# Patient Record
Sex: Male | Born: 1945
Health system: Southern US, Community
[De-identification: ages and names within clinical notes are randomized; demographics above are authoritative.]

## PROBLEM LIST (undated history)

## (undated) DIAGNOSIS — E8809 Other disorders of plasma-protein metabolism, not elsewhere classified: Secondary | ICD-10-CM

## (undated) DIAGNOSIS — E079 Disorder of thyroid, unspecified: Secondary | ICD-10-CM

## (undated) DIAGNOSIS — T7840XA Allergy, unspecified, initial encounter: Secondary | ICD-10-CM

## (undated) DIAGNOSIS — I341 Nonrheumatic mitral (valve) prolapse: Secondary | ICD-10-CM

## (undated) DIAGNOSIS — I480 Paroxysmal atrial fibrillation: Secondary | ICD-10-CM

## (undated) DIAGNOSIS — M1990 Unspecified osteoarthritis, unspecified site: Secondary | ICD-10-CM

## (undated) DIAGNOSIS — F32A Depression, unspecified: Secondary | ICD-10-CM

## (undated) DIAGNOSIS — E039 Hypothyroidism, unspecified: Secondary | ICD-10-CM

## (undated) DIAGNOSIS — E785 Hyperlipidemia, unspecified: Secondary | ICD-10-CM

## (undated) DIAGNOSIS — C9000 Multiple myeloma not having achieved remission: Secondary | ICD-10-CM

## (undated) DIAGNOSIS — I10 Essential (primary) hypertension: Secondary | ICD-10-CM

## (undated) DIAGNOSIS — N289 Disorder of kidney and ureter, unspecified: Secondary | ICD-10-CM

## (undated) DIAGNOSIS — F329 Major depressive disorder, single episode, unspecified: Secondary | ICD-10-CM

## (undated) DIAGNOSIS — R011 Cardiac murmur, unspecified: Secondary | ICD-10-CM

## (undated) DIAGNOSIS — K635 Polyp of colon: Secondary | ICD-10-CM

## (undated) DIAGNOSIS — F419 Anxiety disorder, unspecified: Secondary | ICD-10-CM

## (undated) HISTORY — DX: Hyperlipidemia, unspecified: E78.5

## (undated) HISTORY — DX: Polyp of colon: K63.5

## (undated) HISTORY — DX: Cardiac murmur, unspecified: R01.1

## (undated) HISTORY — DX: Multiple myeloma not having achieved remission: C90.00

## (undated) HISTORY — DX: Allergy, unspecified, initial encounter: T78.40XA

## (undated) HISTORY — DX: Other disorders of plasma-protein metabolism, not elsewhere classified: E88.09

## (undated) HISTORY — DX: Anxiety disorder, unspecified: F41.9

## (undated) HISTORY — DX: Disorder of thyroid, unspecified: E07.9

## (undated) HISTORY — DX: Unspecified osteoarthritis, unspecified site: M19.90

## (undated) HISTORY — DX: Essential (primary) hypertension: I10

## (undated) HISTORY — DX: Hypothyroidism, unspecified: E03.9

## (undated) HISTORY — DX: Paroxysmal atrial fibrillation: I48.0

## (undated) HISTORY — DX: Major depressive disorder, single episode, unspecified: F32.9

## (undated) HISTORY — DX: Depression, unspecified: F32.A

## (undated) HISTORY — DX: Disorder of kidney and ureter, unspecified: N28.9

## (undated) HISTORY — DX: Nonrheumatic mitral (valve) prolapse: I34.1

---

## 2001-01-05 ENCOUNTER — Emergency Department (HOSPITAL_COMMUNITY): Admission: EM | Admit: 2001-01-05 | Discharge: 2001-01-05 | Payer: Self-pay | Admitting: *Deleted

## 2007-09-15 ENCOUNTER — Ambulatory Visit: Payer: Self-pay | Admitting: Internal Medicine

## 2007-09-28 ENCOUNTER — Encounter: Payer: Self-pay | Admitting: Internal Medicine

## 2007-09-28 ENCOUNTER — Ambulatory Visit: Payer: Self-pay | Admitting: Internal Medicine

## 2008-08-30 ENCOUNTER — Ambulatory Visit: Payer: Self-pay | Admitting: Radiology

## 2008-08-30 ENCOUNTER — Inpatient Hospital Stay (HOSPITAL_COMMUNITY): Admission: AD | Admit: 2008-08-30 | Discharge: 2008-08-31 | Payer: Self-pay | Admitting: Internal Medicine

## 2008-08-30 ENCOUNTER — Encounter: Payer: Self-pay | Admitting: Emergency Medicine

## 2011-01-21 NOTE — Discharge Summary (Signed)
NAME:  Darren Hinton, Darren Hinton NO.:  [id number]   MEDICAL RECORD NO.:  [id number]          PATIENT TYPE:  INP   LOCATION:  3707                         FACILITY:  [facility]   PHYSICIAN:  Eden Lathe. Einar Gip, MD       DATE OF BIRTH:  05-01-1946   DATE OF ADMISSION:  08/30/2008  DATE OF DISCHARGE:  08/31/2008                               DISCHARGE SUMMARY   DISCHARGE DIAGNOSES:  1. Paroxysmal atrial fibrillation with rapid ventricular response      converted to normal sinus rhythm after intravenous Cardizem.  2. History of hypertension.  3. History of hyperlipidemia.  4. History of hyperthyroidism apparently treated and now on      hypothyroidism medications.  5. Suspected obstructive sleep apnea.   LABORATORY DATA:  Hemoglobin 12.2, hematocrit 37.2, WBC 5.9 and  platelets 256.  Total cholesterol is 160, triglycerides 49, HDL 48 and  LDL 102.  CK-MBs and troponin were negative x2 and point-of-care marker  was negative x1.  Magnesium was 2.1.  Sodium 139, potassium 4.1,  chloride 106, CO2 28, glucose 104, BUN 12, creatinine 1.43.  AST was 22,  ALT was 17, total protein 6.8.  T4 was 1.11 and TSH was 0.549.  Urine  was negative.  INR was 0.9.  Chest x-ray showed no acute chest findings  and no CHF.   DISCHARGE MEDICATIONS:  1. Synthroid 0.125 mg everyday.  2. Simvastatin 40 mg at bedtime daily.  3. Warfarin 5 mg daily.  He should take about 6 p.m. nightly.  4. Diltiazem ER 180 mg a day.  5. Metoprolol succinate 25 mg a day.  6. Aspirin 81 mg two per day.   OTHER DISCHARGE INSTRUCTIONS:  Our office will call him with an  appointment for treadmill Myoview test.  He should hold his diltiazem  and metoprolol on the day of the test.  Dr. Shon Baton office will call  him with an appointment to have his blood checked for his Coumadin  dosing.  They should call him on Monday if not he will call their  office.  He can go back to his regular activity and he should be on low-  sodium  heart-healthy diet.   HOSPITAL COURSE:  Darren Hinton is a 65 year old African American male  patient of Dr. Leanna Battles.  He has seen Dr. Shelva Majestic in the  past last time in the year 2000.  He has no known heart disease.  He  apparently was having palpitations at work.  He has had these in the  past but they were only fleeting in the past that would go away after a  few minutes.  He would have them every 2 or 3 weeks.  However, on the  day of admission, he had the palpitations and they continued and they  did not stop.  He denied any chest pain associated with this or  shortness of breath but he did have a lot of anxiety related to the  palpitations.  I believe he went to an urgent care facility and then was  transferred to [facility].  He was found to be in atrial fib with a  rapid ventricular response.  He converted with a Cardizem drip.  We were  called to see him on the following day because of potential AV block  associated with the diltiazem drip.  However, we did not see any strips  protruding this.  He was in sinus rhythm with some PACs.  Dr. Einar Gip  spoke with Dr. Osborne Casco who was rounding on him the morning of August 31, 2008.  His CK-MBs and troponins were all negative.  He was  considered to be stable to discharge home.  We altered his medications  in fact we put him on diltiazem ER and metoprolol and discontinued his  Diovan HCT he had been on previously for his hypertension.  It was  thought he could be worked up as an outpatient for any ischemia related  to his PAF by doing a Myoview as an outpatient to rule out coronary  artery disease.  He will follow with Dr. Philip Aspen for his Coumadin  dosing and further recommendations.  We will follow after he is seen by  Dr. Einar Gip after his Myoview test.  He was given teaching on Coumadin  medication and paroxysmal atrial fibrillation  prior to his discharge.      Cyndia Bent, N.P.      Eden Lathe. Einar Gip, MD   Electronically Signed    BB/MEDQ  D:  08/31/2008  T:  08/31/2008  Job:  [id number]

## 2011-01-21 NOTE — Discharge Summary (Signed)
Darren Hinton, Darren Hinton NO.:  [id number]   MEDICAL RECORD NO.:  [id number]          PATIENT TYPE:  INP   LOCATION:  3707                         FACILITY:  [facility]   PHYSICIAN:  Haywood Pao, M.D.DATE OF BIRTH:  1945-11-16   DATE OF ADMISSION:  08/30/2008  DATE OF DISCHARGE:  08/31/2008                               DISCHARGE SUMMARY   FINAL DIAGNOSES:  1. Paroxysmal atrial fibrillation, now converted to normal sinus      rhythm with premature atrial contractions.  2. Hypertension, well controlled.  3. Hyperlipidemia.   MEDICATIONS ON DISCHARGE:  1. Aspirin 81 mg per day.  2. Zocor 20 mg p.o. nightly.  3. Coumadin 5 mg p.o. daily.  4. Cardizem CD 120 mg p.o. daily.   LABORATORY TESTS:  White count 5.9, hemoglobin 12.2, hematocrit 37.2,  platelets 286.  Cholesterol of 160 with an LDL of 102, HDL 48,  triglycerides 49.  Cardiac enzymes are negative.  LFTs and BMET are  normal.  Glucose slightly elevated at 104.  Urinalysis negative.  Free  T4 1.11, TSH 0.5.   PHYSICAL EXAMINATION:  VITAL SIGNS:  Blood pressure 103/60, heart rate  61, respiratory rate 18, temperature 97.2, satting 97% on room air.  GENERAL:  No acute distress.  HEENT: Normocephalic, atraumatic.  PERRLA.  EOMI.  ENT is within normal  limits.  NECK:  Supple.  No lymphadenopathy, JVD, or bruit.  HEART:  Regular rate and rhythm with occasional PACs.  LUNGS:  Clear to auscultation bilaterally.  ABDOMEN:  Soft, nontender, normoactive bowel sounds.  EXTREMITIES: No clubbing, cyanosis, or edema.  NEURO:  The patient is oriented x3 and intact otherwise.   HOSPITAL COURSE:  The patient was admitted through the [facility]  Emergency Room by the hospitalist team and was transferred to our care  the morning of August 31, 2008.  The patient was admitted with  paroxysmal atrial fibrillation, was converted over to normal sinus  rhythm with IV Cardizem.  Cardiology consultation was undertaken per  Dr.  Einar Gip at [facility] as the patient has seen Dr.  Claiborne Billings in the distant past, remained asymptomatic during his hospital  stay.  Cardiologic consultation indicated that the patient would require  an echocardiogram, but this could be best performed as an outpatient  given that he is getting into the holiday and was agreed that the  patient is most likely having episodes of these outside and more  frequently than just once and was started on Coumadin per Cardiology as  well.  The patient was converted from IV to p.o. Cardizem and is being  discharged in good condition.   FOLLOW UP:  The patient is to follow up with Dr. Shon Baton Coumad[facility]  [facility] at [facility] on Monday, September 04, 2008, this  will be arranged the morning of the September 04, 2008, when the office  is opened and the patient will be called for an appointment.  We will  continue to manage his Coumadin [facility] and has set the patient up for an outpatient workup  for  echocardiography and to return to their care.   CONDITION ON DISCHARGE:  Stable.      Haywood Pao, M.D.  Electronically Signed     RWT/MEDQ  D:  08/31/2008  T:  08/31/2008  Job:  [id number]   cc:   Eden Lathe. Einar Gip, MD

## 2011-01-21 NOTE — H&P (Signed)
NAME:  Darren Hinton, Darren Hinton NO.:  [id number]   MEDICAL RECORD NO.:  [id number]          PATIENT TYPE:  INP   LOCATION:  3707                         FACILITY:  [facility]   PHYSICIAN:  Cherene Altes, M.D.DATE OF BIRTH:  [birth year]   DATE OF ADMISSION:  08/30/2008  DATE OF DISCHARGE:                              HISTORY & PHYSICAL   PRIMARY CARE PHYSICIAN:  Ermalene Searing. Philip Aspen, M.D.   CHIEF COMPLAINT:  Palpitations.   HISTORY OF PRESENT ILLNESS:  Mr. Darren Hinton is a very pleasant 65-  year-old gentleman who was in his usual state of health until today.  He  began to experience the acute onset of palpitations at work.  He admits  that he has had very short-lived, literally two to three beat  palpitations for many months if not greater than a year in the past.  He  states that these episodes, however typically abruptly resolved and then  he has no further difficulty.  These episodes have never been  accompanied by chest pain or shortness of breath.  He further admits  that they are usually due to periods of severe stress.  Today he was  at work and admittedly under stress when he experienced the onset of his  typical sensation of palpitations.  Today was different however, in that  his symptoms did not resolve after three to four beats.  In fact his  symptoms persisted.  He did not experience chest pain, nausea, vomiting,  headache, diaphoresis, shortness of breath or any acute neurologic  deficit associated with his palpitations.  Nonetheless in that his  symptoms persisted he went to find the company nurse.  Ultimately EMS  was summoned and the patient was transferred to the [facility] area emergency room.  There the patient was found to be in atrial  fibrillation, and a Cardizem drip was initiated.  [facility] was called  to arrange for direct hospital admission in that the ER doctor did not  recognize that the patient was a regular patient of Dr.  Philip Aspen.   REVIEW OF SYSTEMS:  Comprehensive review of systems is unremarkable with  exception of the positive elements noted in the history of present  illness above.   PAST MEDICAL HISTORY:  1. Hypertension.  2. Hypercholesterolemia.  3. Hypothyroidism.  4. No major surgeries.  5. No previous hospitalizations.   OUTPATIENT MEDICATIONS:  Dosages unknown.  1. Diet and  2. Synthroid.  3. Zocor.   ALLERGIES:  No known drug allergies.   FAMILY HISTORY:  The patient's mother has passed away with complications  from renal failure and was on dialysis.  The patient's father is alive  but is suffering with complications of prostate cancer.   SOCIAL HISTORY:  The patient is married.  He has two grown children.  He  works at [employer] in [place].  He does not smoke and he occasionally  partakes of alcohol, but not to excess.   DATA REVIEWED:  CBC is unremarkable with exception of very mild anemia  with a hemoglobin of 12.9 and an MCV of 94.  BMET is unrevealing.  Coags  are normal.  Point of care cardiac markers are negative x1.  Urinalysis  reveals 30 protein but is otherwise unrevealing.  Chest x-ray reveals no  acute disease.  EKG confirms atrial fibrillation at 166 beats per  minute.  TSH is 0.55 with a free T4 of 1.11.   PHYSICAL EXAMINATION:  Temperature afebrile.  Blood pressure 132/81,  heart rate 76-100.  respiratory rate 16, O2 sats 90% on room air.  GENERAL:  Well-developed, well-nourished gentleman in no acute  respiratory distress.  HEENT:  Normocephalic, atraumatic.  Pupils equal, round, react to light  accommodation.  Extraocular muscles intact bilaterally.  OC/OP clear.  NECK:  No JVD.  LUNGS:  Clear to auscultation bilaterally without wheezes or rhonchi.  CARDIOVASCULAR:  Irregularly irregular without gallop or rub with normal  S1 and S2 and no appreciable murmur.  ABDOMEN:  Nontender, nondistended, soft bowel sounds present.  No  organomegaly or rebound.  No  ascites.  EXTREMITIES:  No significant cyanosis, clubbing, edema in bilateral  lower extremities.  NEUROLOGIC:  5/5 strength bilateral upper and extremities.  Intact  sensation to touch throughout.  Cranial nerves II-XII intact  bilaterally.  Alert and oriented x4.   IMPRESSION AND PLAN:  1. Newly diagnosed atrial fibrillation.  I will place the patient on      IV heparin.  We will monitor cardiac enzymes overnight.  We will      monitor the patient on telemetry.  We will continue Cardizem as the      patient appears to be responding to it well at the present time.      It is my suspicion that the patient has been suffering with      paroxysmal atrial fibrillation in the outpatient setting for quite      some time based upon his symptom complex.  This opinion is      strengthened by the fact that the patient was aware of his symptoms      today and states that they felt exactly the same as he has had in      the past.  The only difference is the persistence.  As a result, it      is not likely that he would be a good candidate for acute      cardioversion.  Nonetheless, if the patient remains in atrial      fibrillation overnight we will consider further evaluation with a      Cardiology consultation.  Likewise we will consider initiating      Coumadin therapy if his atrial fibrillation persist throughout the      night.  A case could be made to initiate this regardless, once it      is confirmed that he will not require any intervention, as the      patient is likely describing paroxysmal atrial fibrillation of      greater than one year's duration.  Fortunately he has no symptoms      to suggest angina.  He has never undergone risk stratification,      however and certainly once this is under control an appropriate      evaluation for such would also be wise.  2. Hypertension.  At the present time the patient's blood pressure is      well-controlled.  We will hold his Diovan as we  are using IV      Diltiazem.  We will follow his blood  pressure trend closely.  3. Hypercholesterolemia.  We will continue the patient's Zocor and we      will recheck fasting lipid panel in the morning.  4. Hypothyroidism.  The patient is on Synthroid.  His TSH is 0.55.  We      certainly could consider slightly decreasing his Synthroid dose      with a goal TSH of approximately 1.0, but I am not sufficiently      convinced that the patient's atrial fibrillation is a reaction to      his Synthroid therapy.   As noted above, it was discovered at the time of the patient's admission  at [facility] that he actually is followed by Dr. Janie Morning with  [facility].  I have contacted the on-call partner for  Dr. Philip Aspen tonight and they have agreed to assume care of the patient  in the morning.      Cherene Altes, M.D.  Electronically Signed     JTM/MEDQ  D:  08/30/2008  T:  08/30/2008  Job:  [id number]   cc:   Ermalene Searing. Philip Aspen, M.D.

## 2011-06-13 LAB — BASIC METABOLIC PANEL
Chloride: 102 mEq/L (ref 96–112)
GFR calc Af Amer: 57 mL/min — ABNORMAL LOW (ref >60)
GFR calc non Af Amer: 47 mL/min — ABNORMAL LOW (ref >60)
Potassium: 4.3 mEq/L (ref 3.5–5.1)
Sodium: 140 mEq/L (ref 135–145)

## 2011-06-13 LAB — URINALYSIS, ROUTINE W REFLEX MICROSCOPIC
Bilirubin Urine: NEGATIVE
Ketones, ur: NEGATIVE mg/dL
Leukocytes, UA: NEGATIVE
Nitrite: NEGATIVE
Specific Gravity, Urine: 1.022 (ref 1.005–1.030)
Urobilinogen, UA: 1 mg/dL (ref 0.0–1.0)
pH: 6.5 (ref 5.0–8.0)

## 2011-06-13 LAB — COMPREHENSIVE METABOLIC PANEL
AST: 22 U/L (ref 0–37)
Albumin: 3.7 g/dL (ref 3.5–5.2)
BUN: 12 mg/dL (ref 6–23)
Calcium: 8.9 mg/dL (ref 8.4–10.5)
Chloride: 106 mEq/L (ref 96–112)
Creatinine, Ser: 1.43 mg/dL (ref 0.4–1.5)
GFR calc Af Amer: >60 mL/min (ref >60)
GFR calc non Af Amer: 50 mL/min — ABNORMAL LOW (ref >60)
Total Bilirubin: 1.2 mg/dL (ref 0.3–1.2)

## 2011-06-13 LAB — CBC
HCT: 38.8 % — ABNORMAL LOW (ref 39.0–52.0)
Hemoglobin: 12.9 g/dL — ABNORMAL LOW (ref 13.0–17.0)
MCHC: 32.9 g/dL (ref 30.0–36.0)
MCV: 94.2 fL (ref 78.0–100.0)
Platelets: 266 10*3/uL (ref 150–400)
RBC: 3.95 MIL/uL — ABNORMAL LOW (ref 4.22–5.81)
RBC: 4.13 MIL/uL — ABNORMAL LOW (ref 4.22–5.81)
RDW: 14.5 % (ref 11.5–15.5)
WBC: 5.9 10*3/uL (ref 4.0–10.5)

## 2011-06-13 LAB — LIPID PANEL
LDL Cholesterol: 102 mg/dL — ABNORMAL HIGH (ref 0–99)
Total CHOL/HDL Ratio: 3.3 RATIO
Triglycerides: 49 mg/dL (ref <150)
VLDL: 10 mg/dL (ref 0–40)

## 2011-06-13 LAB — POCT CARDIAC MARKERS
CKMB, poc: 1.6 ng/mL (ref 1.0–8.0)
Myoglobin, poc: 103 ng/mL (ref 12–200)
Myoglobin, poc: 135 ng/mL (ref 12–200)
Troponin i, poc: <0.05 ng/mL (ref 0.00–0.09)

## 2011-06-13 LAB — DIFFERENTIAL
Eosinophils Absolute: 0.3 10*3/uL (ref 0.0–0.7)
Eosinophils Relative: 6 % — ABNORMAL HIGH (ref 0–5)
Lymphocytes Relative: 31 % (ref 12–46)
Lymphs Abs: 1.8 10*3/uL (ref 0.7–4.0)
Monocytes Relative: 11 % (ref 3–12)

## 2011-06-13 LAB — CARDIAC PANEL(CRET KIN+CKTOT+MB+TROPI)
Relative Index: 0.8 (ref 0.0–2.5)
Relative Index: 0.9 (ref 0.0–2.5)
Total CK: 173 U/L (ref 7–232)
Troponin I: 0.01 ng/mL (ref 0.00–0.06)

## 2011-06-13 LAB — T4, FREE: Free T4: 1.11 ng/dL (ref 0.89–1.80)

## 2011-06-13 LAB — URINE MICROSCOPIC-ADD ON

## 2011-06-13 LAB — HEPARIN LEVEL (UNFRACTIONATED): Heparin Unfractionated: 0.57 IU/mL (ref 0.30–0.70)

## 2011-06-13 LAB — TSH: TSH: 0.549 u[IU]/mL (ref 0.350–4.500)

## 2011-06-13 LAB — APTT: aPTT: 26 seconds (ref 24–37)

## 2011-06-13 LAB — URINE CULTURE: Colony Count: NO GROWTH

## 2011-09-29 ENCOUNTER — Telehealth: Payer: Self-pay | Admitting: Internal Medicine

## 2011-09-29 NOTE — Telephone Encounter (Signed)
l/m on h# to call for new pt appt  aom

## 2011-09-29 NOTE — Telephone Encounter (Signed)
Chart Del.

## 2011-09-29 NOTE — Telephone Encounter (Signed)
RECEIVED REFERRAL

## 2011-09-29 NOTE — Telephone Encounter (Signed)
Wife called  back and appt was made for 1/28 9:00   aom

## 2011-09-29 NOTE — Telephone Encounter (Signed)
DEL

## 2011-10-02 ENCOUNTER — Encounter: Payer: Self-pay | Admitting: Internal Medicine

## 2011-10-06 ENCOUNTER — Ambulatory Visit: Payer: BC Managed Care – PPO

## 2011-10-06 ENCOUNTER — Encounter: Payer: Self-pay | Admitting: Internal Medicine

## 2011-10-06 ENCOUNTER — Ambulatory Visit (HOSPITAL_BASED_OUTPATIENT_CLINIC_OR_DEPARTMENT_OTHER): Payer: BC Managed Care – PPO | Admitting: Internal Medicine

## 2011-10-06 ENCOUNTER — Telehealth: Payer: Self-pay | Admitting: Internal Medicine

## 2011-10-06 ENCOUNTER — Other Ambulatory Visit (HOSPITAL_BASED_OUTPATIENT_CLINIC_OR_DEPARTMENT_OTHER): Payer: BC Managed Care – PPO | Admitting: Lab

## 2011-10-06 VITALS — BP 135/69 | HR 57 | Temp 98.8°F | Ht 77.5 in | Wt 227.1 lb

## 2011-10-06 DIAGNOSIS — D472 Monoclonal gammopathy: Secondary | ICD-10-CM

## 2011-10-06 LAB — CBC WITH DIFFERENTIAL/PLATELET
BASO%: 0.4 % (ref 0.0–2.0)
MCHC: 33.4 g/dL (ref 32.0–36.0)
MONO#: 0.5 10*3/uL (ref 0.1–0.9)
RBC: 3.82 10*6/uL — ABNORMAL LOW (ref 4.20–5.82)
RDW: 15.4 % — ABNORMAL HIGH (ref 11.0–14.6)
WBC: 4.9 10*3/uL (ref 4.0–10.3)
lymph#: 1.2 10*3/uL (ref 0.9–3.3)

## 2011-10-06 NOTE — Progress Notes (Signed)
Darren Hinton  REASON FOR CONSULTATION:  66 years old [name] with monoclonal paraproteinemia  HPI Darren Hinton is a 66 y.o. male with past medical history significant for hypertension, dyslipidemia, hypothyroidism, atrial ventricular fibrillation. The patient was seen by his primary care physician Dr. Sharlett Hinton recently for routine annual physical exam. Comprehensive metabolic panel showed an elevated total protein of 8.9. 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 The patient was referred to me today for evaluation and recommendation regarding this abnormality. He is feeling fine and he denied having any significant complaints. He has no weight loss or night sweats. No chest pain or shortness of breath, no cough or hemoptysis, no change in bowel movement, no bony pains. He has no bleeding issues or recurrent infection. Has no family history of multiple myeloma or other malignancy except for prostate cancer in his father. He is married with a remote history of smoking. The patient works as a Hospital doctor for [employer]. @SFHPI @  Past Medical History  Diagnosis Date  . Thyroid disease   . Hyperlipidemia   . Hypothyroidism   . Paroxysmal atrial fibrillation   . Mitral valve prolapse   . Hyperplastic colon polyp   . Hypertension   . Hyperproteinemia     No past surgical history on file.  Family History  Problem Relation Age of Onset  . Cancer Father     Social History History  Substance Use Topics  . Smoking status: Former Smoker -- 20 years    Types: Cigarettes    Quit date: 10/01/1978  . Smokeless tobacco: Not on file  . Alcohol Use: 1.2 oz/week    2 Glasses of wine per week    No Known Allergies  Current Outpatient Prescriptions  Medication Sig Dispense Refill  .  ALPRAZolam (XANAX) 0.25 MG tablet Take 0.25 mg by mouth at bedtime as needed.      Marland Kitchen aspirin 325 MG tablet Take 325 mg by mouth daily.      Marland Kitchen diltiazem (DILACOR XR) 180 MG 24 hr capsule Take 180 mg by mouth daily.      Marland Kitchen levothyroxine (SYNTHROID, LEVOTHROID) 125 MCG tablet Take 125 mcg by mouth daily.      . metoprolol succinate (TOPROL-XL) 50 MG 24 hr tablet Take 50 mg by mouth 2 (two) times daily before a meal. Take with or immediately following a meal.      . propranolol (INDERAL) 10 MG tablet Take 10 mg by mouth as needed. Prior to dental surgery for anxiety      . simvastatin (ZOCOR) 40 MG tablet Take 40 mg by mouth every evening.      . cromolyn (NASALCROM) 5.2 MG/ACT nasal spray Place 1 spray into the nose 4 (four) times daily.      . fluticasone (FLONASE) 50 MCG/ACT nasal spray Place 2 sprays into the nose daily.      Marland Kitchen ipratropium (ATROVENT) 0.06 % nasal spray Place 2 sprays into the nose 3 (three) times daily.        Review of Systems  A comprehensive review of systems was negative.  Physical Exam  [id number], healthy, no distress, well nourished and well developed SKIN: skin color, texture, turgor are normal HEAD: Normocephalic, No masses, lesions, tenderness or abnormalities EYES: normal, PERRLA EARS: External ears normal OROPHARYNX:no exudate, no erythema and lips,  buccal mucosa, and tongue normal  NECK: supple, no adenopathy LYMPH:  no palpable lymphadenopathy, no hepatosplenomegaly LUNGS: clear to auscultation  HEART: regular rate & rhythm, no murmurs and no gallops ABDOMEN:abdomen soft, non-tender, normal bowel sounds and no masses or organomegaly BACK: Back symmetric, no curvature. EXTREMITIES:no joint deformities, effusion, or inflammation, no edema, no skin discoloration, no clubbing, no cyanosis  NEURO: alert & oriented x 3 with fluent speech, no focal motor/sensory deficits, gait normal    Studies/Results: No results found.   ASSESSMENT: This is a very  pleasant 66 years old American male presented with a monoclonal gammopathy to rule out multiple myeloma. I have a lengthy discussion with the patient and his wife Darren Hinton today about his condition and further investigation to confirm or rule out multiple myeloma.  PLAN: #1 I ordered several studies today including repeat CBC, comprehensive metabolic panel, LDH and myeloma panel. These results still pending. I would see the patient back for followup visit in 2 weeks for evaluation and discussion of his lab results. #2 If the lab results are concerning for multiple myeloma I would consider the patient for a bone marrow biopsy and aspirate in addition to a skeletal bone survey.  I gave the patient and his wife the time to ask questions and I answered them completely to their satisfactions.  All questions were answered. The patient knows to call the clinic with any problems, questions or concerns. We can certainly see the patient much sooner if necessary.  Thank you so much for allowing me to participate in the care of Darren Hinton. I will continue to follow up the patient with you and assist in his care.  I spent 25 minutes counseling the patient face to face. The total time spent in the appointment was 55 minutes.   [name]. 10/06/2011, 10:35 AM

## 2011-10-06 NOTE — Telephone Encounter (Signed)
Pt sent back to the lab and appt made for 2/12 and printed for pt  aom

## 2011-10-08 LAB — COMPREHENSIVE METABOLIC PANEL
ALT: 10 U/L (ref 0–53)
AST: 24 U/L (ref 0–37)
CO2: 26 mEq/L (ref 19–32)
Calcium: 9.4 mg/dL (ref 8.4–10.5)
Chloride: 106 mEq/L (ref 96–112)
Sodium: 140 mEq/L (ref 135–145)
Total Bilirubin: 0.5 mg/dL (ref 0.3–1.2)
Total Protein: 7.6 g/dL (ref 6.0–8.3)

## 2011-10-08 LAB — LACTATE DEHYDROGENASE: LDH: 177 U/L (ref 94–250)

## 2011-10-08 LAB — SPEP & IFE WITH QIG
Albumin ELP: 54.7 % — ABNORMAL LOW (ref 55.8–66.1)
Alpha-2-Globulin: 9.4 % (ref 7.1–11.8)
Beta 2: 3.7 % (ref 3.2–6.5)
Beta Globulin: 5.9 % (ref 4.7–7.2)
IgA: 1110 mg/dL — ABNORMAL HIGH (ref 68–379)
M-Spike, %: 0.97 g/dL
Total Protein, Serum Electrophoresis: 7.6 g/dL (ref 6.0–8.3)

## 2011-10-08 LAB — KAPPA/LAMBDA LIGHT CHAINS: Kappa:Lambda Ratio: 0.33 (ref 0.26–1.65)

## 2011-10-21 ENCOUNTER — Ambulatory Visit (HOSPITAL_BASED_OUTPATIENT_CLINIC_OR_DEPARTMENT_OTHER): Payer: BC Managed Care – PPO | Admitting: Internal Medicine

## 2011-10-21 VITALS — BP 158/89 | HR 55 | Temp 97.7°F | Ht 77.5 in | Wt 226.5 lb

## 2011-10-21 DIAGNOSIS — D472 Monoclonal gammopathy: Secondary | ICD-10-CM

## 2011-10-21 NOTE — Progress Notes (Signed)
FLOW, medical day called regarding BMBX on 11/14/11.  Sign and Hold order in place for BMBX.  Pt instructions also given to pt regarding locaton, date, time.

## 2011-10-21 NOTE — Progress Notes (Signed)
[facility] OFFICE PROGRESS NOTE  [name], MD, MD [place], [place]. [place] [address]  DIAGNOSIS: Monoclonal paraproteinemia suspicious for multiple myeloma  PRIOR THERAPY: None  CURRENT THERAPY: None  INTERVAL HISTORY: Darren Hinton 66 y.o. male returns to the clinic today for followup visit accompanied his wife. The patient was seen for initial evaluation on 10/06/2011. I ordered several studies to evaluate the monoclonal paraproteinemia including beta-2 microglobulin which was elevated at 1.99, IgG was 1020, IgA 1110, IgM 30, free kappa light chain 1.25, free lambda light chains 3.84 was a kappa/lambda ratio of 0.33. The patient is here today for evaluation and discussion of his lab results. He denied having any significant complaints. He denied having any significant weight loss or night sweats. No chest pain or shortness breath, no bleeding issues.  MEDICAL HISTORY: Past Medical History  Diagnosis Date  . Thyroid disease   . Hyperlipidemia   . Hypothyroidism   . Paroxysmal atrial fibrillation   . Mitral valve prolapse   . Hyperplastic colon polyp   . Hypertension   . Hyperproteinemia     ALLERGIES:   has no known allergies.  MEDICATIONS:  Current Outpatient Prescriptions  Medication Sig Dispense Refill  . ALPRAZolam (XANAX) 0.25 MG tablet Take 0.25 mg by mouth at bedtime as needed.      Marland Kitchen aspirin 325 MG tablet Take 325 mg by mouth daily.      . cromolyn (NASALCROM) 5.2 MG/ACT nasal spray Place 1 spray into the nose 4 (four) times daily.      Marland Kitchen diltiazem (DILACOR XR) 180 MG 24 hr capsule Take 180 mg by mouth daily.      . fluticasone (FLONASE) 50 MCG/ACT nasal spray Place 2 sprays into the nose daily.      Marland Kitchen ipratropium (ATROVENT) 0.06 % nasal spray Place 2 sprays into the nose 3 (three) times daily.      Marland Kitchen levothyroxine (SYNTHROID, LEVOTHROID) 125 MCG tablet Take 125 mcg by mouth daily.      .  metoprolol succinate (TOPROL-XL) 50 MG 24 hr tablet Take 50 mg by mouth 2 (two) times daily before a meal. Take with or immediately following a meal.      . propranolol (INDERAL) 10 MG tablet Take 10 mg by mouth as needed. Prior to dental surgery for anxiety      . simvastatin (ZOCOR) 40 MG tablet Take 40 mg by mouth every evening.        REVIEW OF SYSTEMS:  A comprehensive review of systems was negative.   PHYSICAL EXAMINATION: General appearance: alert, cooperative, appears stated age and no distress Neck: no adenopathy Lymph nodes: Cervical, supraclavicular, and axillary nodes normal. Resp: clear to auscultation bilaterally Cardio: regular rate and rhythm, S1, S2 normal, no murmur, click, rub or gallop GI: soft, non-tender; bowel sounds normal; no masses,  no organomegaly Extremities: extremities normal, atraumatic, no cyanosis or edema  ECOG PERFORMANCE STATUS: 0 - Asymptomatic  Blood pressure 158/89, pulse 55, temperature 97.7 F (36.5 C), temperature source Oral, height 6' 5.5" (1.969 m), weight 226 lb 8 oz (102.74 kg).  LABORATORY DATA: Lab Results  Component Value Date   WBC 4.9 10/06/2011   HGB 12.1* 10/06/2011   HCT 36.4* 10/06/2011   MCV 95.3 10/06/2011   PLT 266 10/06/2011      Chemistry      Component Value Date/Time   NA 140 10/06/2011 1052   K  4.7 10/06/2011 1052   CL 106 10/06/2011 1052   CO2 26 10/06/2011 1052   BUN 11 10/06/2011 1052   CREATININE 1.53* 10/06/2011 1052      Component Value Date/Time   CALCIUM 9.4 10/06/2011 1052   ALKPHOS 76 10/06/2011 1052   AST 24 10/06/2011 1052   ALT 10 10/06/2011 1052   BILITOT 0.5 10/06/2011 1052       RADIOGRAPHIC STUDIES: No results found.  ASSESSMENT: This is a very pleasant 66 years old [name] with monoclonal paraproteinemia suspicious for multiple myeloma. I discussed the lab result with the patient and his wife today.  PLAN:  I recommended for him to proceed with a bone marrow biopsy and aspirate to  rule out multiple myeloma. I would also order a skeletal bone survey to evaluate for any bony lytic lesions. I would see the patient back for followup visit in one month's for reevaluation and discussion of his biopsy and imaging studies. The patient agreed to the current plan.  All questions were answered. The patient knows to call the clinic with any problems, questions or concerns. We can certainly see the patient much sooner if necessary.

## 2011-10-28 ENCOUNTER — Ambulatory Visit (HOSPITAL_COMMUNITY)
Admission: RE | Admit: 2011-10-28 | Discharge: 2011-10-28 | Disposition: A | Discharge status: Home / Self Care | Payer: BC Managed Care – PPO | Source: Ambulatory Visit | Attending: Internal Medicine | Admitting: Internal Medicine

## 2011-10-28 DIAGNOSIS — M51379 Other intervertebral disc degeneration, lumbosacral region without mention of lumbar back pain or lower extremity pain: Secondary | ICD-10-CM | POA: Insufficient documentation

## 2011-10-28 DIAGNOSIS — M5030 Other cervical disc degeneration, unspecified cervical region: Secondary | ICD-10-CM | POA: Insufficient documentation

## 2011-10-28 DIAGNOSIS — D472 Monoclonal gammopathy: Secondary | ICD-10-CM | POA: Insufficient documentation

## 2011-10-28 DIAGNOSIS — M5137 Other intervertebral disc degeneration, lumbosacral region: Secondary | ICD-10-CM | POA: Insufficient documentation

## 2011-11-06 ENCOUNTER — Encounter (HOSPITAL_COMMUNITY): Payer: Self-pay | Admitting: Pharmacy Technician

## 2011-11-14 ENCOUNTER — Encounter (HOSPITAL_COMMUNITY): Payer: Self-pay

## 2011-11-14 ENCOUNTER — Ambulatory Visit (HOSPITAL_COMMUNITY)
Admission: RE | Admit: 2011-11-14 | Discharge: 2011-11-14 | Disposition: A | Discharge status: Home / Self Care | Payer: BC Managed Care – PPO | Source: Ambulatory Visit | Attending: Internal Medicine | Admitting: Internal Medicine

## 2011-11-14 DIAGNOSIS — D472 Monoclonal gammopathy: Secondary | ICD-10-CM | POA: Insufficient documentation

## 2011-11-14 LAB — CBC
Hemoglobin: 11.9 g/dL — ABNORMAL LOW (ref 13.0–17.0)
MCH: 30.8 pg (ref 26.0–34.0)
MCV: 95.1 fL (ref 78.0–100.0)
RBC: 3.86 MIL/uL — ABNORMAL LOW (ref 4.22–5.81)

## 2011-11-14 LAB — DIFFERENTIAL
Eosinophils Absolute: 0.5 10*3/uL (ref 0.0–0.7)
Eosinophils Relative: 8 % — ABNORMAL HIGH (ref 0–5)
Lymphs Abs: 2 10*3/uL (ref 0.7–4.0)
Monocytes Relative: 15 % — ABNORMAL HIGH (ref 3–12)

## 2011-11-14 MED ORDER — MEPERIDINE HCL 50 MG/ML IJ SOLN
INTRAMUSCULAR | Status: AC
  Filled 2011-11-14: qty 1

## 2011-11-14 MED ORDER — MIDAZOLAM HCL 10 MG/2ML IJ SOLN
INTRAMUSCULAR | Status: AC
  Administered 2011-11-14: 2 mg
  Filled 2011-11-14: qty 2

## 2011-11-14 MED ORDER — MIDAZOLAM HCL 5 MG/5ML IJ SOLN
INTRAMUSCULAR | Status: AC | PRN
  Administered 2011-11-14 (×2): 2 mg via INTRAVENOUS

## 2011-11-14 MED ORDER — SODIUM CHLORIDE 0.9 % IV SOLN
INTRAVENOUS | Status: DC
  Administered 2011-11-14: 500 mL via INTRAVENOUS

## 2011-11-14 NOTE — ED Notes (Signed)
Patient is resting comfortably. 

## 2011-11-14 NOTE — ED Notes (Signed)
Vital signs stable. 

## 2011-11-14 NOTE — ED Notes (Signed)
Patient denies pain and is resting comfortably.  

## 2011-11-14 NOTE — ED Notes (Signed)
dsg cdi 

## 2011-11-14 NOTE — ED Notes (Signed)
Family updated as to patient's status.

## 2011-11-14 NOTE — Procedures (Signed)
Bone Marrow Biopsy and Aspiration Procedure Note  Mallampati's class: 1 ASA class: 1 Informed consent was obtained and potential risks including bleeding, infection and pain were reviewed with the patient.   Posterior iliac crest(s) prepped with Betadine.   Lidocaine 2% local anesthesia infiltrated into the subcutaneous tissue.  Left bone marrow biopsy and left bone marrow aspirate was obtained.   The procedure was tolerated well and there were no complications.  Specimens sent for: routine histopathologic stains and sectioning, flow cytometry, cytogenetics and molecular analysis  Physician: Eilleen Kempf.

## 2011-11-14 NOTE — ED Notes (Signed)
dsg applied to sacral area per md

## 2011-11-14 NOTE — Progress Notes (Signed)
Driver  Darren Hinton (wife) present  [phone]

## 2011-11-14 NOTE — Sedation Documentation (Signed)
Medication dose calculated and verified for: 6mg  versed

## 2011-11-14 NOTE — ED Notes (Signed)
md has finished  Pt resting  Will continue to monitor

## 2011-11-18 ENCOUNTER — Ambulatory Visit: Payer: BC Managed Care – PPO | Admitting: Internal Medicine

## 2011-11-21 ENCOUNTER — Encounter: Payer: Self-pay | Admitting: Internal Medicine

## 2011-11-24 ENCOUNTER — Ambulatory Visit (HOSPITAL_BASED_OUTPATIENT_CLINIC_OR_DEPARTMENT_OTHER): Payer: BC Managed Care – PPO | Admitting: Internal Medicine

## 2011-11-24 ENCOUNTER — Telehealth: Payer: Self-pay | Admitting: Internal Medicine

## 2011-11-24 ENCOUNTER — Other Ambulatory Visit (HOSPITAL_BASED_OUTPATIENT_CLINIC_OR_DEPARTMENT_OTHER): Payer: BC Managed Care – PPO | Admitting: Lab

## 2011-11-24 VITALS — BP 156/87 | HR 75 | Temp 97.6°F | Ht 77.0 in | Wt 226.4 lb

## 2011-11-24 DIAGNOSIS — D472 Monoclonal gammopathy: Secondary | ICD-10-CM

## 2011-11-24 DIAGNOSIS — E8809 Other disorders of plasma-protein metabolism, not elsewhere classified: Secondary | ICD-10-CM

## 2011-11-24 LAB — CBC WITH DIFFERENTIAL/PLATELET
BASO%: 0.5 % (ref 0.0–2.0)
EOS%: 6.3 % (ref 0.0–7.0)
MCH: 32.1 pg (ref 27.2–33.4)
MCHC: 33.5 g/dL (ref 32.0–36.0)
NEUT%: 53.6 % (ref 39.0–75.0)
RBC: 3.89 10*6/uL — ABNORMAL LOW (ref 4.20–5.82)
RDW: 15.4 % — ABNORMAL HIGH (ref 11.0–14.6)
lymph#: 1.5 10*3/uL (ref 0.9–3.3)

## 2011-11-24 LAB — COMPREHENSIVE METABOLIC PANEL
ALT: 10 U/L (ref 0–53)
AST: 19 U/L (ref 0–37)
Calcium: 9 mg/dL (ref 8.4–10.5)
Chloride: 106 mEq/L (ref 96–112)
Creatinine, Ser: 1.4 mg/dL — ABNORMAL HIGH (ref 0.50–1.35)
Sodium: 141 mEq/L (ref 135–145)

## 2011-11-24 NOTE — Progress Notes (Signed)
[facility] Telephone:(336) [phone]   Fax:(336) [phone]  OFFICE PROGRESS NOTE  Darren Lopes, MD, MD [place], [place]. [place] [address]  DIAGNOSIS: Plasma cell dyscrasia diagnosed in March of 2013  PRIOR THERAPY: None  CURRENT THERAPY: Observation  INTERVAL HISTORY: Darren Hinton 66 y.o. male returns to the clinic today for followup visit accompanied his wife. The patient has no complaints today. He denied having any significant weight loss or night sweats, no bony aches. He has no chest pain or shortness of breath. The patient recently underwent a bone marrow biopsy and aspirate which showed 8% plasma cells suspicious for plasma cell dyscrasia. The patient also has a skeletal bone survey which showed no lytic lesions. He has no evidence for end organ dysfunction related to the plasma cell dyscrasia.  MEDICAL HISTORY: Past Medical History  Diagnosis Date  . Thyroid disease   . Hyperlipidemia   . Hypothyroidism   . Paroxysmal atrial fibrillation   . Mitral valve prolapse   . Hyperplastic colon polyp   . Hypertension   . Hyperproteinemia     ALLERGIES:   has no known allergies.  MEDICATIONS:  Current Outpatient Prescriptions  Medication Sig Dispense Refill  . aspirin 325 MG tablet Take 325 mg by mouth every morning.       . cromolyn (NASALCROM) 5.2 MG/ACT nasal spray Place 1 spray into the nose 3 (three) times daily as needed. Allergies       . diltiazem (DILACOR XR) 180 MG 24 hr capsule Take 180 mg by mouth every morning.       . fluticasone (FLONASE) 50 MCG/ACT nasal spray Place 2 sprays into the nose daily as needed. Allergies       . ipratropium (ATROVENT) 0.06 % nasal spray Place 2 sprays into the nose 3 (three) times daily as needed. Allergies       . levothyroxine (SYNTHROID, LEVOTHROID) 125 MCG tablet Take 125 mcg by mouth every morning.       . metoprolol (LOPRESSOR) 50 MG tablet Take 50 mg by mouth 2  (two) times daily.      . propranolol (INDERAL) 10 MG tablet Take 10 mg by mouth as needed. Prior to dental surgery for anxiety      . simvastatin (ZOCOR) 40 MG tablet Take 40 mg by mouth every morning.       Marland Kitchen ALPRAZolam (XANAX) 0.25 MG tablet Take 0.25 mg by mouth daily as needed. Anxiety         REVIEW OF SYSTEMS:  A comprehensive review of systems was negative.   PHYSICAL EXAMINATION: General appearance: alert, cooperative and no distress Neck: no adenopathy Lymph nodes: Cervical, supraclavicular, and axillary nodes normal. Resp: clear to auscultation bilaterally Cardio: regular rate and rhythm, S1, S2 normal, no murmur, click, rub or gallop GI: soft, non-tender; bowel sounds normal; no masses,  no organomegaly Extremities: extremities normal, atraumatic, no cyanosis or edema  ECOG PERFORMANCE STATUS: 0 - Asymptomatic  Blood pressure 156/87, pulse 75, temperature 97.6 F (36.4 C), temperature source Oral, height 6\' 5"  (1.956 m), weight 226 lb 6.4 oz (102.694 kg).  LABORATORY DATA: Lab Results  Component Value Date   WBC 5.8 11/24/2011   HGB 12.5* 11/24/2011   HCT 37.4* 11/24/2011   MCV 96.0 11/24/2011   PLT 273 11/24/2011      Chemistry      Component Value Date/Time   NA 140 10/06/2011 1052   K 4.7  10/06/2011 1052   CL 106 10/06/2011 1052   CO2 26 10/06/2011 1052   BUN 11 10/06/2011 1052   CREATININE 1.53* 10/06/2011 1052      Component Value Date/Time   CALCIUM 9.4 10/06/2011 1052   ALKPHOS 76 10/06/2011 1052   AST 24 10/06/2011 1052   ALT 10 10/06/2011 1052   BILITOT 0.5 10/06/2011 1052       RADIOGRAPHIC STUDIES: Dg Bone Survey Met  10/28/2011  *RADIOLOGY REPORT*  Clinical Data: Monoclonal gammopathy.  Question myeloma.  METASTATIC BONE SURVEY  Comparison: None.  Findings: Skull:  No acute bony abnormality or focal lytic lesion.  Cervical spine:  Degenerative disc disease and facet disease. Normal alignment.  Prevertebral soft tissues are normal.  No focal lytic lesion  or acute bony abnormality.  Thoracic spine:  Early spurring.  Normal alignment.  No focal lytic lesion or acute bony abnormality.  Lumbar spine:  Degenerative disc and facet disease.  Slight levoscoliosis.  No focal lytic lesion or acute bony abnormality.  Pelvis:  No acute bony abnormality or focal lytic lesion.  Bilateral femurs:  No focal lytic lesion or acute bony abnormality.  Bilateral humeri:  No focal lytic lesion or acute bony abnormality.  IMPRESSION: No acute findings.  No focal lytic lesion.  Original Report Authenticated By: Raelyn Number, M.D.   BONE MARROW REPORT FINAL DIAGNOSIS Diagnosis Bone Marrow, Aspirate,Biopsy, and Clot, left superior posterior iliac - SLIGHTLY HYPERCELLULAR BONE MARROW WITH PLASMA CELL DYSCRASIA (PLASMA CELLS 8%). - TRILINEAGE HEMATOPOIESIS. - DECREASED IRON STORES. - SEE COMMENT. PERIPHERAL BLOOD: - NORMOCYTIC-NORMOCHROMIC ANEMIA. Diagnosis Note The plasma cells represent 8% of all cells in the sample associated with small clusters. Large clusters or sheets of plasma cells are not identified. Immunohistochemical stains show lambda light change restriction consistent with plasma cell dyscrasia. (BNS:eps 11/17/11) (BNS:jy) 11/18/11 [name] Pathologist, Electronic Signature (Case signed 11/18/2011)  ASSESSMENT: This is a very pleasant 66 years old African American male diagnosed with plasma cell dyscrasia suspicious for smoldering multiple myeloma. The patient is doing fine and currently asymptomatic.   PLAN: I have a lengthy discussion with the patient and his wife today about his condition and treatment options. I recommended for him observation for now. I would see him back for followup visit in 3 months with repeat myeloma panel. The patient was advised to call me immediately if he has any concerning symptoms in the interval.  All questions were answered. The patient knows to call the clinic with any problems, questions or concerns. We can  certainly see the patient much sooner if necessary.

## 2011-11-24 NOTE — Telephone Encounter (Signed)
appt made and printed for pt aom

## 2012-02-24 ENCOUNTER — Other Ambulatory Visit: Payer: BC Managed Care – PPO | Admitting: Lab

## 2012-02-24 ENCOUNTER — Ambulatory Visit: Payer: BC Managed Care – PPO | Admitting: Internal Medicine

## 2012-03-25 ENCOUNTER — Telehealth: Payer: Self-pay | Admitting: Internal Medicine

## 2012-03-25 NOTE — Telephone Encounter (Signed)
called pt back as he needed to r/s his appt ,done

## 2012-04-06 ENCOUNTER — Telehealth: Payer: Self-pay | Admitting: Internal Medicine

## 2012-04-06 ENCOUNTER — Ambulatory Visit (HOSPITAL_BASED_OUTPATIENT_CLINIC_OR_DEPARTMENT_OTHER): Payer: BC Managed Care – PPO | Admitting: Internal Medicine

## 2012-04-06 ENCOUNTER — Other Ambulatory Visit (HOSPITAL_BASED_OUTPATIENT_CLINIC_OR_DEPARTMENT_OTHER): Payer: BC Managed Care – PPO

## 2012-04-06 VITALS — BP 148/74 | HR 53 | Temp 98.6°F | Ht 77.0 in | Wt 228.9 lb

## 2012-04-06 DIAGNOSIS — D472 Monoclonal gammopathy: Secondary | ICD-10-CM

## 2012-04-06 DIAGNOSIS — C9000 Multiple myeloma not having achieved remission: Secondary | ICD-10-CM | POA: Insufficient documentation

## 2012-04-06 DIAGNOSIS — E8809 Other disorders of plasma-protein metabolism, not elsewhere classified: Secondary | ICD-10-CM

## 2012-04-06 LAB — CBC WITH DIFFERENTIAL/PLATELET
Basophils Absolute: 0.1 10*3/uL (ref 0.0–0.1)
HCT: 36.6 % — ABNORMAL LOW (ref 38.4–49.9)
HGB: 12.2 g/dL — ABNORMAL LOW (ref 13.0–17.1)
MONO#: 0.6 10*3/uL (ref 0.1–0.9)
NEUT%: 49.8 % (ref 39.0–75.0)
WBC: 4.7 10*3/uL (ref 4.0–10.3)
lymph#: 1.3 10*3/uL (ref 0.9–3.3)

## 2012-04-06 NOTE — Progress Notes (Signed)
[facility] Telephone:(336) [phone]   Fax:(336) [phone]  OFFICE PROGRESS NOTE  [name], MD [place], [place]. [place] [address]  DIAGNOSIS: Plasma cell dyscrasia diagnosed in March of 2013   PRIOR THERAPY: None   CURRENT THERAPY: Observation  INTERVAL HISTORY: RASAAN PHOMMACHANH 66 y.o. male returns to the clinic today for followup visit accompanied by his wife. The patient is feeling fine today with no specific complaints. He denied having any significant weight loss or night sweats. He denied having any chest pain, shortness breath, cough or hemoptysis. The patient denied having any bleeding issues or aching pain. He has repeat CBC, comprehensive metabolic panel, LDH and myeloma panel performed earlier today and he is here for evaluation and discussion of his lab results.  MEDICAL HISTORY: Past Medical History  Diagnosis Date  . Thyroid disease   . Hyperlipidemia   . Hypothyroidism   . Paroxysmal atrial fibrillation   . Mitral valve prolapse   . Hyperplastic colon polyp   . Hypertension   . Hyperproteinemia     ALLERGIES:   has no known allergies.  MEDICATIONS:  Current Outpatient Prescriptions  Medication Sig Dispense Refill  . ALPRAZolam (XANAX) 0.25 MG tablet Take 0.25 mg by mouth daily as needed. Anxiety       . aspirin 325 MG tablet Take 325 mg by mouth every morning.       . cromolyn (NASALCROM) 5.2 MG/ACT nasal spray Place 1 spray into the nose 3 (three) times daily as needed. Allergies       . diltiazem (DILACOR XR) 180 MG 24 hr capsule Take 180 mg by mouth every morning.       . fluticasone (FLONASE) 50 MCG/ACT nasal spray Place 2 sprays into the nose daily as needed. Allergies       . ipratropium (ATROVENT) 0.06 % nasal spray Place 2 sprays into the nose 3 (three) times daily as needed. Allergies       . levothyroxine (SYNTHROID, LEVOTHROID) 125 MCG tablet Take 125 mcg by mouth every morning.        . metoprolol (LOPRESSOR) 50 MG tablet Take 50 mg by mouth 2 (two) times daily.      . propranolol (INDERAL) 10 MG tablet Take 10 mg by mouth as needed. Prior to dental surgery for anxiety      . simvastatin (ZOCOR) 40 MG tablet Take 40 mg by mouth every morning.         REVIEW OF SYSTEMS:  A comprehensive review of systems was negative.   PHYSICAL EXAMINATION: General appearance: alert, cooperative and no distress Head: Normocephalic, without obvious abnormality, atraumatic Neck: no adenopathy Lymph nodes: Cervical, supraclavicular, and axillary nodes normal. Resp: clear to auscultation bilaterally Cardio: regular rate and rhythm, S1, S2 normal, no murmur, click, rub or gallop GI: soft, non-tender; bowel sounds normal; no masses,  no organomegaly Extremities: extremities normal, atraumatic, no cyanosis or edema  ECOG PERFORMANCE STATUS: 0 - Asymptomatic  Blood pressure 148/74, pulse 53, temperature 98.6 F (37 C), temperature source Oral, height 6\' 5"  (1.956 m), weight 228 lb 14.4 oz (103.828 kg).  LABORATORY DATA: Lab Results  Component Value Date   WBC 4.7 04/06/2012   HGB 12.2* 04/06/2012   HCT 36.6* 04/06/2012   MCV 96.3 04/06/2012   PLT 261 04/06/2012      Chemistry      Component Value Date/Time   NA 141 11/24/2011 0911   K 4.0  11/24/2011 0911   CL 106 11/24/2011 0911   CO2 23 11/24/2011 0911   BUN 11 11/24/2011 0911   CREATININE 1.40* 11/24/2011 0911      Component Value Date/Time   CALCIUM 9.0 11/24/2011 0911   ALKPHOS 84 11/24/2011 0911   AST 19 11/24/2011 0911   ALT 10 11/24/2011 0911   BILITOT 0.6 11/24/2011 0911       RADIOGRAPHIC STUDIES: No results found.  ASSESSMENT: This is a very pleasant 66 years old [name] with history of plasma cell dyscrasia currently on observation. The patient is doing fine today and asymptomatic.  PLAN: His CBC today unremarkable except for mild anemia but his myeloma panel is still pending. I recommended for the  patient to continue on observation for now with repeat CBC, comprehensive metabolic panel, LDH and myeloma panel in 6 months. If there is any significant abnormalities in his blood work today I will contact the patient with further recommendations.  He was advised to call me immediately if he has any concerning symptoms in the interval.  All questions were answered. The patient knows to call the clinic with any problems, questions or concerns. We can certainly see the patient much sooner if necessary.

## 2012-04-06 NOTE — Telephone Encounter (Signed)
Gave pt appt for lab and MD January 2014

## 2012-04-07 LAB — COMPREHENSIVE METABOLIC PANEL
AST: 21 U/L (ref 0–37)
BUN: 13 mg/dL (ref 6–23)
CO2: 23 mEq/L (ref 19–32)
Calcium: 8.9 mg/dL (ref 8.4–10.5)
Chloride: 107 mEq/L (ref 96–112)
Creatinine, Ser: 1.42 mg/dL — ABNORMAL HIGH (ref 0.50–1.35)

## 2012-04-07 LAB — KAPPA/LAMBDA LIGHT CHAINS
Kappa free light chain: 1.14 mg/dL (ref 0.33–1.94)
Lambda Free Lght Chn: 3.16 mg/dL — ABNORMAL HIGH (ref 0.57–2.63)

## 2012-10-01 ENCOUNTER — Telehealth: Payer: Self-pay | Admitting: Internal Medicine

## 2012-10-01 ENCOUNTER — Other Ambulatory Visit: Payer: BC Managed Care – PPO | Admitting: Lab

## 2012-10-01 DIAGNOSIS — E8809 Other disorders of plasma-protein metabolism, not elsewhere classified: Secondary | ICD-10-CM

## 2012-10-01 LAB — COMPREHENSIVE METABOLIC PANEL (CC13)
ALT: 16 U/L (ref 0–55)
AST: 24 U/L (ref 5–34)
Albumin: 4.3 g/dL (ref 3.5–5.0)
Alkaline Phosphatase: 102 U/L (ref 40–150)
BUN: 13.1 mg/dL (ref 7.0–26.0)
CO2: 23 meq/L (ref 22–29)
Calcium: 9.4 mg/dL (ref 8.4–10.4)
Chloride: 105 meq/L (ref 98–107)
Creatinine: 1.4 mg/dL — ABNORMAL HIGH (ref 0.7–1.3)
Glucose: 90 mg/dL (ref 70–99)
Potassium: 3.9 meq/L (ref 3.5–5.1)
Sodium: 140 meq/L (ref 136–145)
Total Bilirubin: 0.74 mg/dL (ref 0.20–1.20)
Total Protein: 8.4 g/dL — ABNORMAL HIGH (ref 6.4–8.3)

## 2012-10-01 LAB — CBC WITH DIFFERENTIAL/PLATELET
Eosinophils Absolute: 0.4 10*3/uL (ref 0.0–0.5)
MONO#: 0.7 10*3/uL (ref 0.1–0.9)
MONO%: 14.6 % — ABNORMAL HIGH (ref 0.0–14.0)
NEUT#: 2.3 10*3/uL (ref 1.5–6.5)
RBC: 3.9 10*6/uL — ABNORMAL LOW (ref 4.20–5.82)
RDW: 15.3 % — ABNORMAL HIGH (ref 11.0–14.6)
WBC: 5.1 10*3/uL (ref 4.0–10.3)

## 2012-10-01 LAB — LACTATE DEHYDROGENASE (CC13): LDH: 189 U/L (ref 125–245)

## 2012-10-01 NOTE — Telephone Encounter (Signed)
Pt came by as he was put on the wrong dr sch for next week.  His appt was corrected and reprinted     anne

## 2012-10-04 LAB — IGG, IGA, IGM: IgG (Immunoglobin G), Serum: 1010 mg/dL (ref 650–1600)

## 2012-10-04 LAB — KAPPA/LAMBDA LIGHT CHAINS
Kappa free light chain: 1.49 mg/dL (ref 0.33–1.94)
Kappa:Lambda Ratio: 0.49 (ref 0.26–1.65)

## 2012-10-04 LAB — BETA 2 MICROGLOBULIN, SERUM: Beta-2 Microglobulin: 1.8 mg/L — ABNORMAL HIGH (ref 1.01–1.73)

## 2012-10-05 ENCOUNTER — Ambulatory Visit: Payer: BC Managed Care – PPO | Admitting: Oncology

## 2012-10-07 ENCOUNTER — Telehealth: Payer: Self-pay | Admitting: Internal Medicine

## 2012-10-07 ENCOUNTER — Encounter: Payer: Self-pay | Admitting: Internal Medicine

## 2012-10-07 ENCOUNTER — Ambulatory Visit (HOSPITAL_BASED_OUTPATIENT_CLINIC_OR_DEPARTMENT_OTHER): Payer: BC Managed Care – PPO | Admitting: Internal Medicine

## 2012-10-07 VITALS — BP 157/78 | HR 53 | Temp 96.9°F | Resp 20 | Ht 77.0 in | Wt 232.0 lb

## 2012-10-07 DIAGNOSIS — E8809 Other disorders of plasma-protein metabolism, not elsewhere classified: Secondary | ICD-10-CM

## 2012-10-07 NOTE — Progress Notes (Signed)
[facility] Telephone:(336) [phone]   Fax:(336) [phone]  OFFICE PROGRESS NOTE  [name], MD [place], [place]. [place] [address]  DIAGNOSIS: Plasma cell dyscrasia diagnosed in March of 2013   PRIOR THERAPY: None   CURRENT THERAPY: Observation  INTERVAL HISTORY: Darren Hinton 67 y.o. male returns to the clinic today for six-month followup visit accompanied by his wife. The patient is feeling fine today with no specific complaints. He denied having any significant weight loss or night sweats. He has no chest pain, shortness breath, cough or hemoptysis. He has no bleeding issues. The patient had repeat CBC, comprehensive metabolic panel, LDH and myeloma panel performed recently and he is here today for evaluation and discussion of his scan results.   MEDICAL HISTORY: Past Medical History  Diagnosis Date  . Thyroid disease   . Hyperlipidemia   . Hypothyroidism   . Paroxysmal atrial fibrillation   . Mitral valve prolapse   . Hyperplastic colon polyp   . Hypertension   . Hyperproteinemia     ALLERGIES:   has no known allergies.  MEDICATIONS:  Current Outpatient Prescriptions  Medication Sig Dispense Refill  . aspirin 325 MG tablet Take 325 mg by mouth every morning.       . diltiazem (DILACOR XR) 180 MG 24 hr capsule Take 180 mg by mouth every morning.       Marland Kitchen levothyroxine (SYNTHROID, LEVOTHROID) 125 MCG tablet Take 125 mcg by mouth every morning.       . metoprolol (LOPRESSOR) 50 MG tablet Take 50 mg by mouth 2 (two) times daily.      . propranolol (INDERAL) 10 MG tablet Take 10 mg by mouth as needed. Prior to dental surgery for anxiety      . simvastatin (ZOCOR) 40 MG tablet Take 40 mg by mouth every morning.       Marland Kitchen ALPRAZolam (XANAX) 0.25 MG tablet Take 0.25 mg by mouth daily as needed. Anxiety       . cromolyn (NASALCROM) 5.2 MG/ACT nasal spray Place 1 spray into the nose 3 (three) times daily as needed.  Allergies       . fluticasone (FLONASE) 50 MCG/ACT nasal spray Place 2 sprays into the nose daily as needed. Allergies       . ipratropium (ATROVENT) 0.06 % nasal spray Place 2 sprays into the nose 3 (three) times daily as needed. Allergies         REVIEW OF SYSTEMS:  A comprehensive review of systems was negative.   PHYSICAL EXAMINATION: General appearance: alert, cooperative and no distress Head: Normocephalic, without obvious abnormality, atraumatic Neck: no adenopathy Lymph nodes: Cervical, supraclavicular, and axillary nodes normal. Resp: clear to auscultation bilaterally Cardio: regular rate and rhythm, S1, S2 normal, no murmur, click, rub or gallop GI: soft, non-tender; bowel sounds normal; no masses,  no organomegaly Extremities: extremities normal, atraumatic, no cyanosis or edema  ECOG PERFORMANCE STATUS: 0 - Asymptomatic  Blood pressure 157/78, pulse 53, temperature 96.9 F (36.1 C), temperature source Oral, resp. rate 20, height 6\' 5"  (1.956 m), weight 232 lb (105.235 kg).  LABORATORY DATA: Lab Results  Component Value Date   WBC 5.1 10/01/2012   HGB 12.7* 10/01/2012   HCT 38.4 10/01/2012   MCV 98.3* 10/01/2012   PLT 253 10/01/2012      Chemistry      Component Value Date/Time   NA 140 10/01/2012 0817   NA 140 04/06/2012 0902  K 3.9 10/01/2012 0817   K 4.1 04/06/2012 0902   CL 105 10/01/2012 0817   CL 107 04/06/2012 0902   CO2 23 10/01/2012 0817   CO2 23 04/06/2012 0902   BUN 13.1 10/01/2012 0817   BUN 13 04/06/2012 0902   CREATININE 1.4* 10/01/2012 0817   CREATININE 1.42* 04/06/2012 0902      Component Value Date/Time   CALCIUM 9.4 10/01/2012 0817   CALCIUM 8.9 04/06/2012 0902   ALKPHOS 102 10/01/2012 0817   ALKPHOS 79 04/06/2012 0902   AST 24 10/01/2012 0817   AST 21 04/06/2012 0902   ALT 16 10/01/2012 0817   ALT 14 04/06/2012 0902   BILITOT 0.74 10/01/2012 0817   BILITOT 0.5 04/06/2012 0902       RADIOGRAPHIC STUDIES: No results found.  ASSESSMENT: This is a  very pleasant 67 years old [name] with monoclonal adenopathy of undetermined significance currently on observation. The patient is doing fine and he has no evidence for disease progression on his recent blood work.   PLAN: I discussed the lab result with the patient and his wife. I recommended for him to continue on observation for now with repeat CBC, comprehensive metabolic panel, LDH and myeloma panel in 6 months. The patient come back for followup visit at that time. He was advised to call immediately if he has any concerning symptoms in the interval.  All questions were answered. The patient knows to call the clinic with any problems, questions or concerns. We can certainly see the patient much sooner if necessary.

## 2012-10-07 NOTE — Telephone Encounter (Signed)
appts made and printed for pt aom °

## 2012-10-07 NOTE — Patient Instructions (Signed)
No evidence for disease progression on the myeloma panel. Followup in 6 months with repeat myeloma panel.

## 2013-03-15 ENCOUNTER — Telehealth: Payer: Self-pay | Admitting: Internal Medicine

## 2013-03-15 NOTE — Telephone Encounter (Signed)
s.w. pt wife and advised on 7.28 being moved to 7.30 ok and aware

## 2013-03-20 ENCOUNTER — Ambulatory Visit (INDEPENDENT_AMBULATORY_CARE_PROVIDER_SITE_OTHER): Payer: BC Managed Care – PPO | Admitting: Family Medicine

## 2013-03-20 ENCOUNTER — Other Ambulatory Visit: Payer: Self-pay | Admitting: Family Medicine

## 2013-03-20 VITALS — BP 158/83 | HR 52 | Temp 97.4°F | Resp 16 | Ht 75.5 in | Wt 227.4 lb

## 2013-03-20 DIAGNOSIS — R42 Dizziness and giddiness: Secondary | ICD-10-CM

## 2013-03-20 DIAGNOSIS — H8110 Benign paroxysmal vertigo, unspecified ear: Secondary | ICD-10-CM

## 2013-03-20 DIAGNOSIS — IMO0002 Reserved for concepts with insufficient information to code with codable children: Secondary | ICD-10-CM

## 2013-03-20 LAB — COMPREHENSIVE METABOLIC PANEL
AST: 23 U/L (ref 0–37)
Alkaline Phosphatase: 88 U/L (ref 39–117)
BUN: 10 mg/dL (ref 6–23)
Glucose, Bld: 112 mg/dL — ABNORMAL HIGH (ref 70–99)
Total Bilirubin: 0.5 mg/dL (ref 0.3–1.2)

## 2013-03-20 LAB — POCT CBC
HCT, POC: 42.6 % — AB (ref 43.5–53.7)
Lymph, poc: 1.8 (ref 0.6–3.4)
MCHC: 30.8 g/dL — AB (ref 31.8–35.4)
MCV: 104.7 fL — AB (ref 80–97)
MID (cbc): 0.5 (ref 0–0.9)
POC Granulocyte: 3.2 (ref 2–6.9)
POC LYMPH PERCENT: 32 %L (ref 10–50)
Platelet Count, POC: 305 10*3/uL (ref 142–424)
RDW, POC: 15.3 %

## 2013-03-20 MED ORDER — MECLIZINE HCL 25 MG PO TABS: 25.0000 mg | ORAL_TABLET | Freq: Three times a day (TID) | ORAL | Status: AC | PRN

## 2013-03-20 MED ORDER — PROMETHAZINE HCL 25 MG/ML IJ SOLN
25.0000 mg | Freq: Once | INTRAMUSCULAR | Status: AC
  Administered 2013-03-20: 25 mg via INTRAMUSCULAR

## 2013-03-20 NOTE — Patient Instructions (Addendum)

## 2013-03-20 NOTE — Progress Notes (Signed)
Subjective:    Patient ID: Darren Hinton., male    DOB: Sep 17, 1945, 67 y.o.   MRN: [id number] Chief Complaint  Patient presents with  . Dizziness    this morning    HPI  When Darren Hinton woke up this morning and was still just laying in bed he immed felt out of balance - but no vertigo. Woke up sweating but had blanket on and a.c. was off so could have just been overheated - resolved w/ cold towel. Set on the side of the bed and tried to stand up using the bed post but couldn't. His wife had to help him to the bathroom.  Then just sat on the side of his bed for 30 min but the symptoms continued so came in for eval.  Did have a barbecue sandwhich last night at midnight.  Normally he takes metoprolol at night and forgot last night.   Takes dilt every morning. Did not take his BP pills this morning.  He does not take propranolol - just takes as needed for anxiety before dental visits. Unclear if he had a. Fib in past - initially it was diagnosed but turns out he was just having palpitations due to stress - has had a heart murmur since he was a child. His sxs have continued to improve since he woke up this morning. Now feeling much better. Stomach felt upset and took 2 tums as he was had a "sour belch".  Past Medical History  Diagnosis Date  . Thyroid disease   . Hyperlipidemia   . Hypothyroidism   . Paroxysmal atrial fibrillation   . Mitral valve prolapse   . Hyperplastic colon polyp   . Hypertension   . Hyperproteinemia   . Allergy   . Anxiety   . Heart murmur    Current Outpatient Prescriptions on File Prior to Visit  Medication Sig Dispense Refill  . ALPRAZolam (XANAX) 0.25 MG tablet Take 0.25 mg by mouth daily as needed. Anxiety       . aspirin 325 MG tablet Take 325 mg by mouth every morning.       . cromolyn (NASALCROM) 5.2 MG/ACT nasal spray Place 1 spray into the nose 3 (three) times daily as needed. Allergies       . diltiazem (DILACOR XR) 180 MG 24 hr capsule Take 180  mg by mouth every morning.       . fluticasone (FLONASE) 50 MCG/ACT nasal spray Place 2 sprays into the nose daily as needed. Allergies       . ipratropium (ATROVENT) 0.06 % nasal spray Place 2 sprays into the nose 3 (three) times daily as needed. Allergies       . levothyroxine (SYNTHROID, LEVOTHROID) 125 MCG tablet Take 125 mcg by mouth every morning.       . metoprolol (LOPRESSOR) 50 MG tablet Take 50 mg by mouth 2 (two) times daily.      . propranolol (INDERAL) 10 MG tablet Take 10 mg by mouth as needed. Prior to dental surgery for anxiety      . simvastatin (ZOCOR) 40 MG tablet Take 40 mg by mouth every morning.        No current facility-administered medications on file prior to visit.   No Known Allergies   Review of Systems  Constitutional: Positive for diaphoresis. Negative for fever and chills.  HENT: Negative for hearing loss, ear pain, congestion, rhinorrhea, neck pain, neck stiffness, sinus pressure, tinnitus and ear discharge.   Eyes:  Negative for pain and discharge.  Respiratory: Negative for cough and shortness of breath.   Cardiovascular: Negative for chest pain, palpitations and leg swelling.  Gastrointestinal: Positive for nausea, vomiting and abdominal distention. Negative for abdominal pain.  Skin: Negative for rash.  Neurological: Positive for dizziness and light-headedness. Negative for tremors, syncope, weakness, numbness and headaches.  Hematological: Negative for adenopathy.  Psychiatric/Behavioral: Negative for sleep disturbance.      BP 158/83  Pulse 52  Temp(Src) 97.4 F (36.3 C) (Oral)  Resp 16  Ht 6' 3.5" (1.918 m)  Wt 227 lb 6.4 oz (103.148 kg)  BMI 28.04 kg/m2  SpO2 97% Objective:   Physical Exam  Constitutional: He is oriented to person, place, and time. He appears well-developed and well-nourished. No distress.  HENT:  Head: Normocephalic and atraumatic.  Right Ear: Tympanic membrane, external ear and ear canal normal.  Left Ear: Tympanic  membrane, external ear and ear canal normal.  Nose: Nose normal.  Mouth/Throat: Oropharynx is clear and moist and mucous membranes are normal. No oropharyngeal exudate.  Eyes: Conjunctivae are normal. No scleral icterus.  Neck: Normal range of motion. Neck supple. No thyromegaly present.  Cardiovascular: Normal rate, regular rhythm, S2 normal and intact distal pulses.   Murmur heard.  Decrescendo systolic murmur is present with a grade of 2/6  Pulmonary/Chest: Effort normal and breath sounds normal. No respiratory distress.  Musculoskeletal: He exhibits no edema.  Lymphadenopathy:    He has no cervical adenopathy.  Neurological: He is alert and oriented to person, place, and time. He has normal strength. He displays no atrophy. No cranial nerve deficit or sensory deficit. He exhibits normal muscle tone. He displays a negative Romberg sign. Coordination and gait normal.  has mild nystagmus and vertigo after reclining during Dix-Hallpike to the left but severe symptoms after sitting back up. No sxs after reclining to Dix-Hallpike on the right.  Skin: Skin is warm and dry. He is not diaphoretic. No erythema.  Psychiatric: He has a normal mood and affect. His behavior is normal.      Orthostatics neg EKG: NSR, no ischemic changes. Assessment & Plan:  Dizziness - Plan: EKG 12-Lead, POCT CBC, Comprehensive metabolic panel, TSH, promethazine (PHENERGAN) injection 25 mg  Positional vertigo, unspecified laterality - Suspect BPPV on the left.  However, Dix-Hallpike results not completely typical so rec repeat eval if sxs continue in 2d and consider MRI of head.  Did Epley maneuver on left in office and gave handout so pt can continue at home.  Vertigo caused severe vomiting in office so IM promethazine 25. Cont prn meclizine at home.  Meds ordered this encounter  Medications  . meclizine (ANTIVERT) 25 MG tablet    Sig: Take 1 tablet (25 mg total) by mouth 3 (three) times daily as needed for  dizziness.    Dispense:  30 tablet    Refill:  0  . promethazine (PHENERGAN) injection 25 mg    Sig:

## 2013-03-21 LAB — T3, FREE: T3, Free: 2.8 pg/mL (ref 2.3–4.2)

## 2013-03-21 LAB — T4, FREE: Free T4: 1.29 ng/dL (ref 0.80–1.80)

## 2013-03-28 ENCOUNTER — Other Ambulatory Visit (HOSPITAL_BASED_OUTPATIENT_CLINIC_OR_DEPARTMENT_OTHER): Payer: BC Managed Care – PPO | Admitting: Lab

## 2013-03-28 DIAGNOSIS — E8809 Other disorders of plasma-protein metabolism, not elsewhere classified: Secondary | ICD-10-CM

## 2013-03-28 LAB — COMPREHENSIVE METABOLIC PANEL (CC13)
AST: 25 U/L (ref 5–34)
Albumin: 3.9 g/dL (ref 3.5–5.0)
BUN: 9.1 mg/dL (ref 7.0–26.0)
CO2: 24 mEq/L (ref 22–29)
Calcium: 9 mg/dL (ref 8.4–10.4)
Chloride: 106 mEq/L (ref 98–109)
Glucose: 104 mg/dl (ref 70–140)
Potassium: 4 mEq/L (ref 3.5–5.1)

## 2013-03-28 LAB — CBC WITH DIFFERENTIAL/PLATELET
Basophils Absolute: 0 10*3/uL (ref 0.0–0.1)
EOS%: 11.3 % — ABNORMAL HIGH (ref 0.0–7.0)
Eosinophils Absolute: 0.6 10*3/uL — ABNORMAL HIGH (ref 0.0–0.5)
HGB: 13.2 g/dL (ref 13.0–17.1)
MONO#: 0.6 10*3/uL (ref 0.1–0.9)
NEUT#: 1.8 10*3/uL (ref 1.5–6.5)
RDW: 15.7 % — ABNORMAL HIGH (ref 11.0–14.6)
WBC: 4.9 10*3/uL (ref 4.0–10.3)
lymph#: 1.8 10*3/uL (ref 0.9–3.3)

## 2013-03-29 ENCOUNTER — Encounter: Payer: Self-pay | Admitting: Family Medicine

## 2013-03-29 LAB — KAPPA/LAMBDA LIGHT CHAINS: Kappa free light chain: 1.5 mg/dL (ref 0.33–1.94)

## 2013-03-29 LAB — IGG, IGA, IGM
IgA: 1220 mg/dL — ABNORMAL HIGH (ref 68–379)
IgM, Serum: 31 mg/dL — ABNORMAL LOW (ref 41–251)

## 2013-04-04 ENCOUNTER — Ambulatory Visit: Payer: BC Managed Care – PPO | Admitting: Internal Medicine

## 2013-04-06 ENCOUNTER — Telehealth: Payer: Self-pay | Admitting: Internal Medicine

## 2013-04-06 ENCOUNTER — Ambulatory Visit (HOSPITAL_BASED_OUTPATIENT_CLINIC_OR_DEPARTMENT_OTHER): Payer: BC Managed Care – PPO | Admitting: Internal Medicine

## 2013-04-06 ENCOUNTER — Encounter: Payer: Self-pay | Admitting: Internal Medicine

## 2013-04-06 VITALS — BP 152/85 | HR 64 | Temp 97.6°F | Resp 20 | Ht 75.5 in | Wt 232.6 lb

## 2013-04-06 DIAGNOSIS — E8809 Other disorders of plasma-protein metabolism, not elsewhere classified: Secondary | ICD-10-CM

## 2013-04-06 NOTE — Patient Instructions (Addendum)
No evidence for disease progression on his recent myeloma panel.  Followup visit in 6 months with repeat myeloma

## 2013-04-06 NOTE — Telephone Encounter (Signed)
gave pt appt for lab before Md on January 2015

## 2013-04-06 NOTE — Progress Notes (Signed)
[facility] Telephone:(336) [phone]   Fax:(336) [phone]  OFFICE PROGRESS NOTE  Darren Lopes, MD [place], [place]. [place] [address]  DIAGNOSIS: Plasma cell dyscrasia diagnosed in March of 2013   PRIOR THERAPY: None   CURRENT THERAPY: Observation   INTERVAL HISTORY: Darren Hinton. 67 y.o. male returns to the clinic today for follow up visit accompanied his wife. The patient is feeling fine today with no specific complaints except for mild fatigue. He denied having any significant weight loss or night sweats but has no chest pain, shortness breath, cough or hemoptysis. He denied having any bony aches or pains. The patient has repeat myeloma panel performed recently and he is here for evaluation and discussion of his lab results.   MEDICAL HISTORY: Past Medical History  Diagnosis Date  . Thyroid disease   . Hyperlipidemia   . Hypothyroidism   . Paroxysmal atrial fibrillation   . Mitral valve prolapse   . Hyperplastic colon polyp   . Hypertension   . Hyperproteinemia   . Allergy   . Anxiety   . Heart murmur     ALLERGIES:  has No Known Allergies.  MEDICATIONS:  Current Outpatient Prescriptions  Medication Sig Dispense Refill  . ALPRAZolam (XANAX) 0.25 MG tablet Take 0.25 mg by mouth daily as needed. Anxiety       . aspirin 325 MG tablet Take 325 mg by mouth every morning.       . cromolyn (NASALCROM) 5.2 MG/ACT nasal spray Place 1 spray into the nose 3 (three) times daily as needed. Allergies       . diltiazem (DILACOR XR) 180 MG 24 hr capsule Take 180 mg by mouth every morning.       . fluticasone (FLONASE) 50 MCG/ACT nasal spray Place 2 sprays into the nose daily as needed. Allergies       . ipratropium (ATROVENT) 0.06 % nasal spray Place 2 sprays into the nose 3 (three) times daily as needed. Allergies       . levothyroxine (SYNTHROID, LEVOTHROID) 125 MCG tablet Take 125 mcg by mouth every morning.        . meclizine (ANTIVERT) 25 MG tablet Take 1 tablet (25 mg total) by mouth 3 (three) times daily as needed for dizziness.  30 tablet  0  . metoprolol (LOPRESSOR) 50 MG tablet Take 50 mg by mouth 2 (two) times daily.      . propranolol (INDERAL) 10 MG tablet Take 10 mg by mouth as needed. Prior to dental surgery for anxiety      . simvastatin (ZOCOR) 40 MG tablet Take 40 mg by mouth every morning.        No current facility-administered medications for this visit.    REVIEW OF SYSTEMS:  A comprehensive review of systems was negative.   PHYSICAL EXAMINATION: General appearance: alert, cooperative and no distress Head: Normocephalic, without obvious abnormality, atraumatic Neck: no adenopathy Lymph nodes: Cervical, supraclavicular, and axillary nodes normal. Resp: clear to auscultation bilaterally Cardio: regular rate and rhythm, S1, S2 normal, no murmur, click, rub or gallop GI: soft, non-tender; bowel sounds normal; no masses,  no organomegaly Extremities: extremities normal, atraumatic, no cyanosis or edema  ECOG PERFORMANCE STATUS: 1 - Symptomatic but completely ambulatory  Blood pressure 152/85, pulse 64, temperature 97.6 F (36.4 C), temperature source Oral, resp. rate 20, height 6' 3.5" (1.918 m), weight 232 lb 9.6 oz (105.507 kg).  LABORATORY DATA: Lab Results  Component Value Date   WBC 4.9 03/28/2013   HGB 13.2 03/28/2013   HCT 40.3 03/28/2013   MCV 100.0* 03/28/2013   PLT 260 03/28/2013      Chemistry      Component Value Date/Time   NA 140 03/28/2013 0807   NA 139 03/20/2013 1014   K 4.0 03/28/2013 0807   K 4.2 03/20/2013 1014   CL 106 03/20/2013 1014   CL 105 10/01/2012 0817   CO2 24 03/28/2013 0807   CO2 23 03/20/2013 1014   BUN 9.1 03/28/2013 0807   BUN 10 03/20/2013 1014   CREATININE 1.3 03/28/2013 0807   CREATININE 1.31 03/20/2013 1014   CREATININE 1.42* 04/06/2012 0902      Component Value Date/Time   CALCIUM 9.0 03/28/2013 0807   CALCIUM 9.4 03/20/2013 1014    ALKPHOS 90 03/28/2013 0807   ALKPHOS 88 03/20/2013 1014   AST 25 03/28/2013 0807   AST 23 03/20/2013 1014   ALT 20 03/28/2013 0807   ALT 18 03/20/2013 1014   BILITOT 0.54 03/28/2013 0807   BILITOT 0.5 03/20/2013 1014       RADIOGRAPHIC STUDIES: No results found.  ASSESSMENT AND PLAN: this is a very pleasant 67 years old [name] with history of plasma cell dyscrasia, currently on observation with no evidence for disease progression based on the myeloma panel. I discussed the lab result with the patient. I recommended for him to continue on observation with repeat myeloma panel in 6 months. The patient was advised to call immediately she has any concerning symptoms in the interval.  The patient voices understanding of current disease status and treatment options and is in agreement with the current care plan.  All questions were answered. The patient knows to call the clinic with any problems, questions or concerns. We can certainly see the patient much sooner if necessary.

## 2013-09-30 ENCOUNTER — Telehealth: Payer: Self-pay | Admitting: Medical Oncology

## 2013-09-30 ENCOUNTER — Other Ambulatory Visit: Payer: BC Managed Care – PPO

## 2013-09-30 NOTE — Telephone Encounter (Signed)
I left message for pt to call and r/s labs prior to MD visit.

## 2013-10-03 ENCOUNTER — Telehealth: Payer: Self-pay | Admitting: Internal Medicine

## 2013-10-03 NOTE — Telephone Encounter (Signed)
pt needed to cx due to havin labs at another facility could not pay....will call back to r/s lab and MD visit

## 2013-10-10 ENCOUNTER — Ambulatory Visit: Payer: BC Managed Care – PPO | Admitting: Internal Medicine

## 2013-10-10 ENCOUNTER — Telehealth: Payer: Self-pay | Admitting: Internal Medicine

## 2013-10-10 NOTE — Telephone Encounter (Signed)
pt called to r/s missed appt...done...pt ok and aware °

## 2013-10-19 ENCOUNTER — Other Ambulatory Visit (HOSPITAL_BASED_OUTPATIENT_CLINIC_OR_DEPARTMENT_OTHER): Payer: BC Managed Care – PPO

## 2013-10-19 DIAGNOSIS — E8809 Other disorders of plasma-protein metabolism, not elsewhere classified: Secondary | ICD-10-CM

## 2013-10-19 LAB — CBC WITH DIFFERENTIAL/PLATELET
BASO%: 0.7 % (ref 0.0–2.0)
BASOS ABS: 0 10*3/uL (ref 0.0–0.1)
EOS ABS: 0.4 10*3/uL (ref 0.0–0.5)
EOS%: 9.2 % — ABNORMAL HIGH (ref 0.0–7.0)
HCT: 37.7 % — ABNORMAL LOW (ref 38.4–49.9)
HEMOGLOBIN: 12.4 g/dL — AB (ref 13.0–17.1)
LYMPH%: 36.2 % (ref 14.0–49.0)
MCH: 34.1 pg — ABNORMAL HIGH (ref 27.2–33.4)
MCHC: 32.9 g/dL (ref 32.0–36.0)
MCV: 103.6 fL — AB (ref 79.3–98.0)
MONO#: 0.6 10*3/uL (ref 0.1–0.9)
MONO%: 14 % (ref 0.0–14.0)
NEUT%: 39.9 % (ref 39.0–75.0)
NEUTROS ABS: 1.8 10*3/uL (ref 1.5–6.5)
PLATELETS: 235 10*3/uL (ref 140–400)
RBC: 3.64 10*6/uL — AB (ref 4.20–5.82)
RDW: 16.3 % — ABNORMAL HIGH (ref 11.0–14.6)
WBC: 4.6 10*3/uL (ref 4.0–10.3)
lymph#: 1.7 10*3/uL (ref 0.9–3.3)

## 2013-10-19 LAB — COMPREHENSIVE METABOLIC PANEL (CC13)
ALBUMIN: 4.1 g/dL (ref 3.5–5.0)
ALT: 16 U/L (ref 0–55)
ANION GAP: 10 meq/L (ref 3–11)
AST: 25 U/L (ref 5–34)
Alkaline Phosphatase: 93 U/L (ref 40–150)
BILIRUBIN TOTAL: 0.44 mg/dL (ref 0.20–1.20)
BUN: 12 mg/dL (ref 7.0–26.0)
CO2: 21 meq/L — AB (ref 22–29)
Calcium: 9.1 mg/dL (ref 8.4–10.4)
Chloride: 108 mEq/L (ref 98–109)
Creatinine: 1.3 mg/dL (ref 0.7–1.3)
GLUCOSE: 125 mg/dL (ref 70–140)
POTASSIUM: 3.9 meq/L (ref 3.5–5.1)
SODIUM: 139 meq/L (ref 136–145)
TOTAL PROTEIN: 7.8 g/dL (ref 6.4–8.3)

## 2013-10-19 LAB — LACTATE DEHYDROGENASE (CC13): LDH: 180 U/L (ref 125–245)

## 2013-10-20 LAB — KAPPA/LAMBDA LIGHT CHAINS
KAPPA FREE LGHT CHN: 1.22 mg/dL (ref 0.33–1.94)
Kappa:Lambda Ratio: 0.42 (ref 0.26–1.65)
Lambda Free Lght Chn: 2.91 mg/dL — ABNORMAL HIGH (ref 0.57–2.63)

## 2013-10-20 LAB — IGG, IGA, IGM
IgA: 1210 mg/dL — ABNORMAL HIGH (ref 68–379)
IgG (Immunoglobin G), Serum: 886 mg/dL (ref 650–1600)
IgM, Serum: 32 mg/dL — ABNORMAL LOW (ref 41–251)

## 2013-10-20 LAB — BETA 2 MICROGLOBULIN, SERUM: BETA 2 MICROGLOBULIN: 1.69 mg/L (ref 1.01–1.73)

## 2013-10-26 ENCOUNTER — Other Ambulatory Visit: Payer: BC Managed Care – PPO

## 2013-10-26 ENCOUNTER — Telehealth: Payer: Self-pay | Admitting: Internal Medicine

## 2013-10-26 ENCOUNTER — Encounter (INDEPENDENT_AMBULATORY_CARE_PROVIDER_SITE_OTHER): Payer: Self-pay

## 2013-10-26 ENCOUNTER — Encounter: Payer: Self-pay | Admitting: Internal Medicine

## 2013-10-26 ENCOUNTER — Ambulatory Visit (HOSPITAL_BASED_OUTPATIENT_CLINIC_OR_DEPARTMENT_OTHER): Payer: BC Managed Care – PPO | Admitting: Internal Medicine

## 2013-10-26 VITALS — BP 157/83 | HR 62 | Temp 98.2°F | Resp 19 | Ht 75.5 in | Wt 231.2 lb

## 2013-10-26 DIAGNOSIS — E8809 Other disorders of plasma-protein metabolism, not elsewhere classified: Secondary | ICD-10-CM

## 2013-10-26 NOTE — Telephone Encounter (Signed)
gv pt appt schedule for august

## 2013-10-26 NOTE — Progress Notes (Signed)
[facility] Telephone:(336) [phone]   Fax:(336) [phone]  OFFICE PROGRESS NOTE  Darren Lopes, MD [place] [address]  DIAGNOSIS: Plasma cell dyscrasia diagnosed in March of 2013   PRIOR THERAPY: None   CURRENT THERAPY: Observation   INTERVAL HISTORY: Darren Hinton. 68 y.o. male returns to the clinic today for follow up visit accompanied his wife. The patient is feeling fine today with no specific complaints. He denied having any significant weight loss or night sweats but has no chest pain, shortness of breath, cough or hemoptysis. He denied having any bony aches or pains. The patient has repeat myeloma panel performed recently and he is here for evaluation and discussion of his lab results.   MEDICAL HISTORY: Past Medical History  Diagnosis Date  . Thyroid disease   . Hyperlipidemia   . Hypothyroidism   . Paroxysmal atrial fibrillation   . Mitral valve prolapse   . Hyperplastic colon polyp   . Hypertension   . Hyperproteinemia   . Allergy   . Anxiety   . Heart murmur     ALLERGIES:  has No Known Allergies.  MEDICATIONS:  Current Outpatient Prescriptions  Medication Sig Dispense Refill  . ALPRAZolam (XANAX) 0.25 MG tablet Take 0.25 mg by mouth daily as needed. Anxiety       . aspirin 325 MG tablet Take 325 mg by mouth every morning.       . cromolyn (NASALCROM) 5.2 MG/ACT nasal spray Place 1 spray into the nose 3 (three) times daily as needed. Allergies       . diltiazem (DILACOR XR) 180 MG 24 hr capsule Take 180 mg by mouth every morning.       . fluticasone (FLONASE) 50 MCG/ACT nasal spray Place 2 sprays into the nose daily as needed. Allergies       . ipratropium (ATROVENT) 0.06 % nasal spray Place 2 sprays into the nose 3 (three) times daily as needed. Allergies       . levothyroxine (SYNTHROID, LEVOTHROID) 125 MCG tablet Take 125 mcg by mouth every morning.       . meclizine (ANTIVERT) 25 MG tablet Take 1 tablet  (25 mg total) by mouth 3 (three) times daily as needed for dizziness.  30 tablet  0  . metoprolol (LOPRESSOR) 50 MG tablet Take 50 mg by mouth 2 (two) times daily.      . propranolol (INDERAL) 10 MG tablet Take 10 mg by mouth as needed. Prior to dental surgery for anxiety      . simvastatin (ZOCOR) 40 MG tablet Take 40 mg by mouth every morning.        No current facility-administered medications for this visit.    REVIEW OF SYSTEMS:  A comprehensive review of systems was negative.   PHYSICAL EXAMINATION: General appearance: alert, cooperative and no distress Head: Normocephalic, without obvious abnormality, atraumatic Neck: no adenopathy Lymph nodes: Cervical, supraclavicular, and axillary nodes normal. Resp: clear to auscultation bilaterally Cardio: regular rate and rhythm, S1, S2 normal, no murmur, click, rub or gallop GI: soft, non-tender; bowel sounds normal; no masses,  no organomegaly Extremities: extremities normal, atraumatic, no cyanosis or edema  ECOG PERFORMANCE STATUS: 1 - Symptomatic but completely ambulatory  Blood pressure 157/83, pulse 62, temperature 98.2 F (36.8 C), temperature source Oral, resp. rate 19, height 6' 3.5" (1.918 m), weight 231 lb 3.2 oz (104.872 kg), SpO2 100.00%.  LABORATORY DATA: Lab Results  Component Value Date  WBC 4.6 10/19/2013   HGB 12.4* 10/19/2013   HCT 37.7* 10/19/2013   MCV 103.6* 10/19/2013   PLT 235 10/19/2013      Chemistry      Component Value Date/Time   NA 139 10/19/2013 0843   NA 139 03/20/2013 1014   K 3.9 10/19/2013 0843   K 4.2 03/20/2013 1014   CL 106 03/20/2013 1014   CL 105 10/01/2012 0817   CO2 21* 10/19/2013 0843   CO2 23 03/20/2013 1014   BUN 12.0 10/19/2013 0843   BUN 10 03/20/2013 1014   CREATININE 1.3 10/19/2013 0843   CREATININE 1.31 03/20/2013 1014   CREATININE 1.42* 04/06/2012 0902      Component Value Date/Time   CALCIUM 9.1 10/19/2013 0843   CALCIUM 9.4 03/20/2013 1014   ALKPHOS 93 10/19/2013 0843   ALKPHOS 88  03/20/2013 1014   AST 25 10/19/2013 0843   AST 23 03/20/2013 1014   ALT 16 10/19/2013 0843   ALT 18 03/20/2013 1014   BILITOT 0.44 10/19/2013 0843   BILITOT 0.5 03/20/2013 1014     Other lab results: Beta-2 microglobulin 1.69, free kappa light chain 1.22, free lambda light chain 2.91 with a kappa/lambda ratio of 0.42. IgG 886, IgA 1210 and IgM 32.   RADIOGRAPHIC STUDIES: No results found.  ASSESSMENT AND PLAN: this is a very pleasant 68 years old [name] with history of plasma cell dyscrasia, currently on observation with no evidence for disease progression based on the myeloma panel. I discussed the lab result with the patient. I recommended for him to continue on observation with repeat myeloma panel in 6 months. The patient was advised to call immediately she has any concerning symptoms in the interval.  The patient voices understanding of current disease status and treatment options and is in agreement with the current care plan.  All questions were answered. The patient knows to call the clinic with any problems, questions or concerns. We can certainly see the patient much sooner if necessary.  Disclaimer: This note was dictated with voice recognition software. Similar sounding words can inadvertently be transcribed and may not be corrected upon review.

## 2014-03-12 ENCOUNTER — Encounter (HOSPITAL_COMMUNITY): Payer: Self-pay | Admitting: Emergency Medicine

## 2014-03-12 ENCOUNTER — Emergency Department (HOSPITAL_COMMUNITY)
Admission: EM | Admit: 2014-03-12 | Discharge: 2014-03-12 | Disposition: A | Discharge status: Home / Self Care | Payer: BC Managed Care – PPO | Attending: Emergency Medicine | Admitting: Emergency Medicine

## 2014-03-12 ENCOUNTER — Emergency Department (HOSPITAL_COMMUNITY): Payer: BC Managed Care – PPO

## 2014-03-12 DIAGNOSIS — I10 Essential (primary) hypertension: Secondary | ICD-10-CM | POA: Insufficient documentation

## 2014-03-12 DIAGNOSIS — I4891 Unspecified atrial fibrillation: Secondary | ICD-10-CM | POA: Insufficient documentation

## 2014-03-12 DIAGNOSIS — I059 Rheumatic mitral valve disease, unspecified: Secondary | ICD-10-CM | POA: Insufficient documentation

## 2014-03-12 DIAGNOSIS — F411 Generalized anxiety disorder: Secondary | ICD-10-CM | POA: Insufficient documentation

## 2014-03-12 DIAGNOSIS — R011 Cardiac murmur, unspecified: Secondary | ICD-10-CM | POA: Insufficient documentation

## 2014-03-12 DIAGNOSIS — E039 Hypothyroidism, unspecified: Secondary | ICD-10-CM | POA: Insufficient documentation

## 2014-03-12 DIAGNOSIS — Z87891 Personal history of nicotine dependence: Secondary | ICD-10-CM | POA: Insufficient documentation

## 2014-03-12 DIAGNOSIS — Z7982 Long term (current) use of aspirin: Secondary | ICD-10-CM | POA: Insufficient documentation

## 2014-03-12 DIAGNOSIS — Z860100 Personal history of colon polyps, unspecified: Secondary | ICD-10-CM | POA: Insufficient documentation

## 2014-03-12 DIAGNOSIS — Z79899 Other long term (current) drug therapy: Secondary | ICD-10-CM | POA: Insufficient documentation

## 2014-03-12 LAB — CBC
HCT: 36.5 % — ABNORMAL LOW (ref 39.0–52.0)
Hemoglobin: 12.2 g/dL — ABNORMAL LOW (ref 13.0–17.0)
MCH: 37.8 pg — ABNORMAL HIGH (ref 26.0–34.0)
MCHC: 33.4 g/dL (ref 30.0–36.0)
MCV: 113 fL — AB (ref 78.0–100.0)
PLATELETS: 250 10*3/uL (ref 150–400)
RBC: 3.23 MIL/uL — AB (ref 4.22–5.81)
RDW: 16.4 % — ABNORMAL HIGH (ref 11.5–15.5)
WBC: 5 10*3/uL (ref 4.0–10.5)

## 2014-03-12 LAB — BASIC METABOLIC PANEL
ANION GAP: 16 — AB (ref 5–15)
BUN: 9 mg/dL (ref 6–23)
CHLORIDE: 102 meq/L (ref 96–112)
CO2: 22 meq/L (ref 19–32)
CREATININE: 1.23 mg/dL (ref 0.50–1.35)
Calcium: 9 mg/dL (ref 8.4–10.5)
GFR calc Af Amer: 68 mL/min — ABNORMAL LOW (ref >90)
GFR calc non Af Amer: 59 mL/min — ABNORMAL LOW (ref >90)
Glucose, Bld: 123 mg/dL — ABNORMAL HIGH (ref 70–99)
POTASSIUM: 4 meq/L (ref 3.7–5.3)
Sodium: 140 mEq/L (ref 137–147)

## 2014-03-12 LAB — I-STAT TROPONIN, ED: TROPONIN I, POC: 0 ng/mL (ref 0.00–0.08)

## 2014-03-12 MED ORDER — DILTIAZEM HCL 100 MG IV SOLR
5.0000 mg/h | INTRAVENOUS | Status: DC
  Administered 2014-03-12: 5 mg/h via INTRAVENOUS

## 2014-03-12 MED ORDER — DILTIAZEM HCL 25 MG/5ML IV SOLN
10.0000 mg | Freq: Once | INTRAVENOUS | Status: AC
  Administered 2014-03-12: 10 mg via INTRAVENOUS
  Filled 2014-03-12: qty 5

## 2014-03-12 MED ORDER — DILTIAZEM HCL ER COATED BEADS 240 MG PO CP24: 240.0000 mg | ORAL_CAPSULE | Freq: Every day | ORAL | Status: DC

## 2014-03-12 NOTE — ED Notes (Signed)
Patient returned from X-ray 

## 2014-03-12 NOTE — ED Notes (Signed)
Patient transported to X-ray 

## 2014-03-12 NOTE — Discharge Instructions (Signed)
Increase the Lopressor or metoprolol to 75mg  twice a day.  That's is one and a half pills twice a day.   Stop the diltiazem,  Dilacor Xr 180 mg and start the diltiazem 240 mg to be take once a day.  Call the office tomorrow and let them know how you are doing.  [phone]

## 2014-03-12 NOTE — ED Notes (Signed)
Pt reports that he was eating dinner and felt like his heart was racing. Denies any pain, nausea, or SOB. Denies any hx of SVT or MI. No pain at triage

## 2014-03-12 NOTE — ED Provider Notes (Signed)
CSN: [id number]     Arrival date & time 03/12/14  1614 History   First MD Initiated Contact with Patient 03/12/14 1647     Chief Complaint  Patient presents with  . Palpitations     (Consider location/radiation/quality/duration/timing/severity/associated sxs/prior Treatment) Patient is a 68 y.o. male presenting with palpitations. The history is provided by the patient (the pt complains of palpitaions).  Palpitations Palpitations quality:  Irregular Onset quality:  Sudden Timing:  Constant Progression:  Unchanged Chronicity:  Recurrent Context: not anxiety   Associated symptoms: no back pain, no chest pain and no cough     Past Medical History  Diagnosis Date  . Thyroid disease   . Hyperlipidemia   . Hypothyroidism   . Paroxysmal atrial fibrillation   . Mitral valve prolapse   . Hyperplastic colon polyp   . Hypertension   . Hyperproteinemia   . Allergy   . Anxiety   . Heart murmur    History reviewed. No pertinent past surgical history. Family History  Problem Relation Age of Onset  . Cancer Father    History  Substance Use Topics  . Smoking status: Former Smoker -- 20 years    Types: Cigarettes    Quit date: 10/01/1978  . Smokeless tobacco: Not on file  . Alcohol Use: 1.2 oz/week    2 Glasses of wine per week    Review of Systems  Constitutional: Negative for appetite change and fatigue.  HENT: Negative for congestion, ear discharge and sinus pressure.   Eyes: Negative for discharge.  Respiratory: Negative for cough.   Cardiovascular: Positive for palpitations. Negative for chest pain.  Gastrointestinal: Negative for abdominal pain and diarrhea.  Genitourinary: Negative for frequency and hematuria.  Musculoskeletal: Negative for back pain.  Skin: Negative for rash.  Neurological: Negative for seizures and headaches.  Psychiatric/Behavioral: Negative for hallucinations.      Allergies  Review of patient's allergies indicates no known allergies.  Home  Medications   Prior to Admission medications   Medication Sig Start Date End Date Taking? Authorizing Provider  ALPRAZolam (XANAX) 0.25 MG tablet Take 0.25 mg by mouth daily as needed. Anxiety     Leanna Battles, MD  aspirin 325 MG tablet Take 325 mg by mouth every morning.     Historical Provider, MD  cromolyn (NASALCROM) 5.2 MG/ACT nasal spray Place 1 spray into the nose 3 (three) times daily as needed. Allergies     Historical Provider, MD  diltiazem (CARTIA XT) 240 MG 24 hr capsule Take 1 capsule (240 mg total) by mouth daily. 03/12/14   Maudry Diego, MD  diltiazem (DILACOR XR) 180 MG 24 hr capsule Take 180 mg by mouth every morning.     Historical Provider, MD  fluticasone (FLONASE) 50 MCG/ACT nasal spray Place 2 sprays into the nose daily as needed. Allergies     Historical Provider, MD  ipratropium (ATROVENT) 0.06 % nasal spray Place 2 sprays into the nose 3 (three) times daily as needed. Allergies     Historical Provider, MD  levothyroxine (SYNTHROID, LEVOTHROID) 125 MCG tablet Take 125 mcg by mouth every morning.     Historical Provider, MD  meclizine (ANTIVERT) 25 MG tablet Take 1 tablet (25 mg total) by mouth 3 (three) times daily as needed for dizziness. 03/20/13   Shawnee Knapp, MD  metoprolol (LOPRESSOR) 50 MG tablet Take 50 mg by mouth 2 (two) times daily.    Historical Provider, MD  propranolol (INDERAL) 10 MG tablet Take 10 mg  by mouth as needed. Prior to dental surgery for anxiety    Historical Provider, MD  simvastatin (ZOCOR) 40 MG tablet Take 40 mg by mouth every morning.     Historical Provider, MD   BP 138/87  Pulse 108  Temp(Src) 98.2 F (36.8 C) (Oral)  Resp 21  Ht 6\' 5"  (1.956 m)  Wt 232 lb (105.235 kg)  BMI 27.51 kg/m2  SpO2 99% Physical Exam  Constitutional: He is oriented to person, place, and time. He appears well-developed.  HENT:  Head: Normocephalic.  Eyes: Conjunctivae and EOM are normal. No scleral icterus.  Neck: Neck supple. No thyromegaly  present.  Cardiovascular: Exam reveals no gallop and no friction rub.   No murmur heard. Rapid afib  Pulmonary/Chest: No stridor. He has no wheezes. He has no rales. He exhibits no tenderness.  Abdominal: He exhibits no distension. There is no tenderness. There is no rebound.  Musculoskeletal: Normal range of motion. He exhibits no edema.  Lymphadenopathy:    He has no cervical adenopathy.  Neurological: He is oriented to person, place, and time. He exhibits normal muscle tone. Coordination normal.  Skin: No rash noted. No erythema.  Psychiatric: He has a normal mood and affect. His behavior is normal.    ED Course  Procedures (including critical care time) Labs Review Labs Reviewed  CBC - Abnormal; Notable for the following:    RBC 3.23 (*)    Hemoglobin 12.2 (*)    HCT 36.5 (*)    MCV 113.0 (*)    MCH 37.8 (*)    RDW 16.4 (*)    All other components within normal limits  BASIC METABOLIC PANEL - Abnormal; Notable for the following:    Glucose, Bld 123 (*)    GFR calc non Af Amer 59 (*)    GFR calc Af Amer 68 (*)    Anion gap 16 (*)    All other components within normal limits  I-STAT TROPOININ, ED    Imaging Review Dg Chest 2 View  03/12/2014   CLINICAL DATA:  Palpitations.  EXAM: CHEST  2 VIEW  COMPARISON:  08/30/2008.  FINDINGS: Normal sized heart. Small amount of linear density at the right lung base. Otherwise, mildly hyperexpanded lungs with mild central peribronchial thickening. Mild thoracic spine degenerative changes.  IMPRESSION: 1. Mild changes of COPD and chronic bronchitis. 2. Mild linear atelectasis or scarring at the right lung base.   Electronically Signed   By: Enrique Sack M.D.   On: 03/12/2014 17:16     EKG Interpretation   Date/Time:  Sunday March 12 2014 16:19:18 EDT Ventricular Rate:  156 PR Interval:    QRS Duration: 76 QT Interval:  286 QTC Calculation: 460 R Axis:   -30 Text Interpretation:  Atrial fibrillation with rapid ventricular response   Left axis deviation Marked ST abnormality, possible inferior  subendocardial injury Abnormal ECG Confirmed by Jaquarious Grey  MD, Broadus John [phone])  on 03/12/2014 6:36:26 PM      MDM   Final diagnoses:  Atrial fibrillation, unspecified    I spoke with cardiology and the rec was to increase lopressor to 75mg  bid and increase diltiazem to 240mg  q day.  Pt will call office in am    Maudry Diego, MD 03/12/14 [phone]

## 2014-03-12 NOTE — ED Notes (Signed)
D/c teaching being done.  Per pt and wife pt is not on Diltiazem Dilacor 180 mg.  Pt stopped taking this d/t med giving him headaches.  D/c instructions were to stop this med and take Diltiazem 240 mg.  MD made aware, MD discarded this prescription and told pt to f/u with MD in am.  Pt is also to take Lopressor 100 mg tonight and resume instructions of Lopressor 75 mg BID.  Pt and wife verbalized understanding.

## 2014-03-12 NOTE — ED Notes (Signed)
Remains in AFib; out of RVR; rate 85-100

## 2014-04-05 ENCOUNTER — Encounter: Payer: Self-pay | Admitting: Internal Medicine

## 2014-04-19 ENCOUNTER — Other Ambulatory Visit (HOSPITAL_BASED_OUTPATIENT_CLINIC_OR_DEPARTMENT_OTHER): Payer: BC Managed Care – PPO

## 2014-04-19 DIAGNOSIS — E8809 Other disorders of plasma-protein metabolism, not elsewhere classified: Secondary | ICD-10-CM

## 2014-04-19 LAB — CBC WITH DIFFERENTIAL/PLATELET
BASO%: 0.8 % (ref 0.0–2.0)
Basophils Absolute: 0 10*3/uL (ref 0.0–0.1)
EOS%: 10.8 % — ABNORMAL HIGH (ref 0.0–7.0)
Eosinophils Absolute: 0.6 10*3/uL — ABNORMAL HIGH (ref 0.0–0.5)
HCT: 35.1 % — ABNORMAL LOW (ref 38.4–49.9)
HGB: 11.4 g/dL — ABNORMAL LOW (ref 13.0–17.1)
LYMPH%: 34.1 % (ref 14.0–49.0)
MCH: 37.7 pg — ABNORMAL HIGH (ref 27.2–33.4)
MCHC: 32.6 g/dL (ref 32.0–36.0)
MCV: 115.5 fL — ABNORMAL HIGH (ref 79.3–98.0)
MONO#: 0.7 10*3/uL (ref 0.1–0.9)
MONO%: 13.7 % (ref 0.0–14.0)
NEUT#: 2.1 10*3/uL (ref 1.5–6.5)
NEUT%: 40.6 % (ref 39.0–75.0)
Platelets: 259 10*3/uL (ref 140–400)
RBC: 3.04 10*6/uL — AB (ref 4.20–5.82)
RDW: 17.2 % — AB (ref 11.0–14.6)
WBC: 5.2 10*3/uL (ref 4.0–10.3)
lymph#: 1.8 10*3/uL (ref 0.9–3.3)

## 2014-04-19 LAB — COMPREHENSIVE METABOLIC PANEL (CC13)
ALBUMIN: 4 g/dL (ref 3.5–5.0)
ALT: 15 U/L (ref 0–55)
ANION GAP: 12 meq/L — AB (ref 3–11)
AST: 28 U/L (ref 5–34)
Alkaline Phosphatase: 79 U/L (ref 40–150)
BUN: 9.9 mg/dL (ref 7.0–26.0)
CO2: 22 meq/L (ref 22–29)
Calcium: 9 mg/dL (ref 8.4–10.4)
Chloride: 106 mEq/L (ref 98–109)
Creatinine: 1.4 mg/dL — ABNORMAL HIGH (ref 0.7–1.3)
GLUCOSE: 98 mg/dL (ref 70–140)
Potassium: 4.1 mEq/L (ref 3.5–5.1)
SODIUM: 141 meq/L (ref 136–145)
TOTAL PROTEIN: 7.9 g/dL (ref 6.4–8.3)
Total Bilirubin: 0.52 mg/dL (ref 0.20–1.20)

## 2014-04-19 LAB — LACTATE DEHYDROGENASE (CC13): LDH: 201 U/L (ref 125–245)

## 2014-04-21 LAB — IGG, IGA, IGM
IGA: 1330 mg/dL — AB (ref 68–379)
IgG (Immunoglobin G), Serum: 974 mg/dL (ref 650–1600)
IgM, Serum: 27 mg/dL — ABNORMAL LOW (ref 41–251)

## 2014-04-21 LAB — KAPPA/LAMBDA LIGHT CHAINS
Kappa free light chain: 1.7 mg/dL (ref 0.33–1.94)
Kappa:Lambda Ratio: 0.49 (ref 0.26–1.65)
Lambda Free Lght Chn: 3.44 mg/dL — ABNORMAL HIGH (ref 0.57–2.63)

## 2014-04-21 LAB — BETA 2 MICROGLOBULIN, SERUM: Beta-2 Microglobulin: 2.79 mg/L — ABNORMAL HIGH (ref <=2.51)

## 2014-04-26 ENCOUNTER — Telehealth: Payer: Self-pay | Admitting: Internal Medicine

## 2014-04-26 ENCOUNTER — Ambulatory Visit: Payer: BC Managed Care – PPO | Admitting: Internal Medicine

## 2014-04-26 NOTE — Telephone Encounter (Signed)
Pt r/s from 08/19 to 08/20 due to time conflict...Marland KitchenMarland KitchenKJ

## 2014-04-27 ENCOUNTER — Encounter: Payer: Self-pay | Admitting: Internal Medicine

## 2014-04-27 ENCOUNTER — Telehealth: Payer: Self-pay | Admitting: Internal Medicine

## 2014-04-27 ENCOUNTER — Ambulatory Visit (HOSPITAL_BASED_OUTPATIENT_CLINIC_OR_DEPARTMENT_OTHER): Payer: BC Managed Care – PPO | Admitting: Internal Medicine

## 2014-04-27 VITALS — BP 163/78 | HR 61 | Temp 98.2°F | Resp 18 | Ht 77.0 in | Wt 234.0 lb

## 2014-04-27 DIAGNOSIS — D649 Anemia, unspecified: Secondary | ICD-10-CM

## 2014-04-27 DIAGNOSIS — N289 Disorder of kidney and ureter, unspecified: Secondary | ICD-10-CM

## 2014-04-27 DIAGNOSIS — E8809 Other disorders of plasma-protein metabolism, not elsewhere classified: Secondary | ICD-10-CM

## 2014-04-27 NOTE — Progress Notes (Signed)
[facility] [phone]   Fax:([phone]  OFFICE PROGRESS NOTE  [name], MD [place] [address]  DIAGNOSIS: Plasma cell dyscrasia diagnosed in March of 2013   PRIOR THERAPY: None   CURRENT THERAPY: Observation  INTERVAL HISTORY: Darren Hinton. 69 y.o. male returns to the clinic today for follow up visit accompanied his wife. The patient is feeling fine today with no specific complaints. He has been observation for the last 2 1/2  years with no significant issues. He denied having any significant weight loss or night sweats but has no chest pain, shortness of breath, cough or hemoptysis. He denied having any bony aches or pains. The patient has repeat myeloma panel performed recently and he is here for evaluation and discussion of his lab results.   MEDICAL HISTORY: Past Medical History  Diagnosis Date  . Thyroid disease   . Hyperlipidemia   . Hypothyroidism   . Paroxysmal atrial fibrillation   . Mitral valve prolapse   . Hyperplastic colon polyp   . Hypertension   . Hyperproteinemia   . Allergy   . Anxiety   . Heart murmur     ALLERGIES:  has No Known Allergies.  MEDICATIONS:  Current Outpatient Prescriptions  Medication Sig Dispense Refill  . ALPRAZolam (XANAX) 0.25 MG tablet Take 0.25 mg by mouth daily as needed. Anxiety       . aspirin 325 MG tablet Take 325 mg by mouth every morning.       . diltiazem (DILACOR XR) 180 MG 24 hr capsule Take 180 mg by mouth every morning.       Marland Kitchen ipratropium (ATROVENT) 0.06 % nasal spray Place 2 sprays into the nose 3 (three) times daily as needed. Allergies       . levothyroxine (SYNTHROID, LEVOTHROID) 125 MCG tablet Take 125 mcg by mouth every morning. One daily  except Sunday      . meclizine (ANTIVERT) 25 MG tablet Take 1 tablet (25 mg total) by mouth 3 (three) times daily as needed for dizziness.  30 tablet  0  . simvastatin (ZOCOR) 40 MG tablet Take 40 mg by  mouth every morning.       . TOPROL XL 100 MG 24 hr tablet Take 100 mg by mouth daily.      . cromolyn (NASALCROM) 5.2 MG/ACT nasal spray Place 1 spray into the nose 3 (three) times daily as needed. Allergies       . fluticasone (FLONASE) 50 MCG/ACT nasal spray Place 2 sprays into the nose daily as needed. Allergies       . propranolol (INDERAL) 10 MG tablet Take 10 mg by mouth as needed. Prior to dental surgery for anxiety       No current facility-administered medications for this visit.    REVIEW OF SYSTEMS:  A comprehensive review of systems was negative.   PHYSICAL EXAMINATION: General appearance: alert, cooperative and no distress Head: Normocephalic, without obvious abnormality, atraumatic Neck: no adenopathy Lymph nodes: Cervical, supraclavicular, and axillary nodes normal. Resp: clear to auscultation bilaterally Cardio: regular rate and rhythm, S1, S2 normal, no murmur, click, rub or gallop GI: soft, non-tender; bowel sounds normal; no masses,  no organomegaly Extremities: extremities normal, atraumatic, no cyanosis or edema  ECOG PERFORMANCE STATUS: 1 - Symptomatic but completely ambulatory  Blood pressure 163/78, pulse 61, temperature 98.2 F (36.8 C), temperature source Oral, resp. rate 18, height 6\' 5"  (1.956 m), weight 234  lb (106.142 kg).  LABORATORY DATA: Lab Results  Component Value Date   WBC 5.2 04/19/2014   HGB 11.4* 04/19/2014   HCT 35.1* 04/19/2014   MCV 115.5* 04/19/2014   PLT 259 04/19/2014      Chemistry      Component Value Date/Time   NA 141 04/19/2014 0756   NA 140 03/12/2014 1643   K 4.1 04/19/2014 0756   K 4.0 03/12/2014 1643   CL 102 03/12/2014 1643   CL 105 10/01/2012 0817   CO2 22 04/19/2014 0756   CO2 22 03/12/2014 1643   BUN 9.9 04/19/2014 0756   BUN 9 03/12/2014 1643   CREATININE 1.4* 04/19/2014 0756   CREATININE 1.23 03/12/2014 1643   CREATININE 1.31 03/20/2013 1014      Component Value Date/Time   CALCIUM 9.0 04/19/2014 0756   CALCIUM 9.0 03/12/2014  1643   ALKPHOS 79 04/19/2014 0756   ALKPHOS 88 03/20/2013 1014   AST 28 04/19/2014 0756   AST 23 03/20/2013 1014   ALT 15 04/19/2014 0756   ALT 18 03/20/2013 1014   BILITOT 0.52 04/19/2014 0756   BILITOT 0.5 03/20/2013 1014     Other lab results: Beta-2 microglobulin 2.79, free kappa light chain 1.70, free lambda light chain 3.44 with a kappa/lambda ratio of 0.49. IgG 974, IgA 1330 and IgM 27.   RADIOGRAPHIC STUDIES: No results found.  ASSESSMENT AND PLAN: this is a very pleasant 68 years old [name] with history of plasma cell dyscrasia, currently on observation. His myeloma panel showed mild increase and the free lambda light chain as well as the IgA.  The patient is still asymptomatic but has mild anemia as well as mild renal dysfunction. I discussed the lab result with the patient. I recommended for him to continue on observation with repeat myeloma panel in 6 months. The patient was advised to call immediately she has any concerning symptoms in the interval.  The patient voices understanding of current disease status and treatment options and is in agreement with the current care plan.  All questions were answered. The patient knows to call the clinic with any problems, questions or concerns. We can certainly see the patient much sooner if necessary.  Disclaimer: This note was dictated with voice recognition software. Similar sounding words can inadvertently be transcribed and may not be corrected upon review.

## 2014-04-27 NOTE — Telephone Encounter (Signed)
gv adn printed appt sched and avs for opt for Feb 2016

## 2014-10-05 ENCOUNTER — Encounter: Payer: Self-pay | Admitting: Gastroenterology

## 2014-10-10 ENCOUNTER — Encounter: Payer: Self-pay | Admitting: Internal Medicine

## 2014-10-19 ENCOUNTER — Other Ambulatory Visit (HOSPITAL_BASED_OUTPATIENT_CLINIC_OR_DEPARTMENT_OTHER): Payer: BLUE CROSS/BLUE SHIELD

## 2014-10-19 DIAGNOSIS — E8809 Other disorders of plasma-protein metabolism, not elsewhere classified: Secondary | ICD-10-CM

## 2014-10-19 LAB — COMPREHENSIVE METABOLIC PANEL (CC13)
ALBUMIN: 4.1 g/dL (ref 3.5–5.0)
ALT: 9 U/L (ref 0–55)
AST: 20 U/L (ref 5–34)
Alkaline Phosphatase: 84 U/L (ref 40–150)
Anion Gap: 10 mEq/L (ref 3–11)
BUN: 8.9 mg/dL (ref 7.0–26.0)
CO2: 25 mEq/L (ref 22–29)
CREATININE: 1.1 mg/dL (ref 0.7–1.3)
Calcium: 8.6 mg/dL (ref 8.4–10.4)
Chloride: 107 mEq/L (ref 98–109)
EGFR: 76 mL/min/{1.73_m2} — AB (ref >90)
GLUCOSE: 91 mg/dL (ref 70–140)
Potassium: 4.2 mEq/L (ref 3.5–5.1)
Sodium: 142 mEq/L (ref 136–145)
TOTAL PROTEIN: 7.6 g/dL (ref 6.4–8.3)
Total Bilirubin: 0.53 mg/dL (ref 0.20–1.20)

## 2014-10-19 LAB — CBC WITH DIFFERENTIAL/PLATELET
BASO%: 0.8 % (ref 0.0–2.0)
Basophils Absolute: 0 10*3/uL (ref 0.0–0.1)
EOS ABS: 0.6 10*3/uL — AB (ref 0.0–0.5)
EOS%: 13.6 % — AB (ref 0.0–7.0)
HEMATOCRIT: 32.4 % — AB (ref 38.4–49.9)
HEMOGLOBIN: 10.5 g/dL — AB (ref 13.0–17.1)
LYMPH#: 1.3 10*3/uL (ref 0.9–3.3)
LYMPH%: 30.3 % (ref 14.0–49.0)
MCH: 40.8 pg — ABNORMAL HIGH (ref 27.2–33.4)
MCHC: 32.4 g/dL (ref 32.0–36.0)
MCV: 126 fL — ABNORMAL HIGH (ref 79.3–98.0)
MONO#: 0.7 10*3/uL (ref 0.1–0.9)
MONO%: 15.9 % — ABNORMAL HIGH (ref 0.0–14.0)
NEUT#: 1.7 10*3/uL (ref 1.5–6.5)
NEUT%: 39.4 % (ref 39.0–75.0)
Platelets: 329 10*3/uL (ref 140–400)
RBC: 2.57 10*6/uL — ABNORMAL LOW (ref 4.20–5.82)
RDW: 20 % — AB (ref 11.0–14.6)
WBC: 4.3 10*3/uL (ref 4.0–10.3)

## 2014-10-19 LAB — LACTATE DEHYDROGENASE (CC13): LDH: 220 U/L (ref 125–245)

## 2014-10-23 LAB — KAPPA/LAMBDA LIGHT CHAINS
KAPPA FREE LGHT CHN: 1.56 mg/dL (ref 0.33–1.94)
KAPPA LAMBDA RATIO: 0.41 (ref 0.26–1.65)
Lambda Free Lght Chn: 3.82 mg/dL — ABNORMAL HIGH (ref 0.57–2.63)

## 2014-10-23 LAB — BETA 2 MICROGLOBULIN, SERUM: BETA 2 MICROGLOBULIN: 2.63 mg/L — AB (ref <=2.51)

## 2014-10-23 LAB — IGG, IGA, IGM
IGG (IMMUNOGLOBIN G), SERUM: 899 mg/dL (ref 650–1600)
IgA: 1390 mg/dL — ABNORMAL HIGH (ref 68–379)
IgM, Serum: 30 mg/dL — ABNORMAL LOW (ref 41–251)

## 2014-10-26 ENCOUNTER — Ambulatory Visit (HOSPITAL_BASED_OUTPATIENT_CLINIC_OR_DEPARTMENT_OTHER): Payer: BLUE CROSS/BLUE SHIELD | Admitting: Internal Medicine

## 2014-10-26 ENCOUNTER — Encounter: Payer: Self-pay | Admitting: Internal Medicine

## 2014-10-26 ENCOUNTER — Other Ambulatory Visit: Payer: Self-pay | Admitting: *Deleted

## 2014-10-26 ENCOUNTER — Telehealth: Payer: Self-pay | Admitting: Internal Medicine

## 2014-10-26 VITALS — BP 144/92 | HR 56 | Temp 98.9°F | Resp 19 | Ht 77.0 in | Wt 226.4 lb

## 2014-10-26 DIAGNOSIS — D539 Nutritional anemia, unspecified: Secondary | ICD-10-CM | POA: Insufficient documentation

## 2014-10-26 DIAGNOSIS — E8809 Other disorders of plasma-protein metabolism, not elsewhere classified: Secondary | ICD-10-CM

## 2014-10-26 NOTE — Telephone Encounter (Signed)
Gave avs & calendar for August °

## 2014-10-26 NOTE — Progress Notes (Signed)
[facility] Telephone:([phone]   Fax:([phone]  OFFICE PROGRESS NOTE  [name], MD [place] [address]  DIAGNOSIS: Plasma cell dyscrasia diagnosed in March of 2013   PRIOR THERAPY: None   CURRENT THERAPY: Observation  INTERVAL HISTORY: [name]. 69 y.o. male returns to the clinic today for 6 months follow up visit accompanied his wife. The patient is feeling fine today with no specific complaints. He has more energy after starting take in oral vitamin B-12 tablets. He has been observation for the last 3 years with no significant issues. He denied having any significant weight loss or night sweats but has no chest pain, shortness of breath, cough or hemoptysis. The patient has repeat myeloma panel performed recently and he is here for evaluation and discussion of his lab results.   MEDICAL HISTORY: Past Medical History  Diagnosis Date  . Thyroid disease   . Hyperlipidemia   . Hypothyroidism   . Paroxysmal atrial fibrillation   . Mitral valve prolapse   . Hyperplastic colon polyp   . Hypertension   . Hyperproteinemia   . Allergy   . Anxiety   . Heart murmur     ALLERGIES:  has No Known Allergies.  MEDICATIONS:  Current Outpatient Prescriptions  Medication Sig Dispense Refill  . ALPRAZolam (XANAX) 0.25 MG tablet Take 0.25 mg by mouth daily as needed. Anxiety     . amLODipine (NORVASC) 5 MG tablet     . aspirin 325 MG tablet Take 325 mg by mouth every morning.     . cromolyn (NASALCROM) 5.2 MG/ACT nasal spray Place 1 spray into the nose 3 (three) times daily as needed. Allergies     . fluticasone (FLONASE) 50 MCG/ACT nasal spray Place 2 sprays into the nose daily as needed. Allergies     . HYDROcodone-homatropine (HYCODAN) 5-1.5 MG/5ML syrup   0  . ipratropium (ATROVENT) 0.06 % nasal spray Place 2 sprays into the nose 3 (three) times daily as needed. Allergies     . levothyroxine (SYNTHROID, LEVOTHROID)  125 MCG tablet Take 125 mcg by mouth every morning. One daily  except Sunday    . meclizine (ANTIVERT) 25 MG tablet Take 1 tablet (25 mg total) by mouth 3 (three) times daily as needed for dizziness. 30 tablet 0  . propranolol (INDERAL) 10 MG tablet Take 10 mg by mouth as needed. Prior to dental surgery for anxiety    . simvastatin (ZOCOR) 40 MG tablet Take 40 mg by mouth every morning.     . TOPROL XL 100 MG 24 hr tablet Take 100 mg by mouth daily.     No current facility-administered medications for this visit.    REVIEW OF SYSTEMS:  A comprehensive review of systems was negative.   PHYSICAL EXAMINATION: General appearance: alert, cooperative and no distress Head: Normocephalic, without obvious abnormality, atraumatic Neck: no adenopathy Lymph nodes: Cervical, supraclavicular, and axillary nodes normal. Resp: clear to auscultation bilaterally Cardio: regular rate and rhythm, S1, S2 normal, no murmur, click, rub or gallop GI: soft, non-tender; bowel sounds normal; no masses,  no organomegaly Extremities: extremities normal, atraumatic, no cyanosis or edema  ECOG PERFORMANCE STATUS: 1 - Symptomatic but completely ambulatory  Blood pressure 144/92, pulse 56, temperature 98.9 F (37.2 C), temperature source Oral, resp. rate 19, height 6\' 5"  (1.956 m), weight 226 lb 6.4 oz (102.694 kg), SpO2 100 %.  LABORATORY DATA: Lab Results  Component Value Date  WBC 4.3 10/19/2014   HGB 10.5* 10/19/2014   HCT 32.4* 10/19/2014   MCV 126.0* 10/19/2014   PLT 329 10/19/2014      Chemistry      Component Value Date/Time   NA 142 10/19/2014 0916   NA 140 03/12/2014 1643   K 4.2 10/19/2014 0916   K 4.0 03/12/2014 1643   CL 102 03/12/2014 1643   CL 105 10/01/2012 0817   CO2 25 10/19/2014 0916   CO2 22 03/12/2014 1643   BUN 8.9 10/19/2014 0916   BUN 9 03/12/2014 1643   CREATININE 1.1 10/19/2014 0916   CREATININE 1.23 03/12/2014 1643   CREATININE 1.31 03/20/2013 1014      Component Value  Date/Time   CALCIUM 8.6 10/19/2014 0916   CALCIUM 9.0 03/12/2014 1643   ALKPHOS 84 10/19/2014 0916   ALKPHOS 88 03/20/2013 1014   AST 20 10/19/2014 0916   AST 23 03/20/2013 1014   ALT 9 10/19/2014 0916   ALT 18 03/20/2013 1014   BILITOT 0.53 10/19/2014 0916   BILITOT 0.5 03/20/2013 1014     Other lab results: Beta-2 microglobulin 2.63, free kappa light chain 1.56, free lambda light chain 3.82 with a kappa/lambda ratio of 0.41. IgG 899, IgA 1390 and IgM 30.   RADIOGRAPHIC STUDIES: No results found.  ASSESSMENT AND PLAN: this is a very pleasant 69 years old [name] with history of plasma cell dyscrasia, currently on observation. His myeloma panel showed stable disease I discussed the lab result with the patient. I recommended for him to continue on observation with repeat myeloma panel in 6 months. For the macrocytic anemia, I will recheck vitamin B 12 level and serum folate with the next blood work. The patient was advised to call immediately she has any concerning symptoms in the interval.  The patient voices understanding of current disease status and treatment options and is in agreement with the current care plan.  All questions were answered. The patient knows to call the clinic with any problems, questions or concerns. We can certainly see the patient much sooner if necessary.  Disclaimer: This note was dictated with voice recognition software. Similar sounding words can inadvertently be transcribed and may not be corrected upon review.

## 2015-04-26 ENCOUNTER — Other Ambulatory Visit (HOSPITAL_BASED_OUTPATIENT_CLINIC_OR_DEPARTMENT_OTHER): Payer: BLUE CROSS/BLUE SHIELD

## 2015-04-26 DIAGNOSIS — D539 Nutritional anemia, unspecified: Secondary | ICD-10-CM

## 2015-04-26 DIAGNOSIS — E8809 Other disorders of plasma-protein metabolism, not elsewhere classified: Secondary | ICD-10-CM

## 2015-04-26 LAB — COMPREHENSIVE METABOLIC PANEL (CC13)
ALT: 12 U/L (ref 0–55)
AST: 19 U/L (ref 5–34)
Albumin: 4 g/dL (ref 3.5–5.0)
Alkaline Phosphatase: 87 U/L (ref 40–150)
Anion Gap: 10 mEq/L (ref 3–11)
BUN: 9.4 mg/dL (ref 7.0–26.0)
CO2: 21 mEq/L — ABNORMAL LOW (ref 22–29)
CREATININE: 1.4 mg/dL — AB (ref 0.7–1.3)
Calcium: 9 mg/dL (ref 8.4–10.4)
Chloride: 109 mEq/L (ref 98–109)
EGFR: 60 mL/min/{1.73_m2} — ABNORMAL LOW (ref >90)
GLUCOSE: 98 mg/dL (ref 70–140)
POTASSIUM: 4 meq/L (ref 3.5–5.1)
SODIUM: 141 meq/L (ref 136–145)
Total Bilirubin: 0.43 mg/dL (ref 0.20–1.20)
Total Protein: 7.6 g/dL (ref 6.4–8.3)

## 2015-04-26 LAB — CBC WITH DIFFERENTIAL/PLATELET
BASO%: 1.1 % (ref 0.0–2.0)
BASOS ABS: 0.1 10*3/uL (ref 0.0–0.1)
EOS%: 7.1 % — AB (ref 0.0–7.0)
Eosinophils Absolute: 0.4 10*3/uL (ref 0.0–0.5)
HCT: 36.5 % — ABNORMAL LOW (ref 38.4–49.9)
HEMOGLOBIN: 12.1 g/dL — AB (ref 13.0–17.1)
LYMPH#: 1.8 10*3/uL (ref 0.9–3.3)
LYMPH%: 34.5 % (ref 14.0–49.0)
MCH: 31.6 pg (ref 27.2–33.4)
MCHC: 33 g/dL (ref 32.0–36.0)
MCV: 95.8 fL (ref 79.3–98.0)
MONO#: 0.6 10*3/uL (ref 0.1–0.9)
MONO%: 12.7 % (ref 0.0–14.0)
NEUT#: 2.3 10*3/uL (ref 1.5–6.5)
NEUT%: 44.6 % (ref 39.0–75.0)
Platelets: 238 10*3/uL (ref 140–400)
RBC: 3.81 10*6/uL — ABNORMAL LOW (ref 4.20–5.82)
RDW: 15.7 % — AB (ref 11.0–14.6)
WBC: 5.1 10*3/uL (ref 4.0–10.3)

## 2015-04-26 LAB — LACTATE DEHYDROGENASE (CC13): LDH: 155 U/L (ref 125–245)

## 2015-04-30 LAB — KAPPA/LAMBDA LIGHT CHAINS
Kappa free light chain: 1.85 mg/dL (ref 0.33–1.94)
Kappa:Lambda Ratio: 0.48 (ref 0.26–1.65)
LAMBDA FREE LGHT CHN: 3.85 mg/dL — AB (ref 0.57–2.63)

## 2015-04-30 LAB — BETA 2 MICROGLOBULIN, SERUM: Beta-2 Microglobulin: 2.3 mg/L (ref <=2.51)

## 2015-04-30 LAB — SPEP & IFE WITH QIG
ALBUMIN ELP: 4.1 g/dL (ref 3.8–4.8)
ALPHA-1-GLOBULIN: 0.3 g/dL (ref 0.2–0.3)
Abnormal Protein Band1: 0.9 g/dL
Alpha-2-Globulin: 0.6 g/dL (ref 0.5–0.9)
BETA 2: 0.3 g/dL (ref 0.2–0.5)
Beta Globulin: 0.5 g/dL (ref 0.4–0.6)
Gamma Globulin: 1.7 g/dL (ref 0.8–1.7)
IGG (IMMUNOGLOBIN G), SERUM: 915 mg/dL (ref 650–1600)
IGM, SERUM: 26 mg/dL — AB (ref 41–251)
IgA: 611 mg/dL — ABNORMAL HIGH (ref 68–379)
Total Protein, Serum Electrophoresis: 7.4 g/dL (ref 6.1–8.1)

## 2015-04-30 LAB — VITAMIN B12: Vitamin B-12: 487 pg/mL (ref 211–911)

## 2015-04-30 LAB — FOLATE: Folate: 12.8 ng/mL

## 2015-05-03 ENCOUNTER — Telehealth: Payer: Self-pay | Admitting: Internal Medicine

## 2015-05-03 ENCOUNTER — Encounter: Payer: Self-pay | Admitting: Internal Medicine

## 2015-05-03 ENCOUNTER — Ambulatory Visit (HOSPITAL_BASED_OUTPATIENT_CLINIC_OR_DEPARTMENT_OTHER): Payer: BLUE CROSS/BLUE SHIELD | Admitting: Internal Medicine

## 2015-05-03 VITALS — BP 154/85 | HR 64 | Temp 98.0°F | Resp 17 | Ht 77.0 in | Wt 230.1 lb

## 2015-05-03 DIAGNOSIS — E8809 Other disorders of plasma-protein metabolism, not elsewhere classified: Secondary | ICD-10-CM | POA: Diagnosis not present

## 2015-05-03 NOTE — Telephone Encounter (Signed)
Pt confirmed labs/ov per 08/25 POF, gave pt AVS and Calendar... KJ °

## 2015-05-03 NOTE — Progress Notes (Signed)
[facility] Telephone:(336) [phone]   Fax:(336) [phone]  OFFICE PROGRESS NOTE  [name], MD [place] [address]  DIAGNOSIS: Plasma cell dyscrasia diagnosed in March of 2013   PRIOR THERAPY: None   CURRENT THERAPY: Observation  INTERVAL HISTORY: Darren Hinton. 69 y.o. male returns to the clinic today for 6 months follow up visit accompanied his wife. The patient is feeling fine today with no specific complaints except for some hearing deficit. He has evaluation at the [facility] hospital next month for his healing problem. He denied having any significant weight loss or night sweats but has no chest pain, shortness of breath, cough or hemoptysis. He has no significant nausea or vomiting, no fever or chills. The patient has repeat myeloma panel performed recently and he is here for evaluation and discussion of his lab results.   MEDICAL HISTORY: Past Medical History  Diagnosis Date  . Thyroid disease   . Hyperlipidemia   . Hypothyroidism   . Paroxysmal atrial fibrillation   . Mitral valve prolapse   . Hyperplastic colon polyp   . Hypertension   . Hyperproteinemia   . Allergy   . Anxiety   . Heart murmur     ALLERGIES:  has No Known Allergies.  MEDICATIONS:  Current Outpatient Prescriptions  Medication Sig Dispense Refill  . ALPRAZolam (XANAX) 0.25 MG tablet Take 0.25 mg by mouth daily as needed. Anxiety     . amLODipine (NORVASC) 5 MG tablet     . aspirin 325 MG tablet Take 325 mg by mouth every morning.     . cromolyn (NASALCROM) 5.2 MG/ACT nasal spray Place 1 spray into the nose 3 (three) times daily as needed. Allergies     . fluticasone (FLONASE) 50 MCG/ACT nasal spray Place 2 sprays into the nose daily as needed. Allergies     . HYDROcodone-homatropine (HYCODAN) 5-1.5 MG/5ML syrup   0  . ipratropium (ATROVENT) 0.06 % nasal spray Place 2 sprays into the nose 3 (three) times daily as needed. Allergies     . levothyroxine  (SYNTHROID, LEVOTHROID) 125 MCG tablet Take 125 mcg by mouth every morning. One daily  except Sunday    . meclizine (ANTIVERT) 25 MG tablet Take 1 tablet (25 mg total) by mouth 3 (three) times daily as needed for dizziness. 30 tablet 0  . propranolol (INDERAL) 10 MG tablet Take 10 mg by mouth as needed. Prior to dental surgery for anxiety    . simvastatin (ZOCOR) 40 MG tablet Take 40 mg by mouth every morning.     . TOPROL XL 100 MG 24 hr tablet Take 100 mg by mouth daily.    Marland Kitchen ipratropium (ATROVENT) 0.03 % nasal spray PLACE 1 SPRAY INTO EACH NOSTRIL UP TO 3 TIMES DAILY AS NEEDED FOR CONGESTION  5   No current facility-administered medications for this visit.    REVIEW OF SYSTEMS:  A comprehensive review of systems was negative.   PHYSICAL EXAMINATION: General appearance: alert, cooperative and no distress Head: Normocephalic, without obvious abnormality, atraumatic Neck: no adenopathy Lymph nodes: Cervical, supraclavicular, and axillary nodes normal. Resp: clear to auscultation bilaterally Cardio: regular rate and rhythm, S1, S2 normal, no murmur, click, rub or gallop GI: soft, non-tender; bowel sounds normal; no masses,  no organomegaly Extremities: extremities normal, atraumatic, no cyanosis or edema  ECOG PERFORMANCE STATUS: 1 - Symptomatic but completely ambulatory  Blood pressure 154/85, pulse 64, temperature 98 F (36.7 C), temperature source  Oral, resp. rate 17, height 6\' 5"  (1.956 m), weight 230 lb 1.6 oz (104.373 kg), SpO2 100 %.  LABORATORY DATA: Lab Results  Component Value Date   WBC 5.1 04/26/2015   HGB 12.1* 04/26/2015   HCT 36.5* 04/26/2015   MCV 95.8 04/26/2015   PLT 238 04/26/2015      Chemistry      Component Value Date/Time   NA 141 04/26/2015 0930   NA 140 03/12/2014 1643   K 4.0 04/26/2015 0930   K 4.0 03/12/2014 1643   CL 102 03/12/2014 1643   CL 105 10/01/2012 0817   CO2 21* 04/26/2015 0930   CO2 22 03/12/2014 1643   BUN 9.4 04/26/2015 0930    BUN 9 03/12/2014 1643   CREATININE 1.4* 04/26/2015 0930   CREATININE 1.23 03/12/2014 1643   CREATININE 1.31 03/20/2013 1014      Component Value Date/Time   CALCIUM 9.0 04/26/2015 0930   CALCIUM 9.0 03/12/2014 1643   ALKPHOS 87 04/26/2015 0930   ALKPHOS 88 03/20/2013 1014   AST 19 04/26/2015 0930   AST 23 03/20/2013 1014   ALT 12 04/26/2015 0930   ALT 18 03/20/2013 1014   BILITOT 0.43 04/26/2015 0930   BILITOT 0.5 03/20/2013 1014     Other lab results: Beta-2 microglobulin 2.30, free kappa light chain 1.85, free lambda light chain 3.85 with a kappa/lambda ratio of 0.48. IgG 915, IgA 611 and IgM 26.   RADIOGRAPHIC STUDIES: No results found.  ASSESSMENT AND PLAN: this is a very pleasant 69 years old [name] with history of plasma cell dyscrasia, currently on observation for more than 3 years. His myeloma panel showed stable disease I discussed the lab result with the patient. I recommended for him to continue on observation with repeat myeloma panel in 6 months. The patient was advised to call immediately she has any concerning symptoms in the interval.  The patient voices understanding of current disease status and treatment options and is in agreement with the current care plan.  All questions were answered. The patient knows to call the clinic with any problems, questions or concerns. We can certainly see the patient much sooner if necessary.  Disclaimer: This note was dictated with voice recognition software. Similar sounding words can inadvertently be transcribed and may not be corrected upon review.

## 2015-06-04 ENCOUNTER — Telehealth: Payer: Self-pay

## 2015-06-04 NOTE — Telephone Encounter (Signed)
Patient called into MR voicemail on Monday 06/04/15 at 8:53am requesting his records for when he was seen for Verit[place] to be sent to the [facility] benefits department. Not sure of the number so we need to call him back and see if he can give [place] a fax number for them. His call back number is [phone] (home) [phone] (cell)

## 2015-06-07 NOTE — Telephone Encounter (Signed)
Patient returned phone call and left fax number [phone]. Records faxed.

## 2015-06-07 NOTE — Telephone Encounter (Signed)
Left message on cell# to call back with phone number and fax so we can send records. Verbal request form in releases folder.

## 2015-10-26 ENCOUNTER — Other Ambulatory Visit: Payer: BLUE CROSS/BLUE SHIELD

## 2015-11-01 ENCOUNTER — Telehealth: Payer: Self-pay | Admitting: Internal Medicine

## 2015-11-01 ENCOUNTER — Ambulatory Visit: Payer: BLUE CROSS/BLUE SHIELD | Admitting: Internal Medicine

## 2015-11-01 NOTE — Telephone Encounter (Signed)
Wife called to cxd pt appts 2/23

## 2016-04-14 DIAGNOSIS — Z6828 Body mass index (BMI) 28.0-28.9, adult: Secondary | ICD-10-CM | POA: Diagnosis not present

## 2016-04-14 DIAGNOSIS — I480 Paroxysmal atrial fibrillation: Secondary | ICD-10-CM | POA: Diagnosis not present

## 2016-04-14 DIAGNOSIS — I10 Essential (primary) hypertension: Secondary | ICD-10-CM | POA: Diagnosis not present

## 2016-04-14 DIAGNOSIS — H8110 Benign paroxysmal vertigo, unspecified ear: Secondary | ICD-10-CM | POA: Diagnosis not present

## 2016-04-14 DIAGNOSIS — R42 Dizziness and giddiness: Secondary | ICD-10-CM | POA: Diagnosis not present

## 2016-07-08 DIAGNOSIS — J309 Allergic rhinitis, unspecified: Secondary | ICD-10-CM | POA: Diagnosis not present

## 2016-07-08 DIAGNOSIS — I480 Paroxysmal atrial fibrillation: Secondary | ICD-10-CM | POA: Diagnosis not present

## 2016-07-08 DIAGNOSIS — I10 Essential (primary) hypertension: Secondary | ICD-10-CM | POA: Diagnosis not present

## 2016-07-08 DIAGNOSIS — Z6828 Body mass index (BMI) 28.0-28.9, adult: Secondary | ICD-10-CM | POA: Diagnosis not present

## 2016-07-08 DIAGNOSIS — R69 Illness, unspecified: Secondary | ICD-10-CM | POA: Diagnosis not present

## 2016-07-08 MED FILL — IPRATROPIUM 0.03% SPRAY: 0.03 | 30 days supply | Qty: 30 | Fill #0

## 2016-09-30 DIAGNOSIS — E784 Other hyperlipidemia: Secondary | ICD-10-CM | POA: Diagnosis not present

## 2016-09-30 DIAGNOSIS — Z125 Encounter for screening for malignant neoplasm of prostate: Secondary | ICD-10-CM | POA: Diagnosis not present

## 2016-09-30 DIAGNOSIS — D538 Other specified nutritional anemias: Secondary | ICD-10-CM | POA: Diagnosis not present

## 2016-09-30 DIAGNOSIS — I10 Essential (primary) hypertension: Secondary | ICD-10-CM | POA: Diagnosis not present

## 2016-09-30 DIAGNOSIS — E038 Other specified hypothyroidism: Secondary | ICD-10-CM | POA: Diagnosis not present

## 2016-10-07 DIAGNOSIS — Z0000 Encounter for general adult medical examination without abnormal findings: Secondary | ICD-10-CM | POA: Diagnosis not present

## 2016-10-07 DIAGNOSIS — I10 Essential (primary) hypertension: Secondary | ICD-10-CM | POA: Diagnosis not present

## 2016-10-07 DIAGNOSIS — M545 Low back pain: Secondary | ICD-10-CM | POA: Diagnosis not present

## 2016-10-07 DIAGNOSIS — E038 Other specified hypothyroidism: Secondary | ICD-10-CM | POA: Diagnosis not present

## 2016-10-07 DIAGNOSIS — I480 Paroxysmal atrial fibrillation: Secondary | ICD-10-CM | POA: Diagnosis not present

## 2016-10-07 DIAGNOSIS — N183 Chronic kidney disease, stage 3 (moderate): Secondary | ICD-10-CM | POA: Diagnosis not present

## 2016-10-07 DIAGNOSIS — E784 Other hyperlipidemia: Secondary | ICD-10-CM | POA: Diagnosis not present

## 2016-10-07 DIAGNOSIS — R69 Illness, unspecified: Secondary | ICD-10-CM | POA: Diagnosis not present

## 2016-10-07 DIAGNOSIS — D472 Monoclonal gammopathy: Secondary | ICD-10-CM | POA: Diagnosis not present

## 2017-05-25 DIAGNOSIS — Z6827 Body mass index (BMI) 27.0-27.9, adult: Secondary | ICD-10-CM | POA: Diagnosis not present

## 2017-05-25 DIAGNOSIS — J069 Acute upper respiratory infection, unspecified: Secondary | ICD-10-CM | POA: Diagnosis not present

## 2017-05-25 DIAGNOSIS — R05 Cough: Secondary | ICD-10-CM | POA: Diagnosis not present

## 2017-10-01 DIAGNOSIS — I10 Essential (primary) hypertension: Secondary | ICD-10-CM | POA: Diagnosis not present

## 2017-10-01 DIAGNOSIS — R82998 Other abnormal findings in urine: Secondary | ICD-10-CM | POA: Diagnosis not present

## 2017-10-01 DIAGNOSIS — E038 Other specified hypothyroidism: Secondary | ICD-10-CM | POA: Diagnosis not present

## 2017-10-01 DIAGNOSIS — E7849 Other hyperlipidemia: Secondary | ICD-10-CM | POA: Diagnosis not present

## 2017-10-01 DIAGNOSIS — D538 Other specified nutritional anemias: Secondary | ICD-10-CM | POA: Diagnosis not present

## 2017-10-01 DIAGNOSIS — Z125 Encounter for screening for malignant neoplasm of prostate: Secondary | ICD-10-CM | POA: Diagnosis not present

## 2017-10-08 DIAGNOSIS — Z23 Encounter for immunization: Secondary | ICD-10-CM | POA: Diagnosis not present

## 2017-10-08 DIAGNOSIS — I10 Essential (primary) hypertension: Secondary | ICD-10-CM | POA: Diagnosis not present

## 2017-10-08 DIAGNOSIS — E7849 Other hyperlipidemia: Secondary | ICD-10-CM | POA: Diagnosis not present

## 2017-10-08 DIAGNOSIS — Z0000 Encounter for general adult medical examination without abnormal findings: Secondary | ICD-10-CM | POA: Diagnosis not present

## 2017-10-08 DIAGNOSIS — E038 Other specified hypothyroidism: Secondary | ICD-10-CM | POA: Diagnosis not present

## 2017-10-08 DIAGNOSIS — R69 Illness, unspecified: Secondary | ICD-10-CM | POA: Diagnosis not present

## 2017-10-08 DIAGNOSIS — I480 Paroxysmal atrial fibrillation: Secondary | ICD-10-CM | POA: Diagnosis not present

## 2017-10-08 DIAGNOSIS — D472 Monoclonal gammopathy: Secondary | ICD-10-CM | POA: Diagnosis not present

## 2017-10-08 DIAGNOSIS — M545 Low back pain: Secondary | ICD-10-CM | POA: Diagnosis not present

## 2017-10-09 ENCOUNTER — Encounter: Payer: Self-pay | Admitting: Gastroenterology

## 2017-10-12 DIAGNOSIS — Z1212 Encounter for screening for malignant neoplasm of rectum: Secondary | ICD-10-CM | POA: Diagnosis not present

## 2017-11-18 ENCOUNTER — Encounter: Payer: Self-pay | Admitting: Gastroenterology

## 2017-11-18 ENCOUNTER — Other Ambulatory Visit: Payer: Self-pay

## 2017-11-18 ENCOUNTER — Ambulatory Visit (AMBULATORY_SURGERY_CENTER): Payer: Self-pay

## 2017-11-18 VITALS — Ht 77.0 in | Wt 231.0 lb

## 2017-11-18 DIAGNOSIS — Z86010 Personal history of colonic polyps: Secondary | ICD-10-CM

## 2017-11-18 MED ORDER — NA SULFATE-K SULFATE-MG SULF 17.5-3.13-1.6 GM/177ML PO SOLN: 1.0000 | Freq: Once | ORAL | 0 refills | Status: AC

## 2017-11-18 NOTE — Progress Notes (Signed)
Denies allergies to eggs or soy products. Denies complication of anesthesia or sedation. Denies use of weight loss medication. Denies use of O2.   Emmi instructions declined.  

## 2017-11-30 ENCOUNTER — Telehealth: Payer: Self-pay | Admitting: Gastroenterology

## 2017-11-30 NOTE — Telephone Encounter (Signed)
If he still wants to do it, I think okay to proceed on 3/27 if he just stays on a clear liquid diet tomorrow 3/26 and we can give him guidance on 1/2 miralax prep to use in addition to the regular prep if needed. Thanks

## 2017-11-30 NOTE — Telephone Encounter (Signed)
Hi Dr. Havery Moros, this pt just cancelled his colon scheduled on 12/02/17 because he did not follow the diet for the 5 days before his procedure. He has re-scheduled for 12/15/17. Thank you.

## 2017-12-01 NOTE — Telephone Encounter (Signed)
Okay. Thanks for letting me know!

## 2017-12-01 NOTE — Telephone Encounter (Signed)
Dr. Havery Moros, unfortunately I was not able to contact patient to give him your message in time. However, when I spoke with him initially he felt more comfortable re-scheduling procedure. Thank you.

## 2017-12-02 ENCOUNTER — Encounter: Payer: BLUE CROSS/BLUE SHIELD | Admitting: Gastroenterology

## 2017-12-15 ENCOUNTER — Encounter: Payer: Self-pay | Admitting: Gastroenterology

## 2017-12-15 ENCOUNTER — Ambulatory Visit (AMBULATORY_SURGERY_CENTER): Payer: Medicare HMO | Admitting: Gastroenterology

## 2017-12-15 ENCOUNTER — Other Ambulatory Visit: Payer: Self-pay

## 2017-12-15 VITALS — BP 137/69 | HR 51 | Temp 99.1°F | Resp 16 | Ht 77.0 in | Wt 231.0 lb

## 2017-12-15 DIAGNOSIS — Z1211 Encounter for screening for malignant neoplasm of colon: Secondary | ICD-10-CM | POA: Diagnosis present

## 2017-12-15 DIAGNOSIS — Z1212 Encounter for screening for malignant neoplasm of rectum: Secondary | ICD-10-CM

## 2017-12-15 DIAGNOSIS — Z86010 Personal history of colonic polyps: Secondary | ICD-10-CM | POA: Diagnosis not present

## 2017-12-15 MED ORDER — SODIUM CHLORIDE 0.9 % IV SOLN: 500.0000 mL | Freq: Once | INTRAVENOUS | Status: DC

## 2017-12-15 NOTE — Patient Instructions (Signed)
*  Handout given to patient on hemorrhoids and advised pt of banding performed in clinic on 3rd floor if interested.  YOU HAD AN ENDOSCOPIC PROCEDURE TODAY AT [facility]:   Refer to the procedure report that was given to you for any specific questions about what was found during the examination.  If the procedure report does not answer your questions, please call your gastroenterologist to clarify.  If you requested that your care partner not be given the details of your procedure findings, then the procedure report has been included in a sealed envelope for you to review at your convenience later.  YOU SHOULD EXPECT: Some feelings of bloating in the abdomen. Passage of more gas than usual.  Walking can help get rid of the air that was put into your GI tract during the procedure and reduce the bloating. If you had a lower endoscopy (such as a colonoscopy or flexible sigmoidoscopy) you may notice spotting of blood in your stool or on the toilet paper. If you underwent a bowel prep for your procedure, you may not have a normal bowel movement for a few days.  Please Note:  You might notice some irritation and congestion in your nose or some drainage.  This is from the oxygen used during your procedure.  There is no need for concern and it should clear up in a day or so.  SYMPTOMS TO REPORT IMMEDIATELY:   Following lower endoscopy (colonoscopy or flexible sigmoidoscopy):  Excessive amounts of blood in the stool  Significant tenderness or worsening of abdominal pains  Swelling of the abdomen that is new, acute  Fever of 100F or higher   For urgent or emergent issues, a gastroenterologist can be reached at any hour by calling [phone].   DIET:  We do recommend a small meal at first, but then you may proceed to your regular diet.  Drink plenty of fluids but you should avoid alcoholic beverages for 24 hours.  ACTIVITY:  You should plan to take it easy for the rest of today and  you should NOT DRIVE or use heavy machinery until tomorrow (because of the sedation medicines used during the test).    FOLLOW UP: Our staff will call the number listed on your records the next business day following your procedure to check on you and address any questions or concerns that you may have regarding the information given to you following your procedure. If we do not reach you, we will leave a message.  However, if you are feeling well and you are not experiencing any problems, there is no need to return our call.  We will assume that you have returned to your regular daily activities without incident.  If any biopsies were taken you will be contacted by phone or by letter within the next 1-3 weeks.  Please call us at [phone] if you have not heard about the biopsies in 3 weeks.    SIGNATURES/CONFIDENTIALITY: You and/or your care partner have signed paperwork which will be entered into your electronic medical record.  These signatures attest to the fact that that the information above on your After Visit Summary has been reviewed and is understood.  Full responsibility of the confidentiality of this discharge information lies with you and/or your care-partner.

## 2017-12-15 NOTE — Progress Notes (Signed)
Spontaneous respirations throughout. VSS. Resting comfortably. To PACU on room air. Report to  RN. 

## 2017-12-15 NOTE — Progress Notes (Signed)
Pt's states no medical or surgical changes since previsit or office visit. 

## 2017-12-15 NOTE — Op Note (Signed)
[facility] Patient Name: Darren Hinton Procedure Date: 12/15/2017 7:56 AM MRN: [id number] Endoscopist: Remo Lipps P. Annice Jolly MD, MD Age: 72 Referring MD:  Date of Birth: [birth year] Gender: Male Account #: [id number] Procedure:                Colonoscopy Indications:              Screening for colorectal malignant neoplasm Medicines:                Monitored Anesthesia Care Procedure:                Pre-Anesthesia Assessment:                           - Prior to the procedure, a History and Physical                            was performed, and patient medications and                            allergies were reviewed. The patient's tolerance of                            previous anesthesia was also reviewed. The risks                            and benefits of the procedure and the sedation                            options and risks were discussed with the patient.                            All questions were answered, and informed consent                            was obtained. Prior Anticoagulants: The patient has                            taken no previous anticoagulant or antiplatelet                            agents. ASA Grade Assessment: II - A patient with                            mild systemic disease. After reviewing the risks                            and benefits, the patient was deemed in                            satisfactory condition to undergo the procedure.                           After obtaining informed consent, the colonoscope  was passed under direct vision. Throughout the                            procedure, the patient's blood pressure, pulse, and                            oxygen saturations were monitored continuously. The                            Model CF-HQ190L [id number]) scope was introduced                            through the anus and advanced to the the cecum,                            identified  by appendiceal orifice and ileocecal                            valve. The colonoscopy was performed without                            difficulty. The patient tolerated the procedure                            well. The quality of the bowel preparation was                            adequate. The ileocecal valve, appendiceal orifice,                            and rectum were photographed. Scope In: 8:04:10 AM Scope Out: 8:25:56 AM Scope Withdrawal Time: 0 hours 18 minutes 32 seconds  Total Procedure Duration: 0 hours 21 minutes 46 seconds  Findings:                 The perianal and digital rectal examinations were                            normal.                           A single small angiodysplastic lesion was found in                            the ascending colon.                           The sigmoid colon was redundant. Time was taken to                            lavage the colon given a lot of residual liquid                            stool, but adequate views were obtained.  Internal hemorrhoids were found during retroflexion.                           The exam was otherwise without abnormality. Complications:            No immediate complications. Estimated blood loss:                            Minimal. Estimated Blood Loss:     Estimated blood loss was minimal. Estimated blood                            loss: none. Impression:               - Redundant colon.                           - Internal hemorrhoids.                           - The examination was otherwise normal.                           - No polyps Recommendation:           - Patient has a contact number available for                            emergencies. The signs and symptoms of potential                            delayed complications were discussed with the                            patient. Return to normal activities tomorrow.                            Written discharge  instructions were provided to the                            patient.                           - Resume previous diet.                           - Continue present medications.                           - Normally recommend a repeat colonoscopy in 10                            years for screening purposes after a normal exam,                            however at that time you will be 72 years old, an  age at which routine screening is not performed. In                            this light, I do not think you warrant any further                            colon cancer screening. Remo Lipps P. Taliesin Hartlage MD, MD 12/15/2017 8:31:59 AM This report has been signed electronically.

## 2017-12-16 ENCOUNTER — Telehealth: Payer: Self-pay | Admitting: *Deleted

## 2017-12-16 NOTE — Telephone Encounter (Signed)
  Follow up Call-  Call back number 12/15/2017  Post procedure Call Back phone  # [phone]  Permission to leave phone message Yes  Some recent data might be hidden     Patient questions:  Do you have a fever, pain , or abdominal swelling? No. Pain Score  0 *  Have you tolerated food without any problems? Yes.    Have you been able to return to your normal activities? Yes.    Do you have any questions about your discharge instructions: Diet   No. Medications  No. Follow up visit  No.  Do you have questions or concerns about your Care? No.  Actions: * If pain score is 4 or above: No action needed, pain <4.

## 2020-03-26 ENCOUNTER — Ambulatory Visit (INDEPENDENT_AMBULATORY_CARE_PROVIDER_SITE_OTHER): Payer: Medicare Other

## 2020-03-26 ENCOUNTER — Other Ambulatory Visit: Payer: Self-pay

## 2020-03-26 ENCOUNTER — Encounter: Payer: Self-pay | Admitting: Podiatry

## 2020-03-26 ENCOUNTER — Ambulatory Visit: Payer: Medicare Other | Admitting: Podiatry

## 2020-03-26 DIAGNOSIS — M2021 Hallux rigidus, right foot: Secondary | ICD-10-CM

## 2020-03-26 DIAGNOSIS — M2022 Hallux rigidus, left foot: Secondary | ICD-10-CM | POA: Diagnosis not present

## 2020-03-26 DIAGNOSIS — M2042 Other hammer toe(s) (acquired), left foot: Secondary | ICD-10-CM | POA: Diagnosis not present

## 2020-03-26 DIAGNOSIS — M21612 Bunion of left foot: Secondary | ICD-10-CM

## 2020-03-26 DIAGNOSIS — M2041 Other hammer toe(s) (acquired), right foot: Secondary | ICD-10-CM

## 2020-03-26 DIAGNOSIS — M21611 Bunion of right foot: Secondary | ICD-10-CM

## 2020-03-26 NOTE — Progress Notes (Signed)
Subjective:  Patient ID: Darren Hinton., male    DOB: 06-07-1946,  MRN: [id number]  Chief Complaint  Patient presents with  . Foot Problem    Pt wants to discuss possible bilateral bunions and hammertoes.     74 y.o. male presents with the above complaint.  Patient presents with a complaint of bilateral bunions with second digit hammertoe.  Crossover deformity with left worse than right.  Patient states that this has been going on for quite some time.  Patient was enlisted in [employer] with pes planus foot structure for which the bunions and hammertoes started right after.  Patient states that he did has mild to moderate pain sometimes however it is not debilitating it is not causing him any pain.  He is here for second opinion separate from the [place] to make sure that there was nothing else going on.  He denies any other acute complaints.  He denies seeing anyone else outside from the family.  Review of Systems: Negative except as noted in the HPI. Denies N/V/F/Ch.  Past Medical History:  Diagnosis Date  . Allergy   . Anxiety   . Arthritis   . Depression   . Heart murmur   . Hyperlipidemia   . Hyperplastic colon polyp   . Hyperproteinemia   . Hypertension   . Hypothyroidism   . Mitral valve prolapse   . Paroxysmal atrial fibrillation (HCC)   . Thyroid disease     Current Outpatient Medications:  .  ALPRAZolam (XANAX) 0.25 MG tablet, Take 0.25 mg by mouth daily as needed. Anxiety , Disp: , Rfl:  .  amLODipine (NORVASC) 5 MG tablet, , Disp: , Rfl:  .  aspirin 325 MG tablet, Take 325 mg by mouth every morning. , Disp: , Rfl:  .  atorvastatin (LIPITOR) 20 MG tablet, Take 20 mg by mouth daily., Disp: , Rfl:  .  cromolyn (NASALCROM) 5.2 MG/ACT nasal spray, Place 1 spray into the nose 3 (three) times daily as needed. Allergies , Disp: , Rfl:  .  fluticasone (FLONASE) 50 MCG/ACT nasal spray, Place 2 sprays into the nose daily as needed. Allergies , Disp: , Rfl:  .  ipratropium  (ATROVENT) 0.03 % nasal spray, PLACE 1 SPRAY INTO EACH NOSTRIL UP TO 3 TIMES DAILY AS NEEDED FOR CONGESTION, Disp: , Rfl: 5 .  ipratropium (ATROVENT) 0.06 % nasal spray, Place 2 sprays into the nose 3 (three) times daily as needed. Allergies , Disp: , Rfl:  .  levothyroxine (SYNTHROID, LEVOTHROID) 125 MCG tablet, Take 125 mcg by mouth every morning. One daily  except Sunday, Disp: , Rfl:  .  meclizine (ANTIVERT) 25 MG tablet, Take 1 tablet (25 mg total) by mouth 3 (three) times daily as needed for dizziness., Disp: 30 tablet, Rfl: 0 .  TOPROL XL 100 MG 24 hr tablet, Take 100 mg by mouth daily., Disp: , Rfl:   Current Facility-Administered Medications:  .  0.9 %  sodium chloride infusion, 500 mL, Intravenous, Once, Armbruster, Carlota Raspberry, MD  Social History   Tobacco Use  Smoking Status Former Smoker  . Years: 20.00  . Types: Cigarettes  . Quit date: 10/01/1978  . Years since quitting: 41.5  Smokeless Tobacco Never Used    No Known Allergies Objective:  There were no vitals filed for this visit. There is no height or weight on file to calculate BMI. Constitutional Well developed. Well nourished.  Vascular Dorsalis pedis pulses palpable bilaterally. Posterior tibial pulses palpable bilaterally.  Capillary refill normal to all digits.  No cyanosis or clubbing noted. Pedal hair growth normal.  Neurologic Normal speech. Oriented to person, place, and time. Epicritic sensation to light touch grossly present bilaterally.  Dermatologic Nails well groomed and normal in appearance. No open wounds. No skin lesions.  Orthopedic: Normal joint ROM without pain or crepitus bilaterally. Hallux abductovalgus deformity present crepitus noted intra-articular first metatarsophalangeal joint with range of motion.  Very rigidus range of motion available at bilateral hallux Left 1st MPJ diminished range of motion. Left 1st TMT without gross hypermobility. Right 1st MPJ diminished range of motion   Right 1st TMT without gross hypermobility. Lesser digital contractures present bilaterally.  With rigid deformity of the second digit hammertoe contracture rigid in nature.  Predislocation syndrome noted to bilateral hammertoes.   Radiographs: Taken and reviewed  Hallux valgus bunion deformity noted bilaterally with severe arthrosis noted to bilateral first metatarsophalangeal joint.  Hammertoe contracture of 2 through 5 noted with second being more severe.  Sagittal plane showed elevation of the second digit consistent with hammertoe findings.  No other bony abnormalities identified.  Severe pes planus foot structure noted.   Assessment:   1. Hammer toes, bilateral   2. Bilateral bunions   3. Hallux rigidus of both feet    Plan:  Patient was evaluated and treated and all questions answered.  Hallux abductovalgus deformity bilateral with left greater than right with underlying severe arthrosis with hallux rigidus secondary to pes planus deformity -I discussed with the patient the etiology of bunion deformity with underlying arthritis status and pes planus and various treatment options were extensively discussed.  I discussed with him that the driving forces the underlying pes planus deformity bilateral bunion deformity and hammertoe contractures.  Given the rigid and severe nature of it I explained to the patient that unfortunately most conservative treatment options will not be very helpful.  I believe that he will benefit from a surgical intervention in the future with first MPJ fusion and hammertoe correction of the second digit.  However given that his pain is mild to moderate we will hold off any surgical intervention for now.  He will come back and see me when it becomes more severe and more debilitating.  Patient states understanding.  No follow-ups on file.

## 2020-03-29 ENCOUNTER — Other Ambulatory Visit: Payer: Self-pay | Admitting: Podiatry

## 2020-03-29 DIAGNOSIS — M2021 Hallux rigidus, right foot: Secondary | ICD-10-CM

## 2020-07-21 ENCOUNTER — Other Ambulatory Visit: Payer: Self-pay

## 2020-07-21 ENCOUNTER — Ambulatory Visit: Payer: Medicare Other | Attending: Internal Medicine

## 2020-07-21 DIAGNOSIS — Z23 Encounter for immunization: Secondary | ICD-10-CM

## 2020-07-21 NOTE — Progress Notes (Signed)
   [id number]  Name:  Darren Hinton.    MRN: [id number] DOB: February 22, 1946  07/21/2020  Mr. Darren Hinton was observed post Covid-19 immunization for 15 minutes without incident. He was provided with Vaccine Information Sheet and instruction to access the V-Safe system.   Mr. Darren Hinton was instructed to call 911 with any severe reactions post vaccine: Marland Kitchen Difficulty breathing  . Swelling of face and throat  . A fast heartbeat  . A bad rash all over body  . Dizziness and weakness   Immunizations Administered    No immunizations on file.

## 2021-12-02 ENCOUNTER — Telehealth: Payer: Self-pay | Admitting: Hematology and Oncology

## 2021-12-02 NOTE — Telephone Encounter (Signed)
Scheduled appt per 3/23 referral. Pt is aware of appt date and time. Pt is aware to arrive 15 mins prior to appt time and to bring and updated insurance card. Pt is aware of appt location.   ?

## 2021-12-12 ENCOUNTER — Other Ambulatory Visit: Payer: Self-pay | Admitting: Internal Medicine

## 2021-12-12 DIAGNOSIS — D472 Monoclonal gammopathy: Secondary | ICD-10-CM

## 2021-12-12 DIAGNOSIS — N179 Acute kidney failure, unspecified: Secondary | ICD-10-CM

## 2021-12-12 DIAGNOSIS — E039 Hypothyroidism, unspecified: Secondary | ICD-10-CM

## 2021-12-12 DIAGNOSIS — I129 Hypertensive chronic kidney disease with stage 1 through stage 4 chronic kidney disease, or unspecified chronic kidney disease: Secondary | ICD-10-CM

## 2021-12-22 NOTE — Progress Notes (Signed)
?[name] ?[phone]   [phone] ? ?INITIAL CONSULT NOTE ? ?Patient Care Team: ?[name], MD as PCP - General (Internal Medicine) ? ?Hematological/Oncological History ?# IgA Lambda Monoclonal Gammopathy, concern for Multiple Myeloma ?05/03/2015: last visit with Dr. Julien Nordmann at the [facility]. Was followed for IgA lambda MGUS. ?12/11/2021: labs show M protein 3.4, Kappa 19.2, Lambda 1576.4, ratio 0.01. Cr 3.47, Hgb 8.6, WBC 5.7, MCV 99, Plt 221 ?12/23/2021: establish care with Dr. [name]  ? ?CHIEF COMPLAINTS/PURPOSE OF CONSULTATION:  ?" IgA Lambda Monoclonal Gammopathy " ? ?HISTORY OF PRESENTING ILLNESS:  ?Darren Hinton. 76 y.o. male with medical history significant for hypothyroidism, HTN, HLD, and atrial fibrillation who presents for evaluation of a monoclonal gammopathy.  ? ?On review of the previous records Mr. Darren Hinton was initially followed here at the cancer center by Dr. Earlie Server for IgA lambda MGUS.  At the time his labs were consistent with MGUS with normal hemoglobin and creatinine.  Recently on 12/11/2021 the patient was seen by nephrology.  At that time he had a hemoglobin of 8.6, creatinine of 3.47, with myeloma protein of 3.4, kappa 19.2, and a lambda of one 576.4.  With a ratio of 0.01.  Due to concern for multiple myeloma the patient was referred to hematology for further evaluation and management ? ?On exam today Mr. Darren Hinton is accompanied by his wife.  He reports that he saw the nephrologist about 2 weeks ago and was told he had worsening kidney function.  He notes he is having issues with fatigue and after walking for short distances "wants to sit down".  He notes he is seeing some bubbling in his urine but denies any pain in his bone or back.  He notes that he did recently go extensive dental work in February 2023 at which time there was some bone removal in preparation for a partial plate.  He notes there is no current procedures  planned for his teeth. ? ?On further discussion he notes that he is a former smoker having quit at the age of 49.  He does not currently drink and previously worked as a Surveyor, quantity, Clinical biochemist.  He reports that his mother died of heart attack in his father and brother had prostate cancer.  He notes he has several cousins on dialysis.  He notes he does not have any other changes in his health right now.  He denies any fevers, chills, sweats, nausea, vomiting or diarrhea.  Full 10 point ROS is listed below. ? ?MEDICAL HISTORY:  ?Past Medical History:  ?Diagnosis Date  ? Allergy   ? Anxiety   ? Arthritis   ? Depression   ? Heart murmur   ? Hyperlipidemia   ? Hyperplastic colon polyp   ? Hyperproteinemia   ? Hypertension   ? Hypothyroidism   ? Mitral valve prolapse   ? Paroxysmal atrial fibrillation (HCC)   ? Thyroid disease   ? ? ?SURGICAL HISTORY: ?No past surgical history on file. ? ?SOCIAL HISTORY: ?Social History  ? ?Socioeconomic History  ? Marital status: Married  ?  Spouse name: Not on file  ? Number of children: Not on file  ? Years of education: Not on file  ? Highest education level: Not on file  ?Occupational History  ? Not on file  ?Tobacco Use  ? Smoking status: Former  ?  Years: 20.00  ?  Types: Cigarettes  ?  Quit date: 10/01/1978  ?  Years since quitting: 43.2  ? Smokeless tobacco: Never  ?Substance and Sexual Activity  ? Alcohol use: Yes  ?  Alcohol/week: 2.0 standard drinks  ?  Types: 2 Glasses of wine per week  ?  Comment: occasionally  ? Drug use: No  ? Sexual activity: Not on file  ?Other Topics Concern  ? Not on file  ?Social History Narrative  ? Not on file  ? ?Social Determinants of Health  ? ?Financial Resource Strain: Not on file  ?Food Insecurity: Not on file  ?Transportation Needs: Not on file  ?Physical Activity: Not on file  ?Stress: Not on file  ?Social Connections: Not on file  ?Intimate Partner Violence: Not on file  ? ? ?FAMILY HISTORY: ?Family History  ?Problem Relation Age  of Onset  ? Cancer Father   ? Colon cancer Neg Hx   ? Esophageal cancer Neg Hx   ? Liver cancer Neg Hx   ? Pancreatic cancer Neg Hx   ? Rectal cancer Neg Hx   ? Stomach cancer Neg Hx   ? ? ?ALLERGIES:  is allergic to zithromax [azithromycin]. ? ?MEDICATIONS:  ?Current Outpatient Medications  ?Medication Sig Dispense Refill  ? ergocalciferol (VITAMIN D2) 1.25 MG (50000 UT) capsule TAKE ONE CAPSULE BY MOUTH EVERY 7 DAYS    ? gabapentin (NEURONTIN) 300 MG capsule TAKE ONE CAPSULE BY MOUTH TWICE A DAY FOR NERVE PAIN    ? levothyroxine (SYNTHROID) 100 MCG tablet 1 tablet in the morning on an empty stomach    ? loratadine (CLARITIN) 10 MG tablet TAKE 1 TABLET BY MOUTH DAILY AS NEEDED FOR ALLERGIES    ? polyethylene glycol powder (GLYCOLAX/MIRALAX) 17 GM/SCOOP powder take 17 grams    ? senna-docusate (SENOKOT-S) 8.6-50 MG tablet 1 tablet as needed as needed for constipation    ? vitamin B-12 (CYANOCOBALAMIN) 500 MCG tablet Take 1 tablet by mouth daily.    ? ALPRAZolam (XANAX) 0.25 MG tablet Take 0.25 mg by mouth daily as needed. Anxiety ?    ? amLODipine (NORVASC) 5 MG tablet     ? atorvastatin (LIPITOR) 20 MG tablet Take 20 mg by mouth daily.    ? chlorhexidine (PERIDEX) 0.12 % solution SMARTSIG:By Mouth    ? cromolyn (NASALCROM) 5.2 MG/ACT nasal spray Place 1 spray into the nose 3 (three) times daily as needed. Allergies ?    ? fluticasone (FLONASE) 50 MCG/ACT nasal spray Place 2 sprays into the nose daily as needed. Allergies ?    ? ipratropium (ATROVENT) 0.03 % nasal spray PLACE 1 SPRAY INTO EACH NOSTRIL UP TO 3 TIMES DAILY AS NEEDED FOR CONGESTION  5  ? meclizine (ANTIVERT) 25 MG tablet Take 1 tablet (25 mg total) by mouth 3 (three) times daily as needed for dizziness. 30 tablet 0  ? TOPROL XL 100 MG 24 hr tablet Take 100 mg by mouth daily.    ? ?Current Facility-Administered Medications  ?Medication Dose Route Frequency Provider Last Rate Last Admin  ? 0.9 %  sodium chloride infusion  500 mL Intravenous Once  Armbruster, Carlota Raspberry, MD      ? ? ?REVIEW OF SYSTEMS:   ?Constitutional: ( - ) fevers, ( - )  chills , ( - ) night sweats ?Eyes: ( - ) blurriness of vision, ( - ) double vision, ( - ) watery eyes ?Ears, nose, mouth, throat, and face: ( - ) mucositis, ( - ) sore throat ?Respiratory: ( - ) cough, ( - ) dyspnea, ( - )  wheezes ?Cardiovascular: ( - ) palpitation, ( - ) chest discomfort, ( - ) lower extremity swelling ?Gastrointestinal:  ( - ) nausea, ( - ) heartburn, ( - ) change in bowel habits ?Skin: ( - ) abnormal skin rashes ?Lymphatics: ( - ) new lymphadenopathy, ( - ) easy bruising ?Neurological: ( - ) numbness, ( - ) tingling, ( - ) new weaknesses ?Behavioral/Psych: ( - ) mood change, ( - ) new changes  ?All other systems were reviewed with the patient and are negative. ? ?PHYSICAL EXAMINATION: ? ?Vitals:  ? 12/23/21 0905  ?BP: (!) 162/81  ?Pulse: 74  ?Resp: 16  ?Temp: 98.6 ?F (37 ?C)  ?SpO2: 98%  ? ?Filed Weights  ? 12/23/21 0905  ?Weight: 215 lb 12.8 oz (97.9 kg)  ? ? ?GENERAL: well appearing elderly African-American male in NAD  ?SKIN: skin color, texture, turgor are normal, no rashes or significant lesions ?EYES: conjunctiva are pink and non-injected, sclera clear ?LUNGS: clear to auscultation and percussion with normal breathing effort ?HEART: regular rate & rhythm and no murmurs and no lower extremity edema ?Musculoskeletal: no cyanosis of digits and no clubbing  ?PSYCH: alert & oriented x 3, fluent speech ?NEURO: no focal motor/sensory deficits ? ?LABORATORY DATA:  ?I have reviewed the data as listed ? ?  Latest Ref Rng & Units 04/26/2015  ?  9:29 AM 10/19/2014  ?  9:16 AM 04/19/2014  ?  7:56 AM  ?CBC  ?WBC 4.0 - 10.3 10e3/uL 5.1   4.3   5.2    ?Hemoglobin 13.0 - 17.1 g/dL 12.1   10.5   11.4    ?Hematocrit 38.4 - 49.9 % 36.5   32.4   35.1    ?Platelets 140 - 400 10e3/uL 238   329   259    ? ? ? ?  Latest Ref Rng & Units 04/26/2015  ?  9:30 AM 10/19/2014  ?  9:16 AM 04/19/2014  ?  7:56 AM  ?CMP  ?Glucose 70 - 140  mg/dl 98   91   98    ?BUN 7.0 - 26.0 mg/dL 9.4   8.9   9.9    ?Creatinine 0.7 - 1.3 mg/dL 1.4   1.1   1.4    ?Sodium 136 - 145 mEq/L 141   142   141    ?Potassium 3.5 - 5.1 mEq/L 4.0   4.2   4.1    ?CO2 22 - 29

## 2021-12-23 ENCOUNTER — Other Ambulatory Visit: Payer: Self-pay | Admitting: Hematology and Oncology

## 2021-12-23 ENCOUNTER — Inpatient Hospital Stay: Payer: Medicare Other | Attending: Hematology and Oncology | Admitting: Hematology and Oncology

## 2021-12-23 ENCOUNTER — Inpatient Hospital Stay: Payer: Medicare Other

## 2021-12-23 ENCOUNTER — Other Ambulatory Visit: Payer: Self-pay

## 2021-12-23 VITALS — BP 162/81 | HR 74 | Temp 98.6°F | Resp 16 | Wt 215.8 lb

## 2021-12-23 DIAGNOSIS — E785 Hyperlipidemia, unspecified: Secondary | ICD-10-CM | POA: Insufficient documentation

## 2021-12-23 DIAGNOSIS — Z87891 Personal history of nicotine dependence: Secondary | ICD-10-CM | POA: Insufficient documentation

## 2021-12-23 DIAGNOSIS — I10 Essential (primary) hypertension: Secondary | ICD-10-CM | POA: Diagnosis not present

## 2021-12-23 DIAGNOSIS — R5383 Other fatigue: Secondary | ICD-10-CM | POA: Diagnosis not present

## 2021-12-23 DIAGNOSIS — D472 Monoclonal gammopathy: Secondary | ICD-10-CM

## 2021-12-23 DIAGNOSIS — Z8719 Personal history of other diseases of the digestive system: Secondary | ICD-10-CM | POA: Insufficient documentation

## 2021-12-23 DIAGNOSIS — Z809 Family history of malignant neoplasm, unspecified: Secondary | ICD-10-CM | POA: Diagnosis not present

## 2021-12-23 DIAGNOSIS — E039 Hypothyroidism, unspecified: Secondary | ICD-10-CM | POA: Insufficient documentation

## 2021-12-23 DIAGNOSIS — Z881 Allergy status to other antibiotic agents status: Secondary | ICD-10-CM | POA: Diagnosis not present

## 2021-12-23 DIAGNOSIS — C9000 Multiple myeloma not having achieved remission: Secondary | ICD-10-CM | POA: Diagnosis present

## 2021-12-23 DIAGNOSIS — Z79899 Other long term (current) drug therapy: Secondary | ICD-10-CM | POA: Insufficient documentation

## 2021-12-23 DIAGNOSIS — I480 Paroxysmal atrial fibrillation: Secondary | ICD-10-CM | POA: Diagnosis not present

## 2021-12-23 LAB — CBC WITH DIFFERENTIAL (CANCER CENTER ONLY)
Abs Immature Granulocytes: 0.02 10*3/uL (ref 0.00–0.07)
Basophils Absolute: 0 10*3/uL (ref 0.0–0.1)
Basophils Relative: 0 %
Eosinophils Absolute: 0.3 10*3/uL (ref 0.0–0.5)
Eosinophils Relative: 5 %
HCT: 25.2 % — ABNORMAL LOW (ref 39.0–52.0)
Hemoglobin: 8.1 g/dL — ABNORMAL LOW (ref 13.0–17.0)
Immature Granulocytes: 0 %
Lymphocytes Relative: 31 %
Lymphs Abs: 1.8 10*3/uL (ref 0.7–4.0)
MCH: 33.3 pg (ref 26.0–34.0)
MCHC: 32.1 g/dL (ref 30.0–36.0)
MCV: 103.7 fL — ABNORMAL HIGH (ref 80.0–100.0)
Monocytes Absolute: 0.8 10*3/uL (ref 0.1–1.0)
Monocytes Relative: 13 %
Neutro Abs: 2.9 10*3/uL (ref 1.7–7.7)
Neutrophils Relative %: 51 %
Platelet Count: 254 10*3/uL (ref 150–400)
RBC: 2.43 MIL/uL — ABNORMAL LOW (ref 4.22–5.81)
RDW: 15.6 % — ABNORMAL HIGH (ref 11.5–15.5)
WBC Count: 5.7 10*3/uL (ref 4.0–10.5)
nRBC: 0 % (ref 0.0–0.2)

## 2021-12-23 LAB — LACTATE DEHYDROGENASE: LDH: 88 U/L — ABNORMAL LOW (ref 98–192)

## 2021-12-23 LAB — CMP (CANCER CENTER ONLY)
ALT: 8 U/L (ref 0–44)
AST: 13 U/L — ABNORMAL LOW (ref 15–41)
Albumin: 3.8 g/dL (ref 3.5–5.0)
Alkaline Phosphatase: 69 U/L (ref 38–126)
Anion gap: 10 (ref 5–15)
BUN: 25 mg/dL — ABNORMAL HIGH (ref 8–23)
CO2: 23 mmol/L (ref 22–32)
Calcium: 9.5 mg/dL (ref 8.9–10.3)
Chloride: 104 mmol/L (ref 98–111)
Creatinine: 2.94 mg/dL — ABNORMAL HIGH (ref 0.61–1.24)
GFR, Estimated: 21 mL/min — ABNORMAL LOW (ref >60)
Glucose, Bld: 101 mg/dL — ABNORMAL HIGH (ref 70–99)
Potassium: 4.4 mmol/L (ref 3.5–5.1)
Sodium: 137 mmol/L (ref 135–145)
Total Bilirubin: 0.4 mg/dL (ref 0.3–1.2)
Total Protein: 10 g/dL — ABNORMAL HIGH (ref 6.5–8.1)

## 2021-12-24 ENCOUNTER — Telehealth: Payer: Self-pay | Admitting: Hematology and Oncology

## 2021-12-24 LAB — KAPPA/LAMBDA LIGHT CHAINS
Kappa free light chain: 17.7 mg/L (ref 3.3–19.4)
Kappa, lambda light chain ratio: 0.01 — ABNORMAL LOW (ref 0.26–1.65)
Lambda free light chains: 1484.5 mg/L — ABNORMAL HIGH (ref 5.7–26.3)

## 2021-12-24 LAB — BETA 2 MICROGLOBULIN, SERUM: Beta-2 Microglobulin: 8.9 mg/L — ABNORMAL HIGH (ref 0.6–2.4)

## 2021-12-24 NOTE — Telephone Encounter (Signed)
Scheduled per 4/17 los, pt has been called and confirmed  ?

## 2021-12-25 LAB — MULTIPLE MYELOMA PANEL, SERUM
Albumin SerPl Elph-Mcnc: 3.8 g/dL (ref 2.9–4.4)
Albumin/Glob SerPl: 0.7 (ref 0.7–1.7)
Alpha 1: 0.3 g/dL (ref 0.0–0.4)
Alpha2 Glob SerPl Elph-Mcnc: 0.6 g/dL (ref 0.4–1.0)
B-Globulin SerPl Elph-Mcnc: 0.9 g/dL (ref 0.7–1.3)
Gamma Glob SerPl Elph-Mcnc: 3.8 g/dL — ABNORMAL HIGH (ref 0.4–1.8)
Globulin, Total: 5.6 g/dL — ABNORMAL HIGH (ref 2.2–3.9)
IgA: 4440 mg/dL — ABNORMAL HIGH (ref 61–437)
IgG (Immunoglobin G), Serum: 636 mg/dL (ref 603–1613)
IgM (Immunoglobulin M), Srm: 11 mg/dL — ABNORMAL LOW (ref 15–143)
M Protein SerPl Elph-Mcnc: 2.9 g/dL — ABNORMAL HIGH
Total Protein ELP: 9.4 g/dL — ABNORMAL HIGH (ref 6.0–8.5)

## 2021-12-30 ENCOUNTER — Ambulatory Visit (HOSPITAL_COMMUNITY): Payer: Medicare Other

## 2021-12-30 ENCOUNTER — Ambulatory Visit
Admission: RE | Admit: 2021-12-30 | Discharge: 2021-12-30 | Disposition: A | Discharge status: Home / Self Care | Payer: Medicare Other | Source: Ambulatory Visit | Attending: Internal Medicine | Admitting: Internal Medicine

## 2021-12-30 DIAGNOSIS — I129 Hypertensive chronic kidney disease with stage 1 through stage 4 chronic kidney disease, or unspecified chronic kidney disease: Secondary | ICD-10-CM

## 2021-12-30 DIAGNOSIS — D472 Monoclonal gammopathy: Secondary | ICD-10-CM

## 2021-12-30 DIAGNOSIS — E039 Hypothyroidism, unspecified: Secondary | ICD-10-CM

## 2021-12-30 DIAGNOSIS — N179 Acute kidney failure, unspecified: Secondary | ICD-10-CM

## 2022-01-03 ENCOUNTER — Telehealth: Payer: Self-pay | Admitting: Hematology and Oncology

## 2022-01-03 NOTE — Telephone Encounter (Signed)
Per 4/28 in basket called and spoke to pt.  Pt confirmed appointments  ?

## 2022-01-10 ENCOUNTER — Other Ambulatory Visit (HOSPITAL_COMMUNITY): Payer: Self-pay | Admitting: Physician Assistant

## 2022-01-10 ENCOUNTER — Other Ambulatory Visit: Payer: Self-pay | Admitting: Student

## 2022-01-13 ENCOUNTER — Ambulatory Visit (HOSPITAL_COMMUNITY): Payer: Medicare Other

## 2022-01-13 ENCOUNTER — Ambulatory Visit (HOSPITAL_COMMUNITY): Admission: RE | Admit: 2022-01-13 | Payer: Medicare Other | Source: Ambulatory Visit

## 2022-01-14 ENCOUNTER — Other Ambulatory Visit (HOSPITAL_COMMUNITY): Payer: Self-pay | Admitting: Physician Assistant

## 2022-01-16 ENCOUNTER — Other Ambulatory Visit: Payer: Self-pay

## 2022-01-16 ENCOUNTER — Ambulatory Visit (HOSPITAL_COMMUNITY)
Admission: RE | Admit: 2022-01-16 | Discharge: 2022-01-16 | Disposition: A | Discharge status: Home / Self Care | Payer: Medicare Other | Source: Ambulatory Visit | Attending: Hematology and Oncology | Admitting: Hematology and Oncology

## 2022-01-16 ENCOUNTER — Encounter (HOSPITAL_COMMUNITY): Payer: Self-pay

## 2022-01-16 DIAGNOSIS — M1990 Unspecified osteoarthritis, unspecified site: Secondary | ICD-10-CM | POA: Diagnosis not present

## 2022-01-16 DIAGNOSIS — E039 Hypothyroidism, unspecified: Secondary | ICD-10-CM | POA: Diagnosis not present

## 2022-01-16 DIAGNOSIS — C9000 Multiple myeloma not having achieved remission: Secondary | ICD-10-CM | POA: Insufficient documentation

## 2022-01-16 DIAGNOSIS — I10 Essential (primary) hypertension: Secondary | ICD-10-CM | POA: Diagnosis not present

## 2022-01-16 DIAGNOSIS — E785 Hyperlipidemia, unspecified: Secondary | ICD-10-CM | POA: Diagnosis not present

## 2022-01-16 DIAGNOSIS — F32A Depression, unspecified: Secondary | ICD-10-CM | POA: Diagnosis not present

## 2022-01-16 DIAGNOSIS — I341 Nonrheumatic mitral (valve) prolapse: Secondary | ICD-10-CM | POA: Insufficient documentation

## 2022-01-16 DIAGNOSIS — D539 Nutritional anemia, unspecified: Secondary | ICD-10-CM | POA: Insufficient documentation

## 2022-01-16 DIAGNOSIS — F419 Anxiety disorder, unspecified: Secondary | ICD-10-CM | POA: Diagnosis not present

## 2022-01-16 DIAGNOSIS — I480 Paroxysmal atrial fibrillation: Secondary | ICD-10-CM | POA: Diagnosis not present

## 2022-01-16 DIAGNOSIS — D472 Monoclonal gammopathy: Secondary | ICD-10-CM

## 2022-01-16 LAB — CBC WITH DIFFERENTIAL/PLATELET
Abs Immature Granulocytes: 0.01 10*3/uL (ref 0.00–0.07)
Basophils Absolute: 0 10*3/uL (ref 0.0–0.1)
Basophils Relative: 0 %
Eosinophils Absolute: 0.3 10*3/uL (ref 0.0–0.5)
Eosinophils Relative: 6 %
HCT: 25.6 % — ABNORMAL LOW (ref 39.0–52.0)
Hemoglobin: 8.3 g/dL — ABNORMAL LOW (ref 13.0–17.0)
Immature Granulocytes: 0 %
Lymphocytes Relative: 37 %
Lymphs Abs: 2.2 10*3/uL (ref 0.7–4.0)
MCH: 35 pg — ABNORMAL HIGH (ref 26.0–34.0)
MCHC: 32.4 g/dL (ref 30.0–36.0)
MCV: 108 fL — ABNORMAL HIGH (ref 80.0–100.0)
Monocytes Absolute: 0.8 10*3/uL (ref 0.1–1.0)
Monocytes Relative: 13 %
Neutro Abs: 2.6 10*3/uL (ref 1.7–7.7)
Neutrophils Relative %: 44 %
Platelets: 262 10*3/uL (ref 150–400)
RBC: 2.37 MIL/uL — ABNORMAL LOW (ref 4.22–5.81)
RDW: 17.3 % — ABNORMAL HIGH (ref 11.5–15.5)
WBC: 6 10*3/uL (ref 4.0–10.5)
nRBC: 0 % (ref 0.0–0.2)

## 2022-01-16 MED ORDER — LIDOCAINE HCL (PF) 1 % IJ SOLN
INTRAMUSCULAR | Status: AC | PRN
  Administered 2022-01-16: 15 mL

## 2022-01-16 MED ORDER — FENTANYL CITRATE (PF) 100 MCG/2ML IJ SOLN
INTRAMUSCULAR | Status: AC
  Filled 2022-01-16: qty 2

## 2022-01-16 MED ORDER — FENTANYL CITRATE (PF) 100 MCG/2ML IJ SOLN
INTRAMUSCULAR | Status: AC | PRN
  Administered 2022-01-16 (×2): 50 ug via INTRAVENOUS

## 2022-01-16 MED ORDER — SODIUM CHLORIDE 0.9 % IV SOLN: INTRAVENOUS | Status: DC

## 2022-01-16 MED ORDER — MIDAZOLAM HCL 2 MG/2ML IJ SOLN
INTRAMUSCULAR | Status: AC
Start: 2022-01-16 — End: 2022-01-16
  Filled 2022-01-16: qty 4

## 2022-01-16 MED ORDER — MIDAZOLAM HCL 2 MG/2ML IJ SOLN
INTRAMUSCULAR | Status: AC | PRN
  Administered 2022-01-16 (×2): 1 mg via INTRAVENOUS

## 2022-01-16 NOTE — Discharge Instructions (Signed)
Discharge Instructions:   Please call Interventional Radiology clinic [phone] with any questions or concerns.  You may remove your dressing and shower tomorrow.    Bone Marrow Aspiration and Bone Marrow Biopsy, Adult, Care After This sheet gives you information about how to care for yourself after your procedure. Your health care provider may also give you more specific instructions. If you have problems or questions, contact your health care provider. What can I expect after the procedure? After the procedure, it is common to have: Mild pain and tenderness. Swelling. Bruising. Follow these instructions at home: Puncture site care  Follow instructions from your health care provider about how to take care of the puncture site. Make sure you: Wash your hands with soap and water before and after you change your bandage (dressing). If soap and water are not available, use hand sanitizer. Change your dressing as told by your health care provider. Check your puncture site every day for signs of infection. Check for: More redness, swelling, or pain. Fluid or blood. Warmth. Pus or a bad smell. Activity Return to your normal activities as told by your health care provider. Ask your health care provider what activities are safe for you. Do not lift anything that is heavier than 10 lb (4.5 kg), or the limit that you are told, until your health care provider says that it is safe. Do not drive for 24 hours if you were given a sedative during your procedure. General instructions  Take over-the-counter and prescription medicines only as told by your health care provider. Do not take baths, swim, or use a hot tub until your health care provider approves. Ask your health care provider if you may take showers. You may only be allowed to take sponge baths. If directed, put ice on the affected area. To do this: Put ice in a plastic bag. Place a towel between your skin and the bag. Leave the ice  on for 20 minutes, 2-3 times a day. Keep all follow-up visits as told by your health care provider. This is important. Contact a health care provider if: Your pain is not controlled with medicine. You have a fever. You have more redness, swelling, or pain around the puncture site. You have fluid or blood coming from the puncture site. Your puncture site feels warm to the touch. You have pus or a bad smell coming from the puncture site. Summary After the procedure, it is common to have mild pain, tenderness, swelling, and bruising. Follow instructions from your health care provider about how to take care of the puncture site and what activities are safe for you. Take over-the-counter and prescription medicines only as told by your health care provider. Contact a health care provider if you have any signs of infection, such as fluid or blood coming from the puncture site. This information is not intended to replace advice given to you by your health care provider. Make sure you discuss any questions you have with your health care provider. Document Revised: 01/11/2019 Document Reviewed: 01/11/2019 Elsevier Patient Education  2023 [employer]   Moderate Conscious Sedation, Adult, Care After This sheet gives you information about how to care for yourself after your procedure. Your health care provider may also give you more specific instructions. If you have problems or questions, contact your health care provider. What can I expect after the procedure? After the procedure, it is common to have: Sleepiness for several hours. Impaired judgment for several hours. Difficulty with balance. Vomiting if   you eat too soon. Follow these instructions at home: For the time period you were told by your health care provider: Rest. Do not participate in activities where you could fall or become injured. Do not drive or use machinery. Do not drink alcohol. Do not take sleeping pills or medicines that  cause drowsiness. Do not make important decisions or sign legal documents. Do not take care of children on your own. Eating and drinking  Follow the diet recommended by your health care provider. Drink enough fluid to keep your urine pale yellow. If you vomit: Drink water, juice, or soup when you can drink without vomiting. Make sure you have little or no nausea before eating solid foods. General instructions Take over-the-counter and prescription medicines only as told by your health care provider. Have a responsible adult stay with you for the time you are told. It is important to have someone help care for you until you are awake and alert. Do not smoke. Keep all follow-up visits as told by your health care provider. This is important. Contact a health care provider if: You are still sleepy or having trouble with balance after 24 hours. You feel light-headed. You keep feeling nauseous or you keep vomiting. You develop a rash. You have a fever. You have redness or swelling around the IV site. Get help right away if: You have trouble breathing. You have new-onset confusion at home. Summary After the procedure, it is common to feel sleepy, have impaired judgment, or feel nauseous if you eat too soon. Rest after you get home. Know the things you should not do after the procedure. Follow the diet recommended by your health care provider and drink enough fluid to keep your urine pale yellow. Get help right away if you have trouble breathing or new-onset confusion at home. This information is not intended to replace advice given to you by your health care provider. Make sure you discuss any questions you have with your health care provider. Document Revised: 12/23/2019 Document Reviewed: 07/21/2019 Elsevier Patient Education  2023 [employer] Inc.  

## 2022-01-16 NOTE — Procedures (Signed)
Interventional Radiology Procedure Note  Procedure: CT guided bone marrow aspiration and biopsy  Complications: None  EBL: < 10 mL  Findings: Aspirate and core biopsy performed of bone marrow in right iliac bone.  Plan: Bedrest supine x 1 hrs  Alverna Fawley T. Shaylinn Hladik, M.D Pager:  [phone]   

## 2022-01-16 NOTE — Consult Note (Signed)
? ?Chief Complaint: ?Patient was seen in consultation today for  CT guided bone marrow biopsy ? ?Referring Physician(s): ?Dorsey,John T IV ? ?Supervising Physician: Aletta Edouard ? ?Patient Status: [place] ? ?History of Present Illness: ?Darren Hinton. is a 76 y.o. male with past medical history significant for anxiety/depression, arthritis, hyperlipidemia, hypertension, hypothyroidism, mitral valve prolapse, paroxysmal atrial fibrillation and now with rising creatinine level and IgA lambda monoclonal gammopathy.  He presents today for CT-guided bone marrow biopsy for further evaluation/rule out myeloma. ? ?Past Medical History:  ?Diagnosis Date  ? Allergy   ? Anxiety   ? Arthritis   ? Depression   ? Heart murmur   ? Hyperlipidemia   ? Hyperplastic colon polyp   ? Hyperproteinemia   ? Hypertension   ? Hypothyroidism   ? Mitral valve prolapse   ? Paroxysmal atrial fibrillation (HCC)   ? Thyroid disease   ? ? ?History reviewed. No pertinent surgical history. ? ?Allergies: ?Zithromax [azithromycin] ? ?Medications: ?Prior to Admission medications   ?Medication Sig Start Date End Date Taking? Authorizing Provider  ?ALPRAZolam (XANAX) 0.25 MG tablet Take 0.25 mg by mouth daily as needed. Anxiety ?   Yes [name], MD  ?atorvastatin (LIPITOR) 20 MG tablet Take 20 mg by mouth daily.   Yes [provider]  ?chlorhexidine (PERIDEX) 0.12 % solution SMARTSIG:By Mouth 11/21/21  Yes [provider]  ?cromolyn (NASALCROM) 5.2 MG/ACT nasal spray Place 1 spray into the nose 3 (three) times daily as needed. Allergies ?   Yes [provider]  ?ergocalciferol (VITAMIN D2) 1.25 MG (50000 UT) capsule TAKE ONE CAPSULE BY MOUTH EVERY 7 DAYS 10/27/20  Yes [provider]  ?fluticasone (FLONASE) 50 MCG/ACT nasal spray Place 2 sprays into the nose daily as needed. Allergies ?   Yes [provider]  ?gabapentin (NEURONTIN) 300 MG capsule TAKE ONE CAPSULE BY MOUTH TWICE A DAY  FOR NERVE PAIN 04/15/19  Yes [provider]  ?levothyroxine (SYNTHROID) 100 MCG tablet 1 tablet in the morning on an empty stomach 10/14/21  Yes [provider]  ?loratadine (CLARITIN) 10 MG tablet TAKE 1 TABLET BY MOUTH DAILY AS NEEDED FOR ALLERGIES 01/04/20  Yes [provider]  ?meclizine (ANTIVERT) 25 MG tablet Take 1 tablet (25 mg total) by mouth 3 (three) times daily as needed for dizziness. 03/20/13  Yes Shawnee Knapp, MD  ?polyethylene glycol powder [facility]) 17 GM/SCOOP powder take 17 grams 11/26/21  Yes [provider]  ?senna-docusate (SENOKOT-S) 8.6-50 MG tablet 1 tablet as needed as needed for constipation 11/26/21  Yes [provider]  ?TOPROL XL 100 MG 24 hr tablet Take 100 mg by mouth daily. 04/12/14  Yes [provider]  ?vitamin B-12 (CYANOCOBALAMIN) 500 MCG tablet Take 1 tablet by mouth daily. 07/19/15  Yes [provider]  ?amLODipine (NORVASC) 5 MG tablet  10/24/14   [provider]  ?ipratropium (ATROVENT) 0.03 % nasal spray PLACE 1 SPRAY INTO EACH NOSTRIL UP TO 3 TIMES DAILY AS NEEDED FOR CONGESTION 04/12/15   [provider]  ?  ? ?Family History  ?Problem Relation Age of Onset  ? Cancer Father   ? Colon cancer Neg Hx   ? Esophageal cancer Neg Hx   ? Liver cancer Neg Hx   ? Pancreatic cancer Neg Hx   ? Rectal cancer Neg Hx   ? Stomach cancer Neg Hx   ? ? ?Social History  ? ?Socioeconomic History  ? Marital  status: Married  ?  Spouse name: Not on file  ? Number of children: Not on file  ? Years of education: Not on file  ? Highest education level: Not on file  ?Occupational History  ? Not on file  ?Tobacco Use  ? Smoking status: Former  ?  Years: 20.00  ?  Types: Cigarettes  ?  Quit date: 10/01/1978  ?  Years since quitting: 43.3  ? Smokeless tobacco: Never  ?Substance and Sexual Activity  ? Alcohol use: Yes  ?  Alcohol/week: 2.0 standard drinks  ?  Types: 2 Glasses of wine per week  ?  Comment: occasionally  ? Drug  use: No  ? Sexual activity: Not on file  ?Other Topics Concern  ? Not on file  ?Social History Narrative  ? Not on file  ? ?Social Determinants of Health  ? ?Financial Resource Strain: Not on file  ?Food Insecurity: Not on file  ?Transportation Needs: Not on file  ?Physical Activity: Not on file  ?Stress: Not on file  ?Social Connections: Not on file  ? ? ? ? ?Review of Systems currently denies fever, headache, chest pain, dyspnea, cough, abdominal pain, nausea, vomiting or bleeding.  He does have some back pain. ? ?Vital Signs: ?BP 138/75   Pulse (!) 59   Temp 98 ?F (36.7 ?C) (Oral)   Resp 18   SpO2 100%  ? ?Physical Exam awake, alert.  Chest clear to auscultation bilaterally.  Heart with slightly bradycardic but regular rhythm.  Abdomen soft, positive bowel sounds, nontender.  No lower extremity edema. ? ?Imaging: ?US RENAL ? ?Result Date: 12/31/2021 ?CLINICAL DATA:  Acute renal failure. Hypertension. Monoclonal gammopathy. EXAM: RENAL / URINARY TRACT ULTRASOUND COMPLETE COMPARISON:  No prior. FINDINGS: Right Kidney: Renal measurements: 9.2 x 5.4 x 5.3 cm = volume: 138.3 mL. Lobulated contour. Increased echogenicity. No mass or hydronephrosis visualized. Left Kidney: Renal measurements: 12.1 x 6.1 x 5.8 cm = volume: 223.4 mL. Lobulated contour. Increased echogenicity. No mass or hydronephrosis visualized. Bladder: Appears normal for degree of bladder distention. Other: None. IMPRESSION: Bilateral lobulated renal contour and increased renal echogenicity consistent with renal scarring and chronic medical renal disease. No acute abnormality. No hydronephrosis or bladder distention. Electronically Signed   By: Marcello Moores  Register M.D.   On: 12/31/2021 14:38   ? ?Labs: ? ?CBC: ?Recent Labs  ?  12/23/21 ?1011 01/16/22 ?0745  ?WBC 5.7 6.0  ?HGB 8.1* 8.3*  ?HCT 25.2* 25.6*  ?PLT 254 262  ? ? ?COAGS: ?No results for input(s): INR, APTT in the last 8760 hours. ? ?BMP: ?Recent Labs  ?  12/23/21 ?1011  ?NA 137  ?K 4.4  ?CL  104  ?CO2 23  ?GLUCOSE 101*  ?BUN 25*  ?CALCIUM 9.5  ?CREATININE 2.94*  ?GFRNONAA 21*  ? ? ?LIVER FUNCTION TESTS: ?Recent Labs  ?  12/23/21 ?1011  ?BILITOT 0.4  ?AST 13*  ?ALT 8  ?ALKPHOS 69  ?PROT 10.0*  ?ALBUMIN 3.8  ? ? ?TUMOR MARKERS: ?No results for input(s): AFPTM, CEA, CA199, CHROMGRNA in the last 8760 hours. ? ?Assessment and Plan: ?76 y.o. male with past medical history significant for anxiety/depression, arthritis, hyperlipidemia, hypertension, hypothyroidism, mitral valve prolapse, paroxysmal atrial fibrillation and now with rising creatinine level and IgA lambda monoclonal gammopathy.  He presents today for CT-guided bone marrow biopsy for further evaluation/rule out myeloma.Risks and benefits of procedure was discussed with the patient  including, but not limited to bleeding, infection, damage to adjacent structures or low  yield requiring additional tests. ? ?All of the questions were answered and there is agreement to proceed. ? ?Consent signed and in chart. ? ? ? ?Thank you for this interesting consult.  I greatly enjoyed meeting Brae Gartman. and look forward to participating in their care.  A copy of this report was sent to the requesting provider on this date. ? ?Electronically Signed: ?Autumn Messing, PA-C ?01/16/2022, 8:31 AM ? ? ?I spent a total of   20 minutes  in face to face in clinical consultation, greater than 50% of which was counseling/coordinating care for CT guided bone marrow biopsy ? ?

## 2022-01-17 ENCOUNTER — Inpatient Hospital Stay: Payer: No Typology Code available for payment source | Admitting: Hematology and Oncology

## 2022-01-17 ENCOUNTER — Inpatient Hospital Stay: Payer: No Typology Code available for payment source

## 2022-01-20 LAB — SURGICAL PATHOLOGY

## 2022-01-22 ENCOUNTER — Other Ambulatory Visit: Payer: Self-pay

## 2022-01-22 DIAGNOSIS — D472 Monoclonal gammopathy: Secondary | ICD-10-CM

## 2022-01-23 ENCOUNTER — Inpatient Hospital Stay: Payer: No Typology Code available for payment source | Admitting: Hematology and Oncology

## 2022-01-23 ENCOUNTER — Other Ambulatory Visit: Payer: Self-pay

## 2022-01-23 ENCOUNTER — Inpatient Hospital Stay: Payer: No Typology Code available for payment source | Attending: Hematology and Oncology

## 2022-01-23 ENCOUNTER — Other Ambulatory Visit: Payer: Self-pay | Admitting: *Deleted

## 2022-01-23 VITALS — BP 154/75 | HR 60 | Temp 98.2°F | Wt 218.8 lb

## 2022-01-23 DIAGNOSIS — C9000 Multiple myeloma not having achieved remission: Secondary | ICD-10-CM | POA: Insufficient documentation

## 2022-01-23 DIAGNOSIS — D472 Monoclonal gammopathy: Secondary | ICD-10-CM | POA: Diagnosis not present

## 2022-01-23 DIAGNOSIS — Z79899 Other long term (current) drug therapy: Secondary | ICD-10-CM | POA: Diagnosis not present

## 2022-01-23 LAB — CBC WITH DIFFERENTIAL (CANCER CENTER ONLY)
Abs Immature Granulocytes: 0.01 10*3/uL (ref 0.00–0.07)
Basophils Absolute: 0 10*3/uL (ref 0.0–0.1)
Basophils Relative: 0 %
Eosinophils Absolute: 0.4 10*3/uL (ref 0.0–0.5)
Eosinophils Relative: 7 %
HCT: 26.1 % — ABNORMAL LOW (ref 39.0–52.0)
Hemoglobin: 8.5 g/dL — ABNORMAL LOW (ref 13.0–17.0)
Immature Granulocytes: 0 %
Lymphocytes Relative: 33 %
Lymphs Abs: 1.9 10*3/uL (ref 0.7–4.0)
MCH: 34.6 pg — ABNORMAL HIGH (ref 26.0–34.0)
MCHC: 32.6 g/dL (ref 30.0–36.0)
MCV: 106.1 fL — ABNORMAL HIGH (ref 80.0–100.0)
Monocytes Absolute: 0.8 10*3/uL (ref 0.1–1.0)
Monocytes Relative: 14 %
Neutro Abs: 2.6 10*3/uL (ref 1.7–7.7)
Neutrophils Relative %: 46 %
Platelet Count: 264 10*3/uL (ref 150–400)
RBC: 2.46 MIL/uL — ABNORMAL LOW (ref 4.22–5.81)
RDW: 17.4 % — ABNORMAL HIGH (ref 11.5–15.5)
WBC Count: 5.7 10*3/uL (ref 4.0–10.5)
nRBC: 0 % (ref 0.0–0.2)

## 2022-01-23 LAB — CMP (CANCER CENTER ONLY)
ALT: 8 U/L (ref 0–44)
AST: 13 U/L — ABNORMAL LOW (ref 15–41)
Albumin: 3.7 g/dL (ref 3.5–5.0)
Alkaline Phosphatase: 68 U/L (ref 38–126)
Anion gap: 10 (ref 5–15)
BUN: 19 mg/dL (ref 8–23)
CO2: 25 mmol/L (ref 22–32)
Calcium: 9.2 mg/dL (ref 8.9–10.3)
Chloride: 104 mmol/L (ref 98–111)
Creatinine: 2.34 mg/dL — ABNORMAL HIGH (ref 0.61–1.24)
GFR, Estimated: 28 mL/min — ABNORMAL LOW (ref >60)
Glucose, Bld: 93 mg/dL (ref 70–99)
Potassium: 4.1 mmol/L (ref 3.5–5.1)
Sodium: 139 mmol/L (ref 135–145)
Total Bilirubin: 0.4 mg/dL (ref 0.3–1.2)
Total Protein: 9.8 g/dL — ABNORMAL HIGH (ref 6.5–8.1)

## 2022-01-23 LAB — LACTATE DEHYDROGENASE: LDH: 84 U/L — ABNORMAL LOW (ref 98–192)

## 2022-01-23 NOTE — Progress Notes (Signed)
[place] [phone]   [phone]  PROGRESS NOTE  Patient Care Team: [name], MD as PCP - General (Internal Medicine)  Hematological/Oncological History # IgA Lambda Multiple Myeloma 05/03/2015: last visit with Dr. Julien Nordmann at the [facility]. Was followed for IgA lambda MGUS. 12/11/2021: labs show M protein 3.4, Kappa 19.2, Lambda 1576.4, ratio 0.01. Cr 3.47, Hgb 8.6, WBC 5.7, MCV 99, Plt 221 12/23/2021: establish care with Dr. [name]  01/16/2022: Bone marrow biopsy performed, showed a 60% cellular bone marrow predominantly comprised of plasma cells making up 70 to 80%, lambda restricted.  Myeloma FISH panel showed no evidence of abnormalities.  Interval History:  Darren Hinton. 76 y.o. male with medical history significant for IgA lambda multiple myeloma who presents for a follow up visit. The patient's last visit was on 12/23/2021. In the interim since the last visit underwent a bone marrow biopsy which confirmed the diagnosis of multiple myeloma.  On exam today Darren Hinton is accompanied by his wife.  He reports that the procedure went well although he was very nervous.  He notes that he was sedated though he did not have any issues with soreness or bleeding afterwards.  He said no other changes in his health.  He reports his energy has been good and he is doing his best to continue walking.  He reports that he is not having any issues with fevers, chills, sweats, nausea, vomiting or diarrhea.  A full 10 point ROS is otherwise negative.  The bulk of our discussion focused on the diagnosis of multiple myeloma and the steps moving forward.  The details of her discussion are noted below.  MEDICAL HISTORY:  Past Medical History:  Diagnosis Date   Allergy    Anxiety    Arthritis    Depression    Heart murmur    Hyperlipidemia    Hyperplastic colon polyp    Hyperproteinemia    Hypertension    Hypothyroidism    Mitral valve prolapse     Paroxysmal atrial fibrillation (HCC)    Thyroid disease     SURGICAL HISTORY: No past surgical history on file.  SOCIAL HISTORY: Social History   Socioeconomic History   Marital status: Married    Spouse name: Not on file   Number of children: Not on file   Years of education: Not on file   Highest education level: Not on file  Occupational History   Not on file  Tobacco Use   Smoking status: Former    Years: 20.00    Types: Cigarettes    Quit date: 10/01/1978    Years since quitting: 43.3   Smokeless tobacco: Never  Substance and Sexual Activity   Alcohol use: Yes    Alcohol/week: 2.0 standard drinks    Types: 2 Glasses of wine per week    Comment: occasionally   Drug use: No   Sexual activity: Not on file  Other Topics Concern   Not on file  Social History Narrative   Not on file   Social Determinants of Health   Financial Resource Strain: Not on file  Food Insecurity: Not on file  Transportation Needs: Not on file  Physical Activity: Not on file  Stress: Not on file  Social Connections: Not on file  Intimate Partner Violence: Not on file    FAMILY HISTORY: Family History  Problem Relation Age of Onset   Cancer Father    Colon cancer Neg Hx  Esophageal cancer Neg Hx    Liver cancer Neg Hx    Pancreatic cancer Neg Hx    Rectal cancer Neg Hx    Stomach cancer Neg Hx     ALLERGIES:  is allergic to zithromax [azithromycin].  MEDICATIONS:  Current Outpatient Medications  Medication Sig Dispense Refill   acyclovir (ZOVIRAX) 400 MG tablet Take 1 tablet (400 mg total) by mouth 2 (two) times daily. 180 tablet 1   allopurinol (ZYLOPRIM) 300 MG tablet Take 1 tablet (300 mg total) by mouth daily. 90 tablet 1   dexamethasone (DECADRON) 4 MG tablet Take 10 tablets (40 mg total) by mouth once a week. Take steroids pills the in the morning on chemotherapy days. 120 tablet 1   ondansetron (ZOFRAN) 8 MG tablet Take 1 tablet (8 mg total) by mouth every 8 (eight)  hours as needed. 30 tablet 0   prochlorperazine (COMPAZINE) 10 MG tablet Take 1 tablet (10 mg total) by mouth every 6 (six) hours as needed for nausea or vomiting. 30 tablet 0   ALPRAZolam (XANAX) 0.25 MG tablet Take 0.25 mg by mouth daily as needed. Anxiety      amLODipine (NORVASC) 5 MG tablet      atorvastatin (LIPITOR) 20 MG tablet Take 20 mg by mouth daily.     chlorhexidine (PERIDEX) 0.12 % solution SMARTSIG:By Mouth     cromolyn (NASALCROM) 5.2 MG/ACT nasal spray Place 1 spray into the nose 3 (three) times daily as needed. Allergies      ergocalciferol (VITAMIN D2) 1.25 MG (50000 UT) capsule TAKE ONE CAPSULE BY MOUTH EVERY 7 DAYS     fluticasone (FLONASE) 50 MCG/ACT nasal spray Place 2 sprays into the nose daily as needed. Allergies      gabapentin (NEURONTIN) 300 MG capsule TAKE ONE CAPSULE BY MOUTH TWICE A DAY FOR NERVE PAIN     ipratropium (ATROVENT) 0.03 % nasal spray PLACE 1 SPRAY INTO EACH NOSTRIL UP TO 3 TIMES DAILY AS NEEDED FOR CONGESTION  5   levothyroxine (SYNTHROID) 100 MCG tablet 1 tablet in the morning on an empty stomach     loratadine (CLARITIN) 10 MG tablet TAKE 1 TABLET BY MOUTH DAILY AS NEEDED FOR ALLERGIES     meclizine (ANTIVERT) 25 MG tablet Take 1 tablet (25 mg total) by mouth 3 (three) times daily as needed for dizziness. 30 tablet 0   polyethylene glycol powder (GLYCOLAX/MIRALAX) 17 GM/SCOOP powder take 17 grams     senna-docusate (SENOKOT-S) 8.6-50 MG tablet 1 tablet as needed as needed for constipation     TOPROL XL 100 MG 24 hr tablet Take 100 mg by mouth daily.     vitamin B-12 (CYANOCOBALAMIN) 500 MCG tablet Take 1 tablet by mouth daily.     Current Facility-Administered Medications  Medication Dose Route Frequency Provider Last Rate Last Admin   0.9 %  sodium chloride infusion  500 mL Intravenous Once Armbruster, Steven P, MD        REVIEW OF SYSTEMS:   Constitutional: ( - ) fevers, ( - )  chills , ( - ) night sweats Eyes: ( - ) blurriness of vision,  ( - ) double vision, ( - ) watery eyes Ears, nose, mouth, throat, and face: ( - ) mucositis, ( - ) sore throat Respiratory: ( - ) cough, ( - ) dyspnea, ( - ) wheezes Cardiovascular: ( - ) palpitation, ( - ) chest discomfort, ( - ) lower extremity swelling Gastrointestinal:  ( - ) nausea, ( - )   heartburn, ( - ) change in bowel habits Skin: ( - ) abnormal skin rashes Lymphatics: ( - ) new lymphadenopathy, ( - ) easy bruising Neurological: ( - ) numbness, ( - ) tingling, ( - ) new weaknesses Behavioral/Psych: ( - ) mood change, ( - ) new changes  All other systems were reviewed with the patient and are negative.  PHYSICAL EXAMINATION: ECOG PERFORMANCE STATUS: 1 - Symptomatic but completely ambulatory  Vitals:   01/23/22 0857  BP: (!) 154/75  Pulse: 60  Temp: 98.2 F (36.8 C)  SpO2: 99%   Filed Weights   01/23/22 0857  Weight: 218 lb 12.8 oz (99.2 kg)    GENERAL: Chronically ill-appearing elderly African-American male, alert, no distress and comfortable SKIN: skin color, texture, turgor are normal, no rashes or significant lesions EYES: conjunctiva are pink and non-injected, sclera clear LUNGS: clear to auscultation and percussion with normal breathing effort HEART: regular rate & rhythm and no murmurs and no lower extremity edema ABDOMEN: soft, non-tender, non-distended, normal bowel sounds Musculoskeletal: no cyanosis of digits and no clubbing  PSYCH: alert & oriented x 3, fluent speech NEURO: no focal motor/sensory deficits  LABORATORY DATA:  I have reviewed the data as listed    Latest Ref Rng & Units 01/23/2022    8:41 AM 01/16/2022    7:45 AM 12/23/2021   10:11 AM  CBC  WBC 4.0 - 10.5 K/uL 5.7   6.0   5.7    Hemoglobin 13.0 - 17.0 g/dL 8.5   8.3   8.1    Hematocrit 39.0 - 52.0 % 26.1   25.6   25.2    Platelets 150 - 400 K/uL 264   262   254         Latest Ref Rng & Units 01/23/2022    8:41 AM 12/23/2021   10:11 AM 04/26/2015    9:30 AM  CMP  Glucose 70 - 99 mg/dL  93   101   98    BUN 8 - 23 mg/dL 19   25   9.4    Creatinine 0.61 - 1.24 mg/dL 2.34   2.94   1.4    Sodium 135 - 145 mmol/L 139   137   141    Potassium 3.5 - 5.1 mmol/L 4.1   4.4   4.0    Chloride 98 - 111 mmol/L 104   104     CO2 22 - 32 mmol/L 25   23   21    Calcium 8.9 - 10.3 mg/dL 9.2   9.5   9.0    Total Protein 6.5 - 8.1 g/dL 9.8   10.0   7.6    Total Bilirubin 0.3 - 1.2 mg/dL 0.4   0.4   0.43    Alkaline Phos 38 - 126 U/L 68   69   87    AST 15 - 41 U/L 13   13   19    ALT 0 - 44 U/L 8   8   12      Lab Results  Component Value Date   MPROTEIN 3.1 (H) 01/23/2022   MPROTEIN 2.9 (H) 12/23/2021   Lab Results  Component Value Date   KPAFRELGTCHN 17.0 01/23/2022   KPAFRELGTCHN 17.7 12/23/2021   KPAFRELGTCHN 1.85 04/26/2015   LAMBDASER 1,228.3 (H) 01/23/2022   LAMBDASER 1,484.5 (H) 12/23/2021   LAMBDASER 3.85 (H) 04/26/2015   KAPLAMBRATIO 0.01 (L) 01/23/2022   KAPLAMBRATIO 0.01 (L) 01/23/2022     KAPLAMBRATIO 0.01 (L) 12/23/2021    RADIOGRAPHIC STUDIES: CT BIOPSY  Result Date: 01/16/2022 CLINICAL DATA:  History of monoclonal gammopathy and concern for potential progression to multiple myeloma. Bone marrow biopsy requested for further evaluation. EXAM: CT GUIDED BONE MARROW ASPIRATION AND BIOPSY ANESTHESIA/SEDATION: Moderate (conscious) sedation was employed during this procedure. A total of Versed 2.0 mg and Fentanyl 100 mcg was administered intravenously by radiology nursing. Moderate Sedation Time: 10 minutes. The patient's level of consciousness and vital signs were monitored continuously by radiology nursing throughout the procedure under my direct supervision. PROCEDURE: The procedure risks, benefits, and alternatives were explained to the patient. Questions regarding the procedure were encouraged and answered. The patient understands and consents to the procedure. A time out was performed prior to initiating the procedure. The right gluteal region was prepped with  chlorhexidine. Sterile gown and sterile gloves were used for the procedure. Local anesthesia was provided with 1% Lidocaine. Under CT guidance, an 11 gauge On Control bone cutting needle was advanced from a posterior approach into the right iliac bone. Needle positioning was confirmed with CT. Initial non heparinized and heparinized aspirate samples were obtained of bone marrow. Core biopsy was performed via the On Control drill needle. COMPLICATIONS: None FINDINGS: Inspection of initial aspirate did reveal visible particles. Intact core biopsy sample was obtained. IMPRESSION: CT guided bone marrow biopsy of right posterior iliac bone with both aspirate and core samples obtained. Electronically Signed   By: Glenn  Yamagata M.D.   On: 01/16/2022 11:39   CT BONE MARROW BIOPSY  Result Date: 01/16/2022 CLINICAL DATA:  History of monoclonal gammopathy and concern for potential progression to multiple myeloma. Bone marrow biopsy requested for further evaluation. EXAM: CT GUIDED BONE MARROW ASPIRATION AND BIOPSY ANESTHESIA/SEDATION: Moderate (conscious) sedation was employed during this procedure. A total of Versed 2.0 mg and Fentanyl 100 mcg was administered intravenously by radiology nursing. Moderate Sedation Time: 10 minutes. The patient's level of consciousness and vital signs were monitored continuously by radiology nursing throughout the procedure under my direct supervision. PROCEDURE: The procedure risks, benefits, and alternatives were explained to the patient. Questions regarding the procedure were encouraged and answered. The patient understands and consents to the procedure. A time out was performed prior to initiating the procedure. The right gluteal region was prepped with chlorhexidine. Sterile gown and sterile gloves were used for the procedure. Local anesthesia was provided with 1% Lidocaine. Under CT guidance, an 11 gauge On Control bone cutting needle was advanced from a posterior approach into the  right iliac bone. Needle positioning was confirmed with CT. Initial non heparinized and heparinized aspirate samples were obtained of bone marrow. Core biopsy was performed via the On Control drill needle. COMPLICATIONS: None FINDINGS: Inspection of initial aspirate did reveal visible particles. Intact core biopsy sample was obtained. IMPRESSION: CT guided bone marrow biopsy of right posterior iliac bone with both aspirate and core samples obtained. Electronically Signed   By: Glenn  Yamagata M.D.   On: 01/16/2022 11:39    ASSESSMENT & PLAN Darren W Hulsey Jr. 75 y.o. male with medical history significant for IgA lambda multiple myeloma who presents for a follow up visit.   Today we discussed the disease process of multiple myeloma.  We discussed the incurable nature of this disease and the nature of the treatment.  We discussed that this is a cancer of the bone marrow with plasma cells producing an abundance of M protein which in this patient's case causes his severe renal dysfunction.    Additionally we noted that myeloma can cause lytic lesions of the bone, anemia, and increased levels of calcium.  We discussed the treatment moving forward including doublet and triplet therapy.  Given the patient's good health overall, but poor kidney function I would recommend proceeding with CyBorD chemotherapy.  Darren Hinton voices understanding of the treatment regimen and the plan moving forward.  At this time we will plan to proceed with Velcade 1.5 mg per metered squared subq weekly, cyclophosphamide 300 mg per metered squared IV weekly, and dexamethasone 20 mg p.o. weekly.  Cycles will consist of 4 weekly treatments and will continue for a minimum of 6 cycles before consideration of maintenance therapy.  Additionally if kidney function improves can consider transitioning regimen to VRD.    # IgA lambda Multiple Myeloma -- Bone marrow biopsy performed on 01/16/2022 showed increased plasma cells consistent with a  plasma cell neoplasm.  Normal FISH panel. --Patient meets diagnostic criteria based on kidney dysfunction and anemia. --Plan to proceed with CyBorD chemotherapy at next visit  #Supportive Care -- chemotherapy education to be scheduled  -- port placement not required.  -- zofran 44m q8H PRN and compazine 140mPO q6H for nausea -- acyclovir 40035mO BID for VCZ prophylaxis -- allopurinol 300m34m daily for TLS prophylaxis --zometa to start after dental clearance. Will discuss once treatment is underway. -- no pain medication required at this time.    No orders of the defined types were placed in this encounter.   All questions were answered. The patient knows to call the clinic with any problems, questions or concerns.  A total of more than 30 minutes were spent on this encounter with face-to-face time and non-face-to-face time, including preparing to see the patient, ordering tests and/or medications, counseling the patient and coordination of care as outlined above.   JohnLedell Peoples Department of Hematology/Oncology [facility]: [phone]: [phone]: [email or web address]  02/02/2022 5:14 PM

## 2022-01-24 LAB — KAPPA/LAMBDA LIGHT CHAINS
Kappa free light chain: 17 mg/L (ref 3.3–19.4)
Kappa, lambda light chain ratio: 0.01 — ABNORMAL LOW (ref 0.26–1.65)
Lambda free light chains: 1228.3 mg/L — ABNORMAL HIGH (ref 5.7–26.3)

## 2022-01-24 LAB — BETA 2 MICROGLOBULIN, SERUM: Beta-2 Microglobulin: 8 mg/L — ABNORMAL HIGH (ref 0.6–2.4)

## 2022-01-27 ENCOUNTER — Encounter (HOSPITAL_COMMUNITY): Payer: Self-pay | Admitting: Hematology and Oncology

## 2022-01-27 LAB — UPEP/UIFE/LIGHT CHAINS/TP, 24-HR UR
% BETA, Urine: 5.9 %
ALPHA 1 URINE: 0.9 %
Albumin, U: 9.4 %
Alpha 2, Urine: 2.3 %
Free Kappa Lt Chains,Ur: 98.03 mg/L — ABNORMAL HIGH (ref 1.17–86.46)
Free Kappa/Lambda Ratio: 0.01 — ABNORMAL LOW (ref 1.83–14.26)
Free Lambda Lt Chains,Ur: 9976.49 mg/L — ABNORMAL HIGH (ref 0.27–15.21)
GAMMA GLOBULIN URINE: 81.4 %
M-SPIKE %, Urine: 71 % — ABNORMAL HIGH
M-Spike, Mg/24 Hr: 2235 mg/24 hr — ABNORMAL HIGH
Total Protein, Urine-Ur/day: 3148 mg/24 hr — ABNORMAL HIGH (ref 30–150)
Total Protein, Urine: 262.3 mg/dL
Total Volume: 1200

## 2022-01-28 ENCOUNTER — Encounter (HOSPITAL_COMMUNITY): Payer: Self-pay | Admitting: Hematology and Oncology

## 2022-01-28 LAB — MULTIPLE MYELOMA PANEL, SERUM
Albumin SerPl Elph-Mcnc: 3.9 g/dL (ref 2.9–4.4)
Albumin/Glob SerPl: 0.8 (ref 0.7–1.7)
Alpha 1: 0.2 g/dL (ref 0.0–0.4)
Alpha2 Glob SerPl Elph-Mcnc: 0.6 g/dL (ref 0.4–1.0)
B-Globulin SerPl Elph-Mcnc: 4.3 g/dL — ABNORMAL HIGH (ref 0.7–1.3)
Gamma Glob SerPl Elph-Mcnc: 0.4 g/dL (ref 0.4–1.8)
Globulin, Total: 5.5 g/dL — ABNORMAL HIGH (ref 2.2–3.9)
IgA: 3940 mg/dL — ABNORMAL HIGH (ref 61–437)
IgG (Immunoglobin G), Serum: 550 mg/dL — ABNORMAL LOW (ref 603–1613)
IgM (Immunoglobulin M), Srm: 11 mg/dL — ABNORMAL LOW (ref 15–143)
M Protein SerPl Elph-Mcnc: 3.1 g/dL — ABNORMAL HIGH
Total Protein ELP: 9.4 g/dL — ABNORMAL HIGH (ref 6.0–8.5)

## 2022-01-30 ENCOUNTER — Encounter: Payer: Self-pay | Admitting: Hematology and Oncology

## 2022-01-30 DIAGNOSIS — C9000 Multiple myeloma not having achieved remission: Secondary | ICD-10-CM | POA: Insufficient documentation

## 2022-01-30 MED ORDER — DEXAMETHASONE 4 MG PO TABS: 40.0000 mg | ORAL_TABLET | ORAL | 1 refills | Status: DC

## 2022-01-30 MED ORDER — ONDANSETRON HCL 8 MG PO TABS: 8.0000 mg | ORAL_TABLET | Freq: Three times a day (TID) | ORAL | 0 refills | Status: DC | PRN

## 2022-01-30 MED ORDER — ALLOPURINOL 300 MG PO TABS: 300.0000 mg | ORAL_TABLET | Freq: Every day | ORAL | 1 refills | Status: DC

## 2022-01-30 MED ORDER — PROCHLORPERAZINE MALEATE 10 MG PO TABS: 10.0000 mg | ORAL_TABLET | Freq: Four times a day (QID) | ORAL | 0 refills | Status: DC | PRN

## 2022-01-30 MED ORDER — ACYCLOVIR 400 MG PO TABS: 400.0000 mg | ORAL_TABLET | Freq: Two times a day (BID) | ORAL | 1 refills | Status: DC

## 2022-01-30 NOTE — Progress Notes (Signed)
START ON PATHWAY REGIMEN - Multiple Myeloma and Other Plasma Cell Dyscrasias ° ° °  A cycle is every 28 days: °    Dexamethasone  °    Bortezomib  °    Cyclophosphamide  ° °**Always confirm dose/schedule in your pharmacy ordering system** ° °Patient Characteristics: °Multiple Myeloma, Newly Diagnosed, Transplant Ineligible or Refused, Unknown or Awaiting Test Results °Disease Classification: Multiple Myeloma °R-ISS Staging: II °Therapeutic Status: Newly Diagnosed °Is Patient Eligible for Transplant<= Transplant Ineligible or Refused °Risk Status: Awaiting Test Results °Intent of Therapy: °Curative Intent, Discussed with Patient °

## 2022-01-31 ENCOUNTER — Telehealth: Payer: Self-pay | Admitting: Hematology and Oncology

## 2022-01-31 NOTE — Telephone Encounter (Signed)
.  Called patient to schedule appointment per 5/25 inbasket, left pt msg 

## 2022-01-31 NOTE — Telephone Encounter (Signed)
Called pt because he wanted to move his appointments to [facility] with the Dr and was able to move appointments to Mondays except for the first treatment.  Pt confirmed the appointments

## 2022-02-02 ENCOUNTER — Encounter: Payer: Self-pay | Admitting: Hematology and Oncology

## 2022-02-04 ENCOUNTER — Inpatient Hospital Stay: Payer: No Typology Code available for payment source

## 2022-02-04 ENCOUNTER — Other Ambulatory Visit: Payer: Self-pay

## 2022-02-06 ENCOUNTER — Encounter: Payer: Self-pay | Admitting: Hematology and Oncology

## 2022-02-06 ENCOUNTER — Telehealth: Payer: Self-pay | Admitting: *Deleted

## 2022-02-06 NOTE — Progress Notes (Signed)
Called pt to introduce myself as his Arboriculturist and to discuss copay assistance.  Pt would like to apply so I completed the online application w/ the [employer] for Velcade.  Pt was approved for $12,000 from 01/07/22 through 01/07/23.  Pt is also overqualified for the [employer].  I will give him my card on 02/07/22 for any questions or concerns he may have in the future

## 2022-02-06 NOTE — Telephone Encounter (Signed)
Received vm message from pt's wife requesting pt's medical records be fax'd to the [place], Dr. Jonnie Kind. This is to assist in his financial coverage for the treatment of his multiple myeloma.  TCT pt's wife and spoke with her. Advised that his records have been fax'd to the #  she has provided to the [place] She voiced her appreciation. She is aware of his appts here tomorrow,

## 2022-02-07 ENCOUNTER — Other Ambulatory Visit: Payer: Self-pay

## 2022-02-07 ENCOUNTER — Inpatient Hospital Stay
Payer: No Typology Code available for payment source | Attending: Hematology and Oncology | Admitting: Hematology and Oncology

## 2022-02-07 ENCOUNTER — Other Ambulatory Visit: Payer: Self-pay | Admitting: Hematology and Oncology

## 2022-02-07 ENCOUNTER — Inpatient Hospital Stay: Payer: No Typology Code available for payment source

## 2022-02-07 ENCOUNTER — Encounter: Payer: Self-pay | Admitting: Hematology and Oncology

## 2022-02-07 VITALS — BP 161/90 | HR 58 | Temp 98.0°F | Resp 16 | Wt 218.7 lb

## 2022-02-07 VITALS — BP 158/76 | HR 57

## 2022-02-07 DIAGNOSIS — Z809 Family history of malignant neoplasm, unspecified: Secondary | ICD-10-CM | POA: Diagnosis not present

## 2022-02-07 DIAGNOSIS — N184 Chronic kidney disease, stage 4 (severe): Secondary | ICD-10-CM

## 2022-02-07 DIAGNOSIS — C9000 Multiple myeloma not having achieved remission: Secondary | ICD-10-CM | POA: Diagnosis present

## 2022-02-07 DIAGNOSIS — Z9221 Personal history of antineoplastic chemotherapy: Secondary | ICD-10-CM | POA: Diagnosis not present

## 2022-02-07 DIAGNOSIS — R110 Nausea: Secondary | ICD-10-CM | POA: Diagnosis not present

## 2022-02-07 DIAGNOSIS — Z7289 Other problems related to lifestyle: Secondary | ICD-10-CM | POA: Diagnosis not present

## 2022-02-07 DIAGNOSIS — Z79899 Other long term (current) drug therapy: Secondary | ICD-10-CM | POA: Diagnosis not present

## 2022-02-07 DIAGNOSIS — D631 Anemia in chronic kidney disease: Secondary | ICD-10-CM | POA: Diagnosis not present

## 2022-02-07 DIAGNOSIS — R197 Diarrhea, unspecified: Secondary | ICD-10-CM | POA: Diagnosis not present

## 2022-02-07 DIAGNOSIS — Z8719 Personal history of other diseases of the digestive system: Secondary | ICD-10-CM | POA: Insufficient documentation

## 2022-02-07 DIAGNOSIS — D649 Anemia, unspecified: Secondary | ICD-10-CM | POA: Diagnosis not present

## 2022-02-07 DIAGNOSIS — Z87891 Personal history of nicotine dependence: Secondary | ICD-10-CM | POA: Insufficient documentation

## 2022-02-07 DIAGNOSIS — Z5111 Encounter for antineoplastic chemotherapy: Secondary | ICD-10-CM | POA: Insufficient documentation

## 2022-02-07 DIAGNOSIS — K5900 Constipation, unspecified: Secondary | ICD-10-CM | POA: Diagnosis not present

## 2022-02-07 DIAGNOSIS — Z5112 Encounter for antineoplastic immunotherapy: Secondary | ICD-10-CM | POA: Diagnosis present

## 2022-02-07 DIAGNOSIS — Z881 Allergy status to other antibiotic agents status: Secondary | ICD-10-CM | POA: Insufficient documentation

## 2022-02-07 LAB — LACTATE DEHYDROGENASE: LDH: 83 U/L — ABNORMAL LOW (ref 98–192)

## 2022-02-07 LAB — CMP (CANCER CENTER ONLY)
ALT: 6 U/L (ref 0–44)
AST: 12 U/L — ABNORMAL LOW (ref 15–41)
Albumin: 3.8 g/dL (ref 3.5–5.0)
Alkaline Phosphatase: 68 U/L (ref 38–126)
Anion gap: 9 (ref 5–15)
BUN: 19 mg/dL (ref 8–23)
CO2: 24 mmol/L (ref 22–32)
Calcium: 9.5 mg/dL (ref 8.9–10.3)
Chloride: 106 mmol/L (ref 98–111)
Creatinine: 2.31 mg/dL — ABNORMAL HIGH (ref 0.61–1.24)
GFR, Estimated: 29 mL/min — ABNORMAL LOW (ref >60)
Glucose, Bld: 105 mg/dL — ABNORMAL HIGH (ref 70–99)
Potassium: 4.2 mmol/L (ref 3.5–5.1)
Sodium: 139 mmol/L (ref 135–145)
Total Bilirubin: 0.3 mg/dL (ref 0.3–1.2)
Total Protein: 9.6 g/dL — ABNORMAL HIGH (ref 6.5–8.1)

## 2022-02-07 LAB — CBC WITH DIFFERENTIAL (CANCER CENTER ONLY)
Abs Immature Granulocytes: 0.01 10*3/uL (ref 0.00–0.07)
Basophils Absolute: 0 10*3/uL (ref 0.0–0.1)
Basophils Relative: 0 %
Eosinophils Absolute: 0.3 10*3/uL (ref 0.0–0.5)
Eosinophils Relative: 5 %
HCT: 27.9 % — ABNORMAL LOW (ref 39.0–52.0)
Hemoglobin: 8.9 g/dL — ABNORMAL LOW (ref 13.0–17.0)
Immature Granulocytes: 0 %
Lymphocytes Relative: 42 %
Lymphs Abs: 2.3 10*3/uL (ref 0.7–4.0)
MCH: 34.6 pg — ABNORMAL HIGH (ref 26.0–34.0)
MCHC: 31.9 g/dL (ref 30.0–36.0)
MCV: 108.6 fL — ABNORMAL HIGH (ref 80.0–100.0)
Monocytes Absolute: 0.7 10*3/uL (ref 0.1–1.0)
Monocytes Relative: 13 %
Neutro Abs: 2.2 10*3/uL (ref 1.7–7.7)
Neutrophils Relative %: 40 %
Platelet Count: 251 10*3/uL (ref 150–400)
RBC: 2.57 MIL/uL — ABNORMAL LOW (ref 4.22–5.81)
RDW: 16.4 % — ABNORMAL HIGH (ref 11.5–15.5)
WBC Count: 5.5 10*3/uL (ref 4.0–10.5)
nRBC: 0 % (ref 0.0–0.2)

## 2022-02-07 MED ORDER — PALONOSETRON HCL INJECTION 0.25 MG/5ML
0.2500 mg | Freq: Once | INTRAVENOUS | Status: AC
  Administered 2022-02-07: 0.25 mg via INTRAVENOUS
  Filled 2022-02-07: qty 5

## 2022-02-07 MED ORDER — SODIUM CHLORIDE 0.9 % IV SOLN: Freq: Once | INTRAVENOUS | Status: AC

## 2022-02-07 MED ORDER — SODIUM CHLORIDE 0.9 % IV SOLN
300.0000 mg/m2 | Freq: Once | INTRAVENOUS | Status: AC
  Administered 2022-02-07: 700 mg via INTRAVENOUS
  Filled 2022-02-07: qty 35

## 2022-02-07 MED ORDER — BORTEZOMIB CHEMO SQ INJECTION 3.5 MG (2.5MG/ML)
1.5000 mg/m2 | Freq: Once | INTRAMUSCULAR | Status: AC
  Administered 2022-02-07: 3.5 mg via SUBCUTANEOUS
  Filled 2022-02-07: qty 1.4

## 2022-02-07 NOTE — Progress Notes (Signed)
Per Dr. [name], ok for treated today with elevated serum creatine 2.'31mg'$ /dL Per pt. he took Dexamethozone 40 mg po this morning.

## 2022-02-07 NOTE — Progress Notes (Signed)
[place] [phone]   [phone]  PROGRESS NOTE  Patient Care Team: [name], MD as PCP - General (Internal Medicine)  Hematological/Oncological History # IgA Lambda Multiple Myeloma 05/03/2015: last visit with Dr. Julien Nordmann at the [facility]. Was followed for IgA lambda MGUS. 12/11/2021: labs show M protein 3.4, Kappa 19.2, Lambda 1576.4, ratio 0.01. Cr 3.47, Hgb 8.6, WBC 5.7, MCV 99, Plt 221 12/23/2021: establish care with Dr. [name]  01/16/2022: Bone marrow biopsy performed, showed a 60% cellular bone marrow predominantly comprised of plasma cells making up 70 to 80%, lambda restricted.  Myeloma FISH panel showed no evidence of abnormalities. 02/07/2022: Cycle 1 Day 1 of CyBorD chemotherapy.   Interval History:  Darren Hinton. 76 y.o. male with medical history significant for IgA lambda multiple myeloma who presents for a follow up visit. The patient's last visit was on 01/30/2022. In the interim since the last visit he has completed chemotherapy education and has all medications on hand ready to go to start treatment.  On exam today Darren Hinton is accompanied by his wife.  He reports chemo education was quite informative and he has everything he needs to get started.  He has his medications for nausea as well as his acyclovir and allopurinol.  Additionally he reports he received a steroid pills and began taking them this morning.  His appetite remains good and he has been drinking plenty of water.  He reports that he is not having any issues with fevers, chills, sweats, nausea, vomiting or diarrhea.  A full 10 point ROS is otherwise negative.  The bulk of our discussion focused on assuring he had everything required prior to start of treatment.  Additionally we answered any last-minute questions he had prior to the start of chemotherapy.  MEDICAL HISTORY:  Past Medical History:  Diagnosis Date   Allergy    Anxiety    Arthritis     Depression    Heart murmur    Hyperlipidemia    Hyperplastic colon polyp    Hyperproteinemia    Hypertension    Hypothyroidism    Mitral valve prolapse    Paroxysmal atrial fibrillation (HCC)    Thyroid disease     SURGICAL HISTORY: No past surgical history on file.  SOCIAL HISTORY: Social History   Socioeconomic History   Marital status: Married    Spouse name: Not on file   Number of children: Not on file   Years of education: Not on file   Highest education level: Not on file  Occupational History   Not on file  Tobacco Use   Smoking status: Former    Years: 20.00    Types: Cigarettes    Quit date: 10/01/1978    Years since quitting: 43.3   Smokeless tobacco: Never  Substance and Sexual Activity   Alcohol use: Yes    Alcohol/week: 2.0 standard drinks    Types: 2 Glasses of wine per week    Comment: occasionally   Drug use: No   Sexual activity: Not on file  Other Topics Concern   Not on file  Social History Narrative   Not on file   Social Determinants of Health   Financial Resource Strain: Not on file  Food Insecurity: Not on file  Transportation Needs: Not on file  Physical Activity: Not on file  Stress: Not on file  Social Connections: Not on file  Intimate Partner Violence: Not on file    FAMILY HISTORY: Family  History  Problem Relation Age of Onset   Cancer Father    Colon cancer Neg Hx    Esophageal cancer Neg Hx    Liver cancer Neg Hx    Pancreatic cancer Neg Hx    Rectal cancer Neg Hx    Stomach cancer Neg Hx     ALLERGIES:  is allergic to zithromax [azithromycin].  MEDICATIONS:  Current Outpatient Medications  Medication Sig Dispense Refill   acyclovir (ZOVIRAX) 400 MG tablet Take 1 tablet (400 mg total) by mouth 2 (two) times daily. 180 tablet 1   allopurinol (ZYLOPRIM) 300 MG tablet Take 1 tablet (300 mg total) by mouth daily. 90 tablet 1   ALPRAZolam (XANAX) 0.25 MG tablet Take 0.25 mg by mouth daily as needed. Anxiety       amLODipine (NORVASC) 5 MG tablet      atorvastatin (LIPITOR) 20 MG tablet Take 20 mg by mouth daily.     chlorhexidine (PERIDEX) 0.12 % solution SMARTSIG:By Mouth     cromolyn (NASALCROM) 5.2 MG/ACT nasal spray Place 1 spray into the nose 3 (three) times daily as needed. Allergies      dexamethasone (DECADRON) 4 MG tablet Take 10 tablets (40 mg total) by mouth once a week. Take steroids pills the in the morning on chemotherapy days. 120 tablet 1   ergocalciferol (VITAMIN D2) 1.25 MG (50000 UT) capsule TAKE ONE CAPSULE BY MOUTH EVERY 7 DAYS     fluticasone (FLONASE) 50 MCG/ACT nasal spray Place 2 sprays into the nose daily as needed. Allergies      gabapentin (NEURONTIN) 300 MG capsule TAKE ONE CAPSULE BY MOUTH TWICE A DAY FOR NERVE PAIN     ipratropium (ATROVENT) 0.03 % nasal spray PLACE 1 SPRAY INTO EACH NOSTRIL UP TO 3 TIMES DAILY AS NEEDED FOR CONGESTION  5   levothyroxine (SYNTHROID) 100 MCG tablet 1 tablet in the morning on an empty stomach     loratadine (CLARITIN) 10 MG tablet TAKE 1 TABLET BY MOUTH DAILY AS NEEDED FOR ALLERGIES     meclizine (ANTIVERT) 25 MG tablet Take 1 tablet (25 mg total) by mouth 3 (three) times daily as needed for dizziness. 30 tablet 0   ondansetron (ZOFRAN) 8 MG tablet Take 1 tablet (8 mg total) by mouth every 8 (eight) hours as needed. 30 tablet 0   polyethylene glycol powder (GLYCOLAX/MIRALAX) 17 GM/SCOOP powder take 17 grams     prochlorperazine (COMPAZINE) 10 MG tablet Take 1 tablet (10 mg total) by mouth every 6 (six) hours as needed for nausea or vomiting. 30 tablet 0   senna-docusate (SENOKOT-S) 8.6-50 MG tablet 1 tablet as needed as needed for constipation     TOPROL XL 100 MG 24 hr tablet Take 100 mg by mouth daily.     vitamin B-12 (CYANOCOBALAMIN) 500 MCG tablet Take 1 tablet by mouth daily.     Current Facility-Administered Medications  Medication Dose Route Frequency Provider Last Rate Last Admin   0.9 %  sodium chloride infusion  500 mL  Intravenous Once Armbruster, Carlota Raspberry, MD       Facility-Administered Medications Ordered in Other Visits  Medication Dose Route Frequency Provider Last Rate Last Admin   cyclophosphamide (CYTOXAN) 700 mg in sodium chloride 0.9 % 250 mL chemo infusion  300 mg/m2 (Order-Specific) Intravenous Once Ledell Hinton IV, MD 570 mL/hr at 02/07/22 1350 700 mg at 02/07/22 1350    REVIEW OF SYSTEMS:   Constitutional: ( - ) fevers, ( - )  chills , ( - ) night sweats Eyes: ( - ) blurriness of vision, ( - ) double vision, ( - ) watery eyes Ears, nose, mouth, throat, and face: ( - ) mucositis, ( - ) sore throat Respiratory: ( - ) cough, ( - ) dyspnea, ( - ) wheezes Cardiovascular: ( - ) palpitation, ( - ) chest discomfort, ( - ) lower extremity swelling Gastrointestinal:  ( - ) nausea, ( - ) heartburn, ( - ) change in bowel habits Skin: ( - ) abnormal skin rashes Lymphatics: ( - ) new lymphadenopathy, ( - ) easy bruising Neurological: ( - ) numbness, ( - ) tingling, ( - ) new weaknesses Behavioral/Psych: ( - ) mood change, ( - ) new changes  All other systems were reviewed with the patient and are negative.  PHYSICAL EXAMINATION: ECOG PERFORMANCE STATUS: 1 - Symptomatic but completely ambulatory  Vitals:   02/07/22 1141  BP: (!) 161/90  Pulse: (!) 58  Resp: 16  Temp: 98 F (36.7 C)  SpO2: 99%   Filed Weights   02/07/22 1141  Weight: 218 lb 11.2 oz (99.2 kg)    GENERAL: Chronically ill-appearing elderly African-American male, alert, no distress and comfortable SKIN: skin color, texture, turgor are normal, no rashes or significant lesions EYES: conjunctiva are pink and non-injected, sclera clear LUNGS: clear to auscultation and percussion with normal breathing effort HEART: regular rate & rhythm and no murmurs and no lower extremity edema ABDOMEN: soft, non-tender, non-distended, normal bowel sounds Musculoskeletal: no cyanosis of digits and no clubbing  PSYCH: alert & oriented x 3, fluent  speech NEURO: no focal motor/sensory deficits  LABORATORY DATA:  I have reviewed the data as listed    Latest Ref Rng & Units 02/07/2022    9:59 AM 01/23/2022    8:41 AM 01/16/2022    7:45 AM  CBC  WBC 4.0 - 10.5 K/uL 5.5   5.7   6.0    Hemoglobin 13.0 - 17.0 g/dL 8.9   8.5   8.3    Hematocrit 39.0 - 52.0 % 27.9   26.1   25.6    Platelets 150 - 400 K/uL 251   264   262         Latest Ref Rng & Units 02/07/2022    9:59 AM 01/23/2022    8:41 AM 12/23/2021   10:11 AM  CMP  Glucose 70 - 99 mg/dL 105   93   101    BUN 8 - 23 mg/dL '19   19   25    ' Creatinine 0.61 - 1.24 mg/dL 2.31   2.34   2.94    Sodium 135 - 145 mmol/L 139   139   137    Potassium 3.5 - 5.1 mmol/L 4.2   4.1   4.4    Chloride 98 - 111 mmol/L 106   104   104    CO2 22 - 32 mmol/L '24   25   23    ' Calcium 8.9 - 10.3 mg/dL 9.5   9.2   9.5    Total Protein 6.5 - 8.1 g/dL 9.6   9.8   10.0    Total Bilirubin 0.3 - 1.2 mg/dL 0.3   0.4   0.4    Alkaline Phos 38 - 126 U/L 68   68   69    AST 15 - 41 U/L '12   13   13    ' ALT 0 - 44 U/L 6  8   8      Lab Results  Component Value Date   MPROTEIN 3.1 (H) 01/23/2022   MPROTEIN 2.9 (H) 12/23/2021   Lab Results  Component Value Date   KPAFRELGTCHN 17.0 01/23/2022   KPAFRELGTCHN 17.7 12/23/2021   KPAFRELGTCHN 1.85 04/26/2015   LAMBDASER 1,228.3 (H) 01/23/2022   LAMBDASER 1,484.5 (H) 12/23/2021   LAMBDASER 3.85 (H) 04/26/2015   KAPLAMBRATIO 0.01 (L) 01/23/2022   KAPLAMBRATIO 0.01 (L) 01/23/2022   KAPLAMBRATIO 0.01 (L) 12/23/2021    RADIOGRAPHIC STUDIES: CT BIOPSY  Result Date: 01/30/2022 CLINICAL DATA:  History of monoclonal gammopathy and concern for potential progression to multiple myeloma. Bone marrow biopsy requested for further evaluation. EXAM: CT GUIDED BONE MARROW ASPIRATION AND BIOPSY ANESTHESIA/SEDATION: Moderate (conscious) sedation was employed during this procedure. A total of Versed 2.0 mg and Fentanyl 100 mcg was administered intravenously by radiology  nursing. Moderate Sedation Time: 10 minutes. The patient's level of consciousness and vital signs were monitored continuously by radiology nursing throughout the procedure under my direct supervision. PROCEDURE: The procedure risks, benefits, and alternatives were explained to the patient. Questions regarding the procedure were encouraged and answered. The patient understands and consents to the procedure. A time out was performed prior to initiating the procedure. The right gluteal region was prepped with chlorhexidine. Sterile gown and sterile gloves were used for the procedure. Local anesthesia was provided with 1% Lidocaine. Under CT guidance, an 11 gauge On Control bone cutting needle was advanced from a posterior approach into the right iliac bone. Needle positioning was confirmed with CT. Initial non heparinized and heparinized aspirate samples were obtained of bone marrow. Core biopsy was performed via the On Control drill needle. COMPLICATIONS: None FINDINGS: Inspection of initial aspirate did reveal visible particles. Intact core biopsy sample was obtained. IMPRESSION: CT guided bone marrow biopsy of right posterior iliac bone with both aspirate and core samples obtained. Electronically Signed   By: Aletta Edouard M.D.   On: 01/30/22 11:39   CT BONE MARROW BIOPSY  Result Date: 30-Jan-2022 CLINICAL DATA:  History of monoclonal gammopathy and concern for potential progression to multiple myeloma. Bone marrow biopsy requested for further evaluation. EXAM: CT GUIDED BONE MARROW ASPIRATION AND BIOPSY ANESTHESIA/SEDATION: Moderate (conscious) sedation was employed during this procedure. A total of Versed 2.0 mg and Fentanyl 100 mcg was administered intravenously by radiology nursing. Moderate Sedation Time: 10 minutes. The patient's level of consciousness and vital signs were monitored continuously by radiology nursing throughout the procedure under my direct supervision. PROCEDURE: The procedure risks,  benefits, and alternatives were explained to the patient. Questions regarding the procedure were encouraged and answered. The patient understands and consents to the procedure. A time out was performed prior to initiating the procedure. The right gluteal region was prepped with chlorhexidine. Sterile gown and sterile gloves were used for the procedure. Local anesthesia was provided with 1% Lidocaine. Under CT guidance, an 11 gauge On Control bone cutting needle was advanced from a posterior approach into the right iliac bone. Needle positioning was confirmed with CT. Initial non heparinized and heparinized aspirate samples were obtained of bone marrow. Core biopsy was performed via the On Control drill needle. COMPLICATIONS: None FINDINGS: Inspection of initial aspirate did reveal visible particles. Intact core biopsy sample was obtained. IMPRESSION: CT guided bone marrow biopsy of right posterior iliac bone with both aspirate and core samples obtained. Electronically Signed   By: Aletta Edouard M.D.   On: Jan 30, 2022 11:39    ASSESSMENT &  PLAN Darren Hinton 76 y.o. male with medical history significant for IgA lambda multiple myeloma who presents for a follow up visit.   Previously we discussed the disease process of multiple myeloma.  We discussed the incurable nature of this disease and the nature of the treatment.  We discussed that this is a cancer of the bone marrow with plasma cells producing an abundance of M protein which in this patient's case causes his severe renal dysfunction.  Additionally we noted that myeloma can cause lytic lesions of the bone, anemia, and increased levels of calcium.  We discussed the treatment moving forward including doublet and triplet therapy.  Given the patient's good health overall, but poor kidney function I would recommend proceeding with CyBorD chemotherapy.  Darren Hinton voices understanding of the treatment regimen and the plan moving forward.  At this time  we will plan to proceed with Velcade 1.5 mg per metered squared subq weekly, cyclophosphamide 300 mg per metered squared IV weekly, and dexamethasone 20 mg p.o. weekly.  Cycles will consist of 4 weekly treatments and will continue for a minimum of 6 cycles before consideration of maintenance therapy.  Additionally if kidney function improves can consider transitioning regimen to VRD.    # IgA lambda Multiple Myeloma -- Bone marrow biopsy performed on 01/16/2022 showed increased plasma cells consistent with a plasma cell neoplasm.  Normal FISH panel. --Patient meets diagnostic criteria based on kidney dysfunction and anemia. --Cycle 1 Day 1 of CyBorD chemotherapy started on 02/07/2022 Plan: -- Labs today show creatinine 2.31, white blood cell count 5.5, hemoglobin 8.9, MCV 108.6, and platelets of 251 --RTC in 2 weeks with interval weekly treatments   #Supportive Care -- chemotherapy education complete -- port placement not required.  -- zofran 31m q8H PRN and compazine 170mPO q6H for nausea -- acyclovir 40066mO BID for VCZ prophylaxis -- allopurinol 300m31m daily for TLS prophylaxis --zometa to start after dental clearance. Will discuss once treatment is underway. -- no pain medication required at this time.    No orders of the defined types were placed in this encounter.   All questions were answered. The patient knows to call the clinic with any problems, questions or concerns.  A total of more than 25 minutes were spent on this encounter with face-to-face time and non-face-to-face time, including preparing to see the patient, ordering tests and/or medications, counseling the patient and coordination of care as outlined above.   Darren Hinton Department of Hematology/Oncology [facility]: [phone]: [phone]: [email or web address]  02/07/2022 2:18 PM

## 2022-02-07 NOTE — Patient Instructions (Signed)
[facility]  Discharge Instructions: Thank you for choosing [facility] to provide your oncology and hematology care.   If you have a lab appointment with the [facility], please go directly to the [facility] and check in at the registration area.   Wear comfortable clothing and clothing appropriate for easy access to any Portacath or PICC line.   We strive to give you quality time with your provider. You may need to reschedule your appointment if you arrive late (15 or more minutes).  Arriving late affects you and other patients whose appointments are after yours.  Also, if you miss three or more appointments without notifying the office, you may be dismissed from the clinic at the provider's discretion.      For prescription refill requests, have your pharmacy contact our office and allow 72 hours for refills to be completed.    Today you received the following chemotherapy and/or immunotherapy agents: Bortezomib (Velcade) and Cyclophosphamide (Cytoxan).   To help prevent nausea and vomiting after your treatment, we encourage you to take your nausea medication as directed.  BELOW ARE SYMPTOMS THAT SHOULD BE REPORTED IMMEDIATELY: *FEVER GREATER THAN 100.4 F (38 C) OR HIGHER *CHILLS OR SWEATING *NAUSEA AND VOMITING THAT IS NOT CONTROLLED WITH YOUR NAUSEA MEDICATION *UNUSUAL SHORTNESS OF BREATH *UNUSUAL BRUISING OR BLEEDING *URINARY PROBLEMS (pain or burning when urinating, or frequent urination) *BOWEL PROBLEMS (unusual diarrhea, constipation, pain near the anus) TENDERNESS IN MOUTH AND THROAT WITH OR WITHOUT PRESENCE OF ULCERS (sore throat, sores in mouth, or a toothache) UNUSUAL RASH, SWELLING OR PAIN  UNUSUAL VAGINAL DISCHARGE OR ITCHING   Items with * indicate a potential emergency and should be followed up as soon as possible or go to the Emergency Department if any problems should occur.  Please show the CHEMOTHERAPY ALERT CARD or  IMMUNOTHERAPY ALERT CARD at check-in to the Emergency Department and triage nurse.  Should you have questions after your visit or need to cancel or reschedule your appointment, please contact [facility]  Dept: [phone]  and follow the prompts.  Office hours are 8:00 a.m. to 4:30 p.m. Monday - Friday. Please note that voicemails left after 4:00 p.m. may not be returned until the following business day.  We are closed weekends and major holidays. You have access to a nurse at all times for urgent questions. Please call the main number to the clinic Dept: [phone] and follow the prompts.   For any non-urgent questions, you may also contact your provider using MyChart. We now offer e-Visits for anyone 80 and older to request care online for non-urgent symptoms. For details visit [email or web address].   Also download the MyChart app! Go to the app store, search "MyChart", open the app, select [facility], and log in with your MyChart username and password.  Due to Covid, a mask is required upon entering the hospital/clinic. If you do not have a mask, one will be given to you upon arrival. For doctor visits, patients may have 1 support person aged 66 or older with them. For treatment visits, patients cannot have anyone with them due to current Covid guidelines and our immunocompromised population.   Bortezomib injection What is this medication? BORTEZOMIB (bor TEZ oh mib) targets proteins in cancer cells and stops the cancer cells from growing. It treats multiple myeloma and mantle cell lymphoma. This medicine may be used for other purposes; ask your health care provider or pharmacist if you  have questions. COMMON BRAND NAME(S): Velcade What should I tell my care team before I take this medication? They need to know if you have any of these conditions: dehydration diabetes (high blood sugar) heart disease liver disease tingling of the fingers or toes or other  nerve disorder an unusual or allergic reaction to bortezomib, mannitol, boron, other medicines, foods, dyes, or preservatives pregnant or trying to get pregnant breast-feeding How should I use this medication? This medicine is injected into a vein or under the skin. It is given by a health care provider in a hospital or clinic setting. Talk to your health care provider about the use of this medicine in children. Special care may be needed. Overdosage: If you think you have taken too much of this medicine contact a poison control center or emergency room at once. NOTE: This medicine is only for you. Do not share this medicine with others. What if I miss a dose? Keep appointments for follow-up doses. It is important not to miss your dose. Call your health care provider if you are unable to keep an appointment. What may interact with this medication? This medicine may interact with the following medications: ketoconazole rifampin This list may not describe all possible interactions. Give your health care provider a list of all the medicines, herbs, non-prescription drugs, or dietary supplements you use. Also tell them if you smoke, drink alcohol, or use illegal drugs. Some items may interact with your medicine. What should I watch for while using this medication? Your condition will be monitored carefully while you are receiving this medicine. You may need blood work done while you are taking this medicine. You may get drowsy or dizzy. Do not drive, use machinery, or do anything that needs mental alertness until you know how this medicine affects you. Do not stand up or sit up quickly, especially if you are an older patient. This reduces the risk of dizzy or fainting spells This medicine may increase your risk of getting an infection. Call your health care provider for advice if you get a fever, chills, sore throat, or other symptoms of a cold or flu. Do not treat yourself. Try to avoid being around  people who are sick. Check with your health care provider if you have severe diarrhea, nausea, and vomiting, or if you sweat a lot. The loss of too much body fluid may make it dangerous for you to take this medicine. Do not become pregnant while taking this medicine or for 7 months after stopping it. Women should inform their health care provider if they wish to become pregnant or think they might be pregnant. Men should not father a child while taking this medicine and for 4 months after stopping it. There is a potential for serious harm to an unborn child. Talk to your health care provider for more information. Do not breast-feed an infant while taking this medicine or for 2 months after stopping it. This medicine may make it more difficult to get pregnant or father a child. Talk to your health care provider if you are concerned about your fertility. What side effects may I notice from receiving this medication? Side effects that you should report to your doctor or health care professional as soon as possible: allergic reactions (skin rash; itching or hives; swelling of the face, lips, or tongue) bleeding (bloody or black, tarry stools; red or dark brown urine; spitting up blood or brown material that looks like coffee grounds; red spots on the skin;  unusual bruising or bleeding from the eye, gums, or nose) blurred vision or changes in vision confusion constipation headache heart failure (trouble breathing; fast, irregular heartbeat; sudden weight gain; swelling of the ankles, feet, hands) infection (fever, chills, cough, sore throat, pain or trouble passing urine) lack or loss of appetite liver injury (dark yellow or brown urine; general ill feeling or flu-like symptoms; loss of appetite, right upper belly pain; yellowing of the eyes or skin) low blood pressure (dizziness; feeling faint or lightheaded, falls; unusually weak or tired) muscle cramps pain, redness, or irritation at site where  injected pain, tingling, numbness in the hands or feet seizures trouble breathing unusual bruising or bleeding Side effects that usually do not require medical attention (report to your doctor or health care professional if they continue or are bothersome): diarrhea nausea stomach pain trouble sleeping vomiting This list may not describe all possible side effects. Call your doctor for medical advice about side effects. You may report side effects to FDA at 1-800-FDA-1088. Where should I keep my medication? This medicine is given in a hospital or clinic. It will not be stored at home. NOTE: This sheet is a summary. It may not cover all possible information. If you have questions about this medicine, talk to your doctor, pharmacist, or health care provider.  2023 Elsevier/Gold Standard (2020-08-16 00:00:00)  Cyclophosphamide Injection What is this medication? CYCLOPHOSPHAMIDE (sye kloe FOSS fa mide) is a chemotherapy drug. It slows the growth of cancer cells. This medicine is used to treat many types of cancer like lymphoma, myeloma, leukemia, breast cancer, and ovarian cancer, to name a few. This medicine may be used for other purposes; ask your health care provider or pharmacist if you have questions. COMMON BRAND NAME(S): Cyclophosphamide, Cytoxan, Neosar What should I tell my care team before I take this medication? They need to know if you have any of these conditions: heart disease history of irregular heartbeat infection kidney disease liver disease low blood counts, like white cells, platelets, or red blood cells on hemodialysis recent or ongoing radiation therapy scarring or thickening of the lungs trouble passing urine an unusual or allergic reaction to cyclophosphamide, other medicines, foods, dyes, or preservatives pregnant or trying to get pregnant breast-feeding How should I use this medication? This drug is usually given as an injection into a vein or muscle or by  infusion into a vein. It is administered in a hospital or clinic by a specially trained health care professional. Talk to your pediatrician regarding the use of this medicine in children. Special care may be needed. Overdosage: If you think you have taken too much of this medicine contact a poison control center or emergency room at once. NOTE: This medicine is only for you. Do not share this medicine with others. What if I miss a dose? It is important not to miss your dose. Call your doctor or health care professional if you are unable to keep an appointment. What may interact with this medication? amphotericin B azathioprine certain antivirals for HIV or hepatitis certain medicines for blood pressure, heart disease, irregular heart beat certain medicines that treat or prevent blood clots like warfarin certain other medicines for cancer cyclosporine etanercept indomethacin medicines that relax muscles for surgery medicines to increase blood counts metronidazole This list may not describe all possible interactions. Give your health care provider a list of all the medicines, herbs, non-prescription drugs, or dietary supplements you use. Also tell them if you smoke, drink alcohol, or use illegal drugs.  Some items may interact with your medicine. What should I watch for while using this medication? Your condition will be monitored carefully while you are receiving this medicine. You may need blood work done while you are taking this medicine. Drink water or other fluids as directed. Urinate often, even at night. Some products may contain alcohol. Ask your health care professional if this medicine contains alcohol. Be sure to tell all health care professionals you are taking this medicine. Certain medicines, like metronidazole and disulfiram, can cause an unpleasant reaction when taken with alcohol. The reaction includes flushing, headache, nausea, vomiting, sweating, and increased thirst. The  reaction can last from 30 minutes to several hours. Do not become pregnant while taking this medicine or for 1 year after stopping it. Women should inform their health care professional if they wish to become pregnant or think they might be pregnant. Men should not father a child while taking this medicine and for 4 months after stopping it. There is potential for serious side effects to an unborn child. Talk to your health care professional for more information. Do not breast-feed an infant while taking this medicine or for 1 week after stopping it. This medicine has caused ovarian failure in some women. This medicine may make it more difficult to get pregnant. Talk to your health care professional if you are concerned about your fertility. This medicine has caused decreased sperm counts in some men. This may make it more difficult to father a child. Talk to your health care professional if you are concerned about your fertility. Call your health care professional for advice if you get a fever, chills, or sore throat, or other symptoms of a cold or flu. Do not treat yourself. This medicine decreases your body's ability to fight infections. Try to avoid being around people who are sick. Avoid taking medicines that contain aspirin, acetaminophen, ibuprofen, naproxen, or ketoprofen unless instructed by your health care professional. These medicines may hide a fever. Talk to your health care professional about your risk of cancer. You may be more at risk for certain types of cancer if you take this medicine. If you are going to need surgery or other procedure, tell your health care professional that you are using this medicine. Be careful brushing or flossing your teeth or using a toothpick because you may get an infection or bleed more easily. If you have any dental work done, tell your dentist you are receiving this medicine. What side effects may I notice from receiving this medication? Side effects that  you should report to your doctor or health care professional as soon as possible: allergic reactions like skin rash, itching or hives, swelling of the face, lips, or tongue breathing problems nausea, vomiting signs and symptoms of bleeding such as bloody or black, tarry stools; red or dark brown urine; spitting up blood or brown material that looks like coffee grounds; red spots on the skin; unusual bruising or bleeding from the eyes, gums, or nose signs and symptoms of heart failure like fast, irregular heartbeat, sudden weight gain; swelling of the ankles, feet, hands signs and symptoms of infection like fever; chills; cough; sore throat; pain or trouble passing urine signs and symptoms of kidney injury like trouble passing urine or change in the amount of urine signs and symptoms of liver injury like dark yellow or brown urine; general ill feeling or flu-like symptoms; light-colored stools; loss of appetite; nausea; right upper belly pain; unusually weak or tired; yellowing of the  eyes or skin Side effects that usually do not require medical attention (report to your doctor or health care professional if they continue or are bothersome): confusion decreased hearing diarrhea facial flushing hair loss headache loss of appetite missed menstrual periods signs and symptoms of low red blood cells or anemia such as unusually weak or tired; feeling faint or lightheaded; falls skin discoloration This list may not describe all possible side effects. Call your doctor for medical advice about side effects. You may report side effects to FDA at 1-800-FDA-1088. Where should I keep my medication? This drug is given in a hospital or clinic and will not be stored at home. NOTE: This sheet is a summary. It may not cover all possible information. If you have questions about this medicine, talk to your doctor, pharmacist, or health care provider.  2023 Elsevier/Gold Standard (2021-07-26 00:00:00)

## 2022-02-10 ENCOUNTER — Telehealth: Payer: Self-pay | Admitting: *Deleted

## 2022-02-10 LAB — KAPPA/LAMBDA LIGHT CHAINS
Kappa free light chain: 15.9 mg/L (ref 3.3–19.4)
Kappa, lambda light chain ratio: 0.01 — ABNORMAL LOW (ref 0.26–1.65)
Lambda free light chains: 1590.2 mg/L — ABNORMAL HIGH (ref 5.7–26.3)

## 2022-02-10 NOTE — Telephone Encounter (Signed)
Called pt to see how he did after his treatment.  He reports doing very well & surprised.  Only symptom reported was hiccoughs but states they come & go & not really bothering him.  Instructed to call if continues or worse.  He knows his next appt & how to reach [place] if needed.

## 2022-02-10 NOTE — Telephone Encounter (Signed)
-----   Message from Lester [place], RN sent at 02/07/2022  2:59 PM EDT ----- Regarding: Dr. [name] Pt. first time Velcade and Cytoxan. Tolerated treatment well and no issues noted. Please remind him to take oral decadron morning of treatment. Thank you and have a great day! :)

## 2022-02-12 LAB — MULTIPLE MYELOMA PANEL, SERUM
Albumin SerPl Elph-Mcnc: 4 g/dL (ref 2.9–4.4)
Albumin/Glob SerPl: 0.8 (ref 0.7–1.7)
Alpha 1: 0.2 g/dL (ref 0.0–0.4)
Alpha2 Glob SerPl Elph-Mcnc: 0.5 g/dL (ref 0.4–1.0)
B-Globulin SerPl Elph-Mcnc: 1.2 g/dL (ref 0.7–1.3)
Gamma Glob SerPl Elph-Mcnc: 3.7 g/dL — ABNORMAL HIGH (ref 0.4–1.8)
Globulin, Total: 5.6 g/dL — ABNORMAL HIGH (ref 2.2–3.9)
IgA: 4320 mg/dL — ABNORMAL HIGH (ref 61–437)
IgG (Immunoglobin G), Serum: 589 mg/dL — ABNORMAL LOW (ref 603–1613)
IgM (Immunoglobulin M), Srm: 13 mg/dL — ABNORMAL LOW (ref 15–143)
M Protein SerPl Elph-Mcnc: 2.4 g/dL — ABNORMAL HIGH
Total Protein ELP: 9.6 g/dL — ABNORMAL HIGH (ref 6.0–8.5)

## 2022-02-14 ENCOUNTER — Inpatient Hospital Stay: Payer: No Typology Code available for payment source

## 2022-02-17 ENCOUNTER — Inpatient Hospital Stay: Payer: No Typology Code available for payment source

## 2022-02-17 ENCOUNTER — Other Ambulatory Visit: Payer: Self-pay

## 2022-02-17 VITALS — BP 152/84 | HR 63 | Temp 98.0°F | Resp 18 | Wt 221.5 lb

## 2022-02-17 DIAGNOSIS — C9000 Multiple myeloma not having achieved remission: Secondary | ICD-10-CM

## 2022-02-17 DIAGNOSIS — Z5112 Encounter for antineoplastic immunotherapy: Secondary | ICD-10-CM | POA: Diagnosis not present

## 2022-02-17 LAB — CMP (CANCER CENTER ONLY)
ALT: 9 U/L (ref 0–44)
AST: 13 U/L — ABNORMAL LOW (ref 15–41)
Albumin: 4.1 g/dL (ref 3.5–5.0)
Alkaline Phosphatase: 80 U/L (ref 38–126)
Anion gap: 9 (ref 5–15)
BUN: 22 mg/dL (ref 8–23)
CO2: 22 mmol/L (ref 22–32)
Calcium: 9.3 mg/dL (ref 8.9–10.3)
Chloride: 104 mmol/L (ref 98–111)
Creatinine: 1.98 mg/dL — ABNORMAL HIGH (ref 0.61–1.24)
GFR, Estimated: 34 mL/min — ABNORMAL LOW (ref >60)
Glucose, Bld: 95 mg/dL (ref 70–99)
Potassium: 4.6 mmol/L (ref 3.5–5.1)
Sodium: 135 mmol/L (ref 135–145)
Total Bilirubin: 0.4 mg/dL (ref 0.3–1.2)
Total Protein: 8.8 g/dL — ABNORMAL HIGH (ref 6.5–8.1)

## 2022-02-17 LAB — CBC WITH DIFFERENTIAL (CANCER CENTER ONLY)
Abs Immature Granulocytes: 0 10*3/uL (ref 0.00–0.07)
Basophils Absolute: 0 10*3/uL (ref 0.0–0.1)
Basophils Relative: 1 %
Eosinophils Absolute: 0 10*3/uL (ref 0.0–0.5)
Eosinophils Relative: 1 %
HCT: 27.7 % — ABNORMAL LOW (ref 39.0–52.0)
Hemoglobin: 8.9 g/dL — ABNORMAL LOW (ref 13.0–17.0)
Immature Granulocytes: 0 %
Lymphocytes Relative: 19 %
Lymphs Abs: 0.7 10*3/uL (ref 0.7–4.0)
MCH: 34.6 pg — ABNORMAL HIGH (ref 26.0–34.0)
MCHC: 32.1 g/dL (ref 30.0–36.0)
MCV: 107.8 fL — ABNORMAL HIGH (ref 80.0–100.0)
Monocytes Absolute: 0.1 10*3/uL (ref 0.1–1.0)
Monocytes Relative: 3 %
Neutro Abs: 2.8 10*3/uL (ref 1.7–7.7)
Neutrophils Relative %: 76 %
Platelet Count: 262 10*3/uL (ref 150–400)
RBC: 2.57 MIL/uL — ABNORMAL LOW (ref 4.22–5.81)
RDW: 15.5 % (ref 11.5–15.5)
WBC Count: 3.7 10*3/uL — ABNORMAL LOW (ref 4.0–10.5)
nRBC: 0 % (ref 0.0–0.2)

## 2022-02-17 LAB — LACTATE DEHYDROGENASE: LDH: 105 U/L (ref 98–192)

## 2022-02-17 MED ORDER — PALONOSETRON HCL INJECTION 0.25 MG/5ML
0.2500 mg | Freq: Once | INTRAVENOUS | Status: AC
  Administered 2022-02-17: 0.25 mg via INTRAVENOUS
  Filled 2022-02-17: qty 5

## 2022-02-17 MED ORDER — SODIUM CHLORIDE 0.9 % IV SOLN
300.0000 mg/m2 | Freq: Once | INTRAVENOUS | Status: AC
  Administered 2022-02-17: 700 mg via INTRAVENOUS
  Filled 2022-02-17: qty 35

## 2022-02-17 MED ORDER — SODIUM CHLORIDE 0.9 % IV SOLN: Freq: Once | INTRAVENOUS | Status: AC

## 2022-02-17 MED ORDER — BORTEZOMIB CHEMO SQ INJECTION 3.5 MG (2.5MG/ML)
1.5000 mg/m2 | Freq: Once | INTRAMUSCULAR | Status: AC
  Administered 2022-02-17: 3.5 mg via SUBCUTANEOUS
  Filled 2022-02-17: qty 1.4

## 2022-02-17 NOTE — Progress Notes (Signed)
Pt states he took decadron at home today.   Per [name] MD, ok to treat with SCR 1.98.

## 2022-02-17 NOTE — Patient Instructions (Signed)
[facility]  Discharge Instructions: Thank you for choosing [facility] to provide your oncology and hematology care.   If you have a lab appointment with the [facility], please go directly to the [facility] and check in at the registration area.   Wear comfortable clothing and clothing appropriate for easy access to any Portacath or PICC line.   We strive to give you quality time with your provider. You may need to reschedule your appointment if you arrive late (15 or more minutes).  Arriving late affects you and other patients whose appointments are after yours.  Also, if you miss three or more appointments without notifying the office, you may be dismissed from the clinic at the provider's discretion.      For prescription refill requests, have your pharmacy contact our office and allow 72 hours for refills to be completed.    Today you received the following chemotherapy and/or immunotherapy agents: Bortezomib, Cytoxan.       To help prevent nausea and vomiting after your treatment, we encourage you to take your nausea medication as directed.  BELOW ARE SYMPTOMS THAT SHOULD BE REPORTED IMMEDIATELY: *FEVER GREATER THAN 100.4 F (38 C) OR HIGHER *CHILLS OR SWEATING *NAUSEA AND VOMITING THAT IS NOT CONTROLLED WITH YOUR NAUSEA MEDICATION *UNUSUAL SHORTNESS OF BREATH *UNUSUAL BRUISING OR BLEEDING *URINARY PROBLEMS (pain or burning when urinating, or frequent urination) *BOWEL PROBLEMS (unusual diarrhea, constipation, pain near the anus) TENDERNESS IN MOUTH AND THROAT WITH OR WITHOUT PRESENCE OF ULCERS (sore throat, sores in mouth, or a toothache) UNUSUAL RASH, SWELLING OR PAIN  UNUSUAL VAGINAL DISCHARGE OR ITCHING   Items with * indicate a potential emergency and should be followed up as soon as possible or go to the Emergency Department if any problems should occur.  Please show the CHEMOTHERAPY ALERT CARD or IMMUNOTHERAPY ALERT CARD at  check-in to the Emergency Department and triage nurse.  Should you have questions after your visit or need to cancel or reschedule your appointment, please contact [facility]  Dept: [phone]  and follow the prompts.  Office hours are 8:00 a.m. to 4:30 p.m. Monday - Friday. Please note that voicemails left after 4:00 p.m. may not be returned until the following business day.  We are closed weekends and major holidays. You have access to a nurse at all times for urgent questions. Please call the main number to the clinic Dept: [phone] and follow the prompts.   For any non-urgent questions, you may also contact your provider using MyChart. We now offer e-Visits for anyone 12 and older to request care online for non-urgent symptoms. For details visit [email or web address].   Also download the MyChart app! Go to the app store, search "MyChart", open the app, select [facility], and log in with your MyChart username and password.  Due to Covid, a mask is required upon entering the hospital/clinic. If you do not have a mask, one will be given to you upon arrival. For doctor visits, patients may have 1 support person aged 22 or older with them. For treatment visits, patients cannot have anyone with them due to current Covid guidelines and our immunocompromised population.

## 2022-02-17 NOTE — Progress Notes (Signed)
OK to treat with Creatinine of 1.98 per Dr. [name]

## 2022-02-21 ENCOUNTER — Inpatient Hospital Stay: Payer: No Typology Code available for payment source | Admitting: Hematology and Oncology

## 2022-02-21 ENCOUNTER — Inpatient Hospital Stay: Payer: No Typology Code available for payment source

## 2022-02-24 ENCOUNTER — Other Ambulatory Visit: Payer: Self-pay

## 2022-02-24 ENCOUNTER — Inpatient Hospital Stay (HOSPITAL_BASED_OUTPATIENT_CLINIC_OR_DEPARTMENT_OTHER): Payer: No Typology Code available for payment source | Admitting: Hematology and Oncology

## 2022-02-24 ENCOUNTER — Inpatient Hospital Stay: Payer: No Typology Code available for payment source

## 2022-02-24 VITALS — BP 122/69 | HR 55 | Temp 98.0°F | Resp 18 | Ht 77.0 in | Wt 216.9 lb

## 2022-02-24 DIAGNOSIS — N184 Chronic kidney disease, stage 4 (severe): Secondary | ICD-10-CM | POA: Diagnosis not present

## 2022-02-24 DIAGNOSIS — C9000 Multiple myeloma not having achieved remission: Secondary | ICD-10-CM

## 2022-02-24 DIAGNOSIS — Z5112 Encounter for antineoplastic immunotherapy: Secondary | ICD-10-CM | POA: Diagnosis not present

## 2022-02-24 DIAGNOSIS — D631 Anemia in chronic kidney disease: Secondary | ICD-10-CM | POA: Diagnosis not present

## 2022-02-24 LAB — CBC WITH DIFFERENTIAL (CANCER CENTER ONLY)
Abs Immature Granulocytes: 0.01 10*3/uL (ref 0.00–0.07)
Basophils Absolute: 0 10*3/uL (ref 0.0–0.1)
Basophils Relative: 1 %
Eosinophils Absolute: 0.1 10*3/uL (ref 0.0–0.5)
Eosinophils Relative: 3 %
HCT: 28 % — ABNORMAL LOW (ref 39.0–52.0)
Hemoglobin: 9.2 g/dL — ABNORMAL LOW (ref 13.0–17.0)
Immature Granulocytes: 0 %
Lymphocytes Relative: 30 %
Lymphs Abs: 1.2 10*3/uL (ref 0.7–4.0)
MCH: 35.1 pg — ABNORMAL HIGH (ref 26.0–34.0)
MCHC: 32.9 g/dL (ref 30.0–36.0)
MCV: 106.9 fL — ABNORMAL HIGH (ref 80.0–100.0)
Monocytes Absolute: 0.6 10*3/uL (ref 0.1–1.0)
Monocytes Relative: 15 %
Neutro Abs: 2 10*3/uL (ref 1.7–7.7)
Neutrophils Relative %: 51 %
Platelet Count: 240 10*3/uL (ref 150–400)
RBC: 2.62 MIL/uL — ABNORMAL LOW (ref 4.22–5.81)
RDW: 14.7 % (ref 11.5–15.5)
WBC Count: 3.9 10*3/uL — ABNORMAL LOW (ref 4.0–10.5)
nRBC: 0.5 % — ABNORMAL HIGH (ref 0.0–0.2)

## 2022-02-24 LAB — CMP (CANCER CENTER ONLY)
ALT: 9 U/L (ref 0–44)
AST: 12 U/L — ABNORMAL LOW (ref 15–41)
Albumin: 4 g/dL (ref 3.5–5.0)
Alkaline Phosphatase: 105 U/L (ref 38–126)
Anion gap: 6 (ref 5–15)
BUN: 22 mg/dL (ref 8–23)
CO2: 23 mmol/L (ref 22–32)
Calcium: 9.1 mg/dL (ref 8.9–10.3)
Chloride: 110 mmol/L (ref 98–111)
Creatinine: 1.89 mg/dL — ABNORMAL HIGH (ref 0.61–1.24)
GFR, Estimated: 36 mL/min — ABNORMAL LOW (ref >60)
Glucose, Bld: 90 mg/dL (ref 70–99)
Potassium: 4 mmol/L (ref 3.5–5.1)
Sodium: 139 mmol/L (ref 135–145)
Total Bilirubin: 0.5 mg/dL (ref 0.3–1.2)
Total Protein: 7.7 g/dL (ref 6.5–8.1)

## 2022-02-24 LAB — LACTATE DEHYDROGENASE: LDH: 95 U/L — ABNORMAL LOW (ref 98–192)

## 2022-02-24 MED ORDER — SODIUM CHLORIDE 0.9 % IV SOLN: Freq: Once | INTRAVENOUS | Status: AC

## 2022-02-24 MED ORDER — BORTEZOMIB CHEMO SQ INJECTION 3.5 MG (2.5MG/ML)
1.5000 mg/m2 | Freq: Once | INTRAMUSCULAR | Status: AC
  Administered 2022-02-24: 3.5 mg via SUBCUTANEOUS
  Filled 2022-02-24: qty 1.4

## 2022-02-24 MED ORDER — PALONOSETRON HCL INJECTION 0.25 MG/5ML
0.2500 mg | Freq: Once | INTRAVENOUS | Status: AC
  Administered 2022-02-24: 0.25 mg via INTRAVENOUS
  Filled 2022-02-24: qty 5

## 2022-02-24 MED ORDER — SODIUM CHLORIDE 0.9 % IV SOLN
300.0000 mg/m2 | Freq: Once | INTRAVENOUS | Status: AC
  Administered 2022-02-24: 700 mg via INTRAVENOUS
  Filled 2022-02-24: qty 35

## 2022-02-24 NOTE — Progress Notes (Signed)
Ok to treat with elevated Scr of 1.89 per Dr [name]. Pt reports he took steroids at home prior to arrival.

## 2022-02-24 NOTE — Patient Instructions (Signed)
[facility]   Discharge Instructions: Thank you for choosing [facility] to provide your oncology and hematology care.   If you have a lab appointment with the Cancer Center, please go directly to the Cancer Center and check in at the registration area.   Wear comfortable clothing and clothing appropriate for easy access to any Portacath or PICC line.   We strive to give you quality time with your provider. You may need to reschedule your appointment if you arrive late (15 or more minutes).  Arriving late affects you and other patients whose appointments are after yours.  Also, if you miss three or more appointments without notifying the office, you may be dismissed from the clinic at the provider's discretion.      For prescription refill requests, have your pharmacy contact our office and allow 72 hours for refills to be completed.    Today you received the following chemotherapy and/or immunotherapy agents: bortezomib and cyclophosphamide      To help prevent nausea and vomiting after your treatment, we encourage you to take your nausea medication as directed.  BELOW ARE SYMPTOMS THAT SHOULD BE REPORTED IMMEDIATELY: *FEVER GREATER THAN 100.4 F (38 C) OR HIGHER *CHILLS OR SWEATING *NAUSEA AND VOMITING THAT IS NOT CONTROLLED WITH YOUR NAUSEA MEDICATION *UNUSUAL SHORTNESS OF BREATH *UNUSUAL BRUISING OR BLEEDING *URINARY PROBLEMS (pain or burning when urinating, or frequent urination) *BOWEL PROBLEMS (unusual diarrhea, constipation, pain near the anus) TENDERNESS IN MOUTH AND THROAT WITH OR WITHOUT PRESENCE OF ULCERS (sore throat, sores in mouth, or a toothache) UNUSUAL RASH, SWELLING OR PAIN  UNUSUAL VAGINAL DISCHARGE OR ITCHING   Items with * indicate a potential emergency and should be followed up as soon as possible or go to the Emergency Department if any problems should occur.  Please show the CHEMOTHERAPY ALERT CARD or IMMUNOTHERAPY  ALERT CARD at check-in to the Emergency Department and triage nurse.  Should you have questions after your visit or need to cancel or reschedule your appointment, please contact [facility]  Dept: [phone]  and follow the prompts.  Office hours are 8:00 a.m. to 4:30 p.m. Monday - Friday. Please note that voicemails left after 4:00 p.m. may not be returned until the following business day.  We are closed weekends and major holidays. You have access to a nurse at all times for urgent questions. Please call the main number to the clinic Dept: [phone] and follow the prompts.   For any non-urgent questions, you may also contact your provider using MyChart. We now offer e-Visits for anyone 18 and older to request care online for non-urgent symptoms. For details visit [email or web address].   Also download the MyChart app! Go to the app store, search "MyChart", open the app, select [facility], and log in with your MyChart username and password.  Masks are optional in the cancer centers. If you would like for your care team to wear a mask while they are taking care of you, please let them know. For doctor visits, patients may have with them one support person who is at least 77 years old. At this time, visitors are not allowed in the infusion area. 

## 2022-02-24 NOTE — Progress Notes (Signed)
[place] [phone]   [phone]  PROGRESS NOTE  Patient Care Team: [name], MD as PCP - General (Internal Medicine)  Hematological/Oncological History # IgA Lambda Multiple Myeloma 05/03/2015: last visit with Dr. Julien Nordmann at the [facility]. Was followed for IgA lambda MGUS. 12/11/2021: labs show M protein 3.4, Kappa 19.2, Lambda 1576.4, ratio 0.01. Cr 3.47, Hgb 8.6, WBC 5.7, MCV 99, Plt 221 12/23/2021: establish care with Dr. [name]  01/16/2022: Bone marrow biopsy performed, showed a 60% cellular bone marrow predominantly comprised of plasma cells making up 70 to 80%, lambda restricted.  Myeloma FISH panel showed no evidence of abnormalities. 02/07/2022: Cycle 1 Day 1 of CyBorD chemotherapy.   Interval History:  Darren Hinton. 76 y.o. male with medical history significant for IgA lambda multiple myeloma who presents for a follow up visit. The patient's last visit was on 02/07/2022. In the interim since the last visit he started chemotherapy.  On exam today Darren Hinton is accompanied by his wife.  He reports chemo is off to a good start.  He notes he is not having any major side effects.  He did have some diarrhea last week though he was taking laxatives due to constipation.  He reports that it resolved quickly with Pepto-Bismol treatment.  He notes that now he is more towards the constipation side.  He notes he is not having any numbness or tingling of his fingers or toes.  Additionally his energy levels are quite good and he is noticing a big boost after receiving steroid therapy.  He notes that overall his appetite is good and he feels well, possibly even better than he did before the start of treatment.  He reports that he is not having any issues with fevers, chills, sweats, nausea, vomiting or diarrhea.  A full 10 point ROS is otherwise negative.  MEDICAL HISTORY:  Past Medical History:  Diagnosis Date   Allergy    Anxiety    Arthritis     Depression    Heart murmur    Hyperlipidemia    Hyperplastic colon polyp    Hyperproteinemia    Hypertension    Hypothyroidism    Mitral valve prolapse    Paroxysmal atrial fibrillation (HCC)    Thyroid disease     SURGICAL HISTORY: No past surgical history on file.  SOCIAL HISTORY: Social History   Socioeconomic History   Marital status: Married    Spouse name: Not on file   Number of children: Not on file   Years of education: Not on file   Highest education level: Not on file  Occupational History   Not on file  Tobacco Use   Smoking status: Former    Years: 20.00    Types: Cigarettes    Quit date: 10/01/1978    Years since quitting: 43.4   Smokeless tobacco: Never  Substance and Sexual Activity   Alcohol use: Yes    Alcohol/week: 2.0 standard drinks of alcohol    Types: 2 Glasses of wine per week    Comment: occasionally   Drug use: No   Sexual activity: Not on file  Other Topics Concern   Not on file  Social History Narrative   Not on file   Social Determinants of Health   Financial Resource Strain: Not on file  Food Insecurity: Not on file  Transportation Needs: Not on file  Physical Activity: Not on file  Stress: Not on file  Social Connections: Not on  file  Intimate Partner Violence: Not on file    FAMILY HISTORY: Family History  Problem Relation Age of Onset   Cancer Father    Colon cancer Neg Hx    Esophageal cancer Neg Hx    Liver cancer Neg Hx    Pancreatic cancer Neg Hx    Rectal cancer Neg Hx    Stomach cancer Neg Hx     ALLERGIES:  is allergic to zithromax [azithromycin].  MEDICATIONS:  Current Outpatient Medications  Medication Sig Dispense Refill   acyclovir (ZOVIRAX) 400 MG tablet Take 1 tablet (400 mg total) by mouth 2 (two) times daily. 180 tablet 1   allopurinol (ZYLOPRIM) 300 MG tablet Take 1 tablet (300 mg total) by mouth daily. 90 tablet 1   ALPRAZolam (XANAX) 0.25 MG tablet Take 0.25 mg by mouth daily as needed.  Anxiety      amLODipine (NORVASC) 5 MG tablet      atorvastatin (LIPITOR) 20 MG tablet Take 20 mg by mouth daily.     chlorhexidine (PERIDEX) 0.12 % solution SMARTSIG:By Mouth     cromolyn (NASALCROM) 5.2 MG/ACT nasal spray Place 1 spray into the nose 3 (three) times daily as needed. Allergies      dexamethasone (DECADRON) 4 MG tablet Take 10 tablets (40 mg total) by mouth once a week. Take steroids pills the in the morning on chemotherapy days. 120 tablet 1   ergocalciferol (VITAMIN D2) 1.25 MG (50000 UT) capsule TAKE ONE CAPSULE BY MOUTH EVERY 7 DAYS     fluticasone (FLONASE) 50 MCG/ACT nasal spray Place 2 sprays into the nose daily as needed. Allergies      gabapentin (NEURONTIN) 300 MG capsule TAKE ONE CAPSULE BY MOUTH TWICE A DAY FOR NERVE PAIN     ipratropium (ATROVENT) 0.03 % nasal spray PLACE 1 SPRAY INTO EACH NOSTRIL UP TO 3 TIMES DAILY AS NEEDED FOR CONGESTION  5   levothyroxine (SYNTHROID) 100 MCG tablet 1 tablet in the morning on an empty stomach     loratadine (CLARITIN) 10 MG tablet TAKE 1 TABLET BY MOUTH DAILY AS NEEDED FOR ALLERGIES     meclizine (ANTIVERT) 25 MG tablet Take 1 tablet (25 mg total) by mouth 3 (three) times daily as needed for dizziness. 30 tablet 0   ondansetron (ZOFRAN) 8 MG tablet Take 1 tablet (8 mg total) by mouth every 8 (eight) hours as needed. 30 tablet 0   polyethylene glycol powder (GLYCOLAX/MIRALAX) 17 GM/SCOOP powder take 17 grams     prochlorperazine (COMPAZINE) 10 MG tablet Take 1 tablet (10 mg total) by mouth every 6 (six) hours as needed for nausea or vomiting. 30 tablet 0   senna-docusate (SENOKOT-S) 8.6-50 MG tablet 1 tablet as needed as needed for constipation     TOPROL XL 100 MG 24 hr tablet Take 100 mg by mouth daily.     vitamin B-12 (CYANOCOBALAMIN) 500 MCG tablet Take 1 tablet by mouth daily.     Current Facility-Administered Medications  Medication Dose Route Frequency Provider Last Rate Last Admin   0.9 %  sodium chloride infusion   500 mL Intravenous Once Armbruster, Carlota Raspberry, MD       Facility-Administered Medications Ordered in Other Visits  Medication Dose Route Frequency Provider Last Rate Last Admin   cyclophosphamide (CYTOXAN) 700 mg in sodium chloride 0.9 % 250 mL chemo infusion  300 mg/m2 (Order-Specific) Intravenous Once Orson Slick, MD 570 mL/hr at 02/24/22 1207 700 mg at 02/24/22 1207  REVIEW OF SYSTEMS:   Constitutional: ( - ) fevers, ( - )  chills , ( - ) night sweats Eyes: ( - ) blurriness of vision, ( - ) double vision, ( - ) watery eyes Ears, nose, mouth, throat, and face: ( - ) mucositis, ( - ) sore throat Respiratory: ( - ) cough, ( - ) dyspnea, ( - ) wheezes Cardiovascular: ( - ) palpitation, ( - ) chest discomfort, ( - ) lower extremity swelling Gastrointestinal:  ( - ) nausea, ( - ) heartburn, ( - ) change in bowel habits Skin: ( - ) abnormal skin rashes Lymphatics: ( - ) new lymphadenopathy, ( - ) easy bruising Neurological: ( - ) numbness, ( - ) tingling, ( - ) new weaknesses Behavioral/Psych: ( - ) mood change, ( - ) new changes  All other systems were reviewed with the patient and are negative.  PHYSICAL EXAMINATION: ECOG PERFORMANCE STATUS: 1 - Symptomatic but completely ambulatory  Vitals:   02/24/22 1019  BP: 122/69  Pulse: (!) 55  Resp: 18  Temp: 98 F (36.7 C)  SpO2: 98%   Filed Weights   02/24/22 1019  Weight: 216 lb 14.4 oz (98.4 kg)    GENERAL: Chronically ill-appearing elderly African-American male, alert, no distress and comfortable SKIN: skin color, texture, turgor are normal, no rashes or significant lesions EYES: conjunctiva are pink and non-injected, sclera clear LUNGS: clear to auscultation and percussion with normal breathing effort HEART: regular rate & rhythm and no murmurs and no lower extremity edema ABDOMEN: soft, non-tender, non-distended, normal bowel sounds Musculoskeletal: no cyanosis of digits and no clubbing  PSYCH: alert & oriented x 3,  fluent speech NEURO: no focal motor/sensory deficits  LABORATORY DATA:  I have reviewed the data as listed    Latest Ref Rng & Units 02/24/2022    9:53 AM 02/17/2022    2:22 PM 02/07/2022    9:59 AM  CBC  WBC 4.0 - 10.5 K/uL 3.9  3.7  5.5   Hemoglobin 13.0 - 17.0 g/dL 9.2  8.9  8.9   Hematocrit 39.0 - 52.0 % 28.0  27.7  27.9   Platelets 150 - 400 K/uL 240  262  251        Latest Ref Rng & Units 02/24/2022    9:53 AM 02/17/2022    2:22 PM 02/07/2022    9:59 AM  CMP  Glucose 70 - 99 mg/dL 90  95  105   BUN 8 - 23 mg/dL '22  22  19   ' Creatinine 0.61 - 1.24 mg/dL 1.89  1.98  2.31   Sodium 135 - 145 mmol/L 139  135  139   Potassium 3.5 - 5.1 mmol/L 4.0  4.6  4.2   Chloride 98 - 111 mmol/L 110  104  106   CO2 22 - 32 mmol/L '23  22  24   ' Calcium 8.9 - 10.3 mg/dL 9.1  9.3  9.5   Total Protein 6.5 - 8.1 g/dL 7.7  8.8  9.6   Total Bilirubin 0.3 - 1.2 mg/dL 0.5  0.4  0.3   Alkaline Phos 38 - 126 U/L 105  80  68   AST 15 - 41 U/L '12  13  12   ' ALT 0 - 44 U/L '9  9  6     ' Lab Results  Component Value Date   MPROTEIN 2.4 (H) 02/07/2022   MPROTEIN 3.1 (H) 01/23/2022   MPROTEIN 2.9 (H) 12/23/2021  Lab Results  Component Value Date   KPAFRELGTCHN 15.9 02/07/2022   KPAFRELGTCHN 17.0 01/23/2022   KPAFRELGTCHN 17.7 12/23/2021   LAMBDASER 1,590.2 (H) 02/07/2022   LAMBDASER 1,228.3 (H) 01/23/2022   LAMBDASER 1,484.5 (H) 12/23/2021   KAPLAMBRATIO 0.01 (L) 02/07/2022   KAPLAMBRATIO 0.01 (L) 01/23/2022   KAPLAMBRATIO 0.01 (L) 01/23/2022    RADIOGRAPHIC STUDIES: No results found.  ASSESSMENT & PLAN Darren Hinton. 76 y.o. male with medical history significant for IgA lambda multiple myeloma who presents for a follow up visit.   Previously we discussed the disease process of multiple myeloma.  We discussed the incurable nature of this disease and the nature of the treatment.  We discussed that this is a cancer of the bone marrow with plasma cells producing an abundance of M protein  which in this patient's case causes his severe renal dysfunction.  Additionally we noted that myeloma can cause lytic lesions of the bone, anemia, and increased levels of calcium.  We discussed the treatment moving forward including doublet and triplet therapy.  Given the patient's good health overall, but poor kidney function I would recommend proceeding with CyBorD chemotherapy.  Darren Hinton voices understanding of the treatment regimen and the plan moving forward.  At this time we will plan to proceed with Velcade 1.5 mg per metered squared subq weekly, cyclophosphamide 300 mg per metered squared IV weekly, and dexamethasone 20 mg p.o. weekly.  Cycles will consist of 4 weekly treatments and will continue for a minimum of 6 cycles before consideration of maintenance therapy.  Additionally if kidney function improves can consider transitioning regimen to VRD.    # IgA lambda Multiple Myeloma -- Bone marrow biopsy performed on 01/16/2022 showed increased plasma cells consistent with a plasma cell neoplasm.  Normal FISH panel. --Patient meets diagnostic criteria based on kidney dysfunction and anemia. --Cycle 1 Day 1 of CyBorD chemotherapy started on 02/07/2022 Plan: -- Labs today show creatinine 1.89, white blood cell count 3.9, hemoglobin 9.2, MCV 106.9 and platelets of 240 --today is Cycle 1 Day 15 of CyBorD --RTC in 2 weeks with interval weekly treatments   #Supportive Care -- chemotherapy education complete -- port placement not required.  -- zofran 56m q8H PRN and compazine 158mPO q6H for nausea -- acyclovir 40042mO BID for VCZ prophylaxis -- allopurinol 300m30m daily for TLS prophylaxis --zometa to start after dental clearance. Will discuss once treatment is underway. -- no pain medication required at this time.   No orders of the defined types were placed in this encounter.  All questions were answered. The patient knows to call the clinic with any problems, questions or concerns.  A  total of more than 30 minutes were spent on this encounter with face-to-face time and non-face-to-face time, including preparing to see the patient, ordering tests and/or medications, counseling the patient and coordination of care as outlined above.   JohnLedell Hinton Department of Hematology/Oncology [facility]: [phone]: [phone]: [email or web address]  02/24/2022 12:16 PM

## 2022-02-28 ENCOUNTER — Inpatient Hospital Stay: Payer: No Typology Code available for payment source

## 2022-03-03 ENCOUNTER — Inpatient Hospital Stay: Payer: No Typology Code available for payment source

## 2022-03-03 ENCOUNTER — Other Ambulatory Visit: Payer: Self-pay

## 2022-03-03 VITALS — BP 128/65 | HR 63 | Temp 98.1°F | Resp 18 | Wt 218.8 lb

## 2022-03-03 DIAGNOSIS — C9000 Multiple myeloma not having achieved remission: Secondary | ICD-10-CM

## 2022-03-03 DIAGNOSIS — Z5112 Encounter for antineoplastic immunotherapy: Secondary | ICD-10-CM | POA: Diagnosis not present

## 2022-03-03 LAB — CMP (CANCER CENTER ONLY)
ALT: 13 U/L (ref 0–44)
AST: 14 U/L — ABNORMAL LOW (ref 15–41)
Albumin: 4.2 g/dL (ref 3.5–5.0)
Alkaline Phosphatase: 120 U/L (ref 38–126)
Anion gap: 7 (ref 5–15)
BUN: 24 mg/dL — ABNORMAL HIGH (ref 8–23)
CO2: 19 mmol/L — ABNORMAL LOW (ref 22–32)
Calcium: 8.8 mg/dL — ABNORMAL LOW (ref 8.9–10.3)
Chloride: 113 mmol/L — ABNORMAL HIGH (ref 98–111)
Creatinine: 1.89 mg/dL — ABNORMAL HIGH (ref 0.61–1.24)
GFR, Estimated: 36 mL/min — ABNORMAL LOW (ref >60)
Glucose, Bld: 123 mg/dL — ABNORMAL HIGH (ref 70–99)
Potassium: 4.3 mmol/L (ref 3.5–5.1)
Sodium: 139 mmol/L (ref 135–145)
Total Bilirubin: 0.4 mg/dL (ref 0.3–1.2)
Total Protein: 7.2 g/dL (ref 6.5–8.1)

## 2022-03-03 LAB — CBC WITH DIFFERENTIAL (CANCER CENTER ONLY)
Abs Immature Granulocytes: 0.01 10*3/uL (ref 0.00–0.07)
Basophils Absolute: 0 10*3/uL (ref 0.0–0.1)
Basophils Relative: 0 %
Eosinophils Absolute: 0 10*3/uL (ref 0.0–0.5)
Eosinophils Relative: 1 %
HCT: 25.6 % — ABNORMAL LOW (ref 39.0–52.0)
Hemoglobin: 8.4 g/dL — ABNORMAL LOW (ref 13.0–17.0)
Immature Granulocytes: 0 %
Lymphocytes Relative: 8 %
Lymphs Abs: 0.3 10*3/uL — ABNORMAL LOW (ref 0.7–4.0)
MCH: 35.1 pg — ABNORMAL HIGH (ref 26.0–34.0)
MCHC: 32.8 g/dL (ref 30.0–36.0)
MCV: 107.1 fL — ABNORMAL HIGH (ref 80.0–100.0)
Monocytes Absolute: 0.1 10*3/uL (ref 0.1–1.0)
Monocytes Relative: 2 %
Neutro Abs: 3.7 10*3/uL (ref 1.7–7.7)
Neutrophils Relative %: 89 %
Platelet Count: 206 10*3/uL (ref 150–400)
RBC: 2.39 MIL/uL — ABNORMAL LOW (ref 4.22–5.81)
RDW: 14.8 % (ref 11.5–15.5)
WBC Count: 4.2 10*3/uL (ref 4.0–10.5)
nRBC: 0.7 % — ABNORMAL HIGH (ref 0.0–0.2)

## 2022-03-03 LAB — LACTATE DEHYDROGENASE: LDH: 125 U/L (ref 98–192)

## 2022-03-03 MED ORDER — SODIUM CHLORIDE 0.9 % IV SOLN
300.0000 mg/m2 | Freq: Once | INTRAVENOUS | Status: AC
  Administered 2022-03-03: 700 mg via INTRAVENOUS
  Filled 2022-03-03: qty 35

## 2022-03-03 MED ORDER — PALONOSETRON HCL INJECTION 0.25 MG/5ML
0.2500 mg | Freq: Once | INTRAVENOUS | Status: AC
  Administered 2022-03-03: 0.25 mg via INTRAVENOUS
  Filled 2022-03-03: qty 5

## 2022-03-03 MED ORDER — SODIUM CHLORIDE 0.9 % IV SOLN: Freq: Once | INTRAVENOUS | Status: AC

## 2022-03-03 MED ORDER — BORTEZOMIB CHEMO SQ INJECTION 3.5 MG (2.5MG/ML)
1.5000 mg/m2 | Freq: Once | INTRAMUSCULAR | Status: AC
  Administered 2022-03-03: 3.5 mg via SUBCUTANEOUS
  Filled 2022-03-03: qty 1.4

## 2022-03-04 LAB — KAPPA/LAMBDA LIGHT CHAINS
Kappa free light chain: 14.1 mg/L (ref 3.3–19.4)
Kappa, lambda light chain ratio: 0.89 (ref 0.26–1.65)
Lambda free light chains: 15.8 mg/L (ref 5.7–26.3)

## 2022-03-05 ENCOUNTER — Telehealth: Payer: Self-pay | Admitting: *Deleted

## 2022-03-05 NOTE — Telephone Encounter (Signed)
Received call from pt's wife,Linda. She states that Darren Hinton is having significant diarrhea after his treatments. He received Cytoan and Velcade on 03/03/22 and had had difficult to control diarrhea since then.  Wife states she has been giving him PeptoBismal. Advised to stop that medication and to start Imodium. Instructed to take 2 capsules/tablets after intial diarrhea of the day and then 1 capsule after each loose stool but no more than 8 in 24 hours. Advised to push oral fluids-gatorade, pedialyte etc as he can become dehydrated with the diarrhea he is having.  Vaughan Basta voiced understanding. Advised to call back if the imodium does not help the situation. She said she would,  Dr. [name] made aware of the situation.

## 2022-03-06 LAB — MULTIPLE MYELOMA PANEL, SERUM
Albumin SerPl Elph-Mcnc: 3.9 g/dL (ref 2.9–4.4)
Albumin/Glob SerPl: 1.3 (ref 0.7–1.7)
Alpha 1: 0.2 g/dL (ref 0.0–0.4)
Alpha2 Glob SerPl Elph-Mcnc: 0.6 g/dL (ref 0.4–1.0)
B-Globulin SerPl Elph-Mcnc: 1 g/dL (ref 0.7–1.3)
Gamma Glob SerPl Elph-Mcnc: 1.3 g/dL (ref 0.4–1.8)
Globulin, Total: 3.1 g/dL (ref 2.2–3.9)
IgA: 1211 mg/dL — ABNORMAL HIGH (ref 61–437)
IgG (Immunoglobin G), Serum: 588 mg/dL — ABNORMAL LOW (ref 603–1613)
IgM (Immunoglobulin M), Srm: 11 mg/dL — ABNORMAL LOW (ref 15–143)
M Protein SerPl Elph-Mcnc: 0.7 g/dL — ABNORMAL HIGH
Total Protein ELP: 7 g/dL (ref 6.0–8.5)

## 2022-03-07 ENCOUNTER — Inpatient Hospital Stay: Payer: No Typology Code available for payment source

## 2022-03-07 ENCOUNTER — Inpatient Hospital Stay: Payer: No Typology Code available for payment source | Admitting: Hematology and Oncology

## 2022-03-08 ENCOUNTER — Other Ambulatory Visit: Payer: Self-pay

## 2022-03-08 ENCOUNTER — Emergency Department (HOSPITAL_COMMUNITY): Payer: No Typology Code available for payment source

## 2022-03-08 ENCOUNTER — Emergency Department (HOSPITAL_COMMUNITY)
Admission: EM | Admit: 2022-03-08 | Discharge: 2022-03-09 | Disposition: A | Discharge status: Home / Self Care | Payer: No Typology Code available for payment source | Attending: Emergency Medicine | Admitting: Emergency Medicine

## 2022-03-08 ENCOUNTER — Encounter (HOSPITAL_COMMUNITY): Payer: Self-pay

## 2022-03-08 DIAGNOSIS — J189 Pneumonia, unspecified organism: Secondary | ICD-10-CM | POA: Diagnosis not present

## 2022-03-08 DIAGNOSIS — R509 Fever, unspecified: Secondary | ICD-10-CM | POA: Diagnosis not present

## 2022-03-08 DIAGNOSIS — R197 Diarrhea, unspecified: Secondary | ICD-10-CM | POA: Insufficient documentation

## 2022-03-08 DIAGNOSIS — R791 Abnormal coagulation profile: Secondary | ICD-10-CM | POA: Insufficient documentation

## 2022-03-08 DIAGNOSIS — Z20822 Contact with and (suspected) exposure to covid-19: Secondary | ICD-10-CM | POA: Insufficient documentation

## 2022-03-08 DIAGNOSIS — R531 Weakness: Secondary | ICD-10-CM | POA: Diagnosis not present

## 2022-03-08 LAB — CBC WITH DIFFERENTIAL/PLATELET
Abs Immature Granulocytes: 0.01 10*3/uL (ref 0.00–0.07)
Basophils Absolute: 0 10*3/uL (ref 0.0–0.1)
Basophils Relative: 0 %
Eosinophils Absolute: 0 10*3/uL (ref 0.0–0.5)
Eosinophils Relative: 0 %
HCT: 28.3 % — ABNORMAL LOW (ref 39.0–52.0)
Hemoglobin: 9.2 g/dL — ABNORMAL LOW (ref 13.0–17.0)
Immature Granulocytes: 0 %
Lymphocytes Relative: 6 %
Lymphs Abs: 0.2 10*3/uL — ABNORMAL LOW (ref 0.7–4.0)
MCH: 34.6 pg — ABNORMAL HIGH (ref 26.0–34.0)
MCHC: 32.5 g/dL (ref 30.0–36.0)
MCV: 106.4 fL — ABNORMAL HIGH (ref 80.0–100.0)
Monocytes Absolute: 0.5 10*3/uL (ref 0.1–1.0)
Monocytes Relative: 13 %
Neutro Abs: 2.9 10*3/uL (ref 1.7–7.7)
Neutrophils Relative %: 81 %
Platelets: 124 10*3/uL — ABNORMAL LOW (ref 150–400)
RBC: 2.66 MIL/uL — ABNORMAL LOW (ref 4.22–5.81)
RDW: 14.2 % (ref 11.5–15.5)
WBC: 3.6 10*3/uL — ABNORMAL LOW (ref 4.0–10.5)
nRBC: 1.4 % — ABNORMAL HIGH (ref 0.0–0.2)

## 2022-03-08 LAB — COMPREHENSIVE METABOLIC PANEL
ALT: 14 U/L (ref 0–44)
AST: 17 U/L (ref 15–41)
Albumin: 3.8 g/dL (ref 3.5–5.0)
Alkaline Phosphatase: 95 U/L (ref 38–126)
Anion gap: 8 (ref 5–15)
BUN: 20 mg/dL (ref 8–23)
CO2: 18 mmol/L — ABNORMAL LOW (ref 22–32)
Calcium: 8.7 mg/dL — ABNORMAL LOW (ref 8.9–10.3)
Chloride: 113 mmol/L — ABNORMAL HIGH (ref 98–111)
Creatinine, Ser: 1.98 mg/dL — ABNORMAL HIGH (ref 0.61–1.24)
GFR, Estimated: 34 mL/min — ABNORMAL LOW (ref >60)
Glucose, Bld: 140 mg/dL — ABNORMAL HIGH (ref 70–99)
Potassium: 3.6 mmol/L (ref 3.5–5.1)
Sodium: 139 mmol/L (ref 135–145)
Total Bilirubin: 1.1 mg/dL (ref 0.3–1.2)
Total Protein: 6.9 g/dL (ref 6.5–8.1)

## 2022-03-08 LAB — LACTIC ACID, PLASMA: Lactic Acid, Venous: 1.2 mmol/L (ref 0.5–1.9)

## 2022-03-08 MED ORDER — LACTATED RINGERS IV BOLUS
1000.0000 mL | Freq: Once | INTRAVENOUS | Status: AC
  Administered 2022-03-08: 1000 mL via INTRAVENOUS

## 2022-03-08 NOTE — ED Triage Notes (Signed)
Pt reports with a fever (101.4 F). Last chemo tx was on Monday. Pt reports having diarrhea Tuesday and Wednesday.

## 2022-03-08 NOTE — ED Provider Notes (Signed)
[facility] DEPT Provider Note   CSN: [id number] Arrival date & time: 03/08/22  2211     History {Add pertinent medical, surgical, social history, OB history to HPI:1} Chief Complaint  Patient presents with   Fever    Darren Hinton. is a 76 y.o. male.  Patient presents to the emergency department for evaluation of generalized weakness and fever.  Patient has been feeling weak through the course of today and then had some chills tonight.  Wife took his temperature and it was 101.4.  Patient reports that he received his third chemo treatment earlier this week.  Patient currently treated for multiple myeloma.  Patient denies any other associated symptoms.  He has not had any cough, chest congestion, vomiting, abdominal pain, urinary symptoms.  He did have diarrhea the day after his chemo treatment which is typical, he was given Imodium and has improved.       Home Medications Prior to Admission medications   Medication Sig Start Date End Date Taking? Authorizing Provider  acyclovir (ZOVIRAX) 400 MG tablet Take 1 tablet (400 mg total) by mouth 2 (two) times daily. 01/30/22   Orson Slick, MD  allopurinol (ZYLOPRIM) 300 MG tablet Take 1 tablet (300 mg total) by mouth daily. 01/30/22   Orson Slick, MD  ALPRAZolam Duanne Moron) 0.25 MG tablet Take 0.25 mg by mouth daily as needed. Anxiety     [name], MD  amLODipine (NORVASC) 5 MG tablet  10/24/14   [provider]  atorvastatin (LIPITOR) 20 MG tablet Take 20 mg by mouth daily.    [provider]  chlorhexidine (PERIDEX) 0.12 % solution SMARTSIG:By Mouth 11/21/21   [provider]  cromolyn (NASALCROM) 5.2 MG/ACT nasal spray Place 1 spray into the nose 3 (three) times daily as needed. Allergies     [provider]  dexamethasone (DECADRON) 4 MG tablet Take 10 tablets (40 mg total) by mouth once a week. Take steroids pills the in the morning on chemotherapy  days. 01/30/22   Orson Slick, MD  ergocalciferol (VITAMIN D2) 1.25 MG (50000 UT) capsule Take 50,000 Units by mouth once a week. 10/27/20   [provider]  fluticasone (FLONASE) 50 MCG/ACT nasal spray Place 2 sprays into the nose daily as needed. Allergies     [provider]  gabapentin (NEURONTIN) 300 MG capsule TAKE ONE CAPSULE BY MOUTH TWICE A DAY FOR NERVE PAIN 04/15/19   [provider]  ipratropium (ATROVENT) 0.03 % nasal spray PLACE 1 SPRAY INTO EACH NOSTRIL UP TO 3 TIMES DAILY AS NEEDED FOR CONGESTION 04/12/15   [provider]  levothyroxine (SYNTHROID) 100 MCG tablet 1 tablet in the morning on an empty stomach 10/14/21   [provider]  loratadine (CLARITIN) 10 MG tablet TAKE 1 TABLET BY MOUTH DAILY AS NEEDED FOR ALLERGIES 01/04/20   [provider]  losartan-hydrochlorothiazide (HYZAAR) 50-12.5 MG tablet Take 1 tablet by mouth daily. 01/31/22   [provider]  meclizine (ANTIVERT) 25 MG tablet Take 1 tablet (25 mg total) by mouth 3 (three) times daily as needed for dizziness. 03/20/13   Shawnee Knapp, MD  ondansetron (ZOFRAN) 8 MG tablet Take 1 tablet (8 mg total) by mouth every 8 (eight) hours as needed. 01/30/22   Orson Slick, MD  polyethylene glycol powder (GLYCOLAX/MIRALAX) 17 GM/SCOOP powder take 17 grams 11/26/21   [provider]  prochlorperazine (COMPAZINE) 10 MG tablet Take 1  tablet (10 mg total) by mouth every 6 (six) hours as needed for nausea or vomiting. 01/30/22   Orson Slick, MD  senna-docusate (SENOKOT-S) 8.6-50 MG tablet 1 tablet as needed as needed for constipation 11/26/21   [provider]  TOPROL XL 100 MG 24 hr tablet Take 100 mg by mouth daily. 04/12/14   [provider]  vitamin B-12 (CYANOCOBALAMIN) 500 MCG tablet Take 1 tablet by mouth daily. 07/19/15   [provider]      Allergies    Zithromax [azithromycin]    Review of Systems   Review of  Systems  Physical Exam Updated Vital Signs BP 113/73   Pulse 99   Temp (!) 102.9 F (39.4 C) (Oral)   Resp 18   Ht '6\' 4"'  (1.93 m)   Wt 98.9 kg   SpO2 99%   BMI 26.54 kg/m  Physical Exam Vitals and nursing note reviewed.  Constitutional:      General: He is not in acute distress.    Appearance: He is well-developed.  HENT:     Head: Normocephalic and atraumatic.     Mouth/Throat:     Mouth: Mucous membranes are moist.  Eyes:     General: Vision grossly intact. Gaze aligned appropriately.     Extraocular Movements: Extraocular movements intact.     Conjunctiva/sclera: Conjunctivae normal.  Cardiovascular:     Rate and Rhythm: Normal rate and regular rhythm.     Pulses: Normal pulses.     Heart sounds: Normal heart sounds, S1 normal and S2 normal. No murmur heard.    No friction rub. No gallop.  Pulmonary:     Effort: Pulmonary effort is normal. No respiratory distress.     Breath sounds: Normal breath sounds.  Abdominal:     Palpations: Abdomen is soft.     Tenderness: There is no abdominal tenderness. There is no guarding or rebound.     Hernia: No hernia is present.  Musculoskeletal:        General: No swelling.     Cervical back: Full passive range of motion without pain, normal range of motion and neck supple. No pain with movement, spinous process tenderness or muscular tenderness. Normal range of motion.     Right lower leg: No edema.     Left lower leg: No edema.  Skin:    General: Skin is warm and dry.     Capillary Refill: Capillary refill takes less than 2 seconds.     Findings: No ecchymosis, erythema, lesion or wound.  Neurological:     Mental Status: He is alert and oriented to person, place, and time.     GCS: GCS eye subscore is 4. GCS verbal subscore is 5. GCS motor subscore is 6.     Cranial Nerves: Cranial nerves 2-12 are intact.     Sensory: Sensation is intact.     Motor: Motor function is intact. No weakness or abnormal muscle tone.      Coordination: Coordination is intact.  Psychiatric:        Mood and Affect: Mood normal.        Speech: Speech normal.        Behavior: Behavior normal.     ED Results / Procedures / Treatments   Labs (all labs ordered are listed, but only abnormal results are displayed) Labs Reviewed  CULTURE, BLOOD (ROUTINE X 2)  CULTURE, BLOOD (ROUTINE X 2)  URINE CULTURE  SARS CORONAVIRUS 2 BY RT PCR  CBC WITH DIFFERENTIAL/PLATELET  COMPREHENSIVE METABOLIC PANEL  LACTIC ACID, PLASMA  LACTIC ACID, PLASMA  PROTIME-INR  URINALYSIS, ROUTINE W REFLEX MICROSCOPIC    EKG None  Radiology No results found.  Procedures Procedures  {Document cardiac monitor, telemetry assessment procedure when appropriate:1}  Medications Ordered in ED Medications  lactated ringers bolus 1,000 mL (has no administration in time range)    ED Course/ Medical Decision Making/ A&P                           Medical Decision Making Amount and/or Complexity of Data Reviewed Labs: ordered. Radiology: ordered. ECG/medicine tests: ordered.   ***  {Document critical care time when appropriate:1} {Document review of labs and clinical decision tools ie heart score, Chads2Vasc2 etc:1}  {Document your independent review of radiology images, and any outside records:1} {Document your discussion with family members, caretakers, and with consultants:1} {Document social determinants of health affecting pt's care:1} {Document your decision making why or why not admission, treatments were needed:1} Final Clinical Impression(s) / ED Diagnoses Final diagnoses:  None    Rx / DC Orders ED Discharge Orders     None

## 2022-03-08 NOTE — ED Notes (Signed)
Pt advised the need for urine specimen, provided urinal.

## 2022-03-09 ENCOUNTER — Emergency Department (HOSPITAL_COMMUNITY): Payer: No Typology Code available for payment source

## 2022-03-09 ENCOUNTER — Other Ambulatory Visit: Payer: Self-pay

## 2022-03-09 ENCOUNTER — Inpatient Hospital Stay (HOSPITAL_COMMUNITY)
Admission: EM | Admit: 2022-03-09 | Discharge: 2022-03-17 | DRG: 194 | Disposition: A | Discharge status: Home / Self Care | Payer: No Typology Code available for payment source | Attending: Internal Medicine | Admitting: Internal Medicine

## 2022-03-09 ENCOUNTER — Encounter (HOSPITAL_COMMUNITY): Payer: Self-pay

## 2022-03-09 DIAGNOSIS — E871 Hypo-osmolality and hyponatremia: Secondary | ICD-10-CM | POA: Diagnosis present

## 2022-03-09 DIAGNOSIS — I43 Cardiomyopathy in diseases classified elsewhere: Secondary | ICD-10-CM

## 2022-03-09 DIAGNOSIS — I340 Nonrheumatic mitral (valve) insufficiency: Secondary | ICD-10-CM | POA: Diagnosis not present

## 2022-03-09 DIAGNOSIS — F419 Anxiety disorder, unspecified: Secondary | ICD-10-CM | POA: Diagnosis present

## 2022-03-09 DIAGNOSIS — C9000 Multiple myeloma not having achieved remission: Secondary | ICD-10-CM | POA: Diagnosis present

## 2022-03-09 DIAGNOSIS — Z87891 Personal history of nicotine dependence: Secondary | ICD-10-CM

## 2022-03-09 DIAGNOSIS — K7689 Other specified diseases of liver: Secondary | ICD-10-CM

## 2022-03-09 DIAGNOSIS — I4892 Unspecified atrial flutter: Secondary | ICD-10-CM | POA: Diagnosis present

## 2022-03-09 DIAGNOSIS — E861 Hypovolemia: Secondary | ICD-10-CM | POA: Diagnosis present

## 2022-03-09 DIAGNOSIS — Z79890 Hormone replacement therapy (postmenopausal): Secondary | ICD-10-CM

## 2022-03-09 DIAGNOSIS — E872 Acidosis, unspecified: Secondary | ICD-10-CM | POA: Diagnosis present

## 2022-03-09 DIAGNOSIS — I4891 Unspecified atrial fibrillation: Secondary | ICD-10-CM

## 2022-03-09 DIAGNOSIS — D631 Anemia in chronic kidney disease: Secondary | ICD-10-CM | POA: Diagnosis present

## 2022-03-09 DIAGNOSIS — R000 Tachycardia, unspecified: Secondary | ICD-10-CM | POA: Diagnosis present

## 2022-03-09 DIAGNOSIS — N1832 Chronic kidney disease, stage 3b: Secondary | ICD-10-CM | POA: Diagnosis present

## 2022-03-09 DIAGNOSIS — I129 Hypertensive chronic kidney disease with stage 1 through stage 4 chronic kidney disease, or unspecified chronic kidney disease: Secondary | ICD-10-CM | POA: Diagnosis present

## 2022-03-09 DIAGNOSIS — I428 Other cardiomyopathies: Secondary | ICD-10-CM | POA: Diagnosis present

## 2022-03-09 DIAGNOSIS — Z20822 Contact with and (suspected) exposure to covid-19: Secondary | ICD-10-CM | POA: Diagnosis present

## 2022-03-09 DIAGNOSIS — D7589 Other specified diseases of blood and blood-forming organs: Secondary | ICD-10-CM | POA: Diagnosis present

## 2022-03-09 DIAGNOSIS — I4819 Other persistent atrial fibrillation: Secondary | ICD-10-CM | POA: Diagnosis present

## 2022-03-09 DIAGNOSIS — E1122 Type 2 diabetes mellitus with diabetic chronic kidney disease: Secondary | ICD-10-CM | POA: Diagnosis present

## 2022-03-09 DIAGNOSIS — D696 Thrombocytopenia, unspecified: Secondary | ICD-10-CM | POA: Diagnosis present

## 2022-03-09 DIAGNOSIS — E039 Hypothyroidism, unspecified: Secondary | ICD-10-CM | POA: Diagnosis present

## 2022-03-09 DIAGNOSIS — J189 Pneumonia, unspecified organism: Principal | ICD-10-CM

## 2022-03-09 DIAGNOSIS — F32A Depression, unspecified: Secondary | ICD-10-CM | POA: Diagnosis present

## 2022-03-09 DIAGNOSIS — R5081 Fever presenting with conditions classified elsewhere: Secondary | ICD-10-CM

## 2022-03-09 DIAGNOSIS — E785 Hyperlipidemia, unspecified: Secondary | ICD-10-CM | POA: Diagnosis present

## 2022-03-09 DIAGNOSIS — D509 Iron deficiency anemia, unspecified: Secondary | ICD-10-CM | POA: Diagnosis not present

## 2022-03-09 DIAGNOSIS — D539 Nutritional anemia, unspecified: Secondary | ICD-10-CM | POA: Diagnosis present

## 2022-03-09 DIAGNOSIS — R6510 Systemic inflammatory response syndrome (SIRS) of non-infectious origin without acute organ dysfunction: Secondary | ICD-10-CM

## 2022-03-09 DIAGNOSIS — R509 Fever, unspecified: Secondary | ICD-10-CM | POA: Diagnosis present

## 2022-03-09 DIAGNOSIS — E876 Hypokalemia: Secondary | ICD-10-CM | POA: Diagnosis present

## 2022-03-09 DIAGNOSIS — I480 Paroxysmal atrial fibrillation: Secondary | ICD-10-CM | POA: Diagnosis not present

## 2022-03-09 DIAGNOSIS — Z79899 Other long term (current) drug therapy: Secondary | ICD-10-CM

## 2022-03-09 DIAGNOSIS — M7989 Other specified soft tissue disorders: Secondary | ICD-10-CM | POA: Diagnosis not present

## 2022-03-09 DIAGNOSIS — I10 Essential (primary) hypertension: Secondary | ICD-10-CM | POA: Diagnosis not present

## 2022-03-09 DIAGNOSIS — N179 Acute kidney failure, unspecified: Secondary | ICD-10-CM | POA: Diagnosis present

## 2022-03-09 DIAGNOSIS — M1990 Unspecified osteoarthritis, unspecified site: Secondary | ICD-10-CM | POA: Diagnosis present

## 2022-03-09 DIAGNOSIS — Z881 Allergy status to other antibiotic agents status: Secondary | ICD-10-CM

## 2022-03-09 DIAGNOSIS — I088 Other rheumatic multiple valve diseases: Secondary | ICD-10-CM | POA: Diagnosis not present

## 2022-03-09 LAB — MAGNESIUM: Magnesium: 2.2 mg/dL (ref 1.7–2.4)

## 2022-03-09 LAB — URINALYSIS, ROUTINE W REFLEX MICROSCOPIC
Bacteria, UA: NONE SEEN
Bilirubin Urine: NEGATIVE
Glucose, UA: NEGATIVE mg/dL
Ketones, ur: NEGATIVE mg/dL
Leukocytes,Ua: NEGATIVE
Nitrite: NEGATIVE
Protein, ur: 30 mg/dL — AB
Specific Gravity, Urine: 1.016 (ref 1.005–1.030)
pH: 6 (ref 5.0–8.0)

## 2022-03-09 LAB — CBC WITH DIFFERENTIAL/PLATELET
Abs Immature Granulocytes: 0.03 10*3/uL (ref 0.00–0.07)
Basophils Absolute: 0 10*3/uL (ref 0.0–0.1)
Basophils Relative: 0 %
Eosinophils Absolute: 0 10*3/uL (ref 0.0–0.5)
Eosinophils Relative: 0 %
HCT: 25.5 % — ABNORMAL LOW (ref 39.0–52.0)
Hemoglobin: 8.6 g/dL — ABNORMAL LOW (ref 13.0–17.0)
Immature Granulocytes: 1 %
Lymphocytes Relative: 6 %
Lymphs Abs: 0.3 10*3/uL — ABNORMAL LOW (ref 0.7–4.0)
MCH: 35.2 pg — ABNORMAL HIGH (ref 26.0–34.0)
MCHC: 33.7 g/dL (ref 30.0–36.0)
MCV: 104.5 fL — ABNORMAL HIGH (ref 80.0–100.0)
Monocytes Absolute: 0.4 10*3/uL (ref 0.1–1.0)
Monocytes Relative: 10 %
Neutro Abs: 3.6 10*3/uL (ref 1.7–7.7)
Neutrophils Relative %: 83 %
Platelets: 99 10*3/uL — ABNORMAL LOW (ref 150–400)
RBC: 2.44 MIL/uL — ABNORMAL LOW (ref 4.22–5.81)
RDW: 14.3 % (ref 11.5–15.5)
WBC: 4.3 10*3/uL (ref 4.0–10.5)
nRBC: 0 % (ref 0.0–0.2)

## 2022-03-09 LAB — PROTIME-INR
INR: 1.1 (ref 0.8–1.2)
INR: 1.1 (ref 0.8–1.2)
Prothrombin Time: 13.6 seconds (ref 11.4–15.2)
Prothrombin Time: 14.5 seconds (ref 11.4–15.2)

## 2022-03-09 LAB — COMPREHENSIVE METABOLIC PANEL
ALT: 15 U/L (ref 0–44)
AST: 25 U/L (ref 15–41)
Albumin: 3.6 g/dL (ref 3.5–5.0)
Alkaline Phosphatase: 76 U/L (ref 38–126)
Anion gap: 8 (ref 5–15)
BUN: 19 mg/dL (ref 8–23)
CO2: 18 mmol/L — ABNORMAL LOW (ref 22–32)
Calcium: 7.9 mg/dL — ABNORMAL LOW (ref 8.9–10.3)
Chloride: 108 mmol/L (ref 98–111)
Creatinine, Ser: 2.01 mg/dL — ABNORMAL HIGH (ref 0.61–1.24)
GFR, Estimated: 34 mL/min — ABNORMAL LOW (ref >60)
Glucose, Bld: 106 mg/dL — ABNORMAL HIGH (ref 70–99)
Potassium: 3.4 mmol/L — ABNORMAL LOW (ref 3.5–5.1)
Sodium: 134 mmol/L — ABNORMAL LOW (ref 135–145)
Total Bilirubin: 1 mg/dL (ref 0.3–1.2)
Total Protein: 6.7 g/dL (ref 6.5–8.1)

## 2022-03-09 LAB — SARS CORONAVIRUS 2 BY RT PCR: SARS Coronavirus 2 by RT PCR: NEGATIVE

## 2022-03-09 LAB — LACTIC ACID, PLASMA: Lactic Acid, Venous: 0.9 mmol/L (ref 0.5–1.9)

## 2022-03-09 MED ORDER — CEFEPIME HCL 2 G IV SOLR
2.0000 g | Freq: Two times a day (BID) | INTRAVENOUS | Status: DC
Start: 2022-03-10 — End: 2022-03-14
  Administered 2022-03-10 – 2022-03-14 (×9): 2 g via INTRAVENOUS
  Filled 2022-03-09 (×9): qty 12.5

## 2022-03-09 MED ORDER — SODIUM CHLORIDE 0.9 % IV SOLN
1.0000 g | Freq: Once | INTRAVENOUS | Status: AC
  Administered 2022-03-09: 1 g via INTRAVENOUS
  Filled 2022-03-09: qty 10

## 2022-03-09 MED ORDER — LEVOTHYROXINE SODIUM 100 MCG PO TABS
100.0000 ug | ORAL_TABLET | Freq: Every day | ORAL | Status: DC
  Administered 2022-03-10 – 2022-03-17 (×8): 100 ug via ORAL
  Filled 2022-03-09 (×8): qty 1

## 2022-03-09 MED ORDER — OXYCODONE HCL 5 MG PO TABS: 5.0000 mg | ORAL_TABLET | Freq: Four times a day (QID) | ORAL | Status: DC | PRN

## 2022-03-09 MED ORDER — POLYETHYLENE GLYCOL 3350 17 G PO PACK
17.0000 g | PACK | Freq: Every day | ORAL | Status: DC | PRN
  Administered 2022-03-11 – 2022-03-13 (×3): 17 g via ORAL
  Filled 2022-03-09 (×3): qty 1

## 2022-03-09 MED ORDER — ACETAMINOPHEN 325 MG PO TABS
650.0000 mg | ORAL_TABLET | Freq: Four times a day (QID) | ORAL | Status: DC | PRN
  Administered 2022-03-10 (×2): 650 mg via ORAL
  Filled 2022-03-09 (×2): qty 2

## 2022-03-09 MED ORDER — ENOXAPARIN SODIUM 40 MG/0.4ML IJ SOSY
40.0000 mg | PREFILLED_SYRINGE | Freq: Every day | INTRAMUSCULAR | Status: DC
Start: 2022-03-09 — End: 2022-03-11
  Administered 2022-03-10 (×2): 40 mg via SUBCUTANEOUS
  Filled 2022-03-09 (×2): qty 0.4

## 2022-03-09 MED ORDER — ONDANSETRON HCL 4 MG/2ML IJ SOLN: 4.0000 mg | Freq: Four times a day (QID) | INTRAMUSCULAR | Status: DC | PRN

## 2022-03-09 MED ORDER — ACETAMINOPHEN 325 MG PO TABS
650.0000 mg | ORAL_TABLET | Freq: Once | ORAL | Status: AC
  Administered 2022-03-09: 650 mg via ORAL
  Filled 2022-03-09: qty 2

## 2022-03-09 MED ORDER — SODIUM CHLORIDE 0.9 % IV BOLUS
1000.0000 mL | Freq: Once | INTRAVENOUS | Status: AC
Start: 2022-03-09 — End: 2022-03-09
  Administered 2022-03-09: 1000 mL via INTRAVENOUS

## 2022-03-09 MED ORDER — HYDROMORPHONE HCL 1 MG/ML IJ SOLN: 0.5000 mg | INTRAMUSCULAR | Status: DC | PRN

## 2022-03-09 MED ORDER — POTASSIUM CHLORIDE 2 MEQ/ML IV SOLN
INTRAVENOUS | Status: AC
  Filled 2022-03-09 (×3): qty 1000

## 2022-03-09 MED ORDER — MELATONIN 5 MG PO TABS: 5.0000 mg | ORAL_TABLET | Freq: Every evening | ORAL | Status: DC | PRN

## 2022-03-09 MED ORDER — CEFDINIR 300 MG PO CAPS
300.0000 mg | ORAL_CAPSULE | Freq: Two times a day (BID) | ORAL | 0 refills | Status: DC
Start: 2022-03-09 — End: 2022-03-17

## 2022-03-09 MED ORDER — METOPROLOL SUCCINATE ER 25 MG PO TB24
12.5000 mg | ORAL_TABLET | Freq: Every day | ORAL | Status: DC
  Administered 2022-03-10 – 2022-03-12 (×3): 12.5 mg via ORAL
  Filled 2022-03-09 (×3): qty 1

## 2022-03-09 MED ORDER — CEFEPIME HCL 2 G IV SOLR
2.0000 g | Freq: Once | INTRAVENOUS | Status: AC
  Administered 2022-03-09: 2 g via INTRAVENOUS
  Filled 2022-03-09: qty 12.5

## 2022-03-09 NOTE — ED Notes (Signed)
Receiving RN [facility] De[facility] has agreed to accept [facility] once pt has arrived to inpatient unit, all questions and concerns address.

## 2022-03-09 NOTE — ED Notes (Signed)
Verbal order to d/c 2nd lactic acid

## 2022-03-09 NOTE — ED Notes (Signed)
Dr. Melina Copa at the bedside

## 2022-03-09 NOTE — ED Provider Notes (Signed)
[facility] DEPT Provider Note   CSN: [id number] Arrival date & time: 03/09/22  2022     History  Chief Complaint  Patient presents with   Fever    Darren Hinton. is a 76 y.o. male.  He has a history of multiple myeloma and follows with Dr. [name].  He just completed his fourth dose of chemotherapy this past week.  He had 2 days of diarrhea which improved with Imodium.  He was seen yesterday for fever, lab work cultures done.  He was given a dose of Rocephin and sent home on Omnicef.  Today had another fever of 102.  States he feels tired but otherwise well.  May be having a little bit of stinging when he urinates.  No cough no headache no sore throat abdominal pain vomiting diarrhea or rashes.  The history is provided by the patient and the spouse.  Fever Max temp prior to arrival:  102 Temp source:  Oral Duration:  2 days Timing:  Intermittent Progression:  Unchanged Chronicity:  New Relieved by:  None tried Worsened by:  Nothing Ineffective treatments:  None tried Associated symptoms: dysuria   Associated symptoms: no chest pain, no chills, no confusion, no congestion, no cough, no diarrhea, no headaches, no nausea, no rash, no rhinorrhea and no vomiting   Risk factors: hx of cancer and immunosuppression   Risk factors: no sick contacts        Home Medications Prior to Admission medications   Medication Sig Start Date End Date Taking? Authorizing Provider  cefdinir (OMNICEF) 300 MG capsule Take 1 capsule (300 mg total) by mouth 2 (two) times daily. 03/09/22  Yes Pollina, Gwenyth Allegra, MD  acyclovir (ZOVIRAX) 400 MG tablet Take 1 tablet (400 mg total) by mouth 2 (two) times daily. 01/30/22   Orson Slick, MD  allopurinol (ZYLOPRIM) 300 MG tablet Take 1 tablet (300 mg total) by mouth daily. 01/30/22   Orson Slick, MD  ALPRAZolam Duanne Moron) 0.25 MG tablet Take 0.25 mg by mouth daily as needed. Anxiety     [name], MD   amLODipine (NORVASC) 5 MG tablet Take 5 mg by mouth daily. 10/24/14   [provider]  atorvastatin (LIPITOR) 20 MG tablet Take 20 mg by mouth daily.    [provider]  dexamethasone (DECADRON) 4 MG tablet Take 10 tablets (40 mg total) by mouth once a week. Take steroids pills the in the morning on chemotherapy days. 01/30/22   Orson Slick, MD  ergocalciferol (VITAMIN D2) 1.25 MG (50000 UT) capsule Take 50,000 Units by mouth every Wednesday. 10/27/20   [provider]  fluticasone (FLONASE) 50 MCG/ACT nasal spray Place 2 sprays into the nose daily as needed. Allergies     [provider]  levothyroxine (SYNTHROID) 100 MCG tablet Take 100 mcg by mouth daily before breakfast. 10/14/21   [provider]  loratadine (CLARITIN) 10 MG tablet Take 10 mg by mouth daily. 01/04/20   [provider]  losartan-hydrochlorothiazide (HYZAAR) 50-12.5 MG tablet Take 1 tablet by mouth daily. 01/31/22   [provider]  meclizine (ANTIVERT) 25 MG tablet Take 1 tablet (25 mg total) by mouth 3 (three) times daily as needed for dizziness. 03/20/13   Shawnee Knapp, MD  metoprolol succinate (TOPROL-XL) 100 MG 24 hr tablet Take 100 mg by mouth daily. Take with or immediately following a meal.    [provider]  ondansetron (ZOFRAN) 8  MG tablet Take 1 tablet (8 mg total) by mouth every 8 (eight) hours as needed. 01/30/22   Orson Slick, MD  prochlorperazine (COMPAZINE) 10 MG tablet Take 1 tablet (10 mg total) by mouth every 6 (six) hours as needed for nausea or vomiting. 01/30/22   Orson Slick, MD  senna-docusate (SENOKOT-S) 8.6-50 MG tablet Take 1 tablet by mouth at bedtime as needed for mild constipation. 11/26/21   [provider]  vitamin B-12 (CYANOCOBALAMIN) 500 MCG tablet Take 1 tablet by mouth daily. 07/19/15   [provider]      Allergies    Zithromax [azithromycin]    Review of Systems   Review of Systems   Constitutional:  Positive for fatigue and fever. Negative for chills.  HENT:  Negative for congestion and rhinorrhea.   Respiratory:  Negative for cough.   Cardiovascular:  Negative for chest pain.  Gastrointestinal:  Negative for diarrhea, nausea and vomiting.  Genitourinary:  Positive for dysuria.  Musculoskeletal:  Negative for back pain.  Skin:  Negative for rash.  Neurological:  Negative for headaches.  Psychiatric/Behavioral:  Negative for confusion.     Physical Exam Updated Vital Signs BP 117/72 (BP Location: Right Arm)   Pulse (!) 110   Temp (!) 102.8 F (39.3 C) (Oral)   Resp 18   Ht '6\' 4"'  (1.93 m)   Wt 98.9 kg   SpO2 97%   BMI 26.54 kg/m  Physical Exam Vitals and nursing note reviewed.  Constitutional:      General: He is not in acute distress.    Appearance: Normal appearance. He is well-developed.  HENT:     Head: Normocephalic and atraumatic.  Eyes:     Conjunctiva/sclera: Conjunctivae normal.  Cardiovascular:     Rate and Rhythm: Regular rhythm. Tachycardia present.     Heart sounds: No murmur heard. Pulmonary:     Effort: Pulmonary effort is normal. No respiratory distress.     Breath sounds: Normal breath sounds.  Abdominal:     Palpations: Abdomen is soft.     Tenderness: There is no abdominal tenderness. There is no guarding or rebound.  Musculoskeletal:     Cervical back: Neck supple.     Right lower leg: No edema.     Left lower leg: No edema.  Skin:    General: Skin is warm and dry.     Capillary Refill: Capillary refill takes less than 2 seconds.  Neurological:     General: No focal deficit present.     Mental Status: He is alert.     ED Results / Procedures / Treatments   Labs (all labs ordered are listed, but only abnormal results are displayed) Labs Reviewed  COMPREHENSIVE METABOLIC PANEL - Abnormal; Notable for the following components:      Result Value   Sodium 134 (*)    Potassium 3.4 (*)    CO2 18 (*)    Glucose, Bld 106  (*)    Creatinine, Ser 2.01 (*)    Calcium 7.9 (*)    GFR, Estimated 34 (*)    All other components within normal limits  CBC WITH DIFFERENTIAL/PLATELET - Abnormal; Notable for the following components:   RBC 2.44 (*)    Hemoglobin 8.6 (*)    HCT 25.5 (*)    MCV 104.5 (*)    MCH 35.2 (*)    Platelets 99 (*)    Lymphs Abs 0.3 (*)    All other components within  normal limits  URINALYSIS, ROUTINE W REFLEX MICROSCOPIC - Abnormal; Notable for the following components:   Hgb urine dipstick SMALL (*)    Protein, ur 30 (*)    All other components within normal limits  CBC WITH DIFFERENTIAL/PLATELET - Abnormal; Notable for the following components:   RBC 2.59 (*)    Hemoglobin 9.1 (*)    HCT 28.0 (*)    MCV 108.1 (*)    MCH 35.1 (*)    Platelets 98 (*)    Lymphs Abs 0.3 (*)    All other components within normal limits  COMPREHENSIVE METABOLIC PANEL - Abnormal; Notable for the following components:   CO2 18 (*)    Glucose, Bld 100 (*)    Creatinine, Ser 1.99 (*)    Calcium 7.9 (*)    Total Protein 6.4 (*)    Albumin 3.4 (*)    GFR, Estimated 34 (*)    All other components within normal limits  PHOSPHORUS - Abnormal; Notable for the following components:   Phosphorus 2.0 (*)    All other components within normal limits  CULTURE, BLOOD (ROUTINE X 2)  CULTURE, BLOOD (ROUTINE X 2)  LACTIC ACID, PLASMA  PROTIME-INR  MAGNESIUM  MAGNESIUM    EKG EKG Interpretation  Date/Time:  Sunday March 09 2022 22:37:03 EDT Ventricular Rate:  128 PR Interval:    QRS Duration: 82 QT Interval:  293 QTC Calculation: 369 R Axis:   -22 Text Interpretation: Atrial fibrillation intermittent IVCD LVH with secondary repolarization abnormality increased rate and atrial fib since prior yesterday Confirmed by Aletta Edouard [phone]) on 03/09/2022 10:42:36 PM  Radiology DG Chest Port 1 View  Result Date: 03/08/2022 CLINICAL DATA:  Questionable sepsis - evaluate for abnormality Fever.  Chemotherapy,  most recently Monday. EXAM: PORTABLE CHEST 1 VIEW COMPARISON:  Chest radiograph 03/12/2014 FINDINGS: The heart is normal in size. Streaky opacity at the right lung base is unchanged from prior exam and most consistent with scarring. No evidence of acute airspace disease. No pulmonary edema, pleural effusion, or pneumothorax. No acute osseous abnormalities are seen on this portable exam, patient with history of myeloma. IMPRESSION: No acute chest findings. Unchanged scarring at the right lung base. Electronically Signed   By: Keith Rake M.D.   On: 03/08/2022 23:45    Procedures .Critical Care  Performed by: Hayden Rasmussen, MD Authorized by: Hayden Rasmussen, MD   Critical care provider statement:    Critical care time (minutes):  45   Critical care time was exclusive of:  Separately billable procedures and treating other patients   Critical care was necessary to treat or prevent imminent or life-threatening deterioration of the following conditions:  Sepsis   Critical care was time spent personally by me on the following activities:  Development of treatment plan with patient or surrogate, discussions with consultants, evaluation of patient's response to treatment, examination of patient, obtaining history from patient or surrogate, ordering and performing treatments and interventions, ordering and review of laboratory studies, pulse oximetry, re-evaluation of patient's condition and review of old charts   I assumed direction of critical care for this patient from another provider in my specialty: no       Medications Ordered in ED Medications  levothyroxine (SYNTHROID) tablet 100 mcg (100 mcg Oral Given 03/10/22 0529)  metoprolol succinate (TOPROL-XL) 24 hr tablet 12.5 mg (12.5 mg Oral Given 03/10/22 0143)  enoxaparin (LOVENOX) injection 40 mg (40 mg Subcutaneous Given 03/10/22 0143)  ceFEPIme (MAXIPIME) 2 g in  sodium chloride 0.9 % 100 mL IVPB (0 g Intravenous Stopped 03/10/22 1046)   lactated ringers 1,000 mL with potassium chloride 40 mEq infusion ( Intravenous New Bag/Given 03/10/22 0145)  acetaminophen (TYLENOL) tablet 650 mg (650 mg Oral Given 03/10/22 0529)  oxyCODONE (Oxy IR/ROXICODONE) immediate release tablet 5 mg (has no administration in time range)  HYDROmorphone (DILAUDID) injection 0.5 mg (has no administration in time range)  polyethylene glycol (MIRALAX / GLYCOLAX) packet 17 g (has no administration in time range)  melatonin tablet 5 mg (has no administration in time range)  ondansetron (ZOFRAN) injection 4 mg (has no administration in time range)  Oral care mouth rinse (has no administration in time range)  acetaminophen (TYLENOL) tablet 650 mg (650 mg Oral Given 03/09/22 2106)  sodium chloride 0.9 % bolus 1,000 mL (1,000 mLs Intravenous New Bag/Given 03/09/22 2158)  ceFEPIme (MAXIPIME) 2 g in sodium chloride 0.9 % 100 mL IVPB (2 g Intravenous New Bag/Given 03/09/22 2159)    ED Course/ Medical Decision Making/ A&P Clinical Course as of 03/10/22 1154  Sun Mar 09, 2022  2143 Reviewed case with Dr. [name] oncology.  He is recommending admission for IV antibiotics. [MB]  2249 Patient's EKG showing atrial fibrillation.  He does have a history of A-fib although not on anticoagulation and was in normal sinus rhythm with a rate in the 60s during yesterday's visit.  Likely precipitated by patient's fever and possible infection.  We will hold off on rate control and see how he does with fever control and IV fluids.  Patient not having any chest pain. [MB]  2305 Discussed with Dr. Nevada Crane [facility] hospitalist who will evaluate patient for admission. [MB]    Clinical Course User Index [MB] Hayden Rasmussen, MD                           Medical Decision Making Amount and/or Complexity of Data Reviewed Labs: ordered. Radiology: ordered.  Risk OTC drugs. Decision regarding hospitalization.  This patient complains of fever; this involves an extensive number of  treatment Options and is a complaint that carries with it a high risk of complications and morbidity. The differential includes fever, sepsis, neutropenia, immune compromise, pneumonia, UTI, bacteremia  I ordered, reviewed and interpreted labs, which included CBC with normal white count, hemoglobin low stable from priors, chemistries with some low bicarb elevated creatinine stable from priors, urinalysis without signs of I ordered medication oral Tylenol IV fluids IV antibiotics and reviewed PMP when indicated. Additional history obtained from patient's wife Previous records obtained and reviewed in epic including discharge note from earlier this morning I consulted Dr. [name] oncology and Dr. Nevada Crane [facility] hospitalist and discussed lab and imaging findings and discussed disposition.  Cardiac monitoring reviewed, A-fib with RVR Social determinants considered, no significant barriers Critical Interventions: Work-up of patient's SIRS/sepsis with early IV fluids and antibiotics  After the interventions stated above, I reevaluated the patient and found patient's heart rate to be improving.  Status remains normal Admission and further testing considered, patient will require admission to the hospital for further management.  He and his wife are in agreement with plan.          Final Clinical Impression(s) / ED Diagnoses Final diagnoses:  Fever, unspecified fever cause  Multiple myeloma not having achieved remission (HCC)  Atrial fibrillation with RVR (HCC)  SIRS (systemic inflammatory response syndrome) ([place])    Rx / DC Orders ED Discharge Orders  None         Hayden Rasmussen, MD 03/10/22 1158

## 2022-03-09 NOTE — ED Notes (Signed)
Message sent to Dr. Melina Copa d/t pt appears to be in Afib, he is bouncing around from 90s-130s, no hx other then 2015 Afib RVR. Awaiting orders and response back for POC

## 2022-03-09 NOTE — ED Notes (Signed)
I provided reinforced discharge education based off of discharge instructions. Pt acknowledged and understood my education. Pt had no further questions/concerns for provider/myself.  °

## 2022-03-09 NOTE — ED Notes (Signed)
Secure message sent to receiving RN [facility] De[facility] , awaiting response. Pt has an inpatient bed at this time

## 2022-03-09 NOTE — H&P (Addendum)
History and Physical  [name] DOB: 09-07-1946 DOA: 03/09/2022  Referring physician: Dr. Melina Copa, [place]  PCP: [name], MD  Outpatient Specialists: Medical oncology Dr. [name]. Patient coming from: Home.  Chief Complaint: Recurrent fevers at home  HPI: Darren Hinton. is a 76 y.o. male with medical history significant for multiple myeloma on recent chemotherapy (03/03/22), paroxysmal A-fib not oral anticoagulated, essential hypertension, hyperlipidemia, hypothyroidism, mitral valve prolapse, hypothyroidism, chronic anxiety/depression who presented to [facility] ED from home with complaints of having recurrent subjective fevers with Tmax 102 at home.  Associated with mild dysuria.  Upon presentation to the ED, febrile with Tmax 102.8, no clear source of infection, UA negative for pyuria, electrolytes abnormalities and anemia of chronic disease.  Cultures were obtained and the patient was placed on antibiotics empirically Rocephin.  EDP discussed the case with medical oncology Dr. [name] who recommended admission for further work-up.  To note, the patient presented to the ED the day prior to presentation for the same, he was sent home with oral antibiotics for fever of unknown origin.    In the ED, febrile with Tmax 102.8.  Received 1 L IV fluid bolus normal saline, 1 dose cefepime, 1 dose 650 mg p.o. Tylenol.  TR[facility], hospitalist service, was asked to admit.  ED Course: Tmax 102.8.  BP 107/80, pulse 113, respiratory rate 21, saturation 99% on room air.  Lab studies significant for WBC 4.3, hemoglobin 8.6, baseline 9.  MCV 104.  Platelet count 99.  Review of Systems: Review of systems as noted in the HPI. All other systems reviewed and are negative.   Past Medical History:  Diagnosis Date   Allergy    Anxiety    Arthritis    Depression    Heart murmur    Hyperlipidemia    Hyperplastic colon polyp    Hyperproteinemia    Hypertension    Hypothyroidism     Mitral valve prolapse    Paroxysmal atrial fibrillation (HCC)    Thyroid disease    History reviewed. No pertinent surgical history.  Social History:  reports that he quit smoking about 43 years ago. His smoking use included cigarettes. He has never used smokeless tobacco. He reports current alcohol use of about 2.0 standard drinks of alcohol per week. He reports that he does not use drugs.   Allergies  Allergen Reactions   Zithromax [Azithromycin] Other (See Comments)    Family History  Problem Relation Age of Onset   Cancer Father    Colon cancer Neg Hx    Esophageal cancer Neg Hx    Liver cancer Neg Hx    Pancreatic cancer Neg Hx    Rectal cancer Neg Hx    Stomach cancer Neg Hx       Prior to Admission medications   Medication Sig Start Date End Date Taking? Authorizing Provider  cefdinir (OMNICEF) 300 MG capsule Take 1 capsule (300 mg total) by mouth 2 (two) times daily. 03/09/22  Yes Pollina, Gwenyth Allegra, MD  acyclovir (ZOVIRAX) 400 MG tablet Take 1 tablet (400 mg total) by mouth 2 (two) times daily. 01/30/22   Orson Slick, MD  allopurinol (ZYLOPRIM) 300 MG tablet Take 1 tablet (300 mg total) by mouth daily. 01/30/22   Orson Slick, MD  ALPRAZolam Duanne Moron) 0.25 MG tablet Take 0.25 mg by mouth daily as needed. Anxiety     [name], MD  amLODipine (NORVASC) 5 MG tablet Take 5 mg  by mouth daily. 10/24/14   [provider]  atorvastatin (LIPITOR) 20 MG tablet Take 20 mg by mouth daily.    [provider]  dexamethasone (DECADRON) 4 MG tablet Take 10 tablets (40 mg total) by mouth once a week. Take steroids pills the in the morning on chemotherapy days. 01/30/22   Orson Slick, MD  ergocalciferol (VITAMIN D2) 1.25 MG (50000 UT) capsule Take 50,000 Units by mouth every Wednesday. 10/27/20   [provider]  fluticasone (FLONASE) 50 MCG/ACT nasal spray Place 2 sprays into the nose daily as needed. Allergies     [provider]  levothyroxine (SYNTHROID) 100 MCG tablet Take 100 mcg by mouth daily before breakfast. 10/14/21   [provider]  loratadine (CLARITIN) 10 MG tablet Take 10 mg by mouth daily. 01/04/20   [provider]  losartan-hydrochlorothiazide (HYZAAR) 50-12.5 MG tablet Take 1 tablet by mouth daily. 01/31/22   [provider]  meclizine (ANTIVERT) 25 MG tablet Take 1 tablet (25 mg total) by mouth 3 (three) times daily as needed for dizziness. 03/20/13   Shawnee Knapp, MD  metoprolol succinate (TOPROL-XL) 100 MG 24 hr tablet Take 100 mg by mouth daily. Take with or immediately following a meal.    [provider]  ondansetron (ZOFRAN) 8 MG tablet Take 1 tablet (8 mg total) by mouth every 8 (eight) hours as needed. 01/30/22   Orson Slick, MD  prochlorperazine (COMPAZINE) 10 MG tablet Take 1 tablet (10 mg total) by mouth every 6 (six) hours as needed for nausea or vomiting. 01/30/22   Orson Slick, MD  senna-docusate (SENOKOT-S) 8.6-50 MG tablet Take 1 tablet by mouth at bedtime as needed for mild constipation. 11/26/21   [provider]  vitamin B-12 (CYANOCOBALAMIN) 500 MCG tablet Take 1 tablet by mouth daily. 07/19/15   [provider]    Physical Exam: BP 107/80   Pulse (!) 113   Temp 100 F (37.8 C) (Oral)   Resp (!) 21   Ht _0  (1.93 m)   Wt 98.9 kg   SpO2 99%   BMI 26.54 kg/m   General: 76 y.o. year-old male well developed well nourished in no acute distress.  Alert and oriented x3. Cardiovascular: Irregular rate and rhythm with no rubs or gallops.  No thyromegaly or JVD noted.  No lower extremity edema. 2/4 pulses in all 4 extremities. Respiratory: Clear to auscultation with no wheezes or rales. Good inspiratory effort. Abdomen: Soft nontender nondistended with normal bowel sounds x4 quadrants. Muskuloskeletal: No cyanosis, clubbing or edema noted bilaterally Neuro: CN II-XII intact, strength, sensation, reflexes Skin: No  ulcerative lesions noted or rashes Psychiatry: Judgement and insight appear normal. Mood is appropriate for condition and setting          Labs on Admission:  Basic Metabolic Panel: Recent Labs  Lab 03/03/22 1410 03/08/22 2246 03/09/22 2037  NA 139 139 134*  K 4.3 3.6 3.4*  CL 113* 113* 108  CO2 19* 18* 18*  GLUCOSE 123* 140* 106*  BUN 24* 20 19  CREATININE 1.89* 1.98* 2.01*  CALCIUM 8.8* 8.7* 7.9*  MG  --   --  2.2   Liver Function Tests: Recent Labs  Lab 03/03/22 1410 03/08/22 2246 03/09/22 2037  AST 14* 17 25  ALT _1 ALKPHOS 120 95 76  BILITOT 0.4 1.1 1.0  PROT 7.2 6.9 6.7  ALBUMIN 4.2 3.8 3.6   No results  for input(s): "LIPASE", "AMYLASE" in the last 168 hours. No results for input(s): "AMMONIA" in the last 168 hours. CBC: Recent Labs  Lab 03/03/22 1410 03/08/22 2246 03/09/22 2037  WBC 4.2 3.6* 4.3  NEUTROABS 3.7 2.9 3.6  HGB 8.4* 9.2* 8.6*  HCT 25.6* 28.3* 25.5*  MCV 107.1* 106.4* 104.5*  PLT 206 124* 99*   Cardiac Enzymes: No results for input(s): "CKTOTAL", "CKMB", "CKMBINDEX", "TROPONINI" in the last 168 hours.  BNP (last 3 results) No results for input(s): "BNP" in the last 8760 hours.  ProBNP (last 3 results) No results for input(s): "PROBNP" in the last 8760 hours.  CBG: No results for input(s): "GLUCAP" in the last 168 hours.  Radiological Exams on Admission: DG Chest Port 1 View  Result Date: 03/08/2022 CLINICAL DATA:  Questionable sepsis - evaluate for abnormality Fever.  Chemotherapy, most recently Monday. EXAM: PORTABLE CHEST 1 VIEW COMPARISON:  Chest radiograph 03/12/2014 FINDINGS: The heart is normal in size. Streaky opacity at the right lung base is unchanged from prior exam and most consistent with scarring. No evidence of acute airspace disease. No pulmonary edema, pleural effusion, or pneumothorax. No acute osseous abnormalities are seen on this portable exam, patient with history of myeloma. IMPRESSION: No acute chest  findings. Unchanged scarring at the right lung base. Electronically Signed   By: Keith Rake M.D.   On: 03/08/2022 23:45    EKG: I independently viewed the EKG done and my findings are as followed: Atrial fibrillation, rate of 128.  Nonspecific ST-T changes.  QTc 369.  Assessment/Plan Present on Admission:  Fever  Principal Problem:   Fever  Fever of unknown origin Tmax 102.8 in the ED UA negative for pyuria Chest x-ray done on 03/08/2022 nonacute. Follow blood cultures and urine culture No leukocytosis Follow fever curve and WBC  Paroxysmal A-fib with RVR Presented with rates in the 120s CHA2DS2-VASc score of 3 Not on oral anticoagulation prior to admission Resume home Toprol-XL Closely monitor on telemetry  Hypovolemic hyponatremia Serum sodium 134 Encourage oral intake as tolerated LR KCl 40 mEq 50 cc/h x 1 day. Repeat chemistry panel antibody  Hypokalemia Serum potassium 3.4 Repleted intravenously Obtain magnesium level in the morning  Mild non anion gap metabolic acidosis Serum bicarb 16, anion gap 8  IV fluid hydration Repeat labs in the morning  CKD 3B Appears to be close to his baseline creatinine 2.01 with GFR of 34. Avoid nephrotoxic agents, dehydration and hypotension. Monitor urine output Repeat renal panel in the morning.  Anemia of chronic disease Likely contributed by chemotherapy Monitor H&H No overt bleeding Transfusion if hemoglobin less than 8.0.  Multiple myeloma with recent chemotherapy (03/03/22) EDP discussed the case with medical oncology on-call Dr. [name] who recommended admission for further work-up. Officially consult medical oncology in the morning.  Hypothyroidism Resume home levothyroxine   DVT prophylaxis: Subcu Lovenox daily  Code Status: Full code  Family Communication: His wife at bedside  Disposition Plan: Admitted to telemetry unit.  Consults called: None.  Consult medical oncology in the  morning.  Admission status:  Inpatient status.   Status is: Inpatient The patient requires at least 2 midnights for further evaluation and treatment of present condition.   Kayleen Memos MD [facility] Pager [phone]  If 7PM-7AM, please contact night-coverage [email or web address] Password [email or web address]  03/09/2022, 11:11 PM

## 2022-03-09 NOTE — ED Notes (Signed)
Pt has ready inpatient bed 1442, waiting for a nurse to be assigned to pt on unit for report handoff

## 2022-03-09 NOTE — ED Triage Notes (Signed)
Patient just discharged this morning at 3am. Has Multiple Myeloma cancer, does not know what stage. But family checked his temperature before coming and it was 102.20F. Wife said he felt warm, so she checked his temperature. Did not take any medications before arrival.

## 2022-03-09 NOTE — Progress Notes (Signed)
PHARMACY NOTE:  ANTIMICROBIAL RENAL DOSAGE ADJUSTMENT  Current antimicrobial regimen includes a mismatch between antimicrobial dosage and estimated renal function.  As per policy approved by the Pharmacy & Therapeutics and Medical Executive Committees, the antimicrobial dosage will be adjusted accordingly.  Current antimicrobial dosage:  cefepime 2 gm q24  Indication: febrile neutropenia  Renal Function:  Estimated Creatinine Clearance: 38.4 mL/min (A) (by C-G formula based on SCr of 2.01 mg/dL (H)). '[]'$      On intermittent HD, scheduled: '[]'$      On CRRT    Antimicrobial dosage has been changed to:  cefepime 2 gm q12  Thank you for allowing pharmacy to be a part of this patient's care.  Eudelia Bunch, Pharm.D 03/09/2022 11:42 PM

## 2022-03-10 ENCOUNTER — Inpatient Hospital Stay: Payer: No Typology Code available for payment source

## 2022-03-10 ENCOUNTER — Inpatient Hospital Stay: Payer: No Typology Code available for payment source | Admitting: Physician Assistant

## 2022-03-10 ENCOUNTER — Inpatient Hospital Stay: Payer: No Typology Code available for payment source | Admitting: Hematology and Oncology

## 2022-03-10 DIAGNOSIS — I480 Paroxysmal atrial fibrillation: Secondary | ICD-10-CM | POA: Diagnosis not present

## 2022-03-10 DIAGNOSIS — R509 Fever, unspecified: Secondary | ICD-10-CM | POA: Diagnosis not present

## 2022-03-10 DIAGNOSIS — C9000 Multiple myeloma not having achieved remission: Secondary | ICD-10-CM

## 2022-03-10 LAB — COMPREHENSIVE METABOLIC PANEL
ALT: 18 U/L (ref 0–44)
AST: 30 U/L (ref 15–41)
Albumin: 3.4 g/dL — ABNORMAL LOW (ref 3.5–5.0)
Alkaline Phosphatase: 73 U/L (ref 38–126)
Anion gap: 8 (ref 5–15)
BUN: 20 mg/dL (ref 8–23)
CO2: 18 mmol/L — ABNORMAL LOW (ref 22–32)
Calcium: 7.9 mg/dL — ABNORMAL LOW (ref 8.9–10.3)
Chloride: 110 mmol/L (ref 98–111)
Creatinine, Ser: 1.99 mg/dL — ABNORMAL HIGH (ref 0.61–1.24)
GFR, Estimated: 34 mL/min — ABNORMAL LOW (ref >60)
Glucose, Bld: 100 mg/dL — ABNORMAL HIGH (ref 70–99)
Potassium: 3.6 mmol/L (ref 3.5–5.1)
Sodium: 136 mmol/L (ref 135–145)
Total Bilirubin: 1 mg/dL (ref 0.3–1.2)
Total Protein: 6.4 g/dL — ABNORMAL LOW (ref 6.5–8.1)

## 2022-03-10 LAB — CBC WITH DIFFERENTIAL/PLATELET
Abs Immature Granulocytes: 0.05 10*3/uL (ref 0.00–0.07)
Basophils Absolute: 0 10*3/uL (ref 0.0–0.1)
Basophils Relative: 0 %
Eosinophils Absolute: 0 10*3/uL (ref 0.0–0.5)
Eosinophils Relative: 0 %
HCT: 28 % — ABNORMAL LOW (ref 39.0–52.0)
Hemoglobin: 9.1 g/dL — ABNORMAL LOW (ref 13.0–17.0)
Immature Granulocytes: 1 %
Lymphocytes Relative: 6 %
Lymphs Abs: 0.3 10*3/uL — ABNORMAL LOW (ref 0.7–4.0)
MCH: 35.1 pg — ABNORMAL HIGH (ref 26.0–34.0)
MCHC: 32.5 g/dL (ref 30.0–36.0)
MCV: 108.1 fL — ABNORMAL HIGH (ref 80.0–100.0)
Monocytes Absolute: 0.6 10*3/uL (ref 0.1–1.0)
Monocytes Relative: 10 %
Neutro Abs: 4.8 10*3/uL (ref 1.7–7.7)
Neutrophils Relative %: 83 %
Platelets: 98 10*3/uL — ABNORMAL LOW (ref 150–400)
RBC: 2.59 MIL/uL — ABNORMAL LOW (ref 4.22–5.81)
RDW: 14.5 % (ref 11.5–15.5)
WBC: 5.7 10*3/uL (ref 4.0–10.5)
nRBC: 0 % (ref 0.0–0.2)

## 2022-03-10 LAB — URINALYSIS, ROUTINE W REFLEX MICROSCOPIC
Bacteria, UA: NONE SEEN
Bilirubin Urine: NEGATIVE
Glucose, UA: NEGATIVE mg/dL
Ketones, ur: NEGATIVE mg/dL
Leukocytes,Ua: NEGATIVE
Nitrite: NEGATIVE
Protein, ur: 30 mg/dL — AB
Specific Gravity, Urine: 1.01 (ref 1.005–1.030)
pH: 6 (ref 5.0–8.0)

## 2022-03-10 LAB — MAGNESIUM: Magnesium: 1.8 mg/dL (ref 1.7–2.4)

## 2022-03-10 LAB — PHOSPHORUS: Phosphorus: 2 mg/dL — ABNORMAL LOW (ref 2.5–4.6)

## 2022-03-10 LAB — URINE CULTURE: Culture: <10000 — AB

## 2022-03-10 LAB — MRSA NEXT GEN BY PCR, NASAL: MRSA by PCR Next Gen: NOT DETECTED

## 2022-03-10 MED ORDER — MAGNESIUM SULFATE 2 GM/50ML IV SOLN
2.0000 g | Freq: Once | INTRAVENOUS | Status: AC
  Administered 2022-03-10: 2 g via INTRAVENOUS
  Filled 2022-03-10: qty 50

## 2022-03-10 MED ORDER — SODIUM CHLORIDE 0.9 % IV BOLUS
500.0000 mL | Freq: Once | INTRAVENOUS | Status: AC
  Administered 2022-03-10: 500 mL via INTRAVENOUS

## 2022-03-10 MED ORDER — ACETAMINOPHEN 325 MG PO TABS
650.0000 mg | ORAL_TABLET | ORAL | Status: DC | PRN
  Administered 2022-03-10 – 2022-03-13 (×7): 650 mg via ORAL
  Filled 2022-03-10 (×7): qty 2

## 2022-03-10 MED ORDER — ORAL CARE MOUTH RINSE: 15.0000 mL | OROMUCOSAL | Status: DC | PRN

## 2022-03-10 MED ORDER — POTASSIUM PHOSPHATES 15 MMOLE/5ML IV SOLN
20.0000 mmol | Freq: Once | INTRAVENOUS | Status: AC
  Administered 2022-03-10: 20 mmol via INTRAVENOUS
  Filled 2022-03-10: qty 6.67

## 2022-03-10 NOTE — Progress Notes (Signed)
PROGRESS NOTE    [name].  IWL:[id number] DOB: 1946-02-20 DOA: 03/09/2022 PCP: [name], MD   Brief Narrative:  HPI per Dr. [name] on 03/09/22  Darren Hinton. is a 76 y.o. male with medical history significant for multiple myeloma on recent chemotherapy (03/03/22), paroxysmal A-fib not oral anticoagulated, essential hypertension, hyperlipidemia, hypothyroidism, mitral valve prolapse, hypothyroidism, chronic anxiety/depression who presented to [facility] ED from home with complaints of having recurrent subjective fevers with Tmax 102 at home.  Associated with mild dysuria.  Upon presentation to the ED, febrile with Tmax 102.8, no clear source of infection, UA negative for pyuria, electrolytes abnormalities and anemia of chronic disease.  Cultures were obtained and the patient was placed on antibiotics empirically Rocephin.  EDP discussed the case with medical oncology Dr. [name] who recommended admission for further work-up.   To note, the patient presented to the ED the day prior to presentation for the same, he was sent home with oral antibiotics for fever of unknown origin.     In the ED, febrile with Tmax 102.8.  Received 1 L IV fluid bolus normal saline, 1 dose cefepime, 1 dose 650 mg p.o. Tylenol.  TRH, hospitalist service, was asked to admit.   ED Course: Tmax 102.8.  BP 107/80, pulse 113, respiratory rate 21, saturation 99% on room air.  Lab studies significant for WBC 4.3, hemoglobin 8.6, baseline 9.  MCV 104.  Platelet count 99.   **Interim History  Patient continues to spike temperatures of unclear etiology.  He denies any chest pain, shortness of breath, burning or discomfort in his urine and no abdominal discomfort.  We will continue broad-spectrum antibiotics with IV cefepime but may need to add IV vancomycin however given his MRSA PCR being negative we will hold off at this time.  We will notify his primary medical oncologist of his admission.   Assessment  and Plan:  Fever of Unclear Etiology -Tmax 102.8 in the ED and he continues to spike temperatures -UA negative for pyuria denies any urinary discomfort; urinalysis showed clear appearance with yellow color urine, negative glucose, small hemoglobin, negative ketones, negative leukocytes, negative nitrites, no bacteria seen -Chest x-ray done on 03/08/2022 nonacute. Follow blood cultures and urine culture and blood cultures were repeated again so they are still pending and showing no growth -No leukocytosis -Follow fever curve and WBC -Fever likely in the setting of chemotherapy and will need to notify his primary oncologist and I have added Dr. [name] to the team and will call in the morning -Continue supportive care and continue with ice packs and give a 500 mL bolus and continue with IV fluid hydration at 50 MLS per hour -Check lower extremity duplex for DVTs as cause of fever -Continue with acetaminophen and avoid ibuprofen; acetaminophen was increased to 650 mg every 4 as needed for fever -Follow-up on cultures and discussed with oncology   Paroxysmal A-fib with RVR -Presented with rates in the 120s -CHA2DS2-VASc score of 3 -Not on oral anticoagulation prior to admission -Resume home Toprol-XL -Closely monitor on telemetry but heart rate is improved but still slightly tachycardic   Hypovolemic hyponatremia -Serum sodium 134 on admission and is now 136 -Encourage oral intake as tolerated -LR KCl 40 mEq 50 cc/h x 1 day. -His hyponatremia has improved we will continue to monitor and trend   Hypokalemia Hypophosphatemia -Potassium was 3.4 on admission and improved to 3.6 -Repleted intravenously -Phos level is 2.0 so we will replete Phos  level with IV. K-Phos 20 mmol -Continue monitoring replete as necessary -Repeat CMP in a.m.   Mild non anion gap metabolic acidosis -Serum bicarb 16, anion gap 8 on admission and now patient CO2 is 18, anion gap is 8, chloride level is 110  -Continue  with IV fluid hydration -Continue monitor and trend and repeat CMP in the a.m.   CKD 3B -Appears to be close to his baseline creatinine 2.01 with GFR of 34 on admission. -Patient's BUNs/creatinine is now 20/1.99 -Avoid nephrotoxic medications, contrast dyes, hypotension and dehydration to ensure adequate renal perfusion and renally adjust medications -Continue monitor I's and O's -Repeat CMP in the a.m.   Anemia of chronic disease/macrocytic anemia -Likely contributed by chemotherapy -Patient's hemoglobin/hematocrit went from 8.6/25.5 and is now improved slightly to 9.1/28.0 with an MCV of 108.1 -Continue monitor trend and repeat CMP in the a.m.   Multiple myeloma with recent chemotherapy (03/03/22) -EDP discussed the case with medical oncology on-call Dr. [name] who recommended admission for further work-up. -We will add patient's primary oncologist and discuss with them in the morning   Hypothyroidism -Resume home levothyroxine   Thrombocytopenia -Mild and patient's platelet count went from 99 is now 98 -Continue to monitor for signs and symptoms bleeding; no overt bleeding noted -Repeat CBC in a.m.  DVT prophylaxis: enoxaparin (LOVENOX) injection 40 mg Start: 03/09/22 2315 SCDs Start: 03/09/22 2311    Code Status: Full Code Family Communication: Discussed with his wife and friend at bedside  Disposition Plan:  Level of care: Telemetry Status is: Inpatient Remains inpatient appropriate because: Continues to spike temperatures of unclear etiology   Consultants:  We will notify his primary medical oncologist and if he continues to spike temperatures of unclear etiology may need infectious disease work-up and consultation  Procedures:  None  Antimicrobials:  Anti-infectives (From admission, onward)    Start     Dose/Rate Route Frequency Ordered Stop   03/10/22 1000  ceFEPIme (MAXIPIME) 2 g in sodium chloride 0.9 % 100 mL IVPB        2 g 200 mL/hr over 30 Minutes  Intravenous Every 12 hours 03/09/22 2335     03/09/22 2200  ceFEPIme (MAXIPIME) 2 g in sodium chloride 0.9 % 100 mL IVPB        2 g 200 mL/hr over 30 Minutes Intravenous  Once 03/09/22 2147 03/09/22 2229       Subjective: Seen and examined at bedside and states that he is feeling good and denies any chest pain, shortness of breath, nausea, vomiting, abdominal pain, burning or discomfort in his urine.  Denies no real complaints but continues to spike temperatures.  No other concerns or complaints at this time and feels okay  Objective: Vitals:   03/10/22 1244 03/10/22 1648 03/10/22 1749 03/10/22 1837  BP:      Pulse:      Resp:      Temp: 100.3 F (37.9 C) (!) 100.8 F (38.2 C) (!) 100.5 F (38.1 C) 100.3 F (37.9 C)  TempSrc: Oral Oral Oral   SpO2:      Weight:      Height:        Intake/Output Summary (Last 24 hours) at 03/10/2022 1859 Last data filed at 03/10/2022 1806 Gross per 24 hour  Intake 1537.23 ml  Output 400 ml  Net 1137.23 ml   Filed Weights   03/09/22 2029 03/10/22 0020  Weight: 98.9 kg 98.7 kg   Examination: Physical Exam:  Constitutional: WN/WD slightly overweight  African-American male currently no acute distress appears calm Respiratory: Diminished to auscultation bilaterally, no wheezing, rales, rhonchi or crackles. Normal respiratory effort and patient is not tachypenic. No accessory muscle use.  Unlabored breathing Cardiovascular: RRR, no murmurs / rubs / gallops. S1 and S2 auscultated. No extremity edema.   Abdomen: Soft, non-tender, very slightly distended. Bowel sounds positive.  GU: Deferred. Musculoskeletal: No clubbing / cyanosis of digits/nails. No joint deformity upper and lower extremities. Skin: Has some skin discoloration on his left leg that is chronic from the past Neurologic: CN 2-12 grossly intact with no focal deficits.  Romberg sign and cerebellar reflexes not assessed.  Psychiatric: Normal judgment and insight. Alert and oriented x 3.  Normal mood and appropriate affect.   Data Reviewed: I have personally reviewed following labs and imaging studies  CBC: Recent Labs  Lab 03/08/22 2246 03/09/22 2037 03/10/22 0401  WBC 3.6* 4.3 5.7  NEUTROABS 2.9 3.6 4.8  HGB 9.2* 8.6* 9.1*  HCT 28.3* 25.5* 28.0*  MCV 106.4* 104.5* 108.1*  PLT 124* 99* 98*   Basic Metabolic Panel: Recent Labs  Lab 03/08/22 2246 03/09/22 2037 03/10/22 0401  NA 139 134* 136  K 3.6 3.4* 3.6  CL 113* 108 110  CO2 18* 18* 18*  GLUCOSE 140* 106* 100*  BUN _0 CREATININE 1.98* 2.01* 1.99*  CALCIUM 8.7* 7.9* 7.9*  MG  --  2.2 1.8  PHOS  --   --  2.0*   GFR: Estimated Creatinine Clearance: 39.8 mL/min (A) (by C-G formula based on SCr of 1.99 mg/dL (H)). Liver Function Tests: Recent Labs  Lab 03/08/22 2246 03/09/22 2037 03/10/22 0401  AST _1 ALT _2 ALKPHOS 95 76 73  BILITOT 1.1 1.0 1.0  PROT 6.9 6.7 6.4*  ALBUMIN 3.8 3.6 3.4*   No results for input(s): "LIPASE", "AMYLASE" in the last 168 hours. No results for input(s): "AMMONIA" in the last 168 hours. Coagulation Profile: Recent Labs  Lab 03/08/22 2314 03/09/22 2037  INR 1.1 1.1   Cardiac Enzymes: No results for input(s): "CKTOTAL", "CKMB", "CKMBINDEX", "TROPONINI" in the last 168 hours. BNP (last 3 results) No results for input(s): "PROBNP" in the last 8760 hours. HbA1C: No results for input(s): "HGBA1C" in the last 72 hours. CBG: No results for input(s): "GLUCAP" in the last 168 hours. Lipid Profile: No results for input(s): "CHOL", "HDL", "LDLCALC", "TRIG", "CHOLHDL", "LDLDIRECT" in the last 72 hours. Thyroid Function Tests: No results for input(s): "TSH", "T4TOTAL", "FREET4", "T3FREE", "THYROIDAB" in the last 72 hours. Anemia Panel: No results for input(s): "VITAMINB12", "FOLATE", "FERRITIN", "TIBC", "IRON", "RETICCTPCT" in the last 72 hours. Sepsis Labs: Recent Labs  Lab 03/08/22 2246 03/09/22 2037  LATICACIDVEN 1.2 0.9    Recent Results  (from the past 240 hour(s))  SARS Coronavirus 2 by RT PCR (hospital order, performed in [facility] hospital lab) *cepheid single result test* Anterior Nasal Swab     Status: None   Collection Time: 03/08/22 11:13 PM   Specimen: Anterior Nasal Swab  Result Value Ref Range Status   SARS Coronavirus 2 by RT PCR NEGATIVE NEGATIVE Final    Comment: (NOTE) SARS-CoV-2 target nucleic acids are NOT DETECTED.  The SARS-CoV-2 RNA is generally detectable in upper and lower respiratory specimens during the acute phase of infection. The lowest concentration of SARS-CoV-2 viral copies this assay can detect is 250 copies / mL. A negative result does not preclude SARS-CoV-2 infection and should not be used as the  sole basis for treatment or other patient management decisions.  A negative result may occur with improper specimen collection / handling, submission of specimen other than nasopharyngeal swab, presence of viral mutation(s) within the areas targeted by this assay, and inadequate number of viral copies (<250 copies / mL). A negative result must be combined with clinical observations, patient history, and epidemiological information.  Fact Sheet for Patients:   https://www.patel.info/  Fact Sheet for Healthcare Providers: https://hall.com/  This test is not yet approved or  cleared by the [place] FDA and has been authorized for detection and/or diagnosis of SARS-CoV-2 by FDA under an Emergency Use Authorization (EUA).  This EUA will remain in effect (meaning this test can be used) for the duration of the COVID-19 declaration under Section 564(b)(1) of the Act, 21 U.S.C. section 360bbb-3(b)(1), unless the authorization is terminated or revoked sooner.  Performed at [facility], [place] [address], [place], [place] [address]   Blood culture (routine x 2)     Status: None (Preliminary result)   Collection Time: 03/08/22 11:14  PM   Specimen: BLOOD  Result Value Ref Range Status   Specimen Description   Final    BLOOD Performed at [facility] [address], [place], [place] [address]    Special Requests   Final    BOTTLES DRAWN AEROBIC AND ANAEROBIC Blood Culture adequate volume Performed at [place] [address] [place], [place] [address]    Culture   Final    NO GROWTH 1 DAY Performed at [facility], [place] [address], [place], [place] [address]    Report Status PENDING  Incomplete  Blood culture (routine x 2)     Status: None (Preliminary result)   Collection Time: 03/08/22 11:50 PM   Specimen: BLOOD  Result Value Ref Range Status   Specimen Description   Final    BLOOD LEFT ANTECUBITAL Performed at [facility] [address] [place], [place] [address]    Special Requests   Final    BOTTLES DRAWN AEROBIC AND ANAEROBIC Blood Culture results may not be optimal due to an excessive volume of blood received in culture bottles Performed at [place] [address] [place], [place] [address]    Culture   Final    NO GROWTH 1 DAY Performed at [facility], [place] [address], [place], [place] [address]    Report Status PENDING  Incomplete  Urine Culture     Status: Abnormal   Collection Time: 03/09/22 12:04 AM   Specimen: Urine, Clean Catch  Result Value Ref Range Status   Specimen Description   Final    URINE, CLEAN CATCH Performed at [facility], [place] [address] [place], [place] [address]    Special Requests   Final    NONE Performed at [facility], [place] [address], [place], [place] [address]    Culture (A)  Final    <10,000 COLONIES/mL INSIGNIFICANT GROWTH Performed at [place] [address] [place], [place] [address]    Report Status 03/10/2022 FINAL  Final  MRSA Next Gen by PCR, Nasal     Status: None   Collection Time: 03/10/22 12:17 PM    Specimen: Nasal Mucosa; Nasal Swab  Result Value Ref Range Status   MRSA by PCR Next Gen NOT DETECTED NOT DETECTED Final    Comment: (NOTE) The GeneXpert MRSA Assay (FDA approved for  NASAL specimens only), is one component of a comprehensive MRSA colonization surveillance program. It is not intended to diagnose MRSA infection nor to guide or monitor treatment for MRSA infections. Test performance is not FDA approved in patients less than 63 years old. Performed at [facility], [place] [address] [place], [place] [address]     Radiology Studies: [facility] Chest Port 1 View  Result Date: 03/08/2022 CLINICAL DATA:  Questionable sepsis - evaluate for abnormality Fever.  Chemotherapy, most recently Monday. EXAM: PORTABLE CHEST 1 VIEW COMPARISON:  Chest radiograph 03/12/2014 FINDINGS: The heart is normal in size. Streaky opacity at the right lung base is unchanged from prior exam and most consistent with scarring. No evidence of acute airspace disease. No pulmonary edema, pleural effusion, or pneumothorax. No acute osseous abnormalities are seen on this portable exam, patient with history of myeloma. IMPRESSION: No acute chest findings. Unchanged scarring at the right lung base. Electronically Signed   By: Keith Rake M.D.   On: 03/08/2022 23:45    Scheduled Meds:  enoxaparin (LOVENOX) injection  40 mg Subcutaneous QHS   levothyroxine  100 mcg Oral QAC breakfast   metoprolol succinate  12.5 mg Oral Daily   Continuous Infusions:  ceFEPime (MAXIPIME) IV Stopped (03/10/22 1046)   lactated ringers 1,000 mL with potassium chloride 40 mEq infusion 50 mL/hr at 03/10/22 0145     LOS: 1 day   Raiford Noble, DO [facility] Available via Epic secure chat 7am-7pm After these hours, please refer to coverage provider listed on [email or web address] 03/10/2022, 6:59 PM

## 2022-03-10 NOTE — Progress Notes (Signed)
   03/10/22 0514  Assess: MEWS Score  Temp (!) 101 F (38.3 C)  BP 124/74  MAP (mmHg) 87  Pulse Rate (!) 112  Resp 17  Level of Consciousness Alert  SpO2 95 %  O2 Device Room Air  Assess: MEWS Score  MEWS Temp 1  MEWS Systolic 0  MEWS Pulse 2  MEWS RR 0  MEWS LOC 0  MEWS Score 3  MEWS Score Color Yellow  Assess: if the MEWS score is Yellow or Red  Were vital signs taken at a resting state? Yes  Focused Assessment No change from prior assessment  Does the patient meet 2 or more of the SIRS criteria? Yes  Does the patient have a confirmed or suspected source of infection? No  MEWS guidelines implemented *See Row Information* Yes  Treat  MEWS Interventions Administered prn meds/treatments  Pain Scale 0-10  Pain Score 0  Take Vital Signs  Increase Vital Sign Frequency  Yellow: Q 2hr X 2 then Q 4hr X 2, if remains yellow, continue Q 4hrs  Escalate  MEWS: Escalate Yellow: discuss with charge nurse/RN and consider discussing with provider and RRT  Notify: Charge Nurse/RN  Name of Charge Nurse/RN Notified Kristine, RN  Date Charge Nurse/RN Notified 03/10/22  Time Charge Nurse/RN Notified [phone]  Document  Patient Outcome Stabilized after interventions  Assess: SIRS CRITERIA  SIRS Temperature  1  SIRS Pulse 1  SIRS Respirations  0  SIRS WBC 0  SIRS Score Sum  2

## 2022-03-10 NOTE — TOC Initial Note (Signed)
Transition of Care (TOC) - Initial/Assessment Note    Patient Details  Name: Darren Hinton. MRN: [id number] Date of Birth: May 01, 1946  Transition of Care [facility]) CM/SW Contact:    Leeroy Cha, RN Phone Number: 03/10/2022, 9:03 AM  Clinical Narrative:                  Transition of Care [facility]) Screening Note   Patient Details  Name: Darren Hinton. Date of Birth: [birth year]   Transition of Care [facility]) CM/SW Contact:    Leeroy Cha, RN Phone Number: 03/10/2022, 9:03 AM    Transition of Care Department [facility]) has reviewed patient and no TOC needs have been identified at this time. We will continue to monitor patient advancement through interdisciplinary progression rounds. If new patient transition needs arise, please place a TOC consult.    Expected Discharge Plan: Home/Self Care Barriers to Discharge: Continued Medical Work up   Patient Goals and CMS Choice Patient states their goals for this hospitalization and ongoing recovery are:: to return home CMS Medicare.gov Compare Post Acute Care list provided to:: Patient    Expected Discharge Plan and Services Expected Discharge Plan: Home/Self Care   Discharge Planning Services: CM Consult   Living arrangements for the past 2 months: Single Family Home                                      Prior Living Arrangements/Services Living arrangements for the past 2 months: Single Family Home Lives with:: Spouse Patient language and need for interpreter reviewed:: Yes Do you feel safe going back to the place where you live?: Yes            Criminal Activity/Legal Involvement Pertinent to Current Situation/Hospitalization: No - Comment as needed  Activities of Daily Living      Permission Sought/Granted                  Emotional Assessment Appearance:: Appears stated age     Orientation: : Oriented to Place, Oriented to Self, Oriented to  Time, Oriented to Situation Alcohol /  Substance Use: Not Applicable Psych Involvement: No (comment)  Admission diagnosis:  Fever [R50.9] Multiple myeloma not having achieved remission ([place]) [C90.00] Atrial fibrillation with RVR ([place]) [I48.91] Fever, unspecified fever cause [R50.9] Patient Active Problem List   Diagnosis Date Noted   Fever 03/09/2022   Multiple myeloma not having achieved remission ([place]) 01/30/2022   Deficiency anemia 10/26/2014   Multiple myeloma ([place]) 04/06/2012   PCP:  [name], MD Pharmacy:   [id number] [place] [id number]: [phone]: [phone]    Social Determinants of Health (SDOH) Interventions    Readmission Risk Interventions     No data to display

## 2022-03-10 NOTE — Hospital Course (Signed)
HPI per Dr. [name] on 03/09/22  Hunt Oris Jaydin Boniface. is a 76 y.o. male with medical history significant for multiple myeloma on recent chemotherapy (03/03/22), paroxysmal A-fib not oral anticoagulated, essential hypertension, hyperlipidemia, hypothyroidism, mitral valve prolapse, hypothyroidism, chronic anxiety/depression who presented to [facility] ED from home with complaints of having recurrent subjective fevers with Tmax 102 at home.  Associated with mild dysuria.  Upon presentation to the ED, febrile with Tmax 102.8, no clear source of infection, UA negative for pyuria, electrolytes abnormalities and anemia of chronic disease.  Cultures were obtained and the patient was placed on antibiotics empirically Rocephin.  EDP discussed the case with medical oncology Dr. [name] who recommended admission for further work-up.   To note, the patient presented to the ED the day prior to presentation for the same, he was sent home with oral antibiotics for fever of unknown origin.     In the ED, febrile with Tmax 102.8.  Received 1 L IV fluid bolus normal saline, 1 dose cefepime, 1 dose 650 mg p.o. Tylenol.  TRH, hospitalist service, was asked to admit.   ED Course: Tmax 102.8.  BP 107/80, pulse 113, respiratory rate 21, saturation 99% on room air.  Lab studies significant for WBC 4.3, hemoglobin 8.6, baseline 9.  MCV 104.  Platelet count 99.   **Interim History  Patient continues to spike temperatures of unclear etiology.  He denies any chest pain, shortness of breath, burning or discomfort in his urine and no abdominal discomfort.  We will continue broad-spectrum antibiotics with IV cefepime but may need to add IV vancomycin however given his MRSA PCR being negative we will hold off at this time.  We will notify his primary medical oncologist of his admission.

## 2022-03-11 ENCOUNTER — Other Ambulatory Visit: Payer: Self-pay

## 2022-03-11 ENCOUNTER — Encounter (HOSPITAL_COMMUNITY): Payer: No Typology Code available for payment source

## 2022-03-11 ENCOUNTER — Inpatient Hospital Stay (HOSPITAL_COMMUNITY): Payer: No Typology Code available for payment source

## 2022-03-11 DIAGNOSIS — C9000 Multiple myeloma not having achieved remission: Secondary | ICD-10-CM | POA: Diagnosis not present

## 2022-03-11 DIAGNOSIS — R509 Fever, unspecified: Secondary | ICD-10-CM | POA: Diagnosis not present

## 2022-03-11 DIAGNOSIS — I480 Paroxysmal atrial fibrillation: Secondary | ICD-10-CM | POA: Diagnosis not present

## 2022-03-11 DIAGNOSIS — M7989 Other specified soft tissue disorders: Secondary | ICD-10-CM | POA: Diagnosis not present

## 2022-03-11 LAB — RESPIRATORY PANEL BY PCR

## 2022-03-11 LAB — CBC WITH DIFFERENTIAL/PLATELET
Abs Immature Granulocytes: 0.04 10*3/uL (ref 0.00–0.07)
Basophils Absolute: 0 10*3/uL (ref 0.0–0.1)
Basophils Relative: 1 %
Eosinophils Absolute: 0.1 10*3/uL (ref 0.0–0.5)
Eosinophils Relative: 2 %
HCT: 26.1 % — ABNORMAL LOW (ref 39.0–52.0)
Hemoglobin: 8.4 g/dL — ABNORMAL LOW (ref 13.0–17.0)
Immature Granulocytes: 1 %
Lymphocytes Relative: 8 %
Lymphs Abs: 0.3 10*3/uL — ABNORMAL LOW (ref 0.7–4.0)
MCH: 34.7 pg — ABNORMAL HIGH (ref 26.0–34.0)
MCHC: 32.2 g/dL (ref 30.0–36.0)
MCV: 107.9 fL — ABNORMAL HIGH (ref 80.0–100.0)
Monocytes Absolute: 0.3 10*3/uL (ref 0.1–1.0)
Monocytes Relative: 10 %
Neutro Abs: 2.7 10*3/uL (ref 1.7–7.7)
Neutrophils Relative %: 78 %
Platelets: 112 10*3/uL — ABNORMAL LOW (ref 150–400)
RBC: 2.42 MIL/uL — ABNORMAL LOW (ref 4.22–5.81)
RDW: 14.6 % (ref 11.5–15.5)
WBC: 3.4 10*3/uL — ABNORMAL LOW (ref 4.0–10.5)
nRBC: 0 % (ref 0.0–0.2)

## 2022-03-11 LAB — HEPARIN LEVEL (UNFRACTIONATED): Heparin Unfractionated: 0.48 IU/mL (ref 0.30–0.70)

## 2022-03-11 LAB — COMPREHENSIVE METABOLIC PANEL
ALT: 26 U/L (ref 0–44)
AST: 41 U/L (ref 15–41)
Albumin: 3 g/dL — ABNORMAL LOW (ref 3.5–5.0)
Alkaline Phosphatase: 70 U/L (ref 38–126)
Anion gap: 7 (ref 5–15)
BUN: 20 mg/dL (ref 8–23)
CO2: 20 mmol/L — ABNORMAL LOW (ref 22–32)
Calcium: 8 mg/dL — ABNORMAL LOW (ref 8.9–10.3)
Chloride: 110 mmol/L (ref 98–111)
Creatinine, Ser: 2.12 mg/dL — ABNORMAL HIGH (ref 0.61–1.24)
GFR, Estimated: 32 mL/min — ABNORMAL LOW (ref >60)
Glucose, Bld: 115 mg/dL — ABNORMAL HIGH (ref 70–99)
Potassium: 4.1 mmol/L (ref 3.5–5.1)
Sodium: 137 mmol/L (ref 135–145)
Total Bilirubin: 0.7 mg/dL (ref 0.3–1.2)
Total Protein: 6.1 g/dL — ABNORMAL LOW (ref 6.5–8.1)

## 2022-03-11 LAB — MAGNESIUM: Magnesium: 2.6 mg/dL — ABNORMAL HIGH (ref 1.7–2.4)

## 2022-03-11 LAB — PROCALCITONIN: Procalcitonin: 1.8 ng/mL

## 2022-03-11 LAB — TROPONIN I (HIGH SENSITIVITY)
Troponin I (High Sensitivity): 22 ng/L — ABNORMAL HIGH (ref <18)
Troponin I (High Sensitivity): 21 ng/L — ABNORMAL HIGH (ref <18)

## 2022-03-11 LAB — PHOSPHORUS: Phosphorus: 2.7 mg/dL (ref 2.5–4.6)

## 2022-03-11 MED ORDER — AMIODARONE LOAD VIA INFUSION
150.0000 mg | Freq: Once | INTRAVENOUS | Status: AC
  Administered 2022-03-11: 150 mg via INTRAVENOUS
  Filled 2022-03-11: qty 83.34

## 2022-03-11 MED ORDER — AMIODARONE HCL IN DEXTROSE 360-4.14 MG/200ML-% IV SOLN
30.0000 mg/h | INTRAVENOUS | Status: DC
  Administered 2022-03-12 – 2022-03-14 (×5): 30 mg/h via INTRAVENOUS
  Filled 2022-03-11 (×5): qty 200

## 2022-03-11 MED ORDER — HEPARIN (PORCINE) 25000 UT/250ML-% IV SOLN
1300.0000 [IU]/h | INTRAVENOUS | Status: DC
  Administered 2022-03-11 – 2022-03-13 (×3): 1300 [IU]/h via INTRAVENOUS
  Filled 2022-03-11 (×3): qty 250

## 2022-03-11 MED ORDER — AMIODARONE HCL IN DEXTROSE 360-4.14 MG/200ML-% IV SOLN
60.0000 mg/h | INTRAVENOUS | Status: DC
  Administered 2022-03-11 (×2): 60 mg/h via INTRAVENOUS
  Filled 2022-03-11 (×2): qty 200

## 2022-03-11 MED ORDER — HEPARIN BOLUS VIA INFUSION
4000.0000 [IU] | Freq: Once | INTRAVENOUS | Status: AC
Start: 2022-03-11 — End: 2022-03-11
  Administered 2022-03-11: 4000 [IU] via INTRAVENOUS
  Filled 2022-03-11: qty 4000

## 2022-03-11 MED ORDER — GUAIFENESIN ER 600 MG PO TB12
1200.0000 mg | ORAL_TABLET | Freq: Two times a day (BID) | ORAL | Status: DC
  Administered 2022-03-11 – 2022-03-17 (×12): 1200 mg via ORAL
  Filled 2022-03-11 (×12): qty 2

## 2022-03-11 MED ORDER — SODIUM CHLORIDE 0.9 % IV BOLUS
500.0000 mL | Freq: Once | INTRAVENOUS | Status: AC
  Administered 2022-03-11: 500 mL via INTRAVENOUS

## 2022-03-11 NOTE — Progress Notes (Signed)
Lower extremity venous bilateral study completed.   Please see CV Proc for preliminary results.   Jazilyn Siegenthaler, RDMS, RVT  

## 2022-03-11 NOTE — Progress Notes (Signed)
ANTICOAGULATION CONSULT NOTE - Initial Consult  Pharmacy Consult for Heparin Indication: atrial fibrillation  Allergies  Allergen Reactions   Zithromax [Azithromycin] Other (See Comments)    Patient Measurements: Height: '6\' 5"'$  (195.6 cm) Weight: 98.7 kg (217 lb 9.5 oz) IBW/kg (Calculated) : 89.1 Heparin Dosing Weight:  98.7 kg  Vital Signs: Temp: 99.7 F (37.6 C) (07/04 1432) Temp Source: Oral (07/04 1432) BP: 111/72 (07/04 1238) Pulse Rate: 99 (07/04 1238)  Labs: Recent Labs    03/08/22 2314 03/09/22 2037 03/10/22 0401 03/11/22 0356  HGB  --  8.6* 9.1* 8.4*  HCT  --  25.5* 28.0* 26.1*  PLT  --  99* 98* 112*  LABPROT 13.6 14.5  --   --   INR 1.1 1.1  --   --   CREATININE  --  2.01* 1.99* 2.12*    Estimated Creatinine Clearance: 37.4 mL/min (A) (by C-G formula based on SCr of 2.12 mg/dL (H)).   Medical History: Past Medical History:  Diagnosis Date   Allergy    Anxiety    Arthritis    Depression    Heart murmur    Hyperlipidemia    Hyperplastic colon polyp    Hyperproteinemia    Hypertension    Hypothyroidism    Mitral valve prolapse    Paroxysmal atrial fibrillation (HCC)    Thyroid disease    Assessment: Active Problem(s): FUO. Mild dysuria, Afib  PMH: ultiple myeloma on recent chemotherapy (03/03/22), paroxysmal A-fib not oral anticoagulated, essential hypertension, hyperlipidemia, hypothyroidism, mitral valve prolapse, hypothyroidism, chronic anxiety/depression, CKD3  AC/Heme: No anticoag PTA. Start IV heparin for afib.  Hgb 8.4. Plts only 112. CHA2DS2-VASc score of 3  Goal of Therapy:  Heparin level 0.3-0.7 units/ml Monitor platelets by anticoagulation protocol: Yes   Plan:  D/c Lovenox Monitor thrombocytopenia IV heparin 4000 unit bolus Start IV heparin 1300 units/hr Check heparin level in 6-8 hrs. DAily HL and CBC   Darreld Hoffer S. Alford Highland, PharmD, BCPS Clinical Staff Pharmacist [email or web address] Alford Highland, Tajia Szeliga Stillinger 03/11/2022,2:41  PM

## 2022-03-11 NOTE — Progress Notes (Signed)
PROGRESS NOTE    Darren Hinton.  XVQ:[id number] DOB: 08/11/1946 DOA: 03/09/2022 PCP: [name], MD   Brief Narrative:  HPI per Dr. Irene [place] on 03/09/22  Darren H[place]. is a 76 y.o. male with medical history significant for multiple myeloma on recent chemotherapy (03/03/22), paroxysmal A-fib not oral anticoagulated, essential hypertension, hyperlipidemia, hypothyroidism, mitral valve prolapse, hypothyroidism, chronic anxiety/depression who presented to [facility] ED from home with complaints of having recurrent subjective fevers with Tmax 102 at home.  Associated with mild dysuria.  Upon presentation to the ED, febrile with Tmax 102.8, no clear source of infection, UA negative for pyuria, electrolytes abnormalities and anemia of chronic disease.  Cultures were obtained and the patient was placed on antibiotics empirically Rocephin.  EDP discussed the case with medical oncology Dr. Marin [place] who recommended admission for further work-up.   To note, the patient presented to the ED the day prior to presentation for the same, he was sent home with oral antibiotics for fever of unknown origin.     In the ED, febrile with Tmax 102.8.  Received 1 L IV fluid bolus normal saline, 1 dose cefepime, 1 dose 650 mg p.o. Tylenol.  TRH, hospitalist service, was asked to admit.   ED Course: Tmax 102.8.  BP 107/80, pulse 113, respiratory rate 21, saturation 99% on room air.  Lab studies significant for WBC 4.3, hemoglobin 8.6, baseline 9.  MCV 104.  Platelet count 99.   **Interim History  Patient continues to spike temperatures of unclear etiology.  He denies any chest pain, shortness of breath, burning or discomfort in his urine and no abdominal discomfort.  We will continue broad-spectrum antibiotics with IV cefepime but may need to add IV vancomycin however given his MRSA PCR being negative we will hold off at this time.  We will notify his primary medical oncologist of his admission.   Assessment  and Plan:  Fever of Unclear Etiology -Tmax 102.8 in the ED and he continues to spike temperatures today he spiked a temperature of 101 -UA negative for pyuria denies any urinary discomfort; urinalysis showed clear appearance with yellow color urine, negative glucose, small hemoglobin, negative ketones, negative leukocytes, negative nitrites, no bacteria seen -Chest x-ray done on 03/08/2022 nonacute but it was repeated and showed "Interval development of interstitial opacities within the left mid to lower lung concerning for atypical/viral infection.  Streaky right basilar opacity may represent atelectasis versus developing infection." -Procalcitonin level is 1.80 -Follow blood cultures and urine culture and blood cultures were repeated again so they are still pending and showing no growth -No leukocytosis still and now he has some leukopenia at 3.4 -Follow fever curve and WBC -Fever likely in the setting of chemotherapy and will need to notify his primary oncologist and I have added Dr. Lorenso [place] to the team and will call in the morning -Check respiratory virus panel and pending  -Continue supportive care and continue with ice packs and give another 500 mL bolus and continue with IV fluid hydration at 50 MLS per hour -Check lower extremity duplex for DVTs as cause of fever and this showed no evidence of DVTs -Will consider evaluating for PE once his HR stabilizes  -Continue with acetaminophen and avoid ibuprofen; acetaminophen was increased to 650 mg every 4 as needed for fever -Follow-up on cultures and discuss with Oncology   Paroxysmal A-fib with RVR/A Flutter now with RVR -Presented with rates in the 120s and heart rates had improved and more rate  controlled however he went into A-fib with RVR into the 150s to 160s -CHA2DS2-VASc score of 3 -Not on oral anticoagulation prior to admission but will start him on IV heparin -Check ECHOCardiogram and Troponin -Placed on IV amiodarone bolus and  drip -Resume home Toprol-XL -Closely monitor on the progressive care unit and cardiology will follow in the morning after I discussed with Dr. Johney [place] today   Hypovolemic Hyponatremia -Serum sodium 134 on admission and is now 137 -Encourage oral intake as tolerated -LR KCl 40 mEq 50 cc/h x 1 day. -His hyponatremia has improved we will continue to monitor and trend   Hypokalemia Hypophosphatemia -Potassium was 3.4 on admission and improved to 4.1 -Repleted intravenously -Phos level is now 2.7 and Mag Level is 2.6 -Continue monitoring replete as necessary -Repeat CMP and Mag, and Phos in a.m.   Mild non anion gap metabolic acidosis -Serum bicarb 16, anion gap 8 on admission and now patient CO2 is 20, anion gap is 7, chloride level is 110  -Continue with IV fluid hydration -Continue monitor and trend and repeat CMP in the a.m.   CKD 3B -Appears to be close to his baseline creatinine 2.01 with GFR of 34 on admission. -Patient's BUNs/creatinine is now gone from 20/1.98 -> 20/1.99 -> 20/1.99 -> 20/2.12 -Avoid nephrotoxic medications, contrast dyes, hypotension and dehydration to ensure adequate renal perfusion and renally adjust medications -Continue monitor I's and O's -Repeat CMP in the a.m.   Anemia of chronic disease/macrocytic anemia -Likely contributed by chemotherapy -Patient's hemoglobin/hematocrit went from 8.6/25.5 and is now improved slightly to 9.1/28.0 with an MCV of 108.1 -Continue monitor trend and repeat CMP in the a.m.   Multiple myeloma with recent chemotherapy (03/03/22) -EDP discussed the case with medical oncology on-call Dr. Marin [place] who recommended admission for further work-up. -We will add patient's primary oncologist and discuss with them in the morning   Hypothyroidism -Resume home levothyroxine -Check TSH in the AM    Thrombocytopenia -Mild and patient's platelet count went from 99 is now 98 -Continue to monitor for signs and symptoms bleeding; no  overt bleeding noted -Repeat CBC in a.m.   DVT prophylaxis: heparin bolus via infusion 4,000 Units Start: 03/11/22 1530 SCDs Start: 03/09/22 2311    Code Status: Full Code Family Communication: Discussed with wife at bedside   Disposition Plan:  Level of care: Telemetry Status is: Inpatient Remains inpatient appropriate because: Continues to Spike Temp   Consultants:  Notified Oncology via Epic Cardiology   Procedures:  See above ECHOCardiogram pending to be done  Antimicrobials:  Anti-infectives (From admission, onward)    Start     Dose/Rate Route Frequency Ordered Stop   03/10/22 1000  ceFEPIme (MAXIPIME) 2 g in sodium chloride 0.9 % 100 mL IVPB        2 g 200 mL/hr over 30 Minutes Intravenous Every 12 hours 03/09/22 2335     03/09/22 2200  ceFEPIme (MAXIPIME) 2 g in sodium chloride 0.9 % 100 mL IVPB        2 g 200 mL/hr over 30 Minutes Intravenous  Once 03/09/22 2147 03/09/22 2229       Subjective: Seen and examined at bedside this morning and is doing okay.  States that he had a slight cough this morning.  No chest pain or shortness of breath.  Subsequently went into A-fib with RVR/a flutter and was tacking away in the 160s.  He denied complaints and denies any burning or discomfort and denies any diarrhea and had  a bowel movement earlier.  No other concerns or complaints this time  Objective: Vitals:   03/11/22 1415 03/11/22 1430 03/11/22 1432 03/11/22 1445  BP:   (!) 167/144 104/64  Pulse:   95   Resp: (!) 22 (!) 24  (!) 23  Temp:   99.7 F (37.6 C)   TempSrc:   Oral   SpO2:   98%   Weight:      Height:        Intake/Output Summary (Last 24 hours) at 03/11/2022 1537 Last data filed at 03/11/2022 1010 Gross per 24 hour  Intake 1379.89 ml  Output 800 ml  Net 579.89 ml   Filed Weights   03/09/22 2029 03/10/22 0020  Weight: 98.9 kg 98.7 kg   Examination: Physical Exam:  Constitutional: WN/WD slightly overweight African-American male currently no acute  distress appears calm Respiratory: Diminished to auscultation bilaterally, no wheezing, rales, rhonchi or crackles. Normal respiratory effort and patient is not tachypenic. No accessory muscle use.  Unlabored breathing Cardiovascular: Irregularly irregular and mildly tachycardic, no murmurs / rubs / gallops. S1 and S2 auscultated.  No appreciable extremity edema Abdomen: Soft, non-tender, non-distended.  Bowel sounds positive.  GU: Deferred. Musculoskeletal: No clubbing / cyanosis of digits/nails. No joint deformity upper and lower extremities. Skin: Has some skin discoloration on the left leg from past wound.  Neurologic: CN 2-12 grossly intact with no focal deficits. Romberg sign and cerebellar reflexes not assessed.  Psychiatric: Normal judgment and insight. Alert and oriented x 3. Normal mood and appropriate affect.   Data Reviewed: I have personally reviewed following labs and imaging studies  CBC: Recent Labs  Lab 03/08/22 2246 03/09/22 2037 03/10/22 0401 03/11/22 0356  WBC 3.6* 4.3 5.7 3.4*  NEUTROABS 2.9 3.6 4.8 2.7  HGB 9.2* 8.6* 9.1* 8.4*  HCT 28.3* 25.5* 28.0* 26.1*  MCV 106.4* 104.5* 108.1* 107.9*  PLT 124* 99* 98* 056*   Basic Metabolic Panel: Recent Labs  Lab 03/08/22 2246 03/09/22 2037 03/10/22 0401 03/11/22 0356  NA 139 134* 136 137  K 3.6 3.4* 3.6 4.1  CL 113* 108 110 110  CO2 18* 18* 18* 20*  GLUCOSE 140* 106* 100* 115*  BUN '20 19 20 20  ' CREATININE 1.98* 2.01* 1.99* 2.12*  CALCIUM 8.7* 7.9* 7.9* 8.0*  MG  --  2.2 1.8 2.6*  PHOS  --   --  2.0* 2.7   GFR: Estimated Creatinine Clearance: 37.4 mL/min (A) (by C-G formula based on SCr of 2.12 mg/dL (H)). Liver Function Tests: Recent Labs  Lab 03/08/22 2246 03/09/22 2037 03/10/22 0401 03/11/22 0356  AST '17 25 30 ' 41  ALT '14 15 18 26  ' ALKPHOS 95 76 73 70  BILITOT 1.1 1.0 1.0 0.7  PROT 6.9 6.7 6.4* 6.1*  ALBUMIN 3.8 3.6 3.4* 3.0*   No results for input(s): "LIPASE", "AMYLASE" in the last 168  hours. No results for input(s): "AMMONIA" in the last 168 hours. Coagulation Profile: Recent Labs  Lab 03/08/22 2314 03/09/22 2037  INR 1.1 1.1   Cardiac Enzymes: No results for input(s): "CKTOTAL", "CKMB", "CKMBINDEX", "TROPONINI" in the last 168 hours. BNP (last 3 results) No results for input(s): "PROBNP" in the last 8760 hours. HbA1C: No results for input(s): "HGBA1C" in the last 72 hours. CBG: No results for input(s): "GLUCAP" in the last 168 hours. Lipid Profile: No results for input(s): "CHOL", "HDL", "LDLCALC", "TRIG", "CHOLHDL", "LDLDIRECT" in the last 72 hours. Thyroid Function Tests: No results for input(s): "TSH", "T4TOTAL", "FREET4", "T3FREE", "  THYROIDAB" in the last 72 hours. Anemia Panel: No results for input(s): "VITAMINB12", "FOLATE", "FERRITIN", "TIBC", "IRON", "RETICCTPCT" in the last 72 hours. Sepsis Labs: Recent Labs  Lab 03/08/22 2246 03/09/22 2037 03/11/22 1425  PROCALCITON  --   --  1.80  LATICACIDVEN 1.2 0.9  --     Recent Results (from the past 240 hour(s))  SARS Coronavirus 2 by RT PCR (hospital order, performed in [facility] hospital lab) *cepheid single result test* Anterior Nasal Swab     Status: None   Collection Time: 03/08/22 11:13 PM   Specimen: Anterior Nasal Swab  Result Value Ref Range Status   SARS Coronavirus 2 by RT PCR NEGATIVE NEGATIVE Final    Comment: (NOTE) SARS-CoV-2 target nucleic acids are NOT DETECTED.  The SARS-CoV-2 RNA is generally detectable in upper and lower respiratory specimens during the acute phase of infection. The lowest concentration of SARS-CoV-2 viral copies this assay can detect is 250 copies / mL. A negative result does not preclude SARS-CoV-2 infection and should not be used as the sole basis for treatment or other patient management decisions.  A negative result may occur with improper specimen collection / handling, submission of specimen other than nasopharyngeal swab, presence of viral mutation(s)  within the areas targeted by this assay, and inadequate number of viral copies (<250 copies / mL). A negative result must be combined with clinical observations, patient history, and epidemiological information.  Fact Sheet for Patients:   https://www.patel.info/  Fact Sheet for Healthcare Providers: https://hall.com/  This test is not yet approved or  cleared by the [place] FDA and has been authorized for detection and/or diagnosis of SARS-CoV-2 by FDA under an Emergency Use Authorization (EUA).  This EUA will remain in effect (meaning this test can be used) for the duration of the COVID-19 declaration under Section 564(b)(1) of the Act, 21 U.S.C. section 360bbb-3(b)(1), unless the authorization is terminated or revoked sooner.  Performed at [facility], [place] [address] [place], [place] [address]   Blood culture (routine x 2)     Status: None (Preliminary result)   Collection Time: 03/08/22 11:14 PM   Specimen: BLOOD  Result Value Ref Range Status   Specimen Description   Final    BLOOD Performed at [facility] [address] [place], [place] [address]    Special Requests   Final    BOTTLES DRAWN AEROBIC AND ANAEROBIC Blood Culture adequate volume Performed at [facility] [address] [place], [place] [address]    Culture   Final    NO GROWTH 2 DAYS Performed at [facility] [address], [place], [place] [address]    Report Status PENDING  Incomplete  Blood culture (routine x 2)     Status: None (Preliminary result)   Collection Time: 03/08/22 11:50 PM   Specimen: BLOOD  Result Value Ref Range Status   Specimen Description   Final    BLOOD LEFT ANTECUBITAL Performed at [facility] [address] [place], [place] [address]    Special Requests   Final    BOTTLES DRAWN AEROBIC AND ANAEROBIC Blood Culture results may not be  optimal due to an excessive volume of blood received in culture bottles Performed at [facility] [address], [place], [place] [address]    Culture   Final    NO GROWTH 2 DAYS Performed at [facility] [address] [place], [place]  [address]    Report Status PENDING  Incomplete  Urine Culture     Status: Abnormal   Collection Time: 03/09/22 12:04 AM   Specimen: Urine, Clean Catch  Result Value Ref Range Status   Specimen Description   Final    URINE, CLEAN CATCH Performed at [facility], [place] [address], [place], [place] [address]    Special Requests   Final    NONE Performed at [facility], [place] [address] [place], [place] [address]    Culture (A)  Final    <10,000 COLONIES/mL INSIGNIFICANT GROWTH Performed at [facility] [address] [place], [place] [address]    Report Status 03/10/2022 FINAL  Final  Culture, blood (Routine x 2)     Status: None (Preliminary result)   Collection Time: 03/09/22  8:37 PM   Specimen: BLOOD  Result Value Ref Range Status   Specimen Description   Final    BLOOD LEFT ANTECUBITAL Performed at [facility] [address] [place], [place] [address]    Special Requests   Final    BOTTLES DRAWN AEROBIC AND ANAEROBIC Blood Culture results may not be optimal due to an excessive volume of blood received in culture bottles Performed at [facility] [address], [place], [place] [address]    Culture   Final    NO GROWTH 1 DAY Performed at [facility], [place] [address], [place], [place] [address]    Report Status PENDING  Incomplete  Culture, blood (Routine x 2)     Status: None (Preliminary result)   Collection Time: 03/09/22  8:40 PM   Specimen: BLOOD  Result Value Ref Range Status   Specimen Description   Final    BLOOD BLOOD LEFT HAND Performed at [facility] [address] [place],  [place] [address]    Special Requests   Final    BOTTLES DRAWN AEROBIC AND ANAEROBIC Blood Culture results may not be optimal due to an inadequate volume of blood received in culture bottles Performed at [facility] [address] [place], [place] [address]    Culture   Final    NO GROWTH 1 DAY Performed at [facility], [place] [address] [place], [place] [address]    Report Status PENDING  Incomplete  MRSA Next Gen by PCR, Nasal     Status: None   Collection Time: 03/10/22 12:17 PM   Specimen: Nasal Mucosa; Nasal Swab  Result Value Ref Range Status   MRSA by PCR Next Gen NOT DETECTED NOT DETECTED Final    Comment: (NOTE) The GeneXpert MRSA Assay (FDA approved for NASAL specimens only), is one component of a comprehensive MRSA colonization surveillance program. It is not intended to diagnose MRSA infection nor to guide or monitor treatment for MRSA infections. Test performance is not FDA approved in patients less than 27 years old. Performed at [facility], [place] [address] [place], [place] [address]     Radiology Studies: VAS [place] LOWER EXTREMITY VENOUS (DVT)  Result Date: 03/11/2022  Lower Venous DVT Study Patient Name:  Beulah Matusek.  Date of Exam:   03/11/2022 Medical Rec #: [id number]            Accession #:    [id number] Date of Birth: 1946-01-19           Patient Gender: M Patient Age:   64 years Exam Location:  [facility] Procedure:      VAS [place] LOWER EXTREMITY VENOUS (DVT) Referring Phys: Raiford Noble --------------------------------------------------------------------------------  Indications: Swelling.  Comparison Study: No prior studies. Performing Technologist: Darlin Coco RDMS, RVT  Examination Guidelines: A complete evaluation includes B-mode imaging, spectral Doppler, color Doppler, and power Doppler as needed of all accessible portions of each vessel. Bilateral testing is considered an integral part of a complete examination.  Limited examinations for reoccurring indications may be performed as noted. The reflux portion of the exam is performed with the patient in reverse Trendelenburg.  +---------+---------------+---------+-----------+----------+--------------+ RIGHT    CompressibilityPhasicitySpontaneityPropertiesThrombus Aging +---------+---------------+---------+-----------+----------+--------------+ CFV      Full           Yes      Yes                                 +---------+---------------+---------+-----------+----------+--------------+ SFJ      Full                                                        +---------+---------------+---------+-----------+----------+--------------+ FV Prox  Full                                                        +---------+---------------+---------+-----------+----------+--------------+ FV Mid   Full                                                        +---------+---------------+---------+-----------+----------+--------------+ FV DistalFull                                                        +---------+---------------+---------+-----------+----------+--------------+ PFV      Full                                                        +---------+---------------+---------+-----------+----------+--------------+ POP      Full           Yes      Yes                                 +---------+---------------+---------+-----------+----------+--------------+ PTV      Full                                                        +---------+---------------+---------+-----------+----------+--------------+ PERO     Full                                                        +---------+---------------+---------+-----------+----------+--------------+   +---------+---------------+---------+-----------+----------+--------------+  LEFT     CompressibilityPhasicitySpontaneityPropertiesThrombus Aging  +---------+---------------+---------+-----------+----------+--------------+ CFV      Full           Yes      Yes                                 +---------+---------------+---------+-----------+----------+--------------+ SFJ      Full                                                        +---------+---------------+---------+-----------+----------+--------------+ FV Prox  Full                                                        +---------+---------------+---------+-----------+----------+--------------+ FV Mid   Full                                                        +---------+---------------+---------+-----------+----------+--------------+ FV DistalFull                                                        +---------+---------------+---------+-----------+----------+--------------+ PFV      Full                                                        +---------+---------------+---------+-----------+----------+--------------+ POP      Full           Yes      Yes                                 +---------+---------------+---------+-----------+----------+--------------+ PTV      Full                                                        +---------+---------------+---------+-----------+----------+--------------+ PERO     Full                                                        +---------+---------------+---------+-----------+----------+--------------+    Summary: RIGHT: - There is no evidence of deep vein thrombosis in the lower extremity.  - No cystic structure found in the popliteal fossa.  LEFT: - There is no evidence of deep vein thrombosis in the lower extremity.  - No cystic  structure found in the popliteal fossa.  *See table(s) above for measurements and observations.    Preliminary    DG CHEST PORT 1 VIEW  Result Date: 03/11/2022 CLINICAL DATA:  Shortness of breath EXAM: PORTABLE CHEST 1 VIEW COMPARISON:  03/08/2022 FINDINGS: Heart size  within normal limits. Interval development of interstitial opacities within the left mid to lower lung. Elevation of the right hemidiaphragm with streaky right basilar opacity. No pleural effusion or pneumothorax. IMPRESSION: 1. Interval development of interstitial opacities within the left mid to lower lung concerning for atypical/viral infection. 2. Streaky right basilar opacity may represent atelectasis versus developing infection. Electronically Signed   By: Davina Poke D.O.   On: 03/11/2022 10:37    Scheduled Meds:  guaiFENesin  1,200 mg Oral BID   heparin  4,000 Units Intravenous Once   levothyroxine  100 mcg Oral QAC breakfast   metoprolol succinate  12.5 mg Oral Daily   Continuous Infusions:  amiodarone 60 mg/hr (03/11/22 1456)   Followed by   amiodarone     ceFEPime (MAXIPIME) IV Stopped (03/11/22 1010)   heparin      LOS: 2 days   Raiford Noble, DO [facility] Available via Epic secure chat 7am-7pm After these hours, please refer to coverage provider listed on [email or web address] 03/11/2022, 3:37 PM

## 2022-03-12 ENCOUNTER — Inpatient Hospital Stay (HOSPITAL_COMMUNITY): Payer: No Typology Code available for payment source

## 2022-03-12 ENCOUNTER — Other Ambulatory Visit: Payer: Self-pay | Admitting: *Deleted

## 2022-03-12 DIAGNOSIS — I4819 Other persistent atrial fibrillation: Secondary | ICD-10-CM | POA: Diagnosis not present

## 2022-03-12 DIAGNOSIS — I4892 Unspecified atrial flutter: Secondary | ICD-10-CM | POA: Diagnosis not present

## 2022-03-12 DIAGNOSIS — I4891 Unspecified atrial fibrillation: Secondary | ICD-10-CM | POA: Diagnosis not present

## 2022-03-12 DIAGNOSIS — R509 Fever, unspecified: Secondary | ICD-10-CM | POA: Diagnosis not present

## 2022-03-12 DIAGNOSIS — D509 Iron deficiency anemia, unspecified: Secondary | ICD-10-CM | POA: Diagnosis not present

## 2022-03-12 LAB — IRON AND TIBC
Iron: 10 ug/dL — ABNORMAL LOW (ref 45–182)
Saturation Ratios: 5 % — ABNORMAL LOW (ref 17.9–39.5)
TIBC: 197 ug/dL — ABNORMAL LOW (ref 250–450)
UIBC: 187 ug/dL

## 2022-03-12 LAB — COMPREHENSIVE METABOLIC PANEL
ALT: 25 U/L (ref 0–44)
AST: 31 U/L (ref 15–41)
Albumin: 2.9 g/dL — ABNORMAL LOW (ref 3.5–5.0)
Alkaline Phosphatase: 69 U/L (ref 38–126)
Anion gap: 6 (ref 5–15)
BUN: 19 mg/dL (ref 8–23)
CO2: 19 mmol/L — ABNORMAL LOW (ref 22–32)
Calcium: 7.7 mg/dL — ABNORMAL LOW (ref 8.9–10.3)
Chloride: 110 mmol/L (ref 98–111)
Creatinine, Ser: 2.04 mg/dL — ABNORMAL HIGH (ref 0.61–1.24)
GFR, Estimated: 33 mL/min — ABNORMAL LOW (ref >60)
Glucose, Bld: 103 mg/dL — ABNORMAL HIGH (ref 70–99)
Potassium: 4.2 mmol/L (ref 3.5–5.1)
Sodium: 135 mmol/L (ref 135–145)
Total Bilirubin: 0.7 mg/dL (ref 0.3–1.2)
Total Protein: 5.8 g/dL — ABNORMAL LOW (ref 6.5–8.1)

## 2022-03-12 LAB — CBC WITH DIFFERENTIAL/PLATELET
Abs Immature Granulocytes: 0.03 10*3/uL (ref 0.00–0.07)
Basophils Absolute: 0 10*3/uL (ref 0.0–0.1)
Basophils Relative: 0 %
Eosinophils Absolute: 0.2 10*3/uL (ref 0.0–0.5)
Eosinophils Relative: 5 %
HCT: 23.7 % — ABNORMAL LOW (ref 39.0–52.0)
Hemoglobin: 7.7 g/dL — ABNORMAL LOW (ref 13.0–17.0)
Immature Granulocytes: 1 %
Lymphocytes Relative: 14 %
Lymphs Abs: 0.5 10*3/uL — ABNORMAL LOW (ref 0.7–4.0)
MCH: 34.4 pg — ABNORMAL HIGH (ref 26.0–34.0)
MCHC: 32.5 g/dL (ref 30.0–36.0)
MCV: 105.8 fL — ABNORMAL HIGH (ref 80.0–100.0)
Monocytes Absolute: 0.4 10*3/uL (ref 0.1–1.0)
Monocytes Relative: 10 %
Neutro Abs: 2.6 10*3/uL (ref 1.7–7.7)
Neutrophils Relative %: 70 %
Platelets: 141 10*3/uL — ABNORMAL LOW (ref 150–400)
RBC: 2.24 MIL/uL — ABNORMAL LOW (ref 4.22–5.81)
RDW: 15.1 % (ref 11.5–15.5)
WBC: 3.7 10*3/uL — ABNORMAL LOW (ref 4.0–10.5)
nRBC: 0 % (ref 0.0–0.2)

## 2022-03-12 LAB — RETICULOCYTES
Immature Retic Fract: 11 % (ref 2.3–15.9)
RBC.: 2.3 MIL/uL — ABNORMAL LOW (ref 4.22–5.81)
Retic Count, Absolute: 45.5 10*3/uL (ref 19.0–186.0)
Retic Ct Pct: 2 % (ref 0.4–3.1)

## 2022-03-12 LAB — ECHOCARDIOGRAM COMPLETE
Area-P 1/2: 4.19 cm2
Calc EF: 47.3 %
Height: 77 in
S' Lateral: 3.3 cm
Single Plane A2C EF: 45.7 %
Single Plane A4C EF: 53.4 %
Weight: 3481.5 oz

## 2022-03-12 LAB — FERRITIN: Ferritin: 625 ng/mL — ABNORMAL HIGH (ref 24–336)

## 2022-03-12 LAB — HEPARIN LEVEL (UNFRACTIONATED): Heparin Unfractionated: 0.54 IU/mL (ref 0.30–0.70)

## 2022-03-12 LAB — TSH: TSH: 3.629 u[IU]/mL (ref 0.350–4.500)

## 2022-03-12 LAB — PHOSPHORUS: Phosphorus: 2.1 mg/dL — ABNORMAL LOW (ref 2.5–4.6)

## 2022-03-12 LAB — FOLATE: Folate: 9.9 ng/mL (ref >5.9)

## 2022-03-12 LAB — PROCALCITONIN: Procalcitonin: 1.48 ng/mL

## 2022-03-12 LAB — MAGNESIUM: Magnesium: 2.4 mg/dL (ref 1.7–2.4)

## 2022-03-12 LAB — VITAMIN B12: Vitamin B-12: 358 pg/mL (ref 180–914)

## 2022-03-12 MED ORDER — FERROUS GLUCONATE 324 (38 FE) MG PO TABS
324.0000 mg | ORAL_TABLET | Freq: Three times a day (TID) | ORAL | Status: DC
  Administered 2022-03-12 – 2022-03-17 (×12): 324 mg via ORAL
  Filled 2022-03-12 (×12): qty 1

## 2022-03-12 MED ORDER — CYANOCOBALAMIN 1000 MCG/ML IJ SOLN
1000.0000 ug | Freq: Every day | INTRAMUSCULAR | Status: AC
  Administered 2022-03-12 – 2022-03-16 (×4): 1000 ug via INTRAMUSCULAR
  Filled 2022-03-12 (×6): qty 1

## 2022-03-12 MED ORDER — METOPROLOL SUCCINATE ER 25 MG PO TB24
12.5000 mg | ORAL_TABLET | Freq: Once | ORAL | Status: AC
  Administered 2022-03-12: 12.5 mg via ORAL
  Filled 2022-03-12: qty 1

## 2022-03-12 MED ORDER — METOPROLOL SUCCINATE ER 25 MG PO TB24
25.0000 mg | ORAL_TABLET | Freq: Every day | ORAL | Status: DC
  Administered 2022-03-13 – 2022-03-14 (×2): 25 mg via ORAL
  Filled 2022-03-12 (×2): qty 1

## 2022-03-12 MED ORDER — POLYETHYLENE GLYCOL 3350 17 G PO PACK
17.0000 g | PACK | Freq: Once | ORAL | Status: AC
  Administered 2022-03-12: 17 g via ORAL
  Filled 2022-03-12: qty 1

## 2022-03-12 MED ORDER — ALLOPURINOL 300 MG PO TABS: 300.0000 mg | ORAL_TABLET | Freq: Every day | ORAL | 1 refills | Status: DC

## 2022-03-12 MED ORDER — PERFLUTREN LIPID MICROSPHERE
1.0000 mL | INTRAVENOUS | Status: AC | PRN
  Administered 2022-03-12: 2 mL via INTRAVENOUS

## 2022-03-12 NOTE — Progress Notes (Signed)
ANTICOAGULATION CONSULT NOTE  Pharmacy Consult for Heparin Indication: atrial fibrillation  Allergies  Allergen Reactions   Zithromax [Azithromycin] Other (See Comments)    Patient Measurements: Height: '6\' 5"'  (195.6 cm) Weight: 98.7 kg (217 lb 9.5 oz) IBW/kg (Calculated) : 89.1 Heparin Dosing Weight:  98.7 kg  Vital Signs: Temp: 99.6 F (37.6 C) (07/04 2030) Temp Source: Oral (07/04 2030) BP: 96/77 (07/04 2300) Pulse Rate: 96 (07/04 2300)  Labs: Recent Labs    03/09/22 2037 03/10/22 0401 03/11/22 0356 03/11/22 1422 03/11/22 1555 03/11/22 2317  HGB 8.6* 9.1* 8.4*  --   --   --   HCT 25.5* 28.0* 26.1*  --   --   --   PLT 99* 98* 112*  --   --   --   LABPROT 14.5  --   --   --   --   --   INR 1.1  --   --   --   --   --   HEPARINUNFRC  --   --   --   --   --  0.48  CREATININE 2.01* 1.99* 2.12*  --   --   --   TROPONINIHS  --   --   --  22* 21*  --      Estimated Creatinine Clearance: 37.4 mL/min (A) (by C-G formula based on SCr of 2.12 mg/dL (H)).   Medical History: Past Medical History:  Diagnosis Date   Allergy    Anxiety    Arthritis    Depression    Heart murmur    Hyperlipidemia    Hyperplastic colon polyp    Hyperproteinemia    Hypertension    Hypothyroidism    Mitral valve prolapse    Paroxysmal atrial fibrillation (HCC)    Thyroid disease    Assessment: Active Problem(s): FUO. Mild dysuria, Afib  PMH: Multiple myeloma on recent chemotherapy (03/03/22), paroxysmal A-fib not oral anticoagulated, essential hypertension, hyperlipidemia, hypothyroidism, mitral valve prolapse, hypothyroidism, chronic anxiety/depression, CKD3  AC/Heme: No anticoag PTA. Start IV heparin for afib.  Baseline CBC: Hgb 8.4. Plts only 112. CHA2DS2-VASc score of 3  03/12/2022: Initial heparin level 0.48- therapeutic on IV heparin 1300 units/hr No bleeding or infusion related concerns reported by RN  Goal of Therapy:  Heparin level 0.3-0.7 units/ml Monitor platelets by  anticoagulation protocol: Yes   Plan:  Continue IV heparin 1300 units/hr Check confirmatory heparin level with morning labs ~ 0600 Daily HL and CBC- monitor closely for thrombocytopenia  Netta Cedars PharmD 03/12/2022,12:03 AM

## 2022-03-12 NOTE — Progress Notes (Signed)
PROGRESS NOTE  Darren Hinton. BZJ:[id number] DOB: 1946/01/10 DOA: 03/09/2022 PCP: [name], MD   LOS: 3 days   Brief Narrative / Interim history: 76 year old male with multiple myeloma recently had chemotherapy 6/26, PAF not on anticoagulation, HTN, HLD, hypothyroidism, comes into the hospital with subjective fevers.  Imaging was concerning for developing pneumonia, he was placed on antibiotics and was admitted to the hospital.  He developed A-fib with RVR  Subjective / 24h Interval events: Feeling little bit better today.  Denies any shortness of breath but does feel somewhat short winded with activities.  No cough.  No chest pain, no palpitations  Assesement and Plan: Principal problem Fever due to pneumonia-fever curve improving.  He was placed on antibiotics, continue.  He does not really have significant respiratory symptoms so source may be different.  Monitor cultures.  COVID and RVP negative.  Procalcitonin elevated  Active problems PAF, A-fib with RVR-patient placed on anticoagulation with heparin, amiodarone infusion.  Cardiology consulted, scheduled for TEE cardioversion later this week.  Hypovolemic hyponatremia-sodium normalized with fluids  Hypokalemia, hypophosphatemia-monitor and replete as indicated  CKD 3B-appears to be close to baseline.  Continue to monitor  Anemia of chronic disease, macrocytic anemia-B12 borderline on the lower side of normal, will replete.  He is also iron deficient, placed on iron.  He also has underlying malignancy  Multiple myeloma, recent chemotherapy-outpatient management  Thrombocytopenia-monitor, improving  Hypothyroidism-continue Synthroid.  TSH unremarkable  Scheduled Meds:  guaiFENesin  1,200 mg Oral BID   levothyroxine  100 mcg Oral QAC breakfast   metoprolol succinate  12.5 mg Oral Daily   Continuous Infusions:  amiodarone 30 mg/hr (03/12/22 0211)   ceFEPime (MAXIPIME) IV 2 g (03/12/22 0758)   heparin 1,300  Units/hr (03/12/22 0751)   PRN Meds:.acetaminophen, HYDROmorphone (DILAUDID) injection, melatonin, ondansetron (ZOFRAN) IV, mouth rinse, oxyCODONE, polyethylene glycol  Diet Orders (From admission, onward)     Start     Ordered   03/09/22 2346  Diet Heart Room service appropriate? Yes; Fluid consistency: Thin  Diet effective now       Question Answer Comment  Room service appropriate? Yes   Fluid consistency: Thin      03/09/22 2345            DVT prophylaxis: SCDs Start: 03/09/22 2311   Lab Results  Component Value Date   PLT 141 (L) 03/12/2022      Code Status: Full Code  Family Communication: wife at bedside  Status is: Inpatient  Remains inpatient appropriate because: Severity of illness   Level of care: Progressive  Consultants:  Cardiology   Objective: Vitals:   03/12/22 1011 03/12/22 1024 03/12/22 1058 03/12/22 1130  BP:  104/62 111/89 115/76  Pulse: (!) 151 (!) 131  (!) 131  Resp:  16    Temp:   98.4 F (36.9 C)   TempSrc:   Oral   SpO2:  100%  97%  Weight:      Height:        Intake/Output Summary (Last 24 hours) at 03/12/2022 1234 Last data filed at 03/12/2022 0900 Gross per 24 hour  Intake 1025.03 ml  Output --  Net 1025.03 ml   Wt Readings from Last 3 Encounters:  03/10/22 98.7 kg  03/08/22 98.9 kg  03/03/22 99.2 kg    Examination:  Constitutional: NAD Eyes: no scleral icterus ENMT: Mucous membranes are moist.  Neck: normal, supple Respiratory: clear to auscultation bilaterally, no wheezing, no crackles. Normal respiratory effort.  No accessory muscle use.  Cardiovascular: Irregularly irregular, tachycardic Abdomen: non distended, no tenderness. Bowel sounds positive.  Musculoskeletal: no clubbing / cyanosis.  Skin: no rashes Neurologic: Nonfocal   Data Reviewed: I have independently reviewed following labs and imaging studies   CBC Recent Labs  Lab 03/08/22 2246 03/09/22 2037 03/10/22 0401 03/11/22 0356 03/12/22 0642   WBC 3.6* 4.3 5.7 3.4* 3.7*  HGB 9.2* 8.6* 9.1* 8.4* 7.7*  HCT 28.3* 25.5* 28.0* 26.1* 23.7*  PLT 124* 99* 98* 112* 141*  MCV 106.4* 104.5* 108.1* 107.9* 105.8*  MCH 34.6* 35.2* 35.1* 34.7* 34.4*  MCHC 32.5 33.7 32.5 32.2 32.5  RDW 14.2 14.3 14.5 14.6 15.1  LYMPHSABS 0.2* 0.3* 0.3* 0.3* 0.5*  MONOABS 0.5 0.4 0.6 0.3 0.4  EOSABS 0.0 0.0 0.0 0.1 0.2  BASOSABS 0.0 0.0 0.0 0.0 0.0    Recent Labs  Lab 03/08/22 2246 03/08/22 2314 03/09/22 2037 03/10/22 0401 03/11/22 0356 03/11/22 1425 03/12/22 0642 03/12/22 0644  NA 139  --  134* 136 137  --  135  --   K 3.6  --  3.4* 3.6 4.1  --  4.2  --   CL 113*  --  108 110 110  --  110  --   CO2 18*  --  18* 18* 20*  --  19*  --   GLUCOSE 140*  --  106* 100* 115*  --  103*  --   BUN 20  --  $R'19 20 20  'Kc$ --  19  --   CREATININE 1.98*  --  2.01* 1.99* 2.12*  --  2.04*  --   CALCIUM 8.7*  --  7.9* 7.9* 8.0*  --  7.7*  --   AST 17  --  25 30 41  --  31  --   ALT 14  --  $R'15 18 26  'Az$ --  25  --   ALKPHOS 95  --  76 73 70  --  69  --   BILITOT 1.1  --  1.0 1.0 0.7  --  0.7  --   ALBUMIN 3.8  --  3.6 3.4* 3.0*  --  2.9*  --   MG  --   --  2.2 1.8 2.6*  --  2.4  --   PROCALCITON  --   --   --   --   --  1.80 1.48  --   LATICACIDVEN 1.2  --  0.9  --   --   --   --   --   INR  --  1.1 1.1  --   --   --   --   --   TSH  --   --   --   --   --   --   --  3.629    ------------------------------------------------------------------------------------------------------------------ No results for input(s): "CHOL", "HDL", "LDLCALC", "TRIG", "CHOLHDL", "LDLDIRECT" in the last 72 hours.  No results found for: "HGBA1C" ------------------------------------------------------------------------------------------------------------------ Recent Labs    03/12/22 0644  TSH 3.629    Cardiac Enzymes No results for input(s): "CKMB", "TROPONINI", "MYOGLOBIN" in the last 168 hours.  Invalid input(s):  "CK" ------------------------------------------------------------------------------------------------------------------ No results found for: "BNP"  CBG: No results for input(s): "GLUCAP" in the last 168 hours.  Recent Results (from the past 240 hour(s))  SARS Coronavirus 2 by RT PCR (hospital order, performed in [facility] hospital lab) *cepheid single result test* Anterior Nasal Swab     Status: None   Collection Time: 03/08/22  11:13 PM   Specimen: Anterior Nasal Swab  Result Value Ref Range Status   SARS Coronavirus 2 by RT PCR NEGATIVE NEGATIVE Final    Comment: (NOTE) SARS-CoV-2 target nucleic acids are NOT DETECTED.  The SARS-CoV-2 RNA is generally detectable in upper and lower respiratory specimens during the acute phase of infection. The lowest concentration of SARS-CoV-2 viral copies this assay can detect is 250 copies / mL. A negative result does not preclude SARS-CoV-2 infection and should not be used as the sole basis for treatment or other patient management decisions.  A negative result may occur with improper specimen collection / handling, submission of specimen other than nasopharyngeal swab, presence of viral mutation(s) within the areas targeted by this assay, and inadequate number of viral copies (<250 copies / mL). A negative result must be combined with clinical observations, patient history, and epidemiological information.  Fact Sheet for Patients:   https://www.patel.info/  Fact Sheet for Healthcare Providers: https://hall.com/  This test is not yet approved or  cleared by the [place] FDA and has been authorized for detection and/or diagnosis of SARS-CoV-2 by FDA under an Emergency Use Authorization (EUA).  This EUA will remain in effect (meaning this test can be used) for the duration of the COVID-19 declaration under Section 564(b)(1) of the Act, 21 U.S.C. section 360bbb-3(b)(1), unless the  authorization is terminated or revoked sooner.  Performed at [facility], [place] [address], [place], [place] [address]   Blood culture (routine x 2)     Status: None (Preliminary result)   Collection Time: 03/08/22 11:14 PM   Specimen: BLOOD  Result Value Ref Range Status   Specimen Description   Final    BLOOD Performed at [facility] [address], [place], [place] [address]    Special Requests   Final    BOTTLES DRAWN AEROBIC AND ANAEROBIC Blood Culture adequate volume Performed at [facility] [address] [place], [place] [address]    Culture   Final    NO GROWTH 2 DAYS Performed at [facility] [address], [place], [place] [address]    Report Status PENDING  Incomplete  Blood culture (routine x 2)     Status: None (Preliminary result)   Collection Time: 03/08/22 11:50 PM   Specimen: BLOOD  Result Value Ref Range Status   Specimen Description   Final    BLOOD LEFT ANTECUBITAL Performed at [facility] [place] [address], [place], [place] [address]    Special Requests   Final    BOTTLES DRAWN AEROBIC AND ANAEROBIC Blood Culture results may not be optimal due to an excessive volume of blood received in culture bottles Performed at [facility] [address] [place], [place] [address]    Culture   Final    NO GROWTH 2 DAYS Performed at [facility] [address] [place], [place] [address]    Report Status PENDING  Incomplete  Urine Culture     Status: Abnormal   Collection Time: 03/09/22 12:04 AM   Specimen: Urine, Clean Catch  Result Value Ref Range Status   Specimen Description   Final    URINE, CLEAN CATCH Performed at [facility], [place] [address] [place],  [address]    Special Requests   Final    NONE Performed at [facility], [place] [address], [place],  [address]    Culture (A)  Final     <  10,000 COLONIES/mL INSIGNIFICANT GROWTH Performed at [facility], [place] [address] [place], [place] [address]    Report Status 03/10/2022 FINAL  Final  Culture, blood (Routine x 2)     Status: None (Preliminary result)   Collection Time: 03/09/22  8:37 PM   Specimen: BLOOD  Result Value Ref Range Status   Specimen Description   Final    BLOOD LEFT ANTECUBITAL Performed at [facility] [place] [address], [place], [place] [address]    Special Requests   Final    BOTTLES DRAWN AEROBIC AND ANAEROBIC Blood Culture results may not be optimal due to an excessive volume of blood received in culture bottles Performed at [facility] [address], [place], [place] [address]    Culture   Final    NO GROWTH 1 DAY Performed at [facility], [place] [address] [place], [place] [address]    Report Status PENDING  Incomplete  Culture, blood (Routine x 2)     Status: None (Preliminary result)   Collection Time: 03/09/22  8:40 PM   Specimen: BLOOD  Result Value Ref Range Status   Specimen Description   Final    BLOOD BLOOD LEFT HAND Performed at [facility] [address] [place], [place] [address]    Special Requests   Final    BOTTLES DRAWN AEROBIC AND ANAEROBIC Blood Culture results may not be optimal due to an inadequate volume of blood received in culture bottles Performed at [facility] [address], [place], [place] [address]    Culture   Final    NO GROWTH 1 DAY Performed at [facility], [place] [address] [place], [place] [address]    Report Status PENDING  Incomplete  MRSA Next Gen by PCR, Nasal     Status: None   Collection Time: 03/10/22 12:17 PM   Specimen: Nasal Mucosa; Nasal Swab  Result Value Ref Range Status   MRSA by PCR Next Gen NOT DETECTED NOT DETECTED Final    Comment: (NOTE) The GeneXpert MRSA Assay (FDA approved for NASAL specimens only), is one component of a  comprehensive MRSA colonization surveillance program. It is not intended to diagnose MRSA infection nor to guide or monitor treatment for MRSA infections. Test performance is not FDA approved in patients less than 29 years old. Performed at [facility], [place] [address] [place], [place] [address]   Respiratory (~20 pathogens) panel by PCR     Status: None   Collection Time: 03/11/22  1:21 PM   Specimen: Nasopharyngeal Swab; Respiratory  Result Value Ref Range Status   Adenovirus NOT DETECTED NOT DETECTED Final   Coronavirus 229E NOT DETECTED NOT DETECTED Final    Comment: (NOTE) The Coronavirus on the Respiratory Panel, DOES NOT test for the novel  Coronavirus (2019 nCoV)    Coronavirus HKU1 NOT DETECTED NOT DETECTED Final   Coronavirus NL63 NOT DETECTED NOT DETECTED Final   Coronavirus OC43 NOT DETECTED NOT DETECTED Final   Metapneumovirus NOT DETECTED NOT DETECTED Final   Rhinovirus / Enterovirus NOT DETECTED NOT DETECTED Final   Influenza A NOT DETECTED NOT DETECTED Final   Influenza B NOT DETECTED NOT DETECTED Final   Parainfluenza Virus 1 NOT DETECTED NOT DETECTED Final   Parainfluenza Virus 2 NOT DETECTED NOT DETECTED Final   Parainfluenza Virus 3 NOT DETECTED NOT DETECTED Final   Parainfluenza Virus 4 NOT DETECTED NOT DETECTED Final   Respiratory  Syncytial Virus NOT DETECTED NOT DETECTED Final   Bordetella pertussis NOT DETECTED NOT DETECTED Final   Bordetella Parapertussis NOT DETECTED NOT DETECTED Final   Chlamydophila pneumoniae NOT DETECTED NOT DETECTED Final   Mycoplasma pneumoniae NOT DETECTED NOT DETECTED Final    Comment: Performed at [facility], [place] [address] [place], [place] [address]     Radiology Studies: DG CHEST PORT 1 VIEW  Result Date: 03/12/2022 CLINICAL DATA:  Shortness of breath. EXAM: PORTABLE CHEST 1 VIEW COMPARISON:  03/11/2022. FINDINGS: Similar mild interstitial and patchy airspace opacities in the left lung. Similar  streaky opacities at the right lung base. No visible pleural effusions or pneumothorax. No acute osseous abnormality. Polyarticular degenerative change. IMPRESSION: 1. Similar mild interstitial and patchy airspace opacities in the left lung which could represent asymmetric edema or infection. 2. Similar mild streaky right basilar opacities, which could represent atelectasis and/or pneumonia. Electronically Signed   By: Margaretha Sheffield M.D.   On: 03/12/2022 08:08   VAS [place] LOWER EXTREMITY VENOUS (DVT)  Result Date: 03/12/2022  Lower Venous DVT Study Patient Name:  Darren Hinton.  Date of Exam:   03/11/2022 Medical Rec #: [id number]            Accession #:    [id number] Date of Birth: 16-Jun-1946           Patient Gender: M Patient Age:   76 years Exam Location:  [facility] Procedure:      VAS [place] LOWER EXTREMITY VENOUS (DVT) Referring Phys: Raiford Noble --------------------------------------------------------------------------------  Indications: Swelling.  Comparison Study: No prior studies. Performing Technologist: Darlin Coco RDMS, RVT  Examination Guidelines: A complete evaluation includes B-mode imaging, spectral Doppler, color Doppler, and power Doppler as needed of all accessible portions of each vessel. Bilateral testing is considered an integral part of a complete examination. Limited examinations for reoccurring indications may be performed as noted. The reflux portion of the exam is performed with the patient in reverse Trendelenburg.  +---------+---------------+---------+-----------+----------+--------------+ RIGHT    CompressibilityPhasicitySpontaneityPropertiesThrombus Aging +---------+---------------+---------+-----------+----------+--------------+ CFV      Full           Yes      Yes                                 +---------+---------------+---------+-----------+----------+--------------+ SFJ      Full                                                         +---------+---------------+---------+-----------+----------+--------------+ FV Prox  Full                                                        +---------+---------------+---------+-----------+----------+--------------+ FV Mid   Full                                                        +---------+---------------+---------+-----------+----------+--------------+ FV DistalFull                                                        +---------+---------------+---------+-----------+----------+--------------+  PFV      Full                                                        +---------+---------------+---------+-----------+----------+--------------+ POP      Full           Yes      Yes                                 +---------+---------------+---------+-----------+----------+--------------+ PTV      Full                                                        +---------+---------------+---------+-----------+----------+--------------+ PERO     Full                                                        +---------+---------------+---------+-----------+----------+--------------+   +---------+---------------+---------+-----------+----------+--------------+ LEFT     CompressibilityPhasicitySpontaneityPropertiesThrombus Aging +---------+---------------+---------+-----------+----------+--------------+ CFV      Full           Yes      Yes                                 +---------+---------------+---------+-----------+----------+--------------+ SFJ      Full                                                        +---------+---------------+---------+-----------+----------+--------------+ FV Prox  Full                                                        +---------+---------------+---------+-----------+----------+--------------+ FV Mid   Full                                                         +---------+---------------+---------+-----------+----------+--------------+ FV DistalFull                                                        +---------+---------------+---------+-----------+----------+--------------+ PFV      Full                                                        +---------+---------------+---------+-----------+----------+--------------+  POP      Full           Yes      Yes                                 +---------+---------------+---------+-----------+----------+--------------+ PTV      Full                                                        +---------+---------------+---------+-----------+----------+--------------+ PERO     Full                                                        +---------+---------------+---------+-----------+----------+--------------+     Summary: RIGHT: - There is no evidence of deep vein thrombosis in the lower extremity.  - No cystic structure found in the popliteal fossa.  LEFT: - There is no evidence of deep vein thrombosis in the lower extremity.  - No cystic structure found in the popliteal fossa.  *See table(s) above for measurements and observations. Electronically signed by Jamelle Haring on 03/12/2022 at 2:07:01 AM.    Final      Marzetta Board, MD, PhD [facility]  Between 7 am - 7 pm I am available, please contact me via Amion (for emergencies) or Securechat (non urgent messages)  Between 7 pm - 7 am I am not available, please contact night coverage MD/APP via Amion

## 2022-03-12 NOTE — Consult Note (Addendum)
Cardiology Consultation:   Patient ID: Darren Hinton. MRN: [id number]; DOB: [birth year]  Admit date: 03/09/2022 Date of Consult: 03/12/2022  PCP:  Darren Lopes, MD   [facility] Providers Cardiologist:  new to [facility] -remotely followed by Darren Hinton   Patient Profile:   Darren Hinton. is a 76 y.o. male with a hx of multiple myeloma recently started on chemotherapy, PAF, hypertension, hyperlipidemia, hypothyroidism, anxiety and depression who is being seen 03/12/2022 for the evaluation of atrial flutter with RVR at the request of Darren Hinton.  History of Present Illness:   Darren Hinton is a pleasant 76 year old veteran with past medical history of multiple myeloma recently started on chemotherapy, PAF, hypertension, hyperlipidemia, hypothyroidism, anxiety and depression.  According to the patient, he was first diagnosed with A-fib around Nov 29, 2007 when his father died.  The last time he was seen in the emergency room for palpitation was on 03/12/2014.  EKG at this time showed a narrow complex tachycardia.  It was felt the patient likely was in atrial fibrillation with RVR, although it is difficult to differentiate between A-fib, atrial flutter with RVR and SVT.  His Cardizem was increased and he was told to follow-up with his cardiologist as outpatient.  He says Darren Hinton later increased his metoprolol to succinate from the previous 50 mg daily to 100 mg daily.  He says he was on warfarin for short period of time, however he was later taken off of warfarin as it was not confident that he truly had A-fib.  He says he has not seen by Darren Hinton for at least 3 years or longer.  He presented to [facility] on 03/08/2022 with fever.  EKG at the time showed normal sinus rhythm.  Temperature was one 2.9 on arrival.  Blood culture was obtained.  He was treated with Omnicef.  He was discharged from the emergency room however returned at the same night with persistent fever of 102.4.   Temperature was one 2.8 in the ED.  He has recently started on chemotherapy for multiple myeloma and so far received 4 regimen.  After the third regimen, he was having diarrhea and had persistent fatigue.  Due to fever of unknown origin, he was admitted to hospitalist service.  UA was negative.  Initial chest x-ray obtained on 7/1 was negative, however repeat chest x-ray obtained on 03/11/2022 showed interval development movement of interstitial opacity in the left mid to lower lung concerning for atypical/viral infection.  Streaky right basilar opacity which may represent atelectasis versus developing infection.  He is currently receiving IV antibiotic.  According to admission note on 03/09/2022, his heart rate was 120s on arrival.  No EKG was obtained on 7/2, therefore I am unable to confirm the rhythm.  He was not on anticoagulation therapy.  At home, he is on metoprolol succinate 100 mg daily.  Due to borderline low blood pressure, he is currently on metoprolol succinate 12.5 mg daily.  IV amiodarone has been added for rate control.  Interestingly, patient does not have any cardiac awareness of atrial flutter.   Past Medical History:  Diagnosis Date   Allergy    Anxiety    Arthritis    Depression    Heart murmur    Hyperlipidemia    Hyperplastic colon polyp    Hyperproteinemia    Hypertension    Hypothyroidism    Mitral valve prolapse    Paroxysmal atrial fibrillation (HCC)    Thyroid disease  History reviewed. No pertinent surgical history.   Home Medications:  Prior to Admission medications   Medication Sig Start Date End Date Taking? Authorizing Provider  cefdinir (OMNICEF) 300 MG capsule Take 1 capsule (300 mg total) by mouth 2 (two) times daily. 03/09/22  Yes Hinton, Darren Allegra, MD  acyclovir (ZOVIRAX) 400 MG tablet Take 1 tablet (400 mg total) by mouth 2 (two) times daily. 01/30/22   Darren Slick, MD  allopurinol (ZYLOPRIM) 300 MG tablet Take 1 tablet (300 mg total) by mouth  daily. 01/30/22   Darren Slick, MD  ALPRAZolam Duanne Moron) 0.25 MG tablet Take 0.25 mg by mouth daily as needed. Anxiety     Darren Lopes, MD  amLODipine (NORVASC) 5 MG tablet Take 5 mg by mouth daily. 10/24/14   [provider]  atorvastatin (LIPITOR) 20 MG tablet Take 20 mg by mouth daily.    [provider]  dexamethasone (DECADRON) 4 MG tablet Take 10 tablets (40 mg total) by mouth once a week. Take steroids pills the in the morning on chemotherapy days. 01/30/22   Darren Slick, MD  ergocalciferol (VITAMIN D2) 1.25 MG (50000 UT) capsule Take 50,000 Units by mouth every Wednesday. 10/27/20   [provider]  fluticasone (FLONASE) 50 MCG/ACT nasal spray Place 2 sprays into the nose daily as needed. Allergies     [provider]  levothyroxine (SYNTHROID) 100 MCG tablet Take 100 mcg by mouth daily before breakfast. 10/14/21   [provider]  loratadine (CLARITIN) 10 MG tablet Take 10 mg by mouth daily. 01/04/20   [provider]  losartan-hydrochlorothiazide (HYZAAR) 50-12.5 MG tablet Take 1 tablet by mouth daily. 01/31/22   [provider]  meclizine (ANTIVERT) 25 MG tablet Take 1 tablet (25 mg total) by mouth 3 (three) times daily as needed for dizziness. 03/20/13   Darren Knapp, MD  metoprolol succinate (TOPROL-XL) 100 MG 24 hr tablet Take 100 mg by mouth daily. Take with or immediately following a meal.    [provider]  ondansetron (ZOFRAN) 8 MG tablet Take 1 tablet (8 mg total) by mouth every 8 (eight) hours as needed. 01/30/22   Darren Slick, MD  prochlorperazine (COMPAZINE) 10 MG tablet Take 1 tablet (10 mg total) by mouth every 6 (six) hours as needed for nausea or vomiting. 01/30/22   Darren Slick, MD  senna-docusate (SENOKOT-S) 8.6-50 MG tablet Take 1 tablet by mouth at bedtime as needed for mild constipation. 11/26/21   [provider]  vitamin B-12 (CYANOCOBALAMIN) 500 MCG tablet Take 1 tablet  by mouth daily. 07/19/15   [provider]    Inpatient Medications: Scheduled Meds:  guaiFENesin  1,200 mg Oral BID   levothyroxine  100 mcg Oral QAC breakfast   metoprolol succinate  12.5 mg Oral Daily   Continuous Infusions:  amiodarone 30 mg/hr (03/12/22 0211)   ceFEPime (MAXIPIME) IV 2 g (03/12/22 0758)   heparin 1,300 Units/hr (03/12/22 0751)   PRN Meds: acetaminophen, HYDROmorphone (DILAUDID) injection, melatonin, ondansetron (ZOFRAN) IV, mouth rinse, oxyCODONE, polyethylene glycol  Allergies:    Allergies  Allergen Reactions   Zithromax [Azithromycin] Other (See Comments)    Social History:   Social History   Socioeconomic History   Marital status: Married    Spouse name: Not on file   Number of children: Not on file   Years of education: Not on file   Highest education level: Not on file  Occupational History   Not on file  Tobacco Use   Smoking status: Former    Years: 20.00    Types: Cigarettes    Quit date: 10/01/1978    Years since quitting: 43.4   Smokeless tobacco: Never  Substance and Sexual Activity   Alcohol use: Yes    Alcohol/week: 2.0 standard drinks of alcohol    Types: 2 Glasses of wine per week    Comment: occasionally   Drug use: No   Sexual activity: Not on file  Other Topics Concern   Not on file  Social History Narrative   Not on file   Social Determinants of Health   Financial Resource Strain: Not on file  Food Insecurity: Not on file  Transportation Needs: Not on file  Physical Activity: Not on file  Stress: Not on file  Social Connections: Not on file  Intimate Partner Violence: Not on file    Family History:    Family History  Problem Relation Age of Onset   Cancer Father    Colon cancer Neg Hx    Esophageal cancer Neg Hx    Liver cancer Neg Hx    Pancreatic cancer Neg Hx    Rectal cancer Neg Hx    Stomach cancer Neg Hx      ROS:  Please see the history of present illness.   All other ROS reviewed  and negative.     Physical Exam/Data:   Vitals:   03/12/22 0628 03/12/22 0700 03/12/22 0752 03/12/22 0800  BP: 99/71 100/82 100/73 101/88  Pulse: 100  (!) 115 (!) 125  Resp: $Remo'20 20 20   'kzlGe$ Temp: 99.4 F (37.4 C)  99.1 F (37.3 C)   TempSrc: Oral  Oral   SpO2: 98% 100% 99%   Weight:      Height:        Intake/Output Summary (Last 24 hours) at 03/12/2022 0857 Last data filed at 03/11/2022 2030 Gross per 24 hour  Intake 1225.03 ml  Output --  Net 1225.03 ml      03/10/2022   12:20 AM 03/09/2022    8:29 PM 03/08/2022   10:17 PM  Last 3 Weights  Weight (lbs) 217 lb 9.5 oz 218 lb 218 lb  Weight (kg) 98.7 kg 98.884 kg 98.884 kg     Body mass index is 25.8 kg/m.  General:  Well nourished, well developed, in no acute distress HEENT: normal Neck: no JVD Vascular: No carotid bruits; Distal pulses 2+ bilaterally Cardiac:  normal S1, S2; tachycardic; no murmur  Lungs:  clear to auscultation bilaterally, no wheezing, rhonchi or rales  Abd: soft, nontender, no hepatomegaly  Ext: no edema Musculoskeletal:  No deformities, BUE and BLE strength normal and equal Skin: warm and dry  Neuro:  CNs 2-12 intact, no focal abnormalities noted Psych:  Normal affect   EKG:  The EKG was personally reviewed and demonstrates: Atrial flutter with RVR Telemetry:  Telemetry was personally reviewed and demonstrates: Atrial flutter with RVR with heart rate in the 110s to 140s range  Relevant CV Studies:  N/A  Laboratory Data:  High Sensitivity Troponin:   Recent Labs  Lab 03/11/22 1422 03/11/22 1555  TROPONINIHS 22* 21*     Chemistry Recent Labs  Lab 03/10/22 0401 03/11/22 0356 03/12/22 0642  NA 136 137 135  K 3.6 4.1 4.2  CL 110 110 110  CO2 18* 20* 19*  GLUCOSE 100* 115* 103*  BUN $Re'20 20 19  'ybe$ CREATININE 1.99* 2.12* 2.04*  CALCIUM 7.9* 8.0* 7.7*  MG 1.8 2.6* 2.4  GFRNONAA 34* 32* 33*  ANIONGAP $RemoveB'8 7 6    'adDOaCar$ Recent Labs  Lab 03/10/22 0401 03/11/22 0356 03/12/22 0642  PROT 6.4* 6.1* 5.8*   ALBUMIN 3.4* 3.0* 2.9*  AST 30 41 31  ALT $Re'18 26 25  'dOp$ ALKPHOS 73 70 69  BILITOT 1.0 0.7 0.7   Lipids No results for input(s): "CHOL", "TRIG", "HDL", "LABVLDL", "LDLCALC", "CHOLHDL" in the last 168 hours.  Hematology Recent Labs  Lab 03/10/22 0401 03/11/22 0356 03/12/22 0642 03/12/22 0644  WBC 5.7 3.4* 3.7*  --   RBC 2.59* 2.42* 2.24* 2.30*  HGB 9.1* 8.4* 7.7*  --   HCT 28.0* 26.1* 23.7*  --   MCV 108.1* 107.9* 105.8*  --   MCH 35.1* 34.7* 34.4*  --   MCHC 32.5 32.2 32.5  --   RDW 14.5 14.6 15.1  --   PLT 98* 112* 141*  --    Thyroid  Recent Labs  Lab 03/12/22 0644  TSH 3.629    BNPNo results for input(s): "BNP", "PROBNP" in the last 168 hours.  DDimer No results for input(s): "DDIMER" in the last 168 hours.   Radiology/Studies:  DG CHEST PORT 1 VIEW  Result Date: 03/12/2022 CLINICAL DATA:  Shortness of breath. EXAM: PORTABLE CHEST 1 VIEW COMPARISON:  03/11/2022. FINDINGS: Similar mild interstitial and patchy airspace opacities in the left lung. Similar streaky opacities at the right lung base. No visible pleural effusions or pneumothorax. No acute osseous abnormality. Polyarticular degenerative change. IMPRESSION: 1. Similar mild interstitial and patchy airspace opacities in the left lung which could represent asymmetric edema or infection. 2. Similar mild streaky right basilar opacities, which could represent atelectasis and/or pneumonia. Electronically Signed   By: Margaretha Sheffield M.D.   On: 03/12/2022 08:08   VAS [place] LOWER EXTREMITY VENOUS (DVT)  Result Date: 03/12/2022  Lower Venous DVT Study Patient Name:  Darren Hinton.  Date of Exam:   03/11/2022 Medical Rec #: [id number]            Accession #:    [id number] Date of Birth: 27-Apr-1946           Patient Gender: M Patient Age:   49 years Exam Location:  [facility] Procedure:      VAS [place] LOWER EXTREMITY VENOUS (DVT) Referring Phys: Raiford Noble  --------------------------------------------------------------------------------  Indications: Swelling.  Comparison Study: No prior studies. Performing Technologist: Darlin Coco RDMS, RVT  Examination Guidelines: A complete evaluation includes B-mode imaging, spectral Doppler, color Doppler, and power Doppler as needed of all accessible portions of each vessel. Bilateral testing is considered an integral part of a complete examination. Limited examinations for reoccurring indications may be performed as noted. The reflux portion of the exam is performed with the patient in reverse Trendelenburg.  +---------+---------------+---------+-----------+----------+--------------+ RIGHT    CompressibilityPhasicitySpontaneityPropertiesThrombus Aging +---------+---------------+---------+-----------+----------+--------------+ CFV      Full           Yes      Yes                                 +---------+---------------+---------+-----------+----------+--------------+ SFJ      Full                                                        +---------+---------------+---------+-----------+----------+--------------+  FV Prox  Full                                                        +---------+---------------+---------+-----------+----------+--------------+ FV Mid   Full                                                        +---------+---------------+---------+-----------+----------+--------------+ FV DistalFull                                                        +---------+---------------+---------+-----------+----------+--------------+ PFV      Full                                                        +---------+---------------+---------+-----------+----------+--------------+ POP      Full           Yes      Yes                                 +---------+---------------+---------+-----------+----------+--------------+ PTV      Full                                                         +---------+---------------+---------+-----------+----------+--------------+ PERO     Full                                                        +---------+---------------+---------+-----------+----------+--------------+   +---------+---------------+---------+-----------+----------+--------------+ LEFT     CompressibilityPhasicitySpontaneityPropertiesThrombus Aging +---------+---------------+---------+-----------+----------+--------------+ CFV      Full           Yes      Yes                                 +---------+---------------+---------+-----------+----------+--------------+ SFJ      Full                                                        +---------+---------------+---------+-----------+----------+--------------+ FV Prox  Full                                                        +---------+---------------+---------+-----------+----------+--------------+  FV Mid   Full                                                        +---------+---------------+---------+-----------+----------+--------------+ FV DistalFull                                                        +---------+---------------+---------+-----------+----------+--------------+ PFV      Full                                                        +---------+---------------+---------+-----------+----------+--------------+ POP      Full           Yes      Yes                                 +---------+---------------+---------+-----------+----------+--------------+ PTV      Full                                                        +---------+---------------+---------+-----------+----------+--------------+ PERO     Full                                                        +---------+---------------+---------+-----------+----------+--------------+     Summary: RIGHT: - There is no evidence of deep vein thrombosis in the lower extremity.  - No  cystic structure found in the popliteal fossa.  LEFT: - There is no evidence of deep vein thrombosis in the lower extremity.  - No cystic structure found in the popliteal fossa.  *See table(s) above for measurements and observations. Electronically signed by Jamelle Haring on 03/12/2022 at 2:07:01 AM.    Final    DG CHEST PORT 1 VIEW  Result Date: 03/11/2022 CLINICAL DATA:  Shortness of breath EXAM: PORTABLE CHEST 1 VIEW COMPARISON:  03/08/2022 FINDINGS: Heart size within normal limits. Interval development of interstitial opacities within the left mid to lower lung. Elevation of the right hemidiaphragm with streaky right basilar opacity. No pleural effusion or pneumothorax. IMPRESSION: 1. Interval development of interstitial opacities within the left mid to lower lung concerning for atypical/viral infection. 2. Streaky right basilar opacity may represent atelectasis versus developing infection. Electronically Signed   By: Davina Poke D.O.   On: 03/11/2022 10:37   DG Chest Port 1 View  Result Date: 03/08/2022 CLINICAL DATA:  Questionable sepsis - evaluate for abnormality Fever.  Chemotherapy, most recently Monday. EXAM: PORTABLE CHEST 1 VIEW COMPARISON:  Chest radiograph 03/12/2014 FINDINGS: The heart is normal in size. Streaky opacity at the right lung base is unchanged from prior exam and most  consistent with scarring. No evidence of acute airspace disease. No pulmonary edema, pleural effusion, or pneumothorax. No acute osseous abnormalities are seen on this portable exam, patient with history of myeloma. IMPRESSION: No acute chest findings. Unchanged scarring at the right lung base. Electronically Signed   By: Keith Rake M.D.   On: 03/08/2022 23:45     Assessment and Plan:   Paroxysmal atrial flutter with RVR -Patient previously has a history of atrial fibrillation however was not on anticoagulation therapy.  He says he was placed on a short course of warfarin however was later taken off.   Previous abnormal EKG was from July 2015 and obtained in the emergency room, patient was supposed to follow-up with cardiology service Darren Hinton as outpatient. - I contacted Dr. Irven Shelling office and spoke with his staff, per staff, discussed with Darren Hinton, since patient has not been since in more than 3 years, he is considered a new patient, we should see the consult. After discharge, will defer to the patient who to follow up.  -He presented to the ED on 03/08/2022 with fever, EKG at the time showed sinus rhythm.  He was given antibiotic and was discharged around 3AM on 7/2.  He returned the same night with persistent fever of unknown origin.  No EKG was obtained on 7/2, however admission note mentioned that his heart rate was 120s when he returned.  I suspect he likely has went into atrial flutter with RVR on 03/09/2022.  -On 100 mg daily of Toprol-XL at home.  Home Toprol-XL currently on hold due to borderline blood pressure.  Started on IV amiodarone, currently on 12.5 mg daily of Toprol-XL.  Systolic blood pressure high 90s to low 100 range.  I suspect the patient should be able to tolerate at least 25 mg daily of Toprol-XL.  Will discuss with MD, continue on IV amiodarone, potentially set up for TEE DCCV for this Friday.  -Persistent anemia will make long term anticoagulation difficult, but fortunately he has never required blood transfusion before. He obtain his medication from [place]  Anemia: Hemoglobin 7.7 this morning.  Patient denies any bleeding issues such as blood in the stool or in the urine.  He has never received a blood transfusion in the past.  Anemia panel shows low iron, low TIBC and the low saturation ratio, elevated ferritin.  Fever of unknown origin: Chest x-ray shows possible atypical infection.  Currently on IV antibiotic, cefepime.  Denies significant productive cough.  Multiple myeloma: Recently started on chemotherapy.  Hypertension: Home antihypertensive has been stopped, currently  on Toprol-XL 12.5 mg daily.  Titration of rate control medication has been limited by borderline low blood pressure.  We will increase Toprol-XL to 25 mg daily.  Hyperlipidemia  Hypothyroidism   Risk Assessment/Risk Scores:          CHA2DS2-VASc Score = 3   This indicates a 3.2% annual risk of stroke. The patient's score is based upon: CHF History: 0 HTN History: 1 Diabetes History: 0 Stroke History: 0 Vascular Disease History: 0 Age Score: 2 Gender Score: 0         For questions or updates, please contact [place] Please consult [email or web address] for contact info under    Signed, Almyra Deforest, [place]  03/12/2022 8:57 AM  I have seen and examined the patient along with Almyra Deforest, PA .  I have reviewed the chart, notes and new data.  I agree with PA/NP's note.  Key new complaints: Completely unaware of  arrhythmia.  Denies shortness of breath, but has a lot of problems with fatigue, lack of energy.  No chest pain.  Has not had syncope. Key examination changes: Rapid irregular rhythm, otherwise normal cardiovascular examination.  Clear lungs. Key new findings / data: On 03/08/2022 ECG showed normal sinus rhythm with anterior precordial T wave inversions.  Currently, ECG and telemetry show atrial fibrillation rapid ventricular response, frequent with heart rate in the 140s despite intravenous amiodarone and increasing dose of metoprolol (at home he was taking 100 mg of metoprolol succinate daily, reduced due to hypotension at admission). Colonoscopy with Dr. Havery Moros in 2019: "A single small angiodysplastic lesion was found in the ascending colon. - Redundant colon. - Internal hemorrhoids. - The examination was otherwise normal. - No polyps"  PLAN: Waiting for echocardiogram, but his physical exam does not suggest serious structural heart disease Continue IV amiodarone and gradually increase the metoprolol back to his home dose.  Reportedly was intolerant of diltiazem in the  past.  Renal dysfunction makes use of digoxin very undesirable. If we cannot achieve good rate control we will have to schedule for TEE cardioversion later this week.  If good rate control was achieved we can plan an outpatient elective cardioversion after about 3 weeks of uninterrupted anticoagulation. I discussed the purpose of TEE and DCCV, including anesthesiology assisted deep sedation, risks and benefits, possible complications in detail with the patient and his wife.  They understand and agree to go ahead with these procedures if they are needed. He has multifactorial anemia but also clear evidence of iron deficiency with serum iron of 10 and transferrin saturation of only 5% (ferritin artifactually high in setting of acute inflammatory illness).    He has not had hematemesis or melena.  He had a colonoscopy with febrile results in 2019.  He will need replenishment of iron stores, preferably with an infusion of intravenous iron, would repeat Hemoccult and may need outpatient EGD. Monitor CBC closely, but he has a high embolic risk and I recommend starting treatment with a direct oral anticoagulant, preferably Eliquis 5 mg twice daily for its better GI bleeding risk profile.    Sanda Klein, MD, [place] [phone] 03/12/2022, 1:16 PM

## 2022-03-12 NOTE — Progress Notes (Signed)
ANTICOAGULATION CONSULT NOTE  Pharmacy Consult for Heparin Indication: atrial fibrillation  Allergies  Allergen Reactions   Zithromax [Azithromycin] Other (See Comments)    Patient Measurements: Height: _0  (195.6 cm) Weight: 98.7 kg (217 lb 9.5 oz) IBW/kg (Calculated) : 89.1 Heparin Dosing Weight:  98.7 kg  Vital Signs: Temp: 99.1 F (37.3 C) (07/05 0752) Temp Source: Oral (07/05 0752) BP: 102/84 (07/05 0917) Pulse Rate: 129 (07/05 0917)  Labs: Recent Labs    03/09/22 2037 03/10/22 0401 03/11/22 0356 03/11/22 1422 03/11/22 1555 03/11/22 2317 03/12/22 0642 03/12/22 0644  HGB 8.6* 9.1* 8.4*  --   --   --  7.7*  --   HCT 25.5* 28.0* 26.1*  --   --   --  23.7*  --   PLT 99* 98* 112*  --   --   --  141*  --   LABPROT 14.5  --   --   --   --   --   --   --   INR 1.1  --   --   --   --   --   --   --   HEPARINUNFRC  --   --   --   --   --  0.48  --  0.54  CREATININE 2.01* 1.99* 2.12*  --   --   --  2.04*  --   TROPONINIHS  --   --   --  22* 21*  --   --   --      Estimated Creatinine Clearance: 38.8 mL/min (A) (by C-G formula based on SCr of 2.04 mg/dL (H)).   Medical History: Past Medical History:  Diagnosis Date   Allergy    Anxiety    Arthritis    Depression    Heart murmur    Hyperlipidemia    Hyperplastic colon polyp    Hyperproteinemia    Hypertension    Hypothyroidism    Mitral valve prolapse    Paroxysmal atrial fibrillation (HCC)    Thyroid disease    Assessment: Active Problem(s): FUO. Mild dysuria, Afib  PMH: Multiple myeloma on recent chemotherapy (03/03/22), paroxysmal A-fib not oral anticoagulated, essential hypertension, hyperlipidemia, hypothyroidism, mitral valve prolapse, hypothyroidism, chronic anxiety/depression, CKD3  AC/Heme: No anticoag PTA. Start IV heparin for afib.  Baseline CBC: Hgb 8.4. Plts only 112. CHA2DS2-VASc score of 3  03/12/2022: Heparin level 0.54- therapeutic on IV heparin 1300 units/hr Hgb trending down, no  noted bleeding issues per RN  No infusion related concerns reported by RN  Goal of Therapy:  Heparin level 0.3-0.7 units/ml Monitor platelets by anticoagulation protocol: Yes   Plan:  Continue IV heparin 1300 units/hr Daily HL and CBC- monitor closely for thrombocytopenia  Royetta Asal, PharmD, BCPS 03/12/2022 9:19 AM

## 2022-03-12 NOTE — Evaluation (Signed)
Physical Therapy Evaluation Patient Details Name: Darren Hinton. MRN: [id number] DOB: [birth year] Today's Date: 03/12/2022  History of Present Illness  76 y.o. male with medical history significant for multiple myeloma on recent chemotherapy (03/03/22), paroxysmal A-fib not oral anticoagulated, essential hypertension, hyperlipidemia, hypothyroidism, mitral valve prolapse, hypothyroidism, chronic anxiety/depression who presented to [facility] ED from home with complaints of having recurrent subjective fevers with Tmax 102 at home. Dx: fever of unclear etiology.  Clinical Impression  Pt admitted with above diagnosis. Pt ambulated 10' with RW, distance limited by therapist 2* HR up to 151 with minimal activity. I expect he will be able to DC home and return to his baseline of ambulating with a cane or rollator upon acute DC.  Pt currently with functional limitations due to the deficits listed below (see PT Problem List). Pt will benefit from skilled PT to increase their independence and safety with mobility to allow discharge to the venue listed below.          Recommendations for follow up therapy are one component of a multi-disciplinary discharge planning process, led by the attending physician.  Recommendations may be updated based on patient status, additional functional criteria and insurance authorization.  Follow Up Recommendations No PT follow up      Assistance Recommended at Discharge PRN  Patient can return home with the following  A little help with bathing/dressing/bathroom;Assist for transportation;Assistance with cooking/housework    Equipment Recommendations None recommended by PT  Recommendations for Other Services       Functional Status Assessment Patient has had a recent decline in their functional status and demonstrates the ability to make significant improvements in function in a reasonable and predictable amount of time.     Precautions / Restrictions  Precautions Precautions: Other (comment) Precaution Comments: monitor HR Restrictions Weight Bearing Restrictions: No      Mobility  Bed Mobility Overal bed mobility: Modified Independent             General bed mobility comments: HOB up, used rail    Transfers Overall transfer level: Needs assistance Equipment used: Rolling walker (2 wheels) Transfers: Sit to/from Stand Sit to Stand: From elevated surface, Supervision           General transfer comment: VCs for hand placement, from elevated surface    Ambulation/Gait Ambulation/Gait assistance: Supervision Gait Distance (Feet): 10 Feet Assistive device: Rolling walker (2 wheels) Gait Pattern/deviations: WFL(Within Functional Limits) Gait velocity: WFL     General Gait Details: steady, no loss of balance, HR up to 151 with sit to stand so just walked around foot of bed to recliner, HR 90s-140s at rest, RN/MD aware, pt reported lightheadedness in standing, reports chronic back pain with standing  Stairs            Wheelchair Mobility    Modified Rankin (Stroke Patients Only)       Balance Overall balance assessment: Modified Independent                                           Pertinent Vitals/Pain Pain Assessment Pain Assessment: No/denies pain    Home Living Family/patient expects to be discharged to:: Private residence Living Arrangements: Spouse/significant other Available Help at Discharge: Family;Available 24 hours/day Type of Home: House Home Access: Stairs to enter Entrance Stairs-Rails: Left Entrance Stairs-Number of Steps: 2   Home Layout: One level Home  Equipment: Rollator (4 wheels);Cane - single point;Shower seat;Grab bars - tub/shower      Prior Function Prior Level of Function : Independent/Modified Independent;Driving             Mobility Comments: used cane in home, rollator prn esp bc he can't stand long due to "bulging discs in the back", no  falls in past 6 months but some stumbling recently due to weakness from this illness ADLs Comments: independent     Hand Dominance        Extremity/Trunk Assessment   Upper Extremity Assessment Upper Extremity Assessment: Defer to OT evaluation    Lower Extremity Assessment Lower Extremity Assessment: Overall WFL for tasks assessed    Cervical / Trunk Assessment Cervical / Trunk Assessment: Normal  Communication   Communication: No difficulties  Cognition Arousal/Alertness: Awake/alert Behavior During Therapy: WFL for tasks assessed/performed Overall Cognitive Status: Within Functional Limits for tasks assessed                                          General Comments      Exercises     Assessment/Plan    PT Assessment Patient needs continued PT services  PT Problem List Cardiopulmonary status limiting activity;Decreased activity tolerance       PT Treatment Interventions Gait training;Therapeutic exercise    PT Goals (Current goals can be found in the Care Plan section)  Acute Rehab PT Goals Patient Stated Goal: return to independence PT Goal Formulation: With patient/family Time For Goal Achievement: 03/26/22 Potential to Achieve Goals: Good    Frequency Min 3X/week     Co-evaluation               AM-PAC PT "6 Clicks" Mobility  Outcome Measure Help needed turning from your back to your side while in a flat bed without using bedrails?: None Help needed moving from lying on your back to sitting on the side of a flat bed without using bedrails?: A Little Help needed moving to and from a bed to a chair (including a wheelchair)?: None Help needed standing up from a chair using your arms (e.g., wheelchair or bedside chair)?: None Help needed to walk in hospital room?: None Help needed climbing 3-5 steps with a railing? : A Little 6 Click Score: 22    End of Session Equipment Utilized During Treatment: Gait belt Activity Tolerance:  Treatment limited secondary to medical complications (Comment) (HR 151 with activity) Patient left: in chair;with call bell/phone within reach;with family/visitor present Nurse Communication: Mobility status PT Visit Diagnosis: Difficulty in walking, not elsewhere classified (R26.2)    Time: 7622-6333 PT Time Calculation (min) (ACUTE ONLY): 24 min   Charges:   PT Evaluation $PT Eval Moderate Complexity: 1 Mod PT Treatments $Gait Training: 8-22 mins       Blondell Reveal Kistler PT 03/12/2022  Acute Rehabilitation Services  Office [phone]

## 2022-03-12 NOTE — Progress Notes (Signed)
OT Cancellation Note  Patient Details Name: Darren Hinton. MRN: [id number] DOB: 12-24-1945   Cancelled Treatment:    Reason Eval/Treat Not Completed: Medical issues which prohibited therapy. Pt sitting up in recliner upon  arrival of OT. HR frequently running into 140s at rest. Will hold for today and reattempt tomorrow.   Tyrone Schimke, OT Acute Rehabilitation Services Office: [phone]   Hortencia Pilar 03/12/2022, 12:28 PM

## 2022-03-13 DIAGNOSIS — I4891 Unspecified atrial fibrillation: Secondary | ICD-10-CM | POA: Diagnosis not present

## 2022-03-13 DIAGNOSIS — R6510 Systemic inflammatory response syndrome (SIRS) of non-infectious origin without acute organ dysfunction: Secondary | ICD-10-CM

## 2022-03-13 DIAGNOSIS — R509 Fever, unspecified: Secondary | ICD-10-CM | POA: Diagnosis not present

## 2022-03-13 DIAGNOSIS — C9000 Multiple myeloma not having achieved remission: Secondary | ICD-10-CM | POA: Diagnosis not present

## 2022-03-13 LAB — COMPREHENSIVE METABOLIC PANEL
ALT: 25 U/L (ref 0–44)
AST: 29 U/L (ref 15–41)
Albumin: 3 g/dL — ABNORMAL LOW (ref 3.5–5.0)
Alkaline Phosphatase: 71 U/L (ref 38–126)
Anion gap: 8 (ref 5–15)
BUN: 20 mg/dL (ref 8–23)
CO2: 18 mmol/L — ABNORMAL LOW (ref 22–32)
Calcium: 8 mg/dL — ABNORMAL LOW (ref 8.9–10.3)
Chloride: 110 mmol/L (ref 98–111)
Creatinine, Ser: 2.27 mg/dL — ABNORMAL HIGH (ref 0.61–1.24)
GFR, Estimated: 29 mL/min — ABNORMAL LOW (ref >60)
Glucose, Bld: 105 mg/dL — ABNORMAL HIGH (ref 70–99)
Potassium: 4.5 mmol/L (ref 3.5–5.1)
Sodium: 136 mmol/L (ref 135–145)
Total Bilirubin: 0.6 mg/dL (ref 0.3–1.2)
Total Protein: 6.2 g/dL — ABNORMAL LOW (ref 6.5–8.1)

## 2022-03-13 LAB — CBC
HCT: 24.2 % — ABNORMAL LOW (ref 39.0–52.0)
Hemoglobin: 7.8 g/dL — ABNORMAL LOW (ref 13.0–17.0)
MCH: 34.4 pg — ABNORMAL HIGH (ref 26.0–34.0)
MCHC: 32.2 g/dL (ref 30.0–36.0)
MCV: 106.6 fL — ABNORMAL HIGH (ref 80.0–100.0)
Platelets: 204 10*3/uL (ref 150–400)
RBC: 2.27 MIL/uL — ABNORMAL LOW (ref 4.22–5.81)
RDW: 15.3 % (ref 11.5–15.5)
WBC: 4.8 10*3/uL (ref 4.0–10.5)
nRBC: 0 % (ref 0.0–0.2)

## 2022-03-13 LAB — HEPARIN LEVEL (UNFRACTIONATED): Heparin Unfractionated: 0.53 IU/mL (ref 0.30–0.70)

## 2022-03-13 LAB — PROCALCITONIN: Procalcitonin: 1.48 ng/mL

## 2022-03-13 LAB — LACTIC ACID, PLASMA: Lactic Acid, Venous: 1.2 mmol/L (ref 0.5–1.9)

## 2022-03-13 LAB — MAGNESIUM: Magnesium: 2.2 mg/dL (ref 1.7–2.4)

## 2022-03-13 MED ORDER — LACTATED RINGERS IV SOLN: INTRAVENOUS | Status: AC

## 2022-03-13 MED ORDER — SENNOSIDES-DOCUSATE SODIUM 8.6-50 MG PO TABS
2.0000 | ORAL_TABLET | Freq: Two times a day (BID) | ORAL | Status: DC
  Administered 2022-03-13 – 2022-03-16 (×5): 2 via ORAL
  Filled 2022-03-13 (×7): qty 2

## 2022-03-13 MED ORDER — LACTATED RINGERS IV BOLUS
500.0000 mL | Freq: Once | INTRAVENOUS | Status: AC
  Administered 2022-03-13: 500 mL via INTRAVENOUS

## 2022-03-13 MED ORDER — APIXABAN 5 MG PO TABS
5.0000 mg | ORAL_TABLET | Freq: Two times a day (BID) | ORAL | Status: DC
  Administered 2022-03-13 – 2022-03-17 (×9): 5 mg via ORAL
  Filled 2022-03-13 (×9): qty 1

## 2022-03-13 NOTE — Sepsis Progress Note (Signed)
Following per sepsis protocol   

## 2022-03-13 NOTE — Progress Notes (Signed)
PROGRESS NOTE  [name]. ZOX:[id number] DOB: Jun 08, 1946 DOA: 03/09/2022 PCP: [name], MD   LOS: 4 days   Brief Narrative / Interim history: 76 year old male with multiple myeloma recently had chemotherapy 6/26, PAF not on anticoagulation, HTN, HLD, hypothyroidism, comes into the hospital with subjective fevers.  Imaging was concerning for developing pneumonia, he was placed on antibiotics and was admitted to the hospital.  He developed A-fib with RVR  Subjective / 24h Interval events: Rapid response called overnight due to fever of 104, tachycardia, and soft blood pressure.  Patient reports he was completely asymptomatic, denies any sweats, fever, chills and was surprised of the high temperature that was recorded.  This morning he has no chest pain, no shortness of and feels generally well  Assesement and Plan: Principal problem Fever due to pneumonia-febrile again last night and blood cultures sent again.  So far no growth..  He was placed on antibiotics, continue.  He does not really have significant respiratory symptoms so source may be different.  Monitor cultures.  COVID and RVP negative.  Procalcitonin was elevated  Active problems PAF, A-fib with RVR-patient placed on anticoagulation with heparin, amiodarone infusion.  Cardiology consulted, scheduled for TEE cardioversion tomorrow  Hypovolemic hyponatremia-sodium normalized with fluids  Hypokalemia, hypophosphatemia-monitor and replete as indicated  CKD 3B-appears to be close to baseline in the 2 range.  Continue to monitor, avoid nephrotoxins  Anemia of chronic disease, macrocytic anemia-B12 borderline on the lower side of normal, will replete.  He is also iron deficient, placed on iron.  He also has underlying malignancy  Multiple myeloma, recent chemotherapy-outpatient management  Thrombocytopenia-monitor, platelets normalized today  Hypothyroidism-continue Synthroid.  TSH unremarkable  Scheduled  Meds:  cyanocobalamin  1,000 mcg Intramuscular Daily   ferrous gluconate  324 mg Oral TID WC   guaiFENesin  1,200 mg Oral BID   levothyroxine  100 mcg Oral QAC breakfast   metoprolol succinate  25 mg Oral Daily   senna-docusate  2 tablet Oral BID   Continuous Infusions:  amiodarone 30 mg/hr (03/13/22 0100)   ceFEPime (MAXIPIME) IV 2 g (03/13/22 0933)   heparin 1,300 Units/hr (03/13/22 0157)   lactated ringers 75 mL/hr at 03/13/22 0931   PRN Meds:.acetaminophen, HYDROmorphone (DILAUDID) injection, melatonin, ondansetron (ZOFRAN) IV, mouth rinse, oxyCODONE, polyethylene glycol  Diet Orders (From admission, onward)     Start     Ordered   03/14/22 0001  Diet NPO time specified Except for: Sips with Meds  Diet effective midnight       Question:  Except for  Answer:  Sips with Meds   03/12/22 1256   03/09/22 2346  Diet Heart Room service appropriate? Yes; Fluid consistency: Thin  Diet effective now       Question Answer Comment  Room service appropriate? Yes   Fluid consistency: Thin      03/09/22 2345            DVT prophylaxis: SCDs Start: 03/09/22 2311   Lab Results  Component Value Date   PLT 204 03/13/2022      Code Status: Full Code  Family Communication: wife at bedside  Status is: Inpatient  Remains inpatient appropriate because: Severity of illness   Level of care: Progressive  Consultants:  Cardiology   Objective: Vitals:   03/13/22 0900 03/13/22 0917 03/13/22 0922 03/13/22 0930  BP: 103/78  113/85 106/84  Pulse:  (!) 121 (!) 125 (!) 123  Resp:   19 15  Temp:  98.2  F (36.8 C)    TempSrc:  Oral    SpO2:      Weight:      Height:        Intake/Output Summary (Last 24 hours) at 03/13/2022 1008 Last data filed at 03/13/2022 0500 Gross per 24 hour  Intake 2187.13 ml  Output 500 ml  Net 1687.13 ml    Wt Readings from Last 3 Encounters:  03/10/22 98.7 kg  03/08/22 98.9 kg  03/03/22 99.2 kg    Examination:  Constitutional: NAD Eyes:  lids and conjunctivae normal, no scleral icterus ENMT: mmm Neck: normal, supple Respiratory: clear to auscultation bilaterally, no wheezing, no crackles. Normal respiratory effort.  Cardiovascular: Regular rate and rhythm, no murmurs / rubs / gallops. No LE edema. Abdomen: soft, no distention, no tenderness. Bowel sounds positive.  Skin: no rashes Neurologic: no focal deficits, equal strength   Data Reviewed: I have independently reviewed following labs and imaging studies   CBC Recent Labs  Lab 03/08/22 2246 03/09/22 2037 03/10/22 0401 03/11/22 0356 03/12/22 0642 03/13/22 0417  WBC 3.6* 4.3 5.7 3.4* 3.7* 4.8  HGB 9.2* 8.6* 9.1* 8.4* 7.7* 7.8*  HCT 28.3* 25.5* 28.0* 26.1* 23.7* 24.2*  PLT 124* 99* 98* 112* 141* 204  MCV 106.4* 104.5* 108.1* 107.9* 105.8* 106.6*  MCH 34.6* 35.2* 35.1* 34.7* 34.4* 34.4*  MCHC 32.5 33.7 32.5 32.2 32.5 32.2  RDW 14.2 14.3 14.5 14.6 15.1 15.3  LYMPHSABS 0.2* 0.3* 0.3* 0.3* 0.5*  --   MONOABS 0.5 0.4 0.6 0.3 0.4  --   EOSABS 0.0 0.0 0.0 0.1 0.2  --   BASOSABS 0.0 0.0 0.0 0.0 0.0  --      Recent Labs  Lab 03/08/22 2246 03/08/22 2314 03/09/22 2037 03/10/22 0401 03/11/22 0356 03/11/22 1425 03/12/22 0642 03/12/22 0644 03/13/22 0109 03/13/22 0417  NA 139  --  134* 136 137  --  135  --   --  136  K 3.6  --  3.4* 3.6 4.1  --  4.2  --   --  4.5  CL 113*  --  108 110 110  --  110  --   --  110  CO2 18*  --  18* 18* 20*  --  19*  --   --  18*  GLUCOSE 140*  --  106* 100* 115*  --  103*  --   --  105*  BUN 20  --  '19 20 20  ' --  19  --   --  20  CREATININE 1.98*  --  2.01* 1.99* 2.12*  --  2.04*  --   --  2.27*  CALCIUM 8.7*  --  7.9* 7.9* 8.0*  --  7.7*  --   --  8.0*  AST 17  --  25 30 41  --  31  --   --  29  ALT 14  --  '15 18 26  ' --  25  --   --  25  ALKPHOS 95  --  76 73 70  --  69  --   --  71  BILITOT 1.1  --  1.0 1.0 0.7  --  0.7  --   --  0.6  ALBUMIN 3.8  --  3.6 3.4* 3.0*  --  2.9*  --   --  3.0*  MG  --   --  2.2 1.8 2.6*  --   2.4  --   --  2.2  PROCALCITON  --   --   --   --   --  1.80 1.48  --   --  1.48  LATICACIDVEN 1.2  --  0.9  --   --   --   --   --  1.2  --   INR  --  1.1 1.1  --   --   --   --   --   --   --   TSH  --   --   --   --   --   --   --  3.629  --   --      ------------------------------------------------------------------------------------------------------------------ No results for input(s): "CHOL", "HDL", "LDLCALC", "TRIG", "CHOLHDL", "LDLDIRECT" in the last 72 hours.  No results found for: "HGBA1C" ------------------------------------------------------------------------------------------------------------------ Recent Labs    03/12/22 0644  TSH 3.629     Cardiac Enzymes No results for input(s): "CKMB", "TROPONINI", "MYOGLOBIN" in the last 168 hours.  Invalid input(s): "CK" ------------------------------------------------------------------------------------------------------------------ No results found for: "BNP"  CBG: No results for input(s): "GLUCAP" in the last 168 hours.  Recent Results (from the past 240 hour(s))  SARS Coronavirus 2 by RT PCR (hospital order, performed in [facility] hospital lab) *cepheid single result test* Anterior Nasal Swab     Status: None   Collection Time: 03/08/22 11:13 PM   Specimen: Anterior Nasal Swab  Result Value Ref Range Status   SARS Coronavirus 2 by RT PCR NEGATIVE NEGATIVE Final    Comment: (NOTE) SARS-CoV-2 target nucleic acids are NOT DETECTED.  The SARS-CoV-2 RNA is generally detectable in upper and lower respiratory specimens during the acute phase of infection. The lowest concentration of SARS-CoV-2 viral copies this assay can detect is 250 copies / mL. A negative result does not preclude SARS-CoV-2 infection and should not be used as the sole basis for treatment or other patient management decisions.  A negative result may occur with improper specimen collection / handling, submission of specimen other than nasopharyngeal  swab, presence of viral mutation(s) within the areas targeted by this assay, and inadequate number of viral copies (<250 copies / mL). A negative result must be combined with clinical observations, patient history, and epidemiological information.  Fact Sheet for Patients:   https://www.patel.info/  Fact Sheet for Healthcare Providers: https://hall.com/  This test is not yet approved or  cleared by the [place] FDA and has been authorized for detection and/or diagnosis of SARS-CoV-2 by FDA under an Emergency Use Authorization (EUA).  This EUA will remain in effect (meaning this test can be used) for the duration of the COVID-19 declaration under Section 564(b)(1) of the Act, 21 U.S.C. section 360bbb-3(b)(1), unless the authorization is terminated or revoked sooner.  Performed at [facility], [place] [address], [place], [place] [address]   Blood culture (routine x 2)     Status: None (Preliminary result)   Collection Time: 03/08/22 11:14 PM   Specimen: BLOOD  Result Value Ref Range Status   Specimen Description   Final    BLOOD Performed at [facility] [address], [place], [place] [address]    Special Requests   Final    BOTTLES DRAWN AEROBIC AND ANAEROBIC Blood Culture adequate volume Performed at [place] [address] [place], [place] [address]    Culture   Final    NO GROWTH 3 DAYS Performed at [facility], [place] [address] [place],  [address]    Report Status PENDING  Incomplete  Blood culture (routine x 2)     Status: None (Preliminary result)  Collection Time: 03/08/22 11:50 PM   Specimen: BLOOD  Result Value Ref Range Status   Specimen Description   Final    BLOOD LEFT ANTECUBITAL Performed at [facility] [address] [place], [place] [address]    Special Requests   Final    BOTTLES DRAWN AEROBIC AND ANAEROBIC  Blood Culture results may not be optimal due to an excessive volume of blood received in culture bottles Performed at [place] [facility] [address], [place], [place] [address]    Culture   Final    NO GROWTH 3 DAYS Performed at [facility], [place] [address] [place], [place] [address]    Report Status PENDING  Incomplete  Urine Culture     Status: Abnormal   Collection Time: 03/09/22 12:04 AM   Specimen: Urine, Clean Catch  Result Value Ref Range Status   Specimen Description   Final    URINE, CLEAN CATCH Performed at [facility], [place] [address], [place], [place] [address]    Special Requests   Final    NONE Performed at [facility], [place] [address] [place], [place] [address]    Culture (A)  Final    <10,000 COLONIES/mL INSIGNIFICANT GROWTH Performed at [place] [address] [place], [place] [address]    Report Status 03/10/2022 FINAL  Final  Culture, blood (Routine x 2)     Status: None (Preliminary result)   Collection Time: 03/09/22  8:37 PM   Specimen: BLOOD  Result Value Ref Range Status   Specimen Description   Final    BLOOD LEFT ANTECUBITAL Performed at [facility] [address], [place], [place] [address]    Special Requests   Final    BOTTLES DRAWN AEROBIC AND ANAEROBIC Blood Culture results may not be optimal due to an excessive volume of blood received in culture bottles Performed at [facility] [address], [place], [place] [address]    Culture   Final    NO GROWTH 2 DAYS Performed at [place] [address] [place], [place] [address]    Report Status PENDING  Incomplete  Culture, blood (Routine x 2)     Status: None (Preliminary result)   Collection Time: 03/09/22  8:40 PM   Specimen: BLOOD  Result Value Ref Range Status   Specimen Description   Final    BLOOD BLOOD LEFT HAND Performed at [place] [address] [place], [place] [address]    Special Requests   Final    BOTTLES DRAWN AEROBIC AND ANAEROBIC Blood Culture results may not be optimal due to an inadequate volume of blood received in culture bottles Performed at [place] [address] [place], [place] [address]    Culture   Final    NO GROWTH 2 DAYS Performed at [name] [address] [place], [place] [address]    Report Status PENDING  Incomplete  MRSA Next Gen by PCR, Nasal     Status: None   Collection Time: 03/10/22 12:17 PM   Specimen: Nasal Mucosa; Nasal Swab  Result Value Ref Range Status   MRSA by PCR Next Gen NOT DETECTED NOT DETECTED Final    Comment: (NOTE) The GeneXpert MRSA Assay (FDA approved for NASAL specimens only), is one component of a comprehensive MRSA colonization surveillance program. It is not intended to diagnose MRSA infection nor  to guide or monitor treatment for MRSA infections. Test performance is not FDA approved in patients less than 35 years old. Performed at [facility], [place] [address], [place], [place] [address]   Respiratory (~20 pathogens) panel by PCR     Status: None   Collection Time: 03/11/22  1:21 PM   Specimen: Nasopharyngeal Swab; Respiratory  Result Value Ref Range Status   Adenovirus NOT DETECTED NOT DETECTED Final   Coronavirus 229E NOT DETECTED NOT DETECTED Final    Comment: (NOTE) The Coronavirus on the Respiratory Panel, DOES NOT test for the novel  Coronavirus (2019 nCoV)    Coronavirus HKU1 NOT DETECTED NOT DETECTED Final   Coronavirus NL63 NOT DETECTED NOT DETECTED Final   Coronavirus OC43 NOT DETECTED NOT DETECTED Final   Metapneumovirus NOT DETECTED NOT DETECTED Final   Rhinovirus / Enterovirus NOT DETECTED NOT DETECTED Final   Influenza A NOT DETECTED NOT DETECTED Final   Influenza B NOT DETECTED NOT DETECTED Final   Parainfluenza Virus 1 NOT DETECTED NOT DETECTED Final   Parainfluenza Virus 2  NOT DETECTED NOT DETECTED Final   Parainfluenza Virus 3 NOT DETECTED NOT DETECTED Final   Parainfluenza Virus 4 NOT DETECTED NOT DETECTED Final   Respiratory Syncytial Virus NOT DETECTED NOT DETECTED Final   Bordetella pertussis NOT DETECTED NOT DETECTED Final   Bordetella Parapertussis NOT DETECTED NOT DETECTED Final   Chlamydophila pneumoniae NOT DETECTED NOT DETECTED Final   Mycoplasma pneumoniae NOT DETECTED NOT DETECTED Final    Comment: Performed at [facility], [place]. [address] [place], [place] [address]     Radiology Studies: ECHOCARDIOGRAM COMPLETE  Result Date: 03/12/2022    ECHOCARDIOGRAM REPORT   Patient Name:   Roberto Romanoski. Date of Exam: 03/12/2022 Medical Rec #:  [id number]           Height:       77.0 in Accession #:    [id number]          Weight:       217.6 lb Date of Birth:  [birth year]          BSA:          2.318 m Patient Age:    64 years            BP:           108/84 mmHg Patient Gender: M                   HR:           128 bpm. Exam Location:  Inpatient Procedure: 2D Echo, Cardiac Doppler, Color Doppler and Intracardiac            Opacification Agent Indications:    Atrial Flutter I48.92  History:        Patient has no prior history of Echocardiogram examinations.                 Risk Factors:Hypertension and Dyslipidemia. Hypothyroidism.                 Fever due to pneumonia. Chronic kidney disease, anemia.  Sonographer:    Darlina Sicilian RDCS Referring Phys: [id number] [place]  1. Left ventricular ejection fraction, by estimation, is 40 to 45%. The left ventricle has mildly decreased function. The left ventricle has no regional wall motion abnormalities. There is mild concentric left ventricular hypertrophy. Left ventricular diastolic parameters are consistent with Grade I diastolic dysfunction (  impaired relaxation).  2. Right ventricular systolic function is normal. The right ventricular size is normal. There is mildly elevated pulmonary  artery systolic pressure.  3. The mitral valve is normal in structure. Mild mitral valve regurgitation. No evidence of mitral stenosis.  4. The aortic valve is tricuspid. Aortic valve regurgitation is trivial. Aortic valve sclerosis is present, with no evidence of aortic valve stenosis.  5. The inferior vena cava is normal in size with greater than 50% respiratory variability, suggesting right atrial pressure of 3 mmHg. FINDINGS  Left Ventricle: Left ventricular ejection fraction, by estimation, is 40 to 45%. The left ventricle has mildly decreased function. The left ventricle has no regional wall motion abnormalities. Definity contrast agent was given IV to delineate the left ventricular endocardial borders. The left ventricular internal cavity size was normal in size. There is mild concentric left ventricular hypertrophy. Left ventricular diastolic parameters are consistent with Grade I diastolic dysfunction (impaired relaxation). Right Ventricle: The right ventricular size is normal. No increase in right ventricular wall thickness. Right ventricular systolic function is normal. There is mildly elevated pulmonary artery systolic pressure. The tricuspid regurgitant velocity is 2.66  m/s, and with an assumed right atrial pressure of 8 mmHg, the estimated right ventricular systolic pressure is 45.0 mmHg. Left Atrium: Left atrial size was normal in size. Right Atrium: Right atrial size was normal in size. Pericardium: There is no evidence of pericardial effusion. Mitral Valve: The mitral valve is normal in structure. Mild mitral valve regurgitation. No evidence of mitral valve stenosis. Tricuspid Valve: The tricuspid valve is normal in structure. Tricuspid valve regurgitation is not demonstrated. No evidence of tricuspid stenosis. Aortic Valve: The aortic valve is tricuspid. Aortic valve regurgitation is trivial. Aortic valve sclerosis is present, with no evidence of aortic valve stenosis. Pulmonic Valve: The pulmonic  valve was normal in structure. Pulmonic valve regurgitation is not visualized. No evidence of pulmonic stenosis. Aorta: The aortic root is normal in size and structure. Venous: The inferior vena cava is normal in size with greater than 50% respiratory variability, suggesting right atrial pressure of 3 mmHg. IAS/Shunts: No atrial level shunt detected by color flow Doppler.  LEFT VENTRICLE PLAX 2D LVIDd:         4.60 cm      Diastology LVIDs:         3.30 cm      LV e' medial:    7.94 cm/s LV PW:         1.20 cm      LV E/e' medial:  13.1 LV IVS:        1.30 cm      LV e' lateral:   12.85 cm/s LVOT diam:     2.10 cm      LV E/e' lateral: 8.1 LV SV:         38 LV SV Index:   16 LVOT Area:     3.46 cm  LV Volumes (MOD) LV vol d, MOD A2C: 114.0 ml LV vol d, MOD A4C: 117.0 ml LV vol s, MOD A2C: 61.9 ml LV vol s, MOD A4C: 54.5 ml LV SV MOD A2C:     52.1 ml LV SV MOD A4C:     117.0 ml LV SV MOD BP:      54.9 ml RIGHT VENTRICLE RV S prime:     14.80 cm/s LEFT ATRIUM             Index  RIGHT ATRIUM           Index LA diam:        4.10 cm 1.77 cm/m   RA Area:     13.20 cm LA Vol (A2C):   63.1 ml 27.22 ml/m  RA Volume:   31.60 ml  13.63 ml/m LA Vol (A4C):   70.4 ml 30.37 ml/m LA Biplane Vol: 72.2 ml 31.14 ml/m  AORTIC VALVE LVOT Vmax:   80.00 cm/s LVOT Vmean:  53.600 cm/s LVOT VTI:    0.109 m  AORTA Ao Root diam: 3.50 cm Ao Asc diam:  3.00 cm MITRAL VALVE                TRICUSPID VALVE MV Area (PHT): 4.19 cm     TR Peak grad:   28.3 mmHg MV Decel Time: 181 msec     TR Vmax:        266.00 cm/s MV E velocity: 103.75 cm/s                             SHUNTS                             Systemic VTI:  0.11 m                             Systemic Diam: 2.10 cm Kardie Tobb DO Electronically signed by Berniece Salines DO Signature Date/Time: 03/12/2022/2:19:52 PM    Final      Marzetta Board, MD, PhD [facility]  Between 7 am - 7 pm I am available, please contact me via Amion (for emergencies) or Securechat (non  urgent messages)  Between 7 pm - 7 am I am not available, please contact night coverage MD/APP via Amion

## 2022-03-13 NOTE — Progress Notes (Signed)
Progress Note  Patient Name: Darren Hinton. Date of Encounter: 03/13/2022  [facility] Cardiologist: None   Subjective   Had fever to 104 F again last night, but was asymptomatic.  No rigors, cough, abdominal pain, urinary symptoms.  Borderline hypotensive.  Remains in atrial fibrillation with rapid ventricular response.  Inpatient Medications    Scheduled Meds:  cyanocobalamin  1,000 mcg Intramuscular Daily   ferrous gluconate  324 mg Oral TID WC   guaiFENesin  1,200 mg Oral BID   levothyroxine  100 mcg Oral QAC breakfast   metoprolol succinate  25 mg Oral Daily   senna-docusate  2 tablet Oral BID   Continuous Infusions:  amiodarone 30 mg/hr (03/13/22 0100)   ceFEPime (MAXIPIME) IV 2 g (03/13/22 0933)   heparin 1,300 Units/hr (03/13/22 0157)   lactated ringers 75 mL/hr at 03/13/22 0931   PRN Meds: acetaminophen, HYDROmorphone (DILAUDID) injection, melatonin, ondansetron (ZOFRAN) IV, mouth rinse, oxyCODONE, polyethylene glycol   Vital Signs    Vitals:   03/13/22 0900 03/13/22 0917 03/13/22 0922 03/13/22 0930  BP: 103/78  113/85 106/84  Pulse:  (!) 121 (!) 125 (!) 123  Resp:   19 15  Temp:  98.2 F (36.8 C)    TempSrc:  Oral    SpO2:      Weight:      Height:        Intake/Output Summary (Last 24 hours) at 03/13/2022 1124 Last data filed at 03/13/2022 0500 Gross per 24 hour  Intake 2187.13 ml  Output 500 ml  Net 1687.13 ml      03/10/2022   12:20 AM 03/09/2022    8:29 PM 03/08/2022   10:17 PM  Last 3 Weights  Weight (lbs) 217 lb 9.5 oz 218 lb 218 lb  Weight (kg) 98.7 kg 98.884 kg 98.884 kg      Telemetry    Atrial fibrillation rapid ventricular response (110s at rest, 130s with minimal movement)- Personally Reviewed  ECG    No new tracing- Personally Reviewed  Physical Exam  Appears well, quite comfortable lying fully supine in bed GEN: No acute distress.   Neck: No JVD Cardiac: Rapid and irregular rhythm, no murmurs, rubs, or gallops.   Respiratory: Clear to auscultation bilaterally. GI: Soft, nontender, non-distended  MS: No edema; No deformity. Neuro:  Nonfocal  Psych: Normal affect   Labs    High Sensitivity Troponin:   Recent Labs  Lab 03/11/22 1422 03/11/22 1555  TROPONINIHS 22* 21*     Chemistry Recent Labs  Lab 03/11/22 0356 03/12/22 0642 03/13/22 0417  NA 137 135 136  K 4.1 4.2 4.5  CL 110 110 110  CO2 20* 19* 18*  GLUCOSE 115* 103* 105*  BUN $Re'20 19 20  'PIu$ CREATININE 2.12* 2.04* 2.27*  CALCIUM 8.0* 7.7* 8.0*  MG 2.6* 2.4 2.2  PROT 6.1* 5.8* 6.2*  ALBUMIN 3.0* 2.9* 3.0*  AST 41 31 29  ALT $Re'26 25 25  'wTl$ ALKPHOS 70 69 71  BILITOT 0.7 0.7 0.6  GFRNONAA 32* 33* 29*  ANIONGAP $RemoveB'7 6 8    'JYsJKgNT$ Lipids No results for input(s): "CHOL", "TRIG", "HDL", "LABVLDL", "LDLCALC", "CHOLHDL" in the last 168 hours.  Hematology Recent Labs  Lab 03/11/22 0356 03/12/22 0642 03/12/22 0644 03/13/22 0417  WBC 3.4* 3.7*  --  4.8  RBC 2.42* 2.24* 2.30* 2.27*  HGB 8.4* 7.7*  --  7.8*  HCT 26.1* 23.7*  --  24.2*  MCV 107.9* 105.8*  --  106.6*  MCH 34.7* 34.4*  --  34.4*  MCHC 32.2 32.5  --  32.2  RDW 14.6 15.1  --  15.3  PLT 112* 141*  --  204   Thyroid  Recent Labs  Lab 03/12/22 0644  TSH 3.629    BNPNo results for input(s): "BNP", "PROBNP" in the last 168 hours.  DDimer No results for input(s): "DDIMER" in the last 168 hours.   Radiology    ECHOCARDIOGRAM COMPLETE  Result Date: 03/12/2022    ECHOCARDIOGRAM REPORT   Patient Name:   Darren Hinton. Date of Exam: 03/12/2022 Medical Rec #:  [id number]           Height:       77.0 in Accession #:    [id number]          Weight:       217.6 lb Date of Birth:  09-Aug-1946          BSA:          2.318 m Patient Age:    23 years            BP:           108/84 mmHg Patient Gender: M                   HR:           128 bpm. Exam Location:  Inpatient Procedure: 2D Echo, Cardiac Doppler, Color Doppler and Intracardiac            Opacification Agent Indications:    Atrial  Flutter I48.92  History:        Patient has no prior history of Echocardiogram examinations.                 Risk Factors:Hypertension and Dyslipidemia. Hypothyroidism.                 Fever due to pneumonia. Chronic kidney disease, anemia.  Sonographer:    Darlina Sicilian RDCS Referring Phys: [id number] [place]  1. Left ventricular ejection fraction, by estimation, is 40 to 45%. The left ventricle has mildly decreased function. The left ventricle has no regional wall motion abnormalities. There is mild concentric left ventricular hypertrophy. Left ventricular diastolic parameters are consistent with Grade I diastolic dysfunction (impaired relaxation).  2. Right ventricular systolic function is normal. The right ventricular size is normal. There is mildly elevated pulmonary artery systolic pressure.  3. The mitral valve is normal in structure. Mild mitral valve regurgitation. No evidence of mitral stenosis.  4. The aortic valve is tricuspid. Aortic valve regurgitation is trivial. Aortic valve sclerosis is present, with no evidence of aortic valve stenosis.  5. The inferior vena cava is normal in size with greater than 50% respiratory variability, suggesting right atrial pressure of 3 mmHg. FINDINGS  Left Ventricle: Left ventricular ejection fraction, by estimation, is 40 to 45%. The left ventricle has mildly decreased function. The left ventricle has no regional wall motion abnormalities. Definity contrast agent was given IV to delineate the left ventricular endocardial borders. The left ventricular internal cavity size was normal in size. There is mild concentric left ventricular hypertrophy. Left ventricular diastolic parameters are consistent with Grade I diastolic dysfunction (impaired relaxation). Right Ventricle: The right ventricular size is normal. No increase in right ventricular wall thickness. Right ventricular systolic function is normal. There is mildly elevated pulmonary artery  systolic pressure. The tricuspid regurgitant velocity is 2.66  m/s, and with an  assumed right atrial pressure of 8 mmHg, the estimated right ventricular systolic pressure is 40.9 mmHg. Left Atrium: Left atrial size was normal in size. Right Atrium: Right atrial size was normal in size. Pericardium: There is no evidence of pericardial effusion. Mitral Valve: The mitral valve is normal in structure. Mild mitral valve regurgitation. No evidence of mitral valve stenosis. Tricuspid Valve: The tricuspid valve is normal in structure. Tricuspid valve regurgitation is not demonstrated. No evidence of tricuspid stenosis. Aortic Valve: The aortic valve is tricuspid. Aortic valve regurgitation is trivial. Aortic valve sclerosis is present, with no evidence of aortic valve stenosis. Pulmonic Valve: The pulmonic valve was normal in structure. Pulmonic valve regurgitation is not visualized. No evidence of pulmonic stenosis. Aorta: The aortic root is normal in size and structure. Venous: The inferior vena cava is normal in size with greater than 50% respiratory variability, suggesting right atrial pressure of 3 mmHg. IAS/Shunts: No atrial level shunt detected by color flow Doppler.  LEFT VENTRICLE PLAX 2D LVIDd:         4.60 cm      Diastology LVIDs:         3.30 cm      LV e' medial:    7.94 cm/s LV PW:         1.20 cm      LV E/e' medial:  13.1 LV IVS:        1.30 cm      LV e' lateral:   12.85 cm/s LVOT diam:     2.10 cm      LV E/e' lateral: 8.1 LV SV:         38 LV SV Index:   16 LVOT Area:     3.46 cm  LV Volumes (MOD) LV vol d, MOD A2C: 114.0 ml LV vol d, MOD A4C: 117.0 ml LV vol s, MOD A2C: 61.9 ml LV vol s, MOD A4C: 54.5 ml LV SV MOD A2C:     52.1 ml LV SV MOD A4C:     117.0 ml LV SV MOD BP:      54.9 ml RIGHT VENTRICLE RV S prime:     14.80 cm/s LEFT ATRIUM             Index        RIGHT ATRIUM           Index LA diam:        4.10 cm 1.77 cm/m   RA Area:     13.20 cm LA Vol (A2C):   63.1 ml 27.22 ml/m  RA Volume:    31.60 ml  13.63 ml/m LA Vol (A4C):   70.4 ml 30.37 ml/m LA Biplane Vol: 72.2 ml 31.14 ml/m  AORTIC VALVE LVOT Vmax:   80.00 cm/s LVOT Vmean:  53.600 cm/s LVOT VTI:    0.109 m  AORTA Ao Root diam: 3.50 cm Ao Asc diam:  3.00 cm MITRAL VALVE                TRICUSPID VALVE MV Area (PHT): 4.19 cm     TR Peak grad:   28.3 mmHg MV Decel Time: 181 msec     TR Vmax:        266.00 cm/s MV E velocity: 103.75 cm/s                             SHUNTS  Systemic VTI:  0.11 m                             Systemic Diam: 2.10 cm Godfrey Pick Tobb DO Electronically signed by Berniece Salines DO Signature Date/Time: 03/12/2022/2:19:52 PM    Final    DG CHEST PORT 1 VIEW  Result Date: 03/12/2022 CLINICAL DATA:  Shortness of breath. EXAM: PORTABLE CHEST 1 VIEW COMPARISON:  03/11/2022. FINDINGS: Similar mild interstitial and patchy airspace opacities in the left lung. Similar streaky opacities at the right lung base. No visible pleural effusions or pneumothorax. No acute osseous abnormality. Polyarticular degenerative change. IMPRESSION: 1. Similar mild interstitial and patchy airspace opacities in the left lung which could represent asymmetric edema or infection. 2. Similar mild streaky right basilar opacities, which could represent atelectasis and/or pneumonia. Electronically Signed   By: Margaretha Sheffield M.D.   On: 03/12/2022 08:08   VAS [place] LOWER EXTREMITY VENOUS (DVT)  Result Date: 03/12/2022  Lower Venous DVT Study Patient Name:  Darren Hinton.  Date of Exam:   03/11/2022 Medical Rec #: [id number]            Accession #:    [id number] Date of Birth: [birth year]           Patient Gender: M Patient Age:   44 years Exam Location:  [facility] Procedure:      VAS [place] LOWER EXTREMITY VENOUS (DVT) Referring Phys: Raiford Noble --------------------------------------------------------------------------------  Indications: Swelling.  Comparison Study: No prior studies. Performing Technologist: Darlin Coco RDMS,  RVT  Examination Guidelines: A complete evaluation includes B-mode imaging, spectral Doppler, color Doppler, and power Doppler as needed of all accessible portions of each vessel. Bilateral testing is considered an integral part of a complete examination. Limited examinations for reoccurring indications may be performed as noted. The reflux portion of the exam is performed with the patient in reverse Trendelenburg.  +---------+---------------+---------+-----------+----------+--------------+ RIGHT    CompressibilityPhasicitySpontaneityPropertiesThrombus Aging +---------+---------------+---------+-----------+----------+--------------+ CFV      Full           Yes      Yes                                 +---------+---------------+---------+-----------+----------+--------------+ SFJ      Full                                                        +---------+---------------+---------+-----------+----------+--------------+ FV Prox  Full                                                        +---------+---------------+---------+-----------+----------+--------------+ FV Mid   Full                                                        +---------+---------------+---------+-----------+----------+--------------+ FV DistalFull                                                        +---------+---------------+---------+-----------+----------+--------------+  PFV      Full                                                        +---------+---------------+---------+-----------+----------+--------------+ POP      Full           Yes      Yes                                 +---------+---------------+---------+-----------+----------+--------------+ PTV      Full                                                        +---------+---------------+---------+-----------+----------+--------------+ PERO     Full                                                         +---------+---------------+---------+-----------+----------+--------------+   +---------+---------------+---------+-----------+----------+--------------+ LEFT     CompressibilityPhasicitySpontaneityPropertiesThrombus Aging +---------+---------------+---------+-----------+----------+--------------+ CFV      Full           Yes      Yes                                 +---------+---------------+---------+-----------+----------+--------------+ SFJ      Full                                                        +---------+---------------+---------+-----------+----------+--------------+ FV Prox  Full                                                        +---------+---------------+---------+-----------+----------+--------------+ FV Mid   Full                                                        +---------+---------------+---------+-----------+----------+--------------+ FV DistalFull                                                        +---------+---------------+---------+-----------+----------+--------------+ PFV      Full                                                        +---------+---------------+---------+-----------+----------+--------------+  POP      Full           Yes      Yes                                 +---------+---------------+---------+-----------+----------+--------------+ PTV      Full                                                        +---------+---------------+---------+-----------+----------+--------------+ PERO     Full                                                        +---------+---------------+---------+-----------+----------+--------------+     Summary: RIGHT: - There is no evidence of deep vein thrombosis in the lower extremity.  - No cystic structure found in the popliteal fossa.  LEFT: - There is no evidence of deep vein thrombosis in the lower extremity.  - No cystic structure found in the popliteal fossa.   *See table(s) above for measurements and observations. Electronically signed by Jamelle Haring on 03/12/2022 at 2:07:01 AM.    Final     Cardiac Studies   Echocardiogram 03/12/2022  1. Left ventricular ejection fraction, by estimation, is 40 to 45%. The left ventricle has mildly decreased function. The left ventricle has no regional wall motion abnormalities. There is mild concentric left ventricular hypertrophy. Left ventricular diastolic parameters are consistent with Grade I diastolic dysfunction (impaired relaxation).  2. Right ventricular systolic function is normal. The right ventricular size is normal. There is mildly elevated pulmonary artery systolic pressure.  3. The mitral valve is normal in structure. Mild mitral valve regurgitation. No evidence of mitral stenosis.  4. The aortic valve is tricuspid. Aortic valve regurgitation is trivial. Aortic valve sclerosis is present, with no evidence of aortic valve stenosis.  5. The inferior vena cava is normal in size with greater than 50% respiratory variability, suggesting right atrial pressure of 3 mmHg.  Patient Profile     76 y.o. male with multiple myeloma on chemotherapy, persistent atrial fibrillation, history of hypertension, hyperlipidemia hypothyroidism, anxiety and depression, admitted with acute febrile illness and atrial fibrillation rapid ventricular response.  Assessment & Plan    Afib w RVR: Continues to have tachycardia despite intravenous amiodarone, other AV nodal blocking agents limited by low blood pressure and/or renal dysfunction.  We have reduced his home dose of metoprolol succinate from 100 mg daily to 25 mg daily.  Arrhythmia unaware.  Was not on anticoagulation prior to this admission.  Scheduled for TEE cardioversion tomorrow at 11 AM. This procedure has been fully reviewed with the patient and written informed consent has been obtained.  CHA2DS2-VASc 4 (LV dysfunction, history of hypertension, age 64).   Acute febrile  illness: Despite antibiotics he continues to have episodic high fever.  Is being treated for pneumonia, but chest x-ray findings are not particularly impressive and he does not have cough or hypoxia.  No other obvious source of infection.  On chemotherapy for multiple myeloma and is likely.  I think the TEE will also be helpful to exclude  endocarditis.  He is immunosuppressed. Anemia: multifactorial.  No evidence of active bleeding.  Has been on IV heparin since admission without overt bleeding and with stable hemoglobin.  Transition to apixaban 2.5 mg twice daily  (age<80, weight> 60 kg, despite renal dysfunction).  He has evidence of iron deficiency, but has macrocytosis.  Likely component of bone marrow suppression in addition to iron deficiency.  See results of colonoscopy with Dr. Havery Moros in 2019 (a single small angiodysplastic lesion, internal hemorrhoids). Decreased LV stock function: LVEF 40-45% by transthoracic echo.  Thankfully without overt signs of heart failure decompensation.  Other than low-dose beta-blocker, blood pressure does not allow other guideline directed medical therapy.  Is not have a history of CAD or complaints of angina pectoris.  ECG does not support ischemic heart disease.  Unclear whether the decrease in EF is related to chemotherapy or due to tachyarrhythmia.  Reassess after return to normal rhythm. Multiple myeloma: on CyBorD.  Cyclophosphamide and bortezomib can have cardiotoxic effects.   CKD3B: Creatinine is just slightly worse than his previous baseline around 1.9-2.0 (typical GFR 30-35).     For questions or updates, please contact [place] Please consult [email or web address] for contact info under        Signed, Sanda Klein, MD  03/13/2022, 11:24 AM

## 2022-03-13 NOTE — Progress Notes (Signed)
    OVERNIGHT PROGRESS REPORT  Notified by RR RN and assigned RN for temp of 104F and heart rate 150. Code sepsis initiated. Limited fluid bolus given due to EF of 40%. Tylenol, ice packs, room temperature utilized. Blood cultures ordered.  Patient is in no obvious or stated distress, awake oriented and pleasant and states that he has temperatures recurrently but does not know what except when he does check his temperature at home etc.  E-Link notified for sepsis and is tracking patient.  Update: 0245 hrs    At approximately 1.5 hours temperature is under 100F Pulse rate is variable at 112-124, patient is on amiodarone. MAP is 75. Respiratory rate is 17. Patient is resting comfortably in no obvious or stated distress.  Patient to remain in current floor/room.  Gershon Cull MSNA MSN ACNPC-AG Acute Care Nurse Practitioner [place]

## 2022-03-13 NOTE — Progress Notes (Signed)
OT Cancellation Note  Patient Details Name: Darren Hinton. MRN: [id number] DOB: 09-Feb-1946   Cancelled Treatment:    Reason Eval/Treat Not Completed: Medical issues which prohibited therapy Per cardiologist note, patient in A fib with RVR with plans for TEE cardioversion on 03/14/22. OT to continue to follow and check back after procedure for updated orders.  Jackelyn Poling OTR/L, [place] [facility] Department Office# [phone] Pager# [phone] 03/13/2022, 1:57 PM

## 2022-03-13 NOTE — Progress Notes (Signed)
   03/13/22 0030  Assess: MEWS Score  Temp (!) 104 F (40 C)  BP (!) 84/70  MAP (mmHg) 77  Pulse Rate (!) 111  ECG Heart Rate (!) 152  Resp (!) 30  SpO2 90 %  Assess: MEWS Score  MEWS Temp 2  MEWS Systolic 1  MEWS Pulse 3  MEWS RR 2  MEWS LOC 0  MEWS Score 8  MEWS Score Color Red  Assess: if the MEWS score is Yellow or Red  Were vital signs taken at a resting state? Yes  Focused Assessment No change from prior assessment  Does the patient meet 2 or more of the SIRS criteria? Yes  Does the patient have a confirmed or suspected source of infection? Yes  Provider and Rapid Response Notified? Yes  MEWS guidelines implemented *See Row Information* Yes  Assess: SIRS CRITERIA  SIRS Temperature  1  SIRS Pulse 1  SIRS Respirations  1  SIRS WBC 0  SIRS Score Sum  3   CN, RR nurse, and on-call notified who all rounded on pt. PRN Tylenol given for fever and orders given by on-call performed. Pt denies malaise or pain with the exception of mild discomfort at the end of deep breathes in. RN to continue monitoring pt.

## 2022-03-13 NOTE — Discharge Instructions (Signed)
Information on my medicine - ELIQUIS (apixaban)  This medication education was reviewed with me or my healthcare representative as part of my discharge preparation.  The pharmacist that spoke with me during my hospital stay was:  Nickol Collister, [facility]  Why was Eliquis prescribed for you? Eliquis was prescribed for you to reduce the risk of a blood clot forming that can cause a stroke if you have a medical condition called atrial fibrillation (a type of irregular heartbeat).  What do You need to know about Eliquis ? Take your Eliquis TWICE DAILY - one tablet in the morning and one tablet in the evening with or without food. If you have difficulty swallowing the tablet whole please discuss with your pharmacist how to take the medication safely.  Take Eliquis exactly as prescribed by your doctor and DO NOT stop taking Eliquis without talking to the doctor who prescribed the medication.  Stopping may increase your risk of developing a stroke.  Refill your prescription before you run out.  After discharge, you should have regular check-up appointments with your healthcare provider that is prescribing your Eliquis.  In the future your dose may need to be changed if your kidney function or weight changes by a significant amount or as you get older.  What do you do if you miss a dose? If you miss a dose, take it as soon as you remember on the same day and resume taking twice daily.  Do not take more than one dose of ELIQUIS at the same time to make up a missed dose.  Important Safety Information A possible side effect of Eliquis is bleeding. You should call your healthcare provider right away if you experience any of the following: Bleeding from an injury or your nose that does not stop. Unusual colored urine (red or dark brown) or unusual colored stools (red or black). Unusual bruising for unknown reasons. A serious fall or if you hit your head (even if there is no bleeding).  Some medicines  may interact with Eliquis and might increase your risk of bleeding or clotting while on Eliquis. To help avoid this, consult your healthcare provider or pharmacist prior to using any new prescription or non-prescription medications, including herbals, vitamins, non-steroidal anti-inflammatory drugs (NSAIDs) and supplements.  This website has more information on Eliquis (apixaban): http://www.eliquis.com/eliquis/home

## 2022-03-13 NOTE — Significant Event (Signed)
Rapid Response Event Note   Reason for Call :  Alerted by unit charge nurse and bedside nurse of recurrent fever, hypotension, and tachycardia. Patient currently under treatment for pneumonia with antibiotics and A-fib RVR on Amiodarone with cardiology consulting.  Initial Focused Assessment:  Patient is alert, oriented, and denies pain or active distress. Wife at bedside. Patient reports right-sided soreness with slight breathing difficulty at end of exhalation. Patient reports compliance with flutter valve and incentive spirometry use. Patient denies cough or congestion. Minimal BLE edema, no wheezes, crackles, or rhonchi heard in lung fields. Heart rhythm remains in a-fib with rate 120-150 and on Amiodarone drip. Bowel sounds active with soft, non-distended abdomen.  VS: oral temp 104; BP 84/40 MAP 77, HR 152, RR 30, oxygen saturation 90-97% on Room air. IV site on left hand is tender to touch, hand is slightly swollen, but no redness, surface temperature change or redness observed.  Interventions:  Assessment and chart review. Provider previously notified by bedside RN and at bedside.  LR 500 ml bolus initiated as ordered. Tylenol already given just prior to my arrival. Room temperature decreased, additional blankets removed, and ice packs applied.  Provided education regarding "code sepsis" and related testing/interventions to patient and wife. Lactic acid drawn by phlebotomy. Monitored vitals including temperatures and lab results. Blood cultures ordered. IV team consult ordered to have existing IV sites assessed further and new site placed.  Plan of Care:  Prior to end of this event, temperature down to 99.9, SBP up to 100's, Lactic acid resulted at 1.2. E-link following for "code sepsis". Staff knows to call provider and rapid nurse for any further changes or needs. Patient will remain in current level of care at this time. Patient and wife expressed appreciation to all staff for attentive  care.  Event Summary:   MD Notified: Gershon Cull, NP  Call Time: 0045 Arrival Time: 7322 End Time: [place], RN

## 2022-03-13 NOTE — Progress Notes (Signed)
ANTICOAGULATION CONSULT NOTE  Pharmacy Consult for Heparin Indication: atrial fibrillation  Allergies  Allergen Reactions   Zithromax [Azithromycin] Other (See Comments)    Patient Measurements: Height: '6\' 5"'  (195.6 cm) Weight: 98.7 kg (217 lb 9.5 oz) IBW/kg (Calculated) : 89.1 Heparin Dosing Weight:  98.7 kg  Vital Signs: Temp: 98.2 F (36.8 C) (07/06 0917) Temp Source: Oral (07/06 0917) BP: 106/84 (07/06 0930) Pulse Rate: 123 (07/06 0930)  Labs: Recent Labs    03/11/22 0356 03/11/22 1422 03/11/22 1555 03/11/22 2317 03/12/22 0642 03/12/22 0644 03/13/22 0417  HGB 8.4*  --   --   --  7.7*  --  7.8*  HCT 26.1*  --   --   --  23.7*  --  24.2*  PLT 112*  --   --   --  141*  --  204  HEPARINUNFRC  --   --   --  0.48  --  0.54 0.53  CREATININE 2.12*  --   --   --  2.04*  --  2.27*  TROPONINIHS  --  22* 21*  --   --   --   --      Estimated Creatinine Clearance: 34.9 mL/min (A) (by C-G formula based on SCr of 2.27 mg/dL (H)).   Medical History: Past Medical History:  Diagnosis Date   Allergy    Anxiety    Arthritis    Depression    Heart murmur    Hyperlipidemia    Hyperplastic colon polyp    Hyperproteinemia    Hypertension    Hypothyroidism    Mitral valve prolapse    Paroxysmal atrial fibrillation (HCC)    Thyroid disease    Assessment: Active Problem(s): FUO. Mild dysuria, Afib  PMH: Multiple myeloma on recent chemotherapy (03/03/22), paroxysmal A-fib not oral anticoagulated, essential hypertension, hyperlipidemia, hypothyroidism, mitral valve prolapse, hypothyroidism, chronic anxiety/depression, CKD3  AC/Heme: No anticoag PTA. Start IV heparin for afib.  Baseline CBC: Hgb 8.4. Plts only 112. CHA2DS2-VASc score of 3  03/13/2022: Heparin level 0.53- therapeutic on IV heparin 1300 units/hr Hgb low but stable at 7.8, plt 204    No bleeding or infusion related noted   Goal of Therapy:  Heparin level 0.3-0.7 units/ml Monitor platelets by  anticoagulation protocol: Yes   Plan:  Continue IV heparin 1300 units/hr Daily HL and CBC- monitor closely for thrombocytopenia  Royetta Asal, PharmD, BCPS 03/13/2022 10:05 AM

## 2022-03-13 NOTE — Anesthesia Preprocedure Evaluation (Addendum)
Anesthesia Evaluation  Patient identified by MRN, date of birth, ID band Patient awake    Reviewed: Allergy & Precautions, NPO status , Patient's Chart, lab work & pertinent test results  Airway Mallampati: II  TM Distance: >3 FB Neck ROM: Full    Dental no notable dental hx. (+) Teeth Intact, Dental Advisory Given   Pulmonary former smoker,    Pulmonary exam normal breath sounds clear to auscultation       Cardiovascular hypertension, Normal cardiovascular exam+ dysrhythmias Atrial Fibrillation  Rhythm:Regular Rate:Normal     Neuro/Psych Anxiety    GI/Hepatic   Endo/Other  Hypothyroidism   Renal/GU      Musculoskeletal   Abdominal   Peds  Hematology Multiple myeloma   Anesthesia Other Findings Azithromycin  Reproductive/Obstetrics                            Anesthesia Physical Anesthesia Plan  ASA: 3  Anesthesia Plan: MAC   Post-op Pain Management:    Induction:   PONV Risk Score and Plan: Treatment may vary due to age or medical condition  Airway Management Planned: Nasal Cannula and Natural Airway  Additional Equipment: None  Intra-op Plan:   Post-operative Plan:   Informed Consent: I have reviewed the patients History and Physical, chart, labs and discussed the procedure including the risks, benefits and alternatives for the proposed anesthesia with the patient or authorized representative who has indicated his/her understanding and acceptance.     Dental advisory given  Plan Discussed with: CRNA and Anesthesiologist  Anesthesia Plan Comments:      Anesthesia Quick Evaluation

## 2022-03-14 ENCOUNTER — Encounter (HOSPITAL_COMMUNITY)
Admission: EM | Disposition: A | Discharge status: Home / Self Care | Payer: Self-pay | Source: Home / Self Care | Attending: Internal Medicine

## 2022-03-14 ENCOUNTER — Encounter (HOSPITAL_COMMUNITY): Payer: Self-pay | Admitting: Internal Medicine

## 2022-03-14 ENCOUNTER — Inpatient Hospital Stay (HOSPITAL_COMMUNITY): Payer: No Typology Code available for payment source | Admitting: Anesthesiology

## 2022-03-14 ENCOUNTER — Inpatient Hospital Stay (HOSPITAL_COMMUNITY): Payer: No Typology Code available for payment source

## 2022-03-14 DIAGNOSIS — R000 Tachycardia, unspecified: Secondary | ICD-10-CM

## 2022-03-14 DIAGNOSIS — J189 Pneumonia, unspecified organism: Secondary | ICD-10-CM | POA: Diagnosis not present

## 2022-03-14 DIAGNOSIS — I340 Nonrheumatic mitral (valve) insufficiency: Secondary | ICD-10-CM | POA: Diagnosis not present

## 2022-03-14 DIAGNOSIS — I4891 Unspecified atrial fibrillation: Secondary | ICD-10-CM

## 2022-03-14 DIAGNOSIS — R6510 Systemic inflammatory response syndrome (SIRS) of non-infectious origin without acute organ dysfunction: Secondary | ICD-10-CM | POA: Diagnosis not present

## 2022-03-14 DIAGNOSIS — I4892 Unspecified atrial flutter: Secondary | ICD-10-CM

## 2022-03-14 DIAGNOSIS — I088 Other rheumatic multiple valve diseases: Secondary | ICD-10-CM

## 2022-03-14 DIAGNOSIS — Z87891 Personal history of nicotine dependence: Secondary | ICD-10-CM | POA: Diagnosis not present

## 2022-03-14 DIAGNOSIS — I10 Essential (primary) hypertension: Secondary | ICD-10-CM

## 2022-03-14 DIAGNOSIS — R509 Fever, unspecified: Secondary | ICD-10-CM | POA: Diagnosis not present

## 2022-03-14 DIAGNOSIS — I43 Cardiomyopathy in diseases classified elsewhere: Secondary | ICD-10-CM

## 2022-03-14 DIAGNOSIS — C9000 Multiple myeloma not having achieved remission: Secondary | ICD-10-CM | POA: Diagnosis not present

## 2022-03-14 DIAGNOSIS — N1832 Chronic kidney disease, stage 3b: Secondary | ICD-10-CM | POA: Diagnosis not present

## 2022-03-14 HISTORY — PX: CARDIOVERSION: SHX1299

## 2022-03-14 HISTORY — PX: TEE WITHOUT CARDIOVERSION: SHX5443

## 2022-03-14 LAB — CULTURE, BLOOD (ROUTINE X 2)
Culture: NO GROWTH
Culture: NO GROWTH
Special Requests: ADEQUATE

## 2022-03-14 LAB — CBC
HCT: 24.8 % — ABNORMAL LOW (ref 39.0–52.0)
Hemoglobin: 8.1 g/dL — ABNORMAL LOW (ref 13.0–17.0)
MCH: 34.6 pg — ABNORMAL HIGH (ref 26.0–34.0)
MCHC: 32.7 g/dL (ref 30.0–36.0)
MCV: 106 fL — ABNORMAL HIGH (ref 80.0–100.0)
Platelets: 233 10*3/uL (ref 150–400)
RBC: 2.34 MIL/uL — ABNORMAL LOW (ref 4.22–5.81)
RDW: 15.7 % — ABNORMAL HIGH (ref 11.5–15.5)
WBC: 6 10*3/uL (ref 4.0–10.5)
nRBC: 0 % (ref 0.0–0.2)

## 2022-03-14 LAB — PROTIME-INR
INR: 1.4 — ABNORMAL HIGH (ref 0.8–1.2)
Prothrombin Time: 16.6 seconds — ABNORMAL HIGH (ref 11.4–15.2)

## 2022-03-14 LAB — COMPREHENSIVE METABOLIC PANEL
ALT: 31 U/L (ref 0–44)
AST: 34 U/L (ref 15–41)
Albumin: 3 g/dL — ABNORMAL LOW (ref 3.5–5.0)
Alkaline Phosphatase: 71 U/L (ref 38–126)
Anion gap: 10 (ref 5–15)
BUN: 18 mg/dL (ref 8–23)
CO2: 16 mmol/L — ABNORMAL LOW (ref 22–32)
Calcium: 8 mg/dL — ABNORMAL LOW (ref 8.9–10.3)
Chloride: 107 mmol/L (ref 98–111)
Creatinine, Ser: 2.17 mg/dL — ABNORMAL HIGH (ref 0.61–1.24)
GFR, Estimated: 31 mL/min — ABNORMAL LOW (ref >60)
Glucose, Bld: 97 mg/dL (ref 70–99)
Potassium: 4.3 mmol/L (ref 3.5–5.1)
Sodium: 133 mmol/L — ABNORMAL LOW (ref 135–145)
Total Bilirubin: 0.7 mg/dL (ref 0.3–1.2)
Total Protein: 6.4 g/dL — ABNORMAL LOW (ref 6.5–8.1)

## 2022-03-14 LAB — MAGNESIUM: Magnesium: 2.3 mg/dL (ref 1.7–2.4)

## 2022-03-14 SURGERY — ECHOCARDIOGRAM, TRANSESOPHAGEAL
Anesthesia: General

## 2022-03-14 MED ORDER — AMIODARONE HCL 200 MG PO TABS: 200.0000 mg | ORAL_TABLET | Freq: Every day | ORAL | Status: DC

## 2022-03-14 MED ORDER — PHENYLEPHRINE 80 MCG/ML (10ML) SYRINGE FOR IV PUSH (FOR BLOOD PRESSURE SUPPORT)
PREFILLED_SYRINGE | INTRAVENOUS | Status: DC | PRN
  Administered 2022-03-14: 240 ug via INTRAVENOUS
  Administered 2022-03-14: 80 ug via INTRAVENOUS
  Administered 2022-03-14: 160 ug via INTRAVENOUS
  Administered 2022-03-14: 80 ug via INTRAVENOUS
  Administered 2022-03-14: 160 ug via INTRAVENOUS
  Administered 2022-03-14: 240 ug via INTRAVENOUS
  Administered 2022-03-14: 160 ug via INTRAVENOUS

## 2022-03-14 MED ORDER — AMIODARONE HCL 200 MG PO TABS
200.0000 mg | ORAL_TABLET | Freq: Two times a day (BID) | ORAL | Status: DC
  Administered 2022-03-14 – 2022-03-17 (×6): 200 mg via ORAL
  Filled 2022-03-14 (×7): qty 1

## 2022-03-14 MED ORDER — PROPOFOL 500 MG/50ML IV EMUL
INTRAVENOUS | Status: DC | PRN
  Administered 2022-03-14: 150 ug/kg/min via INTRAVENOUS

## 2022-03-14 MED ORDER — LIDOCAINE 2% (20 MG/ML) 5 ML SYRINGE
INTRAMUSCULAR | Status: DC | PRN
  Administered 2022-03-14: 100 mg via INTRAVENOUS

## 2022-03-14 MED ORDER — PROPOFOL 10 MG/ML IV BOLUS
INTRAVENOUS | Status: DC | PRN
  Administered 2022-03-14 (×2): 20 mg via INTRAVENOUS

## 2022-03-14 MED ORDER — SODIUM CHLORIDE 0.9 % IV SOLN
500.0000 mg | INTRAVENOUS | Status: DC
  Administered 2022-03-14 – 2022-03-15 (×2): 500 mg via INTRAVENOUS
  Filled 2022-03-14 (×2): qty 5

## 2022-03-14 MED ORDER — SODIUM CHLORIDE 0.9 % IV SOLN: INTRAVENOUS | Status: DC

## 2022-03-14 MED ORDER — AMIODARONE HCL IN DEXTROSE 360-4.14 MG/200ML-% IV SOLN
INTRAVENOUS | Status: AC
  Filled 2022-03-14: qty 200

## 2022-03-14 MED ORDER — METOPROLOL SUCCINATE ER 50 MG PO TB24
50.0000 mg | ORAL_TABLET | Freq: Every day | ORAL | Status: DC
  Administered 2022-03-15 – 2022-03-17 (×2): 50 mg via ORAL
  Filled 2022-03-14 (×3): qty 1

## 2022-03-14 MED ORDER — SODIUM CHLORIDE 0.9 % IV SOLN
2.0000 g | INTRAVENOUS | Status: DC
  Administered 2022-03-14 – 2022-03-15 (×2): 2 g via INTRAVENOUS
  Filled 2022-03-14 (×2): qty 20

## 2022-03-14 NOTE — Progress Notes (Signed)
Physical Therapy Treatment Patient Details Name: Darren Hinton. MRN: [id number] DOB: 10-27-1945 Today's Date: 03/14/2022   History of Present Illness 76 y.o. male with medical history significant for multiple myeloma on recent chemotherapy (03/03/22), paroxysmal A-fib not oral anticoagulated, essential hypertension, hyperlipidemia, hypothyroidism, mitral valve prolapse, hypothyroidism, chronic anxiety/depression who presented to [facility] ED from home with complaints of having recurrent subjective fevers with Tmax 102 at home. Dx: fever of unclear etiology, atrial fibrillation, s/p TEE cardioversion 03/14/22.    PT Comments    Noted pt had TEE cardioversion this morning. Pt performed seated and standing exercises, HR 115 max with marching in standing. SaO2 89% on room air with standing exercise. Ambulation deferred 2* fatigue with standing activity.     Recommendations for follow up therapy are one component of a multi-disciplinary discharge planning process, led by the attending physician.  Recommendations may be updated based on patient status, additional functional criteria and insurance authorization.  Follow Up Recommendations  No PT follow up     Assistance Recommended at Discharge PRN  Patient can return home with the following A little help with bathing/dressing/bathroom;Assist for transportation;Assistance with cooking/housework   Equipment Recommendations  None recommended by PT    Recommendations for Other Services       Precautions / Restrictions Precautions Precautions: Other (comment) Precaution Comments: monitor HR Restrictions Weight Bearing Restrictions: No     Mobility  Bed Mobility Overal bed mobility: Modified Independent             General bed mobility comments: HOB up, used rail    Transfers Overall transfer level: Needs assistance Equipment used: None Transfers: Sit to/from Stand Sit to Stand: From elevated surface, Supervision            General transfer comment: VCs for hand placement, from elevated surface    Ambulation/Gait Ambulation/Gait assistance: Supervision Gait Distance (Feet): 2 Feet Assistive device: None Gait Pattern/deviations: Step-to pattern       General Gait Details: pt took 3 side steps at EOB and performed marching in place for 1 minute without UE support, HR 115 max with standing activity, standing tolerance limited by SOB, SaO2 89% on room air in standing, 95% on room air at rest   Stairs             Wheelchair Mobility    Modified Rankin (Stroke Patients Only)       Balance Overall balance assessment: Modified Independent                                          Cognition Arousal/Alertness: Awake/alert Behavior During Therapy: WFL for tasks assessed/performed Overall Cognitive Status: Within Functional Limits for tasks assessed                                          Exercises General Exercises - Upper Extremity Shoulder Flexion: AROM, Both, 10 reps, Seated General Exercises - Lower Extremity Long Arc Quad: AROM, Both, 10 reps, Seated Hip Flexion/Marching: AROM, Both, Other reps (comment), Standing (marching for 1 minute (~30 reps))    General Comments        Pertinent Vitals/Pain Pain Assessment Pain Assessment: No/denies pain    Home Living  Prior Function            PT Goals (current goals can now be found in the care plan section) Acute Rehab PT Goals Patient Stated Goal: return to independence PT Goal Formulation: With patient/family Time For Goal Achievement: 03/26/22 Potential to Achieve Goals: Good Progress towards PT goals: Progressing toward goals    Frequency    Min 3X/week      PT Plan Current plan remains appropriate    Co-evaluation              AM-PAC PT "6 Clicks" Mobility   Outcome Measure  Help needed turning from your back to your side while in a  flat bed without using bedrails?: None Help needed moving from lying on your back to sitting on the side of a flat bed without using bedrails?: None Help needed moving to and from a bed to a chair (including a wheelchair)?: None Help needed standing up from a chair using your arms (e.g., wheelchair or bedside chair)?: None Help needed to walk in hospital room?: None Help needed climbing 3-5 steps with a railing? : A Little 6 Click Score: 23    End of Session   Activity Tolerance: Patient limited by fatigue Patient left: in bed;with bed alarm set;with call bell/phone within reach;with family/visitor present Nurse Communication: Mobility status PT Visit Diagnosis: Difficulty in walking, not elsewhere classified (R26.2)     Time: 1303-1320 PT Time Calculation (min) (ACUTE ONLY): 17 min  Charges:  $Therapeutic Exercise: 8-22 mins                     Uhlenberg, Jennifer Kistler PT 03/14/2022  Acute Rehabilitation Services  Office [phone]   

## 2022-03-14 NOTE — Anesthesia Procedure Notes (Signed)
Procedure Name: MAC Date/Time: 03/14/2022 10:20 AM  Performed by: Dorann Lodge, CRNAPre-anesthesia Checklist: Patient identified, Emergency Drugs available, Suction available and Patient being monitored Oxygen Delivery Method: Nasal cannula Airway Equipment and Method: Bite block Dental Injury: Teeth and Oropharynx as per pre-operative assessment

## 2022-03-14 NOTE — Progress Notes (Addendum)
Progress Note  Patient Name: Darren Hinton. Date of Encounter: 03/14/2022  [facility] Cardiologist: New (Lecretia Buczek)   Subjective   Seen shortly after TEE cardioversion. No evidence of endocarditis or left atrial thrombus.  Despite successful cardioversion x2, had early recurrence of atrial flutter within seconds. No complications. Feels well. Rate control improved with resting ventricular rates in the 90s. Fever lower, peak 101 F last night.  Currently afebrile.  Inpatient Medications    Scheduled Meds:  [MAR Hold] apixaban  5 mg Oral BID   [MAR Hold] cyanocobalamin  1,000 mcg Intramuscular Daily   [MAR Hold] ferrous gluconate  324 mg Oral TID WC   [MAR Hold] guaiFENesin  1,200 mg Oral BID   [MAR Hold] levothyroxine  100 mcg Oral QAC breakfast   [MAR Hold] metoprolol succinate  25 mg Oral Daily   [MAR Hold] senna-docusate  2 tablet Oral BID   Continuous Infusions:  sodium chloride Stopped (03/14/22 1055)   amiodarone 30 mg/hr (03/14/22 1012)   [MAR Hold] ceFEPime (MAXIPIME) IV 2 g (03/14/22 0820)   PRN Meds: [MAR Hold] acetaminophen, [MAR Hold]  HYDROmorphone (DILAUDID) injection, [MAR Hold] melatonin, [MAR Hold] ondansetron (ZOFRAN) IV, [MAR Hold] mouth rinse, [MAR Hold] oxyCODONE, [MAR Hold] polyethylene glycol   Vital Signs    Vitals:   03/14/22 1100 03/14/22 1110 03/14/22 1120 03/14/22 1130  BP: 110/79 112/77 111/81 (!) 125/92  Pulse: 97 91 97 (!) 106  Resp: (!) 22 (!) '25 16 17  ' Temp:      TempSrc:      SpO2: 95% 96% 98% 97%  Weight:      Height:        Intake/Output Summary (Last 24 hours) at 03/14/2022 1211 Last data filed at 03/14/2022 1041 Gross per 24 hour  Intake 804.56 ml  Output --  Net 804.56 ml      03/14/2022   10:00 AM 03/10/2022   12:20 AM 03/09/2022    8:29 PM  Last 3 Weights  Weight (lbs) 216 lb 217 lb 9.5 oz 218 lb  Weight (kg) 97.977 kg 98.7 kg 98.884 kg      Telemetry    Atypical atrial flutter with controlled rate, brief  periods of rapid ventricular response- Personally Reviewed  ECG    No new tracing- Personally Reviewed  Physical Exam  Appears well.  Lying fully supine in bed without difficulty in respiration. GEN: No acute distress.   Neck: No JVD Cardiac: Irregular, no murmurs, rubs, or gallops.  Respiratory: Clear to auscultation bilaterally. GI: Soft, nontender, non-distended  MS: No edema; No deformity. Neuro:  Nonfocal  Psych: Normal affect   Labs    High Sensitivity Troponin:   Recent Labs  Lab 03/11/22 1422 03/11/22 1555  TROPONINIHS 22* 21*     Chemistry Recent Labs  Lab 03/12/22 0642 03/13/22 0417 03/14/22 0848  NA 135 136 133*  K 4.2 4.5 4.3  CL 110 110 107  CO2 19* 18* 16*  GLUCOSE 103* 105* 97  BUN '19 20 18  ' CREATININE 2.04* 2.27* 2.17*  CALCIUM 7.7* 8.0* 8.0*  MG 2.4 2.2 2.3  PROT 5.8* 6.2* 6.4*  ALBUMIN 2.9* 3.0* 3.0*  AST 31 29 34  ALT '25 25 31  ' ALKPHOS 69 71 71  BILITOT 0.7 0.6 0.7  GFRNONAA 33* 29* 31*  ANIONGAP '6 8 10    ' Lipids No results for input(s): "CHOL", "TRIG", "HDL", "LABVLDL", "LDLCALC", "CHOLHDL" in the last 168 hours.  Hematology Recent Labs  Lab  03/12/22 7939 03/12/22 0644 03/13/22 0417 03/14/22 0452  WBC 3.7*  --  4.8 6.0  RBC 2.24* 2.30* 2.27* 2.34*  HGB 7.7*  --  7.8* 8.1*  HCT 23.7*  --  24.2* 24.8*  MCV 105.8*  --  106.6* 106.0*  MCH 34.4*  --  34.4* 34.6*  MCHC 32.5  --  32.2 32.7  RDW 15.1  --  15.3 15.7*  PLT 141*  --  204 233   Thyroid  Recent Labs  Lab 03/12/22 0644  TSH 3.629    BNPNo results for input(s): "BNP", "PROBNP" in the last 168 hours.  DDimer No results for input(s): "DDIMER" in the last 168 hours.   Radiology    ECHO TEE  Result Date: 03/14/2022    TRANSESOPHOGEAL ECHO REPORT   Patient Name:   Darren Hinton. Date of Exam: 03/14/2022 Medical Rec #:  [id number]           Height:       77.0 in Accession #:    [id number]          Weight:       217.6 lb Date of Birth:  [birth year]          BSA:           2.318 m Patient Age:    76 years            BP:           111/81 mmHg Patient Gender: M                   HR:           90 bpm. Exam Location:  Inpatient Procedure: Transesophageal Echo, Cardiac Doppler and Color Doppler Indications:     A flutter  History:         Patient has prior history of Echocardiogram examinations, most                  recent 03/12/2022. Mitral Valve Disease, Arrythmias:Atrial                  Fibrillation; Risk Factors:Dyslipidemia and Hypertension.                  Hypothyroidism.  Sonographer:     Eartha Inch Referring Phys:  [id number] HAO MENG Diagnosing Phys: Lyman Bishop MD PROCEDURE: After discussion of the risks and benefits of a TEE, an informed consent was obtained from the patient. TEE procedure time was 8 minutes. The transesophogeal probe was passed without difficulty through the esophogus of the patient. Imaged were  obtained with the patient in a supine position. Sedation performed by different physician. Image quality was good. The patient developed no complications during the procedure. 1. Cardioverted 4 time(s). 2. Cardioverted at 120J, 150J and 200J x 2 biphasic. Converted to sinus after the initial shock, then back in afib- converted to sinus again for a few seconds with 200J, then back in afib. IMPRESSIONS  1. Left ventricular ejection fraction, by estimation, is 50 to 55%. The left ventricle has low normal function. There is mild left ventricular hypertrophy.  2. Right ventricular systolic function is normal. The right ventricular size is normal.  3. No left atrial/left atrial appendage thrombus was detected.  4. The mitral valve is grossly normal. Mild mitral valve regurgitation.  5. The aortic valve is tricuspid. Aortic valve regurgitation is not visualized.  6. 1. Cardioverted 4 time(s).     2. Cardioverted  at 120J, 150J and 200J x 2 biphasic. Converted to sinus after the initial shock, then back in afib- converted to sinus again for a few seconds with 200J, then  back in afib. Conclusion(s)/Recommendation(s): No evidence of vegetation/infective endocarditis on this transesophageael echocardiogram. No LA/LAA thrombus identified. Unsuccessful cardioversion performed without restoration of normal sinus rhythm. FINDINGS  Left Ventricle: Left ventricular ejection fraction, by estimation, is 50 to 55%. The left ventricle has low normal function. The left ventricular internal cavity size was normal in size. There is mild left ventricular hypertrophy. Right Ventricle: The right ventricular size is normal. No increase in right ventricular wall thickness. Right ventricular systolic function is normal. Left Atrium: Left atrial size was normal in size. No left atrial/left atrial appendage thrombus was detected. Right Atrium: Right atrial size was normal in size. Pericardium: There is no evidence of pericardial effusion. Mitral Valve: The mitral valve is grossly normal. Mild mitral valve regurgitation, with multiple jets. Tricuspid Valve: The tricuspid valve is grossly normal. Tricuspid valve regurgitation is trivial. Aortic Valve: The aortic valve is tricuspid. Aortic valve regurgitation is not visualized. Pulmonic Valve: The pulmonic valve was normal in structure. Pulmonic valve regurgitation is trivial. Aorta: The aortic root and ascending aorta are structurally normal, with no evidence of dilitation. IAS/Shunts: No atrial level shunt detected by color flow Doppler.   AORTA Ao Asc diam: 3.90 cm Lyman Bishop MD Electronically signed by Lyman Bishop MD Signature Date/Time: 03/14/2022/11:53:24 AM    Final (Updated)    ECHOCARDIOGRAM COMPLETE  Result Date: 03/12/2022    ECHOCARDIOGRAM REPORT   Patient Name:   Darren Hinton. Date of Exam: 03/12/2022 Medical Rec #:  [id number]           Height:       77.0 in Accession #:    [id number]          Weight:       217.6 lb Date of Birth:  15-Mar-1946          BSA:          2.318 m Patient Age:    42 years            BP:           108/84 mmHg  Patient Gender: M                   HR:           128 bpm. Exam Location:  Inpatient Procedure: 2D Echo, Cardiac Doppler, Color Doppler and Intracardiac            Opacification Agent Indications:    Atrial Flutter I48.92  History:        Patient has no prior history of Echocardiogram examinations.                 Risk Factors:Hypertension and Dyslipidemia. Hypothyroidism.                 Fever due to pneumonia. Chronic kidney disease, anemia.  Sonographer:    Darlina Sicilian RDCS Referring Phys: [id number] [place]  1. Left ventricular ejection fraction, by estimation, is 40 to 45%. The left ventricle has mildly decreased function. The left ventricle has no regional wall motion abnormalities. There is mild concentric left ventricular hypertrophy. Left ventricular diastolic parameters are consistent with Grade I diastolic dysfunction (impaired relaxation).  2. Right ventricular systolic function is normal. The right ventricular size is normal. There is mildly elevated pulmonary  artery systolic pressure.  3. The mitral valve is normal in structure. Mild mitral valve regurgitation. No evidence of mitral stenosis.  4. The aortic valve is tricuspid. Aortic valve regurgitation is trivial. Aortic valve sclerosis is present, with no evidence of aortic valve stenosis.  5. The inferior vena cava is normal in size with greater than 50% respiratory variability, suggesting right atrial pressure of 3 mmHg. FINDINGS  Left Ventricle: Left ventricular ejection fraction, by estimation, is 40 to 45%. The left ventricle has mildly decreased function. The left ventricle has no regional wall motion abnormalities. Definity contrast agent was given IV to delineate the left ventricular endocardial borders. The left ventricular internal cavity size was normal in size. There is mild concentric left ventricular hypertrophy. Left ventricular diastolic parameters are consistent with Grade I diastolic dysfunction (impaired  relaxation). Right Ventricle: The right ventricular size is normal. No increase in right ventricular wall thickness. Right ventricular systolic function is normal. There is mildly elevated pulmonary artery systolic pressure. The tricuspid regurgitant velocity is 2.66  m/s, and with an assumed right atrial pressure of 8 mmHg, the estimated right ventricular systolic pressure is 99.3 mmHg. Left Atrium: Left atrial size was normal in size. Right Atrium: Right atrial size was normal in size. Pericardium: There is no evidence of pericardial effusion. Mitral Valve: The mitral valve is normal in structure. Mild mitral valve regurgitation. No evidence of mitral valve stenosis. Tricuspid Valve: The tricuspid valve is normal in structure. Tricuspid valve regurgitation is not demonstrated. No evidence of tricuspid stenosis. Aortic Valve: The aortic valve is tricuspid. Aortic valve regurgitation is trivial. Aortic valve sclerosis is present, with no evidence of aortic valve stenosis. Pulmonic Valve: The pulmonic valve was normal in structure. Pulmonic valve regurgitation is not visualized. No evidence of pulmonic stenosis. Aorta: The aortic root is normal in size and structure. Venous: The inferior vena cava is normal in size with greater than 50% respiratory variability, suggesting right atrial pressure of 3 mmHg. IAS/Shunts: No atrial level shunt detected by color flow Doppler.  LEFT VENTRICLE PLAX 2D LVIDd:         4.60 cm      Diastology LVIDs:         3.30 cm      LV e' medial:    7.94 cm/s LV PW:         1.20 cm      LV E/e' medial:  13.1 LV IVS:        1.30 cm      LV e' lateral:   12.85 cm/s LVOT diam:     2.10 cm      LV E/e' lateral: 8.1 LV SV:         38 LV SV Index:   16 LVOT Area:     3.46 cm  LV Volumes (MOD) LV vol d, MOD A2C: 114.0 ml LV vol d, MOD A4C: 117.0 ml LV vol s, MOD A2C: 61.9 ml LV vol s, MOD A4C: 54.5 ml LV SV MOD A2C:     52.1 ml LV SV MOD A4C:     117.0 ml LV SV MOD BP:      54.9 ml RIGHT VENTRICLE  RV S prime:     14.80 cm/s LEFT ATRIUM             Index        RIGHT ATRIUM           Index LA diam:  4.10 cm 1.77 cm/m   RA Area:     13.20 cm LA Vol (A2C):   63.1 ml 27.22 ml/m  RA Volume:   31.60 ml  13.63 ml/m LA Vol (A4C):   70.4 ml 30.37 ml/m LA Biplane Vol: 72.2 ml 31.14 ml/m  AORTIC VALVE LVOT Vmax:   80.00 cm/s LVOT Vmean:  53.600 cm/s LVOT VTI:    0.109 m  AORTA Ao Root diam: 3.50 cm Ao Asc diam:  3.00 cm MITRAL VALVE                TRICUSPID VALVE MV Area (PHT): 4.19 cm     TR Peak grad:   28.3 mmHg MV Decel Time: 181 msec     TR Vmax:        266.00 cm/s MV E velocity: 103.75 cm/s                             SHUNTS                             Systemic VTI:  0.11 m                             Systemic Diam: 2.10 cm Kardie Tobb DO Electronically signed by Berniece Salines DO Signature Date/Time: 03/12/2022/2:19:52 PM    Final     Cardiac Studies   TEE 03/14/2022  1. Left ventricular ejection fraction, by estimation, is 50 to 55%. The left ventricle has low normal function. There is mild left ventricular hypertrophy.   2. Right ventricular systolic function is normal. The right ventricular size is normal.   3. No left atrial/left atrial appendage thrombus was detected.   4. The mitral valve is grossly normal. Mild mitral valve regurgitation.  5. The aortic valve is tricuspid. Aortic valve regurgitation is not visualized.   6. 1. Cardioverted 4 time(s).     2. Cardioverted at 120J, 150J and 200J x 2 biphasic. Converted to sinus after the initial shock, then back in afib- converted to sinus again for a few seconds with 200J, then back in afib. Conclusion(s)/Recommendation(s): No evidence of vegetation/infective endocarditis on this transesophageael echocardiogram. No LA/LAA thrombus identified. Unsuccessful cardioversion performed without restoration of normal sinus rhythm.   Patient Profile     76 y.o. male with multiple myeloma currently receiving CyBorD chemotherapy (completed 4 of 7  cycles) admitted with acute febrile illness and atypical atrial flutter/atrial fibrillation rapid ventricular response.  Initial echo showed decrease in LVEF to 40-45%.  After rate control TEE showed EF 50-55%, no thrombus, no vegetations.  Cardioversion was unsuccessful.  Assessment & Plan    Persistent atypical atrial flutter with RVR: Had a previous episode, before the diagnosis of multiple myeloma, roughly 3 years ago.  This makes it less likely that the arrhythmia cardiomyopathy are related to bortezomib or cyclophosphamide, but not impossible.  Due to relatively low blood pressure, required intravenous amiodarone for rate control.  Despite roughly 3 days of IV amiodarone, cardioversion was associated with only brief return of normal rhythm, followed by recurrence of arrhythmia.  CHA2DS2-VASc score is 4 (age 38, HTN, decreased LVEF).  Started on Eliquis.  Plan another attempt at cardioversion after 3 weeks of oral amiodarone and uninterrupted oral anticoagulant.  Has normal baseline TSH, LFTs and moderately abnormal renal function.   Anemia: Hemoglobin stable  at approximately 8.0.  Multifactorial.  He has macrocytosis but clear evidence of iron deficiency anemia on labs.  Relatively benign findings on colonoscopy in 2019 (small nonbleeding area of angiodysplasia, internal hemorrhoids).  No overt GI bleeding.  Needs iron supplementation.  Has follow-up with Dr. [name] in heme-onc clinic   Left ventricular systolic dysfunction: Thankfully without overt congestive heart failure.  Most likely tachycardia related, but need to consider possibility of chemotherapy related cardiotoxicity.  Repeat echo in a few weeks.     Multiple myeloma: Recently started on CyBorD.  Important to confirm complete resolution of left ventricular systolic dysfunction before this is restarted..   Hypertension: Was hypotensive during this admission so amlodipine and ARB/HCTZ has been discontinued and his dose of metoprolol  succinate has been decreased.  Will increase metoprolol to 50 mg daily since blood pressure has recovered.   Fever of uncertain origin: Blood cultures negative.  Immunosuppressed due to chemotherapy.  Fairly subtle abnormalities on chest x-ray that might represent atypical pneumonia, but he does not have cough or hypoxemia.  No evidence of endocarditis by TEE.  CKD 3B: Renal parameters seem to be at baseline  Hypothyroidism: Normal TSH on current dose of levothyroxine supplement.  [facility] will sign off.   Medication Recommendations:  - Eliquis 5 mg twice daily (new)  - Amiodarone 200 mg twice daily for 2 weeks, then 200 mg daily (new) - Metoprolol succinate 50 mg once daily (new dose) May resume losartan if blood pressure allows. Other recommendations (labs, testing, etc): If he remains on long-term amiodarone, plan repeat thyroid function tests and liver function tests at least every 6 months.  Repeat echocardiogram in approximately 2-3 weeks, preferably before repeat attempted cardioversion Follow up as an outpatient: We will make arrangements for a follow-up visit in A-fib clinic in 2-3 weeks, followed by repeat attempt at cardioversion if he remains in atrial flutter.  For questions or updates, please contact [place] Please consult [email or web address] for contact info under        Signed, Sanda Klein, MD  03/14/2022, 12:11 PM

## 2022-03-14 NOTE — Progress Notes (Signed)
PROGRESS NOTE  [name]. TDH:[id number] DOB: Feb 05, 1946 DOA: 03/09/2022 PCP: [name], MD   LOS: 5 days   Brief Narrative / Interim history: 76 year old male with multiple myeloma recently had chemotherapy 6/26, PAF not on anticoagulation, HTN, HLD, hypothyroidism, comes into the hospital with subjective fevers.  Imaging was concerning for developing pneumonia, he was placed on antibiotics and was admitted to the hospital.  He developed A-fib with RVR  Subjective / 24h Interval events: Febrile last night 101, complains of sweats..  No chest pain, no palpitations.  No abdominal pain, no nausea or vomiting  Assesement and Plan: Principal problem Fever due to presumed pneumonia-intermittently febrile still-evening.  So far cultures are without growth.  Continue cefepime.  He does not really have significant respiratory symptoms so source may be different.  Monitor cultures.  COVID and RVP negative.  Procalcitonin was elevated.  Due to persistent fevers ID consulted today, appreciate input.  Active problems PAF, A-fib with RVR-patient placed on anticoagulation with heparin, amiodarone infusion.  Cardiology consulted, he is scheduled for TEE cardioversion this morning  Hypovolemic hyponatremia-sodium borderline low at 133 this morning, monitor  Hypokalemia, hypophosphatemia-monitor and replete as indicated.  Unremarkable this morning  CKD 3B-appears to be close to baseline in the 2 range.  Monitor, creatinine 2.1 today  Anemia of chronic disease, macrocytic anemia-B12 borderline on the lower side of normal, will replete.  He is also iron deficient, placed on iron.  He also has underlying malignancy  Multiple myeloma, recent chemotherapy-outpatient management  Thrombocytopenia-monitor, platelets are now normal  Hypothyroidism-continue Synthroid.  TSH unremarkable  Scheduled Meds:  [MAR Hold] apixaban  5 mg Oral BID   [MAR Hold] cyanocobalamin  1,000 mcg Intramuscular  Daily   [MAR Hold] ferrous gluconate  324 mg Oral TID WC   [MAR Hold] guaiFENesin  1,200 mg Oral BID   [MAR Hold] levothyroxine  100 mcg Oral QAC breakfast   [MAR Hold] metoprolol succinate  25 mg Oral Daily   [MAR Hold] senna-docusate  2 tablet Oral BID   Continuous Infusions:  sodium chloride     amiodarone 30 mg/hr (03/14/22 1012)   [MAR Hold] ceFEPime (MAXIPIME) IV 2 g (03/14/22 0820)   PRN Meds:.[MAR Hold] acetaminophen, [MAR Hold]  HYDROmorphone (DILAUDID) injection, [MAR Hold] melatonin, [MAR Hold] ondansetron (ZOFRAN) IV, [MAR Hold] mouth rinse, [MAR Hold] oxyCODONE, [MAR Hold] polyethylene glycol  Diet Orders (From admission, onward)     Start     Ordered   03/14/22 1004  Diet NPO time specified Except for: Sips with Meds  Diet effective midnight       Comments: Except medications  Question:  Except for  Answer:  Sips with Meds   03/14/22 1003            DVT prophylaxis: SCDs Start: 03/09/22 2311 apixaban (ELIQUIS) tablet 5 mg   Lab Results  Component Value Date   PLT 233 03/14/2022      Code Status: Full Code  Family Communication: wife at bedside  Status is: Inpatient  Remains inpatient appropriate because: Severity of illness   Level of care: Progressive  Consultants:  Cardiology   Objective: Vitals:   03/14/22 0701 03/14/22 0816 03/14/22 0853 03/14/22 1000  BP: 104/76 105/73  103/83  Pulse:  93 95 (!) 119  Resp: 20   19  Temp:   98.3 F (36.8 C) 98.1 F (36.7 C)  TempSrc:   Oral Temporal  SpO2:   98% 97%  Weight:  98 kg  Height:    '6\' 5"'  (1.956 m)    Intake/Output Summary (Last 24 hours) at 03/14/2022 1037 Last data filed at 03/14/2022 0508 Gross per 24 hour  Intake 624.56 ml  Output 400 ml  Net 224.56 ml    Wt Readings from Last 3 Encounters:  03/14/22 98 kg  03/08/22 98.9 kg  03/03/22 99.2 kg    Examination:  Constitutional: NAD Eyes: lids and conjunctivae normal, no scleral icterus ENMT: mmm Neck: normal,  supple Respiratory: clear to auscultation bilaterally, no wheezing, no crackles. Normal respiratory effort.  Cardiovascular: Regular rate and rhythm, no murmurs / rubs / gallops. No LE edema. Abdomen: soft, no distention, no tenderness. Bowel sounds positive.  Skin: no rashes Neurologic: no focal deficits   Data Reviewed: I have independently reviewed following labs and imaging studies   CBC Recent Labs  Lab 03/08/22 2246 03/09/22 2037 03/10/22 0401 03/11/22 0356 03/12/22 0642 03/13/22 0417 03/14/22 0452  WBC 3.6* 4.3 5.7 3.4* 3.7* 4.8 6.0  HGB 9.2* 8.6* 9.1* 8.4* 7.7* 7.8* 8.1*  HCT 28.3* 25.5* 28.0* 26.1* 23.7* 24.2* 24.8*  PLT 124* 99* 98* 112* 141* 204 233  MCV 106.4* 104.5* 108.1* 107.9* 105.8* 106.6* 106.0*  MCH 34.6* 35.2* 35.1* 34.7* 34.4* 34.4* 34.6*  MCHC 32.5 33.7 32.5 32.2 32.5 32.2 32.7  RDW 14.2 14.3 14.5 14.6 15.1 15.3 15.7*  LYMPHSABS 0.2* 0.3* 0.3* 0.3* 0.5*  --   --   MONOABS 0.5 0.4 0.6 0.3 0.4  --   --   EOSABS 0.0 0.0 0.0 0.1 0.2  --   --   BASOSABS 0.0 0.0 0.0 0.0 0.0  --   --      Recent Labs  Lab 03/08/22 2246 03/08/22 2246 03/08/22 2314 03/09/22 2037 03/10/22 0401 03/11/22 0356 03/11/22 1425 03/12/22 0642 03/12/22 0644 03/13/22 0109 03/13/22 0417 03/14/22 0848  NA 139  --   --  134* 136 137  --  135  --   --  136 133*  K 3.6  --   --  3.4* 3.6 4.1  --  4.2  --   --  4.5 4.3  CL 113*  --   --  108 110 110  --  110  --   --  110 107  CO2 18*  --   --  18* 18* 20*  --  19*  --   --  18* 16*  GLUCOSE 140*  --   --  106* 100* 115*  --  103*  --   --  105* 97  BUN 20  --   --  '19 20 20  ' --  19  --   --  20 18  CREATININE 1.98*  --   --  2.01* 1.99* 2.12*  --  2.04*  --   --  2.27* 2.17*  CALCIUM 8.7*  --   --  7.9* 7.9* 8.0*  --  7.7*  --   --  8.0* 8.0*  AST 17  --   --  25 30 41  --  31  --   --  29 34  ALT 14  --   --  '15 18 26  ' --  25  --   --  25 31  ALKPHOS 95  --   --  76 73 70  --  69  --   --  71 71  BILITOT 1.1  --   --  1.0 1.0  0.7  --  0.7  --   --  0.6 0.7  ALBUMIN 3.8  --   --  3.6 3.4* 3.0*  --  2.9*  --   --  3.0* 3.0*  MG  --    < >  --  2.2 1.8 2.6*  --  2.4  --   --  2.2 2.3  PROCALCITON  --   --   --   --   --   --  1.80 1.48  --   --  1.48  --   LATICACIDVEN 1.2  --   --  0.9  --   --   --   --   --  1.2  --   --   INR  --   --  1.1 1.1  --   --   --   --   --   --   --   --   TSH  --   --   --   --   --   --   --   --  3.629  --   --   --    < > = values in this interval not displayed.     ------------------------------------------------------------------------------------------------------------------ No results for input(s): "CHOL", "HDL", "LDLCALC", "TRIG", "CHOLHDL", "LDLDIRECT" in the last 72 hours.  No results found for: "HGBA1C" ------------------------------------------------------------------------------------------------------------------ Recent Labs    03/12/22 0644  TSH 3.629     Cardiac Enzymes No results for input(s): "CKMB", "TROPONINI", "MYOGLOBIN" in the last 168 hours.  Invalid input(s): "CK" ------------------------------------------------------------------------------------------------------------------ No results found for: "BNP"  CBG: No results for input(s): "GLUCAP" in the last 168 hours.  Recent Results (from the past 240 hour(s))  SARS Coronavirus 2 by RT PCR (hospital order, performed in [facility] hospital lab) *cepheid single result test* Anterior Nasal Swab     Status: None   Collection Time: 03/08/22 11:13 PM   Specimen: Anterior Nasal Swab  Result Value Ref Range Status   SARS Coronavirus 2 by RT PCR NEGATIVE NEGATIVE Final    Comment: (NOTE) SARS-CoV-2 target nucleic acids are NOT DETECTED.  The SARS-CoV-2 RNA is generally detectable in upper and lower respiratory specimens during the acute phase of infection. The lowest concentration of SARS-CoV-2 viral copies this assay can detect is 250 copies / mL. A negative result does not preclude SARS-CoV-2  infection and should not be used as the sole basis for treatment or other patient management decisions.  A negative result may occur with improper specimen collection / handling, submission of specimen other than nasopharyngeal swab, presence of viral mutation(s) within the areas targeted by this assay, and inadequate number of viral copies (<250 copies / mL). A negative result must be combined with clinical observations, patient history, and epidemiological information.  Fact Sheet for Patients:   https://www.patel.info/  Fact Sheet for Healthcare Providers: https://hall.com/  This test is not yet approved or  cleared by the [place] FDA and has been authorized for detection and/or diagnosis of SARS-CoV-2 by FDA under an Emergency Use Authorization (EUA).  This EUA will remain in effect (meaning this test can be used) for the duration of the COVID-19 declaration under Section 564(b)(1) of the Act, 21 U.S.C. section 360bbb-3(b)(1), unless the authorization is terminated or revoked sooner.  Performed at [facility], [place] [address], [place], [place] [address]   Blood culture (routine x 2)     Status: None (Preliminary result)   Collection Time: 03/08/22 11:14 PM   Specimen: BLOOD  Result Value Ref Range Status   Specimen Description   Final    BLOOD Performed at [place] [address] [place], [place] [address]    Special Requests   Final    BOTTLES DRAWN AEROBIC AND ANAEROBIC Blood Culture adequate volume Performed at [facility] [address], [place], [place] [address]    Culture   Final    NO GROWTH 4 DAYS Performed at [facility], [place] [address] [place], [place] [address]    Report Status PENDING  Incomplete  Blood culture (routine x 2)     Status: None (Preliminary result)   Collection Time: 03/08/22 11:50 PM   Specimen: BLOOD  Result Value Ref  Range Status   Specimen Description   Final    BLOOD LEFT ANTECUBITAL Performed at [facility] [address] [place], [place] [address]    Special Requests   Final    BOTTLES DRAWN AEROBIC AND ANAEROBIC Blood Culture results may not be optimal due to an excessive volume of blood received in culture bottles Performed at [facility] [address] [place], [place] [address]    Culture   Final    NO GROWTH 4 DAYS Performed at [facility], [place] [address], [place], [place] [address]    Report Status PENDING  Incomplete  Urine Culture     Status: Abnormal   Collection Time: 03/09/22 12:04 AM   Specimen: Urine, Clean Catch  Result Value Ref Range Status   Specimen Description   Final    URINE, CLEAN CATCH Performed at [facility], [place] [address], [place], [place] [address]    Special Requests   Final    NONE Performed at [facility], [place] [address] [place], [place] [address]    Culture (A)  Final    <10,000 COLONIES/mL INSIGNIFICANT GROWTH Performed at [place] [facility] [address], [place], [place] [address]    Report Status 03/10/2022 FINAL  Final  Culture, blood (Routine x 2)     Status: None (Preliminary result)   Collection Time: 03/09/22  8:37 PM   Specimen: BLOOD  Result Value Ref Range Status   Specimen Description   Final    BLOOD LEFT ANTECUBITAL Performed at [facility] [address] [place], [place] [address]    Special Requests   Final    BOTTLES DRAWN AEROBIC AND ANAEROBIC Blood Culture results may not be optimal due to an excessive volume of blood received in culture bottles Performed at [facility] [address], [place], [place] [address]    Culture   Final    NO GROWTH 3 DAYS Performed at [facility], [place] [address] [place], [place] [address]    Report Status PENDING  Incomplete  Culture, blood (Routine x 2)      Status: None (Preliminary result)   Collection Time: 03/09/22  8:40 PM   Specimen: BLOOD  Result Value Ref Range Status   Specimen Description   Final    BLOOD BLOOD LEFT HAND Performed at [facility] [address] [place], [place] [address]    Special Requests   Final    BOTTLES DRAWN AEROBIC AND ANAEROBIC Blood Culture results may not be optimal due to an inadequate volume of blood received in culture bottles Performed at [facility] [place] [address], [place], [place] [address]  Culture   Final    NO GROWTH 3 DAYS Performed at [facility], [place] [address], [place], [place] [address]    Report Status PENDING  Incomplete  MRSA Next Gen by PCR, Nasal     Status: None   Collection Time: 03/10/22 12:17 PM   Specimen: Nasal Mucosa; Nasal Swab  Result Value Ref Range Status   MRSA by PCR Next Gen NOT DETECTED NOT DETECTED Final    Comment: (NOTE) The GeneXpert MRSA Assay (FDA approved for NASAL specimens only), is one component of a comprehensive MRSA colonization surveillance program. It is not intended to diagnose MRSA infection nor to guide or monitor treatment for MRSA infections. Test performance is not FDA approved in patients less than 68 years old. Performed at [facility], [place] [address] [place], [place] [address]   Respiratory (~20 pathogens) panel by PCR     Status: None   Collection Time: 03/11/22  1:21 PM   Specimen: Nasopharyngeal Swab; Respiratory  Result Value Ref Range Status   Adenovirus NOT DETECTED NOT DETECTED Final   Coronavirus 229E NOT DETECTED NOT DETECTED Final    Comment: (NOTE) The Coronavirus on the Respiratory Panel, DOES NOT test for the novel  Coronavirus (2019 nCoV)    Coronavirus HKU1 NOT DETECTED NOT DETECTED Final   Coronavirus NL63 NOT DETECTED NOT DETECTED Final   Coronavirus OC43 NOT DETECTED NOT DETECTED Final   Metapneumovirus NOT DETECTED NOT DETECTED Final    Rhinovirus / Enterovirus NOT DETECTED NOT DETECTED Final   Influenza A NOT DETECTED NOT DETECTED Final   Influenza B NOT DETECTED NOT DETECTED Final   Parainfluenza Virus 1 NOT DETECTED NOT DETECTED Final   Parainfluenza Virus 2 NOT DETECTED NOT DETECTED Final   Parainfluenza Virus 3 NOT DETECTED NOT DETECTED Final   Parainfluenza Virus 4 NOT DETECTED NOT DETECTED Final   Respiratory Syncytial Virus NOT DETECTED NOT DETECTED Final   Bordetella pertussis NOT DETECTED NOT DETECTED Final   Bordetella Parapertussis NOT DETECTED NOT DETECTED Final   Chlamydophila pneumoniae NOT DETECTED NOT DETECTED Final   Mycoplasma pneumoniae NOT DETECTED NOT DETECTED Final    Comment: Performed at [facility], [place] [address] [place], [place] [address]     Radiology Studies: No results found.   Marzetta Board, MD, PhD [facility]  Between 7 am - 7 pm I am available, please contact me via Amion (for emergencies) or Securechat (non urgent messages)  Between 7 pm - 7 am I am not available, please contact night coverage MD/APP via Amion

## 2022-03-14 NOTE — Interval H&P Note (Signed)
History and Physical Interval Note:  03/14/2022 8:45 AM  Darren Hinton.  has presented today for surgery, with the diagnosis of AFLUTTER.  The various methods of treatment have been discussed with the patient and family. After consideration of risks, benefits and other options for treatment, the patient has consented to  Procedure(s): TRANSESOPHAGEAL ECHOCARDIOGRAM (TEE) (N/A) CARDIOVERSION (N/A) as a surgical intervention.  The patient's history has been reviewed, patient examined, no change in status, stable for surgery.  I have reviewed the patient's chart and labs.  Questions were answered to the patient's satisfaction.     Pixie Casino

## 2022-03-14 NOTE — Consult Note (Addendum)
[facility] [place] [facility]                                                                                        Patient Identification: Patient Name: Darren Hinton. MRN: [id number] Admit Date: 03/09/2022  8:25 PM Today's Date: 03/14/2022 Reason for consult: Fevers Requesting provider: Alferd Patee  Principal Problem:   Fever Active Problems:   Atrial fibrillation with RVR (HCC)   SIRS (systemic inflammatory response syndrome) (HCC)   Antibiotics:  Ceftriaxone 7/1 Cefepime 7/2-c  Lines/Hardware:  Assessment # Fevers, unclear cause Complains of burning in eyes, mild runny nose, sore throat after TEE this morning, mild dry cough and SOB. Chest xray 7/4 and 7/5 with non specific findings ( edema vs atelectasis, infection). On room air  Mild stinging with urination at the beginning but no dysuria and increased frequency. 7/2 UA on 7/3 UA unremarkable for UTI, urine cultures 7/2 with insignificant growth  No diarrhea or central lines   7/1 SARScov2 negative 7/2 RVP negative  7/1, 7/2 7/6 blood cultures with no growth   # Multiple Myeloma - following Oncology OP, on CyBorD chemotherapy # Persistent A Fibrillation w RVR - s/p cardioversion by Cardiology. On AC, amiodarone # CKD  Recommendations  - Will switch cefepime to ceftriaxone and add azithromycin for atypical pna coverage. Cough is mostly dry, doubt will be able to get sputum samples.  - Strep pneumoniae ag and legionella urine ag ordered   - CT chest abd pelvis WO contrast  to look for occult abscesses causing fevers - Fu blood cultures  - Monitor fevers, CBC  Dr Gale Journey covering this weekend and will fu pending tests.   Rest of the management as per the primary team. Please call with questions or concerns.  Thank you for the consult  Rosiland Oz, MD Infectious Disease Physician [facility] [address] [place], [place] [address] Phone: [phone]  Fax: [phone]  __________________________________________________________________________________________________________ HPI and Hospital Course: 76 year old male with PMH of arthritis, depression, hyperlipidemia, , hypertension, hypothyroidism, MVP, paroxysmal A-fib, multiple myeloma on CyBorD chemotherapy who presented to the ED on 7/2 with fevers and fatigue.  Patient was seen in the ED 1 day ago 7/1 for generalized weakness, fatigue and fever/chills.  Did not have any other symptoms like cough, congestion, nausea, vomiting,  diarrhea, abdominal pain or GU symptoms.  Patient did have some diarrhea after his last chemotherapy last week which resolved with Imodium.  He was discharged from the ED with 1 dose of ceftriaxone and p.o. cefdinir pending blood cultures.  However he continued to have fever at home and feeling tired.  Also with mild stinging when he urinates but no dysuria or increased frequency.   At ED, Tmax 102.9 Labs remarkable for AKI on CKD with creatinine 2.01.  Lactic acid 0.9.  WBC 4.3.  Platelets 99 7/2 UA on 7/3 UA unremarkable for UTI, urine cultures 7/2 with insignificant growth 7/1, 7/2 7/6 blood cultures with no growth  Denies any recent sick contact, recent travel.   ROS: all systems reviewed with pertinent positives and  negatives as listed above  Past Medical History:  Diagnosis Date   Allergy    Anxiety    Arthritis    Depression    Heart murmur    Hyperlipidemia    Hyperplastic colon polyp    Hyperproteinemia    Hypertension    Hypothyroidism    Mitral valve prolapse    Paroxysmal atrial fibrillation (HCC)    Thyroid disease    History reviewed. No pertinent surgical history.   Scheduled Meds:  apixaban  5 mg Oral BID   cyanocobalamin  1,000 mcg Intramuscular Daily   ferrous gluconate  324 mg Oral TID WC   guaiFENesin  1,200 mg Oral BID   levothyroxine  100 mcg  Oral QAC breakfast   metoprolol succinate  25 mg Oral Daily   senna-docusate  2 tablet Oral BID   Continuous Infusions:  amiodarone 30 mg/hr (03/14/22 0003)   ceFEPime (MAXIPIME) IV 2 g (03/14/22 0820)   PRN Meds:.acetaminophen, HYDROmorphone (DILAUDID) injection, melatonin, ondansetron (ZOFRAN) IV, mouth rinse, oxyCODONE, polyethylene glycol  Allergies  Allergen Reactions   Zithromax [Azithromycin] Other (See Comments)   Social History   Socioeconomic History   Marital status: Married    Spouse name: Not on file   Number of children: Not on file   Years of education: Not on file   Highest education level: Not on file  Occupational History   Not on file  Tobacco Use   Smoking status: Former    Years: 20.00    Types: Cigarettes    Quit date: 10/01/1978    Years since quitting: 43.4   Smokeless tobacco: Never  Substance and Sexual Activity   Alcohol use: Yes    Alcohol/week: 2.0 standard drinks of alcohol    Types: 2 Glasses of wine per week    Comment: occasionally   Drug use: No   Sexual activity: Not on file  Other Topics Concern   Not on file  Social History Narrative   Not on file   Social Determinants of Health   Financial Resource Strain: Not on file  Food Insecurity: Not on file  Transportation Needs: Not on file  Physical Activity: Not on file  Stress: Not on file  Social Connections: Not on file  Intimate Partner Violence: Not on file   Family History  Problem Relation Age of Onset   Cancer Father    Colon cancer Neg Hx    Esophageal cancer Neg Hx    Liver cancer Neg Hx    Pancreatic cancer Neg Hx    Rectal cancer Neg Hx    Stomach cancer Neg Hx     Vitals BP 105/73   Pulse 95   Temp 98.3 F (36.8 C) (Oral)   Resp 20   Ht $R'6\' 5"'aL$  (1.956 m)   Wt 98.7 kg   SpO2 98%   BMI 25.80 kg/m    Physical Exam Constitutional:  lying in the bed and appears tired     Comments:   Cardiovascular:     Rate and Rhythm: Normal rate and Irregular  rhythm.     Heart sounds:   Pulmonary:     Effort: Pulmonary effort is normal.     Comments: Normal breath sounds   Abdominal:     Palpations: Abdomen is soft.     Tenderness: non distended and non tender   Musculoskeletal:        General: No swelling or tenderness.   Skin:    Comments: No obvious  rashes   Neurological:     General: Grossly non focal, awake, alert and oriented   Psychiatric:        Mood and Affect: Mood normal.    Pertinent Microbiology Results for orders placed or performed during the hospital encounter of 03/09/22  Culture, blood (Routine x 2)     Status: None (Preliminary result)   Collection Time: 03/09/22  8:37 PM   Specimen: BLOOD  Result Value Ref Range Status   Specimen Description   Final    BLOOD LEFT ANTECUBITAL Performed at [facility], [address], [place], [place] [address]    Special Requests   Final    BOTTLES DRAWN AEROBIC AND ANAEROBIC Blood Culture results may not be optimal due to an excessive volume of blood received in culture bottles Performed at [facility], [address] [place], [place] [address]    Culture   Final    NO GROWTH 3 DAYS Performed at [facility], [address] [place], [place] [address]    Report Status PENDING  Incomplete  Culture, blood (Routine x 2)     Status: None (Preliminary result)   Collection Time: 03/09/22  8:40 PM   Specimen: BLOOD  Result Value Ref Range Status   Specimen Description   Final    BLOOD BLOOD LEFT HAND Performed at [facility], [address] [place], [place] [address]    Special Requests   Final    BOTTLES DRAWN AEROBIC AND ANAEROBIC Blood Culture results may not be optimal due to an inadequate volume of blood received in culture bottles Performed at [facility], [address], [place], [place] [address]    Culture   Final    NO GROWTH 3 DAYS Performed at [facility],  [address], [place], [place] [address]    Report Status PENDING  Incomplete  MRSA Next Gen by PCR, Nasal     Status: None   Collection Time: 03/10/22 12:17 PM   Specimen: Nasal Mucosa; Nasal Swab  Result Value Ref Range Status   MRSA by PCR Next Gen NOT DETECTED NOT DETECTED Final    Comment: (NOTE) The GeneXpert MRSA Assay (FDA approved for NASAL specimens only), is one component of a comprehensive MRSA colonization surveillance program. It is not intended to diagnose MRSA infection nor to guide or monitor treatment for MRSA infections. Test performance is not FDA approved in patients less than 38 years old. Performed at [facility], [address] [place], [place] [address]   Respiratory (~20 pathogens) panel by PCR     Status: None   Collection Time: 03/11/22  1:21 PM   Specimen: Nasopharyngeal Swab; Respiratory  Result Value Ref Range Status   Adenovirus NOT DETECTED NOT DETECTED Final   Coronavirus 229E NOT DETECTED NOT DETECTED Final    Comment: (NOTE) The Coronavirus on the Respiratory Panel, DOES NOT test for the novel  Coronavirus (2019 nCoV)    Coronavirus HKU1 NOT DETECTED NOT DETECTED Final   Coronavirus NL63 NOT DETECTED NOT DETECTED Final   Coronavirus OC43 NOT DETECTED NOT DETECTED Final   Metapneumovirus NOT DETECTED NOT DETECTED Final   Rhinovirus / Enterovirus NOT DETECTED NOT DETECTED Final   Influenza A NOT DETECTED NOT DETECTED Final   Influenza B NOT DETECTED NOT DETECTED Final   Parainfluenza Virus 1 NOT DETECTED NOT DETECTED Final   Parainfluenza Virus 2 NOT DETECTED NOT DETECTED Final  Parainfluenza Virus 3 NOT DETECTED NOT DETECTED Final   Parainfluenza Virus 4 NOT DETECTED NOT DETECTED Final   Respiratory Syncytial Virus NOT DETECTED NOT DETECTED Final   Bordetella pertussis NOT DETECTED NOT DETECTED Final   Bordetella Parapertussis NOT DETECTED NOT DETECTED Final   Chlamydophila pneumoniae NOT DETECTED NOT DETECTED Final    Mycoplasma pneumoniae NOT DETECTED NOT DETECTED Final    Comment: Performed at [facility], [place] [address] [place], [place] [address]   Pertinent Lab seen by me:    Latest Ref Rng & Units 03/14/2022    4:52 AM 03/13/2022    4:17 AM 03/12/2022    6:42 AM  CBC  WBC 4.0 - 10.5 K/uL 6.0  4.8  3.7   Hemoglobin 13.0 - 17.0 g/dL 8.1  7.8  7.7   Hematocrit 39.0 - 52.0 % 24.8  24.2  23.7   Platelets 150 - 400 K/uL 233  204  141       Latest Ref Rng & Units 03/14/2022    8:48 AM 03/13/2022    4:17 AM 03/12/2022    6:42 AM  CMP  Glucose 70 - 99 mg/dL 97  105  103   BUN 8 - 23 mg/dL $Remove'18  20  19   'lNGHJzh$ Creatinine 0.61 - 1.24 mg/dL 2.17  2.27  2.04   Sodium 135 - 145 mmol/L 133  136  135   Potassium 3.5 - 5.1 mmol/L 4.3  4.5  4.2   Chloride 98 - 111 mmol/L 107  110  110   CO2 22 - 32 mmol/L $RemoveB'16  18  19   'OZkeAnYG$ Calcium 8.9 - 10.3 mg/dL 8.0  8.0  7.7   Total Protein 6.5 - 8.1 g/dL 6.4  6.2  5.8   Total Bilirubin 0.3 - 1.2 mg/dL 0.7  0.6  0.7   Alkaline Phos 38 - 126 U/L 71  71  69   AST 15 - 41 U/L 34  29  31   ALT 0 - 44 U/L $Remo'31  25  25      'aUVeQ$ Pertinent Imagings/Other Imagings Plain films and CT images have been personally visualized and interpreted; radiology reports have been reviewed. Decision making incorporated into the Impression / Recommendations.  ECHOCARDIOGRAM COMPLETE  Result Date: 03/12/2022    ECHOCARDIOGRAM REPORT   Patient Name:   Mance Vallejo. Date of Exam: 03/12/2022 Medical Rec #:  [id number]           Height:       77.0 in Accession #:    [id number]          Weight:       217.6 lb Date of Birth:  1946-08-17          BSA:          2.318 m Patient Age:    76 years            BP:           108/84 mmHg Patient Gender: M                   HR:           128 bpm. Exam Location:  Inpatient Procedure: 2D Echo, Cardiac Doppler, Color Doppler and Intracardiac            Opacification Agent Indications:    Atrial Flutter I48.92  History:        Patient has no prior history of Echocardiogram  examinations.                 Risk Factors:Hypertension and Dyslipidemia. Hypothyroidism.                 Fever due to pneumonia. Chronic kidney disease, anemia.  Sonographer:    Leta Jungling RDCS Referring Phys: [id number] HEATHER E [place] IMPRESSIONS  1. Left ventricular ejection fraction, by estimation, is 40 to 45%. The left ventricle has mildly decreased function. The left ventricle has no regional wall motion abnormalities. There is mild concentric left ventricular hypertrophy. Left ventricular diastolic parameters are consistent with Grade I diastolic dysfunction (impaired relaxation).  2. Right ventricular systolic function is normal. The right ventricular size is normal. There is mildly elevated pulmonary artery systolic pressure.  3. The mitral valve is normal in structure. Mild mitral valve regurgitation. No evidence of mitral stenosis.  4. The aortic valve is tricuspid. Aortic valve regurgitation is trivial. Aortic valve sclerosis is present, with no evidence of aortic valve stenosis.  5. The inferior vena cava is normal in size with greater than 50% respiratory variability, suggesting right atrial pressure of 3 mmHg. FINDINGS  Left Ventricle: Left ventricular ejection fraction, by estimation, is 40 to 45%. The left ventricle has mildly decreased function. The left ventricle has no regional wall motion abnormalities. Definity contrast agent was given IV to delineate the left ventricular endocardial borders. The left ventricular internal cavity size was normal in size. There is mild concentric left ventricular hypertrophy. Left ventricular diastolic parameters are consistent with Grade I diastolic dysfunction (impaired relaxation). Right Ventricle: The right ventricular size is normal. No increase in right ventricular wall thickness. Right ventricular systolic function is normal. There is mildly elevated pulmonary artery systolic pressure. The tricuspid regurgitant velocity is 2.66  m/s, and with an  assumed right atrial pressure of 8 mmHg, the estimated right ventricular systolic pressure is 36.3 mmHg. Left Atrium: Left atrial size was normal in size. Right Atrium: Right atrial size was normal in size. Pericardium: There is no evidence of pericardial effusion. Mitral Valve: The mitral valve is normal in structure. Mild mitral valve regurgitation. No evidence of mitral valve stenosis. Tricuspid Valve: The tricuspid valve is normal in structure. Tricuspid valve regurgitation is not demonstrated. No evidence of tricuspid stenosis. Aortic Valve: The aortic valve is tricuspid. Aortic valve regurgitation is trivial. Aortic valve sclerosis is present, with no evidence of aortic valve stenosis. Pulmonic Valve: The pulmonic valve was normal in structure. Pulmonic valve regurgitation is not visualized. No evidence of pulmonic stenosis. Aorta: The aortic root is normal in size and structure. Venous: The inferior vena cava is normal in size with greater than 50% respiratory variability, suggesting right atrial pressure of 3 mmHg. IAS/Shunts: No atrial level shunt detected by color flow Doppler.  LEFT VENTRICLE PLAX 2D LVIDd:         4.60 cm      Diastology LVIDs:         3.30 cm      LV e' medial:    7.94 cm/s LV PW:         1.20 cm      LV E/e' medial:  13.1 LV IVS:        1.30 cm      LV e' lateral:   12.85 cm/s LVOT diam:     2.10 cm      LV E/e' lateral: 8.1 LV SV:         38 LV SV Index:  16 LVOT Area:     3.46 cm  LV Volumes (MOD) LV vol d, MOD A2C: 114.0 ml LV vol d, MOD A4C: 117.0 ml LV vol s, MOD A2C: 61.9 ml LV vol s, MOD A4C: 54.5 ml LV SV MOD A2C:     52.1 ml LV SV MOD A4C:     117.0 ml LV SV MOD BP:      54.9 ml RIGHT VENTRICLE RV S prime:     14.80 cm/s LEFT ATRIUM             Index        RIGHT ATRIUM           Index LA diam:        4.10 cm 1.77 cm/m   RA Area:     13.20 cm LA Vol (A2C):   63.1 ml 27.22 ml/m  RA Volume:   31.60 ml  13.63 ml/m LA Vol (A4C):   70.4 ml 30.37 ml/m LA Biplane Vol: 72.2 ml  31.14 ml/m  AORTIC VALVE LVOT Vmax:   80.00 cm/s LVOT Vmean:  53.600 cm/s LVOT VTI:    0.109 m  AORTA Ao Root diam: 3.50 cm Ao Asc diam:  3.00 cm MITRAL VALVE                TRICUSPID VALVE MV Area (PHT): 4.19 cm     TR Peak grad:   28.3 mmHg MV Decel Time: 181 msec     TR Vmax:        266.00 cm/s MV E velocity: 103.75 cm/s                             SHUNTS                             Systemic VTI:  0.11 m                             Systemic Diam: 2.10 cm Kardie Tobb DO Electronically signed by Berniece Salines DO Signature Date/Time: 03/12/2022/2:19:52 PM    Final    DG CHEST PORT 1 VIEW  Result Date: 03/12/2022 CLINICAL DATA:  Shortness of breath. EXAM: PORTABLE CHEST 1 VIEW COMPARISON:  03/11/2022. FINDINGS: Similar mild interstitial and patchy airspace opacities in the left lung. Similar streaky opacities at the right lung base. No visible pleural effusions or pneumothorax. No acute osseous abnormality. Polyarticular degenerative change. IMPRESSION: 1. Similar mild interstitial and patchy airspace opacities in the left lung which could represent asymmetric edema or infection. 2. Similar mild streaky right basilar opacities, which could represent atelectasis and/or pneumonia. Electronically Signed   By: Margaretha Sheffield M.D.   On: 03/12/2022 08:08   VAS [place] LOWER EXTREMITY VENOUS (DVT)  Result Date: 03/12/2022  Lower Venous DVT Study Patient Name:  Brandonn Capelli.  Date of Exam:   03/11/2022 Medical Rec #: [id number]            Accession #:    [id number] Date of Birth: December 20, 1945           Patient Gender: M Patient Age:   84 years Exam Location:  [facility] Procedure:      VAS [place] LOWER EXTREMITY VENOUS (DVT) Referring Phys: Raiford Noble --------------------------------------------------------------------------------  Indications: Swelling.  Comparison Study: No prior studies. Performing Technologist: Darlin Coco RDMS,  RVT  Examination Guidelines: A complete evaluation includes B-mode imaging,  spectral Doppler, color Doppler, and power Doppler as needed of all accessible portions of each vessel. Bilateral testing is considered an integral part of a complete examination. Limited examinations for reoccurring indications may be performed as noted. The reflux portion of the exam is performed with the patient in reverse Trendelenburg.  +---------+---------------+---------+-----------+----------+--------------+ RIGHT    CompressibilityPhasicitySpontaneityPropertiesThrombus Aging +---------+---------------+---------+-----------+----------+--------------+ CFV      Full           Yes      Yes                                 +---------+---------------+---------+-----------+----------+--------------+ SFJ      Full                                                        +---------+---------------+---------+-----------+----------+--------------+ FV Prox  Full                                                        +---------+---------------+---------+-----------+----------+--------------+ FV Mid   Full                                                        +---------+---------------+---------+-----------+----------+--------------+ FV DistalFull                                                        +---------+---------------+---------+-----------+----------+--------------+ PFV      Full                                                        +---------+---------------+---------+-----------+----------+--------------+ POP      Full           Yes      Yes                                 +---------+---------------+---------+-----------+----------+--------------+ PTV      Full                                                        +---------+---------------+---------+-----------+----------+--------------+ PERO     Full                                                        +---------+---------------+---------+-----------+----------+--------------+    +---------+---------------+---------+-----------+----------+--------------+  LEFT     CompressibilityPhasicitySpontaneityPropertiesThrombus Aging +---------+---------------+---------+-----------+----------+--------------+ CFV      Full           Yes      Yes                                 +---------+---------------+---------+-----------+----------+--------------+ SFJ      Full                                                        +---------+---------------+---------+-----------+----------+--------------+ FV Prox  Full                                                        +---------+---------------+---------+-----------+----------+--------------+ FV Mid   Full                                                        +---------+---------------+---------+-----------+----------+--------------+ FV DistalFull                                                        +---------+---------------+---------+-----------+----------+--------------+ PFV      Full                                                        +---------+---------------+---------+-----------+----------+--------------+ POP      Full           Yes      Yes                                 +---------+---------------+---------+-----------+----------+--------------+ PTV      Full                                                        +---------+---------------+---------+-----------+----------+--------------+ PERO     Full                                                        +---------+---------------+---------+-----------+----------+--------------+     Summary: RIGHT: - There is no evidence of deep vein thrombosis in the lower extremity.  - No cystic structure found in the popliteal fossa.  LEFT: - There is no evidence of deep vein thrombosis in the lower extremity.  - No cystic  structure found in the popliteal fossa.  *See table(s) above for measurements and observations. Electronically signed  by Jamelle Haring on 03/12/2022 at 2:07:01 AM.    Final    DG CHEST PORT 1 VIEW  Result Date: 03/11/2022 CLINICAL DATA:  Shortness of breath EXAM: PORTABLE CHEST 1 VIEW COMPARISON:  03/08/2022 FINDINGS: Heart size within normal limits. Interval development of interstitial opacities within the left mid to lower lung. Elevation of the right hemidiaphragm with streaky right basilar opacity. No pleural effusion or pneumothorax. IMPRESSION: 1. Interval development of interstitial opacities within the left mid to lower lung concerning for atypical/viral infection. 2. Streaky right basilar opacity may represent atelectasis versus developing infection. Electronically Signed   By: Davina Poke D.O.   On: 03/11/2022 10:37   DG Chest Port 1 View  Result Date: 03/08/2022 CLINICAL DATA:  Questionable sepsis - evaluate for abnormality Fever.  Chemotherapy, most recently Monday. EXAM: PORTABLE CHEST 1 VIEW COMPARISON:  Chest radiograph 03/12/2014 FINDINGS: The heart is normal in size. Streaky opacity at the right lung base is unchanged from prior exam and most consistent with scarring. No evidence of acute airspace disease. No pulmonary edema, pleural effusion, or pneumothorax. No acute osseous abnormalities are seen on this portable exam, patient with history of myeloma. IMPRESSION: No acute chest findings. Unchanged scarring at the right lung base. Electronically Signed   By: Keith Rake M.D.   On: 03/08/2022 23:45     I spent approx 80 minutes for this patient encounter including review of prior medical records/discussing diagnostics and treatment plan with the patient/family/coordinate care with primary/other specialits with greater than 50% of time in face to face encounter.   Electronically signed by:   Rosiland Oz, MD Infectious Disease Physician [facility] Pager: [phone]

## 2022-03-14 NOTE — Transfer of Care (Signed)
Immediate Anesthesia Transfer of Care Note  Patient: Darren Hinton.  Procedure(s) Performed: TRANSESOPHAGEAL ECHOCARDIOGRAM (TEE) CARDIOVERSION  Patient Location: PACU and Endoscopy Unit  Anesthesia Type:MAC  Level of Consciousness: awake, alert  and patient cooperative  Airway & Oxygen Therapy: Patient Spontanous Breathing  Post-op Assessment: Report given to RN and Post -op Vital signs reviewed and stable  Post vital signs: Reviewed and stable  Last Vitals:  Vitals Value Taken Time  BP 117/76 03/14/22 1051  Temp    Pulse 91 03/14/22 1053  Resp 21 03/14/22 1053  SpO2 95 % 03/14/22 1053  Vitals shown include unvalidated device data.  Last Pain:  Vitals:   03/14/22 1000  TempSrc: Temporal  PainSc: 0-No pain         Complications: No notable events documented.

## 2022-03-14 NOTE — Anesthesia Postprocedure Evaluation (Signed)
Anesthesia Post Note  Patient: Darren Hinton.  Procedure(s) Performed: TRANSESOPHAGEAL ECHOCARDIOGRAM (TEE) CARDIOVERSION     Patient location during evaluation: Endoscopy Anesthesia Type: MAC Level of consciousness: awake and alert Pain management: pain level controlled Vital Signs Assessment: post-procedure vital signs reviewed and stable Respiratory status: spontaneous breathing, nonlabored ventilation, respiratory function stable and patient connected to nasal cannula oxygen Cardiovascular status: blood pressure returned to baseline and stable Postop Assessment: no apparent nausea or vomiting Anesthetic complications: no   No notable events documented.  Last Vitals:  Vitals:   03/14/22 1341 03/14/22 1400  BP: 124/77 107/84  Pulse: 84 93  Resp: 19 16  Temp: 36.5 C   SpO2: 96% 96%    Last Pain:  Vitals:   03/14/22 1400  TempSrc:   PainSc: 0-No pain                 Barnet Glasgow

## 2022-03-14 NOTE — Progress Notes (Signed)
OT Cancellation Note  Patient Details Name: Darren Hinton. MRN: [id number] DOB: 10-Dec-1945   Cancelled Treatment:    Reason Eval/Treat Not Completed: Patient at procedure or test/ unavailable- pt off unit at TEE. Will follow and see as able.   Jolaine Artist, OT Acute Rehabilitation Services Office [phone]   Delight Stare 03/14/2022, 9:58 AM

## 2022-03-14 NOTE — CV Procedure (Addendum)
TEE/CARDIOVERSION NOTE  TRANSESOPHAGEAL ECHOCARDIOGRAM (TEE):  Indictation: Atrial Fibrillation  Consent:   Informed consent was obtained prior to the procedure. The risks, benefits and alternatives for the procedure were discussed and the patient comprehended these risks.  Risks include, but are not limited to, cough, sore throat, vomiting, nausea, somnolence, esophageal and stomach trauma or perforation, bleeding, low blood pressure, aspiration, pneumonia, infection, trauma to the teeth and death.    Time Out: Verified patient identification, verified procedure, site/side was marked, verified correct patient position, special equipment/implants available, medications/allergies/relevent history reviewed, required imaging and test results available. Performed  Procedure:  After a procedural time-out, the patient was given propofol per anesthesia for sedation. See their separate report for details. The patient's heart rate, blood pressure, and oxygen saturation are monitored continuously during the procedure. The oropharynx was anesthetized with topical cetacaine.  The transesophageal probe was inserted in the esophagus and stomach without difficulty and multiple views were obtained. Agitated microbubble saline contrast was administered.  Complications:    Complications: None Patient did tolerate procedure well.  Findings:  LEFT VENTRICLE: The left ventricular wall thickness is mildly increased.  The left ventricular cavity is normal in size. Wall motion is normal.  LVEF is 50-55%.  RIGHT VENTRICLE:  The right ventricle is normal in structure and function without any thrombus or masses.    LEFT ATRIUM:  The left atrium is normal in size without any thrombus or masses.  There is not spontaneous echo contrast ("smoke") in the left atrium consistent with a low flow state.  LEFT ATRIAL APPENDAGE:  The small left atrial appendage is free of any thrombus or masses. The appendage has single  lobes. Pulse doppler indicates moderate flow in the appendage.  ATRIAL SEPTUM:  The atrial septum appears intact and is free of thrombus and/or masses.  There is no evidence for interatrial shunting by color doppler.  RIGHT ATRIUM:  The right atrium is normal in size and function without any thrombus or masses.  MITRAL VALVE:  The mitral valve is normal in structure and function with  trivial  regurgitation.  There were no vegetations or stenosis.  AORTIC VALVE:  The aortic valve is trileaflet, normal in structure and function with  no  regurgitation.  There were no vegetations or stenosis  TRICUSPID VALVE:  The tricuspid valve is normal in structure and function with  trivial  regurgitation.  There were no vegetations or stenosis   PULMONIC VALVE:  The pulmonic valve is normal in structure and function with  trivial  regurgitation.  There were no vegetations or stenosis.   AORTIC ARCH, ASCENDING AND DESCENDING AORTA:  There was grade 1 Ron Parker et. Al, 1992) atherosclerosis of the proximal descending aorta.  12. PULMONARY VEINS: Anomalous pulmonary venous return was not noted.  13. PERICARDIUM: The pericardium appeared normal and non-thickened.  There is no pericardial effusion.  CARDIOVERSION:     Second Time Out: Verified patient identification, verified procedure, site/side was marked, verified correct patient position, special equipment/implants available, medications/allergies/relevent history reviewed, required imaging and test results available.  Performed  Procedure:  Patient placed on cardiac monitor, pulse oximetry, supplemental oxygen as necessary.  Sedation administered per anesthesia Pacer pads placed anterior and posterior chest. Cardioverted 4 time(s).  Cardioverted at 120J, 150J and 200J x 2 biphasic. Converted to sinus after the initial shock, then back in afib- converted to sinus again for a few seconds with 200J, then back in flutter.  Complications:  Complications:  None Patient did  tolerate procedure well.  Impression:  No LAA thrombus No endocarditis Negative for PFO by color doppler Normal biatrial size LVEF 50-55% Ultimately unsuccessful cardioversion after initially converting to sinus rhythm and degenerating back into atrial fibrillation with multiple attempts.  Recommendations:  Continue rate-control with amiodarone. Consider atrial fib ablation or repeat cardioversion at some point in the future.  Time Spent Directly with the Patient:  45 minutes   Pixie Casino, MD, [facility], [place] Director of the Advanced Lipid Disorders &  Cardiovascular Risk Reduction Clinic Diplomate of the American Board of Clinical Lipidology Attending Cardiologist  Direct Dial: [phone]  Fax: [phone]  Website:  [email or web address] Kaoir Loree 03/14/2022, 10:41 AM

## 2022-03-15 DIAGNOSIS — R509 Fever, unspecified: Secondary | ICD-10-CM | POA: Diagnosis not present

## 2022-03-15 LAB — BASIC METABOLIC PANEL
Anion gap: 11 (ref 5–15)
BUN: 17 mg/dL (ref 8–23)
CO2: 15 mmol/L — ABNORMAL LOW (ref 22–32)
Calcium: 8.3 mg/dL — ABNORMAL LOW (ref 8.9–10.3)
Chloride: 113 mmol/L — ABNORMAL HIGH (ref 98–111)
Creatinine, Ser: 2.05 mg/dL — ABNORMAL HIGH (ref 0.61–1.24)
GFR, Estimated: 33 mL/min — ABNORMAL LOW (ref >60)
Glucose, Bld: 85 mg/dL (ref 70–99)
Potassium: 4.5 mmol/L (ref 3.5–5.1)
Sodium: 139 mmol/L (ref 135–145)

## 2022-03-15 LAB — CBC
HCT: 26.1 % — ABNORMAL LOW (ref 39.0–52.0)
Hemoglobin: 8.3 g/dL — ABNORMAL LOW (ref 13.0–17.0)
MCH: 34.2 pg — ABNORMAL HIGH (ref 26.0–34.0)
MCHC: 31.8 g/dL (ref 30.0–36.0)
MCV: 107.4 fL — ABNORMAL HIGH (ref 80.0–100.0)
Platelets: 359 10*3/uL (ref 150–400)
RBC: 2.43 MIL/uL — ABNORMAL LOW (ref 4.22–5.81)
RDW: 15.8 % — ABNORMAL HIGH (ref 11.5–15.5)
WBC: 7.6 10*3/uL (ref 4.0–10.5)
nRBC: 0 % (ref 0.0–0.2)

## 2022-03-15 LAB — CULTURE, BLOOD (ROUTINE X 2)
Culture: NO GROWTH
Culture: NO GROWTH

## 2022-03-15 NOTE — Progress Notes (Signed)
Id brief note   Fever had resolved Ct noncontrast with 3 liver cyst vs abscess  On ctrx/azith  Tee no vegetation (done for afib/aflutter rvr cardioversion)  Normal lft  A/p Fever Mm on chemo Afib/flutter Liver cyst vs abscess   The duration of his sx is still rather short  -I would hold course of tx and give him 5 day of abx and follow up outpatient -unless he continues to be febrile here could repeat imaging in a few weeks to see what the liver lesions are doing -if ongoing fever would consider liver cysts aspiration and to assess for abscess

## 2022-03-15 NOTE — Evaluation (Signed)
Occupational Therapy Evaluation Patient Details Name: Cleofas Hudgins. MRN: [id number] DOB: 1945-11-01 Today's Date: 03/15/2022   History of Present Illness 76 y.o. male with medical history significant for multiple myeloma on recent chemotherapy (03/03/22), paroxysmal A-fib not oral anticoagulated, essential hypertension, hyperlipidemia, hypothyroidism, mitral valve prolapse, hypothyroidism, chronic anxiety/depression who presented to [facility] ED from home with complaints of having recurrent subjective fevers with Tmax 102 at home. Dx: fever of unclear etiology, atrial fibrillation, s/p TEE cardioversion 03/14/22.   Clinical Impression   Patient is a 76 year old male who was admitted for above. Patient's HR was noted to range from 100 to 133 bpm during activity. Patient was noted to have decreased functional activity tolerance, decreased standing balance, and decreased safety awareness impacting participation in ADLs. Patient would continue to benefit from skilled OT services at this time while admitted and after d/c to address noted deficits in order to improve overall safety and independence in ADLs.         Recommendations for follow up therapy are one component of a multi-disciplinary discharge planning process, led by the attending physician.  Recommendations may be updated based on patient status, additional functional criteria and insurance authorization.   Follow Up Recommendations  Home health OT    Assistance Recommended at Discharge Frequent or constant Supervision/Assistance  Patient can return home with the following A little help with walking and/or transfers;A little help with bathing/dressing/bathroom;Assistance with cooking/housework;Direct supervision/assist for financial management;Direct supervision/assist for medications management;Assist for transportation    Functional Status Assessment  Patient has had a recent decline in their functional status and demonstrates the  ability to make significant improvements in function in a reasonable and predictable amount of time.  Equipment Recommendations  None recommended by OT    Recommendations for Other Services       Precautions / Restrictions Precautions Precautions: Other (comment) Precaution Comments: monitor HR Restrictions Weight Bearing Restrictions: No      Mobility Bed Mobility Overal bed mobility: Modified Independent             General bed mobility comments: HOB up, used rail    Transfers                          Balance Overall balance assessment: No apparent balance deficits (not formally assessed)                                         ADL either performed or assessed with clinical judgement   ADL Overall ADL's : Needs assistance/impaired Eating/Feeding: Set up;Sitting   Grooming: Sitting;Set up;Wash/dry face;Wash/dry hands Grooming Details (indicate cue type and reason): EOB Upper Body Bathing: Set up;Sitting   Lower Body Bathing: Sit to/from stand;Sitting/lateral leans;Min guard Lower Body Bathing Details (indicate cue type and reason): during standing with HR noted to increase to 133bpm wtih standing but did not sustain at that level. Upper Body Dressing : Set up;Sitting Upper Body Dressing Details (indicate cue type and reason): EOB Lower Body Dressing: Min guard;Sit to/from stand Lower Body Dressing Details (indicate cue type and reason): with standing to pull up pants with RW Toilet Transfer: Min guard Toilet Transfer Details (indicate cue type and reason): patient was able to transfer from edge of bed to recliner in room with min guardf with RW and cues for  Vision Baseline Vision/History: 1 Wears glasses Patient Visual Report: No change from baseline       Perception     Praxis      Pertinent Vitals/Pain Pain Assessment Pain Assessment: No/denies pain     Hand Dominance     Extremity/Trunk  Assessment Upper Extremity Assessment Upper Extremity Assessment: Overall WFL for tasks assessed   Lower Extremity Assessment Lower Extremity Assessment: Defer to PT evaluation   Cervical / Trunk Assessment Cervical / Trunk Assessment: Normal   Communication Communication Communication: No difficulties   Cognition Arousal/Alertness: Awake/alert Behavior During Therapy: WFL for tasks assessed/performed Overall Cognitive Status: Within Functional Limits for tasks assessed                                       General Comments       Exercises     Shoulder Instructions      Home Living Family/patient expects to be discharged to:: Private residence Living Arrangements: Spouse/significant other Available Help at Discharge: Family;Available 24 hours/day Type of Home: House Home Access: Stairs to enter [employer] of Steps: 2 Entrance Stairs-Rails: Left Home Layout: One level     Bathroom Shower/Tub: Occupational psychologist: Handicapped height     Home Equipment: Rollator (4 wheels);Cane - single point;Shower seat;Grab bars - tub/shower          Prior Functioning/Environment Prior Level of Function : Independent/Modified Independent;Driving             Mobility Comments: used cane in home, rollator prn esp bc he can't stand long due to "bulging discs in the back", no falls in past 6 months but some stumbling recently due to weakness from this illness ADLs Comments: independent        OT Problem List: Impaired balance (sitting and/or standing);Decreased activity tolerance;Decreased safety awareness;Cardiopulmonary status limiting activity;Decreased knowledge of use of DME or AE      OT Treatment/Interventions: Self-care/ADL training;Therapeutic exercise;Energy conservation;DME and/or AE instruction;Therapeutic activities;Balance training;Patient/family education    OT Goals(Current goals can be found in the care plan section)  Acute Rehab OT Goals Patient Stated Goal: to get out of bed OT Goal Formulation: With patient/family Time For Goal Achievement: 03/29/22 Potential to Achieve Goals: Fair  OT Frequency: Min 2X/week    Co-evaluation              AM-PAC OT "6 Clicks" Daily Activity     Outcome Measure Help from another person eating meals?: None Help from another person taking care of personal grooming?: A Little Help from another person toileting, which includes using toliet, bedpan, or urinal?: A Lot Help from another person bathing (including washing, rinsing, drying)?: A Lot Help from another person to put on and taking off regular upper body clothing?: A Little Help from another person to put on and taking off regular lower body clothing?: A Lot 6 Click Score: 16   End of Session Equipment Utilized During Treatment: Gait belt;Rolling walker (2 wheels) Nurse Communication: Other (comment) (ok to participate in session)  Activity Tolerance: Patient tolerated treatment well Patient left: in chair;with call bell/phone within reach;with family/visitor present  OT Visit Diagnosis: Unsteadiness on feet (R26.81);Muscle weakness (generalized) (M62.81)                Time: 4174-0814 OT Time Calculation (min): 40 min Charges:  OT General Charges $OT Visit: 1 Visit OT Evaluation $OT  Eval Moderate Complexity: 1 Mod OT Treatments $Self Care/Home Management : 23-37 mins  Jackelyn Poling OTR/L, MS Acute Rehabilitation Department Office# [phone] Pager# [phone]   Marcellina Millin 03/15/2022, 12:55 PM

## 2022-03-15 NOTE — Progress Notes (Signed)
PROGRESS NOTE  Darren Hinton. AJO:[id number] DOB: 08/22/1946 DOA: 03/09/2022 PCP: [name], MD   LOS: 6 days   Brief Narrative / Interim history: 76 year old male with multiple myeloma recently had chemotherapy 6/26, PAF not on anticoagulation, HTN, HLD, hypothyroidism, comes into the hospital with subjective fevers.  Imaging was concerning for developing pneumonia, he was placed on antibiotics and was admitted to the hospital.  He developed A-fib with RVR  Subjective / 24h Interval events: Finally he is afebrile now.  Assesement and Plan: Principal problem Fever due to presumed pneumonia-intermittently febrile till 7/7, now afebrile so far cultures are without growth.  Continue cefepime.  He does not really have significant respiratory symptoms so source may be different.  Monitor cultures.  COVID and RVP negative.  Procalcitonin was elevated.  Due to persistent fevers ID consulted, appreciate input.  He underwent a CT scan of the abdomen and pelvis yesterday which showed some liver cysts, very small, there was a question about abscesses.  Defer to ID, but clinically doubt liver abscesses  Active problems PAF, A-fib with RVR-patient placed on anticoagulation with heparin, amiodarone infusion.  Cardiology consulted, underwent TEE cardioversion 7/7 but this was unsuccessful.  Continue amiodarone p.o., Eliquis  Hypovolemic hyponatremia-sodium has now normalized  Hypokalemia, hypophosphatemia-monitor and replete as indicated.  Potassium is normal this morning  CKD 3B-appears to be close to baseline in the 2 range.  Creatinine has remained stable  Anemia of chronic disease, macrocytic anemia-B12 borderline on the lower side of normal, will replete.  He is also iron deficient, placed on iron.  He also has underlying malignancy  Multiple myeloma, recent chemotherapy-outpatient management  Thrombocytopenia-monitor, platelets remain normal  Hypothyroidism-continue Synthroid.   TSH unremarkable  Scheduled Meds:  [START ON 03/22/2022] amiodarone  200 mg Oral Daily   amiodarone  200 mg Oral BID   apixaban  5 mg Oral BID   cyanocobalamin  1,000 mcg Intramuscular Daily   ferrous gluconate  324 mg Oral TID WC   guaiFENesin  1,200 mg Oral BID   levothyroxine  100 mcg Oral QAC breakfast   metoprolol succinate  50 mg Oral Daily   senna-docusate  2 tablet Oral BID   Continuous Infusions:  azithromycin 500 mg (03/14/22 2154)   cefTRIAXone (ROCEPHIN)  IV 2 g (03/14/22 2328)   PRN Meds:.acetaminophen, HYDROmorphone (DILAUDID) injection, melatonin, ondansetron (ZOFRAN) IV, mouth rinse, oxyCODONE, polyethylene glycol  Diet Orders (From admission, onward)     Start     Ordered   03/14/22 1328  Diet regular Room service appropriate? Yes; Fluid consistency: Thin  Diet effective now       Question Answer Comment  Room service appropriate? Yes   Fluid consistency: Thin      03/14/22 1327            DVT prophylaxis: SCDs Start: 03/09/22 2311 apixaban (ELIQUIS) tablet 5 mg   Lab Results  Component Value Date   PLT 359 03/15/2022      Code Status: Full Code  Family Communication: wife at bedside  Status is: Inpatient  Remains inpatient appropriate because: Severity of illness   Level of care: Telemetry  Consultants:  Cardiology   Objective: Vitals:   03/15/22 0500 03/15/22 0543 03/15/22 0546 03/15/22 1158  BP: 111/71   116/70  Pulse: 89     Resp: (!) 27 17    Temp:   98.4 F (36.9 C) 98 F (36.7 C)  TempSrc:   Oral Oral  SpO2: 97%  Weight:      Height:        Intake/Output Summary (Last 24 hours) at 03/15/2022 1308 Last data filed at 03/15/2022 0300 Gross per 24 hour  Intake 350 ml  Output 400 ml  Net -50 ml    Wt Readings from Last 3 Encounters:  03/14/22 98 kg  03/08/22 98.9 kg  03/03/22 99.2 kg    Examination:  Constitutional: NAD Eyes: lids and conjunctivae normal, no scleral icterus ENMT: mmm Neck: normal,  supple Respiratory: clear to auscultation bilaterally, no wheezing, no crackles. Normal respiratory effort.  Cardiovascular: Regular rate and rhythm, no murmurs / rubs / gallops. No LE edema. Abdomen: soft, no distention, no tenderness. Bowel sounds positive.  Skin: no rashes Neurologic: no focal deficits, equal strength   Data Reviewed: I have independently reviewed following labs and imaging studies   CBC Recent Labs  Lab 03/08/22 2246 03/09/22 2037 03/10/22 0401 03/11/22 0356 03/12/22 0642 03/13/22 0417 03/14/22 0452 03/15/22 0513  WBC 3.6* 4.3 5.7 3.4* 3.7* 4.8 6.0 7.6  HGB 9.2* 8.6* 9.1* 8.4* 7.7* 7.8* 8.1* 8.3*  HCT 28.3* 25.5* 28.0* 26.1* 23.7* 24.2* 24.8* 26.1*  PLT 124* 99* 98* 112* 141* 204 233 359  MCV 106.4* 104.5* 108.1* 107.9* 105.8* 106.6* 106.0* 107.4*  MCH 34.6* 35.2* 35.1* 34.7* 34.4* 34.4* 34.6* 34.2*  MCHC 32.5 33.7 32.5 32.2 32.5 32.2 32.7 31.8  RDW 14.2 14.3 14.5 14.6 15.1 15.3 15.7* 15.8*  LYMPHSABS 0.2* 0.3* 0.3* 0.3* 0.5*  --   --   --   MONOABS 0.5 0.4 0.6 0.3 0.4  --   --   --   EOSABS 0.0 0.0 0.0 0.1 0.2  --   --   --   BASOSABS 0.0 0.0 0.0 0.0 0.0  --   --   --      Recent Labs  Lab 03/08/22 2246 03/08/22 2246 03/08/22 2314 03/09/22 2037 03/10/22 0401 03/11/22 0356 03/11/22 1425 03/12/22 0642 03/12/22 0644 03/13/22 0109 03/13/22 0417 03/14/22 0848 03/14/22 1250 03/15/22 0513  NA 139  --   --  134* 136 137  --  135  --   --  136 133*  --  139  K 3.6  --   --  3.4* 3.6 4.1  --  4.2  --   --  4.5 4.3  --  4.5  CL 113*  --   --  108 110 110  --  110  --   --  110 107  --  113*  CO2 18*  --   --  18* 18* 20*  --  19*  --   --  18* 16*  --  15*  GLUCOSE 140*  --   --  106* 100* 115*  --  103*  --   --  105* 97  --  85  BUN 20  --   --  '19 20 20  ' --  19  --   --  20 18  --  17  CREATININE 1.98*  --   --  2.01* 1.99* 2.12*  --  2.04*  --   --  2.27* 2.17*  --  2.05*  CALCIUM 8.7*  --   --  7.9* 7.9* 8.0*  --  7.7*  --   --  8.0* 8.0*  --   8.3*  AST 17  --   --  25 30 41  --  31  --   --  29 34  --   --  ALT 14  --   --  '15 18 26  ' --  25  --   --  25 31  --   --   ALKPHOS 95  --   --  76 73 70  --  69  --   --  71 71  --   --   BILITOT 1.1  --   --  1.0 1.0 0.7  --  0.7  --   --  0.6 0.7  --   --   ALBUMIN 3.8  --   --  3.6 3.4* 3.0*  --  2.9*  --   --  3.0* 3.0*  --   --   MG  --    < >  --  2.2 1.8 2.6*  --  2.4  --   --  2.2 2.3  --   --   PROCALCITON  --   --   --   --   --   --  1.80 1.48  --   --  1.48  --   --   --   LATICACIDVEN 1.2  --   --  0.9  --   --   --   --   --  1.2  --   --   --   --   INR  --   --  1.1 1.1  --   --   --   --   --   --   --   --  1.4*  --   TSH  --   --   --   --   --   --   --   --  3.629  --   --   --   --   --    < > = values in this interval not displayed.     ------------------------------------------------------------------------------------------------------------------ No results for input(s): "CHOL", "HDL", "LDLCALC", "TRIG", "CHOLHDL", "LDLDIRECT" in the last 72 hours.  No results found for: "HGBA1C" ------------------------------------------------------------------------------------------------------------------ No results for input(s): "TSH", "T4TOTAL", "T3FREE", "THYROIDAB" in the last 72 hours.  Invalid input(s): "FREET3"   Cardiac Enzymes No results for input(s): "CKMB", "TROPONINI", "MYOGLOBIN" in the last 168 hours.  Invalid input(s): "CK" ------------------------------------------------------------------------------------------------------------------ No results found for: "BNP"  CBG: No results for input(s): "GLUCAP" in the last 168 hours.  Recent Results (from the past 240 hour(s))  SARS Coronavirus 2 by RT PCR (hospital order, performed in [facility] hospital lab) *cepheid single result test* Anterior Nasal Swab     Status: None   Collection Time: 03/08/22 11:13 PM   Specimen: Anterior Nasal Swab  Result Value Ref Range Status   SARS Coronavirus 2 by RT  PCR NEGATIVE NEGATIVE Final    Comment: (NOTE) SARS-CoV-2 target nucleic acids are NOT DETECTED.  The SARS-CoV-2 RNA is generally detectable in upper and lower respiratory specimens during the acute phase of infection. The lowest concentration of SARS-CoV-2 viral copies this assay can detect is 250 copies / mL. A negative result does not preclude SARS-CoV-2 infection and should not be used as the sole basis for treatment or other patient management decisions.  A negative result may occur with improper specimen collection / handling, submission of specimen other than nasopharyngeal swab, presence of viral mutation(s) within the areas targeted by this assay, and inadequate number of viral copies (<250 copies / mL). A negative result must be combined with clinical observations, patient history, and epidemiological information.  Fact Sheet for Patients:  https://www.patel.info/  Fact Sheet for Healthcare Providers: https://hall.com/  This test is not yet approved or  cleared by the [place] FDA and has been authorized for detection and/or diagnosis of SARS-CoV-2 by FDA under an Emergency Use Authorization (EUA).  This EUA will remain in effect (meaning this test can be used) for the duration of the COVID-19 declaration under Section 564(b)(1) of the Act, 21 U.S.C. section 360bbb-3(b)(1), unless the authorization is terminated or revoked sooner.  Performed at [facility], [place] [address] [place], [place] [address]   Blood culture (routine x 2)     Status: None   Collection Time: 03/08/22 11:14 PM   Specimen: BLOOD  Result Value Ref Range Status   Specimen Description   Final    BLOOD Performed at [facility] [address] [place], [place] [address]    Special Requests   Final    BOTTLES DRAWN AEROBIC AND ANAEROBIC Blood Culture adequate volume Performed at [facility] [address] [place], [place] [address]    Culture   Final    NO GROWTH 5 DAYS Performed at [facility], [place] [address] [place], [place] [address]    Report Status 03/14/2022 FINAL  Final  Blood culture (routine x 2)     Status: None   Collection Time: 03/08/22 11:50 PM   Specimen: BLOOD  Result Value Ref Range Status   Specimen Description   Final    BLOOD LEFT ANTECUBITAL Performed at [facility] [address], [place], [place] [address]    Special Requests   Final    BOTTLES DRAWN AEROBIC AND ANAEROBIC Blood Culture results may not be optimal due to an excessive volume of blood received in culture bottles Performed at [facility] [address], [place], [place] [address]    Culture   Final    NO GROWTH 5 DAYS Performed at [facility], [place] [address] [place], [place] [address]    Report Status 03/14/2022 FINAL  Final  Urine Culture     Status: Abnormal   Collection Time: 03/09/22 12:04 AM   Specimen: Urine, Clean Catch  Result Value Ref Range Status   Specimen Description   Final    URINE, CLEAN CATCH Performed at [facility], [place] [address], [place], [place] [address]    Special Requests   Final    NONE Performed at [facility], [place] [address], [place], [place] [address]    Culture (A)  Final    <10,000 COLONIES/mL INSIGNIFICANT GROWTH Performed at [facility] [address], [place], [place] [address]    Report Status 03/10/2022 FINAL  Final  Culture, blood (Routine x 2)     Status: None   Collection Time: 03/09/22  8:37 PM   Specimen: BLOOD  Result Value Ref Range Status   Specimen Description   Final    BLOOD LEFT ANTECUBITAL Performed at [facility] [address], [place], [place] [address]    Special Requests   Final    BOTTLES DRAWN AEROBIC AND ANAEROBIC Blood Culture results may not be optimal due to an excessive volume of  blood received in culture bottles Performed at [place] [address] [place], [place] [address]    Culture   Final    NO GROWTH 5 DAYS Performed at [facility], [place] [address], [place], [place]  [address]    Report Status 03/15/2022 FINAL  Final  Culture, blood (Routine x 2)     Status: None   Collection Time: 03/09/22  8:40 PM   Specimen: BLOOD  Result Value Ref Range Status   Specimen Description   Final    BLOOD BLOOD LEFT HAND Performed at [facility] [address], [place], [place] [address]    Special Requests   Final    BOTTLES DRAWN AEROBIC AND ANAEROBIC Blood Culture results may not be optimal due to an inadequate volume of blood received in culture bottles Performed at [facility] [address] [place], [place] [address]    Culture   Final    NO GROWTH 5 DAYS Performed at [facility], [place] [address] [place], [place] [address]    Report Status 03/15/2022 FINAL  Final  MRSA Next Gen by PCR, Nasal     Status: None   Collection Time: 03/10/22 12:17 PM   Specimen: Nasal Mucosa; Nasal Swab  Result Value Ref Range Status   MRSA by PCR Next Gen NOT DETECTED NOT DETECTED Final    Comment: (NOTE) The GeneXpert MRSA Assay (FDA approved for NASAL specimens only), is one component of a comprehensive MRSA colonization surveillance program. It is not intended to diagnose MRSA infection nor to guide or monitor treatment for MRSA infections. Test performance is not FDA approved in patients less than 3 years old. Performed at [facility], [place] [address] [place], [place] [address]   Respiratory (~20 pathogens) panel by PCR     Status: None   Collection Time: 03/11/22  1:21 PM   Specimen: Nasopharyngeal Swab; Respiratory  Result Value Ref Range Status   Adenovirus NOT DETECTED NOT DETECTED Final   Coronavirus 229E NOT DETECTED NOT DETECTED Final    Comment: (NOTE) The  Coronavirus on the Respiratory Panel, DOES NOT test for the novel  Coronavirus (2019 nCoV)    Coronavirus HKU1 NOT DETECTED NOT DETECTED Final   Coronavirus NL63 NOT DETECTED NOT DETECTED Final   Coronavirus OC43 NOT DETECTED NOT DETECTED Final   Metapneumovirus NOT DETECTED NOT DETECTED Final   Rhinovirus / Enterovirus NOT DETECTED NOT DETECTED Final   Influenza A NOT DETECTED NOT DETECTED Final   Influenza B NOT DETECTED NOT DETECTED Final   Parainfluenza Virus 1 NOT DETECTED NOT DETECTED Final   Parainfluenza Virus 2 NOT DETECTED NOT DETECTED Final   Parainfluenza Virus 3 NOT DETECTED NOT DETECTED Final   Parainfluenza Virus 4 NOT DETECTED NOT DETECTED Final   Respiratory Syncytial Virus NOT DETECTED NOT DETECTED Final   Bordetella pertussis NOT DETECTED NOT DETECTED Final   Bordetella Parapertussis NOT DETECTED NOT DETECTED Final   Chlamydophila pneumoniae NOT DETECTED NOT DETECTED Final   Mycoplasma pneumoniae NOT DETECTED NOT DETECTED Final    Comment: Performed at [facility], [place]. [address], [place], [place] [address]  Culture, blood (Routine X 2) w Reflex to ID Panel     Status: None (Preliminary result)   Collection Time: 03/13/22  4:16 AM   Specimen: BLOOD  Result Value Ref Range Status   Specimen Description   Final    BLOOD LEFT ANTECUBITAL Performed at [facility] [address], [place], [place] [address]    Special Requests   Final    BOTTLES DRAWN AEROBIC AND ANAEROBIC Blood Culture adequate volume Performed at [facility] [address] [place], [place] [address]  Culture   Final    NO GROWTH 2 DAYS Performed at [facility], [place] [address], [place], [place] [address]    Report Status PENDING  Incomplete  Culture, blood (Routine X 2) w Reflex to ID Panel     Status: None (Preliminary result)   Collection Time: 03/13/22  4:17 AM   Specimen: BLOOD  Result Value Ref Range Status   Specimen Description    Final    BLOOD BLOOD RIGHT HAND Performed at [facility] [address] [place], [place] [address]    Special Requests   Final    BOTTLES DRAWN AEROBIC AND ANAEROBIC Blood Culture adequate volume Performed at [facility] [address], [place], [place] [address]    Culture   Final    NO GROWTH 2 DAYS Performed at [facility] [address] [place], [place] [address]    Report Status PENDING  Incomplete     Radiology Studies: CT CHEST ABDOMEN PELVIS WO CONTRAST  Result Date: 03/14/2022 CLINICAL DATA:  Sepsis r/o occult abscess History of multiple myeloma. EXAM: CT CHEST, ABDOMEN AND PELVIS WITHOUT CONTRAST TECHNIQUE: Multidetector CT imaging of the chest, abdomen and pelvis was performed following the standard protocol without IV contrast. RADIATION DOSE REDUCTION: This exam was performed according to the departmental dose-optimization program which includes automated exposure control, adjustment of the mA and/or kV according to patient size and/or use of iterative reconstruction technique. COMPARISON:  Chest radiograph 03/12/2022. FINDINGS: CT CHEST FINDINGS Cardiovascular: Heart size upper normal. Tortuous thoracic aorta. Coronary artery calcifications. Trace pericardial effusion. Mediastinum/Nodes: Scattered small mediastinal lymph nodes not enlarged by size criteria. Small amount of enteric contrast within the mid esophagus. No associated wall thickening. No thyroid nodule. Lungs/Pleura: There are small bilateral pleural effusions and atelectasis in the lower lobes. Streaky opacities within both lower lobes also favor atelectasis. No pulmonary mass. No endobronchial debris or lesion. Occasional areas of septal thickening. Minimal apical predominant emphysema. Musculoskeletal: There are multiple lytic lesions consistent with history of multiple myeloma. Largest lesions are in the sternal manubrium and left scapula. There are multiple lesions  throughout the thoracic spine. No pathologic compression fracture. No evidence of epidural tumor extension. Chest wall soft tissues are unremarkable. CT ABDOMEN PELVIS FINDINGS Hepatobiliary: 14 mm water density lesion in the left hepatic lobe series 2, image 55. Additional subcentimeter hypodensity in the right and left hepatic lobe Gallbladder physiologically distended, no calcified stone. No biliary dilatation. Pancreas: No ductal dilatation or inflammation. Spleen: Normal in size without focal abnormality. Adrenals/Urinary Tract: Normal adrenal glands. Lobulated bilateral renal contours suggest scarring. No significant perinephric edema. No renal calculi. 11 mm cyst in the lower right kidney, needs no further follow-up. No evidence of perinephric collection. Unremarkable urinary bladder. Stomach/Bowel: Small duodenal diverticulum stomach is unremarkable. No bowel obstruction or inflammation. Normal appendix. Minimal sigmoid diverticulosis. No diverticulitis or acute colonic inflammation. Vascular/Lymphatic: Mild aorto bi-iliac atherosclerosis. No abdominopelvic adenopathy. Reproductive: Prostate is unremarkable. Other: No free air. No free fluid. No abdominopelvic collection. Slight subcutaneous edema in the flanks. Small bilateral fat containing inguinal hernias. Musculoskeletal: Multiple lytic lesions in the lumbar spine consistent with known myeloma. No pathologic compression fracture. Scattered lytic lesions in the pelvis. No evident lesion in the proximal for more to place the patient at risk of pathologic femur fracture. IMPRESSION: 1. Three hypodense liver lesions measures simple fluid density are likely cysts, although there are no prior exams to establish stability. The largest measures  14 mm, 2 additional lesions are subcentimeter. The possibility of hepatic abscess is not entirely excluded on this unenhanced exam, but felt less likely. Consider further assessment with hepatic MRI as clinically  indicated. 2. Small bilateral pleural effusions with compressive and streaky atelectasis in the lower lobes. Occasional areas of septal thickening, can be seen with pulmonary edema. 3. Innumerable lytic lesions throughout the skeleton consistent with known myeloma. No evidence of pathologic fracture. 4. Mild colonic diverticulosis without diverticulitis. Aortic Atherosclerosis (ICD10-I70.0) and Emphysema (ICD10-J43.9). Electronically Signed   By: Keith Rake M.D.   On: 03/14/2022 22:11     Marzetta Board, MD, PhD [facility]  Between 7 am - 7 pm I am available, please contact me via Amion (for emergencies) or Securechat (non urgent messages)  Between 7 pm - 7 am I am not available, please contact night coverage MD/APP via Amion

## 2022-03-16 ENCOUNTER — Encounter (HOSPITAL_COMMUNITY): Payer: Self-pay | Admitting: Internal Medicine

## 2022-03-16 DIAGNOSIS — R509 Fever, unspecified: Secondary | ICD-10-CM | POA: Diagnosis not present

## 2022-03-16 LAB — CBC
HCT: 24.7 % — ABNORMAL LOW (ref 39.0–52.0)
Hemoglobin: 7.8 g/dL — ABNORMAL LOW (ref 13.0–17.0)
MCH: 33.9 pg (ref 26.0–34.0)
MCHC: 31.6 g/dL (ref 30.0–36.0)
MCV: 107.4 fL — ABNORMAL HIGH (ref 80.0–100.0)
Platelets: 421 10*3/uL — ABNORMAL HIGH (ref 150–400)
RBC: 2.3 MIL/uL — ABNORMAL LOW (ref 4.22–5.81)
RDW: 15.7 % — ABNORMAL HIGH (ref 11.5–15.5)
WBC: 5.8 10*3/uL (ref 4.0–10.5)
nRBC: 0 % (ref 0.0–0.2)

## 2022-03-16 NOTE — Plan of Care (Signed)

## 2022-03-16 NOTE — Plan of Care (Signed)
  Problem: Education: Goal: Knowledge of General Education information will improve Description: Including pain rating scale, medication(s)/side effects and non-pharmacologic comfort measures Outcome: Progressing   Problem: Activity: Goal: Risk for activity intolerance will decrease Outcome: Progressing   Problem: Coping: Goal: Level of anxiety will decrease Outcome: Progressing   Problem: Elimination: Goal: Will not experience complications related to bowel motility Outcome: Progressing Goal: Will not experience complications related to urinary retention Outcome: Progressing   Problem: Safety: Goal: Ability to remain free from injury will improve Outcome: Progressing   Problem: Skin Integrity: Goal: Risk for impaired skin integrity will decrease Outcome: Progressing

## 2022-03-16 NOTE — Plan of Care (Signed)
  Problem: Education: Goal: Knowledge of General Education information will improve Description: Including pain rating scale, medication(s)/side effects and non-pharmacologic comfort measures Outcome: Progressing   Problem: Clinical Measurements: Goal: Ability to maintain clinical measurements within normal limits will improve Outcome: Progressing Goal: Cardiovascular complication will be avoided Outcome: Progressing   Problem: Skin Integrity: Goal: Risk for impaired skin integrity will decrease Outcome: Progressing

## 2022-03-16 NOTE — Progress Notes (Signed)
PROGRESS NOTE  Darren Hinton. ZOX:[id number] DOB: 01-21-1946 DOA: 03/09/2022 PCP: [name], MD   LOS: 7 days   Brief Narrative / Interim history: 76 year old male with multiple myeloma recently had chemotherapy 6/26, PAF not on anticoagulation, HTN, HLD, hypothyroidism, comes into the hospital with subjective fevers.  Imaging was concerning for developing pneumonia, he was placed on antibiotics and was admitted to the hospital.  He developed A-fib with RVR  Subjective / 24h Interval events: Remains afebrile.  Feeling well.  Assesement and Plan: Principal problem Fever due to presumed pneumonia-intermittently febrile till 7/7, now afebrile so far cultures are without growth. He does not really have significant respiratory symptoms so source may be different.  Monitor cultures.  COVID and RVP negative.  Procalcitonin was elevated.  Due to persistent fevers ID consulted, appreciate input.  He underwent a CT scan of the abdomen and pelvis which showed some liver cysts, very small, there was a question about abscesses.  Clinically doubt liver abscess.  His fever has now resolved for 48 hours.  Treat empirically for 5 days, stop antibiotics today.  If remains afebrile could potentially go home tomorrow with outpatient follow-up to reimage his liver cysts in several weeks.  Active problems PAF, A-fib with RVR-patient placed on anticoagulation with heparin, amiodarone infusion.  Cardiology consulted, underwent TEE cardioversion 7/7 but this was unsuccessful.  Continue amiodarone, Eliquis.  He is rate controlled  Hypovolemic hyponatremia-sodium has now normalized  Hypokalemia, hypophosphatemia-monitor and replete as indicated.  Potassium is normal this morning  CKD 3B-appears to be close to baseline in the 2 range.  Creatinine has remained stable  Anemia of chronic disease, macrocytic anemia-B12 borderline on the lower side of normal, will replete.  He is also iron deficient, placed on  iron.  He also has underlying malignancy  Multiple myeloma, recent chemotherapy-outpatient management, seeing Dr. [name]  Thrombocytopenia-monitor, platelets remain normal  Hypothyroidism-continue Synthroid.  TSH unremarkable  Scheduled Meds:  [START ON 03/22/2022] amiodarone  200 mg Oral Daily   amiodarone  200 mg Oral BID   apixaban  5 mg Oral BID   cyanocobalamin  1,000 mcg Intramuscular Daily   ferrous gluconate  324 mg Oral TID WC   guaiFENesin  1,200 mg Oral BID   levothyroxine  100 mcg Oral QAC breakfast   metoprolol succinate  50 mg Oral Daily   senna-docusate  2 tablet Oral BID   Continuous Infusions:   PRN Meds:.acetaminophen, HYDROmorphone (DILAUDID) injection, melatonin, ondansetron (ZOFRAN) IV, mouth rinse, oxyCODONE, polyethylene glycol  Diet Orders (From admission, onward)     Start     Ordered   03/14/22 1328  Diet regular Room service appropriate? Yes; Fluid consistency: Thin  Diet effective now       Question Answer Comment  Room service appropriate? Yes   Fluid consistency: Thin      03/14/22 1327            DVT prophylaxis: SCDs Start: 03/09/22 2311 apixaban (ELIQUIS) tablet 5 mg   Lab Results  Component Value Date   PLT 421 (H) 03/16/2022      Code Status: Full Code  Family Communication: wife at bedside  Status is: Inpatient  Remains inpatient appropriate because: Severity of illness   Level of care: Telemetry  Consultants:  Cardiology   Objective: Vitals:   03/16/22 0008 03/16/22 0159 03/16/22 0453 03/16/22 0920  BP: 94/68 104/72 94/63 105/72  Pulse: 79 76 79   Resp:   18   Temp:  98.2 F (36.8 C)   TempSrc:   Oral   SpO2:   99%   Weight:      Height:        Intake/Output Summary (Last 24 hours) at 03/16/2022 1215 Last data filed at 03/16/2022 1031 Gross per 24 hour  Intake 1080 ml  Output 350 ml  Net 730 ml    Wt Readings from Last 3 Encounters:  03/14/22 98 kg  03/08/22 98.9 kg  03/03/22 99.2 kg     Examination:  Constitutional: NAD Eyes: lids and conjunctivae normal, no scleral icterus ENMT: mmm Neck: normal, supple Respiratory: clear to auscultation bilaterally, no wheezing, no crackles. Normal respiratory effort.  Cardiovascular: Regular rate and rhythm, no murmurs / rubs / gallops. No LE edema. Abdomen: soft, no distention, no tenderness. Bowel sounds positive.  Skin: no rashes Neurologic: no focal deficits, equal strength   Data Reviewed: I have independently reviewed following labs and imaging studies   CBC Recent Labs  Lab 03/09/22 2037 03/10/22 0401 03/11/22 0356 03/12/22 0347 03/13/22 0417 03/14/22 0452 03/15/22 0513 03/16/22 0416  WBC 4.3 5.7 3.4* 3.7* 4.8 6.0 7.6 5.8  HGB 8.6* 9.1* 8.4* 7.7* 7.8* 8.1* 8.3* 7.8*  HCT 25.5* 28.0* 26.1* 23.7* 24.2* 24.8* 26.1* 24.7*  PLT 99* 98* 112* 141* 204 233 359 421*  MCV 104.5* 108.1* 107.9* 105.8* 106.6* 106.0* 107.4* 107.4*  MCH 35.2* 35.1* 34.7* 34.4* 34.4* 34.6* 34.2* 33.9  MCHC 33.7 32.5 32.2 32.5 32.2 32.7 31.8 31.6  RDW 14.3 14.5 14.6 15.1 15.3 15.7* 15.8* 15.7*  LYMPHSABS 0.3* 0.3* 0.3* 0.5*  --   --   --   --   MONOABS 0.4 0.6 0.3 0.4  --   --   --   --   EOSABS 0.0 0.0 0.1 0.2  --   --   --   --   BASOSABS 0.0 0.0 0.0 0.0  --   --   --   --      Recent Labs  Lab 03/09/22 2037 03/10/22 0401 03/11/22 0356 03/11/22 1425 03/12/22 4259 03/12/22 0644 03/13/22 0109 03/13/22 0417 03/14/22 0848 03/14/22 1250 03/15/22 0513  NA 134* 136 137  --  135  --   --  136 133*  --  139  K 3.4* 3.6 4.1  --  4.2  --   --  4.5 4.3  --  4.5  CL 108 110 110  --  110  --   --  110 107  --  113*  CO2 18* 18* 20*  --  19*  --   --  18* 16*  --  15*  GLUCOSE 106* 100* 115*  --  103*  --   --  105* 97  --  85  BUN _0 --  19  --   --  20 18  --  17  CREATININE 2.01* 1.99* 2.12*  --  2.04*  --   --  2.27* 2.17*  --  2.05*  CALCIUM 7.9* 7.9* 8.0*  --  7.7*  --   --  8.0* 8.0*  --  8.3*  AST 25 30 41  --  31  --    --  29 34  --   --   ALT _1 --  25  --   --  25 31  --   --   ALKPHOS 76 73 70  --  69  --   --  71 71  --   --  BILITOT 1.0 1.0 0.7  --  0.7  --   --  0.6 0.7  --   --   ALBUMIN 3.6 3.4* 3.0*  --  2.9*  --   --  3.0* 3.0*  --   --   MG 2.2 1.8 2.6*  --  2.4  --   --  2.2 2.3  --   --   PROCALCITON  --   --   --  1.80 1.48  --   --  1.48  --   --   --   LATICACIDVEN 0.9  --   --   --   --   --  1.2  --   --   --   --   INR 1.1  --   --   --   --   --   --   --   --  1.4*  --   TSH  --   --   --   --   --  3.629  --   --   --   --   --      ------------------------------------------------------------------------------------------------------------------ No results for input(s): "CHOL", "HDL", "LDLCALC", "TRIG", "CHOLHDL", "LDLDIRECT" in the last 72 hours.  No results found for: "HGBA1C" ------------------------------------------------------------------------------------------------------------------ No results for input(s): "TSH", "T4TOTAL", "T3FREE", "THYROIDAB" in the last 72 hours.  Invalid input(s): "FREET3"   Cardiac Enzymes No results for input(s): "CKMB", "TROPONINI", "MYOGLOBIN" in the last 168 hours.  Invalid input(s): "CK" ------------------------------------------------------------------------------------------------------------------ No results found for: "BNP"  CBG: No results for input(s): "GLUCAP" in the last 168 hours.  Recent Results (from the past 240 hour(s))  SARS Coronavirus 2 by RT PCR (hospital order, performed in [facility] hospital lab) *cepheid single result test* Anterior Nasal Swab     Status: None   Collection Time: 03/08/22 11:13 PM   Specimen: Anterior Nasal Swab  Result Value Ref Range Status   SARS Coronavirus 2 by RT PCR NEGATIVE NEGATIVE Final    Comment: (NOTE) SARS-CoV-2 target nucleic acids are NOT DETECTED.  The SARS-CoV-2 RNA is generally detectable in upper and lower respiratory specimens during the acute phase of infection.  The lowest concentration of SARS-CoV-2 viral copies this assay can detect is 250 copies / mL. A negative result does not preclude SARS-CoV-2 infection and should not be used as the sole basis for treatment or other patient management decisions.  A negative result may occur with improper specimen collection / handling, submission of specimen other than nasopharyngeal swab, presence of viral mutation(s) within the areas targeted by this assay, and inadequate number of viral copies (<250 copies / mL). A negative result must be combined with clinical observations, patient history, and epidemiological information.  Fact Sheet for Patients:   https://www.patel.info/  Fact Sheet for Healthcare Providers: https://hall.com/  This test is not yet approved or  cleared by the [place] FDA and has been authorized for detection and/or diagnosis of SARS-CoV-2 by FDA under an Emergency Use Authorization (EUA).  This EUA will remain in effect (meaning this test can be used) for the duration of the COVID-19 declaration under Section 564(b)(1) of the Act, 21 U.S.C. section 360bbb-3(b)(1), unless the authorization is terminated or revoked sooner.  Performed at [facility], [place] [address] [place], [place] [address]   Blood culture (routine x 2)     Status: None   Collection Time: 03/08/22 11:14 PM   Specimen: BLOOD  Result Value Ref Range Status   Specimen Description  Final    BLOOD Performed at [facility], [place] [address], [place], [place] [address]    Special Requests   Final    BOTTLES DRAWN AEROBIC AND ANAEROBIC Blood Culture adequate volume Performed at [place] [address] [place], [place] [address]    Culture   Final    NO GROWTH 5 DAYS Performed at [facility], [place] [address] [place], [place] [address]    Report Status 03/14/2022 FINAL  Final  Blood culture  (routine x 2)     Status: None   Collection Time: 03/08/22 11:50 PM   Specimen: BLOOD  Result Value Ref Range Status   Specimen Description   Final    BLOOD LEFT ANTECUBITAL Performed at [facility] [address] [place], [place] [address]    Special Requests   Final    BOTTLES DRAWN AEROBIC AND ANAEROBIC Blood Culture results may not be optimal due to an excessive volume of blood received in culture bottles Performed at [facility] [address] [place], [place] [address]    Culture   Final    NO GROWTH 5 DAYS Performed at [facility], [place] [address], [place], [place] [address]    Report Status 03/14/2022 FINAL  Final  Urine Culture     Status: Abnormal   Collection Time: 03/09/22 12:04 AM   Specimen: Urine, Clean Catch  Result Value Ref Range Status   Specimen Description   Final    URINE, CLEAN CATCH Performed at [facility], [place] [address] [place], [place] [address]    Special Requests   Final    NONE Performed at [facility], [place] [address] [place], [place] [address]    Culture (A)  Final    <10,000 COLONIES/mL INSIGNIFICANT GROWTH Performed at [facility] [address] [place], [place] [address]    Report Status 03/10/2022 FINAL  Final  Culture, blood (Routine x 2)     Status: None   Collection Time: 03/09/22  8:37 PM   Specimen: BLOOD  Result Value Ref Range Status   Specimen Description   Final    BLOOD LEFT ANTECUBITAL Performed at [facility] [address], [place], [place] [address]    Special Requests   Final    BOTTLES DRAWN AEROBIC AND ANAEROBIC Blood Culture results may not be optimal due to an excessive volume of blood received in culture bottles Performed at [facility] [address] [place], [place] [address]    Culture   Final    NO GROWTH 5 DAYS Performed at [facility], [place] [address]  [place], [place] [address]    Report Status 03/15/2022 FINAL  Final  Culture, blood (Routine x 2)     Status: None   Collection Time: 03/09/22  8:40 PM   Specimen: BLOOD  Result Value Ref Range Status   Specimen Description   Final    BLOOD BLOOD LEFT HAND Performed at [place] [address], [place], [place] [address]    Special Requests   Final    BOTTLES DRAWN AEROBIC AND ANAEROBIC Blood Culture results may not be optimal due to an inadequate volume of blood received in culture bottles Performed at [place] [address] [place],  [address]    Culture   Final    NO GROWTH 5 DAYS Performed  at [facility], [place] [address], [place], [place] [address]    Report Status 03/15/2022 FINAL  Final  MRSA Next Gen by PCR, Nasal     Status: None   Collection Time: 03/10/22 12:17 PM   Specimen: Nasal Mucosa; Nasal Swab  Result Value Ref Range Status   MRSA by PCR Next Gen NOT DETECTED NOT DETECTED Final    Comment: (NOTE) The GeneXpert MRSA Assay (FDA approved for NASAL specimens only), is one component of a comprehensive MRSA colonization surveillance program. It is not intended to diagnose MRSA infection nor to guide or monitor treatment for MRSA infections. Test performance is not FDA approved in patients less than 76 years old. Performed at [facility], [place] [address], [place], [name] [address]   Respiratory (~20 pathogens) panel by PCR     Status: None   Collection Time: 03/11/22  1:21 PM   Specimen: Nasopharyngeal Swab; Respiratory  Result Value Ref Range Status   Adenovirus NOT DETECTED NOT DETECTED Final   Coronavirus 229E NOT DETECTED NOT DETECTED Final    Comment: (NOTE) The Coronavirus on the Respiratory Panel, DOES NOT test for the novel  Coronavirus (2019 nCoV)    Coronavirus HKU1 NOT DETECTED NOT DETECTED Final   Coronavirus NL63 NOT DETECTED NOT DETECTED Final   Coronavirus OC43 NOT  DETECTED NOT DETECTED Final   Metapneumovirus NOT DETECTED NOT DETECTED Final   Rhinovirus / Enterovirus NOT DETECTED NOT DETECTED Final   Influenza A NOT DETECTED NOT DETECTED Final   Influenza B NOT DETECTED NOT DETECTED Final   Parainfluenza Virus 1 NOT DETECTED NOT DETECTED Final   Parainfluenza Virus 2 NOT DETECTED NOT DETECTED Final   Parainfluenza Virus 3 NOT DETECTED NOT DETECTED Final   Parainfluenza Virus 4 NOT DETECTED NOT DETECTED Final   Respiratory Syncytial Virus NOT DETECTED NOT DETECTED Final   Bordetella pertussis NOT DETECTED NOT DETECTED Final   Bordetella Parapertussis NOT DETECTED NOT DETECTED Final   Chlamydophila pneumoniae NOT DETECTED NOT DETECTED Final   Mycoplasma pneumoniae NOT DETECTED NOT DETECTED Final    Comment: Performed at [facility], [place]. [address], [place], [place] [address]  Culture, blood (Routine X 2) w Reflex to ID Panel     Status: None (Preliminary result)   Collection Time: 03/13/22  4:16 AM   Specimen: BLOOD  Result Value Ref Range Status   Specimen Description   Final    BLOOD LEFT ANTECUBITAL Performed at [facility] [address] [place], [place] [address]    Special Requests   Final    BOTTLES DRAWN AEROBIC AND ANAEROBIC Blood Culture adequate volume Performed at [facility] [address], [place], [place] [address]    Culture   Final    NO GROWTH 3 DAYS Performed at [facility], [place] [address] [place], [place] [address]    Report Status PENDING  Incomplete  Culture, blood (Routine X 2) w Reflex to ID Panel     Status: None (Preliminary result)   Collection Time: 03/13/22  4:17 AM   Specimen: BLOOD  Result Value Ref Range Status   Specimen Description   Final    BLOOD BLOOD RIGHT HAND Performed at [facility] [address], [place], [place] [address]    Special Requests   Final    BOTTLES DRAWN AEROBIC AND ANAEROBIC Blood Culture adequate  volume Performed at [place] [address]  [place], [place] [address]    Culture   Final    NO GROWTH 3 DAYS Performed at [facility], [place] [address] [place], [place] [address]    Report Status PENDING  Incomplete     Radiology Studies: No results found.   Marzetta Board, MD, PhD [facility]  Between 7 am - 7 pm I am available, please contact me via Amion (for emergencies) or Securechat (non urgent messages)  Between 7 pm - 7 am I am not available, please contact night coverage MD/APP via Amion

## 2022-03-17 ENCOUNTER — Inpatient Hospital Stay: Payer: No Typology Code available for payment source

## 2022-03-17 ENCOUNTER — Telehealth: Payer: Self-pay | Admitting: *Deleted

## 2022-03-17 ENCOUNTER — Inpatient Hospital Stay: Payer: No Typology Code available for payment source | Admitting: Hematology and Oncology

## 2022-03-17 DIAGNOSIS — R509 Fever, unspecified: Secondary | ICD-10-CM | POA: Diagnosis not present

## 2022-03-17 DIAGNOSIS — K7689 Other specified diseases of liver: Secondary | ICD-10-CM

## 2022-03-17 LAB — CBC
HCT: 24.9 % — ABNORMAL LOW (ref 39.0–52.0)
Hemoglobin: 7.7 g/dL — ABNORMAL LOW (ref 13.0–17.0)
MCH: 33.6 pg (ref 26.0–34.0)
MCHC: 30.9 g/dL (ref 30.0–36.0)
MCV: 108.7 fL — ABNORMAL HIGH (ref 80.0–100.0)
Platelets: 507 10*3/uL — ABNORMAL HIGH (ref 150–400)
RBC: 2.29 MIL/uL — ABNORMAL LOW (ref 4.22–5.81)
RDW: 15.7 % — ABNORMAL HIGH (ref 11.5–15.5)
WBC: 5.4 10*3/uL (ref 4.0–10.5)
nRBC: 0 % (ref 0.0–0.2)

## 2022-03-17 MED ORDER — METOPROLOL SUCCINATE ER 50 MG PO TB24: 50.0000 mg | ORAL_TABLET | Freq: Every day | ORAL | 0 refills | Status: DC

## 2022-03-17 MED ORDER — AMIODARONE HCL 200 MG PO TABS: 200.0000 mg | ORAL_TABLET | Freq: Two times a day (BID) | ORAL | 0 refills | Status: DC

## 2022-03-17 MED ORDER — APIXABAN 5 MG PO TABS: 5.0000 mg | ORAL_TABLET | Freq: Two times a day (BID) | ORAL | 0 refills | Status: DC

## 2022-03-17 MED ORDER — FERROUS GLUCONATE 324 (38 FE) MG PO TABS: 324.0000 mg | ORAL_TABLET | Freq: Three times a day (TID) | ORAL | 0 refills | Status: AC

## 2022-03-17 MED ORDER — AMIODARONE HCL 200 MG PO TABS: 200.0000 mg | ORAL_TABLET | Freq: Every day | ORAL | 0 refills | Status: DC

## 2022-03-17 NOTE — Telephone Encounter (Signed)
Pt is being discharged from [facility] hospital today. Scheduling message sent for him to be seen next week.

## 2022-03-17 NOTE — Plan of Care (Signed)
  Problem: Education: Goal: Knowledge of General Education information will improve Description: Including pain rating scale, medication(s)/side effects and non-pharmacologic comfort measures Outcome: Adequate for Discharge   Problem: Clinical Measurements: Goal: Ability to maintain clinical measurements within normal limits will improve Outcome: Adequate for Discharge Goal: Diagnostic test results will improve Outcome: Adequate for Discharge Goal: Cardiovascular complication will be avoided Outcome: Adequate for Discharge   Problem: Education: Goal: Knowledge of General Education information will improve Description: Including pain rating scale, medication(s)/side effects and non-pharmacologic comfort measures Outcome: Adequate for Discharge   Problem: Clinical Measurements: Goal: Ability to maintain clinical measurements within normal limits will improve Outcome: Adequate for Discharge Goal: Diagnostic test results will improve Outcome: Adequate for Discharge Goal: Cardiovascular complication will be avoided Outcome: Adequate for Discharge

## 2022-03-17 NOTE — Discharge Summary (Signed)
Physician Discharge Summary  Darren Hinton. NAT:[id number] DOB: 05-23-1946 DOA: 03/09/2022  PCP: [name], MD  Admit date: 03/09/2022 Discharge date: 03/17/2022  Admitted From: home Disposition:  home  Recommendations for Outpatient Follow-up:  Follow up with PCP in 1-2 weeks Please obtain BMP/CBC in one week Follow up with Dr Vaughan Browner in 1 week  [place]: none Equipment/Devices: none  Discharge Condition: stable CODE STATUS: Full code Diet Orders (From admission, onward)     Start     Ordered   03/14/22 1328  Diet regular Room service appropriate? Yes; Fluid consistency: Thin  Diet effective now       Question Answer Comment  Room service appropriate? Yes   Fluid consistency: Thin      03/14/22 1327            HPI: Per admitting MD, Darren Hinton. is a 76 y.o. male with medical history significant for multiple myeloma on recent chemotherapy (03/03/22), paroxysmal A-fib not oral anticoagulated, essential hypertension, hyperlipidemia, hypothyroidism, mitral valve prolapse, hypothyroidism, chronic anxiety/depression who presented to [facility] ED from home with complaints of having recurrent subjective fevers with Tmax 102 at home.  Associated with mild dysuria.  Upon presentation to the ED, febrile with Tmax 102.8, no clear source of infection, UA negative for pyuria, electrolytes abnormalities and anemia of chronic disease.  Cultures were obtained and the patient was placed on antibiotics empirically Rocephin.  EDP discussed the case with medical oncology Dr. [name] who recommended admission for further work-up. To note, the patient presented to the ED the day prior to presentation for the same, he was sent home with oral antibiotics for fever of unknown origin.   In the ED, febrile with Tmax 102.8.  Received 1 L IV fluid bolus normal saline, 1 dose cefepime, 1 dose 650 mg p.o. Tylenol.  TRH, hospitalist service, was asked to admit.  Hospital Course / Discharge  diagnoses: Principal Problem:   Fever Active Problems:   Atrial fibrillation with RVR (HCC)   SIRS (systemic inflammatory response syndrome) (HCC)   Tachycardia induced cardiomyopathy (HCC)   Pneumonia due to infectious organism   Principal problem Fever due to presumed pneumonia-patient was admitted to the hospital with fever, and imaging raise concern of pneumonia.  Did not really have any significant respiratory symptoms other than intermittent cough so source may have been different but pneumonia could not be ruled out completely.  COVID and RVP were negative.  Procalcitonin was elevated.  He was placed on broad-spectrum antibiotics, but due to persistent fevers ID was consulted.  He underwent a CT scan of the abdomen and pelvis which showed some liver cysts, very small, there was a question about abscesses.  Clinically doubt liver abscess.  He has completed 5 days of antibiotics, discussed with ID and antibiotics were discontinued.  He has been now afebrile for 3 days, clinically returned to baseline, will be discharged home in stable condition and needs no further antibiotics.  He will need outpatient follow-up with ID for repeat liver imaging   Active problems PAF, A-fib with RVR-patient placed on anticoagulation with heparin, amiodarone infusion.  Cardiology consulted, underwent TEE cardioversion 7/7 but this was unsuccessful.  He was transitioned to p.o. amiodarone and Eliquis, and eventually self converted to sinus rhythm on the day of discharge.  He is to continue amiodarone and metoprolol upon discharge per cardiology  Hypovolemic hyponatremia-sodium has now normalized Hypokalemia, hypophosphatemia-improved with repletion  CKD 3B-appears to be close to baseline  in the 2 range.  Creatinine has remained stable Anemia of chronic disease, macrocytic anemia-B12 borderline on the lower side of normal, will replete.  He is also iron deficient, placed on iron.  He also has underlying  malignancy Multiple myeloma, recent chemotherapy-outpatient management, seeing Dr. [name] Thrombocytopenia-monitor, platelets remain normal Hypothyroidism-continue Synthroid.  TSH unremarkable  Sepsis ruled out   Discharge Instructions   Allergies as of 03/17/2022       Reactions   Zithromax [azithromycin] Other (See Comments)        Medication List     STOP taking these medications    amLODipine 5 MG tablet Commonly known as: NORVASC   cefdinir 300 MG capsule Commonly known as: OMNICEF   losartan-hydrochlorothiazide 50-12.5 MG tablet Commonly known as: HYZAAR       TAKE these medications    acyclovir 400 MG tablet Commonly known as: ZOVIRAX Take 1 tablet (400 mg total) by mouth 2 (two) times daily.   allopurinol 300 MG tablet Commonly known as: ZYLOPRIM Take 1 tablet (300 mg total) by mouth daily.   ALPRAZolam 0.25 MG tablet Commonly known as: XANAX Take 0.25 mg by mouth daily as needed. Anxiety   amiodarone 200 MG tablet Commonly known as: PACERONE Take 1 tablet (200 mg total) by mouth 2 (two) times daily for 4 days.   amiodarone 200 MG tablet Commonly known as: PACERONE Take 1 tablet (200 mg total) by mouth daily. Start taking on: March 22, 2022   apixaban 5 MG Tabs tablet Commonly known as: ELIQUIS Take 1 tablet (5 mg total) by mouth 2 (two) times daily.   atorvastatin 20 MG tablet Commonly known as: LIPITOR Take 20 mg by mouth daily.   dexamethasone 4 MG tablet Commonly known as: DECADRON Take 10 tablets (40 mg total) by mouth once a week. Take steroids pills the in the morning on chemotherapy days.   ergocalciferol 1.25 MG (50000 UT) capsule Commonly known as: VITAMIN D2 Take 50,000 Units by mouth every Wednesday.   ferrous gluconate 324 MG tablet Commonly known as: FERGON Take 1 tablet (324 mg total) by mouth 3 (three) times daily with meals.   fluticasone 50 MCG/ACT nasal spray Commonly known as: FLONASE Place 2 sprays into the  nose daily as needed. Allergies   levothyroxine 100 MCG tablet Commonly known as: SYNTHROID Take 100 mcg by mouth daily before breakfast.   loratadine 10 MG tablet Commonly known as: CLARITIN Take 10 mg by mouth daily.   meclizine 25 MG tablet Commonly known as: ANTIVERT Take 1 tablet (25 mg total) by mouth 3 (three) times daily as needed for dizziness.   metoprolol succinate 50 MG 24 hr tablet Commonly known as: TOPROL-XL Take 1 tablet (50 mg total) by mouth daily. Take with or immediately following a meal. What changed:  medication strength how much to take   ondansetron 8 MG tablet Commonly known as: ZOFRAN Take 1 tablet (8 mg total) by mouth every 8 (eight) hours as needed.   prochlorperazine 10 MG tablet Commonly known as: COMPAZINE Take 1 tablet (10 mg total) by mouth every 6 (six) hours as needed for nausea or vomiting.   senna-docusate 8.6-50 MG tablet Commonly known as: Senokot-S Take 1 tablet by mouth at bedtime as needed for mild constipation.   vitamin B-12 500 MCG tablet Commonly known as: CYANOCOBALAMIN Take 1 tablet by mouth daily.        Follow-up Information     [facility] Follow up on  03/27/2022.   Specialty: Cardiology Why: _0  for afib follow up. Please call with for direction and parking code Contact information: [address] [id number] [place] [phone]                Consultations: ID Cardiology   Procedures/Studies:  CT CHEST ABDOMEN PELVIS WO CONTRAST  Result Date: 03/14/2022 CLINICAL DATA:  Sepsis r/o occult abscess History of multiple myeloma. EXAM: CT CHEST, ABDOMEN AND PELVIS WITHOUT CONTRAST TECHNIQUE: Multidetector CT imaging of the chest, abdomen and pelvis was performed following the standard protocol without IV contrast. RADIATION DOSE REDUCTION: This exam was performed according to the departmental dose-optimization program which includes automated  exposure control, adjustment of the mA and/or kV according to patient size and/or use of iterative reconstruction technique. COMPARISON:  Chest radiograph 03/12/2022. FINDINGS: CT CHEST FINDINGS Cardiovascular: Heart size upper normal. Tortuous thoracic aorta. Coronary artery calcifications. Trace pericardial effusion. Mediastinum/Nodes: Scattered small mediastinal lymph nodes not enlarged by size criteria. Small amount of enteric contrast within the mid esophagus. No associated wall thickening. No thyroid nodule. Lungs/Pleura: There are small bilateral pleural effusions and atelectasis in the lower lobes. Streaky opacities within both lower lobes also favor atelectasis. No pulmonary mass. No endobronchial debris or lesion. Occasional areas of septal thickening. Minimal apical predominant emphysema. Musculoskeletal: There are multiple lytic lesions consistent with history of multiple myeloma. Largest lesions are in the sternal manubrium and left scapula. There are multiple lesions throughout the thoracic spine. No pathologic compression fracture. No evidence of epidural tumor extension. Chest wall soft tissues are unremarkable. CT ABDOMEN PELVIS FINDINGS Hepatobiliary: 14 mm water density lesion in the left hepatic lobe series 2, image 55. Additional subcentimeter hypodensity in the right and left hepatic lobe Gallbladder physiologically distended, no calcified stone. No biliary dilatation. Pancreas: No ductal dilatation or inflammation. Spleen: Normal in size without focal abnormality. Adrenals/Urinary Tract: Normal adrenal glands. Lobulated bilateral renal contours suggest scarring. No significant perinephric edema. No renal calculi. 11 mm cyst in the lower right kidney, needs no further follow-up. No evidence of perinephric collection. Unremarkable urinary bladder. Stomach/Bowel: Small duodenal diverticulum stomach is unremarkable. No bowel obstruction or inflammation. Normal appendix. Minimal sigmoid  diverticulosis. No diverticulitis or acute colonic inflammation. Vascular/Lymphatic: Mild aorto bi-iliac atherosclerosis. No abdominopelvic adenopathy. Reproductive: Prostate is unremarkable. Other: No free air. No free fluid. No abdominopelvic collection. Slight subcutaneous edema in the flanks. Small bilateral fat containing inguinal hernias. Musculoskeletal: Multiple lytic lesions in the lumbar spine consistent with known myeloma. No pathologic compression fracture. Scattered lytic lesions in the pelvis. No evident lesion in the proximal for more to place the patient at risk of pathologic femur fracture. IMPRESSION: 1. Three hypodense liver lesions measures simple fluid density are likely cysts, although there are no prior exams to establish stability. The largest measures 14 mm, 2 additional lesions are subcentimeter. The possibility of hepatic abscess is not entirely excluded on this unenhanced exam, but felt less likely. Consider further assessment with hepatic MRI as clinically indicated. 2. Small bilateral pleural effusions with compressive and streaky atelectasis in the lower lobes. Occasional areas of septal thickening, can be seen with pulmonary edema. 3. Innumerable lytic lesions throughout the skeleton consistent with known myeloma. No evidence of pathologic fracture. 4. Mild colonic diverticulosis without diverticulitis. Aortic Atherosclerosis (ICD10-I70.0) and Emphysema (ICD10-J43.9). Electronically Signed   By: Keith Rake M.D.   On: 03/14/2022 22:11   ECHO TEE  Result Date: 03/14/2022    TRANSESOPHOGEAL ECHO REPORT   Patient  Name:   Darren Hinton. Date of Exam: 03/14/2022 Medical Rec #:  [id number]           Height:       77.0 in Accession #:    [id number]          Weight:       217.6 lb Date of Birth:  [birth year]          BSA:          2.318 m Patient Age:    46 years            BP:           111/81 mmHg Patient Gender: M                   HR:           90 bpm. Exam Location:  Inpatient  Procedure: Transesophageal Echo, Cardiac Doppler and Color Doppler Indications:     A flutter  History:         Patient has prior history of Echocardiogram examinations, most                  recent 03/12/2022. Mitral Valve Disease, Arrythmias:Atrial                  Fibrillation; Risk Factors:Dyslipidemia and Hypertension.                  Hypothyroidism.  Sonographer:     Eartha Inch Referring Phys:  [id number] HAO MENG Diagnosing Phys: Lyman Bishop MD PROCEDURE: After discussion of the risks and benefits of a TEE, an informed consent was obtained from the patient. TEE procedure time was 8 minutes. The transesophogeal probe was passed without difficulty through the esophogus of the patient. Imaged were  obtained with the patient in a supine position. Sedation performed by different physician. Image quality was good. The patient developed no complications during the procedure. 1. Cardioverted 4 time(s). 2. Cardioverted at 120J, 150J and 200J x 2 biphasic. Converted to sinus after the initial shock, then back in afib- converted to sinus again for a few seconds with 200J, then back in afib. IMPRESSIONS  1. Left ventricular ejection fraction, by estimation, is 50 to 55%. The left ventricle has low normal function. There is mild left ventricular hypertrophy.  2. Right ventricular systolic function is normal. The right ventricular size is normal.  3. No left atrial/left atrial appendage thrombus was detected.  4. The mitral valve is grossly normal. Mild mitral valve regurgitation.  5. The aortic valve is tricuspid. Aortic valve regurgitation is not visualized.  6. 1. Cardioverted 4 time(s).     2. Cardioverted at 120J, 150J and 200J x 2 biphasic. Converted to sinus after the initial shock, then back in afib- converted to sinus again for a few seconds with 200J, then back in afib. Conclusion(s)/Recommendation(s): No evidence of vegetation/infective endocarditis on this transesophageael echocardiogram. No LA/LAA thrombus  identified. Unsuccessful cardioversion performed without restoration of normal sinus rhythm. FINDINGS  Left Ventricle: Left ventricular ejection fraction, by estimation, is 50 to 55%. The left ventricle has low normal function. The left ventricular internal cavity size was normal in size. There is mild left ventricular hypertrophy. Right Ventricle: The right ventricular size is normal. No increase in right ventricular wall thickness. Right ventricular systolic function is normal. Left Atrium: Left atrial size was normal in size. No left atrial/left atrial appendage thrombus was detected. Right Atrium: Right atrial size was  normal in size. Pericardium: There is no evidence of pericardial effusion. Mitral Valve: The mitral valve is grossly normal. Mild mitral valve regurgitation, with multiple jets. Tricuspid Valve: The tricuspid valve is grossly normal. Tricuspid valve regurgitation is trivial. Aortic Valve: The aortic valve is tricuspid. Aortic valve regurgitation is not visualized. Pulmonic Valve: The pulmonic valve was normal in structure. Pulmonic valve regurgitation is trivial. Aorta: The aortic root and ascending aorta are structurally normal, with no evidence of dilitation. IAS/Shunts: No atrial level shunt detected by color flow Doppler.   AORTA Ao Asc diam: 3.90 cm Lyman Bishop MD Electronically signed by Lyman Bishop MD Signature Date/Time: 03/14/2022/11:53:24 AM    Final (Updated)    ECHOCARDIOGRAM COMPLETE  Result Date: 03/12/2022    ECHOCARDIOGRAM REPORT   Patient Name:   Darren Hinton. Date of Exam: 03/12/2022 Medical Rec #:  [id number]           Height:       77.0 in Accession #:    [id number]          Weight:       217.6 lb Date of Birth:  [birth year]          BSA:          2.318 m Patient Age:    65 years            BP:           108/84 mmHg Patient Gender: M                   HR:           128 bpm. Exam Location:  Inpatient Procedure: 2D Echo, Cardiac Doppler, Color Doppler and Intracardiac             Opacification Agent Indications:    Atrial Flutter I48.92  History:        Patient has no prior history of Echocardiogram examinations.                 Risk Factors:Hypertension and Dyslipidemia. Hypothyroidism.                 Fever due to pneumonia. Chronic kidney disease, anemia.  Sonographer:    Darlina Sicilian RDCS Referring Phys: [id number] [place]  1. Left ventricular ejection fraction, by estimation, is 40 to 45%. The left ventricle has mildly decreased function. The left ventricle has no regional wall motion abnormalities. There is mild concentric left ventricular hypertrophy. Left ventricular diastolic parameters are consistent with Grade I diastolic dysfunction (impaired relaxation).  2. Right ventricular systolic function is normal. The right ventricular size is normal. There is mildly elevated pulmonary artery systolic pressure.  3. The mitral valve is normal in structure. Mild mitral valve regurgitation. No evidence of mitral stenosis.  4. The aortic valve is tricuspid. Aortic valve regurgitation is trivial. Aortic valve sclerosis is present, with no evidence of aortic valve stenosis.  5. The inferior vena cava is normal in size with greater than 50% respiratory variability, suggesting right atrial pressure of 3 mmHg. FINDINGS  Left Ventricle: Left ventricular ejection fraction, by estimation, is 40 to 45%. The left ventricle has mildly decreased function. The left ventricle has no regional wall motion abnormalities. Definity contrast agent was given IV to delineate the left ventricular endocardial borders. The left ventricular internal cavity size was normal in size. There is mild concentric left ventricular hypertrophy. Left ventricular diastolic parameters are consistent with Grade I diastolic  dysfunction (impaired relaxation). Right Ventricle: The right ventricular size is normal. No increase in right ventricular wall thickness. Right ventricular systolic function is  normal. There is mildly elevated pulmonary artery systolic pressure. The tricuspid regurgitant velocity is 2.66  m/s, and with an assumed right atrial pressure of 8 mmHg, the estimated right ventricular systolic pressure is 54.0 mmHg. Left Atrium: Left atrial size was normal in size. Right Atrium: Right atrial size was normal in size. Pericardium: There is no evidence of pericardial effusion. Mitral Valve: The mitral valve is normal in structure. Mild mitral valve regurgitation. No evidence of mitral valve stenosis. Tricuspid Valve: The tricuspid valve is normal in structure. Tricuspid valve regurgitation is not demonstrated. No evidence of tricuspid stenosis. Aortic Valve: The aortic valve is tricuspid. Aortic valve regurgitation is trivial. Aortic valve sclerosis is present, with no evidence of aortic valve stenosis. Pulmonic Valve: The pulmonic valve was normal in structure. Pulmonic valve regurgitation is not visualized. No evidence of pulmonic stenosis. Aorta: The aortic root is normal in size and structure. Venous: The inferior vena cava is normal in size with greater than 50% respiratory variability, suggesting right atrial pressure of 3 mmHg. IAS/Shunts: No atrial level shunt detected by color flow Doppler.  LEFT VENTRICLE PLAX 2D LVIDd:         4.60 cm      Diastology LVIDs:         3.30 cm      LV e' medial:    7.94 cm/s LV PW:         1.20 cm      LV E/e' medial:  13.1 LV IVS:        1.30 cm      LV e' lateral:   12.85 cm/s LVOT diam:     2.10 cm      LV E/e' lateral: 8.1 LV SV:         38 LV SV Index:   16 LVOT Area:     3.46 cm  LV Volumes (MOD) LV vol d, MOD A2C: 114.0 ml LV vol d, MOD A4C: 117.0 ml LV vol s, MOD A2C: 61.9 ml LV vol s, MOD A4C: 54.5 ml LV SV MOD A2C:     52.1 ml LV SV MOD A4C:     117.0 ml LV SV MOD BP:      54.9 ml RIGHT VENTRICLE RV S prime:     14.80 cm/s LEFT ATRIUM             Index        RIGHT ATRIUM           Index LA diam:        4.10 cm 1.77 cm/m   RA Area:     13.20 cm LA  Vol (A2C):   63.1 ml 27.22 ml/m  RA Volume:   31.60 ml  13.63 ml/m LA Vol (A4C):   70.4 ml 30.37 ml/m LA Biplane Vol: 72.2 ml 31.14 ml/m  AORTIC VALVE LVOT Vmax:   80.00 cm/s LVOT Vmean:  53.600 cm/s LVOT VTI:    0.109 m  AORTA Ao Root diam: 3.50 cm Ao Asc diam:  3.00 cm MITRAL VALVE                TRICUSPID VALVE MV Area (PHT): 4.19 cm     TR Peak grad:   28.3 mmHg MV Decel Time: 181 msec     TR Vmax:        266.00  cm/s MV E velocity: 103.75 cm/s                             SHUNTS                             Systemic VTI:  0.11 m                             Systemic Diam: 2.10 cm Kardie Tobb DO Electronically signed by Berniece Salines DO Signature Date/Time: 03/12/2022/2:19:52 PM    Final    DG CHEST PORT 1 VIEW  Result Date: 03/12/2022 CLINICAL DATA:  Shortness of breath. EXAM: PORTABLE CHEST 1 VIEW COMPARISON:  03/11/2022. FINDINGS: Similar mild interstitial and patchy airspace opacities in the left lung. Similar streaky opacities at the right lung base. No visible pleural effusions or pneumothorax. No acute osseous abnormality. Polyarticular degenerative change. IMPRESSION: 1. Similar mild interstitial and patchy airspace opacities in the left lung which could represent asymmetric edema or infection. 2. Similar mild streaky right basilar opacities, which could represent atelectasis and/or pneumonia. Electronically Signed   By: Margaretha Sheffield M.D.   On: 03/12/2022 08:08   VAS [place] LOWER EXTREMITY VENOUS (DVT)  Result Date: 03/12/2022  Lower Venous DVT Study Patient Name:  Darren Hinton.  Date of Exam:   03/11/2022 Medical Rec #: [id number]            Accession #:    [id number] Date of Birth: [birth year]           Patient Gender: M Patient Age:   87 years Exam Location:  [facility] Procedure:      VAS [place] LOWER EXTREMITY VENOUS (DVT) Referring Phys: Raiford Noble --------------------------------------------------------------------------------  Indications: Swelling.  Comparison Study: No prior  studies. Performing Technologist: Darlin Coco RDMS, RVT  Examination Guidelines: A complete evaluation includes B-mode imaging, spectral Doppler, color Doppler, and power Doppler as needed of all accessible portions of each vessel. Bilateral testing is considered an integral part of a complete examination. Limited examinations for reoccurring indications may be performed as noted. The reflux portion of the exam is performed with the patient in reverse Trendelenburg.  +---------+---------------+---------+-----------+----------+--------------+ RIGHT    CompressibilityPhasicitySpontaneityPropertiesThrombus Aging +---------+---------------+---------+-----------+----------+--------------+ CFV      Full           Yes      Yes                                 +---------+---------------+---------+-----------+----------+--------------+ SFJ      Full                                                        +---------+---------------+---------+-----------+----------+--------------+ FV Prox  Full                                                        +---------+---------------+---------+-----------+----------+--------------+ FV Mid   Full                                                        +---------+---------------+---------+-----------+----------+--------------+  FV DistalFull                                                        +---------+---------------+---------+-----------+----------+--------------+ PFV      Full                                                        +---------+---------------+---------+-----------+----------+--------------+ POP      Full           Yes      Yes                                 +---------+---------------+---------+-----------+----------+--------------+ PTV      Full                                                        +---------+---------------+---------+-----------+----------+--------------+ PERO     Full                                                         +---------+---------------+---------+-----------+----------+--------------+   +---------+---------------+---------+-----------+----------+--------------+ LEFT     CompressibilityPhasicitySpontaneityPropertiesThrombus Aging +---------+---------------+---------+-----------+----------+--------------+ CFV      Full           Yes      Yes                                 +---------+---------------+---------+-----------+----------+--------------+ SFJ      Full                                                        +---------+---------------+---------+-----------+----------+--------------+ FV Prox  Full                                                        +---------+---------------+---------+-----------+----------+--------------+ FV Mid   Full                                                        +---------+---------------+---------+-----------+----------+--------------+ FV DistalFull                                                        +---------+---------------+---------+-----------+----------+--------------+  PFV      Full                                                        +---------+---------------+---------+-----------+----------+--------------+ POP      Full           Yes      Yes                                 +---------+---------------+---------+-----------+----------+--------------+ PTV      Full                                                        +---------+---------------+---------+-----------+----------+--------------+ PERO     Full                                                        +---------+---------------+---------+-----------+----------+--------------+     Summary: RIGHT: - There is no evidence of deep vein thrombosis in the lower extremity.  - No cystic structure found in the popliteal fossa.  LEFT: - There is no evidence of deep vein thrombosis in the lower extremity.  - No  cystic structure found in the popliteal fossa.  *See table(s) above for measurements and observations. Electronically signed by Jamelle Haring on 03/12/2022 at 2:07:01 AM.    Final    DG CHEST PORT 1 VIEW  Result Date: 03/11/2022 CLINICAL DATA:  Shortness of breath EXAM: PORTABLE CHEST 1 VIEW COMPARISON:  03/08/2022 FINDINGS: Heart size within normal limits. Interval development of interstitial opacities within the left mid to lower lung. Elevation of the right hemidiaphragm with streaky right basilar opacity. No pleural effusion or pneumothorax. IMPRESSION: 1. Interval development of interstitial opacities within the left mid to lower lung concerning for atypical/viral infection. 2. Streaky right basilar opacity may represent atelectasis versus developing infection. Electronically Signed   By: Davina Poke D.O.   On: 03/11/2022 10:37   DG Chest Port 1 View  Result Date: 03/08/2022 CLINICAL DATA:  Questionable sepsis - evaluate for abnormality Fever.  Chemotherapy, most recently Monday. EXAM: PORTABLE CHEST 1 VIEW COMPARISON:  Chest radiograph 03/12/2014 FINDINGS: The heart is normal in size. Streaky opacity at the right lung base is unchanged from prior exam and most consistent with scarring. No evidence of acute airspace disease. No pulmonary edema, pleural effusion, or pneumothorax. No acute osseous abnormalities are seen on this portable exam, patient with history of myeloma. IMPRESSION: No acute chest findings. Unchanged scarring at the right lung base. Electronically Signed   By: Keith Rake M.D.   On: 03/08/2022 23:45     Subjective: - no chest pain, shortness of breath, no abdominal pain, nausea or vomiting.   Discharge Exam: BP 108/70 (BP Location: Right Arm)   Pulse 86   Temp 97.9 F (36.6 C) (Oral)   Resp 16   Ht _0  (1.956 m)   Wt 98 kg   SpO2 99%  BMI 25.61 kg/m   General: Pt is alert, awake, not in acute distress Cardiovascular: RRR, S1/S2 +, no rubs, no  gallops Respiratory: CTA bilaterally, no wheezing, no rhonchi Abdominal: Soft, NT, ND, bowel sounds + Extremities: no edema, no cyanosis    The results of significant diagnostics from this hospitalization (including imaging, microbiology, ancillary and laboratory) are listed below for reference.     Microbiology: Recent Results (from the past 240 hour(s))  SARS Coronavirus 2 by RT PCR (hospital order, performed in [facility] hospital lab) *cepheid single result test* Anterior Nasal Swab     Status: None   Collection Time: 03/08/22 11:13 PM   Specimen: Anterior Nasal Swab  Result Value Ref Range Status   SARS Coronavirus 2 by RT PCR NEGATIVE NEGATIVE Final    Comment: (NOTE) SARS-CoV-2 target nucleic acids are NOT DETECTED.  The SARS-CoV-2 RNA is generally detectable in upper and lower respiratory specimens during the acute phase of infection. The lowest concentration of SARS-CoV-2 viral copies this assay can detect is 250 copies / mL. A negative result does not preclude SARS-CoV-2 infection and should not be used as the sole basis for treatment or other patient management decisions.  A negative result may occur with improper specimen collection / handling, submission of specimen other than nasopharyngeal swab, presence of viral mutation(s) within the areas targeted by this assay, and inadequate number of viral copies (<250 copies / mL). A negative result must be combined with clinical observations, patient history, and epidemiological information.  Fact Sheet for Patients:   https://www.patel.info/  Fact Sheet for Healthcare Providers: https://hall.com/  This test is not yet approved or  cleared by the [place] FDA and has been authorized for detection and/or diagnosis of SARS-CoV-2 by FDA under an Emergency Use Authorization (EUA).  This EUA will remain in effect (meaning this test can be used) for the duration of the COVID-19  declaration under Section 564(b)(1) of the Act, 21 U.S.C. section 360bbb-3(b)(1), unless the authorization is terminated or revoked sooner.  Performed at [facility], [place] [address], [place], [place] [address]   Blood culture (routine x 2)     Status: None   Collection Time: 03/08/22 11:14 PM   Specimen: BLOOD  Result Value Ref Range Status   Specimen Description   Final    BLOOD Performed at [place] [address] [place], [place] [address]    Special Requests   Final    BOTTLES DRAWN AEROBIC AND ANAEROBIC Blood Culture adequate volume Performed at [place] [address], [place], [place] [address]    Culture   Final    NO GROWTH 5 DAYS Performed at [facility], [place] [address], [place], [place] [address]    Report Status 03/14/2022 FINAL  Final  Blood culture (routine x 2)     Status: None   Collection Time: 03/08/22 11:50 PM   Specimen: BLOOD  Result Value Ref Range Status   Specimen Description   Final    BLOOD LEFT ANTECUBITAL Performed at [place] [address] [place], [place] [address]    Special Requests   Final    BOTTLES DRAWN AEROBIC AND ANAEROBIC Blood Culture results may not be optimal due to an excessive volume of blood received in culture bottles Performed at [place] [address], [place], [place] [address]    Culture   Final    NO GROWTH 5 DAYS Performed  at [facility], [place] [address] [place], [place] [address]    Report Status 03/14/2022 FINAL  Final  Urine Culture     Status: Abnormal   Collection Time: 03/09/22 12:04 AM   Specimen: Urine, Clean Catch  Result Value Ref Range Status   Specimen Description   Final    URINE, CLEAN CATCH Performed at [facility], [place] [address] [place], [place] [address]    Special Requests   Final    NONE Performed at [facility], [place]  [address] [place], [place] [address]    Culture (A)  Final    <10,000 COLONIES/mL INSIGNIFICANT GROWTH Performed at [place] [address] [place], [place] [address]    Report Status 03/10/2022 FINAL  Final  Culture, blood (Routine x 2)     Status: None   Collection Time: 03/09/22  8:37 PM   Specimen: BLOOD  Result Value Ref Range Status   Specimen Description   Final    BLOOD LEFT ANTECUBITAL Performed at [place] [address], [place], [place] [address]    Special Requests   Final    BOTTLES DRAWN AEROBIC AND ANAEROBIC Blood Culture results may not be optimal due to an excessive volume of blood received in culture bottles Performed at [place] [address], [place], [place] [address]    Culture   Final    NO GROWTH 5 DAYS Performed at [facility], [place] [address] [place], [place] [address]    Report Status 03/15/2022 FINAL  Final  Culture, blood (Routine x 2)     Status: None   Collection Time: 03/09/22  8:40 PM   Specimen: BLOOD  Result Value Ref Range Status   Specimen Description   Final    BLOOD BLOOD LEFT HAND Performed at [place] [address], [place], [place] [address]    Special Requests   Final    BOTTLES DRAWN AEROBIC AND ANAEROBIC Blood Culture results may not be optimal due to an inadequate volume of blood received in culture bottles Performed at [place] [address], [place], [place] [address]    Culture   Final    NO GROWTH 5 DAYS Performed at [facility], [place] [address] [place], [place] [address]    Report Status 03/15/2022 FINAL  Final  MRSA Next Gen by PCR, Nasal     Status: None   Collection Time: 03/10/22 12:17 PM   Specimen: Nasal Mucosa; Nasal Swab  Result Value Ref Range Status   MRSA by PCR Next Gen NOT DETECTED NOT DETECTED Final    Comment: (NOTE) The GeneXpert MRSA Assay (FDA approved for NASAL specimens  only), is one component of a comprehensive MRSA colonization surveillance program. It is not intended to diagnose MRSA infection nor to guide or monitor treatment for MRSA infections. Test performance is not FDA approved in patients less than 30 years old. Performed at [facility]: [place] [address], [place] [address], [place],  [address]   Respiratory (~20 pathogens) panel by PCR     Status: None   Collection Time: 03/11/22  1:21 PM   Specimen: Nasopharyngeal Swab; Respiratory  Result Value Ref Range Status   Adenovirus NOT DETECTED NOT DETECTED Final   Coronavirus 229E NOT DETECTED NOT DETECTED Final    Comment: (NOTE) The Coronavirus on the Respiratory Panel, DOES NOT test for the novel  Coronavirus (  2019 nCoV)    Coronavirus HKU1 NOT DETECTED NOT DETECTED Final   Coronavirus NL63 NOT DETECTED NOT DETECTED Final   Coronavirus OC43 NOT DETECTED NOT DETECTED Final   Metapneumovirus NOT DETECTED NOT DETECTED Final   Rhinovirus / Enterovirus NOT DETECTED NOT DETECTED Final   Influenza A NOT DETECTED NOT DETECTED Final   Influenza B NOT DETECTED NOT DETECTED Final   Parainfluenza Virus 1 NOT DETECTED NOT DETECTED Final   Parainfluenza Virus 2 NOT DETECTED NOT DETECTED Final   Parainfluenza Virus 3 NOT DETECTED NOT DETECTED Final   Parainfluenza Virus 4 NOT DETECTED NOT DETECTED Final   Respiratory Syncytial Virus NOT DETECTED NOT DETECTED Final   Bordetella pertussis NOT DETECTED NOT DETECTED Final   Bordetella Parapertussis NOT DETECTED NOT DETECTED Final   Chlamydophila pneumoniae NOT DETECTED NOT DETECTED Final   Mycoplasma pneumoniae NOT DETECTED NOT DETECTED Final    Comment: Performed at [facility], [place] [address] [place], [place] [address]  Culture, blood (Routine X 2) w Reflex to ID Panel     Status: None (Preliminary result)   Collection Time: 03/13/22  4:16 AM   Specimen: BLOOD  Result Value Ref Range Status   Specimen Description   Final    BLOOD LEFT  ANTECUBITAL Performed at [place] [address], [place], [name] [address]    Special Requests   Final    BOTTLES DRAWN AEROBIC AND ANAEROBIC Blood Culture adequate volume Performed at [place] [address], [place], [place] [address]    Culture   Final    NO GROWTH 4 DAYS Performed at [facility], [place] [address], [place], [place] [address]    Report Status PENDING  Incomplete  Culture, blood (Routine X 2) w Reflex to ID Panel     Status: None (Preliminary result)   Collection Time: 03/13/22  4:17 AM   Specimen: BLOOD  Result Value Ref Range Status   Specimen Description   Final    BLOOD BLOOD RIGHT HAND Performed at [place] [address], [place], [place] [address]    Special Requests   Final    BOTTLES DRAWN AEROBIC AND ANAEROBIC Blood Culture adequate volume Performed at [place] [address] [place], [place] [address]    Culture   Final    NO GROWTH 4 DAYS Performed at [facility], [place] [address] [place], [place] [address]    Report Status PENDING  Incomplete     Labs: Basic Metabolic Panel: Recent Labs  Lab 03/11/22 0356 03/12/22 0642 03/13/22 0417 03/14/22 0848 03/15/22 0513  NA 137 135 136 133* 139  K 4.1 4.2 4.5 4.3 4.5  CL 110 110 110 107 113*  CO2 20* 19* 18* 16* 15*  GLUCOSE 115* 103* 105* 97 85  BUN _0 CREATININE 2.12* 2.04* 2.27* 2.17* 2.05*  CALCIUM 8.0* 7.7* 8.0* 8.0* 8.3*  MG 2.6* 2.4 2.2 2.3  --   PHOS 2.7 2.1*  --   --   --    Liver Function Tests: Recent Labs  Lab 03/11/22 0356 03/12/22 0642 03/13/22 0417 03/14/22 0848  AST 41 31 29 34  ALT _1 ALKPHOS 70 69 71 71  BILITOT 0.7 0.7 0.6 0.7  PROT 6.1* 5.8* 6.2* 6.4*  ALBUMIN 3.0* 2.9* 3.0* 3.0*   CBC: Recent Labs  Lab 03/11/22 0356 03/12/22 0642 03/13/22 0417 03/14/22  6295 03/15/22 0513 03/16/22 0416 03/17/22 0428  WBC 3.4* 3.7* 4.8 6.0  7.6 5.8 5.4  NEUTROABS 2.7 2.6  --   --   --   --   --   HGB 8.4* 7.7* 7.8* 8.1* 8.3* 7.8* 7.7*  HCT 26.1* 23.7* 24.2* 24.8* 26.1* 24.7* 24.9*  MCV 107.9* 105.8* 106.6* 106.0* 107.4* 107.4* 108.7*  PLT 112* 141* 204 233 359 421* 507*   CBG: No results for input(s): "GLUCAP" in the last 168 hours. Hgb A1c No results for input(s): "HGBA1C" in the last 72 hours. Lipid Profile No results for input(s): "CHOL", "HDL", "LDLCALC", "TRIG", "CHOLHDL", "LDLDIRECT" in the last 72 hours. Thyroid function studies No results for input(s): "TSH", "T4TOTAL", "T3FREE", "THYROIDAB" in the last 72 hours.  Invalid input(s): "FREET3" Urinalysis    Component Value Date/Time   COLORURINE YELLOW 03/10/2022 [place] 03/10/2022 0524   LABSPEC 1.010 03/10/2022 0524   PHURINE 6.0 03/10/2022 0524   GLUCOSEU NEGATIVE 03/10/2022 0524   HGBUR SMALL (A) 03/10/2022 0524   BILIRUBINUR NEGATIVE 03/10/2022 0524   KETONESUR NEGATIVE 03/10/2022 0524   PROTEINUR 30 (A) 03/10/2022 0524   UROBILINOGEN 1.0 08/30/2008 1035   NITRITE NEGATIVE 03/10/2022 0524   LEUKOCYTESUR NEGATIVE 03/10/2022 0524    FURTHER DISCHARGE INSTRUCTIONS:   Get Medicines reviewed and adjusted: Please take all your medications with you for your next visit with your Primary MD   Laboratory/radiological data: Please request your Primary MD to go over all hospital tests and procedure/radiological results at the follow up, please ask your Primary MD to get all Hospital records sent to his/her office.   In some cases, they will be blood work, cultures and biopsy results pending at the time of your discharge. Please request that your primary care M.D. goes through all the records of your hospital data and follows up on these results.   Also Note the following: If you experience worsening of your admission symptoms, develop shortness of breath, life threatening emergency, suicidal or homicidal thoughts you must seek medical  attention immediately by calling 911 or calling your MD immediately  if symptoms less severe.   You must read complete instructions/literature along with all the possible adverse reactions/side effects for all the Medicines you take and that have been prescribed to you. Take any new Medicines after you have completely understood and accpet all the possible adverse reactions/side effects.    Do not drive when taking Pain medications or sleeping medications (Benzodaizepines)   Do not take more than prescribed Pain, Sleep and Anxiety Medications. It is not advisable to combine anxiety,sleep and pain medications without talking with your primary care practitioner   Special Instructions: If you have smoked or chewed Tobacco  in the last 2 yrs please stop smoking, stop any regular Alcohol  and or any Recreational drug use.   Wear Seat belts while driving.   Please note: You were cared for by a hospitalist during your hospital stay. Once you are discharged, your primary care physician will handle any further medical issues. Please note that NO REFILLS for any discharge medications will be authorized once you are discharged, as it is imperative that you return to your primary care physician (or establish a relationship with a primary care physician if you do not have one) for your post hospital discharge needs so that they can reassess your need for medications and monitor your lab values.  Time coordinating discharge: 40 minutes  SIGNED:  Marzetta Board, MD, PhD 03/17/2022,  9:52 AM

## 2022-03-17 NOTE — Progress Notes (Addendum)
[facility] Infectious Diseases Follow Up Note  Patient Identification: Patient Name: Darren Hinton. MRN: [id number] Admit Date: 03/09/2022  8:25 PM Age: 76 y.o.Today's Date: 03/17/2022  Reason for Visit: fevers   Principal Problem:   Fever Active Problems:   Atrial fibrillation with RVR (HCC)   SIRS (systemic inflammatory response syndrome) (HCC)   Tachycardia induced cardiomyopathy (HCC)   Pneumonia due to infectious organism  Antibiotics:  Ceftriaxone 7/1-7/8 Azithromycin 7/7-7/8  Cefepime 7/2-7/7   Lines/Hardware:  Interval Events: CT CAP with concerns for liver cyst versus abscess ( less likely) off antibiotics for last 2 days and remains afebrile  Assessment #  fevers, unclear cause-likely viral URTI.  CT CAP 7/7 with concerns for hepatic cyst, liver abscess not entirely excluded but felt less likely.  Small bilateral pleural effusion with compressive and streaky atelectasis in the lower lobes.  Occasional areas of septal thickening can be seen with pulmonary. 7/6 blood cx NG in 4 days   #Multiple myeloma-following oncology outpatient, on CyBorD chemotherapy #Persistent A Fibrillation -that is post cardioversion by cardiology. On AC, amiodarone  # CKD  Recommendations Discussed with patient regarding CT abdomen pelvis findings for concern of liver cyst versus liver abscess( less likely) and fu outpatient to see if repeat imaging is indicated. I have discussed with him to come back to the hospital in case he develops fevers, chills right upper quadrant pain at which time a repeat imaging of the abdomen will be needed and possibly aspiration of hepatic cyst versus abscess.  He is agreeable to the plan and would like to avoid MRI if clinical suspicion for hepatic abscess is low.  A follow-up appointment with [facility] has been made. ID will sign off, please call with questions.  D/w Dr Cruzita Lederer  Rest of the management as per  the primary team. Thank you for the consult. Please page with pertinent questions or concerns.  ______________________________________________________________________ Subjective patient seen and examined at the bedside.  Wife at bedside. No fever, chills Denies nausea, vomiting or diarrhea He feels more energetic and wants to go home  Vitals BP 126/74   Pulse 79   Temp 97.9 F (36.6 C) (Oral)   Resp 16   Ht 6' 5" (1.956 m)   Wt 98 kg   SpO2 99%   BMI 25.61 kg/m     Physical Exam Constitutional: Not in acute distress, sitting up in the bed    Comments:   Cardiovascular:     Rate and Rhythm: Normal rate and regular rhythm.     Heart sounds:   Pulmonary:     Effort: Pulmonary effort is normal.     Comments:   Abdominal:     Palpations: Abdomen is soft.     Tenderness: Nondistended  Musculoskeletal:        General: No swelling or tenderness.   Skin:    Comments: No obvious rashes  Neurological:     General: Awake, alert and oriented.  Grossly nonfocal  Psychiatric:        Mood and Affect: Mood normal.   Pertinent Microbiology Results for orders placed or performed during the hospital encounter of 03/09/22  Culture, blood (Routine x 2)     Status: None   Collection Time: 03/09/22  8:37 PM   Specimen: BLOOD  Result Value Ref Range Status   Specimen Description   Final    BLOOD LEFT ANTECUBITAL Performed at [facility] [address], [place], [place] [address]  Special Requests   Final    BOTTLES DRAWN AEROBIC AND ANAEROBIC Blood Culture results may not be optimal due to an excessive volume of blood received in culture bottles Performed at [facility] [address] [place], [place] [address]    Culture   Final    NO GROWTH 5 DAYS Performed at [facility], [place] [address] [place], [place] [address]    Report Status 03/15/2022 FINAL  Final  Culture, blood (Routine x 2)     Status: None   Collection  Time: 03/09/22  8:40 PM   Specimen: BLOOD  Result Value Ref Range Status   Specimen Description   Final    BLOOD BLOOD LEFT HAND Performed at [facility] [address] [place], [place] [address]    Special Requests   Final    BOTTLES DRAWN AEROBIC AND ANAEROBIC Blood Culture results may not be optimal due to an inadequate volume of blood received in culture bottles Performed at [facility] [address] [place], [place] [address]    Culture   Final    NO GROWTH 5 DAYS Performed at [facility], [place] [address], [place], [place] [address]    Report Status 03/15/2022 FINAL  Final  MRSA Next Gen by PCR, Nasal     Status: None   Collection Time: 03/10/22 12:17 PM   Specimen: Nasal Mucosa; Nasal Swab  Result Value Ref Range Status   MRSA by PCR Next Gen NOT DETECTED NOT DETECTED Final    Comment: (NOTE) The GeneXpert MRSA Assay (FDA approved for NASAL specimens only), is one component of a comprehensive MRSA colonization surveillance program. It is not intended to diagnose MRSA infection nor to guide or monitor treatment for MRSA infections. Test performance is not FDA approved in patients less than 80 years old. Performed at [facility], [place] [address], [place], [place] [address]   Respiratory (~20 pathogens) panel by PCR     Status: None   Collection Time: 03/11/22  1:21 PM   Specimen: Nasopharyngeal Swab; Respiratory  Result Value Ref Range Status   Adenovirus NOT DETECTED NOT DETECTED Final   Coronavirus 229E NOT DETECTED NOT DETECTED Final    Comment: (NOTE) The Coronavirus on the Respiratory Panel, DOES NOT test for the novel  Coronavirus (2019 nCoV)    Coronavirus HKU1 NOT DETECTED NOT DETECTED Final   Coronavirus NL63 NOT DETECTED NOT DETECTED Final   Coronavirus OC43 NOT DETECTED NOT DETECTED Final   Metapneumovirus NOT DETECTED NOT DETECTED Final   Rhinovirus / Enterovirus NOT DETECTED NOT DETECTED  Final   Influenza A NOT DETECTED NOT DETECTED Final   Influenza B NOT DETECTED NOT DETECTED Final   Parainfluenza Virus 1 NOT DETECTED NOT DETECTED Final   Parainfluenza Virus 2 NOT DETECTED NOT DETECTED Final   Parainfluenza Virus 3 NOT DETECTED NOT DETECTED Final   Parainfluenza Virus 4 NOT DETECTED NOT DETECTED Final   Respiratory Syncytial Virus NOT DETECTED NOT DETECTED Final   Bordetella pertussis NOT DETECTED NOT DETECTED Final   Bordetella Parapertussis NOT DETECTED NOT DETECTED Final   Chlamydophila pneumoniae NOT DETECTED NOT DETECTED Final   Mycoplasma pneumoniae NOT DETECTED NOT DETECTED Final    Comment: Performed at [facility], [place]. [address], [place], [place] [address]  Culture, blood (Routine X 2) w Reflex to ID Panel     Status: None (Preliminary result)   Collection Time: 03/13/22  4:16  AM   Specimen: BLOOD  Result Value Ref Range Status   Specimen Description   Final    BLOOD LEFT ANTECUBITAL Performed at [facility] [address], [place], [place] [address]    Special Requests   Final    BOTTLES DRAWN AEROBIC AND ANAEROBIC Blood Culture adequate volume Performed at [facility] [address] [place], [place] [address]    Culture   Final    NO GROWTH 4 DAYS Performed at [facility], [place] [address] [place], [place] [address]    Report Status PENDING  Incomplete  Culture, blood (Routine X 2) w Reflex to ID Panel     Status: None (Preliminary result)   Collection Time: 03/13/22  4:17 AM   Specimen: BLOOD  Result Value Ref Range Status   Specimen Description   Final    BLOOD BLOOD RIGHT HAND Performed at [facility] [address], [place], [place] [address]    Special Requests   Final    BOTTLES DRAWN AEROBIC AND ANAEROBIC Blood Culture adequate volume Performed at [facility] [address] [place], [place] [address]    Culture   Final    NO GROWTH 4  DAYS Performed at [facility], [place] [address] [place], [place] [address]    Report Status PENDING  Incomplete    Pertinent Lab.    Latest Ref Rng & Units 03/17/2022    4:28 AM 03/16/2022    4:16 AM 03/15/2022    5:13 AM  CBC  WBC 4.0 - 10.5 K/uL 5.4  5.8  7.6   Hemoglobin 13.0 - 17.0 g/dL 7.7  7.8  8.3   Hematocrit 39.0 - 52.0 % 24.9  24.7  26.1   Platelets 150 - 400 K/uL 507  421  359       Latest Ref Rng & Units 03/15/2022    5:13 AM 03/14/2022    8:48 AM 03/13/2022    4:17 AM  CMP  Glucose 70 - 99 mg/dL 85  97  105   BUN 8 - 23 mg/dL _0 Creatinine 0.61 - 1.24 mg/dL 2.05  2.17  2.27   Sodium 135 - 145 mmol/L 139  133  136   Potassium 3.5 - 5.1 mmol/L 4.5  4.3  4.5   Chloride 98 - 111 mmol/L 113  107  110   CO2 22 - 32 mmol/L _1 Calcium 8.9 - 10.3 mg/dL 8.3  8.0  8.0   Total Protein 6.5 - 8.1 g/dL  6.4  6.2   Total Bilirubin 0.3 - 1.2 mg/dL  0.7  0.6   Alkaline Phos 38 - 126 U/L  71  71   AST 15 - 41 U/L  34  29   ALT 0 - 44 U/L  31  25      Pertinent Imaging today Plain films and CT images have been personally visualized and interpreted; radiology reports have been reviewed. Decision making incorporated into the Impression / Recommendations. CT CHEST ABDOMEN PELVIS WO CONTRAST  Result Date: 03/14/2022 CLINICAL DATA:  Sepsis r/o occult abscess History of multiple myeloma. EXAM: CT CHEST, ABDOMEN AND PELVIS WITHOUT CONTRAST TECHNIQUE: Multidetector CT imaging of the chest, abdomen and pelvis was performed following the standard protocol without IV contrast. RADIATION DOSE REDUCTION: This exam was performed according to the departmental dose-optimization program which includes automated exposure control,  adjustment of the mA and/or kV according to patient size and/or use of iterative reconstruction technique. COMPARISON:  Chest radiograph 03/12/2022. FINDINGS: CT CHEST FINDINGS Cardiovascular: Heart size upper normal. Tortuous thoracic aorta. Coronary artery  calcifications. Trace pericardial effusion. Mediastinum/Nodes: Scattered small mediastinal lymph nodes not enlarged by size criteria. Small amount of enteric contrast within the mid esophagus. No associated wall thickening. No thyroid nodule. Lungs/Pleura: There are small bilateral pleural effusions and atelectasis in the lower lobes. Streaky opacities within both lower lobes also favor atelectasis. No pulmonary mass. No endobronchial debris or lesion. Occasional areas of septal thickening. Minimal apical predominant emphysema. Musculoskeletal: There are multiple lytic lesions consistent with history of multiple myeloma. Largest lesions are in the sternal manubrium and left scapula. There are multiple lesions throughout the thoracic spine. No pathologic compression fracture. No evidence of epidural tumor extension. Chest wall soft tissues are unremarkable. CT ABDOMEN PELVIS FINDINGS Hepatobiliary: 14 mm water density lesion in the left hepatic lobe series 2, image 55. Additional subcentimeter hypodensity in the right and left hepatic lobe Gallbladder physiologically distended, no calcified stone. No biliary dilatation. Pancreas: No ductal dilatation or inflammation. Spleen: Normal in size without focal abnormality. Adrenals/Urinary Tract: Normal adrenal glands. Lobulated bilateral renal contours suggest scarring. No significant perinephric edema. No renal calculi. 11 mm cyst in the lower right kidney, needs no further follow-up. No evidence of perinephric collection. Unremarkable urinary bladder. Stomach/Bowel: Small duodenal diverticulum stomach is unremarkable. No bowel obstruction or inflammation. Normal appendix. Minimal sigmoid diverticulosis. No diverticulitis or acute colonic inflammation. Vascular/Lymphatic: Mild aorto bi-iliac atherosclerosis. No abdominopelvic adenopathy. Reproductive: Prostate is unremarkable. Other: No free air. No free fluid. No abdominopelvic collection. Slight subcutaneous edema in  the flanks. Small bilateral fat containing inguinal hernias. Musculoskeletal: Multiple lytic lesions in the lumbar spine consistent with known myeloma. No pathologic compression fracture. Scattered lytic lesions in the pelvis. No evident lesion in the proximal for more to place the patient at risk of pathologic femur fracture. IMPRESSION: 1. Three hypodense liver lesions measures simple fluid density are likely cysts, although there are no prior exams to establish stability. The largest measures 14 mm, 2 additional lesions are subcentimeter. The possibility of hepatic abscess is not entirely excluded on this unenhanced exam, but felt less likely. Consider further assessment with hepatic MRI as clinically indicated. 2. Small bilateral pleural effusions with compressive and streaky atelectasis in the lower lobes. Occasional areas of septal thickening, can be seen with pulmonary edema. 3. Innumerable lytic lesions throughout the skeleton consistent with known myeloma. No evidence of pathologic fracture. 4. Mild colonic diverticulosis without diverticulitis. Aortic Atherosclerosis (ICD10-I70.0) and Emphysema (ICD10-J43.9). Electronically Signed   By: Keith Rake M.D.   On: 03/14/2022 22:11   ECHO TEE  Result Date: 03/14/2022    TRANSESOPHOGEAL ECHO REPORT   Patient Name:   Eriverto Byrnes. Date of Exam: 03/14/2022 Medical Rec #:  [id number]           Height:       77.0 in Accession #:    [id number]          Weight:       217.6 lb Date of Birth:  [birth year]          BSA:          2.318 m Patient Age:    41 years            BP:           111/81 mmHg Patient Gender: M  HR:           90 bpm. Exam Location:  Inpatient Procedure: Transesophageal Echo, Cardiac Doppler and Color Doppler Indications:     A flutter  History:         Patient has prior history of Echocardiogram examinations, most                  recent 03/12/2022. Mitral Valve Disease, Arrythmias:Atrial                  Fibrillation; Risk  Factors:Dyslipidemia and Hypertension.                  Hypothyroidism.  Sonographer:     Eartha Inch Referring Phys:  [id number] HAO MENG Diagnosing Phys: Lyman Bishop MD PROCEDURE: After discussion of the risks and benefits of a TEE, an informed consent was obtained from the patient. TEE procedure time was 8 minutes. The transesophogeal probe was passed without difficulty through the esophogus of the patient. Imaged were  obtained with the patient in a supine position. Sedation performed by different physician. Image quality was good. The patient developed no complications during the procedure. 1. Cardioverted 4 time(s). 2. Cardioverted at 120J, 150J and 200J x 2 biphasic. Converted to sinus after the initial shock, then back in afib- converted to sinus again for a few seconds with 200J, then back in afib. IMPRESSIONS  1. Left ventricular ejection fraction, by estimation, is 50 to 55%. The left ventricle has low normal function. There is mild left ventricular hypertrophy.  2. Right ventricular systolic function is normal. The right ventricular size is normal.  3. No left atrial/left atrial appendage thrombus was detected.  4. The mitral valve is grossly normal. Mild mitral valve regurgitation.  5. The aortic valve is tricuspid. Aortic valve regurgitation is not visualized.  6. 1. Cardioverted 4 time(s).     2. Cardioverted at 120J, 150J and 200J x 2 biphasic. Converted to sinus after the initial shock, then back in afib- converted to sinus again for a few seconds with 200J, then back in afib. Conclusion(s)/Recommendation(s): No evidence of vegetation/infective endocarditis on this transesophageael echocardiogram. No LA/LAA thrombus identified. Unsuccessful cardioversion performed without restoration of normal sinus rhythm. FINDINGS  Left Ventricle: Left ventricular ejection fraction, by estimation, is 50 to 55%. The left ventricle has low normal function. The left ventricular internal cavity size was normal in  size. There is mild left ventricular hypertrophy. Right Ventricle: The right ventricular size is normal. No increase in right ventricular wall thickness. Right ventricular systolic function is normal. Left Atrium: Left atrial size was normal in size. No left atrial/left atrial appendage thrombus was detected. Right Atrium: Right atrial size was normal in size. Pericardium: There is no evidence of pericardial effusion. Mitral Valve: The mitral valve is grossly normal. Mild mitral valve regurgitation, with multiple jets. Tricuspid Valve: The tricuspid valve is grossly normal. Tricuspid valve regurgitation is trivial. Aortic Valve: The aortic valve is tricuspid. Aortic valve regurgitation is not visualized. Pulmonic Valve: The pulmonic valve was normal in structure. Pulmonic valve regurgitation is trivial. Aorta: The aortic root and ascending aorta are structurally normal, with no evidence of dilitation. IAS/Shunts: No atrial level shunt detected by color flow Doppler.   AORTA Ao Asc diam: 3.90 cm Lyman Bishop MD Electronically signed by Lyman Bishop MD Signature Date/Time: 03/14/2022/11:53:24 AM    Final (Updated)    ECHOCARDIOGRAM COMPLETE  Result Date: 03/12/2022    ECHOCARDIOGRAM REPORT   Patient Name:  [name]. Date of Exam: 03/12/2022 Medical Rec #:  [id number]           Height:       77.0 in Accession #:    [id number]          Weight:       217.6 lb Date of Birth:  [birth year]          BSA:          2.318 m Patient Age:    29 years            BP:           108/84 mmHg Patient Gender: M                   HR:           128 bpm. Exam Location:  Inpatient Procedure: 2D Echo, Cardiac Doppler, Color Doppler and Intracardiac            Opacification Agent Indications:    Atrial Flutter I48.92  History:        Patient has no prior history of Echocardiogram examinations.                 Risk Factors:Hypertension and Dyslipidemia. Hypothyroidism.                 Fever due to pneumonia. Chronic kidney disease,  anemia.  Sonographer:    Darlina Sicilian RDCS Referring Phys: [id number] [place]  1. Left ventricular ejection fraction, by estimation, is 40 to 45%. The left ventricle has mildly decreased function. The left ventricle has no regional wall motion abnormalities. There is mild concentric left ventricular hypertrophy. Left ventricular diastolic parameters are consistent with Grade I diastolic dysfunction (impaired relaxation).  2. Right ventricular systolic function is normal. The right ventricular size is normal. There is mildly elevated pulmonary artery systolic pressure.  3. The mitral valve is normal in structure. Mild mitral valve regurgitation. No evidence of mitral stenosis.  4. The aortic valve is tricuspid. Aortic valve regurgitation is trivial. Aortic valve sclerosis is present, with no evidence of aortic valve stenosis.  5. The inferior vena cava is normal in size with greater than 50% respiratory variability, suggesting right atrial pressure of 3 mmHg. FINDINGS  Left Ventricle: Left ventricular ejection fraction, by estimation, is 40 to 45%. The left ventricle has mildly decreased function. The left ventricle has no regional wall motion abnormalities. Definity contrast agent was given IV to delineate the left ventricular endocardial borders. The left ventricular internal cavity size was normal in size. There is mild concentric left ventricular hypertrophy. Left ventricular diastolic parameters are consistent with Grade I diastolic dysfunction (impaired relaxation). Right Ventricle: The right ventricular size is normal. No increase in right ventricular wall thickness. Right ventricular systolic function is normal. There is mildly elevated pulmonary artery systolic pressure. The tricuspid regurgitant velocity is 2.66  m/s, and with an assumed right atrial pressure of 8 mmHg, the estimated right ventricular systolic pressure is 57.9 mmHg. Left Atrium: Left atrial size was normal in size.  Right Atrium: Right atrial size was normal in size. Pericardium: There is no evidence of pericardial effusion. Mitral Valve: The mitral valve is normal in structure. Mild mitral valve regurgitation. No evidence of mitral valve stenosis. Tricuspid Valve: The tricuspid valve is normal in structure. Tricuspid valve regurgitation is not demonstrated. No evidence of tricuspid stenosis. Aortic Valve: The aortic valve is tricuspid. Aortic valve regurgitation is trivial.  Aortic valve sclerosis is present, with no evidence of aortic valve stenosis. Pulmonic Valve: The pulmonic valve was normal in structure. Pulmonic valve regurgitation is not visualized. No evidence of pulmonic stenosis. Aorta: The aortic root is normal in size and structure. Venous: The inferior vena cava is normal in size with greater than 50% respiratory variability, suggesting right atrial pressure of 3 mmHg. IAS/Shunts: No atrial level shunt detected by color flow Doppler.  LEFT VENTRICLE PLAX 2D LVIDd:         4.60 cm      Diastology LVIDs:         3.30 cm      LV e' medial:    7.94 cm/s LV PW:         1.20 cm      LV E/e' medial:  13.1 LV IVS:        1.30 cm      LV e' lateral:   12.85 cm/s LVOT diam:     2.10 cm      LV E/e' lateral: 8.1 LV SV:         38 LV SV Index:   16 LVOT Area:     3.46 cm  LV Volumes (MOD) LV vol d, MOD A2C: 114.0 ml LV vol d, MOD A4C: 117.0 ml LV vol s, MOD A2C: 61.9 ml LV vol s, MOD A4C: 54.5 ml LV SV MOD A2C:     52.1 ml LV SV MOD A4C:     117.0 ml LV SV MOD BP:      54.9 ml RIGHT VENTRICLE RV S prime:     14.80 cm/s LEFT ATRIUM             Index        RIGHT ATRIUM           Index LA diam:        4.10 cm 1.77 cm/m   RA Area:     13.20 cm LA Vol (A2C):   63.1 ml 27.22 ml/m  RA Volume:   31.60 ml  13.63 ml/m LA Vol (A4C):   70.4 ml 30.37 ml/m LA Biplane Vol: 72.2 ml 31.14 ml/m  AORTIC VALVE LVOT Vmax:   80.00 cm/s LVOT Vmean:  53.600 cm/s LVOT VTI:    0.109 m  AORTA Ao Root diam: 3.50 cm Ao Asc diam:  3.00 cm MITRAL  VALVE                TRICUSPID VALVE MV Area (PHT): 4.19 cm     TR Peak grad:   28.3 mmHg MV Decel Time: 181 msec     TR Vmax:        266.00 cm/s MV E velocity: 103.75 cm/s                             SHUNTS                             Systemic VTI:  0.11 m                             Systemic Diam: 2.10 cm Kardie Tobb DO Electronically signed by Berniece Salines DO Signature Date/Time: 03/12/2022/2:19:52 PM    Final    DG CHEST PORT 1 VIEW  Result Date: 03/12/2022 CLINICAL DATA:  Shortness of breath. EXAM: PORTABLE  CHEST 1 VIEW COMPARISON:  03/11/2022. FINDINGS: Similar mild interstitial and patchy airspace opacities in the left lung. Similar streaky opacities at the right lung base. No visible pleural effusions or pneumothorax. No acute osseous abnormality. Polyarticular degenerative change. IMPRESSION: 1. Similar mild interstitial and patchy airspace opacities in the left lung which could represent asymmetric edema or infection. 2. Similar mild streaky right basilar opacities, which could represent atelectasis and/or pneumonia. Electronically Signed   By: Margaretha Sheffield M.D.   On: 03/12/2022 08:08   VAS [place] LOWER EXTREMITY VENOUS (DVT)  Result Date: 03/12/2022  Lower Venous DVT Study Patient Name:  Ezekeil Bethel.  Date of Exam:   03/11/2022 Medical Rec #: [id number]            Accession #:    [id number] Date of Birth: Jun 21, 1946           Patient Gender: M Patient Age:   66 years Exam Location:  [facility] Procedure:      VAS [place] LOWER EXTREMITY VENOUS (DVT) Referring Phys: Raiford Noble --------------------------------------------------------------------------------  Indications: Swelling.  Comparison Study: No prior studies. Performing Technologist: Darlin Coco RDMS, RVT  Examination Guidelines: A complete evaluation includes B-mode imaging, spectral Doppler, color Doppler, and power Doppler as needed of all accessible portions of each vessel. Bilateral testing is considered an integral part of a  complete examination. Limited examinations for reoccurring indications may be performed as noted. The reflux portion of the exam is performed with the patient in reverse Trendelenburg.  +---------+---------------+---------+-----------+----------+--------------+ RIGHT    CompressibilityPhasicitySpontaneityPropertiesThrombus Aging +---------+---------------+---------+-----------+----------+--------------+ CFV      Full           Yes      Yes                                 +---------+---------------+---------+-----------+----------+--------------+ SFJ      Full                                                        +---------+---------------+---------+-----------+----------+--------------+ FV Prox  Full                                                        +---------+---------------+---------+-----------+----------+--------------+ FV Mid   Full                                                        +---------+---------------+---------+-----------+----------+--------------+ FV DistalFull                                                        +---------+---------------+---------+-----------+----------+--------------+ PFV      Full                                                        +---------+---------------+---------+-----------+----------+--------------+  POP      Full           Yes      Yes                                 +---------+---------------+---------+-----------+----------+--------------+ PTV      Full                                                        +---------+---------------+---------+-----------+----------+--------------+ PERO     Full                                                        +---------+---------------+---------+-----------+----------+--------------+   +---------+---------------+---------+-----------+----------+--------------+ LEFT     CompressibilityPhasicitySpontaneityPropertiesThrombus Aging  +---------+---------------+---------+-----------+----------+--------------+ CFV      Full           Yes      Yes                                 +---------+---------------+---------+-----------+----------+--------------+ SFJ      Full                                                        +---------+---------------+---------+-----------+----------+--------------+ FV Prox  Full                                                        +---------+---------------+---------+-----------+----------+--------------+ FV Mid   Full                                                        +---------+---------------+---------+-----------+----------+--------------+ FV DistalFull                                                        +---------+---------------+---------+-----------+----------+--------------+ PFV      Full                                                        +---------+---------------+---------+-----------+----------+--------------+ POP      Full           Yes      Yes                                 +---------+---------------+---------+-----------+----------+--------------+  PTV      Full                                                        +---------+---------------+---------+-----------+----------+--------------+ PERO     Full                                                        +---------+---------------+---------+-----------+----------+--------------+     Summary: RIGHT: - There is no evidence of deep vein thrombosis in the lower extremity.  - No cystic structure found in the popliteal fossa.  LEFT: - There is no evidence of deep vein thrombosis in the lower extremity.  - No cystic structure found in the popliteal fossa.  *See table(s) above for measurements and observations. Electronically signed by Jamelle Haring on 03/12/2022 at 2:07:01 AM.    Final    DG CHEST PORT 1 VIEW  Result Date: 03/11/2022 CLINICAL DATA:  Shortness of breath EXAM:  PORTABLE CHEST 1 VIEW COMPARISON:  03/08/2022 FINDINGS: Heart size within normal limits. Interval development of interstitial opacities within the left mid to lower lung. Elevation of the right hemidiaphragm with streaky right basilar opacity. No pleural effusion or pneumothorax. IMPRESSION: 1. Interval development of interstitial opacities within the left mid to lower lung concerning for atypical/viral infection. 2. Streaky right basilar opacity may represent atelectasis versus developing infection. Electronically Signed   By: Davina Poke D.O.   On: 03/11/2022 10:37   DG Chest Port 1 View  Result Date: 03/08/2022 CLINICAL DATA:  Questionable sepsis - evaluate for abnormality Fever.  Chemotherapy, most recently Monday. EXAM: PORTABLE CHEST 1 VIEW COMPARISON:  Chest radiograph 03/12/2014 FINDINGS: The heart is normal in size. Streaky opacity at the right lung base is unchanged from prior exam and most consistent with scarring. No evidence of acute airspace disease. No pulmonary edema, pleural effusion, or pneumothorax. No acute osseous abnormalities are seen on this portable exam, patient with history of myeloma. IMPRESSION: No acute chest findings. Unchanged scarring at the right lung base. Electronically Signed   By: Keith Rake M.D.   On: 03/08/2022 23:45   CT BIOPSY  Result Date: 01/16/2022 CLINICAL DATA:  History of monoclonal gammopathy and concern for potential progression to multiple myeloma. Bone marrow biopsy requested for further evaluation. EXAM: CT GUIDED BONE MARROW ASPIRATION AND BIOPSY ANESTHESIA/SEDATION: Moderate (conscious) sedation was employed during this procedure. A total of Versed 2.0 mg and Fentanyl 100 mcg was administered intravenously by radiology nursing. Moderate Sedation Time: 10 minutes. The patient's level of consciousness and vital signs were monitored continuously by radiology nursing throughout the procedure under my direct supervision. PROCEDURE: The procedure  risks, benefits, and alternatives were explained to the patient. Questions regarding the procedure were encouraged and answered. The patient understands and consents to the procedure. A time out was performed prior to initiating the procedure. The right gluteal region was prepped with chlorhexidine. Sterile gown and sterile gloves were used for the procedure. Local anesthesia was provided with 1% Lidocaine. Under CT guidance, an 11 gauge On Control bone cutting needle was advanced from a posterior approach into the right iliac bone. Needle positioning was confirmed with CT. Initial  non heparinized and heparinized aspirate samples were obtained of bone marrow. Core biopsy was performed via the On Control drill needle. COMPLICATIONS: None FINDINGS: Inspection of initial aspirate did reveal visible particles. Intact core biopsy sample was obtained. IMPRESSION: CT guided bone marrow biopsy of right posterior iliac bone with both aspirate and core samples obtained. Electronically Signed   By: Aletta Edouard M.D.   On: 01/16/2022 11:39   CT BONE MARROW BIOPSY  Result Date: 01/16/2022 CLINICAL DATA:  History of monoclonal gammopathy and concern for potential progression to multiple myeloma. Bone marrow biopsy requested for further evaluation. EXAM: CT GUIDED BONE MARROW ASPIRATION AND BIOPSY ANESTHESIA/SEDATION: Moderate (conscious) sedation was employed during this procedure. A total of Versed 2.0 mg and Fentanyl 100 mcg was administered intravenously by radiology nursing. Moderate Sedation Time: 10 minutes. The patient's level of consciousness and vital signs were monitored continuously by radiology nursing throughout the procedure under my direct supervision. PROCEDURE: The procedure risks, benefits, and alternatives were explained to the patient. Questions regarding the procedure were encouraged and answered. The patient understands and consents to the procedure. A time out was performed prior to initiating the  procedure. The right gluteal region was prepped with chlorhexidine. Sterile gown and sterile gloves were used for the procedure. Local anesthesia was provided with 1% Lidocaine. Under CT guidance, an 11 gauge On Control bone cutting needle was advanced from a posterior approach into the right iliac bone. Needle positioning was confirmed with CT. Initial non heparinized and heparinized aspirate samples were obtained of bone marrow. Core biopsy was performed via the On Control drill needle. COMPLICATIONS: None FINDINGS: Inspection of initial aspirate did reveal visible particles. Intact core biopsy sample was obtained. IMPRESSION: CT guided bone marrow biopsy of right posterior iliac bone with both aspirate and core samples obtained. Electronically Signed   By: Aletta Edouard M.D.   On: 01/16/2022 11:39   US RENAL  Result Date: 12/31/2021 CLINICAL DATA:  Acute renal failure. Hypertension. Monoclonal gammopathy. EXAM: RENAL / URINARY TRACT ULTRASOUND COMPLETE COMPARISON:  No prior. FINDINGS: Right Kidney: Renal measurements: 9.2 x 5.4 x 5.3 cm = volume: 138.3 mL. Lobulated contour. Increased echogenicity. No mass or hydronephrosis visualized. Left Kidney: Renal measurements: 12.1 x 6.1 x 5.8 cm = volume: 223.4 mL. Lobulated contour. Increased echogenicity. No mass or hydronephrosis visualized. Bladder: Appears normal for degree of bladder distention. Other: None. IMPRESSION: Bilateral lobulated renal contour and increased renal echogenicity consistent with renal scarring and chronic medical renal disease. No acute abnormality. No hydronephrosis or bladder distention. Electronically Signed   By: Marcello Moores  Register M.D.   On: 12/31/2021 14:38     I spent more 50 minutes for this patient encounter including review of prior medical records, coordination of care with primary/other specialist with greater than 50% of time being face to face/counseling and discussing diagnostics/treatment plan with the  patient/family.  Electronically signed by:   Rosiland Oz, MD Infectious Disease Physician [facility] Pager: [phone]

## 2022-03-18 LAB — CULTURE, BLOOD (ROUTINE X 2)
Culture: NO GROWTH
Culture: NO GROWTH
Special Requests: ADEQUATE
Special Requests: ADEQUATE

## 2022-03-23 NOTE — Progress Notes (Unsigned)
[place] [phone]   [phone]  PROGRESS NOTE  Patient Care Team: [name], MD as PCP - General (Internal Medicine)  Hematological/Oncological History # IgA Lambda Multiple Myeloma 05/03/2015: last visit with Dr. Julien Nordmann at the [facility]. Was followed for IgA lambda MGUS. 12/11/2021: labs show M protein 3.4, Kappa 19.2, Lambda 1576.4, ratio 0.01. Cr 3.47, Hgb 8.6, WBC 5.7, MCV 99, Plt 221 12/23/2021: establish care with Dr. [name]  01/16/2022: Bone marrow biopsy performed, showed a 60% cellular bone marrow predominantly comprised of plasma cells making up 70 to 80%, lambda restricted.  Myeloma FISH panel showed no evidence of abnormalities. 02/07/2022: Cycle 1 Day 1 of CyBorD chemotherapy.  03/09/2022-03/17/2022: hospitalized for fever/pneumonia.  03/24/2022: Cycle 2 Day 1 of CyBorD chemotherapy.   Interval History:  Darren Hinton. 76 y.o. male with medical history significant for IgA lambda multiple myeloma who presents for a follow up visit. The patient's last visit was on 02/24/2022. In the interim since the last visit he was hospitalized from 03/09/2022-03/17/2022 for pneumonia.   On exam today Darren Hinton is accompanied by his wife.  He reports he is feeling much better after his discharge from the hospital.  He reports that he is not having any further fevers.  He notes that he is "on the road recovery".  His legs continue to be a little bit wobbly and he feels like he is only about to 50% of his prior strength.  He reports that he does have diarrhea typically on the Tuesday after his Velcade shot.  He has Imodium on hand which has been effective in slowing down his diarrhea.  He is also been drinking Gatorade and Pedialyte to keep up with his hydration when this occurs.  He is not having any numbness or tingling of his fingers and toes.  He has lost about 3 pounds of weight in the interim since her last visit.  He reports that he is not  having any issues with fevers, chills, sweats, nausea, vomiting or constipation.  A full 10 point ROS is otherwise negative.  MEDICAL HISTORY:  Past Medical History:  Diagnosis Date   Allergy    Anxiety    Arthritis    Depression    Heart murmur    Hyperlipidemia    Hyperplastic colon polyp    Hyperproteinemia    Hypertension    Hypothyroidism    Mitral valve prolapse    Paroxysmal atrial fibrillation (HCC)    Thyroid disease     SURGICAL HISTORY: Past Surgical History:  Procedure Laterality Date   CARDIOVERSION N/A 03/14/2022   Procedure: CARDIOVERSION;  Surgeon: Pixie Casino, MD;  Location: [facility];  Service: Cardiovascular;  Laterality: N/A;   TEE WITHOUT CARDIOVERSION N/A 03/14/2022   Procedure: TRANSESOPHAGEAL ECHOCARDIOGRAM (TEE);  Surgeon: Pixie Casino, MD;  Location: [facility] ENDOSCOPY;  Service: Cardiovascular;  Laterality: N/A;    SOCIAL HISTORY: Social History   Socioeconomic History   Marital status: Married    Spouse name: Not on file   Number of children: Not on file   Years of education: Not on file   Highest education level: Not on file  Occupational History   Not on file  Tobacco Use   Smoking status: Former    Years: 20.00    Types: Cigarettes    Quit date: 10/01/1978    Years since quitting: 43.5   Smokeless tobacco: Never  Substance and Sexual Activity   Alcohol use: Yes  Alcohol/week: 2.0 standard drinks of alcohol    Types: 2 Glasses of wine per week    Comment: occasionally   Drug use: No   Sexual activity: Not on file  Other Topics Concern   Not on file  Social History Narrative   Not on file   Social Determinants of Health   Financial Resource Strain: Not on file  Food Insecurity: Not on file  Transportation Needs: Not on file  Physical Activity: Not on file  Stress: Not on file  Social Connections: Not on file  Intimate Partner Violence: Not on file    FAMILY HISTORY: Family History  Problem Relation Age of Onset    Cancer Father    Colon cancer Neg Hx    Esophageal cancer Neg Hx    Liver cancer Neg Hx    Pancreatic cancer Neg Hx    Rectal cancer Neg Hx    Stomach cancer Neg Hx     ALLERGIES:  is allergic to zithromax [azithromycin].  MEDICATIONS:  Current Outpatient Medications  Medication Sig Dispense Refill   acyclovir (ZOVIRAX) 400 MG tablet Take 1 tablet (400 mg total) by mouth 2 (two) times daily. 180 tablet 1   allopurinol (ZYLOPRIM) 300 MG tablet Take 1 tablet (300 mg total) by mouth daily. 90 tablet 1   ALPRAZolam (XANAX) 0.25 MG tablet Take 0.25 mg by mouth daily as needed. Anxiety      amiodarone (PACERONE) 200 MG tablet Take 1 tablet (200 mg total) by mouth daily. 30 tablet 0   amiodarone (PACERONE) 200 MG tablet Take 1 tablet (200 mg total) by mouth 2 (two) times daily for 4 days. 8 tablet 0   apixaban (ELIQUIS) 5 MG TABS tablet Take 1 tablet (5 mg total) by mouth 2 (two) times daily. 60 tablet 0   atorvastatin (LIPITOR) 20 MG tablet Take 20 mg by mouth daily.     dexamethasone (DECADRON) 4 MG tablet Take 10 tablets (40 mg total) by mouth once a week. Take steroids pills the in the morning on chemotherapy days. 120 tablet 1   ergocalciferol (VITAMIN D2) 1.25 MG (50000 UT) capsule Take 50,000 Units by mouth every Wednesday.     ferrous gluconate (FERGON) 324 MG tablet Take 1 tablet (324 mg total) by mouth 3 (three) times daily with meals. 90 tablet 0   fluticasone (FLONASE) 50 MCG/ACT nasal spray Place 2 sprays into the nose daily as needed. Allergies      levothyroxine (SYNTHROID) 100 MCG tablet Take 100 mcg by mouth daily before breakfast.     loratadine (CLARITIN) 10 MG tablet Take 10 mg by mouth daily.     meclizine (ANTIVERT) 25 MG tablet Take 1 tablet (25 mg total) by mouth 3 (three) times daily as needed for dizziness. 30 tablet 0   metoprolol succinate (TOPROL-XL) 50 MG 24 hr tablet Take 1 tablet (50 mg total) by mouth daily. Take with or immediately following a meal. 30 tablet  0   ondansetron (ZOFRAN) 8 MG tablet Take 1 tablet (8 mg total) by mouth every 8 (eight) hours as needed. 30 tablet 0   prochlorperazine (COMPAZINE) 10 MG tablet Take 1 tablet (10 mg total) by mouth every 6 (six) hours as needed for nausea or vomiting. 30 tablet 0   senna-docusate (SENOKOT-S) 8.6-50 MG tablet Take 1 tablet by mouth at bedtime as needed for mild constipation.     vitamin B-12 (CYANOCOBALAMIN) 500 MCG tablet Take 1 tablet by mouth daily.  Current Facility-Administered Medications  Medication Dose Route Frequency Provider Last Rate Last Admin   0.9 %  sodium chloride infusion  500 mL Intravenous Once Armbruster, Carlota Raspberry, MD       Facility-Administered Medications Ordered in Other Visits  Medication Dose Route Frequency Provider Last Rate Last Admin   cyclophosphamide (CYTOXAN) 700 mg in sodium chloride 0.9 % 250 mL chemo infusion  300 mg/m2 (Order-Specific) Intravenous Once Orson Slick, MD        REVIEW OF SYSTEMS:   Constitutional: ( - ) fevers, ( - )  chills , ( - ) night sweats Eyes: ( - ) blurriness of vision, ( - ) double vision, ( - ) watery eyes Ears, nose, mouth, throat, and face: ( - ) mucositis, ( - ) sore throat Respiratory: ( - ) cough, ( - ) dyspnea, ( - ) wheezes Cardiovascular: ( - ) palpitation, ( - ) chest discomfort, ( - ) lower extremity swelling Gastrointestinal:  ( - ) nausea, ( - ) heartburn, ( - ) change in bowel habits Skin: ( - ) abnormal skin rashes Lymphatics: ( - ) new lymphadenopathy, ( - ) easy bruising Neurological: ( - ) numbness, ( - ) tingling, ( - ) new weaknesses Behavioral/Psych: ( - ) mood change, ( - ) new changes  All other systems were reviewed with the patient and are negative.  PHYSICAL EXAMINATION: ECOG PERFORMANCE STATUS: 1 - Symptomatic but completely ambulatory  Vitals:   03/24/22 1025  BP: 136/81  Pulse: 98  Resp: 17  Temp: 97.7 F (36.5 C)  SpO2: 99%    Filed Weights   03/24/22 1025  Weight: 215 lb 4.8  oz (97.7 kg)     GENERAL: Chronically ill-appearing elderly African-American male, alert, no distress and comfortable SKIN: skin color, texture, turgor are normal, no rashes or significant lesions EYES: conjunctiva are pink and non-injected, sclera clear LUNGS: clear to auscultation and percussion with normal breathing effort HEART: regular rate & rhythm and no murmurs and no lower extremity edema ABDOMEN: soft, non-tender, non-distended, normal bowel sounds Musculoskeletal: no cyanosis of digits and no clubbing  PSYCH: alert & oriented x 3, fluent speech NEURO: no focal motor/sensory deficits  LABORATORY DATA:  I have reviewed the data as listed    Latest Ref Rng & Units 03/24/2022   10:15 AM 03/17/2022    4:28 AM 03/16/2022    4:16 AM  CBC  WBC 4.0 - 10.5 K/uL 5.0  5.4  5.8   Hemoglobin 13.0 - 17.0 g/dL 8.5  7.7  7.8   Hematocrit 39.0 - 52.0 % 27.4  24.9  24.7   Platelets 150 - 400 K/uL 397  507  421        Latest Ref Rng & Units 03/24/2022   10:15 AM 03/15/2022    5:13 AM 03/14/2022    8:48 AM  CMP  Glucose 70 - 99 mg/dL 122  85  97   BUN 8 - 23 mg/dL $Remove'11  17  18   'YGwqPqb$ Creatinine 0.61 - 1.24 mg/dL 2.03  2.05  2.17   Sodium 135 - 145 mmol/L 138  139  133   Potassium 3.5 - 5.1 mmol/L 4.2  4.5  4.3   Chloride 98 - 111 mmol/L 107  113  107   CO2 22 - 32 mmol/L $RemoveB'24  15  16   'OwjDLZtk$ Calcium 8.9 - 10.3 mg/dL 8.6  8.3  8.0   Total Protein 6.5 - 8.1 g/dL 6.7  6.4   Total Bilirubin 0.3 - 1.2 mg/dL 0.4   0.7   Alkaline Phos 38 - 126 U/L 71   71   AST 15 - 41 U/L 30   34   ALT 0 - 44 U/L 30   31     Lab Results  Component Value Date   MPROTEIN 0.7 (H) 03/03/2022   MPROTEIN 2.4 (H) 02/07/2022   MPROTEIN 3.1 (H) 01/23/2022   Lab Results  Component Value Date   KPAFRELGTCHN 14.1 03/03/2022   KPAFRELGTCHN 15.9 02/07/2022   KPAFRELGTCHN 17.0 01/23/2022   LAMBDASER 15.8 03/03/2022   LAMBDASER 1,590.2 (H) 02/07/2022   LAMBDASER 1,228.3 (H) 01/23/2022   KAPLAMBRATIO 0.89 03/03/2022    KAPLAMBRATIO 0.01 (L) 02/07/2022   KAPLAMBRATIO 0.01 (L) 01/23/2022   KAPLAMBRATIO 0.01 (L) 01/23/2022    RADIOGRAPHIC STUDIES: CT CHEST ABDOMEN PELVIS WO CONTRAST  Result Date: 03/14/2022 CLINICAL DATA:  Sepsis r/o occult abscess History of multiple myeloma. EXAM: CT CHEST, ABDOMEN AND PELVIS WITHOUT CONTRAST TECHNIQUE: Multidetector CT imaging of the chest, abdomen and pelvis was performed following the standard protocol without IV contrast. RADIATION DOSE REDUCTION: This exam was performed according to the departmental dose-optimization program which includes automated exposure control, adjustment of the mA and/or kV according to patient size and/or use of iterative reconstruction technique. COMPARISON:  Chest radiograph 03/12/2022. FINDINGS: CT CHEST FINDINGS Cardiovascular: Heart size upper normal. Tortuous thoracic aorta. Coronary artery calcifications. Trace pericardial effusion. Mediastinum/Nodes: Scattered small mediastinal lymph nodes not enlarged by size criteria. Small amount of enteric contrast within the mid esophagus. No associated wall thickening. No thyroid nodule. Lungs/Pleura: There are small bilateral pleural effusions and atelectasis in the lower lobes. Streaky opacities within both lower lobes also favor atelectasis. No pulmonary mass. No endobronchial debris or lesion. Occasional areas of septal thickening. Minimal apical predominant emphysema. Musculoskeletal: There are multiple lytic lesions consistent with history of multiple myeloma. Largest lesions are in the sternal manubrium and left scapula. There are multiple lesions throughout the thoracic spine. No pathologic compression fracture. No evidence of epidural tumor extension. Chest wall soft tissues are unremarkable. CT ABDOMEN PELVIS FINDINGS Hepatobiliary: 14 mm water density lesion in the left hepatic lobe series 2, image 55. Additional subcentimeter hypodensity in the right and left hepatic lobe Gallbladder physiologically  distended, no calcified stone. No biliary dilatation. Pancreas: No ductal dilatation or inflammation. Spleen: Normal in size without focal abnormality. Adrenals/Urinary Tract: Normal adrenal glands. Lobulated bilateral renal contours suggest scarring. No significant perinephric edema. No renal calculi. 11 mm cyst in the lower right kidney, needs no further follow-up. No evidence of perinephric collection. Unremarkable urinary bladder. Stomach/Bowel: Small duodenal diverticulum stomach is unremarkable. No bowel obstruction or inflammation. Normal appendix. Minimal sigmoid diverticulosis. No diverticulitis or acute colonic inflammation. Vascular/Lymphatic: Mild aorto bi-iliac atherosclerosis. No abdominopelvic adenopathy. Reproductive: Prostate is unremarkable. Other: No free air. No free fluid. No abdominopelvic collection. Slight subcutaneous edema in the flanks. Small bilateral fat containing inguinal hernias. Musculoskeletal: Multiple lytic lesions in the lumbar spine consistent with known myeloma. No pathologic compression fracture. Scattered lytic lesions in the pelvis. No evident lesion in the proximal for more to place the patient at risk of pathologic femur fracture. IMPRESSION: 1. Three hypodense liver lesions measures simple fluid density are likely cysts, although there are no prior exams to establish stability. The largest measures 14 mm, 2 additional lesions are subcentimeter. The possibility of hepatic abscess is not entirely excluded on this unenhanced exam, but felt less likely. Consider further assessment  with hepatic MRI as clinically indicated. 2. Small bilateral pleural effusions with compressive and streaky atelectasis in the lower lobes. Occasional areas of septal thickening, can be seen with pulmonary edema. 3. Innumerable lytic lesions throughout the skeleton consistent with known myeloma. No evidence of pathologic fracture. 4. Mild colonic diverticulosis without diverticulitis. Aortic  Atherosclerosis (ICD10-I70.0) and Emphysema (ICD10-J43.9). Electronically Signed   By: Keith Rake M.D.   On: 03/14/2022 22:11   ECHO TEE  Result Date: 03/14/2022    TRANSESOPHOGEAL ECHO REPORT   Patient Name:   Darren Hinton. Date of Exam: 03/14/2022 Medical Rec #:  [id number]           Height:       77.0 in Accession #:    [id number]          Weight:       217.6 lb Date of Birth:  July 15, 1946          BSA:          2.318 m Patient Age:    72 years            BP:           111/81 mmHg Patient Gender: M                   HR:           90 bpm. Exam Location:  Inpatient Procedure: Transesophageal Echo, Cardiac Doppler and Color Doppler Indications:     A flutter  History:         Patient has prior history of Echocardiogram examinations, most                  recent 03/12/2022. Mitral Valve Disease, Arrythmias:Atrial                  Fibrillation; Risk Factors:Dyslipidemia and Hypertension.                  Hypothyroidism.  Sonographer:     Eartha Inch Referring Phys:  [id number] HAO MENG Diagnosing Phys: Lyman Bishop MD PROCEDURE: After discussion of the risks and benefits of a TEE, an informed consent was obtained from the patient. TEE procedure time was 8 minutes. The transesophogeal probe was passed without difficulty through the esophogus of the patient. Imaged were  obtained with the patient in a supine position. Sedation performed by different physician. Image quality was good. The patient developed no complications during the procedure. 1. Cardioverted 4 time(s). 2. Cardioverted at 120J, 150J and 200J x 2 biphasic. Converted to sinus after the initial shock, then back in afib- converted to sinus again for a few seconds with 200J, then back in afib. IMPRESSIONS  1. Left ventricular ejection fraction, by estimation, is 50 to 55%. The left ventricle has low normal function. There is mild left ventricular hypertrophy.  2. Right ventricular systolic function is normal. The right ventricular size is normal.   3. No left atrial/left atrial appendage thrombus was detected.  4. The mitral valve is grossly normal. Mild mitral valve regurgitation.  5. The aortic valve is tricuspid. Aortic valve regurgitation is not visualized.  6. 1. Cardioverted 4 time(s).     2. Cardioverted at 120J, 150J and 200J x 2 biphasic. Converted to sinus after the initial shock, then back in afib- converted to sinus again for a few seconds with 200J, then back in afib. Conclusion(s)/Recommendation(s): No evidence of vegetation/infective endocarditis on this transesophageael echocardiogram. No LA/LAA thrombus identified.  Unsuccessful cardioversion performed without restoration of normal sinus rhythm. FINDINGS  Left Ventricle: Left ventricular ejection fraction, by estimation, is 50 to 55%. The left ventricle has low normal function. The left ventricular internal cavity size was normal in size. There is mild left ventricular hypertrophy. Right Ventricle: The right ventricular size is normal. No increase in right ventricular wall thickness. Right ventricular systolic function is normal. Left Atrium: Left atrial size was normal in size. No left atrial/left atrial appendage thrombus was detected. Right Atrium: Right atrial size was normal in size. Pericardium: There is no evidence of pericardial effusion. Mitral Valve: The mitral valve is grossly normal. Mild mitral valve regurgitation, with multiple jets. Tricuspid Valve: The tricuspid valve is grossly normal. Tricuspid valve regurgitation is trivial. Aortic Valve: The aortic valve is tricuspid. Aortic valve regurgitation is not visualized. Pulmonic Valve: The pulmonic valve was normal in structure. Pulmonic valve regurgitation is trivial. Aorta: The aortic root and ascending aorta are structurally normal, with no evidence of dilitation. IAS/Shunts: No atrial level shunt detected by color flow Doppler.   AORTA Ao Asc diam: 3.90 cm Lyman Bishop MD Electronically signed by Lyman Bishop MD Signature  Date/Time: 03/14/2022/11:53:24 AM    Final (Updated)    ECHOCARDIOGRAM COMPLETE  Result Date: 03/12/2022    ECHOCARDIOGRAM REPORT   Patient Name:   Darren Hinton. Date of Exam: 03/12/2022 Medical Rec #:  [id number]           Height:       77.0 in Accession #:    [id number]          Weight:       217.6 lb Date of Birth:  1945/09/26          BSA:          2.318 m Patient Age:    48 years            BP:           108/84 mmHg Patient Gender: M                   HR:           128 bpm. Exam Location:  Inpatient Procedure: 2D Echo, Cardiac Doppler, Color Doppler and Intracardiac            Opacification Agent Indications:    Atrial Flutter I48.92  History:        Patient has no prior history of Echocardiogram examinations.                 Risk Factors:Hypertension and Dyslipidemia. Hypothyroidism.                 Fever due to pneumonia. Chronic kidney disease, anemia.  Sonographer:    Darlina Sicilian RDCS Referring Phys: [id number] [place]  1. Left ventricular ejection fraction, by estimation, is 40 to 45%. The left ventricle has mildly decreased function. The left ventricle has no regional wall motion abnormalities. There is mild concentric left ventricular hypertrophy. Left ventricular diastolic parameters are consistent with Grade I diastolic dysfunction (impaired relaxation).  2. Right ventricular systolic function is normal. The right ventricular size is normal. There is mildly elevated pulmonary artery systolic pressure.  3. The mitral valve is normal in structure. Mild mitral valve regurgitation. No evidence of mitral stenosis.  4. The aortic valve is tricuspid. Aortic valve regurgitation is trivial. Aortic valve sclerosis is present, with no evidence of aortic valve stenosis.  5. The  inferior vena cava is normal in size with greater than 50% respiratory variability, suggesting right atrial pressure of 3 mmHg. FINDINGS  Left Ventricle: Left ventricular ejection fraction, by estimation, is 40  to 45%. The left ventricle has mildly decreased function. The left ventricle has no regional wall motion abnormalities. Definity contrast agent was given IV to delineate the left ventricular endocardial borders. The left ventricular internal cavity size was normal in size. There is mild concentric left ventricular hypertrophy. Left ventricular diastolic parameters are consistent with Grade I diastolic dysfunction (impaired relaxation). Right Ventricle: The right ventricular size is normal. No increase in right ventricular wall thickness. Right ventricular systolic function is normal. There is mildly elevated pulmonary artery systolic pressure. The tricuspid regurgitant velocity is 2.66  m/s, and with an assumed right atrial pressure of 8 mmHg, the estimated right ventricular systolic pressure is 22.5 mmHg. Left Atrium: Left atrial size was normal in size. Right Atrium: Right atrial size was normal in size. Pericardium: There is no evidence of pericardial effusion. Mitral Valve: The mitral valve is normal in structure. Mild mitral valve regurgitation. No evidence of mitral valve stenosis. Tricuspid Valve: The tricuspid valve is normal in structure. Tricuspid valve regurgitation is not demonstrated. No evidence of tricuspid stenosis. Aortic Valve: The aortic valve is tricuspid. Aortic valve regurgitation is trivial. Aortic valve sclerosis is present, with no evidence of aortic valve stenosis. Pulmonic Valve: The pulmonic valve was normal in structure. Pulmonic valve regurgitation is not visualized. No evidence of pulmonic stenosis. Aorta: The aortic root is normal in size and structure. Venous: The inferior vena cava is normal in size with greater than 50% respiratory variability, suggesting right atrial pressure of 3 mmHg. IAS/Shunts: No atrial level shunt detected by color flow Doppler.  LEFT VENTRICLE PLAX 2D LVIDd:         4.60 cm      Diastology LVIDs:         3.30 cm      LV e' medial:    7.94 cm/s LV PW:          1.20 cm      LV E/e' medial:  13.1 LV IVS:        1.30 cm      LV e' lateral:   12.85 cm/s LVOT diam:     2.10 cm      LV E/e' lateral: 8.1 LV SV:         38 LV SV Index:   16 LVOT Area:     3.46 cm  LV Volumes (MOD) LV vol d, MOD A2C: 114.0 ml LV vol d, MOD A4C: 117.0 ml LV vol s, MOD A2C: 61.9 ml LV vol s, MOD A4C: 54.5 ml LV SV MOD A2C:     52.1 ml LV SV MOD A4C:     117.0 ml LV SV MOD BP:      54.9 ml RIGHT VENTRICLE RV S prime:     14.80 cm/s LEFT ATRIUM             Index        RIGHT ATRIUM           Index LA diam:        4.10 cm 1.77 cm/m   RA Area:     13.20 cm LA Vol (A2C):   63.1 ml 27.22 ml/m  RA Volume:   31.60 ml  13.63 ml/m LA Vol (A4C):   70.4 ml 30.37 ml/m LA Biplane Vol: 72.2 ml  31.14 ml/m  AORTIC VALVE LVOT Vmax:   80.00 cm/s LVOT Vmean:  53.600 cm/s LVOT VTI:    0.109 m  AORTA Ao Root diam: 3.50 cm Ao Asc diam:  3.00 cm MITRAL VALVE                TRICUSPID VALVE MV Area (PHT): 4.19 cm     TR Peak grad:   28.3 mmHg MV Decel Time: 181 msec     TR Vmax:        266.00 cm/s MV E velocity: 103.75 cm/s                             SHUNTS                             Systemic VTI:  0.11 m                             Systemic Diam: 2.10 cm Kardie Tobb DO Electronically signed by Berniece Salines DO Signature Date/Time: 03/12/2022/2:19:52 PM    Final    DG CHEST PORT 1 VIEW  Result Date: 03/12/2022 CLINICAL DATA:  Shortness of breath. EXAM: PORTABLE CHEST 1 VIEW COMPARISON:  03/11/2022. FINDINGS: Similar mild interstitial and patchy airspace opacities in the left lung. Similar streaky opacities at the right lung base. No visible pleural effusions or pneumothorax. No acute osseous abnormality. Polyarticular degenerative change. IMPRESSION: 1. Similar mild interstitial and patchy airspace opacities in the left lung which could represent asymmetric edema or infection. 2. Similar mild streaky right basilar opacities, which could represent atelectasis and/or pneumonia. Electronically Signed   By: Margaretha Sheffield M.D.   On: 03/12/2022 08:08   VAS [place] LOWER EXTREMITY VENOUS (DVT)  Result Date: 03/12/2022  Lower Venous DVT Study Patient Name:  Megan Hayduk.  Date of Exam:   03/11/2022 Medical Rec #: [id number]            Accession #:    [id number] Date of Birth: 1946-01-13           Patient Gender: M Patient Age:   63 years Exam Location:  [facility] Procedure:      VAS [place] LOWER EXTREMITY VENOUS (DVT) Referring Phys: Raiford Noble --------------------------------------------------------------------------------  Indications: Swelling.  Comparison Study: No prior studies. Performing Technologist: Darlin Coco RDMS, RVT  Examination Guidelines: A complete evaluation includes B-mode imaging, spectral Doppler, color Doppler, and power Doppler as needed of all accessible portions of each vessel. Bilateral testing is considered an integral part of a complete examination. Limited examinations for reoccurring indications may be performed as noted. The reflux portion of the exam is performed with the patient in reverse Trendelenburg.  +---------+---------------+---------+-----------+----------+--------------+ RIGHT    CompressibilityPhasicitySpontaneityPropertiesThrombus Aging +---------+---------------+---------+-----------+----------+--------------+ CFV      Full           Yes      Yes                                 +---------+---------------+---------+-----------+----------+--------------+ SFJ      Full                                                        +---------+---------------+---------+-----------+----------+--------------+  FV Prox  Full                                                        +---------+---------------+---------+-----------+----------+--------------+ FV Mid   Full                                                        +---------+---------------+---------+-----------+----------+--------------+ FV DistalFull                                                         +---------+---------------+---------+-----------+----------+--------------+ PFV      Full                                                        +---------+---------------+---------+-----------+----------+--------------+ POP      Full           Yes      Yes                                 +---------+---------------+---------+-----------+----------+--------------+ PTV      Full                                                        +---------+---------------+---------+-----------+----------+--------------+ PERO     Full                                                        +---------+---------------+---------+-----------+----------+--------------+   +---------+---------------+---------+-----------+----------+--------------+ LEFT     CompressibilityPhasicitySpontaneityPropertiesThrombus Aging +---------+---------------+---------+-----------+----------+--------------+ CFV      Full           Yes      Yes                                 +---------+---------------+---------+-----------+----------+--------------+ SFJ      Full                                                        +---------+---------------+---------+-----------+----------+--------------+ FV Prox  Full                                                        +---------+---------------+---------+-----------+----------+--------------+  FV Mid   Full                                                        +---------+---------------+---------+-----------+----------+--------------+ FV DistalFull                                                        +---------+---------------+---------+-----------+----------+--------------+ PFV      Full                                                        +---------+---------------+---------+-----------+----------+--------------+ POP      Full           Yes      Yes                                  +---------+---------------+---------+-----------+----------+--------------+ PTV      Full                                                        +---------+---------------+---------+-----------+----------+--------------+ PERO     Full                                                        +---------+---------------+---------+-----------+----------+--------------+     Summary: RIGHT: - There is no evidence of deep vein thrombosis in the lower extremity.  - No cystic structure found in the popliteal fossa.  LEFT: - There is no evidence of deep vein thrombosis in the lower extremity.  - No cystic structure found in the popliteal fossa.  *See table(s) above for measurements and observations. Electronically signed by Jamelle Haring on 03/12/2022 at 2:07:01 AM.    Final    DG CHEST PORT 1 VIEW  Result Date: 03/11/2022 CLINICAL DATA:  Shortness of breath EXAM: PORTABLE CHEST 1 VIEW COMPARISON:  03/08/2022 FINDINGS: Heart size within normal limits. Interval development of interstitial opacities within the left mid to lower lung. Elevation of the right hemidiaphragm with streaky right basilar opacity. No pleural effusion or pneumothorax. IMPRESSION: 1. Interval development of interstitial opacities within the left mid to lower lung concerning for atypical/viral infection. 2. Streaky right basilar opacity may represent atelectasis versus developing infection. Electronically Signed   By: Davina Poke D.O.   On: 03/11/2022 10:37   DG Chest Port 1 View  Result Date: 03/08/2022 CLINICAL DATA:  Questionable sepsis - evaluate for abnormality Fever.  Chemotherapy, most recently Monday. EXAM: PORTABLE CHEST 1 VIEW COMPARISON:  Chest radiograph 03/12/2014 FINDINGS: The heart is normal in size. Streaky opacity at the right lung base is unchanged from prior exam and most  consistent with scarring. No evidence of acute airspace disease. No pulmonary edema, pleural effusion, or pneumothorax. No acute osseous  abnormalities are seen on this portable exam, patient with history of myeloma. IMPRESSION: No acute chest findings. Unchanged scarring at the right lung base. Electronically Signed   By: Keith Rake M.D.   On: 03/08/2022 23:45    ASSESSMENT & PLAN Darren Hinton. 76 y.o. male with medical history significant for IgA lambda multiple myeloma who presents for a follow up visit.   Previously we discussed the disease process of multiple myeloma.  We discussed the incurable nature of this disease and the nature of the treatment.  We discussed that this is a cancer of the bone marrow with plasma cells producing an abundance of M protein which in this patient's case causes his severe renal dysfunction.  Additionally we noted that myeloma can cause lytic lesions of the bone, anemia, and increased levels of calcium.  We discussed the treatment moving forward including doublet and triplet therapy.  Given the patient's good health overall, but poor kidney function I would recommend proceeding with CyBorD chemotherapy.  Mr. Worton voices understanding of the treatment regimen and the plan moving forward.  At this time we will plan to proceed with Velcade 1.5 mg per metered squared subq weekly, cyclophosphamide 300 mg per metered squared IV weekly, and dexamethasone 20 mg p.o. weekly.  Cycles will consist of 4 weekly treatments and will continue for a minimum of 6 cycles before consideration of maintenance therapy.  Additionally if kidney function improves can consider transitioning regimen to VRD.    # IgA lambda Multiple Myeloma -- Bone marrow biopsy performed on 01/16/2022 showed increased plasma cells consistent with a plasma cell neoplasm.  Normal FISH panel. --Patient meets diagnostic criteria based on kidney dysfunction and anemia. --Cycle 1 Day 1 of CyBorD chemotherapy started on 02/07/2022 Plan: -- Labs today show white blood cell 5.0, hemoglobin 8.5, MCV 104.2, and platelets of 397 --today is Cycle  2 Day 1 of CyBorD --RTC in 2 weeks with interval weekly treatments   #Supportive Care -- chemotherapy education complete -- port placement not required.  -- zofran 8mg  q8H PRN and compazine 10mg  PO q6H for nausea -- acyclovir 400mg  PO BID for VCZ prophylaxis -- allopurinol 300mg  PO daily for TLS prophylaxis --zometa to start after dental clearance. Will discuss once treatment is underway. -- no pain medication required at this time.   No orders of the defined types were placed in this encounter.  All questions were answered. The patient knows to call the clinic with any problems, questions or concerns.  A total of more than 30 minutes were spent on this encounter with face-to-face time and non-face-to-face time, including preparing to see the patient, ordering tests and/or medications, counseling the patient and coordination of care as outlined above.   Ledell Peoples, MD Department of Hematology/Oncology [place] at [facility] Phone: [phone] Pager: [phone] Email: [email or web address]  03/24/2022 12:42 PM

## 2022-03-24 ENCOUNTER — Inpatient Hospital Stay: Payer: No Typology Code available for payment source | Attending: Hematology and Oncology

## 2022-03-24 ENCOUNTER — Other Ambulatory Visit: Payer: Self-pay

## 2022-03-24 ENCOUNTER — Inpatient Hospital Stay: Payer: No Typology Code available for payment source

## 2022-03-24 ENCOUNTER — Inpatient Hospital Stay (HOSPITAL_BASED_OUTPATIENT_CLINIC_OR_DEPARTMENT_OTHER): Payer: No Typology Code available for payment source | Admitting: Hematology and Oncology

## 2022-03-24 VITALS — BP 136/81 | HR 98 | Temp 97.7°F | Resp 17 | Wt 215.3 lb

## 2022-03-24 DIAGNOSIS — Z87891 Personal history of nicotine dependence: Secondary | ICD-10-CM | POA: Insufficient documentation

## 2022-03-24 DIAGNOSIS — R001 Bradycardia, unspecified: Secondary | ICD-10-CM | POA: Diagnosis not present

## 2022-03-24 DIAGNOSIS — Z7952 Long term (current) use of systemic steroids: Secondary | ICD-10-CM | POA: Insufficient documentation

## 2022-03-24 DIAGNOSIS — N184 Chronic kidney disease, stage 4 (severe): Secondary | ICD-10-CM | POA: Diagnosis not present

## 2022-03-24 DIAGNOSIS — R531 Weakness: Secondary | ICD-10-CM | POA: Insufficient documentation

## 2022-03-24 DIAGNOSIS — Z5111 Encounter for antineoplastic chemotherapy: Secondary | ICD-10-CM | POA: Insufficient documentation

## 2022-03-24 DIAGNOSIS — Z7901 Long term (current) use of anticoagulants: Secondary | ICD-10-CM | POA: Diagnosis not present

## 2022-03-24 DIAGNOSIS — Z79624 Long term (current) use of inhibitors of nucleotide synthesis: Secondary | ICD-10-CM | POA: Diagnosis not present

## 2022-03-24 DIAGNOSIS — D649 Anemia, unspecified: Secondary | ICD-10-CM | POA: Diagnosis not present

## 2022-03-24 DIAGNOSIS — C9000 Multiple myeloma not having achieved remission: Secondary | ICD-10-CM

## 2022-03-24 DIAGNOSIS — Z79899 Other long term (current) drug therapy: Secondary | ICD-10-CM | POA: Diagnosis not present

## 2022-03-24 DIAGNOSIS — Z7969 Long term (current) use of other immunomodulators and immunosuppressants: Secondary | ICD-10-CM | POA: Diagnosis not present

## 2022-03-24 DIAGNOSIS — Z5112 Encounter for antineoplastic immunotherapy: Secondary | ICD-10-CM | POA: Diagnosis present

## 2022-03-24 DIAGNOSIS — D631 Anemia in chronic kidney disease: Secondary | ICD-10-CM

## 2022-03-24 LAB — CMP (CANCER CENTER ONLY)
ALT: 30 U/L (ref 0–44)
AST: 30 U/L (ref 15–41)
Albumin: 3.6 g/dL (ref 3.5–5.0)
Alkaline Phosphatase: 71 U/L (ref 38–126)
Anion gap: 7 (ref 5–15)
BUN: 11 mg/dL (ref 8–23)
CO2: 24 mmol/L (ref 22–32)
Calcium: 8.6 mg/dL — ABNORMAL LOW (ref 8.9–10.3)
Chloride: 107 mmol/L (ref 98–111)
Creatinine: 2.03 mg/dL — ABNORMAL HIGH (ref 0.61–1.24)
GFR, Estimated: 33 mL/min — ABNORMAL LOW (ref >60)
Glucose, Bld: 122 mg/dL — ABNORMAL HIGH (ref 70–99)
Potassium: 4.2 mmol/L (ref 3.5–5.1)
Sodium: 138 mmol/L (ref 135–145)
Total Bilirubin: 0.4 mg/dL (ref 0.3–1.2)
Total Protein: 6.7 g/dL (ref 6.5–8.1)

## 2022-03-24 LAB — CBC WITH DIFFERENTIAL (CANCER CENTER ONLY)
Abs Immature Granulocytes: 0.02 10*3/uL (ref 0.00–0.07)
Basophils Absolute: 0.1 10*3/uL (ref 0.0–0.1)
Basophils Relative: 2 %
Eosinophils Absolute: 0.2 10*3/uL (ref 0.0–0.5)
Eosinophils Relative: 4 %
HCT: 27.4 % — ABNORMAL LOW (ref 39.0–52.0)
Hemoglobin: 8.5 g/dL — ABNORMAL LOW (ref 13.0–17.0)
Immature Granulocytes: 0 %
Lymphocytes Relative: 14 %
Lymphs Abs: 0.7 10*3/uL (ref 0.7–4.0)
MCH: 32.3 pg (ref 26.0–34.0)
MCHC: 31 g/dL (ref 30.0–36.0)
MCV: 104.2 fL — ABNORMAL HIGH (ref 80.0–100.0)
Monocytes Absolute: 0.5 10*3/uL (ref 0.1–1.0)
Monocytes Relative: 11 %
Neutro Abs: 3.5 10*3/uL (ref 1.7–7.7)
Neutrophils Relative %: 69 %
Platelet Count: 397 10*3/uL (ref 150–400)
RBC: 2.63 MIL/uL — ABNORMAL LOW (ref 4.22–5.81)
RDW: 15.9 % — ABNORMAL HIGH (ref 11.5–15.5)
WBC Count: 5 10*3/uL (ref 4.0–10.5)
nRBC: 0 % (ref 0.0–0.2)

## 2022-03-24 LAB — LACTATE DEHYDROGENASE: LDH: 191 U/L (ref 98–192)

## 2022-03-24 MED ORDER — SODIUM CHLORIDE 0.9 % IV SOLN
300.0000 mg/m2 | Freq: Once | INTRAVENOUS | Status: AC
  Administered 2022-03-24: 700 mg via INTRAVENOUS
  Filled 2022-03-24: qty 35

## 2022-03-24 MED ORDER — SODIUM CHLORIDE 0.9 % IV SOLN: Freq: Once | INTRAVENOUS | Status: AC

## 2022-03-24 MED ORDER — PALONOSETRON HCL INJECTION 0.25 MG/5ML
0.2500 mg | Freq: Once | INTRAVENOUS | Status: AC
  Administered 2022-03-24: 0.25 mg via INTRAVENOUS
  Filled 2022-03-24: qty 5

## 2022-03-24 MED ORDER — BORTEZOMIB CHEMO SQ INJECTION 3.5 MG (2.5MG/ML)
1.5000 mg/m2 | Freq: Once | INTRAMUSCULAR | Status: AC
  Administered 2022-03-24: 3.5 mg via SUBCUTANEOUS
  Filled 2022-03-24: qty 1.4

## 2022-03-24 NOTE — Patient Instructions (Signed)
[facility] ONCOLOGY  Discharge Instructions: Thank you for choosing [facility] to provide your oncology and hematology care.   If you have a lab appointment with the [facility], please go directly to the [facility] and check in at the registration area.   Wear comfortable clothing and clothing appropriate for easy access to any Portacath or PICC line.   We strive to give you quality time with your provider. You may need to reschedule your appointment if you arrive late (15 or more minutes).  Arriving late affects you and other patients whose appointments are after yours.  Also, if you miss three or more appointments without notifying the office, you may be dismissed from the clinic at the provider's discretion.      For prescription refill requests, have your pharmacy contact our office and allow 72 hours for refills to be completed.    Today you received the following chemotherapy and/or immunotherapy agents: Velcade and Cytoxan    To help prevent nausea and vomiting after your treatment, we encourage you to take your nausea medication as directed.  BELOW ARE SYMPTOMS THAT SHOULD BE REPORTED IMMEDIATELY: *FEVER GREATER THAN 100.4 F (38 C) OR HIGHER *CHILLS OR SWEATING *NAUSEA AND VOMITING THAT IS NOT CONTROLLED WITH YOUR NAUSEA MEDICATION *UNUSUAL SHORTNESS OF BREATH *UNUSUAL BRUISING OR BLEEDING *URINARY PROBLEMS (pain or burning when urinating, or frequent urination) *BOWEL PROBLEMS (unusual diarrhea, constipation, pain near the anus) TENDERNESS IN MOUTH AND THROAT WITH OR WITHOUT PRESENCE OF ULCERS (sore throat, sores in mouth, or a toothache) UNUSUAL RASH, SWELLING OR PAIN  UNUSUAL VAGINAL DISCHARGE OR ITCHING   Items with * indicate a potential emergency and should be followed up as soon as possible or go to the Emergency Department if any problems should occur.  Please show the CHEMOTHERAPY ALERT CARD or IMMUNOTHERAPY ALERT CARD at  check-in to the Emergency Department and triage nurse.  Should you have questions after your visit or need to cancel or reschedule your appointment, please contact [facility]  Dept: [phone]  and follow the prompts.  Office hours are 8:00 a.m. to 4:30 p.m. Monday - Friday. Please note that voicemails left after 4:00 p.m. may not be returned until the following business day.  We are closed weekends and major holidays. You have access to a nurse at all times for urgent questions. Please call the main number to the clinic Dept: [phone] and follow the prompts.   For any non-urgent questions, you may also contact your provider using MyChart. We now offer e-Visits for anyone 64 and older to request care online for non-urgent symptoms. For details visit [email or web address].   Also download the MyChart app! Go to the app store, search "MyChart", open the app, select [facility], and log in with your MyChart username and password.  Masks are optional in the cancer centers. If you would like for your care team to wear a mask while they are taking care of you, please let them know. For doctor visits, patients may have with them one support person who is at least 77 years old. At this time, visitors are not allowed in the infusion area.

## 2022-03-24 NOTE — Progress Notes (Signed)
Ok to treat with serum creatinine of 2.'03mg'$ /dL per Dr. [name].  Patient took steroid dose at home.

## 2022-03-26 ENCOUNTER — Encounter (HOSPITAL_COMMUNITY): Payer: Self-pay | Admitting: Cardiovascular Disease

## 2022-03-27 ENCOUNTER — Ambulatory Visit (HOSPITAL_COMMUNITY): Payer: No Typology Code available for payment source | Admitting: Physician Assistant

## 2022-03-31 ENCOUNTER — Inpatient Hospital Stay (HOSPITAL_BASED_OUTPATIENT_CLINIC_OR_DEPARTMENT_OTHER): Payer: No Typology Code available for payment source | Admitting: Physician Assistant

## 2022-03-31 ENCOUNTER — Inpatient Hospital Stay (HOSPITAL_BASED_OUTPATIENT_CLINIC_OR_DEPARTMENT_OTHER): Payer: No Typology Code available for payment source

## 2022-03-31 ENCOUNTER — Other Ambulatory Visit: Payer: Self-pay

## 2022-03-31 ENCOUNTER — Inpatient Hospital Stay: Payer: No Typology Code available for payment source

## 2022-03-31 VITALS — BP 163/69 | HR 51 | Temp 98.2°F | Resp 18 | Wt 214.5 lb

## 2022-03-31 DIAGNOSIS — C9000 Multiple myeloma not having achieved remission: Secondary | ICD-10-CM

## 2022-03-31 DIAGNOSIS — I4891 Unspecified atrial fibrillation: Secondary | ICD-10-CM

## 2022-03-31 DIAGNOSIS — R001 Bradycardia, unspecified: Secondary | ICD-10-CM | POA: Diagnosis not present

## 2022-03-31 DIAGNOSIS — Z5112 Encounter for antineoplastic immunotherapy: Secondary | ICD-10-CM | POA: Diagnosis not present

## 2022-03-31 LAB — CMP (CANCER CENTER ONLY)
ALT: 14 U/L (ref 0–44)
AST: 16 U/L (ref 15–41)
Albumin: 3.8 g/dL (ref 3.5–5.0)
Alkaline Phosphatase: 70 U/L (ref 38–126)
Anion gap: 6 (ref 5–15)
BUN: 12 mg/dL (ref 8–23)
CO2: 23 mmol/L (ref 22–32)
Calcium: 8.9 mg/dL (ref 8.9–10.3)
Chloride: 111 mmol/L (ref 98–111)
Creatinine: 1.81 mg/dL — ABNORMAL HIGH (ref 0.61–1.24)
GFR, Estimated: 38 mL/min — ABNORMAL LOW (ref >60)
Glucose, Bld: 97 mg/dL (ref 70–99)
Potassium: 4.2 mmol/L (ref 3.5–5.1)
Sodium: 140 mmol/L (ref 135–145)
Total Bilirubin: 0.5 mg/dL (ref 0.3–1.2)
Total Protein: 6.4 g/dL — ABNORMAL LOW (ref 6.5–8.1)

## 2022-03-31 LAB — CBC WITH DIFFERENTIAL (CANCER CENTER ONLY)
Abs Immature Granulocytes: 0.03 10*3/uL (ref 0.00–0.07)
Basophils Absolute: 0 10*3/uL (ref 0.0–0.1)
Basophils Relative: 1 %
Eosinophils Absolute: 0.1 10*3/uL (ref 0.0–0.5)
Eosinophils Relative: 2 %
HCT: 27.9 % — ABNORMAL LOW (ref 39.0–52.0)
Hemoglobin: 8.8 g/dL — ABNORMAL LOW (ref 13.0–17.0)
Immature Granulocytes: 1 %
Lymphocytes Relative: 10 %
Lymphs Abs: 0.4 10*3/uL — ABNORMAL LOW (ref 0.7–4.0)
MCH: 32.6 pg (ref 26.0–34.0)
MCHC: 31.5 g/dL (ref 30.0–36.0)
MCV: 103.3 fL — ABNORMAL HIGH (ref 80.0–100.0)
Monocytes Absolute: 0.3 10*3/uL (ref 0.1–1.0)
Monocytes Relative: 7 %
Neutro Abs: 3 10*3/uL (ref 1.7–7.7)
Neutrophils Relative %: 79 %
Platelet Count: 186 10*3/uL (ref 150–400)
RBC: 2.7 MIL/uL — ABNORMAL LOW (ref 4.22–5.81)
RDW: 16.3 % — ABNORMAL HIGH (ref 11.5–15.5)
WBC Count: 3.7 10*3/uL — ABNORMAL LOW (ref 4.0–10.5)
nRBC: 0.8 % — ABNORMAL HIGH (ref 0.0–0.2)

## 2022-03-31 LAB — LACTATE DEHYDROGENASE: LDH: 164 U/L (ref 98–192)

## 2022-03-31 MED ORDER — BORTEZOMIB CHEMO SQ INJECTION 3.5 MG (2.5MG/ML)
1.5000 mg/m2 | Freq: Once | INTRAMUSCULAR | Status: AC
  Administered 2022-03-31: 3.5 mg via SUBCUTANEOUS
  Filled 2022-03-31: qty 1.4

## 2022-03-31 MED ORDER — SODIUM CHLORIDE 0.9 % IV SOLN
300.0000 mg/m2 | Freq: Once | INTRAVENOUS | Status: AC
  Administered 2022-03-31: 700 mg via INTRAVENOUS
  Filled 2022-03-31: qty 35

## 2022-03-31 MED ORDER — SODIUM CHLORIDE 0.9 % IV SOLN: Freq: Once | INTRAVENOUS | Status: AC

## 2022-03-31 MED ORDER — PALONOSETRON HCL INJECTION 0.25 MG/5ML
0.2500 mg | Freq: Once | INTRAVENOUS | Status: AC
  Administered 2022-03-31: 0.25 mg via INTRAVENOUS
  Filled 2022-03-31: qty 5

## 2022-03-31 NOTE — Progress Notes (Signed)
Second note opened in error.

## 2022-03-31 NOTE — Progress Notes (Signed)
Symptom Management Consult note [facility]    Patient Care Team: [name], MD as PCP - General (Internal Medicine)    Name of the patient: Darren Hinton  [id number]  03-Apr-1946   Date of visit: 03/31/2022    Chief complaint/ Reason for visit- bradycardia  Oncology History  Multiple myeloma not having achieved remission ([facility])  01/30/2022 Initial Diagnosis   Multiple myeloma not having achieved remission ([facility])   02/07/2022 -  Chemotherapy   Patient is on Treatment Plan : MULTIPLE MYELOMA CyBorD - Weekly Bortezomib q28d x 8 Cycles       Current Therapy: Velcade and Cytoxan  Last treatment:  Day  8  Cycle  2 today  Interval history- Darren Hinton. is a 76 y.o. with oncologic history as above seen in the infusion center today with chief complaint of bradycardia.  Patient here for chemotherapy today and was noticed to have heart rate in the 50s.  Chart review shows patient was recently admitted for an 8-day hospital stay and discharged on 03/17/2022 for fever and atrial flutter with RVR. He was started on amiodarone and his dose of metoprolol was decreased from 100 to 50 mg daily. He is scheduled at the [facility] in x 4 days.  Patient reports ever since he recent hospital stay he has been trying to regain his strength as he feels deconditioned after spending prolonged time in bed. He admits to generalized weakness that is slightly worse in his legs. He reports feeling as if he is getting stronger every day. He does notice he gets winded with prolonged periods of activity and denies any shortness of breath at rest or with simple tasks.  He denies any fever, chills, lightheadedness, dizziness, chest pain, palpitations, syncope.   ROS  All other systems are reviewed and are negative for acute change except as noted in the HPI.    Allergies  Allergen Reactions   Zithromax [Azithromycin] Other (See Comments)     Past Medical History:  Diagnosis  Date   Allergy    Anxiety    Arthritis    Depression    Heart murmur    Hyperlipidemia    Hyperplastic colon polyp    Hyperproteinemia    Hypertension    Hypothyroidism    Mitral valve prolapse    Paroxysmal atrial fibrillation (HCC)    Thyroid disease      Past Surgical History:  Procedure Laterality Date   CARDIOVERSION N/A 03/14/2022   Procedure: CARDIOVERSION;  Surgeon: Pixie Casino, MD;  Location: [facility];  Service: Cardiovascular;  Laterality: N/A;   TEE WITHOUT CARDIOVERSION N/A 03/14/2022   Procedure: TRANSESOPHAGEAL ECHOCARDIOGRAM (TEE);  Surgeon: Pixie Casino, MD;  Location: [facility] ENDOSCOPY;  Service: Cardiovascular;  Laterality: N/A;    Social History   Socioeconomic History   Marital status: Married    Spouse name: Not on file   Number of children: Not on file   Years of education: Not on file   Highest education level: Not on file  Occupational History   Not on file  Tobacco Use   Smoking status: Former    Years: 20.00    Types: Cigarettes    Quit date: 10/01/1978    Years since quitting: 43.5   Smokeless tobacco: Never  Substance and Sexual Activity   Alcohol use: Yes    Alcohol/week: 2.0 standard drinks of alcohol    Types: 2 Glasses of wine per week  Comment: occasionally   Drug use: No   Sexual activity: Not on file  Other Topics Concern   Not on file  Social History Narrative   Not on file   Social Determinants of Health   Financial Resource Strain: Not on file  Food Insecurity: Not on file  Transportation Needs: Not on file  Physical Activity: Not on file  Stress: Not on file  Social Connections: Not on file  Intimate Partner Violence: Not on file    Family History  Problem Relation Age of Onset   Cancer Father    Colon cancer Neg Hx    Esophageal cancer Neg Hx    Liver cancer Neg Hx    Pancreatic cancer Neg Hx    Rectal cancer Neg Hx    Stomach cancer Neg Hx      Current Outpatient Medications:    acyclovir  (ZOVIRAX) 400 MG tablet, Take 1 tablet (400 mg total) by mouth 2 (two) times daily., Disp: 180 tablet, Rfl: 1   allopurinol (ZYLOPRIM) 300 MG tablet, Take 1 tablet (300 mg total) by mouth daily., Disp: 90 tablet, Rfl: 1   ALPRAZolam (XANAX) 0.25 MG tablet, Take 0.25 mg by mouth daily as needed. Anxiety , Disp: , Rfl:    amiodarone (PACERONE) 200 MG tablet, Take 1 tablet (200 mg total) by mouth daily., Disp: 30 tablet, Rfl: 0   amiodarone (PACERONE) 200 MG tablet, Take 1 tablet (200 mg total) by mouth 2 (two) times daily for 4 days., Disp: 8 tablet, Rfl: 0   apixaban (ELIQUIS) 5 MG TABS tablet, Take 1 tablet (5 mg total) by mouth 2 (two) times daily., Disp: 60 tablet, Rfl: 0   atorvastatin (LIPITOR) 20 MG tablet, Take 20 mg by mouth daily., Disp: , Rfl:    dexamethasone (DECADRON) 4 MG tablet, Take 10 tablets (40 mg total) by mouth once a week. Take steroids pills the in the morning on chemotherapy days., Disp: 120 tablet, Rfl: 1   ergocalciferol (VITAMIN D2) 1.25 MG (50000 UT) capsule, Take 50,000 Units by mouth every Wednesday., Disp: , Rfl:    ferrous gluconate (FERGON) 324 MG tablet, Take 1 tablet (324 mg total) by mouth 3 (three) times daily with meals., Disp: 90 tablet, Rfl: 0   fluticasone (FLONASE) 50 MCG/ACT nasal spray, Place 2 sprays into the nose daily as needed. Allergies , Disp: , Rfl:    levothyroxine (SYNTHROID) 100 MCG tablet, Take 100 mcg by mouth daily before breakfast., Disp: , Rfl:    loratadine (CLARITIN) 10 MG tablet, Take 10 mg by mouth daily., Disp: , Rfl:    meclizine (ANTIVERT) 25 MG tablet, Take 1 tablet (25 mg total) by mouth 3 (three) times daily as needed for dizziness., Disp: 30 tablet, Rfl: 0   metoprolol succinate (TOPROL-XL) 50 MG 24 hr tablet, Take 1 tablet (50 mg total) by mouth daily. Take with or immediately following a meal., Disp: 30 tablet, Rfl: 0   ondansetron (ZOFRAN) 8 MG tablet, Take 1 tablet (8 mg total) by mouth every 8 (eight) hours as needed., Disp: 30  tablet, Rfl: 0   prochlorperazine (COMPAZINE) 10 MG tablet, Take 1 tablet (10 mg total) by mouth every 6 (six) hours as needed for nausea or vomiting., Disp: 30 tablet, Rfl: 0   senna-docusate (SENOKOT-S) 8.6-50 MG tablet, Take 1 tablet by mouth at bedtime as needed for mild constipation., Disp: , Rfl:    vitamin B-12 (CYANOCOBALAMIN) 500 MCG tablet, Take 1 tablet by mouth daily., Disp: ,  Rfl:   Current Facility-Administered Medications:    0.9 %  sodium chloride infusion, 500 mL, Intravenous, Once, Armbruster, Carlota Raspberry, MD  PHYSICAL EXAM: ECOG FS:1 - Symptomatic but completely ambulatory   T: 98.2   BP: 163/69   HR: 53   Resp: 18   O2: 100% Physical Exam Vitals and nursing note reviewed.  Constitutional:      Appearance: He is well-developed. He is not ill-appearing or toxic-appearing.  HENT:     Head: Normocephalic and atraumatic.     Nose: Nose normal.  Eyes:     General: No scleral icterus.       Right eye: No discharge.        Left eye: No discharge.     Conjunctiva/sclera: Conjunctivae normal.  Neck:     Vascular: No JVD.  Cardiovascular:     Rate and Rhythm: Regular rhythm. Bradycardia present.     Pulses: Normal pulses.     Heart sounds: Normal heart sounds.  Pulmonary:     Effort: Pulmonary effort is normal.     Breath sounds: Normal breath sounds.  Abdominal:     General: There is no distension.  Musculoskeletal:        General: Normal range of motion.     Cervical back: Normal range of motion.     Right lower leg: No edema.     Left lower leg: No edema.  Skin:    General: Skin is warm and dry.  Neurological:     Mental Status: He is oriented to person, place, and time.     GCS: GCS eye subscore is 4. GCS verbal subscore is 5. GCS motor subscore is 6.     Comments: Fluent speech, no facial droop.  Psychiatric:        Behavior: Behavior normal.        LABORATORY DATA: I have reviewed the data as listed    Latest Ref Rng & Units 03/31/2022    9:33 AM  03/24/2022   10:15 AM 03/17/2022    4:28 AM  CBC  WBC 4.0 - 10.5 K/uL 3.7  5.0  5.4   Hemoglobin 13.0 - 17.0 g/dL 8.8  8.5  7.7   Hematocrit 39.0 - 52.0 % 27.9  27.4  24.9   Platelets 150 - 400 K/uL 186  397  507         Latest Ref Rng & Units 03/31/2022    9:33 AM 03/24/2022   10:15 AM 03/15/2022    5:13 AM  CMP  Glucose 70 - 99 mg/dL 97  122  85   BUN 8 - 23 mg/dL '12  11  17   ' Creatinine 0.61 - 1.24 mg/dL 1.81  2.03  2.05   Sodium 135 - 145 mmol/L 140  138  139   Potassium 3.5 - 5.1 mmol/L 4.2  4.2  4.5   Chloride 98 - 111 mmol/L 111  107  113   CO2 22 - 32 mmol/L '23  24  15   ' Calcium 8.9 - 10.3 mg/dL 8.9  8.6  8.3   Total Protein 6.5 - 8.1 g/dL 6.4  6.7    Total Bilirubin 0.3 - 1.2 mg/dL 0.5  0.4    Alkaline Phos 38 - 126 U/L 70  71    AST 15 - 41 U/L 16  30    ALT 0 - 44 U/L 14  30         RADIOGRAPHIC STUDIES (from last 24  hours if applicable) I have personally reviewed the radiological images as listed and agreed with the findings in the report. No results found.     ASSESSMENT & PLAN: Patient is a 76 y.o. male  with oncologic history of IgA lambda multiple myeloma followed by Dr. [name].  I have viewed most recent oncology note as well as notes from recent hospital admission and lab work.   #) Bradycardia-patient is afebrile, normotensive. He is very well appearing. He is bradycardic in 50s and is currently asymptomatic.  EKG performed and shows sinus bradycardia with first-degree block.  Patient has been compliant with his medications including amiodarone, Eliquis and metoprolol.  Patient is scheduled to see A-fib clinic on 10/04/2021.  As patient is asymptomatic from the first-degree block would recommend him holding the metoprolol till he sees cardiology. He does not have history of bradycardia so feel that the addition of amiodarone to his regimen of metoprolol is contributing to the bradycardia.  Patient should stay on amiodarone to help with rhythm control.  I  discussed plan with patient and significant other over the phone who are agreeable. Strict ED precautions discussed should symptoms worsen.   #) IgA lambda multiple myeloma- Next appointment with oncologist is 04/07/22.   Visit Diagnosis: 1. Multiple myeloma not having achieved remission ([facility])   2. Bradycardia      No orders of the defined types were placed in this encounter.   All questions were answered. The patient knows to call the clinic with any problems, questions or concerns. No barriers to learning was detected.  I have spent a total of 20 minutes minutes of face-to-face and non-face-to-face time, preparing to see the patient, obtaining and/or reviewing separately obtained history, performing a medically appropriate examination, counseling and educating the patient, ordering tests, documenting clinical information in the electronic health record, and care coordination (communications with other health care professionals or caregivers).    Thank you for allowing me to participate in the care of this patient.    Barrie Folk, PA-C Department of Hematology/Oncology [facility] at [facility] Phone: [phone]  [phone]    03/31/2022 1:41 PM

## 2022-03-31 NOTE — Patient Instructions (Signed)
[facility]  Discharge Instructions: Thank you for choosing [facility] to provide your oncology and hematology care.   If you have a lab appointment with the [facility], please go directly to the [facility] and check in at the registration area.   Wear comfortable clothing and clothing appropriate for easy access to any Portacath or PICC line.   We strive to give you quality time with your provider. You may need to reschedule your appointment if you arrive late (15 or more minutes).  Arriving late affects you and other patients whose appointments are after yours.  Also, if you miss three or more appointments without notifying the office, you may be dismissed from the clinic at the provider's discretion.      For prescription refill requests, have your pharmacy contact our office and allow 72 hours for refills to be completed.    Today you received the following chemotherapy and/or immunotherapy agents Velcade & Cytoxan      To help prevent nausea and vomiting after your treatment, we encourage you to take your nausea medication as directed.  BELOW ARE SYMPTOMS THAT SHOULD BE REPORTED IMMEDIATELY: *FEVER GREATER THAN 100.4 F (38 C) OR HIGHER *CHILLS OR SWEATING *NAUSEA AND VOMITING THAT IS NOT CONTROLLED WITH YOUR NAUSEA MEDICATION *UNUSUAL SHORTNESS OF BREATH *UNUSUAL BRUISING OR BLEEDING *URINARY PROBLEMS (pain or burning when urinating, or frequent urination) *BOWEL PROBLEMS (unusual diarrhea, constipation, pain near the anus) TENDERNESS IN MOUTH AND THROAT WITH OR WITHOUT PRESENCE OF ULCERS (sore throat, sores in mouth, or a toothache) UNUSUAL RASH, SWELLING OR PAIN  UNUSUAL VAGINAL DISCHARGE OR ITCHING   Items with * indicate a potential emergency and should be followed up as soon as possible or go to the Emergency Department if any problems should occur.  Please show the CHEMOTHERAPY ALERT CARD or IMMUNOTHERAPY ALERT CARD at  check-in to the Emergency Department and triage nurse.  Should you have questions after your visit or need to cancel or reschedule your appointment, please contact [facility]  Dept: [phone]  and follow the prompts.  Office hours are 8:00 a.m. to 4:30 p.m. Monday - Friday. Please note that voicemails left after 4:00 p.m. may not be returned until the following business day.  We are closed weekends and major holidays. You have access to a nurse at all times for urgent questions. Please call the main number to the clinic Dept: [phone] and follow the prompts.   For any non-urgent questions, you may also contact your provider using MyChart. We now offer e-Visits for anyone 52 and older to request care online for non-urgent symptoms. For details visit [email or web address].   Also download the MyChart app! Go to the app store, search "MyChart", open the app, select [facility], and log in with your MyChart username and password.  Masks are optional in the cancer centers. If you would like for your care team to wear a mask while they are taking care of you, please let them know. For doctor visits, patients may have with them one support person who is at least 77 years old. At this time, visitors are not allowed in the infusion area.

## 2022-03-31 NOTE — Progress Notes (Signed)
Per Dr. [name], ok to treat with elevated creatinine.  MD nurse and symptom management PA at bedside post EKG to speak to pt and family (via phone) regarding bradycardia.

## 2022-04-01 LAB — KAPPA/LAMBDA LIGHT CHAINS
Kappa free light chain: 16.2 mg/L (ref 3.3–19.4)
Kappa, lambda light chain ratio: 1.93 — ABNORMAL HIGH (ref 0.26–1.65)
Lambda free light chains: 8.4 mg/L (ref 5.7–26.3)

## 2022-04-02 ENCOUNTER — Encounter (HOSPITAL_COMMUNITY): Admission: RE | Payer: No Typology Code available for payment source | Source: Home / Self Care

## 2022-04-02 ENCOUNTER — Ambulatory Visit (HOSPITAL_COMMUNITY)
Admission: RE | Admit: 2022-04-02 | Payer: No Typology Code available for payment source | Source: Home / Self Care | Admitting: Cardiovascular Disease

## 2022-04-02 ENCOUNTER — Ambulatory Visit: Payer: No Typology Code available for payment source | Admitting: Infectious Diseases

## 2022-04-02 SURGERY — CARDIOVERSION
Anesthesia: General

## 2022-04-03 ENCOUNTER — Encounter (HOSPITAL_COMMUNITY): Payer: Self-pay | Admitting: Physician Assistant

## 2022-04-03 ENCOUNTER — Ambulatory Visit (HOSPITAL_COMMUNITY): Payer: No Typology Code available for payment source | Admitting: Physician Assistant

## 2022-04-03 ENCOUNTER — Ambulatory Visit (HOSPITAL_COMMUNITY)
Admission: RE | Admit: 2022-04-03 | Discharge: 2022-04-03 | Disposition: A | Discharge status: Home / Self Care | Payer: Medicare Other | Source: Ambulatory Visit | Attending: Physician Assistant | Admitting: Physician Assistant

## 2022-04-03 VITALS — BP 140/90 | HR 136 | Ht 77.0 in | Wt 217.0 lb

## 2022-04-03 DIAGNOSIS — I4891 Unspecified atrial fibrillation: Secondary | ICD-10-CM | POA: Diagnosis not present

## 2022-04-03 DIAGNOSIS — C9000 Multiple myeloma not having achieved remission: Secondary | ICD-10-CM | POA: Insufficient documentation

## 2022-04-03 DIAGNOSIS — D6869 Other thrombophilia: Secondary | ICD-10-CM | POA: Insufficient documentation

## 2022-04-03 DIAGNOSIS — Z7901 Long term (current) use of anticoagulants: Secondary | ICD-10-CM | POA: Insufficient documentation

## 2022-04-03 DIAGNOSIS — I4819 Other persistent atrial fibrillation: Secondary | ICD-10-CM | POA: Diagnosis not present

## 2022-04-03 DIAGNOSIS — E785 Hyperlipidemia, unspecified: Secondary | ICD-10-CM | POA: Insufficient documentation

## 2022-04-03 DIAGNOSIS — I4892 Unspecified atrial flutter: Secondary | ICD-10-CM | POA: Insufficient documentation

## 2022-04-03 DIAGNOSIS — I10 Essential (primary) hypertension: Secondary | ICD-10-CM | POA: Diagnosis not present

## 2022-04-03 DIAGNOSIS — E039 Hypothyroidism, unspecified: Secondary | ICD-10-CM | POA: Insufficient documentation

## 2022-04-03 MED ORDER — METOPROLOL SUCCINATE ER 25 MG PO TB24: 25.0000 mg | ORAL_TABLET | Freq: Every day | ORAL | 3 refills | Status: DC

## 2022-04-03 NOTE — Progress Notes (Signed)
Primary Care Physician: [name], MD Primary Cardiologist: Dr Sallyanne Kuster  Primary Electrophysiologist: none Referring Physician: Dr Lutricia Horsfall. is a 76 y.o. male with a history of multiple myeloma, HTN, HLD, hypothyroidism, atrial flutter, atrial fibrillation who presents for consultation in the [facility].  The patient was initially diagnosed with atrial fibrillation remotely in 11-22-07 when his father passed away. Patient is on Eliquis for a CHADS2VASC score of 3. He was admitted 03/09/22 for pneumonia/sepsis and was in rapid atrial fibrillation as well. He was started on anticoagulation and IV amiodarone. He underwent TEE/DCCV 03/14/22 which was unsuccessful. At his visit with hematology 03/31/22 he was in sinus bradycardia and his BB was held. He is back in rapid afib today. He does not feel palpitations but is perhaps more fatigued. He denies any bleeding issues on anticoagulation.   Today, he denies symptoms of palpitations, chest pain, shortness of breath, orthopnea, PND, lower extremity edema, dizziness, presyncope, syncope, snoring, daytime somnolence, bleeding, or neurologic sequela. The patient is tolerating medications without difficulties and is otherwise without complaint today.    Atrial Fibrillation Risk Factors:  he does not have symptoms or diagnosis of sleep apnea. he does not have a history of rheumatic fever.   he has a BMI of Body mass index is 25.73 kg/m.Marland Kitchen Filed Weights   04/03/22 1340  Weight: 98.4 kg    Family History  Problem Relation Age of Onset   Cancer Father    Colon cancer Neg Hx    Esophageal cancer Neg Hx    Liver cancer Neg Hx    Pancreatic cancer Neg Hx    Rectal cancer Neg Hx    Stomach cancer Neg Hx      Atrial Fibrillation Management history:  Previous antiarrhythmic drugs: amiodarone  Previous cardioversions: 03/14/22 Previous ablations: none CHADS2VASC score: 3 Anticoagulation  history: warfarin, Eliquis   Past Medical History:  Diagnosis Date   Allergy    Anxiety    Arthritis    Depression    Heart murmur    Hyperlipidemia    Hyperplastic colon polyp    Hyperproteinemia    Hypertension    Hypothyroidism    Mitral valve prolapse    Paroxysmal atrial fibrillation (HCC)    Thyroid disease    Past Surgical History:  Procedure Laterality Date   CARDIOVERSION N/A 03/14/2022   Procedure: CARDIOVERSION;  Surgeon: Pixie Casino, MD;  Location[facility] ENDOSCOPY;  Service: Cardiovascular;  Laterality: N/A;   TEE WITHOUT CARDIOVERSION N/A 03/14/2022   Procedure: TRANSESOPHAGEAL ECHOCARDIOGRAM (TEE);  Surgeon: Pixie Casino, MD;  Location: [facility] ENDOSCOPY;  Service: Cardiovascular;  Laterality: N/A;    Current Outpatient Medications  Medication Sig Dispense Refill   acyclovir (ZOVIRAX) 400 MG tablet Take 1 tablet (400 mg total) by mouth 2 (two) times daily. 180 tablet 1   allopurinol (ZYLOPRIM) 300 MG tablet Take 1 tablet (300 mg total) by mouth daily. 90 tablet 1   ALPRAZolam (XANAX) 0.25 MG tablet Take 0.25 mg by mouth daily as needed. Anxiety      amiodarone (PACERONE) 200 MG tablet Take 1 tablet (200 mg total) by mouth daily. 30 tablet 0   apixaban (ELIQUIS) 5 MG TABS tablet Take 1 tablet (5 mg total) by mouth 2 (two) times daily. 60 tablet 0   atorvastatin (LIPITOR) 20 MG tablet Take 20 mg by mouth daily.     dexamethasone (DECADRON) 4 MG tablet Take 10 tablets (  40 mg total) by mouth once a week. Take steroids pills the in the morning on chemotherapy days. 120 tablet 1   ergocalciferol (VITAMIN D2) 1.25 MG (50000 UT) capsule Take 50,000 Units by mouth every Wednesday.     ferrous gluconate (FERGON) 324 MG tablet Take 1 tablet (324 mg total) by mouth 3 (three) times daily with meals. 90 tablet 0   fluticasone (FLONASE) 50 MCG/ACT nasal spray Place 2 sprays into the nose daily as needed. Allergies      levothyroxine (SYNTHROID) 100 MCG tablet Take 100 mcg by  mouth daily before breakfast.     loratadine (CLARITIN) 10 MG tablet Take 10 mg by mouth daily.     meclizine (ANTIVERT) 25 MG tablet Take 1 tablet (25 mg total) by mouth 3 (three) times daily as needed for dizziness. 30 tablet 0   ondansetron (ZOFRAN) 8 MG tablet Take 1 tablet (8 mg total) by mouth every 8 (eight) hours as needed. 30 tablet 0   prochlorperazine (COMPAZINE) 10 MG tablet Take 1 tablet (10 mg total) by mouth every 6 (six) hours as needed for nausea or vomiting. 30 tablet 0   senna-docusate (SENOKOT-S) 8.6-50 MG tablet Take 1 tablet by mouth at bedtime as needed for mild constipation.     vitamin B-12 (CYANOCOBALAMIN) 500 MCG tablet Take 1 tablet by mouth daily.     amiodarone (PACERONE) 200 MG tablet Take 1 tablet (200 mg total) by mouth 2 (two) times daily for 4 days. 8 tablet 0   metoprolol succinate (TOPROL-XL) 25 MG 24 hr tablet Take 1 tablet (25 mg total) by mouth daily. Take with or immediately following a meal. 30 tablet 3   Current Facility-Administered Medications  Medication Dose Route Frequency Provider Last Rate Last Admin   0.9 %  sodium chloride infusion  500 mL Intravenous Once Armbruster, Carlota Raspberry, MD        Allergies  Allergen Reactions   Zithromax [Azithromycin] Other (See Comments)    Social History   Socioeconomic History   Marital status: Married    Spouse name: Not on file   Number of children: Not on file   Years of education: Not on file   Highest education level: Not on file  Occupational History   Not on file  Tobacco Use   Smoking status: Former    Years: 20.00    Types: Cigarettes    Quit date: 10/01/1978    Years since quitting: 43.5   Smokeless tobacco: Never   Tobacco comments:    Former smoker 04/03/22  Substance and Sexual Activity   Alcohol use: Yes    Alcohol/week: 2.0 standard drinks of alcohol    Types: 2 Glasses of wine per week    Comment: occasionally   Drug use: No   Sexual activity: Not on file  Other Topics Concern    Not on file  Social History Narrative   Not on file   Social Determinants of Health   Financial Resource Strain: Not on file  Food Insecurity: Not on file  Transportation Needs: Not on file  Physical Activity: Not on file  Stress: Not on file  Social Connections: Not on file  Intimate Partner Violence: Not on file     ROS- All systems are reviewed and negative except as per the HPI above.  Physical Exam: Vitals:   04/03/22 1340  BP: 140/90  Pulse: (!) 136  Weight: 98.4 kg  Height: '6\' 5"'  (1.956 m)    GEN- The  patient is a well appearing elderly male, alert and oriented x 3 today.   Head- normocephalic, atraumatic Eyes-  Sclera clear, conjunctiva pink Ears- hearing intact Oropharynx- clear Neck- supple  Lungs- Clear to ausculation bilaterally, normal work of breathing Heart- irregular rate and rhythm, no murmurs, rubs or gallops  GI- soft, NT, ND, + BS Extremities- no clubbing, cyanosis, or edema MS- no significant deformity or atrophy Skin- no rash or lesion Psych- euthymic mood, full affect Neuro- strength and sensation are intact  Wt Readings from Last 3 Encounters:  04/03/22 98.4 kg  03/31/22 97.3 kg  03/24/22 97.7 kg    EKG today demonstrates  Afib with RVR Vent. rate 136 BPM PR interval * ms QRS duration 102 ms QT/QTcB 356/535 ms  Echo 03/12/22 demonstrated   1. Left ventricular ejection fraction, by estimation, is 40 to 45%. The  left ventricle has mildly decreased function. The left ventricle has no  regional wall motion abnormalities. There is mild concentric left  ventricular hypertrophy. Left ventricular diastolic parameters are consistent with Grade I diastolic dysfunction (impaired relaxation).   2. Right ventricular systolic function is normal. The right ventricular  size is normal. There is mildly elevated pulmonary artery systolic  pressure.   3. The mitral valve is normal in structure. Mild mitral valve  regurgitation. No evidence of  mitral stenosis.   4. The aortic valve is tricuspid. Aortic valve regurgitation is trivial.  Aortic valve sclerosis is present, with no evidence of aortic valve  stenosis.   5. The inferior vena cava is normal in size with greater than 50%  respiratory variability, suggesting right atrial pressure of 3 mmHg.   Epic records are reviewed at length today  CHA2DS2-VASc Score = 3  The patient's score is based upon: CHF History: 0 HTN History: 1 Diabetes History: 0 Stroke History: 0 Vascular Disease History: 0 Age Score: 2 Gender Score: 0       ASSESSMENT AND PLAN: 1. Persistent Atrial Fibrillation/atrial flutter The patient's CHA2DS2-VASc score is 3, indicating a 3.2% annual risk of stroke.   Patient back in rapid afib. Suspect he is now going in and out of rhythm (was in SR on 7/24). Will not pursue DCCV.  Continue amiodarone 200 mg daily. Hopefully he will have less and less afib as he continues to load on the medication.  Continue Eliquis 5 mg BID Resume Toprol at lower dose 25 mg daily  2. Secondary Hypercoagulable State (ICD10:  D68.69) The patient is at significant risk for stroke/thromboembolism based upon his CHA2DS2-VASc Score of 3.  Continue Apixaban (Eliquis).   3. HTN Stable, med changes as above.   4. Systolic dysfunction  EF 31-54% on TTE, 50-55% on TEE Appears euvolemic today.   Follow up in the AF clinic in 1-2 weeks and with with Dr Sallyanne Kuster in 2 months.    [facility] [address] [place], [place] [address] [phone] 04/03/2022 2:59 PM

## 2022-04-03 NOTE — Patient Instructions (Signed)
Metoprolol 25 mg once a day

## 2022-04-04 ENCOUNTER — Other Ambulatory Visit: Payer: Self-pay

## 2022-04-04 ENCOUNTER — Other Ambulatory Visit (HOSPITAL_COMMUNITY): Payer: Self-pay | Admitting: *Deleted

## 2022-04-04 ENCOUNTER — Ambulatory Visit (INDEPENDENT_AMBULATORY_CARE_PROVIDER_SITE_OTHER): Payer: No Typology Code available for payment source | Admitting: Infectious Diseases

## 2022-04-04 ENCOUNTER — Encounter: Payer: Self-pay | Admitting: Infectious Diseases

## 2022-04-04 VITALS — BP 145/87 | HR 88 | Temp 97.5°F | Wt 217.0 lb

## 2022-04-04 DIAGNOSIS — K7689 Other specified diseases of liver: Secondary | ICD-10-CM

## 2022-04-04 DIAGNOSIS — R509 Fever, unspecified: Secondary | ICD-10-CM

## 2022-04-04 LAB — MULTIPLE MYELOMA PANEL, SERUM
Albumin SerPl Elph-Mcnc: 3.3 g/dL (ref 2.9–4.4)
Albumin/Glob SerPl: 1.2 (ref 0.7–1.7)
Alpha 1: 0.3 g/dL (ref 0.0–0.4)
Alpha2 Glob SerPl Elph-Mcnc: 0.8 g/dL (ref 0.4–1.0)
B-Globulin SerPl Elph-Mcnc: 0.9 g/dL (ref 0.7–1.3)
Gamma Glob SerPl Elph-Mcnc: 0.7 g/dL (ref 0.4–1.8)
Globulin, Total: 2.8 g/dL (ref 2.2–3.9)
IgA: 247 mg/dL (ref 61–437)
IgG (Immunoglobin G), Serum: 575 mg/dL — ABNORMAL LOW (ref 603–1613)
IgM (Immunoglobulin M), Srm: 52 mg/dL (ref 15–143)
M Protein SerPl Elph-Mcnc: 0.1 g/dL — ABNORMAL HIGH
Total Protein ELP: 6.1 g/dL (ref 6.0–8.5)

## 2022-04-04 MED ORDER — AMIODARONE HCL 200 MG PO TABS
200.0000 mg | ORAL_TABLET | Freq: Every day | ORAL | 3 refills | Status: DC
Start: 2022-04-04 — End: 2022-08-13

## 2022-04-04 NOTE — Progress Notes (Addendum)
Patient Active Problem List   Diagnosis Date Noted   Secondary hypercoagulable state ([facility]) 04/03/2022   Liver cyst    Tachycardia induced cardiomyopathy ([facility])    Pneumonia due to infectious organism    Atrial fibrillation with RVR (HCC)    SIRS (systemic inflammatory response syndrome) (HCC)    Fever 03/09/2022   Multiple myeloma not having achieved remission ([facility]) 01/30/2022   Deficiency anemia 10/26/2014   Multiple myeloma ([facility]) 04/06/2012    Patient's Medications  New Prescriptions   No medications on file  Previous Medications   ACYCLOVIR (ZOVIRAX) 400 MG TABLET    Take 1 tablet (400 mg total) by mouth 2 (two) times daily.   ALLOPURINOL (ZYLOPRIM) 300 MG TABLET    Take 1 tablet (300 mg total) by mouth daily.   ALPRAZOLAM (XANAX) 0.25 MG TABLET    Take 0.25 mg by mouth daily as needed. Anxiety    AMIODARONE (PACERONE) 200 MG TABLET    Take 1 tablet (200 mg total) by mouth daily.   APIXABAN (ELIQUIS) 5 MG TABS TABLET    Take 1 tablet (5 mg total) by mouth 2 (two) times daily.   ATORVASTATIN (LIPITOR) 20 MG TABLET    Take 20 mg by mouth daily.   DEXAMETHASONE (DECADRON) 4 MG TABLET    Take 10 tablets (40 mg total) by mouth once a week. Take steroids pills the in the morning on chemotherapy days.   ERGOCALCIFEROL (VITAMIN D2) 1.25 MG (50000 UT) CAPSULE    Take 50,000 Units by mouth every Wednesday.   FERROUS GLUCONATE (FERGON) 324 MG TABLET    Take 1 tablet (324 mg total) by mouth 3 (three) times daily with meals.   FLUTICASONE (FLONASE) 50 MCG/ACT NASAL SPRAY    Place 2 sprays into the nose daily as needed. Allergies    LEVOTHYROXINE (SYNTHROID) 100 MCG TABLET    Take 100 mcg by mouth daily before breakfast.   LORATADINE (CLARITIN) 10 MG TABLET    Take 10 mg by mouth daily.   MECLIZINE (ANTIVERT) 25 MG TABLET    Take 1 tablet (25 mg total) by mouth 3 (three) times daily as needed for dizziness.   METOPROLOL SUCCINATE (TOPROL-XL) 25 MG 24 HR TABLET    Take 1 tablet (25 mg  total) by mouth daily. Take with or immediately following a meal.   ONDANSETRON (ZOFRAN) 8 MG TABLET    Take 1 tablet (8 mg total) by mouth every 8 (eight) hours as needed.   PROCHLORPERAZINE (COMPAZINE) 10 MG TABLET    Take 1 tablet (10 mg total) by mouth every 6 (six) hours as needed for nausea or vomiting.   SENNA-DOCUSATE (SENOKOT-S) 8.6-50 MG TABLET    Take 1 tablet by mouth at bedtime as needed for mild constipation.   VITAMIN B-12 (CYANOCOBALAMIN) 500 MCG TABLET    Take 1 tablet by mouth daily.  Modified Medications   No medications on file  Discontinued Medications   No medications on file    Subjective: Here for HFU in the setting of fevers and  CT abdomen pelvis with concerns for liver cyst, cannot exclude liver abscess. Accompanied by wife. Has been doing well since hospital discharge. Denies any fevers, chills. Denies nausea, vomiting, abdominal pain. Gets diarrhea for few days after getting chemotherapy. Appetite is good and no changes in weight. Discussed CT CAP findings done at the hospital and also less likely there to be a hepatic abscess. Patient and wife agree not to  pursue MRI abdomen for further evaluation of liver lesions to r/o abscess as clinical suspicion is low.   Recently seen by Oncology 03/24/22 and s/o cycle 2 D1 of CyBorD. Noted to have bradycardia during cli nic fu on 7/24 and Metoprolol was held but was later resumed at lower dose 25 mg po daily after clinic visit with Cardiology 7/26.   Review of Systems: all systems reviewed with pertinent positives and negatives as listed above   Past Medical History:  Diagnosis Date   Allergy    Anxiety    Arthritis    Depression    Heart murmur    Hyperlipidemia    Hyperplastic colon polyp    Hyperproteinemia    Hypertension    Hypothyroidism    Mitral valve prolapse    Paroxysmal atrial fibrillation (HCC)    Thyroid disease    Past Surgical History:  Procedure Laterality Date   CARDIOVERSION N/A 03/14/2022    Procedure: CARDIOVERSION;  Surgeon: Pixie Casino, MD;  Location: [facility];  Service: Cardiovascular;  Laterality: N/A;   TEE WITHOUT CARDIOVERSION N/A 03/14/2022   Procedure: TRANSESOPHAGEAL ECHOCARDIOGRAM (TEE);  Surgeon: Pixie Casino, MD;  Location: [facility] ENDOSCOPY;  Service: Cardiovascular;  Laterality: N/A;    Social History   Tobacco Use   Smoking status: Former    Years: 20.00    Types: Cigarettes    Quit date: 10/01/1978    Years since quitting: 43.5   Smokeless tobacco: Never   Tobacco comments:    Former smoker 04/03/22  Substance Use Topics   Alcohol use: Yes    Alcohol/week: 2.0 standard drinks of alcohol    Types: 2 Glasses of wine per week    Comment: occasionally   Drug use: No    Family History  Problem Relation Age of Onset   Cancer Father    Colon cancer Neg Hx    Esophageal cancer Neg Hx    Liver cancer Neg Hx    Pancreatic cancer Neg Hx    Rectal cancer Neg Hx    Stomach cancer Neg Hx     Allergies  Allergen Reactions   Zithromax [Azithromycin] Other (See Comments)    Health Maintenance  Topic Date Due   Pneumonia Vaccine 7+ Years old (1 - PCV) Never done   Hepatitis C Screening  Never done   COVID-19 Vaccine (3 - Moderna risk series) 08/18/2020   INFLUENZA VACCINE  04/08/2022   TETANUS/TDAP  02/15/2030   Zoster Vaccines- Shingrix  Completed   HPV VACCINES  Aged Out   COLONOSCOPY (Pts 45-53yr Insurance coverage will need to be confirmed)  Discontinued    Objective: BP (!) 145/87   Pulse 88   Temp (!) 97.5 F (36.4 C) (Oral)   Wt 217 lb (98.4 kg)   BMI 25.73 kg/m   Physical Exam Constitutional:      Appearance: Normal appearance.  HENT:     Head: Normocephalic and atraumatic.      Mouth: Mucous membranes are moist.  Eyes:    Conjunctiva/sclera: Conjunctivae normal.     Pupils:   Cardiovascular:     Rate and Rhythm: Normal rate and regular rhythm.     Heart sounds:   Pulmonary:     Effort: Pulmonary effort is normal.      Breath sounds: Normal breath sounds.   Abdominal:     General: Non distended     Palpations: soft, non tender   Musculoskeletal:  General: Normal range of motion.   Skin:    General: Skin is warm and dry.     Comments:  Neurological:     General: grossly non focal     Mental Status: awake, alert and oriented to person, place, and time.   Psychiatric:        Mood and Affect: Mood normal.   Lab Results Lab Results  Component Value Date   WBC 3.7 (L) 03/31/2022   HGB 8.8 (L) 03/31/2022   HCT 27.9 (L) 03/31/2022   MCV 103.3 (H) 03/31/2022   PLT 186 03/31/2022    Lab Results  Component Value Date   CREATININE 1.81 (H) 03/31/2022   BUN 12 03/31/2022   NA 140 03/31/2022   K 4.2 03/31/2022   CL 111 03/31/2022   CO2 23 03/31/2022    Lab Results  Component Value Date   ALT 14 03/31/2022   AST 16 03/31/2022   ALKPHOS 70 03/31/2022   BILITOT 0.5 03/31/2022    Lab Results  Component Value Date   CHOL  08/31/2008    160        ATP III CLASSIFICATION:  <200     mg/dL   Desirable  200-239  mg/dL   Borderline High  >=240    mg/dL   High   HDL 48 08/31/2008   LDLCALC (H) 08/31/2008    102        Total Cholesterol/HDL:CHD Risk Coronary Heart Disease Risk Table                     Men   Women  1/2 Average Risk   3.4   3.3   TRIG 49 08/31/2008   CHOLHDL 3.3 08/31/2008   No results found for: "LABRPR", "RPRTITER" No results found for: "HIV1RNAQUANT", "HIV1RNAVL", "CD4TABS"   Problem List Items Addressed This Visit       Digestive   Liver cyst - Primary     Other   Fever   Assessment/Plan  #  CT CAP 7/7 with concerns for hepatic cyst, liver abscess not entirely excluded but felt less likely.   - Do not think liver cyst needs further evaluation by MRI abdomen as low clinical concerns at this time. Patient and wife agrees on holding off MRI  - Fu as needed.    # Multiple myeloma-following oncology outpatient, on CyBorD chemotherapy ( Cycle 2 Day 1 of  CyBorD 03/24/22)  # Persistent A Fibrillation/HTN/Systolic dysfunction- post cardioversion by cardiology. On AC, amiodarone, metoprolol. Following Cardiology  I have personally spent 41 minutes involved in face-to-face and non-face-to-face activities for this patient on the day of the visit. Professional time spent includes the following activities: Preparing to see the patient (review of tests), Obtaining and/or reviewing separately obtained history (admission/discharge record), Performing a medically appropriate examination and/or evaluation , Ordering medications/tests/procedures, referring and communicating with other health care professionals, Documenting clinical information in the EMR, Independently interpreting results (not separately reported), Communicating results to the patient/family/caregiver, Counseling and educating the patient/family/caregiver and Care coordination (not separately reported).   Wilber Oliphant, [facility] 04/04/2022, 11:13 AM

## 2022-04-05 ENCOUNTER — Other Ambulatory Visit: Payer: Self-pay

## 2022-04-07 ENCOUNTER — Inpatient Hospital Stay: Payer: No Typology Code available for payment source

## 2022-04-07 ENCOUNTER — Other Ambulatory Visit: Payer: Self-pay

## 2022-04-07 ENCOUNTER — Inpatient Hospital Stay (HOSPITAL_BASED_OUTPATIENT_CLINIC_OR_DEPARTMENT_OTHER): Payer: No Typology Code available for payment source | Admitting: Hematology and Oncology

## 2022-04-07 VITALS — BP 153/80 | HR 66 | Temp 98.6°F | Resp 16 | Wt 217.8 lb

## 2022-04-07 DIAGNOSIS — D631 Anemia in chronic kidney disease: Secondary | ICD-10-CM

## 2022-04-07 DIAGNOSIS — C9000 Multiple myeloma not having achieved remission: Secondary | ICD-10-CM

## 2022-04-07 DIAGNOSIS — Z5112 Encounter for antineoplastic immunotherapy: Secondary | ICD-10-CM | POA: Diagnosis not present

## 2022-04-07 DIAGNOSIS — N184 Chronic kidney disease, stage 4 (severe): Secondary | ICD-10-CM | POA: Diagnosis not present

## 2022-04-07 LAB — CBC WITH DIFFERENTIAL (CANCER CENTER ONLY)
Abs Immature Granulocytes: 0.02 10*3/uL (ref 0.00–0.07)
Basophils Absolute: 0 10*3/uL (ref 0.0–0.1)
Basophils Relative: 0 %
Eosinophils Absolute: 0.1 10*3/uL (ref 0.0–0.5)
Eosinophils Relative: 3 %
HCT: 26.3 % — ABNORMAL LOW (ref 39.0–52.0)
Hemoglobin: 8.5 g/dL — ABNORMAL LOW (ref 13.0–17.0)
Immature Granulocytes: 1 %
Lymphocytes Relative: 10 %
Lymphs Abs: 0.3 10*3/uL — ABNORMAL LOW (ref 0.7–4.0)
MCH: 33.2 pg (ref 26.0–34.0)
MCHC: 32.3 g/dL (ref 30.0–36.0)
MCV: 102.7 fL — ABNORMAL HIGH (ref 80.0–100.0)
Monocytes Absolute: 0.4 10*3/uL (ref 0.1–1.0)
Monocytes Relative: 11 %
Neutro Abs: 2.4 10*3/uL (ref 1.7–7.7)
Neutrophils Relative %: 75 %
Platelet Count: 132 10*3/uL — ABNORMAL LOW (ref 150–400)
RBC: 2.56 MIL/uL — ABNORMAL LOW (ref 4.22–5.81)
RDW: 17.7 % — ABNORMAL HIGH (ref 11.5–15.5)
WBC Count: 3.2 10*3/uL — ABNORMAL LOW (ref 4.0–10.5)
nRBC: 0.6 % — ABNORMAL HIGH (ref 0.0–0.2)

## 2022-04-07 LAB — CMP (CANCER CENTER ONLY)
ALT: 10 U/L (ref 0–44)
AST: 15 U/L (ref 15–41)
Albumin: 3.6 g/dL (ref 3.5–5.0)
Alkaline Phosphatase: 69 U/L (ref 38–126)
Anion gap: 7 (ref 5–15)
BUN: 12 mg/dL (ref 8–23)
CO2: 24 mmol/L (ref 22–32)
Calcium: 8.3 mg/dL — ABNORMAL LOW (ref 8.9–10.3)
Chloride: 108 mmol/L (ref 98–111)
Creatinine: 1.85 mg/dL — ABNORMAL HIGH (ref 0.61–1.24)
GFR, Estimated: 37 mL/min — ABNORMAL LOW (ref >60)
Glucose, Bld: 112 mg/dL — ABNORMAL HIGH (ref 70–99)
Potassium: 3.8 mmol/L (ref 3.5–5.1)
Sodium: 139 mmol/L (ref 135–145)
Total Bilirubin: 0.6 mg/dL (ref 0.3–1.2)
Total Protein: 6.1 g/dL — ABNORMAL LOW (ref 6.5–8.1)

## 2022-04-07 LAB — LACTATE DEHYDROGENASE: LDH: 188 U/L (ref 98–192)

## 2022-04-07 MED ORDER — BORTEZOMIB CHEMO SQ INJECTION 3.5 MG (2.5MG/ML)
1.5000 mg/m2 | Freq: Once | INTRAMUSCULAR | Status: AC
  Administered 2022-04-07: 3.5 mg via SUBCUTANEOUS
  Filled 2022-04-07: qty 1.4

## 2022-04-07 MED ORDER — SODIUM CHLORIDE 0.9 % IV SOLN: Freq: Once | INTRAVENOUS | Status: AC

## 2022-04-07 MED ORDER — SODIUM CHLORIDE 0.9 % IV SOLN
300.0000 mg/m2 | Freq: Once | INTRAVENOUS | Status: AC
  Administered 2022-04-07: 700 mg via INTRAVENOUS
  Filled 2022-04-07: qty 35

## 2022-04-07 MED ORDER — PALONOSETRON HCL INJECTION 0.25 MG/5ML
0.2500 mg | Freq: Once | INTRAVENOUS | Status: AC
  Administered 2022-04-07: 0.25 mg via INTRAVENOUS
  Filled 2022-04-07: qty 5

## 2022-04-07 NOTE — Patient Instructions (Signed)
[facility]   Discharge Instructions: Thank you for choosing [facility] to provide your oncology and hematology care.   If you have a lab appointment with the [facility], please go directly to the [facility] and check in at the registration area.   Wear comfortable clothing and clothing appropriate for easy access to any Portacath or PICC line.   We strive to give you quality time with your provider. You may need to reschedule your appointment if you arrive late (15 or more minutes).  Arriving late affects you and other patients whose appointments are after yours.  Also, if you miss three or more appointments without notifying the office, you may be dismissed from the clinic at the provider's discretion.      For prescription refill requests, have your pharmacy contact our office and allow 72 hours for refills to be completed.    Today you received the following chemotherapy and/or immunotherapy agents: cyclophosphamide and bortezomib      To help prevent nausea and vomiting after your treatment, we encourage you to take your nausea medication as directed.  BELOW ARE SYMPTOMS THAT SHOULD BE REPORTED IMMEDIATELY: *FEVER GREATER THAN 100.4 F (38 C) OR HIGHER *CHILLS OR SWEATING *NAUSEA AND VOMITING THAT IS NOT CONTROLLED WITH YOUR NAUSEA MEDICATION *UNUSUAL SHORTNESS OF BREATH *UNUSUAL BRUISING OR BLEEDING *URINARY PROBLEMS (pain or burning when urinating, or frequent urination) *BOWEL PROBLEMS (unusual diarrhea, constipation, pain near the anus) TENDERNESS IN MOUTH AND THROAT WITH OR WITHOUT PRESENCE OF ULCERS (sore throat, sores in mouth, or a toothache) UNUSUAL RASH, SWELLING OR PAIN  UNUSUAL VAGINAL DISCHARGE OR ITCHING   Items with * indicate a potential emergency and should be followed up as soon as possible or go to the Emergency Department if any problems should occur.  Please show the CHEMOTHERAPY ALERT CARD or IMMUNOTHERAPY  ALERT CARD at check-in to the Emergency Department and triage nurse.  Should you have questions after your visit or need to cancel or reschedule your appointment, please contact [facility]  Dept: [phone]  and follow the prompts.  Office hours are 8:00 a.m. to 4:30 p.m. Monday - Friday. Please note that voicemails left after 4:00 p.m. may not be returned until the following business day.  We are closed weekends and major holidays. You have access to a nurse at all times for urgent questions. Please call the main number to the clinic Dept: [phone] and follow the prompts.   For any non-urgent questions, you may also contact your provider using MyChart. We now offer e-Visits for anyone 40 and older to request care online for non-urgent symptoms. For details visit [email or web address].   Also download the MyChart app! Go to the app store, search "MyChart", open the app, select , and log in with your MyChart username and password.  Masks are optional in the cancer centers. If you would like for your care team to wear a mask while they are taking care of you, please let them know. For doctor visits, patients may have with them one support person who is at least 77 years old. At this time, visitors are not allowed in the infusion area.

## 2022-04-07 NOTE — Progress Notes (Signed)
Ok to treat today with Scr 1.85 per Dr [name]. Patient reports he took PO steroids at home prior to arrival.

## 2022-04-07 NOTE — Progress Notes (Signed)
[place] Telephone:([phone]) [phone]   [phone]) [phone]  PROGRESS NOTE  Patient Care Team: [name], MD as PCP - General (Internal Medicine)  Hematological/Oncological History # IgA Lambda Multiple Myeloma 05/03/2015: last visit with Dr. Julien Nordmann at the [facility]. Was followed for IgA lambda MGUS. 12/11/2021: labs show M protein 3.4, Kappa 19.2, Lambda 1576.4, ratio 0.01. Cr 3.47, Hgb 8.6, WBC 5.7, MCV 99, Plt 221 12/23/2021: establish care with Dr. [name]  01/16/2022: Bone marrow biopsy performed, showed a 60% cellular bone marrow predominantly comprised of plasma cells making up 70 to 80%, lambda restricted.  Myeloma FISH panel showed no evidence of abnormalities. 02/07/2022: Cycle 1 Day 1 of CyBorD chemotherapy.  03/09/2022-03/17/2022: hospitalized for fever/pneumonia.  03/24/2022: Cycle 2 Day 1 of CyBorD chemotherapy.   Interval History:  Darren Hinton. 76 y.o. male with medical history significant for IgA lambda multiple myeloma who presents for a follow up visit. The patient's last visit was on 03/24/2022. In the interim since the last visit he has had no major changes in his health.   On exam today Darren Hinton notes that he is feeling much better than he was previously.  He did have an episode of low heart rate last week and fortunately he met with his cardiologist to decrease his dose of metoprolol.  He notes he is feeling quite good.  He is not having any issues with lightheadedness or dizziness.  He not having any palpitations.  Overall he is tolerating therapy quite well with no major side effects.  He notes he does have some occasional loose stools but he is "adjusting to the chemo".  He also takes Imodium when his bowels are running too loose.  He is not having any nausea or vomiting.  He also is not having any numbness or tingling of his fingers or toes.  He is keeping good track of his medications and taking his steroids and antifungals medication at home.   He notes he has his usual chronic pains but no pains that are new since starting treatment.  He reports that he is not having any issues with fevers, chills, sweats, nausea, vomiting or constipation.  A full 10 point ROS is otherwise negative.  MEDICAL HISTORY:  Past Medical History:  Diagnosis Date   Allergy    Anxiety    Arthritis    Depression    Heart murmur    Hyperlipidemia    Hyperplastic colon polyp    Hyperproteinemia    Hypertension    Hypothyroidism    Mitral valve prolapse    Paroxysmal atrial fibrillation (HCC)    Thyroid disease     SURGICAL HISTORY: Past Surgical History:  Procedure Laterality Date   CARDIOVERSION N/A 03/14/2022   Procedure: CARDIOVERSION;  Surgeon: Pixie Casino, MD;  Location: [place];  Service: Cardiovascular;  Laterality: N/A;   TEE WITHOUT CARDIOVERSION N/A 03/14/2022   Procedure: TRANSESOPHAGEAL ECHOCARDIOGRAM (TEE);  Surgeon: Pixie Casino, MD;  Location: [facility] ENDOSCOPY;  Service: Cardiovascular;  Laterality: N/A;    SOCIAL HISTORY: Social History   Socioeconomic History   Marital status: Married    Spouse name: Not on file   Number of children: Not on file   Years of education: Not on file   Highest education level: Not on file  Occupational History   Not on file  Tobacco Use   Smoking status: Former    Years: 20.00    Types: Cigarettes    Quit date: 10/01/1978  Years since quitting: 43.5   Smokeless tobacco: Never   Tobacco comments:    Former smoker 04/03/22  Substance and Sexual Activity   Alcohol use: Yes    Alcohol/week: 2.0 standard drinks of alcohol    Types: 2 Glasses of wine per week    Comment: occasionally   Drug use: No   Sexual activity: Not on file  Other Topics Concern   Not on file  Social History Narrative   Not on file   Social Determinants of Health   Financial Resource Strain: Not on file  Food Insecurity: Not on file  Transportation Needs: Not on file  Physical Activity: Not on file   Stress: Not on file  Social Connections: Not on file  Intimate Partner Violence: Not on file    FAMILY HISTORY: Family History  Problem Relation Age of Onset   Cancer Father    Colon cancer Neg Hx    Esophageal cancer Neg Hx    Liver cancer Neg Hx    Pancreatic cancer Neg Hx    Rectal cancer Neg Hx    Stomach cancer Neg Hx     ALLERGIES:  is allergic to zithromax [azithromycin].  MEDICATIONS:  Current Outpatient Medications  Medication Sig Dispense Refill   acyclovir (ZOVIRAX) 400 MG tablet Take 1 tablet (400 mg total) by mouth 2 (two) times daily. 180 tablet 1   allopurinol (ZYLOPRIM) 300 MG tablet Take 1 tablet (300 mg total) by mouth daily. 90 tablet 1   ALPRAZolam (XANAX) 0.25 MG tablet Take 0.25 mg by mouth daily as needed. Anxiety      amiodarone (PACERONE) 200 MG tablet Take 1 tablet (200 mg total) by mouth daily. 30 tablet 3   apixaban (ELIQUIS) 5 MG TABS tablet Take 1 tablet (5 mg total) by mouth 2 (two) times daily. 60 tablet 0   atorvastatin (LIPITOR) 20 MG tablet Take 20 mg by mouth daily.     dexamethasone (DECADRON) 4 MG tablet Take 10 tablets (40 mg total) by mouth once a week. Take steroids pills the in the morning on chemotherapy days. 120 tablet 1   ergocalciferol (VITAMIN D2) 1.25 MG (50000 UT) capsule Take 50,000 Units by mouth every Wednesday.     ferrous gluconate (FERGON) 324 MG tablet Take 1 tablet (324 mg total) by mouth 3 (three) times daily with meals. 90 tablet 0   fluticasone (FLONASE) 50 MCG/ACT nasal spray Place 2 sprays into the nose daily as needed. Allergies      levothyroxine (SYNTHROID) 100 MCG tablet Take 100 mcg by mouth daily before breakfast.     loratadine (CLARITIN) 10 MG tablet Take 10 mg by mouth daily.     meclizine (ANTIVERT) 25 MG tablet Take 1 tablet (25 mg total) by mouth 3 (three) times daily as needed for dizziness. 30 tablet 0   metoprolol succinate (TOPROL-XL) 25 MG 24 hr tablet Take 1 tablet (25 mg total) by mouth daily.  Take with or immediately following a meal. 30 tablet 3   ondansetron (ZOFRAN) 8 MG tablet Take 1 tablet (8 mg total) by mouth every 8 (eight) hours as needed. 30 tablet 0   prochlorperazine (COMPAZINE) 10 MG tablet Take 1 tablet (10 mg total) by mouth every 6 (six) hours as needed for nausea or vomiting. 30 tablet 0   senna-docusate (SENOKOT-S) 8.6-50 MG tablet Take 1 tablet by mouth at bedtime as needed for mild constipation.     vitamin B-12 (CYANOCOBALAMIN) 500 MCG tablet Take 1 tablet  by mouth daily.     Current Facility-Administered Medications  Medication Dose Route Frequency Provider Last Rate Last Admin   0.9 %  sodium chloride infusion  500 mL Intravenous Once Armbruster, Carlota Raspberry, MD        REVIEW OF SYSTEMS:   Constitutional: ( - ) fevers, ( - )  chills , ( - ) night sweats Eyes: ( - ) blurriness of vision, ( - ) double vision, ( - ) watery eyes Ears, nose, mouth, throat, and face: ( - ) mucositis, ( - ) sore throat Respiratory: ( - ) cough, ( - ) dyspnea, ( - ) wheezes Cardiovascular: ( - ) palpitation, ( - ) chest discomfort, ( - ) lower extremity swelling Gastrointestinal:  ( - ) nausea, ( - ) heartburn, ( - ) change in bowel habits Skin: ( - ) abnormal skin rashes Lymphatics: ( - ) new lymphadenopathy, ( - ) easy bruising Neurological: ( - ) numbness, ( - ) tingling, ( - ) new weaknesses Behavioral/Psych: ( - ) mood change, ( - ) new changes  All other systems were reviewed with the patient and are negative.  PHYSICAL EXAMINATION: ECOG PERFORMANCE STATUS: 1 - Symptomatic but completely ambulatory  Vitals:   04/07/22 0848  BP: (!) 153/80  Pulse: 66  Resp: 16  Temp: 98.6 F (37 C)  SpO2: 100%     Filed Weights   04/07/22 0848  Weight: 217 lb 12.8 oz (98.8 kg)      GENERAL: Chronically ill-appearing elderly African-American male, alert, no distress and comfortable SKIN: skin color, texture, turgor are normal, no rashes or significant lesions EYES:  conjunctiva are pink and non-injected, sclera clear LUNGS: clear to auscultation and percussion with normal breathing effort HEART: regular rate & rhythm and no murmurs and no lower extremity edema ABDOMEN: soft, non-tender, non-distended, normal bowel sounds Musculoskeletal: no cyanosis of digits and no clubbing  PSYCH: alert & oriented x 3, fluent speech NEURO: no focal motor/sensory deficits  LABORATORY DATA:  I have reviewed the data as listed    Latest Ref Rng & Units 04/07/2022    8:12 AM 03/31/2022    9:33 AM 03/24/2022   10:15 AM  CBC  WBC 4.0 - 10.5 K/uL 3.2  3.7  5.0   Hemoglobin 13.0 - 17.0 g/dL 8.5  8.8  8.5   Hematocrit 39.0 - 52.0 % 26.3  27.9  27.4   Platelets 150 - 400 K/uL 132  186  397        Latest Ref Rng & Units 04/07/2022    8:12 AM 03/31/2022    9:33 AM 03/24/2022   10:15 AM  CMP  Glucose 70 - 99 mg/dL 112  97  122   BUN 8 - 23 mg/dL $Remove'12  12  11   'NxRjCqO$ Creatinine 0.61 - 1.24 mg/dL 1.85  1.81  2.03   Sodium 135 - 145 mmol/L 139  140  138   Potassium 3.5 - 5.1 mmol/L 3.8  4.2  4.2   Chloride 98 - 111 mmol/L 108  111  107   CO2 22 - 32 mmol/L $RemoveB'24  23  24   'cTGiqGGo$ Calcium 8.9 - 10.3 mg/dL 8.3  8.9  8.6   Total Protein 6.5 - 8.1 g/dL 6.1  6.4  6.7   Total Bilirubin 0.3 - 1.2 mg/dL 0.6  0.5  0.4   Alkaline Phos 38 - 126 U/L 69  70  71   AST 15 - 41 U/L 15  16  30   ALT 0 - 44 U/L $Remo'10  14  30     'vsDGs$ Lab Results  Component Value Date   MPROTEIN 0.1 (H) 03/31/2022   MPROTEIN 0.7 (H) 03/03/2022   MPROTEIN 2.4 (H) 02/07/2022   Lab Results  Component Value Date   KPAFRELGTCHN 16.2 03/31/2022   KPAFRELGTCHN 14.1 03/03/2022   KPAFRELGTCHN 15.9 02/07/2022   LAMBDASER 8.4 03/31/2022   LAMBDASER 15.8 03/03/2022   LAMBDASER 1,590.2 (H) 02/07/2022   KAPLAMBRATIO 1.93 (H) 03/31/2022   KAPLAMBRATIO 0.89 03/03/2022   KAPLAMBRATIO 0.01 (L) 02/07/2022    RADIOGRAPHIC STUDIES: CT CHEST ABDOMEN PELVIS WO CONTRAST  Result Date: 03/14/2022 CLINICAL DATA:  Sepsis r/o occult abscess  History of multiple myeloma. EXAM: CT CHEST, ABDOMEN AND PELVIS WITHOUT CONTRAST TECHNIQUE: Multidetector CT imaging of the chest, abdomen and pelvis was performed following the standard protocol without IV contrast. RADIATION DOSE REDUCTION: This exam was performed according to the departmental dose-optimization program which includes automated exposure control, adjustment of the mA and/or kV according to patient size and/or use of iterative reconstruction technique. COMPARISON:  Chest radiograph 03/12/2022. FINDINGS: CT CHEST FINDINGS Cardiovascular: Heart size upper normal. Tortuous thoracic aorta. Coronary artery calcifications. Trace pericardial effusion. Mediastinum/Nodes: Scattered small mediastinal lymph nodes not enlarged by size criteria. Small amount of enteric contrast within the mid esophagus. No associated wall thickening. No thyroid nodule. Lungs/Pleura: There are small bilateral pleural effusions and atelectasis in the lower lobes. Streaky opacities within both lower lobes also favor atelectasis. No pulmonary mass. No endobronchial debris or lesion. Occasional areas of septal thickening. Minimal apical predominant emphysema. Musculoskeletal: There are multiple lytic lesions consistent with history of multiple myeloma. Largest lesions are in the sternal manubrium and left scapula. There are multiple lesions throughout the thoracic spine. No pathologic compression fracture. No evidence of epidural tumor extension. Chest wall soft tissues are unremarkable. CT ABDOMEN PELVIS FINDINGS Hepatobiliary: 14 mm water density lesion in the left hepatic lobe series 2, image 55. Additional subcentimeter hypodensity in the right and left hepatic lobe Gallbladder physiologically distended, no calcified stone. No biliary dilatation. Pancreas: No ductal dilatation or inflammation. Spleen: Normal in size without focal abnormality. Adrenals/Urinary Tract: Normal adrenal glands. Lobulated bilateral renal contours suggest  scarring. No significant perinephric edema. No renal calculi. 11 mm cyst in the lower right kidney, needs no further follow-up. No evidence of perinephric collection. Unremarkable urinary bladder. Stomach/Bowel: Small duodenal diverticulum stomach is unremarkable. No bowel obstruction or inflammation. Normal appendix. Minimal sigmoid diverticulosis. No diverticulitis or acute colonic inflammation. Vascular/Lymphatic: Mild aorto bi-iliac atherosclerosis. No abdominopelvic adenopathy. Reproductive: Prostate is unremarkable. Other: No free air. No free fluid. No abdominopelvic collection. Slight subcutaneous edema in the flanks. Small bilateral fat containing inguinal hernias. Musculoskeletal: Multiple lytic lesions in the lumbar spine consistent with known myeloma. No pathologic compression fracture. Scattered lytic lesions in the pelvis. No evident lesion in the proximal for more to place the patient at risk of pathologic femur fracture. IMPRESSION: 1. Three hypodense liver lesions measures simple fluid density are likely cysts, although there are no prior exams to establish stability. The largest measures 14 mm, 2 additional lesions are subcentimeter. The possibility of hepatic abscess is not entirely excluded on this unenhanced exam, but felt less likely. Consider further assessment with hepatic MRI as clinically indicated. 2. Small bilateral pleural effusions with compressive and streaky atelectasis in the lower lobes. Occasional areas of septal thickening, can be seen with pulmonary edema. 3. Innumerable lytic lesions throughout the skeleton consistent  with known myeloma. No evidence of pathologic fracture. 4. Mild colonic diverticulosis without diverticulitis. Aortic Atherosclerosis (ICD10-I70.0) and Emphysema (ICD10-J43.9). Electronically Signed   By: Keith Rake M.D.   On: 03/14/2022 22:11   ECHO TEE  Result Date: 03/14/2022    TRANSESOPHOGEAL ECHO REPORT   Patient Name:   Darren Hinton. Date of  Exam: 03/14/2022 Medical Rec #:  [id number]           Height:       77.0 in Accession #:    [id number]          Weight:       217.6 lb Date of Birth:  12/20/1945          BSA:          2.318 m Patient Age:    43 years            BP:           111/81 mmHg Patient Gender: M                   HR:           90 bpm. Exam Location:  Inpatient Procedure: Transesophageal Echo, Cardiac Doppler and Color Doppler Indications:     A flutter  History:         Patient has prior history of Echocardiogram examinations, most                  recent 03/12/2022. Mitral Valve Disease, Arrythmias:Atrial                  Fibrillation; Risk Factors:Dyslipidemia and Hypertension.                  Hypothyroidism.  Sonographer:     Eartha Inch Referring Phys:  [id number] HAO MENG Diagnosing Phys: Lyman Bishop MD PROCEDURE: After discussion of the risks and benefits of a TEE, an informed consent was obtained from the patient. TEE procedure time was 8 minutes. The transesophogeal probe was passed without difficulty through the esophogus of the patient. Imaged were  obtained with the patient in a supine position. Sedation performed by different physician. Image quality was good. The patient developed no complications during the procedure. 1. Cardioverted 4 time(s). 2. Cardioverted at 120J, 150J and 200J x 2 biphasic. Converted to sinus after the initial shock, then back in afib- converted to sinus again for a few seconds with 200J, then back in afib. IMPRESSIONS  1. Left ventricular ejection fraction, by estimation, is 50 to 55%. The left ventricle has low normal function. There is mild left ventricular hypertrophy.  2. Right ventricular systolic function is normal. The right ventricular size is normal.  3. No left atrial/left atrial appendage thrombus was detected.  4. The mitral valve is grossly normal. Mild mitral valve regurgitation.  5. The aortic valve is tricuspid. Aortic valve regurgitation is not visualized.  6. 1. Cardioverted 4  time(s).     2. Cardioverted at 120J, 150J and 200J x 2 biphasic. Converted to sinus after the initial shock, then back in afib- converted to sinus again for a few seconds with 200J, then back in afib. Conclusion(s)/Recommendation(s): No evidence of vegetation/infective endocarditis on this transesophageael echocardiogram. No LA/LAA thrombus identified. Unsuccessful cardioversion performed without restoration of normal sinus rhythm. FINDINGS  Left Ventricle: Left ventricular ejection fraction, by estimation, is 50 to 55%. The left ventricle has low normal function. The left ventricular internal cavity size was normal in  size. There is mild left ventricular hypertrophy. Right Ventricle: The right ventricular size is normal. No increase in right ventricular wall thickness. Right ventricular systolic function is normal. Left Atrium: Left atrial size was normal in size. No left atrial/left atrial appendage thrombus was detected. Right Atrium: Right atrial size was normal in size. Pericardium: There is no evidence of pericardial effusion. Mitral Valve: The mitral valve is grossly normal. Mild mitral valve regurgitation, with multiple jets. Tricuspid Valve: The tricuspid valve is grossly normal. Tricuspid valve regurgitation is trivial. Aortic Valve: The aortic valve is tricuspid. Aortic valve regurgitation is not visualized. Pulmonic Valve: The pulmonic valve was normal in structure. Pulmonic valve regurgitation is trivial. Aorta: The aortic root and ascending aorta are structurally normal, with no evidence of dilitation. IAS/Shunts: No atrial level shunt detected by color flow Doppler.   AORTA Ao Asc diam: 3.90 cm Lyman Bishop MD Electronically signed by Lyman Bishop MD Signature Date/Time: 03/14/2022/11:53:24 AM    Final (Updated)    ECHOCARDIOGRAM COMPLETE  Result Date: 03/12/2022    ECHOCARDIOGRAM REPORT   Patient Name:   Darren Hinton. Date of Exam: 03/12/2022 Medical Rec #:  [id number]           Height:        77.0 in Accession #:    [id number]          Weight:       217.6 lb Date of Birth:  06/28/1946          BSA:          2.318 m Patient Age:    2 years            BP:           108/84 mmHg Patient Gender: M                   HR:           128 bpm. Exam Location:  Inpatient Procedure: 2D Echo, Cardiac Doppler, Color Doppler and Intracardiac            Opacification Agent Indications:    Atrial Flutter I48.92  History:        Patient has no prior history of Echocardiogram examinations.                 Risk Factors:Hypertension and Dyslipidemia. Hypothyroidism.                 Fever due to pneumonia. Chronic kidney disease, anemia.  Sonographer:    Darlina Sicilian RDCS Referring Phys: [id number] [place]  1. Left ventricular ejection fraction, by estimation, is 40 to 45%. The left ventricle has mildly decreased function. The left ventricle has no regional wall motion abnormalities. There is mild concentric left ventricular hypertrophy. Left ventricular diastolic parameters are consistent with Grade I diastolic dysfunction (impaired relaxation).  2. Right ventricular systolic function is normal. The right ventricular size is normal. There is mildly elevated pulmonary artery systolic pressure.  3. The mitral valve is normal in structure. Mild mitral valve regurgitation. No evidence of mitral stenosis.  4. The aortic valve is tricuspid. Aortic valve regurgitation is trivial. Aortic valve sclerosis is present, with no evidence of aortic valve stenosis.  5. The inferior vena cava is normal in size with greater than 50% respiratory variability, suggesting right atrial pressure of 3 mmHg. FINDINGS  Left Ventricle: Left ventricular ejection fraction, by estimation, is 40 to 45%. The left ventricle has mildly  decreased function. The left ventricle has no regional wall motion abnormalities. Definity contrast agent was given IV to delineate the left ventricular endocardial borders. The left ventricular internal  cavity size was normal in size. There is mild concentric left ventricular hypertrophy. Left ventricular diastolic parameters are consistent with Grade I diastolic dysfunction (impaired relaxation). Right Ventricle: The right ventricular size is normal. No increase in right ventricular wall thickness. Right ventricular systolic function is normal. There is mildly elevated pulmonary artery systolic pressure. The tricuspid regurgitant velocity is 2.66  m/s, and with an assumed right atrial pressure of 8 mmHg, the estimated right ventricular systolic pressure is 33.8 mmHg. Left Atrium: Left atrial size was normal in size. Right Atrium: Right atrial size was normal in size. Pericardium: There is no evidence of pericardial effusion. Mitral Valve: The mitral valve is normal in structure. Mild mitral valve regurgitation. No evidence of mitral valve stenosis. Tricuspid Valve: The tricuspid valve is normal in structure. Tricuspid valve regurgitation is not demonstrated. No evidence of tricuspid stenosis. Aortic Valve: The aortic valve is tricuspid. Aortic valve regurgitation is trivial. Aortic valve sclerosis is present, with no evidence of aortic valve stenosis. Pulmonic Valve: The pulmonic valve was normal in structure. Pulmonic valve regurgitation is not visualized. No evidence of pulmonic stenosis. Aorta: The aortic root is normal in size and structure. Venous: The inferior vena cava is normal in size with greater than 50% respiratory variability, suggesting right atrial pressure of 3 mmHg. IAS/Shunts: No atrial level shunt detected by color flow Doppler.  LEFT VENTRICLE PLAX 2D LVIDd:         4.60 cm      Diastology LVIDs:         3.30 cm      LV e' medial:    7.94 cm/s LV PW:         1.20 cm      LV E/e' medial:  13.1 LV IVS:        1.30 cm      LV e' lateral:   12.85 cm/s LVOT diam:     2.10 cm      LV E/e' lateral: 8.1 LV SV:         38 LV SV Index:   16 LVOT Area:     3.46 cm  LV Volumes (MOD) LV vol d, MOD A2C:  114.0 ml LV vol d, MOD A4C: 117.0 ml LV vol s, MOD A2C: 61.9 ml LV vol s, MOD A4C: 54.5 ml LV SV MOD A2C:     52.1 ml LV SV MOD A4C:     117.0 ml LV SV MOD BP:      54.9 ml RIGHT VENTRICLE RV S prime:     14.80 cm/s LEFT ATRIUM             Index        RIGHT ATRIUM           Index LA diam:        4.10 cm 1.77 cm/m   RA Area:     13.20 cm LA Vol (A2C):   63.1 ml 27.22 ml/m  RA Volume:   31.60 ml  13.63 ml/m LA Vol (A4C):   70.4 ml 30.37 ml/m LA Biplane Vol: 72.2 ml 31.14 ml/m  AORTIC VALVE LVOT Vmax:   80.00 cm/s LVOT Vmean:  53.600 cm/s LVOT VTI:    0.109 m  AORTA Ao Root diam: 3.50 cm Ao Asc diam:  3.00 cm MITRAL VALVE  TRICUSPID VALVE MV Area (PHT): 4.19 cm     TR Peak grad:   28.3 mmHg MV Decel Time: 181 msec     TR Vmax:        266.00 cm/s MV E velocity: 103.75 cm/s                             SHUNTS                             Systemic VTI:  0.11 m                             Systemic Diam: 2.10 cm Kardie Tobb DO Electronically signed by Berniece Salines DO Signature Date/Time: 03/12/2022/2:19:52 PM    Final    DG CHEST PORT 1 VIEW  Result Date: 03/12/2022 CLINICAL DATA:  Shortness of breath. EXAM: PORTABLE CHEST 1 VIEW COMPARISON:  03/11/2022. FINDINGS: Similar mild interstitial and patchy airspace opacities in the left lung. Similar streaky opacities at the right lung base. No visible pleural effusions or pneumothorax. No acute osseous abnormality. Polyarticular degenerative change. IMPRESSION: 1. Similar mild interstitial and patchy airspace opacities in the left lung which could represent asymmetric edema or infection. 2. Similar mild streaky right basilar opacities, which could represent atelectasis and/or pneumonia. Electronically Signed   By: Margaretha Sheffield M.D.   On: 03/12/2022 08:08   VAS [place] LOWER EXTREMITY VENOUS (DVT)  Result Date: 03/12/2022  Lower Venous DVT Study Patient Name:  Darren Hinton.  Date of Exam:   03/11/2022 Medical Rec #: [id number]            Accession #:     [id number] Date of Birth: 01-11-1946           Patient Gender: M Patient Age:   51 years Exam Location:  [facility] Procedure:      VAS [place] LOWER EXTREMITY VENOUS (DVT) Referring Phys: Raiford Noble --------------------------------------------------------------------------------  Indications: Swelling.  Comparison Study: No prior studies. Performing Technologist: Darlin Coco RDMS, RVT  Examination Guidelines: A complete evaluation includes B-mode imaging, spectral Doppler, color Doppler, and power Doppler as needed of all accessible portions of each vessel. Bilateral testing is considered an integral part of a complete examination. Limited examinations for reoccurring indications may be performed as noted. The reflux portion of the exam is performed with the patient in reverse Trendelenburg.  +---------+---------------+---------+-----------+----------+--------------+ RIGHT    CompressibilityPhasicitySpontaneityPropertiesThrombus Aging +---------+---------------+---------+-----------+----------+--------------+ CFV      Full           Yes      Yes                                 +---------+---------------+---------+-----------+----------+--------------+ SFJ      Full                                                        +---------+---------------+---------+-----------+----------+--------------+ FV Prox  Full                                                        +---------+---------------+---------+-----------+----------+--------------+  FV Mid   Full                                                        +---------+---------------+---------+-----------+----------+--------------+ FV DistalFull                                                        +---------+---------------+---------+-----------+----------+--------------+ PFV      Full                                                         +---------+---------------+---------+-----------+----------+--------------+ POP      Full           Yes      Yes                                 +---------+---------------+---------+-----------+----------+--------------+ PTV      Full                                                        +---------+---------------+---------+-----------+----------+--------------+ PERO     Full                                                        +---------+---------------+---------+-----------+----------+--------------+   +---------+---------------+---------+-----------+----------+--------------+ LEFT     CompressibilityPhasicitySpontaneityPropertiesThrombus Aging +---------+---------------+---------+-----------+----------+--------------+ CFV      Full           Yes      Yes                                 +---------+---------------+---------+-----------+----------+--------------+ SFJ      Full                                                        +---------+---------------+---------+-----------+----------+--------------+ FV Prox  Full                                                        +---------+---------------+---------+-----------+----------+--------------+ FV Mid   Full                                                        +---------+---------------+---------+-----------+----------+--------------+  FV DistalFull                                                        +---------+---------------+---------+-----------+----------+--------------+ PFV      Full                                                        +---------+---------------+---------+-----------+----------+--------------+ POP      Full           Yes      Yes                                 +---------+---------------+---------+-----------+----------+--------------+ PTV      Full                                                         +---------+---------------+---------+-----------+----------+--------------+ PERO     Full                                                        +---------+---------------+---------+-----------+----------+--------------+     Summary: RIGHT: - There is no evidence of deep vein thrombosis in the lower extremity.  - No cystic structure found in the popliteal fossa.  LEFT: - There is no evidence of deep vein thrombosis in the lower extremity.  - No cystic structure found in the popliteal fossa.  *See table(s) above for measurements and observations. Electronically signed by Jamelle Haring on 03/12/2022 at 2:07:01 AM.    Final    DG CHEST PORT 1 VIEW  Result Date: 03/11/2022 CLINICAL DATA:  Shortness of breath EXAM: PORTABLE CHEST 1 VIEW COMPARISON:  03/08/2022 FINDINGS: Heart size within normal limits. Interval development of interstitial opacities within the left mid to lower lung. Elevation of the right hemidiaphragm with streaky right basilar opacity. No pleural effusion or pneumothorax. IMPRESSION: 1. Interval development of interstitial opacities within the left mid to lower lung concerning for atypical/viral infection. 2. Streaky right basilar opacity may represent atelectasis versus developing infection. Electronically Signed   By: Davina Poke D.O.   On: 03/11/2022 10:37   DG Chest Port 1 View  Result Date: 03/08/2022 CLINICAL DATA:  Questionable sepsis - evaluate for abnormality Fever.  Chemotherapy, most recently Monday. EXAM: PORTABLE CHEST 1 VIEW COMPARISON:  Chest radiograph 03/12/2014 FINDINGS: The heart is normal in size. Streaky opacity at the right lung base is unchanged from prior exam and most consistent with scarring. No evidence of acute airspace disease. No pulmonary edema, pleural effusion, or pneumothorax. No acute osseous abnormalities are seen on this portable exam, patient with history of myeloma. IMPRESSION: No acute chest findings. Unchanged scarring at the right lung base.  Electronically Signed   By: Keith Rake M.D.   On: 03/08/2022 23:45  ASSESSMENT & PLAN Darren Hinton. 76 y.o. male with medical history significant for IgA lambda multiple myeloma who presents for a follow up visit.   Previously we discussed the disease process of multiple myeloma.  We discussed the incurable nature of this disease and the nature of the treatment.  We discussed that this is a cancer of the bone marrow with plasma cells producing an abundance of M protein which in this patient's case causes his severe renal dysfunction.  Additionally we noted that myeloma can cause lytic lesions of the bone, anemia, and increased levels of calcium.  We discussed the treatment moving forward including doublet and triplet therapy.  Given the patient's good health overall, but poor kidney function I would recommend proceeding with CyBorD chemotherapy.  Mr. Selvidge voices understanding of the treatment regimen and the plan moving forward.  At this time we will plan to proceed with Velcade 1.5 mg per metered squared subq weekly, cyclophosphamide 300 mg per metered squared IV weekly, and dexamethasone 20 mg p.o. weekly.  Cycles will consist of 4 weekly treatments and will continue for a minimum of 6 cycles before consideration of maintenance therapy.  Additionally if kidney function improves can consider transitioning regimen to VRD.    # IgA lambda Multiple Myeloma -- Bone marrow biopsy performed on 01/16/2022 showed increased plasma cells consistent with a plasma cell neoplasm.  Normal FISH panel. --Patient meets diagnostic criteria based on kidney dysfunction and anemia. --Cycle 1 Day 1 of CyBorD chemotherapy started on 02/07/2022 Plan: -- Labs today show white blood cell 3.2, hemoglobin 8.5, MCV 102.7, and platelets of 132. OK to proceed with treatment.  --today is Cycle 2 Day 15 of CyBorD --RTC in 2 weeks with interval weekly treatments   #Supportive Care -- chemotherapy education  complete -- port placement not required.  -- zofran 8mg  q8H PRN and compazine 10mg  PO q6H for nausea -- acyclovir 400mg  PO BID for VCZ prophylaxis -- allopurinol 300mg  PO daily for TLS prophylaxis --zometa to start after dental clearance. Will discuss once treatment is underway. -- no pain medication required at this time.   No orders of the defined types were placed in this encounter.  All questions were answered. The patient knows to call the clinic with any problems, questions or concerns.  A total of more than 30 minutes were spent on this encounter with face-to-face time and non-face-to-face time, including preparing to see the patient, ordering tests and/or medications, counseling the patient and coordination of care as outlined above.   Ledell Peoples, MD Department of Hematology/Oncology [place] at [facility] Phone: [phone] Pager: [phone] Email: [email or web address]  04/07/2022 9:19 AM

## 2022-04-08 ENCOUNTER — Telehealth: Payer: Self-pay | Admitting: Hematology and Oncology

## 2022-04-08 ENCOUNTER — Other Ambulatory Visit: Payer: Self-pay

## 2022-04-08 LAB — KAPPA/LAMBDA LIGHT CHAINS
Kappa free light chain: 14.5 mg/L (ref 3.3–19.4)
Kappa, lambda light chain ratio: 2.2 — ABNORMAL HIGH (ref 0.26–1.65)
Lambda free light chains: 6.6 mg/L (ref 5.7–26.3)

## 2022-04-08 NOTE — Telephone Encounter (Signed)
Per 7/31 los called and spoke to pt's wife about upcoming appointments.  Pt confirmed appointments

## 2022-04-09 LAB — MULTIPLE MYELOMA PANEL, SERUM
Albumin SerPl Elph-Mcnc: 3.2 g/dL (ref 2.9–4.4)
Albumin/Glob SerPl: 1.5 (ref 0.7–1.7)
Alpha 1: 0.3 g/dL (ref 0.0–0.4)
Alpha2 Glob SerPl Elph-Mcnc: 0.6 g/dL (ref 0.4–1.0)
B-Globulin SerPl Elph-Mcnc: 0.9 g/dL (ref 0.7–1.3)
Gamma Glob SerPl Elph-Mcnc: 0.4 g/dL (ref 0.4–1.8)
Globulin, Total: 2.2 g/dL (ref 2.2–3.9)
IgA: 159 mg/dL (ref 61–437)
IgG (Immunoglobin G), Serum: 523 mg/dL — ABNORMAL LOW (ref 603–1613)
IgM (Immunoglobulin M), Srm: 39 mg/dL (ref 15–143)
M Protein SerPl Elph-Mcnc: 0.1 g/dL — ABNORMAL HIGH
Total Protein ELP: 5.4 g/dL — ABNORMAL LOW (ref 6.0–8.5)

## 2022-04-14 ENCOUNTER — Other Ambulatory Visit: Payer: Self-pay

## 2022-04-14 ENCOUNTER — Ambulatory Visit: Payer: No Typology Code available for payment source | Admitting: General Practice

## 2022-04-14 ENCOUNTER — Inpatient Hospital Stay: Payer: No Typology Code available for payment source

## 2022-04-14 ENCOUNTER — Inpatient Hospital Stay: Payer: No Typology Code available for payment source | Attending: Hematology and Oncology

## 2022-04-14 VITALS — BP 167/82 | HR 65 | Temp 98.0°F | Resp 17 | Wt 220.5 lb

## 2022-04-14 DIAGNOSIS — K5900 Constipation, unspecified: Secondary | ICD-10-CM | POA: Insufficient documentation

## 2022-04-14 DIAGNOSIS — Z79890 Hormone replacement therapy (postmenopausal): Secondary | ICD-10-CM | POA: Insufficient documentation

## 2022-04-14 DIAGNOSIS — Z79624 Long term (current) use of inhibitors of nucleotide synthesis: Secondary | ICD-10-CM | POA: Diagnosis not present

## 2022-04-14 DIAGNOSIS — C9000 Multiple myeloma not having achieved remission: Secondary | ICD-10-CM | POA: Diagnosis present

## 2022-04-14 DIAGNOSIS — I480 Paroxysmal atrial fibrillation: Secondary | ICD-10-CM | POA: Diagnosis not present

## 2022-04-14 DIAGNOSIS — Z881 Allergy status to other antibiotic agents status: Secondary | ICD-10-CM | POA: Diagnosis not present

## 2022-04-14 DIAGNOSIS — Z809 Family history of malignant neoplasm, unspecified: Secondary | ICD-10-CM | POA: Insufficient documentation

## 2022-04-14 DIAGNOSIS — Z8719 Personal history of other diseases of the digestive system: Secondary | ICD-10-CM | POA: Insufficient documentation

## 2022-04-14 DIAGNOSIS — Z5112 Encounter for antineoplastic immunotherapy: Secondary | ICD-10-CM | POA: Insufficient documentation

## 2022-04-14 DIAGNOSIS — Z87891 Personal history of nicotine dependence: Secondary | ICD-10-CM | POA: Insufficient documentation

## 2022-04-14 DIAGNOSIS — Z79899 Other long term (current) drug therapy: Secondary | ICD-10-CM | POA: Insufficient documentation

## 2022-04-14 DIAGNOSIS — Z7901 Long term (current) use of anticoagulants: Secondary | ICD-10-CM | POA: Insufficient documentation

## 2022-04-14 DIAGNOSIS — Z5111 Encounter for antineoplastic chemotherapy: Secondary | ICD-10-CM | POA: Diagnosis present

## 2022-04-14 DIAGNOSIS — R5383 Other fatigue: Secondary | ICD-10-CM | POA: Diagnosis not present

## 2022-04-14 LAB — CMP (CANCER CENTER ONLY)
ALT: 13 U/L (ref 0–44)
AST: 19 U/L (ref 15–41)
Albumin: 3.7 g/dL (ref 3.5–5.0)
Alkaline Phosphatase: 74 U/L (ref 38–126)
Anion gap: 3 — ABNORMAL LOW (ref 5–15)
BUN: 10 mg/dL (ref 8–23)
CO2: 27 mmol/L (ref 22–32)
Calcium: 8.1 mg/dL — ABNORMAL LOW (ref 8.9–10.3)
Chloride: 108 mmol/L (ref 98–111)
Creatinine: 1.8 mg/dL — ABNORMAL HIGH (ref 0.61–1.24)
GFR, Estimated: 39 mL/min — ABNORMAL LOW (ref >60)
Glucose, Bld: 98 mg/dL (ref 70–99)
Potassium: 4.4 mmol/L (ref 3.5–5.1)
Sodium: 138 mmol/L (ref 135–145)
Total Bilirubin: 0.6 mg/dL (ref 0.3–1.2)
Total Protein: 6 g/dL — ABNORMAL LOW (ref 6.5–8.1)

## 2022-04-14 LAB — CBC WITH DIFFERENTIAL (CANCER CENTER ONLY)
Abs Immature Granulocytes: 0.01 10*3/uL (ref 0.00–0.07)
Basophils Absolute: 0 10*3/uL (ref 0.0–0.1)
Basophils Relative: 1 %
Eosinophils Absolute: 0.1 10*3/uL (ref 0.0–0.5)
Eosinophils Relative: 3 %
HCT: 25.6 % — ABNORMAL LOW (ref 39.0–52.0)
Hemoglobin: 8.2 g/dL — ABNORMAL LOW (ref 13.0–17.0)
Immature Granulocytes: 0 %
Lymphocytes Relative: 33 %
Lymphs Abs: 1 10*3/uL (ref 0.7–4.0)
MCH: 32.7 pg (ref 26.0–34.0)
MCHC: 32 g/dL (ref 30.0–36.0)
MCV: 102 fL — ABNORMAL HIGH (ref 80.0–100.0)
Monocytes Absolute: 0.6 10*3/uL (ref 0.1–1.0)
Monocytes Relative: 20 %
Neutro Abs: 1.3 10*3/uL — ABNORMAL LOW (ref 1.7–7.7)
Neutrophils Relative %: 43 %
Platelet Count: 152 10*3/uL (ref 150–400)
RBC: 2.51 MIL/uL — ABNORMAL LOW (ref 4.22–5.81)
RDW: 18.6 % — ABNORMAL HIGH (ref 11.5–15.5)
WBC Count: 3 10*3/uL — ABNORMAL LOW (ref 4.0–10.5)
nRBC: 0.7 % — ABNORMAL HIGH (ref 0.0–0.2)

## 2022-04-14 LAB — LACTATE DEHYDROGENASE: LDH: 216 U/L — ABNORMAL HIGH (ref 98–192)

## 2022-04-14 MED ORDER — PALONOSETRON HCL INJECTION 0.25 MG/5ML
0.2500 mg | Freq: Once | INTRAVENOUS | Status: AC
  Administered 2022-04-14: 0.25 mg via INTRAVENOUS
  Filled 2022-04-14: qty 5

## 2022-04-14 MED ORDER — BORTEZOMIB CHEMO SQ INJECTION 3.5 MG (2.5MG/ML)
1.5000 mg/m2 | Freq: Once | INTRAMUSCULAR | Status: AC
  Administered 2022-04-14: 3.5 mg via SUBCUTANEOUS
  Filled 2022-04-14: qty 1.4

## 2022-04-14 MED ORDER — SODIUM CHLORIDE 0.9 % IV SOLN: Freq: Once | INTRAVENOUS | Status: AC

## 2022-04-14 MED ORDER — SODIUM CHLORIDE 0.9 % IV SOLN
300.0000 mg/m2 | Freq: Once | INTRAVENOUS | Status: AC
  Administered 2022-04-14: 700 mg via INTRAVENOUS
  Filled 2022-04-14: qty 35

## 2022-04-14 NOTE — Patient Instructions (Signed)
[facility]  Discharge Instructions: Thank you for choosing [facility] to provide your oncology and hematology care.   If you have a lab appointment with the Cancer Center, please go directly to the Cancer Center and check in at the registration area.   Wear comfortable clothing and clothing appropriate for easy access to any Portacath or PICC line.   We strive to give you quality time with your provider. You may need to reschedule your appointment if you arrive late (15 or more minutes).  Arriving late affects you and other patients whose appointments are after yours.  Also, if you miss three or more appointments without notifying the office, you may be dismissed from the clinic at the provider's discretion.      For prescription refill requests, have your pharmacy contact our office and allow 72 hours for refills to be completed.    Today you received the following chemotherapy and/or immunotherapy agents: Velcade and Cytoxan      To help prevent nausea and vomiting after your treatment, we encourage you to take your nausea medication as directed.  BELOW ARE SYMPTOMS THAT SHOULD BE REPORTED IMMEDIATELY: *FEVER GREATER THAN 100.4 F (38 C) OR HIGHER *CHILLS OR SWEATING *NAUSEA AND VOMITING THAT IS NOT CONTROLLED WITH YOUR NAUSEA MEDICATION *UNUSUAL SHORTNESS OF BREATH *UNUSUAL BRUISING OR BLEEDING *URINARY PROBLEMS (pain or burning when urinating, or frequent urination) *BOWEL PROBLEMS (unusual diarrhea, constipation, pain near the anus) TENDERNESS IN MOUTH AND THROAT WITH OR WITHOUT PRESENCE OF ULCERS (sore throat, sores in mouth, or a toothache) UNUSUAL RASH, SWELLING OR PAIN  UNUSUAL VAGINAL DISCHARGE OR ITCHING   Items with * indicate a potential emergency and should be followed up as soon as possible or go to the Emergency Department if any problems should occur.  Please show the CHEMOTHERAPY ALERT CARD or IMMUNOTHERAPY ALERT CARD at  check-in to the Emergency Department and triage nurse.  Should you have questions after your visit or need to cancel or reschedule your appointment, please contact [facility]  Dept: [phone]  and follow the prompts.  Office hours are 8:00 a.m. to 4:30 p.m. Monday - Friday. Please note that voicemails left after 4:00 p.m. may not be returned until the following business day.  We are closed weekends and major holidays. You have access to a nurse at all times for urgent questions. Please call the main number to the clinic Dept: [phone] and follow the prompts.   For any non-urgent questions, you may also contact your provider using MyChart. We now offer e-Visits for anyone 18 and older to request care online for non-urgent symptoms. For details visit [email or web address].   Also download the MyChart app! Go to the app store, search "MyChart", open the app, select [facility], and log in with your MyChart username and password.  Masks are optional in the cancer centers. If you would like for your care team to wear a mask while they are taking care of you, please let them know. You may have one support person who is at least 77 years old accompany you for your appointments. 

## 2022-04-14 NOTE — Progress Notes (Signed)
Ok to treat with ANC 1.3 K/uL and Creat 1.8 mg/dL per Dr [name]

## 2022-04-15 ENCOUNTER — Telehealth: Payer: Self-pay | Admitting: Cardiovascular Disease

## 2022-04-15 DIAGNOSIS — I4891 Unspecified atrial fibrillation: Secondary | ICD-10-CM

## 2022-04-15 NOTE — Telephone Encounter (Signed)
*  STAT* If patient is at the pharmacy, call can be transferred to refill team.   1. Which medications need to be refilled? (please list name of each medication and dose if known) apixaban (ELIQUIS) 5 MG TABS tablet  2. Which pharmacy/location (including street and city if local pharmacy) is medication to be sent to? [id number] [place], [place] - [address] [place] [address]. Do they need a 30 day or 90 day supply? [address]

## 2022-04-16 MED ORDER — APIXABAN 5 MG PO TABS: 5.0000 mg | ORAL_TABLET | Freq: Two times a day (BID) | ORAL | 1 refills | Status: DC

## 2022-04-16 NOTE — Telephone Encounter (Signed)
Prescription refill request for Eliquis received. Indication: Afib  Last office visit: 04/03/22 Marlene Lard)  Scr: 1.80 (04/14/22)  Age: 76 Weight: 100kg  Appropriate dose and refill sent to requested pharmacy.

## 2022-04-17 ENCOUNTER — Encounter (HOSPITAL_COMMUNITY): Payer: Self-pay

## 2022-04-17 ENCOUNTER — Ambulatory Visit (HOSPITAL_COMMUNITY): Payer: No Typology Code available for payment source | Admitting: Physician Assistant

## 2022-04-22 ENCOUNTER — Inpatient Hospital Stay: Payer: No Typology Code available for payment source

## 2022-04-22 ENCOUNTER — Telehealth: Payer: Self-pay

## 2022-04-22 ENCOUNTER — Other Ambulatory Visit: Payer: Self-pay

## 2022-04-22 ENCOUNTER — Inpatient Hospital Stay (HOSPITAL_BASED_OUTPATIENT_CLINIC_OR_DEPARTMENT_OTHER): Payer: No Typology Code available for payment source | Admitting: Physician Assistant

## 2022-04-22 VITALS — BP 166/86 | HR 55 | Temp 98.0°F | Resp 20

## 2022-04-22 VITALS — BP 153/73 | HR 58 | Temp 97.7°F | Resp 20 | Wt 219.1 lb

## 2022-04-22 DIAGNOSIS — C9000 Multiple myeloma not having achieved remission: Secondary | ICD-10-CM

## 2022-04-22 DIAGNOSIS — Z5112 Encounter for antineoplastic immunotherapy: Secondary | ICD-10-CM | POA: Diagnosis not present

## 2022-04-22 LAB — CBC WITH DIFFERENTIAL (CANCER CENTER ONLY)
Abs Immature Granulocytes: 0.02 10*3/uL (ref 0.00–0.07)
Basophils Absolute: 0 10*3/uL (ref 0.0–0.1)
Basophils Relative: 0 %
Eosinophils Absolute: 0.1 10*3/uL (ref 0.0–0.5)
Eosinophils Relative: 2 %
HCT: 29.2 % — ABNORMAL LOW (ref 39.0–52.0)
Hemoglobin: 9.4 g/dL — ABNORMAL LOW (ref 13.0–17.0)
Immature Granulocytes: 0 %
Lymphocytes Relative: 49 %
Lymphs Abs: 2.2 10*3/uL (ref 0.7–4.0)
MCH: 33.1 pg (ref 26.0–34.0)
MCHC: 32.2 g/dL (ref 30.0–36.0)
MCV: 102.8 fL — ABNORMAL HIGH (ref 80.0–100.0)
Monocytes Absolute: 0.8 10*3/uL (ref 0.1–1.0)
Monocytes Relative: 17 %
Neutro Abs: 1.4 10*3/uL — ABNORMAL LOW (ref 1.7–7.7)
Neutrophils Relative %: 32 %
Platelet Count: 178 10*3/uL (ref 150–400)
RBC: 2.84 MIL/uL — ABNORMAL LOW (ref 4.22–5.81)
RDW: 18.6 % — ABNORMAL HIGH (ref 11.5–15.5)
WBC Count: 4.5 10*3/uL (ref 4.0–10.5)
nRBC: 0 % (ref 0.0–0.2)

## 2022-04-22 LAB — CMP (CANCER CENTER ONLY)
ALT: 20 U/L (ref 0–44)
AST: 27 U/L (ref 15–41)
Albumin: 3.8 g/dL (ref 3.5–5.0)
Alkaline Phosphatase: 72 U/L (ref 38–126)
Anion gap: 6 (ref 5–15)
BUN: 11 mg/dL (ref 8–23)
CO2: 24 mmol/L (ref 22–32)
Calcium: 9.2 mg/dL (ref 8.9–10.3)
Chloride: 110 mmol/L (ref 98–111)
Creatinine: 2 mg/dL — ABNORMAL HIGH (ref 0.61–1.24)
GFR, Estimated: 34 mL/min — ABNORMAL LOW (ref >60)
Glucose, Bld: 109 mg/dL — ABNORMAL HIGH (ref 70–99)
Potassium: 4.3 mmol/L (ref 3.5–5.1)
Sodium: 140 mmol/L (ref 135–145)
Total Bilirubin: 0.4 mg/dL (ref 0.3–1.2)
Total Protein: 6.2 g/dL — ABNORMAL LOW (ref 6.5–8.1)

## 2022-04-22 LAB — LACTATE DEHYDROGENASE: LDH: 247 U/L — ABNORMAL HIGH (ref 98–192)

## 2022-04-22 MED ORDER — PALONOSETRON HCL INJECTION 0.25 MG/5ML
0.2500 mg | Freq: Once | INTRAVENOUS | Status: AC
  Administered 2022-04-22: 0.25 mg via INTRAVENOUS

## 2022-04-22 MED ORDER — SODIUM CHLORIDE 0.9 % IV SOLN: Freq: Once | INTRAVENOUS | Status: AC

## 2022-04-22 MED ORDER — SODIUM CHLORIDE 0.9 % IV SOLN
300.0000 mg/m2 | Freq: Once | INTRAVENOUS | Status: AC
  Administered 2022-04-22: 700 mg via INTRAVENOUS
  Filled 2022-04-22: qty 35

## 2022-04-22 MED ORDER — BORTEZOMIB CHEMO SQ INJECTION 3.5 MG (2.5MG/ML)
1.5000 mg/m2 | Freq: Once | INTRAMUSCULAR | Status: AC
  Administered 2022-04-22: 3.5 mg via SUBCUTANEOUS
  Filled 2022-04-22: qty 1.4

## 2022-04-22 NOTE — Telephone Encounter (Signed)
Patient's wife called asking if patient should be taking his blood pressure medicine that had been stopped while he was hospitalized. Encouraged pt to reach out to his PCP - Dr. Philip Aspen for instructions.

## 2022-04-22 NOTE — Progress Notes (Signed)
[place] [phone]   [phone]  PROGRESS NOTE  Patient Care Team: [name], MD as PCP - General (Internal Medicine)  Hematological/Oncological History # IgA Lambda Multiple Myeloma 05/03/2015: last visit with Dr. Julien Nordmann at the [facility]. Was followed for IgA lambda MGUS. 12/11/2021: labs show M protein 3.4, Kappa 19.2, Lambda 1576.4, ratio 0.01. Cr 3.47, Hgb 8.6, WBC 5.7, MCV 99, Plt 221 12/23/2021: establish care with Dr. [name]  01/16/2022: Bone marrow biopsy performed, showed a 60% cellular bone marrow predominantly comprised of plasma cells making up 70 to 80%, lambda restricted.  Myeloma FISH panel showed no evidence of abnormalities. 02/07/2022: Cycle 1 Day 1 of CyBorD chemotherapy.  03/09/2022-03/17/2022: hospitalized for fever/pneumonia.  03/24/2022: Cycle 2 Day 1 of CyBorD chemotherapy.  04/22/2022: Cycle 3 Day 1 of CyBorD chemotherapy  Interval History:  Darren Hinton. 76 y.o. male with medical history significant for IgA lambda multiple myeloma who presents for a follow up visit. The patient's last visit was on 04/07/2022. In the interim since the last visit he has had no major changes in his health.   On exam today Darren Hinton reports that he is tolerating the treatment well without any prohibitive toxicities.  He does have some mild fatigue but continues to complete his daily activities on his own.  He uses a walker to assist with ambulation.  Patient has a good appetite and denies any weight loss.  He denies any nausea, vomiting or abdominal pain.  He reports his bowel habits are back to baseline after taking senna for a few days following treatment.  He denies easy bruising or signs of active bleeding.  Patient denies any fevers, chills, night sweats, shortness of breath, chest pain, cough, neuropathy, peripheral edema, headaches or dizziness.  He has no other complaints.  A full 10 point ROS is otherwise negative.  MEDICAL  HISTORY:  Past Medical History:  Diagnosis Date   Allergy    Anxiety    Arthritis    Depression    Heart murmur    Hyperlipidemia    Hyperplastic colon polyp    Hyperproteinemia    Hypertension    Hypothyroidism    Mitral valve prolapse    Paroxysmal atrial fibrillation (HCC)    Thyroid disease     SURGICAL HISTORY: Past Surgical History:  Procedure Laterality Date   CARDIOVERSION N/A 03/14/2022   Procedure: CARDIOVERSION;  Surgeon: Pixie Casino, MD;  Location: [place];  Service: Cardiovascular;  Laterality: N/A;   TEE WITHOUT CARDIOVERSION N/A 03/14/2022   Procedure: TRANSESOPHAGEAL ECHOCARDIOGRAM (TEE);  Surgeon: Pixie Casino, MD;  Location: [facility] ENDOSCOPY;  Service: Cardiovascular;  Laterality: N/A;    SOCIAL HISTORY: Social History   Socioeconomic History   Marital status: Married    Spouse name: Not on file   Number of children: Not on file   Years of education: Not on file   Highest education level: Not on file  Occupational History   Not on file  Tobacco Use   Smoking status: Former    Years: 20.00    Types: Cigarettes    Quit date: 10/01/1978    Years since quitting: 43.5   Smokeless tobacco: Never   Tobacco comments:    Former smoker 04/03/22  Substance and Sexual Activity   Alcohol use: Yes    Alcohol/week: 2.0 standard drinks of alcohol    Types: 2 Glasses of wine per week    Comment: occasionally   Drug use:  No   Sexual activity: Not on file  Other Topics Concern   Not on file  Social History Narrative   Not on file   Social Determinants of Health   Financial Resource Strain: Not on file  Food Insecurity: Not on file  Transportation Needs: Not on file  Physical Activity: Not on file  Stress: Not on file  Social Connections: Not on file  Intimate Partner Violence: Not on file    FAMILY HISTORY: Family History  Problem Relation Age of Onset   Cancer Father    Colon cancer Neg Hx    Esophageal cancer Neg Hx    Liver cancer Neg  Hx    Pancreatic cancer Neg Hx    Rectal cancer Neg Hx    Stomach cancer Neg Hx     ALLERGIES:  is allergic to zithromax [azithromycin].  MEDICATIONS:  Current Outpatient Medications  Medication Sig Dispense Refill   acyclovir (ZOVIRAX) 400 MG tablet Take 1 tablet (400 mg total) by mouth 2 (two) times daily. 180 tablet 1   allopurinol (ZYLOPRIM) 300 MG tablet Take 1 tablet (300 mg total) by mouth daily. 90 tablet 1   ALPRAZolam (XANAX) 0.25 MG tablet Take 0.25 mg by mouth daily as needed. Anxiety      amiodarone (PACERONE) 200 MG tablet Take 1 tablet (200 mg total) by mouth daily. 30 tablet 3   amLODipine (NORVASC) 5 MG tablet Take 5 mg by mouth 2 (two) times daily.     apixaban (ELIQUIS) 5 MG TABS tablet Take 1 tablet (5 mg total) by mouth 2 (two) times daily. 180 tablet 1   atorvastatin (LIPITOR) 20 MG tablet Take 20 mg by mouth daily.     dexamethasone (DECADRON) 4 MG tablet Take 10 tablets (40 mg total) by mouth once a week. Take steroids pills the in the morning on chemotherapy days. 120 tablet 1   ergocalciferol (VITAMIN D2) 1.25 MG (50000 UT) capsule Take 50,000 Units by mouth every Wednesday.     ferrous gluconate (FERGON) 324 MG tablet Take 1 tablet (324 mg total) by mouth 3 (three) times daily with meals. 90 tablet 0   fluticasone (FLONASE) 50 MCG/ACT nasal spray Place 2 sprays into the nose daily as needed. Allergies      gabapentin (NEURONTIN) 300 MG capsule Take 300 mg by mouth daily.     levothyroxine (SYNTHROID) 100 MCG tablet Take 100 mcg by mouth daily before breakfast.     loratadine (CLARITIN) 10 MG tablet Take 10 mg by mouth daily.     meclizine (ANTIVERT) 25 MG tablet Take 1 tablet (25 mg total) by mouth 3 (three) times daily as needed for dizziness. 30 tablet 0   metoprolol succinate (TOPROL-XL) 25 MG 24 hr tablet Take 1 tablet (25 mg total) by mouth daily. Take with or immediately following a meal. 30 tablet 3   ondansetron (ZOFRAN) 8 MG tablet Take 1 tablet (8 mg  total) by mouth every 8 (eight) hours as needed. 30 tablet 0   prochlorperazine (COMPAZINE) 10 MG tablet Take 1 tablet (10 mg total) by mouth every 6 (six) hours as needed for nausea or vomiting. 30 tablet 0   senna-docusate (SENOKOT-S) 8.6-50 MG tablet Take 1 tablet by mouth at bedtime as needed for mild constipation.     vitamin B-12 (CYANOCOBALAMIN) 500 MCG tablet Take 1 tablet by mouth daily.     Current Facility-Administered Medications  Medication Dose Route Frequency Provider Last Rate Last Admin   0.9 %  sodium chloride infusion  500 mL Intravenous Once Armbruster, Carlota Raspberry, MD        REVIEW OF SYSTEMS:   Constitutional: ( - ) fevers, ( - )  chills , ( - ) night sweats Eyes: ( - ) blurriness of vision, ( - ) double vision, ( - ) watery eyes Ears, nose, mouth, throat, and face: ( - ) mucositis, ( - ) sore throat Respiratory: ( - ) cough, ( - ) dyspnea, ( - ) wheezes Cardiovascular: ( - ) palpitation, ( - ) chest discomfort, ( - ) lower extremity swelling Gastrointestinal:  ( - ) nausea, ( - ) heartburn, ( - ) change in bowel habits Skin: ( - ) abnormal skin rashes Lymphatics: ( - ) new lymphadenopathy, ( - ) easy bruising Neurological: ( - ) numbness, ( - ) tingling, ( - ) new weaknesses Behavioral/Psych: ( - ) mood change, ( - ) new changes  All other systems were reviewed with the patient and are negative.  PHYSICAL EXAMINATION: ECOG PERFORMANCE STATUS: 1 - Symptomatic but completely ambulatory  Vitals:   04/22/22 0952  BP: (!) 153/73  Pulse: (!) 58  Resp: 20  Temp: 97.7 F (36.5 C)  SpO2: 100%     Filed Weights   04/22/22 0952  Weight: 219 lb 1.6 oz (99.4 kg)      GENERAL: Chronically ill-appearing elderly African-American male, alert, no distress and comfortable SKIN: skin color, texture, turgor are normal, no rashes or significant lesions EYES: conjunctiva are pink and non-injected, sclera clear LUNGS: clear to auscultation and percussion with normal  breathing effort HEART: regular rate & rhythm and no murmurs and no lower extremity edema Musculoskeletal: no cyanosis of digits and no clubbing  PSYCH: alert & oriented x 3, fluent speech NEURO: no focal motor/sensory deficits  LABORATORY DATA:  I have reviewed the data as listed    Latest Ref Rng & Units 04/22/2022    9:37 AM 04/14/2022    9:20 AM 04/07/2022    8:12 AM  CBC  WBC 4.0 - 10.5 K/uL 4.5  3.0  3.2   Hemoglobin 13.0 - 17.0 g/dL 9.4  8.2  8.5   Hematocrit 39.0 - 52.0 % 29.2  25.6  26.3   Platelets 150 - 400 K/uL 178  152  132        Latest Ref Rng & Units 04/14/2022    9:20 AM 04/07/2022    8:12 AM 03/31/2022    9:33 AM  CMP  Glucose 70 - 99 mg/dL 98  112  97   BUN 8 - 23 mg/dL '10  12  12   ' Creatinine 0.61 - 1.24 mg/dL 1.80  1.85  1.81   Sodium 135 - 145 mmol/L 138  139  140   Potassium 3.5 - 5.1 mmol/L 4.4  3.8  4.2   Chloride 98 - 111 mmol/L 108  108  111   CO2 22 - 32 mmol/L '27  24  23   ' Calcium 8.9 - 10.3 mg/dL 8.1  8.3  8.9   Total Protein 6.5 - 8.1 g/dL 6.0  6.1  6.4   Total Bilirubin 0.3 - 1.2 mg/dL 0.6  0.6  0.5   Alkaline Phos 38 - 126 U/L 74  69  70   AST 15 - 41 U/L '19  15  16   ' ALT 0 - 44 U/L '13  10  14     ' Lab Results  Component Value Date   MPROTEIN  0.1 (H) 04/07/2022   MPROTEIN 0.1 (H) 03/31/2022   MPROTEIN 0.7 (H) 03/03/2022   Lab Results  Component Value Date   KPAFRELGTCHN 14.5 04/07/2022   KPAFRELGTCHN 16.2 03/31/2022   KPAFRELGTCHN 14.1 03/03/2022   LAMBDASER 6.6 04/07/2022   LAMBDASER 8.4 03/31/2022   LAMBDASER 15.8 03/03/2022   KAPLAMBRATIO 2.20 (H) 04/07/2022   KAPLAMBRATIO 1.93 (H) 03/31/2022   KAPLAMBRATIO 0.89 03/03/2022    RADIOGRAPHIC STUDIES: No results found.  ASSESSMENT & PLAN Darren Hinton. 76 y.o. male with medical history significant for IgA lambda multiple myeloma who presents for a follow up visit.   Previously we discussed the disease process of multiple myeloma.  We discussed the incurable nature of this  disease and the nature of the treatment.  We discussed that this is a cancer of the bone marrow with plasma cells producing an abundance of M protein which in this patient's case causes his severe renal dysfunction.  Additionally we noted that myeloma can cause lytic lesions of the bone, anemia, and increased levels of calcium.  We discussed the treatment moving forward including doublet and triplet therapy.  Given the patient's good health overall, but poor kidney function I would recommend proceeding with CyBorD chemotherapy.  Mr. Baney voices understanding of the treatment regimen and the plan moving forward.  At this time we will plan to proceed with Velcade 1.5 mg per metered squared subq weekly, cyclophosphamide 300 mg per metered squared IV weekly, and dexamethasone 20 mg p.o. weekly.  Cycles will consist of 4 weekly treatments and will continue for a minimum of 6 cycles before consideration of maintenance therapy.  Additionally if kidney function improves can consider transitioning regimen to VRD.    # IgA lambda Multiple Myeloma -- Bone marrow biopsy performed on 01/16/2022 showed increased plasma cells consistent with a plasma cell neoplasm.  Normal FISH panel. --Patient meets diagnostic criteria based on kidney dysfunction and anemia. --Cycle 1 Day 1 of CyBorD chemotherapy started on 02/07/2022 Plan: --Most recent SPEP from 04/07/2022 showed M protein 0.1 g/dL, kappa 14.5, lambda 6.6, ratio 2.20.  --today is Cycle 3 Day 1 of CyBorD --Labs today show white blood cell 4.5, hemoglobin 9.4, MCV 102.8, and platelets of 178. OK to proceed with treatment.  --RTC in 2 weeks with interval weekly treatments   #Supportive Care -- chemotherapy education complete -- port placement not required.  -- zofran 79m q8H PRN and compazine 124mPO q6H for nausea -- acyclovir 40042mO BID for VCZ prophylaxis -- allopurinol 300m68m daily for TLS prophylaxis --zometa to start after dental clearance. Will  discuss once treatment is underway. -- no pain medication required at this time.   No orders of the defined types were placed in this encounter.  All questions were answered. The patient knows to call the clinic with any problems, questions or concerns.  I have spent a total of 30 minutes minutes of face-to-face and non-face-to-face time, preparing to see the patient, performing a medically appropriate examination, counseling and educating the patient, documenting clinical information in the electronic health record,  and care coordination.   IrenDede QueryC Dept of Hematology and OncoLindstromWeslDoctors Memorial Hospitalne: [phone]/15/2023 10:25 AM

## 2022-04-22 NOTE — Progress Notes (Signed)
Pt. states he took Decadron 40 mg po this am. Per Dede Query PA, ok for treatment today with ANC 1.4 K/uL and serum creatine 2.0 mg/dL

## 2022-04-22 NOTE — Patient Instructions (Signed)
[facility]  Discharge Instructions: Thank you for choosing [facility] to provide your [facility] and hematology care.   If you have a lab appointment with the [facility], please go directly to the [facility] and check in at the registration area.   Wear comfortable clothing and clothing appropriate for easy access to any Portacath or PICC line.   We strive to give you quality time with your provider. You may need to reschedule your appointment if you arrive late (15 or more minutes).  Arriving late affects you and other patients whose appointments are after yours.  Also, if you miss three or more appointments without notifying the office, you may be dismissed from the clinic at the provider's discretion.      For prescription refill requests, have your pharmacy contact our office and allow 72 hours for refills to be completed.    Today you received the following chemotherapy and/or immunotherapy agents: Velcade and Cytoxan.   To help prevent nausea and vomiting after your treatment, we encourage you to take your nausea medication as directed.  BELOW ARE SYMPTOMS THAT SHOULD BE REPORTED IMMEDIATELY: *FEVER GREATER THAN 100.4 F (38 C) OR HIGHER *CHILLS OR SWEATING *NAUSEA AND VOMITING THAT IS NOT CONTROLLED WITH YOUR NAUSEA MEDICATION *UNUSUAL SHORTNESS OF BREATH *UNUSUAL BRUISING OR BLEEDING *URINARY PROBLEMS (pain or burning when urinating, or frequent urination) *BOWEL PROBLEMS (unusual diarrhea, constipation, pain near the anus) TENDERNESS IN MOUTH AND THROAT WITH OR WITHOUT PRESENCE OF ULCERS (sore throat, sores in mouth, or a toothache) UNUSUAL RASH, SWELLING OR PAIN  UNUSUAL VAGINAL DISCHARGE OR ITCHING   Items with * indicate a potential emergency and should be followed up as soon as possible or go to the Emergency Department if any problems should occur.  Please show the CHEMOTHERAPY ALERT CARD or IMMUNOTHERAPY ALERT CARD at  check-in to the Emergency Department and triage nurse.  Should you have questions after your visit or need to cancel or reschedule your appointment, please contact [facility]  Dept: [phone]  and follow the prompts.  Office hours are 8:00 a.m. to 4:30 p.m. Monday - Friday. Please note that voicemails left after 4:00 p.m. may not be returned until the following business day.  We are closed weekends and major holidays. You have access to a nurse at all times for urgent questions. Please call the main number to the clinic Dept: [phone] and follow the prompts.   For any non-urgent questions, you may also contact your provider using MyChart. We now offer e-Visits for anyone 11 and older to request care online for non-urgent symptoms. For details visit [email or web address].   Also download the MyChart app! Go to the app store, search "MyChart", open the app, select [facility], and log in with your MyChart username and password.  Masks are optional in the cancer centers. If you would like for your care team to wear a mask while they are taking care of you, please let them know. You may have one support person who is at least 77 years old accompany you for your appointments. Bortezomib Injection What is this medication? BORTEZOMIB (bor TEZ oh mib) treats lymphoma. It may also be used to treat multiple myeloma, a type of bone marrow cancer. It works by blocking a protein that causes cancer cells to grow and multiply. This helps to slow or stop the spread of cancer cells. This medicine may be used for other purposes; ask your  care provider or pharmacist if you have questions. COMMON BRAND NAME(S): Velcade What should I tell my care team before I take this medication? They need to know if you have any of these conditions: Dehydration Diabetes Heart disease Liver disease Tingling of the fingers or toes or other nerve disorder An unusual or allergic  reaction to bortezomib, other medications, foods, dyes, or preservatives If you or your partner are pregnant or trying to get pregnant Breastfeeding How should I use this medication? This medication is injected into a vein or under the skin. It is given by your care team in a hospital or clinic setting. Talk to your care team about the use of this medication in children. Special care may be needed. Overdosage: If you think you have taken too much of this medicine contact a poison control center or emergency room at once. NOTE: This medicine is only for you. Do not share this medicine with others. What if I miss a dose? Keep appointments for follow-up doses. It is important not to miss your dose. Call your care team if you are unable to keep an appointment. What may interact with this medication? Ketoconazole Rifampin This list may not describe all possible interactions. Give your health care provider a list of all the medicines, herbs, non-prescription drugs, or dietary supplements you use. Also tell them if you smoke, drink alcohol, or use illegal drugs. Some items may interact with your medicine. What should I watch for while using this medication? Your condition will be monitored carefully while you are receiving this medication. You may need blood work while taking this medication. This medication may affect your coordination, reaction time, or judgment. Do not drive or operate machinery until you know how this medication affects you. Sit up or stand slowly to reduce the risk of dizzy or fainting spells. Drinking alcohol with this medication can increase the risk of these side effects. This medication may increase your risk of getting an infection. Call your care team for advice if you get a fever, chills, sore throat, or other symptoms of a cold or flu. Do not treat yourself. Try to avoid being around people who are sick. Check with your care team if you have severe diarrhea, nausea, and  vomiting, or if you sweat a lot. The loss of too much body fluid may make it dangerous for you to take this medication. Talk to your care team if you may be pregnant. Serious birth defects can occur if you take this medication during pregnancy and for 7 months after the last dose. You will need a negative pregnancy test before starting this medication. Contraception is recommended while taking this medication and for 7 months after the last dose. Your care team can help you find the option that works for you. If your partner can get pregnant, use a condom during sex while taking this medication and for 4 months after the last dose. Do not breastfeed while taking this medication and for 2 months after the last dose. This medication may cause infertility. Talk to your care team if you are concerned about your fertility. What side effects may I notice from receiving this medication? Side effects that you should report to your care team as soon as possible: Allergic reactions--skin rash, itching, hives, swelling of the face, lips, tongue, or throat Bleeding--bloody or black, tar-like stools, vomiting blood or brown material that looks like coffee grounds, red or dark brown urine, small red or purple spots on skin, unusual bruising   bruising or bleeding Bleeding in the brain--severe headache, stiff neck, confusion, dizziness, change in vision, numbness or weakness of the face, arm, or leg, trouble speaking, trouble walking, vomiting Bowel blockage--stomach cramping, unable to have a bowel movement or pass gas, loss of appetite, vomiting Heart failure--shortness of breath, swelling of the ankles, feet, or hands, sudden weight gain, unusual weakness or fatigue Infection--fever, chills, cough, sore throat, wounds that don't heal, pain or trouble when passing urine, general feeling of discomfort or being unwell Liver injury--right upper belly pain, loss of appetite, nausea, light-colored stool, dark yellow or brown urine,  yellowing skin or eyes, unusual weakness or fatigue Low blood pressure--dizziness, feeling faint or lightheaded, blurry vision Lung injury--shortness of breath or trouble breathing, cough, spitting up blood, chest pain, fever Pain, tingling, or numbness in the hands or feet Severe or prolonged diarrhea Stomach pain, bloody diarrhea, pale skin, unusual weakness or fatigue, decrease in the amount of urine, which may be signs of hemolytic uremic syndrome Sudden and severe headache, confusion, change in vision, seizures, which may be signs of posterior reversible encephalopathy syndrome (PRES) TTP--purple spots on the skin or inside the mouth, pale skin, yellowing skin or eyes, unusual weakness or fatigue, fever, fast or irregular heartbeat, confusion, change in vision, trouble speaking, trouble walking Tumor lysis syndrome (TLS)--nausea, vomiting, diarrhea, decrease in the amount of urine, dark urine, unusual weakness or fatigue, confusion, muscle pain or cramps, fast or irregular heartbeat, joint pain Side effects that usually do not require medical attention (report to your care team if they continue or are bothersome): Constipation Diarrhea Fatigue Loss of appetite Nausea This list may not describe all possible side effects. Call your doctor for medical advice about side effects. You may report side effects to FDA at 1-800-FDA-1088. Where should I keep my medication? This medication is given in a hospital or clinic. It will not be stored at home. NOTE: This sheet is a summary. It may not cover all possible information. If you have questions about this medicine, talk to your doctor, pharmacist, or health care provider.  2023 Elsevier/Gold Standard (2022-01-22 00:00:00) Cyclophosphamide Injection What is this medication? CYCLOPHOSPHAMIDE (sye kloe FOSS fa mide) treats some types of cancer. It works by slowing down the growth of cancer cells. This medicine may be used for other purposes; ask  your health care provider or pharmacist if you have questions. COMMON BRAND NAME(S): Cyclophosphamide, Cytoxan, Neosar What should I tell my care team before I take this medication? They need to know if you have any of these conditions: Heart disease Irregular heartbeat or rhythm Infection Kidney problems Liver disease Low blood cell levels (white cells, platelets, or red blood cells) Lung disease Previous radiation Trouble passing urine An unusual or allergic reaction to cyclophosphamide, other medications, foods, dyes, or preservatives Pregnant or trying to get pregnant Breast-feeding How should I use this medication? This medication is injected into a vein. It is given by your care team in a hospital or clinic setting. Talk to your care team about the use of this medication in children. Special care may be needed. Overdosage: If you think you have taken too much of this medicine contact a poison control center or emergency room at once. NOTE: This medicine is only for you. Do not share this medicine with others. What if I miss a dose? Keep appointments for follow-up doses. It is important not to miss your dose. Call your care team if you are unable to keep an appointment. What may interact  with this medication? Amphotericin B Amiodarone Azathioprine Certain antivirals for HIV or hepatitis Certain medications for blood pressure, such as enalapril, lisinopril, quinapril Cyclosporine Diuretics Etanercept Indomethacin Medications that relax muscles Metronidazole Natalizumab Tamoxifen Warfarin This list may not describe all possible interactions. Give your health care provider a list of all the medicines, herbs, non-prescription drugs, or dietary supplements you use. Also tell them if you smoke, drink alcohol, or use illegal drugs. Some items may interact with your medicine. What should I watch for while using this medication? This medication may make you feel generally unwell.  This is not uncommon as chemotherapy can affect healthy cells as well as cancer cells. Report any side effects. Continue your course of treatment even though you feel ill unless your care team tells you to stop. You may need blood work while you are taking this medication. This medication may increase your risk of getting an infection. Call your care team for advice if you get a fever, chills, sore throat, or other symptoms of a cold or flu. Do not treat yourself. Try to avoid being around people who are sick. Avoid taking medications that contain aspirin, acetaminophen, ibuprofen, naproxen, or ketoprofen unless instructed by your care team. These medications may hide a fever. Be careful brushing or flossing your teeth or using a toothpick because you may get an infection or bleed more easily. If you have any dental work done, tell your dentist you are receiving this medication. Drink water or other fluids as directed. Urinate often, even at night. Some products may contain alcohol. Ask your care team if this medication contains alcohol. Be sure to tell all care teams you are taking this medicine. Certain medicines, like metronidazole and disulfiram, can cause an unpleasant reaction when taken with alcohol. The reaction includes flushing, headache, nausea, vomiting, sweating, and increased thirst. The reaction can last from 30 minutes to several hours. Talk to your care team if you wish to become pregnant or think you might be pregnant. This medication can cause serious birth defects if taken during pregnancy and for 1 year after the last dose. A negative pregnancy test is required before starting this medication. A reliable form of contraception is recommended while taking this medication and for 1 year after the last dose. Talk to your care team about reliable forms of contraception. Do not father a child while taking this medication and for 4 months after the last dose. Use a condom during this time  period. Do not breast-feed while taking this medication or for 1 week after the last dose. This medication may cause infertility. Talk to your care team if you are concerned about your fertility. Talk to your care team about your risk of cancer. You may be more at risk for certain types of cancer if you take this medication. What side effects may I notice from receiving this medication? Side effects that you should report to your care team as soon as possible: Allergic reactions--skin rash, itching, hives, swelling of the face, lips, tongue, or throat Dry cough, shortness of breath or trouble breathing Heart failure--shortness of breath, swelling of the ankles, feet, or hands, sudden weight gain, unusual weakness or fatigue Heart muscle inflammation--unusual weakness or fatigue, shortness of breath, chest pain, fast or irregular heartbeat, dizziness, swelling of the ankles, feet, or hands Heart rhythm changes--fast or irregular heartbeat, dizziness, feeling faint or lightheaded, chest pain, trouble breathing Infection--fever, chills, cough, sore throat, wounds that don't heal, pain or trouble when passing urine, general  feeling of discomfort or being unwell Kidney injury--decrease in the amount of urine, swelling of the ankles, hands, or feet Liver injury--right upper belly pain, loss of appetite, nausea, light-colored stool, dark yellow or brown urine, yellowing skin or eyes, unusual weakness or fatigue Low red blood cell level--unusual weakness or fatigue, dizziness, headache, trouble breathing Low sodium level--muscle weakness, fatigue, dizziness, headache, confusion Red or dark brown urine Unusual bruising or bleeding Side effects that usually do not require medical attention (report to your care team if they continue or are bothersome): Hair loss Irregular menstrual cycles or spotting Loss of appetite Nausea Pain, redness, or swelling with sores inside the mouth or throat Vomiting This  list may not describe all possible side effects. Call your doctor for medical advice about side effects. You may report side effects to FDA at 1-800-FDA-1088. Where should I keep my medication? This medication is given in a hospital or clinic. It will not be stored at home. NOTE: This sheet is a summary. It may not cover all possible information. If you have questions about this medicine, talk to your doctor, pharmacist, or health care provider.  2023 Elsevier/Gold Standard (2021-12-04 00:00:00)

## 2022-04-28 ENCOUNTER — Inpatient Hospital Stay: Payer: No Typology Code available for payment source

## 2022-04-28 VITALS — BP 165/81 | HR 61 | Temp 98.2°F | Resp 18 | Wt 223.8 lb

## 2022-04-28 DIAGNOSIS — C9000 Multiple myeloma not having achieved remission: Secondary | ICD-10-CM

## 2022-04-28 DIAGNOSIS — Z5112 Encounter for antineoplastic immunotherapy: Secondary | ICD-10-CM | POA: Diagnosis not present

## 2022-04-28 LAB — CBC WITH DIFFERENTIAL (CANCER CENTER ONLY)
Abs Immature Granulocytes: 0.03 10*3/uL (ref 0.00–0.07)
Basophils Absolute: 0 10*3/uL (ref 0.0–0.1)
Basophils Relative: 0 %
Eosinophils Absolute: 0.1 10*3/uL (ref 0.0–0.5)
Eosinophils Relative: 2 %
HCT: 27.2 % — ABNORMAL LOW (ref 39.0–52.0)
Hemoglobin: 8.6 g/dL — ABNORMAL LOW (ref 13.0–17.0)
Immature Granulocytes: 1 %
Lymphocytes Relative: 46 %
Lymphs Abs: 1.4 10*3/uL (ref 0.7–4.0)
MCH: 32.1 pg (ref 26.0–34.0)
MCHC: 31.6 g/dL (ref 30.0–36.0)
MCV: 101.5 fL — ABNORMAL HIGH (ref 80.0–100.0)
Monocytes Absolute: 0.5 10*3/uL (ref 0.1–1.0)
Monocytes Relative: 16 %
Neutro Abs: 1 10*3/uL — ABNORMAL LOW (ref 1.7–7.7)
Neutrophils Relative %: 35 %
Platelet Count: 117 10*3/uL — ABNORMAL LOW (ref 150–400)
RBC: 2.68 MIL/uL — ABNORMAL LOW (ref 4.22–5.81)
RDW: 18.1 % — ABNORMAL HIGH (ref 11.5–15.5)
WBC Count: 3 10*3/uL — ABNORMAL LOW (ref 4.0–10.5)
nRBC: 1.7 % — ABNORMAL HIGH (ref 0.0–0.2)

## 2022-04-28 LAB — CMP (CANCER CENTER ONLY)
ALT: 22 U/L (ref 0–44)
AST: 29 U/L (ref 15–41)
Albumin: 3.8 g/dL (ref 3.5–5.0)
Alkaline Phosphatase: 61 U/L (ref 38–126)
Anion gap: 4 — ABNORMAL LOW (ref 5–15)
BUN: 15 mg/dL (ref 8–23)
CO2: 24 mmol/L (ref 22–32)
Calcium: 8.6 mg/dL — ABNORMAL LOW (ref 8.9–10.3)
Chloride: 110 mmol/L (ref 98–111)
Creatinine: 1.62 mg/dL — ABNORMAL HIGH (ref 0.61–1.24)
GFR, Estimated: 44 mL/min — ABNORMAL LOW (ref >60)
Glucose, Bld: 113 mg/dL — ABNORMAL HIGH (ref 70–99)
Potassium: 4.2 mmol/L (ref 3.5–5.1)
Sodium: 138 mmol/L (ref 135–145)
Total Bilirubin: 0.6 mg/dL (ref 0.3–1.2)
Total Protein: 5.7 g/dL — ABNORMAL LOW (ref 6.5–8.1)

## 2022-04-28 LAB — LACTATE DEHYDROGENASE: LDH: 255 U/L — ABNORMAL HIGH (ref 98–192)

## 2022-04-28 MED ORDER — BORTEZOMIB CHEMO SQ INJECTION 3.5 MG (2.5MG/ML)
1.5000 mg/m2 | Freq: Once | INTRAMUSCULAR | Status: AC
  Administered 2022-04-28: 3.5 mg via SUBCUTANEOUS
  Filled 2022-04-28: qty 1.4

## 2022-04-28 MED ORDER — SODIUM CHLORIDE 0.9 % IV SOLN
300.0000 mg/m2 | Freq: Once | INTRAVENOUS | Status: AC
  Administered 2022-04-28: 700 mg via INTRAVENOUS
  Filled 2022-04-28: qty 35

## 2022-04-28 MED ORDER — SODIUM CHLORIDE 0.9 % IV SOLN: Freq: Once | INTRAVENOUS | Status: AC

## 2022-04-28 MED ORDER — PALONOSETRON HCL INJECTION 0.25 MG/5ML
0.2500 mg | Freq: Once | INTRAVENOUS | Status: AC
  Administered 2022-04-28: 0.25 mg via INTRAVENOUS
  Filled 2022-04-28: qty 5

## 2022-04-28 NOTE — Patient Instructions (Signed)
[facility]COL[facility]  Discharge Instructions: Thank you for choosing [facility] to provide your oncology and hematology care.   If you have a lab appointment with the Cancer Center, please go directly to the Cancer Center and check in at the registration area.   Wear comfortable clothing and clothing appropriate for easy access to any Portacath or PICC line.   We strive to give you quality time with your provider. You may need to reschedule your appointment if you arrive late (15 or more minutes).  Arriving late affects you and other patients whose appointments are after yours.  Also, if you miss three or more appointments without notifying the office, you may be dismissed from the clinic at the provider's discretion.      For prescription refill requests, have your pharmacy contact our office and allow 72 hours for refills to be completed.    Today you received the following chemotherapy and/or immunotherapy agents: Velcade, Cytoxan.       To help prevent nausea and vomiting after your treatment, we encourage you to take your nausea medication as directed.  BELOW ARE SYMPTOMS THAT SHOULD BE REPORTED IMMEDIATELY: *FEVER GREATER THAN 100.4 F (38 C) OR HIGHER *CHILLS OR SWEATING *NAUSEA AND VOMITING THAT IS NOT CONTROLLED WITH YOUR NAUSEA MEDICATION *UNUSUAL SHORTNESS OF BREATH *UNUSUAL BRUISING OR BLEEDING *URINARY PROBLEMS (pain or burning when urinating, or frequent urination) *BOWEL PROBLEMS (unusual diarrhea, constipation, pain near the anus) TENDERNESS IN MOUTH AND THROAT WITH OR WITHOUT PRESENCE OF ULCERS (sore throat, sores in mouth, or a toothache) UNUSUAL RASH, SWELLING OR PAIN  UNUSUAL VAGINAL DISCHARGE OR ITCHING   Items with * indicate a potential emergency and should be followed up as soon as possible or go to the Emergency Department if any problems should occur.  Please show the CHEMOTHERAPY ALERT CARD or IMMUNOTHERAPY ALERT CARD at  check-in to the Emergency Department and triage nurse.  Should you have questions after your visit or need to cancel or reschedule your appointment, please contact [facility]  Dept: [phone]  and follow the prompts.  Office hours are 8:00 a.m. to 4:30 p.m. Monday - Friday. Please note that voicemails left after 4:00 p.m. may not be returned until the following business day.  We are closed weekends and major holidays. You have access to a nurse at all times for urgent questions. Please call the main number to the clinic Dept: [phone] and follow the prompts.   For any non-urgent questions, you may also contact your provider using MyChart. We now offer e-Visits for anyone 18 and older to request care online for non-urgent symptoms. For details visit [email or web address].   Also download the MyChart app! Go to the app store, search "MyChart", open the app, select [facility], and log in with your MyChart username and password.  Masks are optional in the cancer centers. If you would like for your care team to wear a mask while they are taking care of you, please let them know. You may have one support person who is at least 77 years old accompany you for your appointments. 

## 2022-04-28 NOTE — Progress Notes (Signed)
Per Dede Query PA, ok to treat with ANC 1.0 and SCR 1.62

## 2022-05-05 ENCOUNTER — Inpatient Hospital Stay: Payer: No Typology Code available for payment source

## 2022-05-05 ENCOUNTER — Other Ambulatory Visit: Payer: Self-pay

## 2022-05-05 ENCOUNTER — Inpatient Hospital Stay (HOSPITAL_BASED_OUTPATIENT_CLINIC_OR_DEPARTMENT_OTHER): Payer: No Typology Code available for payment source | Admitting: Hematology and Oncology

## 2022-05-05 VITALS — BP 175/86 | HR 60 | Temp 98.4°F | Resp 18 | Wt 223.6 lb

## 2022-05-05 DIAGNOSIS — D631 Anemia in chronic kidney disease: Secondary | ICD-10-CM | POA: Diagnosis not present

## 2022-05-05 DIAGNOSIS — C9000 Multiple myeloma not having achieved remission: Secondary | ICD-10-CM

## 2022-05-05 DIAGNOSIS — N184 Chronic kidney disease, stage 4 (severe): Secondary | ICD-10-CM

## 2022-05-05 DIAGNOSIS — Z5112 Encounter for antineoplastic immunotherapy: Secondary | ICD-10-CM | POA: Diagnosis not present

## 2022-05-05 LAB — CMP (CANCER CENTER ONLY)
ALT: 23 U/L (ref 0–44)
AST: 29 U/L (ref 15–41)
Albumin: 3.9 g/dL (ref 3.5–5.0)
Alkaline Phosphatase: 64 U/L (ref 38–126)
Anion gap: 3 — ABNORMAL LOW (ref 5–15)
BUN: 11 mg/dL (ref 8–23)
CO2: 27 mmol/L (ref 22–32)
Calcium: 8.9 mg/dL (ref 8.9–10.3)
Chloride: 108 mmol/L (ref 98–111)
Creatinine: 1.67 mg/dL — ABNORMAL HIGH (ref 0.61–1.24)
GFR, Estimated: 42 mL/min — ABNORMAL LOW (ref >60)
Glucose, Bld: 103 mg/dL — ABNORMAL HIGH (ref 70–99)
Potassium: 4.5 mmol/L (ref 3.5–5.1)
Sodium: 138 mmol/L (ref 135–145)
Total Bilirubin: 0.7 mg/dL (ref 0.3–1.2)
Total Protein: 5.8 g/dL — ABNORMAL LOW (ref 6.5–8.1)

## 2022-05-05 LAB — CBC WITH DIFFERENTIAL (CANCER CENTER ONLY)
Abs Immature Granulocytes: 0.01 10*3/uL (ref 0.00–0.07)
Basophils Absolute: 0 10*3/uL (ref 0.0–0.1)
Basophils Relative: 0 %
Eosinophils Absolute: 0.1 10*3/uL (ref 0.0–0.5)
Eosinophils Relative: 2 %
HCT: 28.8 % — ABNORMAL LOW (ref 39.0–52.0)
Hemoglobin: 9.4 g/dL — ABNORMAL LOW (ref 13.0–17.0)
Immature Granulocytes: 0 %
Lymphocytes Relative: 37 %
Lymphs Abs: 0.9 10*3/uL (ref 0.7–4.0)
MCH: 32.9 pg (ref 26.0–34.0)
MCHC: 32.6 g/dL (ref 30.0–36.0)
MCV: 100.7 fL — ABNORMAL HIGH (ref 80.0–100.0)
Monocytes Absolute: 0.3 10*3/uL (ref 0.1–1.0)
Monocytes Relative: 13 %
Neutro Abs: 1.2 10*3/uL — ABNORMAL LOW (ref 1.7–7.7)
Neutrophils Relative %: 48 %
Platelet Count: 95 10*3/uL — ABNORMAL LOW (ref 150–400)
RBC: 2.86 MIL/uL — ABNORMAL LOW (ref 4.22–5.81)
RDW: 18.6 % — ABNORMAL HIGH (ref 11.5–15.5)
WBC Count: 2.5 10*3/uL — ABNORMAL LOW (ref 4.0–10.5)
nRBC: 1.2 % — ABNORMAL HIGH (ref 0.0–0.2)

## 2022-05-05 LAB — LACTATE DEHYDROGENASE: LDH: 292 U/L — ABNORMAL HIGH (ref 98–192)

## 2022-05-05 MED ORDER — PALONOSETRON HCL INJECTION 0.25 MG/5ML
0.2500 mg | Freq: Once | INTRAVENOUS | Status: AC
  Administered 2022-05-05: 0.25 mg via INTRAVENOUS
  Filled 2022-05-05: qty 5

## 2022-05-05 MED ORDER — BORTEZOMIB CHEMO SQ INJECTION 3.5 MG (2.5MG/ML)
1.5000 mg/m2 | Freq: Once | INTRAMUSCULAR | Status: AC
  Administered 2022-05-05: 3.5 mg via SUBCUTANEOUS
  Filled 2022-05-05: qty 1.4

## 2022-05-05 MED ORDER — SODIUM CHLORIDE 0.9 % IV SOLN: Freq: Once | INTRAVENOUS | Status: AC

## 2022-05-05 MED ORDER — SODIUM CHLORIDE 0.9 % IV SOLN
300.0000 mg/m2 | Freq: Once | INTRAVENOUS | Status: AC
  Administered 2022-05-05: 700 mg via INTRAVENOUS
  Filled 2022-05-05: qty 35

## 2022-05-05 NOTE — Progress Notes (Signed)
Per patient, home dose of decadron taken prior to infusion appointment.

## 2022-05-05 NOTE — Patient Instructions (Signed)
[facility]  Discharge Instructions: Thank you for choosing [facility] to provide your oncology and hematology care.   If you have a lab appointment with the [facility], please go directly to the [facility] and check in at the registration area.   Wear comfortable clothing and clothing appropriate for easy access to any Portacath or PICC line.   We strive to give you quality time with your provider. You may need to reschedule your appointment if you arrive late (15 or more minutes).  Arriving late affects you and other patients whose appointments are after yours.  Also, if you miss three or more appointments without notifying the office, you may be dismissed from the clinic at the provider's discretion.      For prescription refill requests, have your pharmacy contact our office and allow 72 hours for refills to be completed.    Today you received the following chemotherapy and/or immunotherapy agents: Bortezomib (Velcade) and Cytoxan   To help prevent nausea and vomiting after your treatment, we encourage you to take your nausea medication as directed.  BELOW ARE SYMPTOMS THAT SHOULD BE REPORTED IMMEDIATELY: *FEVER GREATER THAN 100.4 F (38 C) OR HIGHER *CHILLS OR SWEATING *NAUSEA AND VOMITING THAT IS NOT CONTROLLED WITH YOUR NAUSEA MEDICATION *UNUSUAL SHORTNESS OF BREATH *UNUSUAL BRUISING OR BLEEDING *URINARY PROBLEMS (pain or burning when urinating, or frequent urination) *BOWEL PROBLEMS (unusual diarrhea, constipation, pain near the anus) TENDERNESS IN MOUTH AND THROAT WITH OR WITHOUT PRESENCE OF ULCERS (sore throat, sores in mouth, or a toothache) UNUSUAL RASH, SWELLING OR PAIN  UNUSUAL VAGINAL DISCHARGE OR ITCHING   Items with * indicate a potential emergency and should be followed up as soon as possible or go to the Emergency Department if any problems should occur.  Please show the CHEMOTHERAPY ALERT CARD or IMMUNOTHERAPY ALERT  CARD at check-in to the Emergency Department and triage nurse.  Should you have questions after your visit or need to cancel or reschedule your appointment, please contact [facility]  Dept: [phone]  and follow the prompts.  Office hours are 8:00 a.m. to 4:30 p.m. Monday - Friday. Please note that voicemails left after 4:00 p.m. may not be returned until the following business day.  We are closed weekends and major holidays. You have access to a nurse at all times for urgent questions. Please call the main number to the clinic Dept: [phone] and follow the prompts.   For any non-urgent questions, you may also contact your provider using MyChart. We now offer e-Visits for anyone 53 and older to request care online for non-urgent symptoms. For details visit [email or web address].   Also download the MyChart app! Go to the app store, search "MyChart", open the app, select [facility], and log in with your MyChart username and password.  Masks are optional in the cancer centers. If you would like for your care team to wear a mask while they are taking care of you, please let them know. You may have one support person who is at least 77 years old accompany you for your appointments. Bortezomib Injection What is this medication? BORTEZOMIB (bor TEZ oh mib) treats lymphoma. It may also be used to treat multiple myeloma, a type of bone marrow cancer. It works by blocking a protein that causes cancer cells to grow and multiply. This helps to slow or stop the spread of cancer cells. This medicine may be used for other purposes; ask  care provider or pharmacist if you have questions. COMMON BRAND NAME(S): Velcade What should I tell my care team before I take this medication? They need to know if you have any of these conditions: Dehydration Diabetes Heart disease Liver disease Tingling of the fingers or toes or other nerve disorder An unusual or allergic  reaction to bortezomib, other medications, foods, dyes, or preservatives If you or your partner are pregnant or trying to get pregnant Breastfeeding How should I use this medication? This medication is injected into a vein or under the skin. It is given by your care team in a hospital or clinic setting. Talk to your care team about the use of this medication in children. Special care may be needed. Overdosage: If you think you have taken too much of this medicine contact a poison control center or emergency room at once. NOTE: This medicine is only for you. Do not share this medicine with others. What if I miss a dose? Keep appointments for follow-up doses. It is important not to miss your dose. Call your care team if you are unable to keep an appointment. What may interact with this medication? Ketoconazole Rifampin This list may not describe all possible interactions. Give your health care provider a list of all the medicines, herbs, non-prescription drugs, or dietary supplements you use. Also tell them if you smoke, drink alcohol, or use illegal drugs. Some items may interact with your medicine. What should I watch for while using this medication? Your condition will be monitored carefully while you are receiving this medication. You may need blood work while taking this medication. This medication may affect your coordination, reaction time, or judgment. Do not drive or operate machinery until you know how this medication affects you. Sit up or stand slowly to reduce the risk of dizzy or fainting spells. Drinking alcohol with this medication can increase the risk of these side effects. This medication may increase your risk of getting an infection. Call your care team for advice if you get a fever, chills, sore throat, or other symptoms of a cold or flu. Do not treat yourself. Try to avoid being around people who are sick. Check with your care team if you have severe diarrhea, nausea, and  vomiting, or if you sweat a lot. The loss of too much body fluid may make it dangerous for you to take this medication. Talk to your care team if you may be pregnant. Serious birth defects can occur if you take this medication during pregnancy and for 7 months after the last dose. You will need a negative pregnancy test before starting this medication. Contraception is recommended while taking this medication and for 7 months after the last dose. Your care team can help you find the option that works for you. If your partner can get pregnant, use a condom during sex while taking this medication and for 4 months after the last dose. Do not breastfeed while taking this medication and for 2 months after the last dose. This medication may cause infertility. Talk to your care team if you are concerned about your fertility. What side effects may I notice from receiving this medication? Side effects that you should report to your care team as soon as possible: Allergic reactions--skin rash, itching, hives, swelling of the face, lips, tongue, or throat Bleeding--bloody or black, tar-like stools, vomiting blood or brown material that looks like coffee grounds, red or dark brown urine, small red or purple spots on skin, unusual bruising   unusual bruising or bleeding Bleeding in the brain--severe headache, stiff neck, confusion, dizziness, change in vision, numbness or weakness of the face, arm, or leg, trouble speaking, trouble walking, vomiting Bowel blockage--stomach cramping, unable to have a bowel movement or pass gas, loss of appetite, vomiting Heart failure--shortness of breath, swelling of the ankles, feet, or hands, sudden weight gain, unusual weakness or fatigue Infection--fever, chills, cough, sore throat, wounds that don't heal, pain or trouble when passing urine, general feeling of discomfort or being unwell Liver injury--right upper belly pain, loss of appetite, nausea, light-colored stool, dark yellow or brown urine,  yellowing skin or eyes, unusual weakness or fatigue Low blood pressure--dizziness, feeling faint or lightheaded, blurry vision Lung injury--shortness of breath or trouble breathing, cough, spitting up blood, chest pain, fever Pain, tingling, or numbness in the hands or feet Severe or prolonged diarrhea Stomach pain, bloody diarrhea, pale skin, unusual weakness or fatigue, decrease in the amount of urine, which may be signs of hemolytic uremic syndrome Sudden and severe headache, confusion, change in vision, seizures, which may be signs of posterior reversible encephalopathy syndrome (PRES) TTP--purple spots on the skin or inside the mouth, pale skin, yellowing skin or eyes, unusual weakness or fatigue, fever, fast or irregular heartbeat, confusion, change in vision, trouble speaking, trouble walking Tumor lysis syndrome (TLS)--nausea, vomiting, diarrhea, decrease in the amount of urine, dark urine, unusual weakness or fatigue, confusion, muscle pain or cramps, fast or irregular heartbeat, joint pain Side effects that usually do not require medical attention (report to your care team if they continue or are bothersome): Constipation Diarrhea Fatigue Loss of appetite Nausea This list may not describe all possible side effects. Call your doctor for medical advice about side effects. You may report side effects to FDA at 1-800-FDA-1088. Where should I keep my medication? This medication is given in a hospital or clinic. It will not be stored at home. NOTE: This sheet is a summary. It may not cover all possible information. If you have questions about this medicine, talk to your doctor, pharmacist, or health care provider.  2023 Elsevier/Gold Standard (2022-01-22 00:00:00) Cyclophosphamide Injection What is this medication? CYCLOPHOSPHAMIDE (sye kloe FOSS fa mide) treats some types of cancer. It works by slowing down the growth of cancer cells. This medicine may be used for other purposes; ask  your health care provider or pharmacist if you have questions. COMMON BRAND NAME(S): Cyclophosphamide, Cytoxan, Neosar What should I tell my care team before I take this medication? They need to know if you have any of these conditions: Heart disease Irregular heartbeat or rhythm Infection Kidney problems Liver disease Low blood cell levels (white cells, platelets, or red blood cells) Lung disease Previous radiation Trouble passing urine An unusual or allergic reaction to cyclophosphamide, other medications, foods, dyes, or preservatives Pregnant or trying to get pregnant Breast-feeding How should I use this medication? This medication is injected into a vein. It is given by your care team in a hospital or clinic setting. Talk to your care team about the use of this medication in children. Special care may be needed. Overdosage: If you think you have taken too much of this medicine contact a poison control center or emergency room at once. NOTE: This medicine is only for you. Do not share this medicine with others. What if I miss a dose? Keep appointments for follow-up doses. It is important not to miss your dose. Call your care team if you are unable to keep an appointment. What may  interact with this medication? Amphotericin B Amiodarone Azathioprine Certain antivirals for HIV or hepatitis Certain medications for blood pressure, such as enalapril, lisinopril, quinapril Cyclosporine Diuretics Etanercept Indomethacin Medications that relax muscles Metronidazole Natalizumab Tamoxifen Warfarin This list may not describe all possible interactions. Give your health care provider a list of all the medicines, herbs, non-prescription drugs, or dietary supplements you use. Also tell them if you smoke, drink alcohol, or use illegal drugs. Some items may interact with your medicine. What should I watch for while using this medication? This medication may make you feel generally unwell.  This is not uncommon as chemotherapy can affect healthy cells as well as cancer cells. Report any side effects. Continue your course of treatment even though you feel ill unless your care team tells you to stop. You may need blood work while you are taking this medication. This medication may increase your risk of getting an infection. Call your care team for advice if you get a fever, chills, sore throat, or other symptoms of a cold or flu. Do not treat yourself. Try to avoid being around people who are sick. Avoid taking medications that contain aspirin, acetaminophen, ibuprofen, naproxen, or ketoprofen unless instructed by your care team. These medications may hide a fever. Be careful brushing or flossing your teeth or using a toothpick because you may get an infection or bleed more easily. If you have any dental work done, tell your dentist you are receiving this medication. Drink water or other fluids as directed. Urinate often, even at night. Some products may contain alcohol. Ask your care team if this medication contains alcohol. Be sure to tell all care teams you are taking this medicine. Certain medicines, like metronidazole and disulfiram, can cause an unpleasant reaction when taken with alcohol. The reaction includes flushing, headache, nausea, vomiting, sweating, and increased thirst. The reaction can last from 30 minutes to several hours. Talk to your care team if you wish to become pregnant or think you might be pregnant. This medication can cause serious birth defects if taken during pregnancy and for 1 year after the last dose. A negative pregnancy test is required before starting this medication. A reliable form of contraception is recommended while taking this medication and for 1 year after the last dose. Talk to your care team about reliable forms of contraception. Do not father a child while taking this medication and for 4 months after the last dose. Use a condom during this time  period. Do not breast-feed while taking this medication or for 1 week after the last dose. This medication may cause infertility. Talk to your care team if you are concerned about your fertility. Talk to your care team about your risk of cancer. You may be more at risk for certain types of cancer if you take this medication. What side effects may I notice from receiving this medication? Side effects that you should report to your care team as soon as possible: Allergic reactions--skin rash, itching, hives, swelling of the face, lips, tongue, or throat Dry cough, shortness of breath or trouble breathing Heart failure--shortness of breath, swelling of the ankles, feet, or hands, sudden weight gain, unusual weakness or fatigue Heart muscle inflammation--unusual weakness or fatigue, shortness of breath, chest pain, fast or irregular heartbeat, dizziness, swelling of the ankles, feet, or hands Heart rhythm changes--fast or irregular heartbeat, dizziness, feeling faint or lightheaded, chest pain, trouble breathing Infection--fever, chills, cough, sore throat, wounds that don't heal, pain or trouble when passing urine,  general feeling of discomfort or being unwell Kidney injury--decrease in the amount of urine, swelling of the ankles, hands, or feet Liver injury--right upper belly pain, loss of appetite, nausea, light-colored stool, dark yellow or brown urine, yellowing skin or eyes, unusual weakness or fatigue Low red blood cell level--unusual weakness or fatigue, dizziness, headache, trouble breathing Low sodium level--muscle weakness, fatigue, dizziness, headache, confusion Red or dark brown urine Unusual bruising or bleeding Side effects that usually do not require medical attention (report to your care team if they continue or are bothersome): Hair loss Irregular menstrual cycles or spotting Loss of appetite Nausea Pain, redness, or swelling with sores inside the mouth or throat Vomiting This  list may not describe all possible side effects. Call your doctor for medical advice about side effects. You may report side effects to FDA at 1-800-FDA-1088. Where should I keep my medication? This medication is given in a hospital or clinic. It will not be stored at home. NOTE: This sheet is a summary. It may not cover all possible information. If you have questions about this medicine, talk to your doctor, pharmacist, or health care provider.  2023 Elsevier/Gold Standard (2021-12-04 00:00:00)

## 2022-05-05 NOTE — Progress Notes (Signed)
Per Dr. [name], okay to treat with ANC 1.2, PLT 95, SCr 1.67.

## 2022-05-06 ENCOUNTER — Encounter: Payer: Self-pay | Admitting: Hematology and Oncology

## 2022-05-06 LAB — KAPPA/LAMBDA LIGHT CHAINS
Kappa free light chain: 10.6 mg/L (ref 3.3–19.4)
Kappa, lambda light chain ratio: 1.74 — ABNORMAL HIGH (ref 0.26–1.65)
Lambda free light chains: 6.1 mg/L (ref 5.7–26.3)

## 2022-05-06 NOTE — Progress Notes (Signed)
[facility] - Multiple Myeloma and Other Plasma Cell Dyscrasias  No Change  Continue With Treatment as Ordered.  Original Decision Date/Time: 01/30/2022 16:34     A cycle is every 28 days:     Dexamethasone      Bortezomib      Cyclophosphamide   **Always confirm dose/schedule in your pharmacy ordering system**  Patient Characteristics: Multiple Myeloma, Newly Diagnosed, Transplant Ineligible or Refused, Unknown or Awaiting Test Results Disease Classification: Multiple Myeloma R-ISS Staging: II Therapeutic Status: Newly Diagnosed Is Patient Eligible for Transplant<= Transplant Ineligible or Refused Risk Status: Awaiting Test Results Intent of Therapy: Curative Intent, Discussed with Patient

## 2022-05-06 NOTE — Progress Notes (Signed)
Darren Hinton   [phone]  PROGRESS NOTE  Patient Care Team: Darren Hinton as PCP - General (Internal Medicine)  Hematological/Oncological History # IgA Lambda Multiple Myeloma 05/03/2015: last visit with Dr. Julien Hinton at the [facility]. Was followed for IgA lambda MGUS. 12/11/2021: labs show M protein 3.4, Kappa 19.2, Lambda 1576.4, ratio 0.01. Cr 3.47, Hgb 8.6, WBC 5.7, MCV 99, Plt 221 12/23/2021: establish care with Dr. Lorenso Hinton  01/16/2022: Bone marrow biopsy performed, showed a 60% cellular bone marrow predominantly comprised of plasma cells making up 70 to 80%, lambda restricted.  Myeloma FISH panel showed no evidence of abnormalities. 02/07/2022: Cycle 1 Day 1 of CyBorD chemotherapy.  03/09/2022-03/17/2022: hospitalized for fever/pneumonia.  03/24/2022: Cycle 2 Day 1 of CyBorD chemotherapy.  04/22/2022: Cycle 3 Day 1 of CyBorD chemotherapy  Interval History:  Darren Hinton. 76 y.o. male with medical history significant for IgA lambda multiple myeloma who presents for a follow up visit. The patient's last visit was on 04/22/2022. In the interim since the last visit he has had no major changes in his health.   On exam today Darren Hinton reports his treatments have been going quite well.  He is not having any major side effects.  He does struggle with some constipation and does his best to keep his bowels moving.  He reports that he takes senna occasional some MiraLAX to help move his bowels.  He also drinks a tea at night which he believes helps.  He is doing his best to increase intake of water.  He notes that he is delighted to hear he has increased his weight by 3 pounds in the interim since her last visit.  He is not having any trouble with shortness of breath, lightheadedness, or dizziness.  He has good balance and has had no unsteadiness on his feet.  Overall he is tolerating treatment well and is willing and able to proceed at this  time.  Patient denies any fevers, chills, night sweats, shortness of breath, chest pain, cough, neuropathy, peripheral edema, headaches or dizziness.  He has no other complaints.  A full 10 point ROS is otherwise negative.  MEDICAL HISTORY:  Past Medical History:  Diagnosis Date   Allergy    Anxiety    Arthritis    Depression    Heart murmur    Hyperlipidemia    Hyperplastic colon polyp    Hyperproteinemia    Hypertension    Hypothyroidism    Mitral valve prolapse    Paroxysmal atrial fibrillation (HCC)    Thyroid disease     SURGICAL HISTORY: Past Surgical History:  Procedure Laterality Date   CARDIOVERSION N/A 03/14/2022   Procedure: CARDIOVERSION;  Surgeon: Darren Casino, Hinton;  Location: [facility];  Service: Cardiovascular;  Laterality: N/A;   TEE WITHOUT CARDIOVERSION N/A 03/14/2022   Procedure: TRANSESOPHAGEAL ECHOCARDIOGRAM (TEE);  Surgeon: Darren Casino, Hinton;  Location: [facility];  Service: Cardiovascular;  Laterality: N/A;    SOCIAL HISTORY: Social History   Socioeconomic History   Marital status: Married    Spouse name: Not on file   Number of children: Not on file   Years of education: Not on file   Highest education level: Not on file  Occupational History   Not on file  Tobacco Use   Smoking status: Former    Years: 20.00    Types: Cigarettes    Quit date: 10/01/1978    Years since quitting: 47.6  Smokeless tobacco: Never   Tobacco comments:    Former smoker 04/03/22  Substance and Sexual Activity   Alcohol use: Yes    Alcohol/week: 2.0 standard drinks of alcohol    Types: 2 Glasses of wine per week    Comment: occasionally   Drug use: No   Sexual activity: Not on file  Other Topics Concern   Not on file  Social History Narrative   Not on file   Social Determinants of Health   Financial Resource Strain: Not on file  Food Insecurity: Not on file  Transportation Needs: Not on file  Physical Activity: Not on file  Stress: Not on file   Social Connections: Not on file  Intimate Partner Violence: Not on file    FAMILY HISTORY: Family History  Problem Relation Age of Onset   Cancer Father    Colon cancer Neg Hx    Esophageal cancer Neg Hx    Liver cancer Neg Hx    Pancreatic cancer Neg Hx    Rectal cancer Neg Hx    Stomach cancer Neg Hx     ALLERGIES:  is allergic to zithromax [azithromycin].  MEDICATIONS:  Current Outpatient Medications  Medication Sig Dispense Refill   acyclovir (ZOVIRAX) 400 MG tablet Take 1 tablet (400 mg total) by mouth 2 (two) times daily. 180 tablet 1   allopurinol (ZYLOPRIM) 300 MG tablet Take 1 tablet (300 mg total) by mouth daily. 90 tablet 1   ALPRAZolam (XANAX) 0.25 MG tablet Take 0.25 mg by mouth daily as needed. Anxiety      amLODipine (NORVASC) 5 MG tablet Take 5 mg by mouth 2 (two) times daily.     apixaban (ELIQUIS) 5 MG TABS tablet Take 1 tablet (5 mg total) by mouth 2 (two) times daily. 180 tablet 1   atorvastatin (LIPITOR) 20 MG tablet Take 20 mg by mouth daily.     dexamethasone (DECADRON) 4 MG tablet Take 10 tablets (40 mg total) by mouth once a week. Take steroids pills the in the morning on chemotherapy days. 120 tablet 1   ergocalciferol (VITAMIN D2) 1.25 MG (50000 UT) capsule Take 50,000 Units by mouth every Wednesday.     ferrous gluconate (FERGON) 324 MG tablet Take 1 tablet (324 mg total) by mouth 3 (three) times daily with meals. 90 tablet 0   fluticasone (FLONASE) 50 MCG/ACT nasal spray Place 2 sprays into the nose daily as needed. Allergies      gabapentin (NEURONTIN) 300 MG capsule Take 300 mg by mouth daily.     levothyroxine (SYNTHROID) 100 MCG tablet Take 100 mcg by mouth daily before breakfast.     loratadine (CLARITIN) 10 MG tablet Take 10 mg by mouth daily.     meclizine (ANTIVERT) 25 MG tablet Take 1 tablet (25 mg total) by mouth 3 (three) times daily as needed for dizziness. 30 tablet 0   metoprolol succinate (TOPROL-XL) 25 MG 24 hr tablet Take 1 tablet  (25 mg total) by mouth daily. Take with or immediately following a meal. 30 tablet 3   ondansetron (ZOFRAN) 8 MG tablet Take 1 tablet (8 mg total) by mouth every 8 (eight) hours as needed. 30 tablet 0   prochlorperazine (COMPAZINE) 10 MG tablet Take 1 tablet (10 mg total) by mouth every 6 (six) hours as needed for nausea or vomiting. 30 tablet 0   senna-docusate (SENOKOT-S) 8.6-50 MG tablet Take 1 tablet by mouth at bedtime as needed for mild constipation.     vitamin  B-12 (CYANOCOBALAMIN) 500 MCG tablet Take 1 tablet by mouth daily.     amiodarone (PACERONE) 200 MG tablet Take 1 tablet (200 mg total) by mouth daily. 30 tablet 3   Current Facility-Administered Medications  Medication Dose Route Frequency Provider Last Rate Last Admin   0.9 %  sodium chloride infusion  500 mL Intravenous Once Armbruster, Carlota Raspberry, Hinton        REVIEW OF SYSTEMS:   Constitutional: ( - ) fevers, ( - )  chills , ( - ) night sweats Eyes: ( - ) blurriness of vision, ( - ) double vision, ( - ) watery eyes Ears, nose, mouth, throat, and face: ( - ) mucositis, ( - ) sore throat Respiratory: ( - ) cough, ( - ) dyspnea, ( - ) wheezes Cardiovascular: ( - ) palpitation, ( - ) chest discomfort, ( - ) lower extremity swelling Gastrointestinal:  ( - ) nausea, ( - ) heartburn, ( - ) change in bowel habits Skin: ( - ) abnormal skin rashes Lymphatics: ( - ) new lymphadenopathy, ( - ) easy bruising Neurological: ( - ) numbness, ( - ) tingling, ( - ) new weaknesses Behavioral/Psych: ( - ) mood change, ( - ) new changes  All other systems were reviewed with the patient and are negative.  PHYSICAL EXAMINATION: ECOG PERFORMANCE STATUS: 1 - Symptomatic but completely ambulatory  Vitals:   05/05/22 1303  BP: (!) 175/86  Pulse: 60  Resp: 18  Temp: 98.4 F (36.9 C)  SpO2: 99%     Filed Weights   05/05/22 1303  Weight: 223 lb 9.6 oz (101.4 kg)   GENERAL: Chronically ill-appearing elderly African-American male, alert, no  distress and comfortable SKIN: skin color, texture, turgor are normal, no rashes or significant lesions EYES: conjunctiva are pink and non-injected, sclera clear LUNGS: clear to auscultation and percussion with normal breathing effort HEART: regular rate & rhythm and no murmurs and no lower extremity edema Musculoskeletal: no cyanosis of digits and no clubbing  PSYCH: alert & oriented x 3, fluent speech NEURO: no focal motor/sensory deficits  LABORATORY DATA:  I have reviewed the data as listed    Latest Ref Rng & Units 05/05/2022   12:36 PM 04/28/2022   10:10 AM 04/22/2022    9:37 AM  CBC  WBC 4.0 - 10.5 K/uL 2.5  3.0  4.5   Hemoglobin 13.0 - 17.0 g/dL 9.4  8.6  9.4   Hematocrit 39.0 - 52.0 % 28.8  27.2  29.2   Platelets 150 - 400 K/uL 95  117  178        Latest Ref Rng & Units 05/05/2022   12:36 PM 04/28/2022   10:10 AM 04/22/2022    9:37 AM  CMP  Glucose 70 - 99 mg/dL 103  113  109   BUN 8 - 23 mg/dL '11  15  11   ' Creatinine 0.61 - 1.24 mg/dL 1.67  1.62  2.00   Sodium 135 - 145 mmol/L 138  138  140   Potassium 3.5 - 5.1 mmol/L 4.5  4.2  4.3   Chloride 98 - 111 mmol/L 108  110  110   CO2 22 - 32 mmol/L '27  24  24   ' Calcium 8.9 - 10.3 mg/dL 8.9  8.6  9.2   Total Protein 6.5 - 8.1 g/dL 5.8  5.7  6.2   Total Bilirubin 0.3 - 1.2 mg/dL 0.7  0.6  0.4   Alkaline Phos 38 -  126 U/L 64  61  72   AST 15 - 41 U/L '29  29  27   ' ALT 0 - 44 U/L '23  22  20     ' Lab Results  Component Value Date   MPROTEIN 0.1 (H) 04/07/2022   MPROTEIN 0.1 (H) 03/31/2022   MPROTEIN 0.7 (H) 03/03/2022   Lab Results  Component Value Date   KPAFRELGTCHN 10.6 05/05/2022   KPAFRELGTCHN 14.5 04/07/2022   KPAFRELGTCHN 16.2 03/31/2022   LAMBDASER 6.1 05/05/2022   LAMBDASER 6.6 04/07/2022   LAMBDASER 8.4 03/31/2022   KAPLAMBRATIO 1.74 (H) 05/05/2022   KAPLAMBRATIO 2.20 (H) 04/07/2022   KAPLAMBRATIO 1.93 (H) 03/31/2022    RADIOGRAPHIC STUDIES: No results found.  ASSESSMENT & PLAN Jarquez Mestre.  76 y.o. male with medical history significant for IgA lambda multiple myeloma who presents for a follow up visit.   Previously we discussed the disease process of multiple myeloma.  We discussed the incurable nature of this disease and the nature of the treatment.  We discussed that this is a cancer of the bone marrow with plasma cells producing an abundance of M protein which in this patient's case causes his severe renal dysfunction.  Additionally we noted that myeloma can cause lytic lesions of the bone, anemia, and increased levels of calcium.  We discussed the treatment moving forward including doublet and triplet therapy.  Given the patient's good health overall, but poor kidney function I would recommend proceeding with CyBorD chemotherapy.  Mr. Barren voices understanding of the treatment regimen and the plan moving forward.  At this time we will plan to proceed with Velcade 1.5 mg per metered squared subq weekly, cyclophosphamide 300 mg per metered squared IV weekly, and dexamethasone 20 mg p.o. weekly.  Cycles will consist of 4 weekly treatments and will continue for a minimum of 6 cycles before consideration of maintenance therapy.  Additionally if kidney function improves can consider transitioning regimen to VRD.    # IgA lambda Multiple Myeloma -- Bone marrow biopsy performed on 01/16/2022 showed increased plasma cells consistent with a plasma cell neoplasm.  Normal FISH panel. --Patient meets diagnostic criteria based on kidney dysfunction and anemia. --Cycle 1 Day 1 of CyBorD chemotherapy started on 02/07/2022 Plan: --Most recent SPEP from 04/07/2022 showed M protein 0.1 g/dL, kappa 14.5, lambda 6.6, ratio 2.20. This is a robust response to therapy.  --today is Cycle 3 Day 15 of CyBorD --Labs today show white blood cell 2.5, hemoglobin 9.4, MCV 100.7, and platelets of 95. OK to proceed with treatment.  --Cr 1.67, improving --RTC in 2 weeks with interval weekly treatments   #Supportive  Care -- chemotherapy education complete -- port placement not required.  -- zofran 16m q8H PRN and compazine 142mPO q6H for nausea -- acyclovir 4004mO BID for VCZ prophylaxis -- allopurinol 300m2m daily for TLS prophylaxis --zometa to start after dental clearance.  -- no pain medication required at this time.   No orders of the defined types were placed in this encounter.  All questions were answered. The patient knows to call the clinic with any problems, questions or concerns.  I have spent a total of 30 minutes minutes of face-to-face and non-face-to-face time, preparing to see the patient, performing a medically appropriate examination, counseling and educating the patient, documenting clinical information in the electronic health record,  and care coordination.   JohnLedell Peoples Department of Hematology/Oncology [facility]:  [phone] Pager: [phone] Email: [email or web address]  05/06/2022 4:21 PM

## 2022-05-09 ENCOUNTER — Other Ambulatory Visit: Payer: Self-pay

## 2022-05-09 LAB — MULTIPLE MYELOMA PANEL, SERUM
Albumin SerPl Elph-Mcnc: 3.4 g/dL (ref 2.9–4.4)
Albumin/Glob SerPl: 1.6 (ref 0.7–1.7)
Alpha 1: 0.3 g/dL (ref 0.0–0.4)
Alpha2 Glob SerPl Elph-Mcnc: 0.6 g/dL (ref 0.4–1.0)
B-Globulin SerPl Elph-Mcnc: 0.9 g/dL (ref 0.7–1.3)
Gamma Glob SerPl Elph-Mcnc: 0.5 g/dL (ref 0.4–1.8)
Globulin, Total: 2.2 g/dL (ref 2.2–3.9)
IgA: 49 mg/dL — ABNORMAL LOW (ref 61–437)
IgG (Immunoglobin G), Serum: 509 mg/dL — ABNORMAL LOW (ref 603–1613)
IgM (Immunoglobulin M), Srm: 21 mg/dL (ref 15–143)
Total Protein ELP: 5.6 g/dL — ABNORMAL LOW (ref 6.0–8.5)

## 2022-05-11 ENCOUNTER — Other Ambulatory Visit: Payer: Self-pay | Admitting: Hematology and Oncology

## 2022-05-13 ENCOUNTER — Inpatient Hospital Stay: Payer: No Typology Code available for payment source | Attending: Hematology and Oncology

## 2022-05-13 ENCOUNTER — Telehealth: Payer: Self-pay

## 2022-05-13 ENCOUNTER — Inpatient Hospital Stay: Payer: No Typology Code available for payment source

## 2022-05-13 ENCOUNTER — Other Ambulatory Visit: Payer: Self-pay

## 2022-05-13 VITALS — BP 150/73 | HR 57 | Temp 98.2°F | Resp 17 | Wt 221.2 lb

## 2022-05-13 DIAGNOSIS — Z881 Allergy status to other antibiotic agents status: Secondary | ICD-10-CM | POA: Diagnosis not present

## 2022-05-13 DIAGNOSIS — Z7901 Long term (current) use of anticoagulants: Secondary | ICD-10-CM | POA: Insufficient documentation

## 2022-05-13 DIAGNOSIS — Z5112 Encounter for antineoplastic immunotherapy: Secondary | ICD-10-CM | POA: Insufficient documentation

## 2022-05-13 DIAGNOSIS — M79606 Pain in leg, unspecified: Secondary | ICD-10-CM | POA: Insufficient documentation

## 2022-05-13 DIAGNOSIS — Z79624 Long term (current) use of inhibitors of nucleotide synthesis: Secondary | ICD-10-CM | POA: Diagnosis not present

## 2022-05-13 DIAGNOSIS — C9000 Multiple myeloma not having achieved remission: Secondary | ICD-10-CM

## 2022-05-13 DIAGNOSIS — Z7952 Long term (current) use of systemic steroids: Secondary | ICD-10-CM | POA: Diagnosis not present

## 2022-05-13 DIAGNOSIS — Z79899 Other long term (current) drug therapy: Secondary | ICD-10-CM | POA: Diagnosis not present

## 2022-05-13 DIAGNOSIS — Z79890 Hormone replacement therapy (postmenopausal): Secondary | ICD-10-CM | POA: Diagnosis not present

## 2022-05-13 DIAGNOSIS — R61 Generalized hyperhidrosis: Secondary | ICD-10-CM | POA: Diagnosis not present

## 2022-05-13 DIAGNOSIS — Z8719 Personal history of other diseases of the digestive system: Secondary | ICD-10-CM | POA: Insufficient documentation

## 2022-05-13 DIAGNOSIS — R200 Anesthesia of skin: Secondary | ICD-10-CM | POA: Diagnosis not present

## 2022-05-13 DIAGNOSIS — Z800 Family history of malignant neoplasm of digestive organs: Secondary | ICD-10-CM | POA: Insufficient documentation

## 2022-05-13 DIAGNOSIS — D649 Anemia, unspecified: Secondary | ICD-10-CM | POA: Diagnosis not present

## 2022-05-13 DIAGNOSIS — Z87891 Personal history of nicotine dependence: Secondary | ICD-10-CM | POA: Diagnosis not present

## 2022-05-13 DIAGNOSIS — Z5111 Encounter for antineoplastic chemotherapy: Secondary | ICD-10-CM | POA: Insufficient documentation

## 2022-05-13 LAB — CMP (CANCER CENTER ONLY)
ALT: 19 U/L (ref 0–44)
AST: 24 U/L (ref 15–41)
Albumin: 4.1 g/dL (ref 3.5–5.0)
Alkaline Phosphatase: 67 U/L (ref 38–126)
Anion gap: 6 (ref 5–15)
BUN: 12 mg/dL (ref 8–23)
CO2: 25 mmol/L (ref 22–32)
Calcium: 8.9 mg/dL (ref 8.9–10.3)
Chloride: 106 mmol/L (ref 98–111)
Creatinine: 1.73 mg/dL — ABNORMAL HIGH (ref 0.61–1.24)
GFR, Estimated: 40 mL/min — ABNORMAL LOW (ref >60)
Glucose, Bld: 138 mg/dL — ABNORMAL HIGH (ref 70–99)
Potassium: 4.5 mmol/L (ref 3.5–5.1)
Sodium: 137 mmol/L (ref 135–145)
Total Bilirubin: 0.7 mg/dL (ref 0.3–1.2)
Total Protein: 6 g/dL — ABNORMAL LOW (ref 6.5–8.1)

## 2022-05-13 LAB — CBC WITH DIFFERENTIAL (CANCER CENTER ONLY)
Abs Immature Granulocytes: 0.01 10*3/uL (ref 0.00–0.07)
Basophils Absolute: 0 10*3/uL (ref 0.0–0.1)
Basophils Relative: 0 %
Eosinophils Absolute: 0.1 10*3/uL (ref 0.0–0.5)
Eosinophils Relative: 3 %
HCT: 30 % — ABNORMAL LOW (ref 39.0–52.0)
Hemoglobin: 9.8 g/dL — ABNORMAL LOW (ref 13.0–17.0)
Immature Granulocytes: 0 %
Lymphocytes Relative: 40 %
Lymphs Abs: 1 10*3/uL (ref 0.7–4.0)
MCH: 33 pg (ref 26.0–34.0)
MCHC: 32.7 g/dL (ref 30.0–36.0)
MCV: 101 fL — ABNORMAL HIGH (ref 80.0–100.0)
Monocytes Absolute: 0.5 10*3/uL (ref 0.1–1.0)
Monocytes Relative: 21 %
Neutro Abs: 0.9 10*3/uL — ABNORMAL LOW (ref 1.7–7.7)
Neutrophils Relative %: 36 %
Platelet Count: 148 10*3/uL — ABNORMAL LOW (ref 150–400)
RBC: 2.97 MIL/uL — ABNORMAL LOW (ref 4.22–5.81)
RDW: 18.6 % — ABNORMAL HIGH (ref 11.5–15.5)
WBC Count: 2.6 10*3/uL — ABNORMAL LOW (ref 4.0–10.5)
nRBC: 0 % (ref 0.0–0.2)

## 2022-05-13 LAB — LACTATE DEHYDROGENASE: LDH: 277 U/L — ABNORMAL HIGH (ref 98–192)

## 2022-05-13 MED ORDER — SODIUM CHLORIDE 0.9 % IV SOLN
300.0000 mg/m2 | Freq: Once | INTRAVENOUS | Status: AC
  Administered 2022-05-13: 700 mg via INTRAVENOUS
  Filled 2022-05-13: qty 35

## 2022-05-13 MED ORDER — SODIUM CHLORIDE 0.9 % IV SOLN
40.0000 mg | Freq: Once | INTRAVENOUS | Status: AC
  Administered 2022-05-13: 40 mg via INTRAVENOUS
  Filled 2022-05-13: qty 4

## 2022-05-13 MED ORDER — PALONOSETRON HCL INJECTION 0.25 MG/5ML
0.2500 mg | Freq: Once | INTRAVENOUS | Status: AC
  Administered 2022-05-13: 0.25 mg via INTRAVENOUS
  Filled 2022-05-13: qty 5

## 2022-05-13 MED ORDER — BORTEZOMIB CHEMO SQ INJECTION 3.5 MG (2.5MG/ML)
1.5000 mg/m2 | Freq: Once | INTRAMUSCULAR | Status: AC
  Administered 2022-05-13: 3.5 mg via SUBCUTANEOUS
  Filled 2022-05-13: qty 1.4

## 2022-05-13 MED ORDER — SODIUM CHLORIDE 0.9 % IV SOLN: Freq: Once | INTRAVENOUS | Status: AC

## 2022-05-13 NOTE — Telephone Encounter (Signed)
Per Dr. [name], okay to proceed with tx with ANC of 0.9 and Scr of 1.73

## 2022-05-13 NOTE — Patient Instructions (Signed)
[facility]  Discharge Instructions: Thank you for choosing [facility] to provide your oncology and hematology care.   If you have a lab appointment with the [facility], please go directly to the [facility] and check in at the registration area.   Wear comfortable clothing and clothing appropriate for easy access to any Portacath or PICC line.   We strive to give you quality time with your provider. You may need to reschedule your appointment if you arrive late (15 or more minutes).  Arriving late affects you and other patients whose appointments are after yours.  Also, if you miss three or more appointments without notifying the office, you may be dismissed from the clinic at the provider's discretion.      For prescription refill requests, have your pharmacy contact our office and allow 72 hours for refills to be completed.    Today you received the following chemotherapy and/or immunotherapy agents: velcade, cyclophosphamide      To help prevent nausea and vomiting after your treatment, we encourage you to take your nausea medication as directed.  BELOW ARE SYMPTOMS THAT SHOULD BE REPORTED IMMEDIATELY: *FEVER GREATER THAN 100.4 F (38 C) OR HIGHER *CHILLS OR SWEATING *NAUSEA AND VOMITING THAT IS NOT CONTROLLED WITH YOUR NAUSEA MEDICATION *UNUSUAL SHORTNESS OF BREATH *UNUSUAL BRUISING OR BLEEDING *URINARY PROBLEMS (pain or burning when urinating, or frequent urination) *BOWEL PROBLEMS (unusual diarrhea, constipation, pain near the anus) TENDERNESS IN MOUTH AND THROAT WITH OR WITHOUT PRESENCE OF ULCERS (sore throat, sores in mouth, or a toothache) UNUSUAL RASH, SWELLING OR PAIN  UNUSUAL VAGINAL DISCHARGE OR ITCHING   Items with * indicate a potential emergency and should be followed up as soon as possible or go to the Emergency Department if any problems should occur.  Please show the CHEMOTHERAPY ALERT CARD or IMMUNOTHERAPY ALERT CARD  at check-in to the Emergency Department and triage nurse.  Should you have questions after your visit or need to cancel or reschedule your appointment, please contact [facility]  Dept: [phone]  and follow the prompts.  Office hours are 8:00 a.m. to 4:30 p.m. Monday - Friday. Please note that voicemails left after 4:00 p.m. may not be returned until the following business day.  We are closed weekends and major holidays. You have access to a nurse at all times for urgent questions. Please call the main number to the clinic Dept: [phone] and follow the prompts.   For any non-urgent questions, you may also contact your provider using MyChart. We now offer e-Visits for anyone 82 and older to request care online for non-urgent symptoms. For details visit [email or web address].   Also download the MyChart app! Go to the app store, search "MyChart", open the app, select [facility], and log in with your MyChart username and password.  Masks are optional in the cancer centers. If you would like for your care team to wear a mask while they are taking care of you, please let them know. You may have one support person who is at least 77 years old accompany you for your appointments.

## 2022-05-16 ENCOUNTER — Telehealth: Payer: Self-pay | Admitting: *Deleted

## 2022-05-16 NOTE — Telephone Encounter (Signed)
Received vm message from pt stating that he is having increased pain in his back and legs. This new for him. He is asking for pain meds.  Please advise

## 2022-05-19 ENCOUNTER — Inpatient Hospital Stay (HOSPITAL_BASED_OUTPATIENT_CLINIC_OR_DEPARTMENT_OTHER): Payer: No Typology Code available for payment source | Admitting: Hematology and Oncology

## 2022-05-19 ENCOUNTER — Inpatient Hospital Stay: Payer: No Typology Code available for payment source

## 2022-05-19 ENCOUNTER — Other Ambulatory Visit: Payer: Self-pay

## 2022-05-19 DIAGNOSIS — Z5112 Encounter for antineoplastic immunotherapy: Secondary | ICD-10-CM | POA: Diagnosis not present

## 2022-05-19 DIAGNOSIS — C9000 Multiple myeloma not having achieved remission: Secondary | ICD-10-CM

## 2022-05-19 LAB — CMP (CANCER CENTER ONLY)
ALT: 17 U/L (ref 0–44)
AST: 24 U/L (ref 15–41)
Albumin: 4 g/dL (ref 3.5–5.0)
Alkaline Phosphatase: 58 U/L (ref 38–126)
Anion gap: 5 (ref 5–15)
BUN: 12 mg/dL (ref 8–23)
CO2: 26 mmol/L (ref 22–32)
Calcium: 8.7 mg/dL — ABNORMAL LOW (ref 8.9–10.3)
Chloride: 109 mmol/L (ref 98–111)
Creatinine: 1.92 mg/dL — ABNORMAL HIGH (ref 0.61–1.24)
GFR, Estimated: 36 mL/min — ABNORMAL LOW (ref >60)
Glucose, Bld: 128 mg/dL — ABNORMAL HIGH (ref 70–99)
Potassium: 4.1 mmol/L (ref 3.5–5.1)
Sodium: 140 mmol/L (ref 135–145)
Total Bilirubin: 0.8 mg/dL (ref 0.3–1.2)
Total Protein: 6.1 g/dL — ABNORMAL LOW (ref 6.5–8.1)

## 2022-05-19 LAB — CBC WITH DIFFERENTIAL (CANCER CENTER ONLY)
Abs Immature Granulocytes: 0.01 10*3/uL (ref 0.00–0.07)
Basophils Absolute: 0 10*3/uL (ref 0.0–0.1)
Basophils Relative: 1 %
Eosinophils Absolute: 0.1 10*3/uL (ref 0.0–0.5)
Eosinophils Relative: 4 %
HCT: 30.1 % — ABNORMAL LOW (ref 39.0–52.0)
Hemoglobin: 9.7 g/dL — ABNORMAL LOW (ref 13.0–17.0)
Immature Granulocytes: 1 %
Lymphocytes Relative: 35 %
Lymphs Abs: 0.8 10*3/uL (ref 0.7–4.0)
MCH: 33 pg (ref 26.0–34.0)
MCHC: 32.2 g/dL (ref 30.0–36.0)
MCV: 102.4 fL — ABNORMAL HIGH (ref 80.0–100.0)
Monocytes Absolute: 0.5 10*3/uL (ref 0.1–1.0)
Monocytes Relative: 25 %
Neutro Abs: 0.7 10*3/uL — ABNORMAL LOW (ref 1.7–7.7)
Neutrophils Relative %: 34 %
Platelet Count: 140 10*3/uL — ABNORMAL LOW (ref 150–400)
RBC: 2.94 MIL/uL — ABNORMAL LOW (ref 4.22–5.81)
RDW: 18 % — ABNORMAL HIGH (ref 11.5–15.5)
WBC Count: 2.1 10*3/uL — ABNORMAL LOW (ref 4.0–10.5)
nRBC: 1.4 % — ABNORMAL HIGH (ref 0.0–0.2)

## 2022-05-19 LAB — LACTATE DEHYDROGENASE: LDH: 260 U/L — ABNORMAL HIGH (ref 98–192)

## 2022-05-19 MED ORDER — TRAMADOL HCL 50 MG PO TABS: 50.0000 mg | ORAL_TABLET | Freq: Four times a day (QID) | ORAL | 0 refills | Status: DC | PRN

## 2022-05-19 MED ORDER — GABAPENTIN 300 MG PO CAPS: 300.0000 mg | ORAL_CAPSULE | Freq: Two times a day (BID) | ORAL | 1 refills | Status: DC

## 2022-05-19 NOTE — Progress Notes (Signed)
[place] [phone]   [phone]  PROGRESS NOTE  Patient Care Team: [name], MD as PCP - General (Internal Medicine)  Hematological/Oncological History # IgA Lambda Multiple Myeloma 05/03/2015: last visit with Dr. Julien Nordmann at the [facility]. Was followed for IgA lambda MGUS. 12/11/2021: labs show M protein 3.4, Kappa 19.2, Lambda 1576.4, ratio 0.01. Cr 3.47, Hgb 8.6, WBC 5.7, MCV 99, Plt 221 12/23/2021: establish care with Dr. [name]  01/16/2022: Bone marrow biopsy performed, showed a 60% cellular bone marrow predominantly comprised of plasma cells making up 70 to 80%, lambda restricted.  Myeloma FISH panel showed no evidence of abnormalities. 02/07/2022: Cycle 1 Day 1 of CyBorD chemotherapy.  03/09/2022-03/17/2022: hospitalized for fever/pneumonia.  03/24/2022: Cycle 2 Day 1 of CyBorD chemotherapy.  04/22/2022: Cycle 3 Day 1 of CyBorD chemotherapy 05/19/2022: Delayed start of Cycle 4 Day 1 of CyBorD chemotherapy due to neutropenia.   Interval History:  Darren Hinton. 76 y.o. male with medical history significant for IgA lambda multiple myeloma who presents for a follow up visit. The patient's last visit was on 05/05/2022. In the interim since the last visit he has had no major changes in his health.   On exam today Darren Hinton reports he has been having some trouble with lower extremity pain which started last week.  He reports it goes down both his legs and describes as a "achy, toothache like pain".  This pain was unfortunately so severe prevented him from going to church on Sunday.  He reports that he has otherwise tolerating chemotherapy quite well.  He is not having any major side effects of the was having somewhat looser stools on Tuesdays.  His appetite is good.  Unfortunately his appetite is also stunted by the fact that he needs [facility] and [facility].  Patient denies any fevers, chills,  night sweats, shortness of breath, chest pain, cough, neuropathy, peripheral edema, headaches or dizziness.  He has no other complaints.  A full 10 point ROS is otherwise negative.  MEDICAL HISTORY:  Past Medical History:  Diagnosis Date   Allergy    Anxiety    Arthritis    Depression    Heart murmur    Hyperlipidemia    Hyperplastic colon polyp    Hyperproteinemia    Hypertension    Hypothyroidism    Mitral valve prolapse    Paroxysmal atrial fibrillation (HCC)    Thyroid disease     SURGICAL HISTORY: Past Surgical History:  Procedure Laterality Date   CARDIOVERSION N/A 03/14/2022   Procedure: CARDIOVERSION;  Surgeon: Pixie Casino, MD;  Location: [place];  Service: Cardiovascular;  Laterality: N/A;   TEE WITHOUT CARDIOVERSION N/A 03/14/2022   Procedure: TRANSESOPHAGEAL ECHOCARDIOGRAM (TEE);  Surgeon: Pixie Casino, MD;  Location[facility] ENDOSCOPY;  Service: Cardiovascular;  Laterality: N/A;    SOCIAL HISTORY: Social History   Socioeconomic History   Marital status: Married    Spouse name: Not on file   Number of children: Not on file   Years of education: Not on file   Highest education level: Not on file  Occupational History   Not on file  Tobacco Use   Smoking status: Former    Years: 20.00    Types: Cigarettes    Quit date: 10/01/1978    Years since quitting: 43.6   Smokeless tobacco: Never   Tobacco comments:    Former smoker 04/03/22  Substance  and Sexual Activity   Alcohol use: Yes    Alcohol/week: 2.0 standard drinks of alcohol    Types: 2 Glasses of wine per week    Comment: occasionally   Drug use: No   Sexual activity: Not on file  Other Topics Concern   Not on file  Social History Narrative   Not on file   Social Determinants of Health   Financial Resource Strain: Not on file  Food Insecurity: Not on file  Transportation Needs: Not on file  Physical Activity: Not on file  Stress: Not on file  Social Connections: Not on file   Intimate Partner Violence: Not on file    FAMILY HISTORY: Family History  Problem Relation Age of Onset   Cancer Father    Colon cancer Neg Hx    Esophageal cancer Neg Hx    Liver cancer Neg Hx    Pancreatic cancer Neg Hx    Rectal cancer Neg Hx    Stomach cancer Neg Hx     ALLERGIES:  is allergic to zithromax [azithromycin].  MEDICATIONS:  Current Outpatient Medications  Medication Sig Dispense Refill   gabapentin (NEURONTIN) 300 MG capsule Take 1 capsule (300 mg total) by mouth 2 (two) times daily. 60 capsule 1   acyclovir (ZOVIRAX) 400 MG tablet Take 1 tablet (400 mg total) by mouth 2 (two) times daily. 180 tablet 1   allopurinol (ZYLOPRIM) 300 MG tablet Take 1 tablet (300 mg total) by mouth daily. 90 tablet 1   ALPRAZolam (XANAX) 0.25 MG tablet Take 0.25 mg by mouth daily as needed. Anxiety      amiodarone (PACERONE) 200 MG tablet Take 1 tablet (200 mg total) by mouth daily. 30 tablet 3   amLODipine (NORVASC) 5 MG tablet Take 5 mg by mouth 2 (two) times daily.     apixaban (ELIQUIS) 5 MG TABS tablet Take 1 tablet (5 mg total) by mouth 2 (two) times daily. 180 tablet 1   atorvastatin (LIPITOR) 20 MG tablet Take 20 mg by mouth daily.     dexamethasone (DECADRON) 4 MG tablet Take 10 tablets (40 mg total) by mouth once a week. Take steroids pills the in the morning on chemotherapy days. 120 tablet 1   ergocalciferol (VITAMIN D2) 1.25 MG (50000 UT) capsule Take 50,000 Units by mouth every Wednesday.     ferrous gluconate (FERGON) 324 MG tablet Take 1 tablet (324 mg total) by mouth 3 (three) times daily with meals. 90 tablet 0   fluticasone (FLONASE) 50 MCG/ACT nasal spray Place 2 sprays into the nose daily as needed. Allergies      levothyroxine (SYNTHROID) 100 MCG tablet Take 100 mcg by mouth daily before breakfast.     loratadine (CLARITIN) 10 MG tablet Take 10 mg by mouth daily.     meclizine (ANTIVERT) 25 MG tablet Take 1 tablet (25 mg total) by mouth 3 (three) times daily as  needed for dizziness. 30 tablet 0   metoprolol succinate (TOPROL-XL) 25 MG 24 hr tablet Take 1 tablet (25 mg total) by mouth daily. Take with or immediately following a meal. 30 tablet 3   ondansetron (ZOFRAN) 8 MG tablet Take 1 tablet (8 mg total) by mouth every 8 (eight) hours as needed. 30 tablet 0   prochlorperazine (COMPAZINE) 10 MG tablet Take 1 tablet (10 mg total) by mouth every 6 (six) hours as needed for nausea or vomiting. 30 tablet 0   senna-docusate (SENOKOT-S) 8.6-50 MG tablet Take 1 tablet by mouth at  bedtime as needed for mild constipation.     traMADol (ULTRAM) 50 MG tablet Take 1 tablet (50 mg total) by mouth every 6 (six) hours as needed. 30 tablet 0   vitamin B-12 (CYANOCOBALAMIN) 500 MCG tablet Take 1 tablet by mouth daily.     Current Facility-Administered Medications  Medication Dose Route Frequency Provider Last Rate Last Admin   0.9 %  sodium chloride infusion  500 mL Intravenous Once Armbruster, Carlota Raspberry, MD        REVIEW OF SYSTEMS:   Constitutional: ( - ) fevers, ( - )  chills , ( - ) night sweats Eyes: ( - ) blurriness of vision, ( - ) double vision, ( - ) watery eyes Ears, nose, mouth, throat, and face: ( - ) mucositis, ( - ) sore throat Respiratory: ( - ) cough, ( - ) dyspnea, ( - ) wheezes Cardiovascular: ( - ) palpitation, ( - ) chest discomfort, ( - ) lower extremity swelling Gastrointestinal:  ( - ) nausea, ( - ) heartburn, ( - ) change in bowel habits Skin: ( - ) abnormal skin rashes Lymphatics: ( - ) new lymphadenopathy, ( - ) easy bruising Neurological: ( - ) numbness, ( - ) tingling, ( - ) new weaknesses Behavioral/Psych: ( - ) mood change, ( - ) new changes  All other systems were reviewed with the patient and are negative.  PHYSICAL EXAMINATION: ECOG PERFORMANCE STATUS: 1 - Symptomatic but completely ambulatory  Vitals:   05/19/22 0950  BP: (!) 182/88  Pulse: (!) 56  Resp: 16  Temp: 98.3 F (36.8 C)  SpO2: 97%     Filed Weights    05/19/22 0950  Weight: 219 lb 1.6 oz (99.4 kg)   GENERAL: Chronically ill-appearing elderly African-American male, alert, no distress and comfortable SKIN: skin color, texture, turgor are normal, no rashes or significant lesions EYES: conjunctiva are pink and non-injected, sclera clear LUNGS: clear to auscultation and percussion with normal breathing effort HEART: regular rate & rhythm and no murmurs and no lower extremity edema Musculoskeletal: no cyanosis of digits and no clubbing  PSYCH: alert & oriented x 3, fluent speech NEURO: no focal motor/sensory deficits  LABORATORY DATA:  I have reviewed the data as listed    Latest Ref Rng & Units 05/19/2022    9:29 AM 05/13/2022    8:19 AM 05/05/2022   12:36 PM  CBC  WBC 4.0 - 10.5 K/uL 2.1  2.6  2.5   Hemoglobin 13.0 - 17.0 g/dL 9.7  9.8  9.4   Hematocrit 39.0 - 52.0 % 30.1  30.0  28.8   Platelets 150 - 400 K/uL 140  148  95        Latest Ref Rng & Units 05/19/2022    9:29 AM 05/13/2022    8:19 AM 05/05/2022   12:36 PM  CMP  Glucose 70 - 99 mg/dL 128  138  103   BUN 8 - 23 mg/dL '12  12  11   ' Creatinine 0.61 - 1.24 mg/dL 1.92  1.73  1.67   Sodium 135 - 145 mmol/L 140  137  138   Potassium 3.5 - 5.1 mmol/L 4.1  4.5  4.5   Chloride 98 - 111 mmol/L 109  106  108   CO2 22 - 32 mmol/L '26  25  27   ' Calcium 8.9 - 10.3 mg/dL 8.7  8.9  8.9   Total Protein 6.5 - 8.1 g/dL 6.1  6.0  5.8  Total Bilirubin 0.3 - 1.2 mg/dL 0.8  0.7  0.7   Alkaline Phos 38 - 126 U/L 58  67  64   AST 15 - 41 U/L '24  24  29   ' ALT 0 - 44 U/L '17  19  23     ' Lab Results  Component Value Date   MPROTEIN Not Observed 05/05/2022   MPROTEIN 0.1 (H) 04/07/2022   MPROTEIN 0.1 (H) 03/31/2022   Lab Results  Component Value Date   KPAFRELGTCHN 10.6 05/05/2022   KPAFRELGTCHN 14.5 04/07/2022   KPAFRELGTCHN 16.2 03/31/2022   LAMBDASER 6.1 05/05/2022   LAMBDASER 6.6 04/07/2022   LAMBDASER 8.4 03/31/2022   KAPLAMBRATIO 1.74 (H) 05/05/2022   KAPLAMBRATIO 2.20 (H)  04/07/2022   KAPLAMBRATIO 1.93 (H) 03/31/2022    RADIOGRAPHIC STUDIES: No results found.  ASSESSMENT & PLAN Mekhi Sonn. 76 y.o. male with medical history significant for IgA lambda multiple myeloma who presents for a follow up visit.   Previously we discussed the disease process of multiple myeloma.  We discussed the incurable nature of this disease and the nature of the treatment.  We discussed that this is a cancer of the bone marrow with plasma cells producing an abundance of M protein which in this patient's case causes his severe renal dysfunction.  Additionally we noted that myeloma can cause lytic lesions of the bone, anemia, and increased levels of calcium.  We discussed the treatment moving forward including doublet and triplet therapy.  Given the patient's good health overall, but poor kidney function I would recommend proceeding with CyBorD chemotherapy.  Darren Hinton voices understanding of the treatment regimen and the plan moving forward.  At this time we will plan to proceed with Velcade 1.5 mg per metered squared subq weekly, cyclophosphamide 300 mg per metered squared IV weekly, and dexamethasone 20 mg p.o. weekly.  Cycles will consist of 4 weekly treatments and will continue for a minimum of 6 cycles before consideration of maintenance therapy.  Additionally if kidney function improves can consider transitioning regimen to VRD.    # IgA lambda Multiple Myeloma -- Bone marrow biopsy performed on 01/16/2022 showed increased plasma cells consistent with a plasma cell neoplasm.  Normal FISH panel. --Patient meets diagnostic criteria based on kidney dysfunction and anemia. --Cycle 1 Day 1 of CyBorD chemotherapy started on 02/07/2022 Plan: --Most recent SPEP from 04/07/2022 showed M protein 0.1 g/dL, kappa 14.5, lambda 6.6, ratio 2.20. This is a robust response to therapy.  --today is Cycle 4 Day 1 of CyBorD --Labs today show white blood cell 2.1, hemoglobin 9.7, MCV 102.4, and  platelets of 140. ANC 0.7. Will HOLD treatment today due to neutropenia.  --Cr 1.92, improving --RTC in 2 weeks with interval weekly treatments   # Leg Pain -- Likely a component of worsening neuropathy from his chemotherapy with pre-existing pain. -- We will increase gabapentin to 300 mg twice daily -- We will add tramadol 50 mg every 6 hours as needed  #Supportive Care -- chemotherapy education complete -- port placement not required.  -- zofran 3m q8H PRN and compazine 128mPO q6H for nausea -- acyclovir 40046mO BID for VCZ prophylaxis -- allopurinol 300m77m daily for TLS prophylaxis --zometa to start after dental clearance.   No orders of the defined types were placed in this encounter.  All questions were answered. The patient knows to call the clinic with any problems, questions or concerns.  I have spent a total of 30 minutes  minutes of face-to-face and non-face-to-face time, preparing to see the patient, performing a medically appropriate examination, counseling and educating the patient, documenting clinical information in the electronic health record,  and care coordination.   Ledell Peoples, MD Department of Hematology/Oncology [place] [facility] Phone: [phone] Pager: [phone] Email: [email or web address]  05/19/2022 2:50 PM

## 2022-05-20 ENCOUNTER — Encounter: Payer: Self-pay | Admitting: Cardiovascular Disease

## 2022-05-20 ENCOUNTER — Ambulatory Visit: Payer: No Typology Code available for payment source | Attending: General Practice | Admitting: Cardiovascular Disease

## 2022-05-20 VITALS — BP 139/79 | HR 63 | Ht 77.0 in | Wt 221.8 lb

## 2022-05-20 DIAGNOSIS — N1832 Chronic kidney disease, stage 3b: Secondary | ICD-10-CM

## 2022-05-20 DIAGNOSIS — I4819 Other persistent atrial fibrillation: Secondary | ICD-10-CM | POA: Diagnosis not present

## 2022-05-20 DIAGNOSIS — Z5181 Encounter for therapeutic drug level monitoring: Secondary | ICD-10-CM

## 2022-05-20 DIAGNOSIS — E039 Hypothyroidism, unspecified: Secondary | ICD-10-CM

## 2022-05-20 DIAGNOSIS — D6869 Other thrombophilia: Secondary | ICD-10-CM

## 2022-05-20 DIAGNOSIS — C9000 Multiple myeloma not having achieved remission: Secondary | ICD-10-CM | POA: Diagnosis not present

## 2022-05-20 DIAGNOSIS — I10 Essential (primary) hypertension: Secondary | ICD-10-CM

## 2022-05-20 DIAGNOSIS — Z79899 Other long term (current) drug therapy: Secondary | ICD-10-CM

## 2022-05-20 LAB — KAPPA/LAMBDA LIGHT CHAINS
Kappa free light chain: 10.9 mg/L (ref 3.3–19.4)
Kappa, lambda light chain ratio: 1.65 (ref 0.26–1.65)
Lambda free light chains: 6.6 mg/L (ref 5.7–26.3)

## 2022-05-20 NOTE — Progress Notes (Signed)
Cardiology Office Note:    Date:  05/20/2022   ID:  Darren Sermon., DOB 13-Apr-1946, MRN [id number]  PCP:  [name], MD   [facility] Providers Cardiologist:  None     Referring MD: [name], MD   Chief Complaint  Patient presents with   Atrial Fibrillation    History of Present Illness:    Darren Haste. is a 76 y.o. male with a hx of recently diagnosed persistent atrial fibrillation, with early recurrence following cardioversion in July 2023, multiple myeloma (IgA lambda, Darren Hinton, currently onCyBorD chemotherapy), hypertension returning in follow-up.  He has been on amiodarone and Eliquis since discharge from the hospital in July.  He is oblivious to palpitations.  He has not had any falls, injuries or bleeding.  He denies problems with orthopnea, PND or lower extremity edema and has not had problems with chest pain.  Not particularly active, but has not had problems with shortness of breath with activities of daily living.  Does have some neuropathic pain in the lower extremities.  Has not had gout.  Chemotherapy was withheld yesterday due to neutropenia (2.1k).  Current treatment plan is with Velcade and cyclophosphamide and dexamethasone weekly  He is an [employer].  Past Medical History:  Diagnosis Date   Allergy    Anxiety    Arthritis    Depression    Heart murmur    Hyperlipidemia    Hyperplastic colon polyp    Hyperproteinemia    Hypertension    Hypothyroidism    Mitral valve prolapse    Paroxysmal atrial fibrillation (HCC)    Thyroid disease     Past Surgical History:  Procedure Laterality Date   CARDIOVERSION N/A 03/14/2022   Procedure: CARDIOVERSION;  Surgeon: Pixie Casino, MD;  Location: [facility];  Service: Cardiovascular;  Laterality: N/A;   TEE WITHOUT CARDIOVERSION N/A 03/14/2022   Procedure: TRANSESOPHAGEAL ECHOCARDIOGRAM (TEE);  Surgeon: Pixie Casino, MD;  Location: [facility]COPY;  Service:  Cardiovascular;  Laterality: N/A;    Current Medications: Current Meds  Medication Sig   acyclovir (ZOVIRAX) 400 MG tablet Take 1 tablet (400 mg total) by mouth 2 (two) times daily.   allopurinol (ZYLOPRIM) 300 MG tablet Take 1 tablet (300 mg total) by mouth daily.   ALPRAZolam (XANAX) 0.25 MG tablet Take 0.25 mg by mouth daily as needed. Anxiety    amiodarone (PACERONE) 200 MG tablet Take 1 tablet (200 mg total) by mouth daily.   apixaban (ELIQUIS) 5 MG TABS tablet Take 1 tablet (5 mg total) by mouth 2 (two) times daily.   atorvastatin (LIPITOR) 20 MG tablet Take 20 mg by mouth daily.   dexamethasone (DECADRON) 4 MG tablet Take 10 tablets (40 mg total) by mouth once a week. Take steroids pills the in the morning on chemotherapy days.   ergocalciferol (VITAMIN D2) 1.25 MG (50000 UT) capsule Take 50,000 Units by mouth every Wednesday.   ferrous gluconate (FERGON) 324 MG tablet Take 1 tablet (324 mg total) by mouth 3 (three) times daily with meals.   gabapentin (NEURONTIN) 300 MG capsule Take 1 capsule (300 mg total) by mouth 2 (two) times daily.   levothyroxine (SYNTHROID) 100 MCG tablet Take 100 mcg by mouth daily before breakfast.   metoprolol succinate (TOPROL-XL) 25 MG 24 hr tablet Take 1 tablet (25 mg total) by mouth daily. Take with or immediately following a meal.   senna-docusate (SENOKOT-S) 8.6-50 MG tablet Take 1 tablet  by mouth at bedtime as needed for mild constipation.   vitamin B-12 (CYANOCOBALAMIN) 500 MCG tablet Take 1 tablet by mouth daily.   Current Facility-Administered Medications for the 05/20/22 encounter (Office Visit) with Brier Firebaugh, MD  Medication   0.9 %  sodium chloride infusion     Allergies:   Zithromax [azithromycin]   Social History   Socioeconomic History   Marital status: Married    Spouse name: Not on file   Number of children: Not on file   Years of education: Not on file   Highest education level: Not on file  Occupational History   Not on  file  Tobacco Use   Smoking status: Former    Years: 20.00    Types: Cigarettes    Quit date: 10/01/1978    Years since quitting: 43.6   Smokeless tobacco: Never   Tobacco comments:    Former smoker 04/03/22  Substance and Sexual Activity   Alcohol use: Yes    Alcohol/week: 2.0 standard drinks of alcohol    Types: 2 Glasses of wine per week    Comment: occasionally   Drug use: No   Sexual activity: Not on file  Other Topics Concern   Not on file  Social History Narrative   Not on file   Social Determinants of Health   Financial Resource Strain: Not on file  Food Insecurity: Not on file  Transportation Needs: Not on file  Physical Activity: Not on file  Stress: Not on file  Social Connections: Not on file     Family History: The patient's family history includes Cancer in his father. There is no history of Colon cancer, Esophageal cancer, Liver cancer, Pancreatic cancer, Rectal cancer, or Stomach cancer.  ROS:   Please see the history of present illness.     All other systems reviewed and are negative.  EKGs/Labs/Other Studies Reviewed:    The following studies were reviewed today: Notes from Darren Hinton from yesterday  TEE-cardioversion 03/14/2022   1. Left ventricular ejection fraction, by estimation, is 50 to 55%. The left ventricle has low normal function. There is mild left ventricular hypertrophy.   2. Right ventricular systolic function is normal. The right ventricular  size is normal.   3. No left atrial/left atrial appendage thrombus was detected.   4. The mitral valve is grossly normal. Mild mitral valve regurgitation.   5. The aortic valve is tricuspid. Aortic valve regurgitation is not  visualized.   6. 1. Cardioverted 4 time(s).      2. Cardioverted at 120J, 150J and 200J x 2 biphasic. Converted to sinus after the initial shock, then back in afib- converted to sinus again for a few seconds with 200J, then back in afib.    Conclusion(s)/Recommendation(s): No evidence of vegetation/infective endocarditis on this transesophageael echocardiogram. No LA/LAA thrombus identified. Unsuccessful cardioversion performed without restoration of normal sinus rhythm.   EKG:  EKG is not ordered today.  The ekg ordered 04/03/2022 demonstrates atrial flutter with variable AV block/coarse atrial fibrillation with RVR, minor IVCD (QRS 102 ms), QTc 535 ms  Recent Labs: 03/12/2022: TSH 3.629 03/14/2022: Magnesium 2.3 05/19/2022: ALT 17; BUN 12; Creatinine 1.92; Hemoglobin 9.7; Platelet Count 140; Potassium 4.1; Sodium 140  Recent Lipid Panel    Component Value Date/Time   CHOL  08/31/2008 0532    160        ATP III CLASSIFICATION:  <200     mg/dL   Desirable  200-239  mg/dL   Borderline  High  >=240    mg/dL   High   TRIG 49 08/31/2008 0532   HDL 48 08/31/2008 0532   CHOLHDL 3.3 08/31/2008 0532   VLDL 10 08/31/2008 0532   LDLCALC (H) 08/31/2008 0532    102        Total Cholesterol/HDL:CHD Risk Coronary Heart Disease Risk Table                     Men   Women  1/2 Average Risk   3.4   3.3     Risk Assessment/Calculations:    CHA2DS2-VASc Score = 3   This indicates a 3.2% annual risk of stroke. The patient's score is based upon: CHF History: 0 HTN History: 1 Diabetes History: 0 Stroke History: 0 Vascular Disease History: 0 Age Score: 2 Gender Score: 0               Physical Exam:    VS:  BP 139/79   Pulse 63   Ht _0  (1.956 m)   Wt 221 lb 12.8 oz (100.6 kg)   SpO2 96%   BMI 26.30 kg/m     Wt Readings from Last 3 Encounters:  05/20/22 221 lb 12.8 oz (100.6 kg)  05/19/22 219 lb 1.6 oz (99.4 kg)  05/13/22 221 lb 4 oz (100.4 kg)     GEN: Appears well, well nourished, well developed in no acute distress HEENT: Normal NECK: No JVD; No carotid bruits LYMPHATICS: No lymphadenopathy CARDIAC: Irregular, no murmurs, rubs, gallops RESPIRATORY:  Clear to auscultation without rales, wheezing or rhonchi   ABDOMEN: Soft, non-tender, non-distended MUSCULOSKELETAL:  No edema; No deformity  SKIN: Warm and dry NEUROLOGIC:  Alert and oriented x 3 PSYCHIATRIC:  Normal affect   ASSESSMENT:    1. Persistent atrial fibrillation ([place])   2. Acquired thrombophilia ([place])   3. Encounter for monitoring amiodarone therapy   4. Multiple myeloma not having achieved remission ([place])   5. Essential hypertension   6. Stage 3b chronic kidney disease ([place])   7. Acquired hypothyroidism    PLAN:    In order of problems listed above:  AFib: Persistent.  Has not been on amiodarone for couple of months and I think it is worth 1 more try cardioversion.  If this is unsuccessful, will retreat to a rate control strategy.  Continue uninterrupted Eliquis.  Rate control is currently adequate on low-dose metoprolol and amiodarone.  If we stop the amiodarone, may not be able to achieve rate control only with metoprolol (that was the situation in the hospital).  Might need to add digoxin.  Note that the left atrium was described as "normal in size" on the transthoracic and transesophageal echocardiograms, but the actual measurements suggest that it is at least mildly dilated (end-systolic diameter 4.1 cm, end-systolic volume index over 30 mL/m).   Anticoagulation: No bleeding complications Amiodarone: If he remains on this medication, need to monitor TSH and LFTs at least every 6 months.  He will be having labs frequently in the cancer center. Multiple myeloma on CyBorD chemotherapy: He had a history of atrial fibrillation 3 years ago making it less likely that the chemo is the cause for his arrhythmia and borderline reduction in LV systolic function.  Recheck echo after his cardioversion. HTN: Currently requiring a lot less antihypertensive medications in the past, maybe due to some weight loss.  Borderline elevated systolic BP today.  Can restart amlodipine if necessary. CKD 3B: Most recent creatinine from yesterday is  at  baseline at 1.7-2.0 (GFR 35-40). Acquired hypothyroidism: TSH normal in July      Shared Decision Making/Informed Consent The risks (stroke, cardiac arrhythmias rarely resulting in the need for a temporary or permanent pacemaker, skin irritation or burns and complications associated with conscious sedation including aspiration, arrhythmia, respiratory failure and death), benefits (restoration of normal sinus rhythm) and alternatives of a direct current cardioversion were explained in detail to Mr. Haithcock and he agrees to proceed.      Medication Adjustments/Labs and Tests Ordered: Current medicines are reviewed at length with the patient today.  Concerns regarding medicines are outlined above.  Orders Placed This Encounter  Procedures   Basic metabolic panel   CBC   ECHOCARDIOGRAM COMPLETE   No orders of the defined types were placed in this encounter.   Patient Instructions  Medication Instructions:  No changes *If you need a refill on your cardiac medications before your next appointment, please call your pharmacy*  Testing/Procedures: Your physician has requested that you have an echocardiogram after the cardioversion on 9/27. Echocardiography is a painless test that uses sound waves to create images of your heart. It provides your doctor with information about the size and shape of your heart and how well your heart's chambers and valves are working. You may receive an ultrasound enhancing agent through an IV if needed to better visualize your heart during the echo.This procedure takes approximately one hour. There are no restrictions for this procedure. This will take place at the [address].     Follow-Up: At [facility], you and your health needs are our priority.  As part of our continuing mission to provide you with exceptional heart care, we have created designated Provider Care Teams.  These Care Teams include your primary Cardiologist  (physician) and Advanced Practice Providers (APPs -  Physician Assistants and Nurse Practitioners) who all work together to provide you with the care you need, when you need it.  We recommend signing up for the patient portal called "MyChart".  Sign up information is provided on this After Visit Summary.  MyChart is used to connect with patients for Virtual Visits (Telemedicine).  Patients are able to view lab/test results, encounter notes, upcoming appointments, etc.  Non-urgent messages can be sent to your provider as well.   To learn more about what you can do with MyChart, go to [email or web address].    Your next appointment:   Follow up with Dr. Sallyanne Kuster after the echo and cardioversion Other Instructions  You are scheduled for a Cardioversion on 06/04/22 with Dr. Sallyanne Kuster.  Please arrive at the [facility] (Main Entrance A) at [facility]: [address] [place], [place] [address] at 6:30 am. (1 hour prior to procedure)  DIET: Nothing to eat or drink after midnight except a sip of water with medications (see medication instructions below)  FYI: For your safety, and to allow [place] to monitor your vital signs accurately during the surgery/procedure we request that   if you have artificial nails, gel coating, SNS etc. Please have those removed prior to your surgery/procedure. Not having the nail coverings /polish removed may result in cancellation or delay of your surgery/procedure.   Medication Instructions: Hold nothing to hold  Continue your anticoagulant: Eliquis You will need to continue your anticoagulant after your procedure until you  are told by your  Provider that it is safe to stop   Labs:  Your provider would like for you to  return  on 05/29/22 to have the following labs drawn: CBC and BMET. You do not need an appointment for the lab. Once in our office lobby there is a podium where you can sign in and ring the doorbell to alert [place] that you are here. The lab is open  from 8:00 am to 4 pm; closed for lunch from 12:45pm-1:45pm.  You may also go to any of these [facility] locations:   [place] ([place]) - [place] must have a responsible person to drive you home and stay in the waiting area during your procedure. Failure to do so could result in cancellation.  Bring your insurance cards.  *Special Note: Every effort is made to have your procedure done on time. Occasionally there are emergencies that occur at the hospital that may cause delays. Please be patient if a delay does occur.        Signed, Sanda Klein, MD  05/20/2022 11:37 AM    [place]

## 2022-05-20 NOTE — Patient Instructions (Signed)
Medication Instructions:  No changes *If you need a refill on your cardiac medications before your next appointment, please call your pharmacy*  Testing/Procedures: Your physician has requested that you have an echocardiogram after the cardioversion on 9/27. Echocardiography is a painless test that uses sound waves to create images of your heart. It provides your doctor with information about the size and shape of your heart and how well your heart's chambers and valves are working. You may receive an ultrasound enhancing agent through an IV if needed to better visualize your heart during the echo.This procedure takes approximately one hour. There are no restrictions for this procedure. This will take place at the [address].     Follow-Up: At [facility], you and your health needs are our priority.  As part of our continuing mission to provide you with exceptional heart care, we have created designated Provider Care Teams.  These Care Teams include your primary Cardiologist (physician) and Advanced Practice Providers (APPs -  Physician Assistants and Nurse Practitioners) who all work together to provide you with the care you need, when you need it.  We recommend signing up for the patient portal called "MyChart".  Sign up information is provided on this After Visit Summary.  MyChart is used to connect with patients for Virtual Visits (Telemedicine).  Patients are able to view lab/test results, encounter notes, upcoming appointments, etc.  Non-urgent messages can be sent to your provider as well.   To learn more about what you can do with MyChart, go to [email or web address].    Your next appointment:   Follow up with Dr. Sallyanne Kuster after the echo and cardioversion Other Instructions  You are scheduled for a Cardioversion on 06/04/22 with Dr. Sallyanne Kuster.  Please arrive at the [facility] (Main Entrance A) at [facility]: [address] [place],  [address] at  6:30 am. (1 hour prior to procedure)  DIET: Nothing to eat or drink after midnight except a sip of water with medications (see medication instructions below)  FYI: For your safety, and to allow [place] to monitor your vital signs accurately during the surgery/procedure we request that   if you have artificial nails, gel coating, SNS etc. Please have those removed prior to your surgery/procedure. Not having the nail coverings /polish removed may result in cancellation or delay of your surgery/procedure.   Medication Instructions: Hold nothing to hold  Continue your anticoagulant: Eliquis You will need to continue your anticoagulant after your procedure until you  are told by your  Provider that it is safe to stop   Labs:  Your provider would like for you to return  on 05/29/22 to have the following labs drawn: CBC and BMET. You do not need an appointment for the lab. Once in our office lobby there is a podium where you can sign in and ring the doorbell to alert [place] that you are here. The lab is open from 8:00 am to 4 pm; closed for lunch from 12:45pm-1:45pm.  You may also go to any of these [facility] locations:   [place] ([place]) - [place] must have a responsible person to drive you home and stay in the waiting area during your procedure. Failure to do so could result in cancellation.  Bring your insurance cards.  *Special Note: Every effort is made to have your  procedure done on time. Occasionally there are emergencies that occur at the hospital that may cause delays. Please be patient if a delay does occur.

## 2022-05-21 ENCOUNTER — Other Ambulatory Visit: Payer: Self-pay

## 2022-05-23 LAB — MULTIPLE MYELOMA PANEL, SERUM
Albumin SerPl Elph-Mcnc: 3.5 g/dL (ref 2.9–4.4)
Albumin/Glob SerPl: 1.7 (ref 0.7–1.7)
Alpha 1: 0.3 g/dL (ref 0.0–0.4)
Alpha2 Glob SerPl Elph-Mcnc: 0.5 g/dL (ref 0.4–1.0)
B-Globulin SerPl Elph-Mcnc: 0.9 g/dL (ref 0.7–1.3)
Gamma Glob SerPl Elph-Mcnc: 0.4 g/dL (ref 0.4–1.8)
Globulin, Total: 2.1 g/dL — ABNORMAL LOW (ref 2.2–3.9)
IgA: 29 mg/dL — ABNORMAL LOW (ref 61–437)
IgG (Immunoglobin G), Serum: 472 mg/dL — ABNORMAL LOW (ref 603–1613)
IgM (Immunoglobulin M), Srm: 13 mg/dL — ABNORMAL LOW (ref 15–143)
Total Protein ELP: 5.6 g/dL — ABNORMAL LOW (ref 6.0–8.5)

## 2022-05-26 ENCOUNTER — Other Ambulatory Visit: Payer: Self-pay

## 2022-05-26 ENCOUNTER — Inpatient Hospital Stay: Payer: No Typology Code available for payment source

## 2022-05-26 DIAGNOSIS — Z5112 Encounter for antineoplastic immunotherapy: Secondary | ICD-10-CM | POA: Diagnosis not present

## 2022-05-26 DIAGNOSIS — C9000 Multiple myeloma not having achieved remission: Secondary | ICD-10-CM

## 2022-05-26 LAB — CBC WITH DIFFERENTIAL (CANCER CENTER ONLY)
Abs Immature Granulocytes: 0.01 10*3/uL (ref 0.00–0.07)
Basophils Absolute: 0 10*3/uL (ref 0.0–0.1)
Basophils Relative: 0 %
Eosinophils Absolute: 0.1 10*3/uL (ref 0.0–0.5)
Eosinophils Relative: 6 %
HCT: 31.8 % — ABNORMAL LOW (ref 39.0–52.0)
Hemoglobin: 10.3 g/dL — ABNORMAL LOW (ref 13.0–17.0)
Immature Granulocytes: 0 %
Lymphocytes Relative: 39 %
Lymphs Abs: 0.9 10*3/uL (ref 0.7–4.0)
MCH: 33.9 pg (ref 26.0–34.0)
MCHC: 32.4 g/dL (ref 30.0–36.0)
MCV: 104.6 fL — ABNORMAL HIGH (ref 80.0–100.0)
Monocytes Absolute: 0.5 10*3/uL (ref 0.1–1.0)
Monocytes Relative: 23 %
Neutro Abs: 0.8 10*3/uL — ABNORMAL LOW (ref 1.7–7.7)
Neutrophils Relative %: 32 %
Platelet Count: 158 10*3/uL (ref 150–400)
RBC: 3.04 MIL/uL — ABNORMAL LOW (ref 4.22–5.81)
RDW: 18.3 % — ABNORMAL HIGH (ref 11.5–15.5)
WBC Count: 2.4 10*3/uL — ABNORMAL LOW (ref 4.0–10.5)
nRBC: 0 % (ref 0.0–0.2)

## 2022-05-26 LAB — CMP (CANCER CENTER ONLY)
ALT: 14 U/L (ref 0–44)
AST: 22 U/L (ref 15–41)
Albumin: 4 g/dL (ref 3.5–5.0)
Alkaline Phosphatase: 59 U/L (ref 38–126)
Anion gap: 10 (ref 5–15)
BUN: 11 mg/dL (ref 8–23)
CO2: 22 mmol/L (ref 22–32)
Calcium: 8.6 mg/dL — ABNORMAL LOW (ref 8.9–10.3)
Chloride: 106 mmol/L (ref 98–111)
Creatinine: 1.7 mg/dL — ABNORMAL HIGH (ref 0.61–1.24)
GFR, Estimated: 41 mL/min — ABNORMAL LOW (ref >60)
Glucose, Bld: 130 mg/dL — ABNORMAL HIGH (ref 70–99)
Potassium: 4 mmol/L (ref 3.5–5.1)
Sodium: 138 mmol/L (ref 135–145)
Total Bilirubin: 0.6 mg/dL (ref 0.3–1.2)
Total Protein: 6 g/dL — ABNORMAL LOW (ref 6.5–8.1)

## 2022-05-26 LAB — LACTATE DEHYDROGENASE: LDH: 269 U/L — ABNORMAL HIGH (ref 98–192)

## 2022-05-26 NOTE — Progress Notes (Signed)
Per Dr. [name] hold treatment today for ANC of 0.8

## 2022-06-03 ENCOUNTER — Encounter (HOSPITAL_COMMUNITY): Payer: Self-pay | Admitting: Certified Registered Nurse Anesthetist

## 2022-06-03 ENCOUNTER — Other Ambulatory Visit: Payer: Self-pay

## 2022-06-03 ENCOUNTER — Inpatient Hospital Stay: Payer: No Typology Code available for payment source

## 2022-06-03 ENCOUNTER — Telehealth: Payer: Self-pay | Admitting: *Deleted

## 2022-06-03 ENCOUNTER — Other Ambulatory Visit: Payer: Self-pay | Admitting: Hematology and Oncology

## 2022-06-03 ENCOUNTER — Inpatient Hospital Stay (HOSPITAL_BASED_OUTPATIENT_CLINIC_OR_DEPARTMENT_OTHER): Payer: No Typology Code available for payment source | Admitting: Hematology and Oncology

## 2022-06-03 VITALS — BP 194/89

## 2022-06-03 VITALS — BP 193/101 | HR 65 | Temp 98.9°F | Resp 16 | Ht 77.0 in | Wt 220.7 lb

## 2022-06-03 DIAGNOSIS — C9000 Multiple myeloma not having achieved remission: Secondary | ICD-10-CM | POA: Diagnosis not present

## 2022-06-03 DIAGNOSIS — Z5112 Encounter for antineoplastic immunotherapy: Secondary | ICD-10-CM | POA: Diagnosis not present

## 2022-06-03 LAB — CBC WITH DIFFERENTIAL (CANCER CENTER ONLY)
Abs Immature Granulocytes: 0.01 10*3/uL (ref 0.00–0.07)
Basophils Absolute: 0 10*3/uL (ref 0.0–0.1)
Basophils Relative: 1 %
Eosinophils Absolute: 0.4 10*3/uL (ref 0.0–0.5)
Eosinophils Relative: 11 %
HCT: 33.7 % — ABNORMAL LOW (ref 39.0–52.0)
Hemoglobin: 10.8 g/dL — ABNORMAL LOW (ref 13.0–17.0)
Immature Granulocytes: 0 %
Lymphocytes Relative: 30 %
Lymphs Abs: 1 10*3/uL (ref 0.7–4.0)
MCH: 33.3 pg (ref 26.0–34.0)
MCHC: 32 g/dL (ref 30.0–36.0)
MCV: 104 fL — ABNORMAL HIGH (ref 80.0–100.0)
Monocytes Absolute: 0.6 10*3/uL (ref 0.1–1.0)
Monocytes Relative: 18 %
Neutro Abs: 1.4 10*3/uL — ABNORMAL LOW (ref 1.7–7.7)
Neutrophils Relative %: 40 %
Platelet Count: 137 10*3/uL — ABNORMAL LOW (ref 150–400)
RBC: 3.24 MIL/uL — ABNORMAL LOW (ref 4.22–5.81)
RDW: 17.2 % — ABNORMAL HIGH (ref 11.5–15.5)
WBC Count: 3.4 10*3/uL — ABNORMAL LOW (ref 4.0–10.5)
nRBC: 0 % (ref 0.0–0.2)

## 2022-06-03 LAB — CMP (CANCER CENTER ONLY)
ALT: 12 U/L (ref 0–44)
AST: 19 U/L (ref 15–41)
Albumin: 4 g/dL (ref 3.5–5.0)
Alkaline Phosphatase: 52 U/L (ref 38–126)
Anion gap: 7 (ref 5–15)
BUN: 16 mg/dL (ref 8–23)
CO2: 25 mmol/L (ref 22–32)
Calcium: 9 mg/dL (ref 8.9–10.3)
Chloride: 108 mmol/L (ref 98–111)
Creatinine: 1.91 mg/dL — ABNORMAL HIGH (ref 0.61–1.24)
GFR, Estimated: 36 mL/min — ABNORMAL LOW (ref >60)
Glucose, Bld: 146 mg/dL — ABNORMAL HIGH (ref 70–99)
Potassium: 4.2 mmol/L (ref 3.5–5.1)
Sodium: 140 mmol/L (ref 135–145)
Total Bilirubin: 0.5 mg/dL (ref 0.3–1.2)
Total Protein: 6.2 g/dL — ABNORMAL LOW (ref 6.5–8.1)

## 2022-06-03 LAB — LACTATE DEHYDROGENASE: LDH: 268 U/L — ABNORMAL HIGH (ref 98–192)

## 2022-06-03 MED ORDER — SODIUM CHLORIDE 0.9 % IV SOLN: Freq: Once | INTRAVENOUS | Status: AC

## 2022-06-03 MED ORDER — PALONOSETRON HCL INJECTION 0.25 MG/5ML
0.2500 mg | Freq: Once | INTRAVENOUS | Status: AC
  Administered 2022-06-03: 0.25 mg via INTRAVENOUS
  Filled 2022-06-03: qty 5

## 2022-06-03 MED ORDER — BORTEZOMIB CHEMO SQ INJECTION 3.5 MG (2.5MG/ML)
1.5000 mg/m2 | Freq: Once | INTRAMUSCULAR | Status: AC
  Administered 2022-06-03: 3.5 mg via SUBCUTANEOUS
  Filled 2022-06-03: qty 1.4

## 2022-06-03 MED ORDER — AMLODIPINE BESYLATE 5 MG PO TABS: 5.0000 mg | ORAL_TABLET | Freq: Every day | ORAL | 2 refills | Status: DC

## 2022-06-03 MED ORDER — SODIUM CHLORIDE 0.9 % IV SOLN
40.0000 mg | Freq: Once | INTRAVENOUS | Status: AC
  Administered 2022-06-03: 40 mg via INTRAVENOUS
  Filled 2022-06-03: qty 4

## 2022-06-03 MED ORDER — SODIUM CHLORIDE 0.9 % IV SOLN
225.0000 mg/m2 | Freq: Once | INTRAVENOUS | Status: AC
  Administered 2022-06-03: 520 mg via INTRAVENOUS
  Filled 2022-06-03: qty 26

## 2022-06-03 NOTE — Progress Notes (Signed)
Dr. [name] aware of elevated BP. OK to treat today with Velcade and cytoxan. 06/03/2022: Cycle 4 Day 1 of CyBorD chemotherapy. "25% dose reduction of cyclophosphamide due to cytopenias" (Per Dr. Libby Maw note 9/26) Pt took his BP meds in the office after seeing Dr. [name] this morning

## 2022-06-03 NOTE — Patient Instructions (Signed)
[facility]  Discharge Instructions: Thank you for choosing [facility] to provide your oncology and hematology care.   If you have a lab appointment with the [facility], please go directly to the [facility] and check in at the registration area.   Wear comfortable clothing and clothing appropriate for easy access to any Portacath or PICC line.   We strive to give you quality time with your provider. You may need to reschedule your appointment if you arrive late (15 or more minutes).  Arriving late affects you and other patients whose appointments are after yours.  Also, if you miss three or more appointments without notifying the office, you may be dismissed from the clinic at the provider's discretion.      For prescription refill requests, have your pharmacy contact our office and allow 72 hours for refills to be completed.    Today you received the following chemotherapy and/or immunotherapy agents:  Velcade and Cyclophosphamide      To help prevent nausea and vomiting after your treatment, we encourage you to take your nausea medication as directed.  BELOW ARE SYMPTOMS THAT SHOULD BE REPORTED IMMEDIATELY: *FEVER GREATER THAN 100.4 F (38 C) OR HIGHER *CHILLS OR SWEATING *NAUSEA AND VOMITING THAT IS NOT CONTROLLED WITH YOUR NAUSEA MEDICATION *UNUSUAL SHORTNESS OF BREATH *UNUSUAL BRUISING OR BLEEDING *URINARY PROBLEMS (pain or burning when urinating, or frequent urination) *BOWEL PROBLEMS (unusual diarrhea, constipation, pain near the anus) TENDERNESS IN MOUTH AND THROAT WITH OR WITHOUT PRESENCE OF ULCERS (sore throat, sores in mouth, or a toothache) UNUSUAL RASH, SWELLING OR PAIN  UNUSUAL VAGINAL DISCHARGE OR ITCHING   Items with * indicate a potential emergency and should be followed up as soon as possible or go to the Emergency Department if any problems should occur.  Please show the CHEMOTHERAPY ALERT CARD or IMMUNOTHERAPY ALERT  CARD at check-in to the Emergency Department and triage nurse.  Should you have questions after your visit or need to cancel or reschedule your appointment, please contact [facility]  Dept: [phone]  and follow the prompts.  Office hours are 8:00 a.m. to 4:30 p.m. Monday - Friday. Please note that voicemails left after 4:00 p.m. may not be returned until the following business day.  We are closed weekends and major holidays. You have access to a nurse at all times for urgent questions. Please call the main number to the clinic Dept: [phone] and follow the prompts.   For any non-urgent questions, you may also contact your provider using MyChart. We now offer e-Visits for anyone 37 and older to request care online for non-urgent symptoms. For details visit [email or web address].   Also download the MyChart app! Go to the app store, search "MyChart", open the app, select [facility], and log in with your MyChart username and password.  Masks are optional in the cancer centers. If you would like for your care team to wear a mask while they are taking care of you, please let them know. You may have one support person who is at least 77 years old accompany you for your appointments.

## 2022-06-03 NOTE — Telephone Encounter (Signed)
The patient's wife called in stating that the [place] did not approve of the cardioversion scheduled for tomorrow and they needed to cancel. Tried calling central scheduling and they had already left for the day. The cardioversion is scheduled for 7:30 am.

## 2022-06-03 NOTE — Progress Notes (Signed)
[place] [phone]   [phone]  PROGRESS NOTE  Patient Care Team: [name], MD as PCP - General (Internal Medicine)  Hematological/Oncological History # IgA Lambda Multiple Myeloma 05/03/2015: last visit with Dr. Julien Nordmann at the [facility]. Was followed for IgA lambda MGUS. 12/11/2021: labs show M protein 3.4, Kappa 19.2, Lambda 1576.4, ratio 0.01. Cr 3.47, Hgb 8.6, WBC 5.7, MCV 99, Plt 221 12/23/2021: establish care with Dr. [name]  01/16/2022: Bone marrow biopsy performed, showed a 60% cellular bone marrow predominantly comprised of plasma cells making up 70 to 80%, lambda restricted.  Myeloma FISH panel showed no evidence of abnormalities. 02/07/2022: Cycle 1 Day 1 of CyBorD chemotherapy.  03/09/2022-03/17/2022: hospitalized for fever/pneumonia.  03/24/2022: Cycle 2 Day 1 of CyBorD chemotherapy.  04/22/2022: Cycle 3 Day 1 of CyBorD chemotherapy 05/19/2022-05/26/2022: Delayed start of Cycle 4 Day 1 of CyBorD chemotherapy due to neutropenia.  06/03/2022: Cycle 4 Day 1 of CyBorD chemotherapy. 25% dose reduction of cyclophosphamide due to cytopenias.  Interval History:  Samwise Eckardt. 76 y.o. male with medical history significant for IgA lambda multiple myeloma who presents for a follow up visit. The patient's last visit was on 05/19/2022. In the interim since the last visit chemotherapy has been held due to neutropenia.   On exam today Mr. Gilchrest reports he feels well today.  He has that his blood pressure is elevated because he did not take his blood pressure medications this morning.  He is not having any headache or vision changes.  He reports he can tell when it is high as he can "hear it in his ears".  He notes he is not having any other concerning symptoms at this time.  He reports he does continue to have some numbness in his hands and feet, predominantly his feet.  He reports the feeling is "blank".  He notes he does have a little bit of  pain in his leg which is well helped by the gabapentin.  He reports that he fell yesterday after he lost his balance.  He usually uses a walker or cane but did not have it with him at this time when he lost balance.  He has not been having any nausea, vomiting, or diarrhea.  He reports he actually takes medications in order to keep his bowels moving regularly.  Patient denies any fevers, chills, night sweats, shortness of breath, chest pain, cough, neuropathy, peripheral edema, headaches or dizziness.  He has no other complaints.  A full 10 point ROS is otherwise negative.  MEDICAL HISTORY:  Past Medical History:  Diagnosis Date   Allergy    Anxiety    Arthritis    Depression    Heart murmur    Hyperlipidemia    Hyperplastic colon polyp    Hyperproteinemia    Hypertension    Hypothyroidism    Mitral valve prolapse    Paroxysmal atrial fibrillation (HCC)    Thyroid disease     SURGICAL HISTORY: Past Surgical History:  Procedure Laterality Date   CARDIOVERSION N/A 03/14/2022   Procedure: CARDIOVERSION;  Surgeon: Pixie Casino, MD;  Location: [facility];  Service: Cardiovascular;  Laterality: N/A;   TEE WITHOUT CARDIOVERSION N/A 03/14/2022   Procedure: TRANSESOPHAGEAL ECHOCARDIOGRAM (TEE);  Surgeon: Pixie Casino, MD;  Location: [facility] ENDOSCOPY;  Service: Cardiovascular;  Laterality: N/A;    SOCIAL HISTORY: Social History   Socioeconomic History   Marital status: Married    Spouse name: Not on file  Number of children: Not on file   Years of education: Not on file   Highest education level: Not on file  Occupational History   Not on file  Tobacco Use   Smoking status: Former    Years: 20.00    Types: Cigarettes    Quit date: 10/01/1978    Years since quitting: 43.7   Smokeless tobacco: Never   Tobacco comments:    Former smoker 04/03/22  Substance and Sexual Activity   Alcohol use: Yes    Alcohol/week: 2.0 standard drinks of alcohol    Types: 2 Glasses of wine per  week    Comment: occasionally   Drug use: No   Sexual activity: Not on file  Other Topics Concern   Not on file  Social History Narrative   Not on file   Social Determinants of Health   Financial Resource Strain: Not on file  Food Insecurity: Not on file  Transportation Needs: Not on file  Physical Activity: Not on file  Stress: Not on file  Social Connections: Not on file  Intimate Partner Violence: Not on file    FAMILY HISTORY: Family History  Problem Relation Age of Onset   Cancer Father    Colon cancer Neg Hx    Esophageal cancer Neg Hx    Liver cancer Neg Hx    Pancreatic cancer Neg Hx    Rectal cancer Neg Hx    Stomach cancer Neg Hx     ALLERGIES:  is allergic to zithromax [azithromycin].  MEDICATIONS:  Current Outpatient Medications  Medication Sig Dispense Refill   acyclovir (ZOVIRAX) 400 MG tablet Take 1 tablet (400 mg total) by mouth 2 (two) times daily. 180 tablet 1   allopurinol (ZYLOPRIM) 300 MG tablet Take 1 tablet (300 mg total) by mouth daily. (Patient taking differently: Take 300 mg by mouth daily in the afternoon.) 90 tablet 1   ALPRAZolam (XANAX) 0.25 MG tablet Take 0.25 mg by mouth 2 (two) times daily as needed for anxiety.     amiodarone (PACERONE) 200 MG tablet Take 1 tablet (200 mg total) by mouth daily. 30 tablet 3   apixaban (ELIQUIS) 5 MG TABS tablet Take 1 tablet (5 mg total) by mouth 2 (two) times daily. 180 tablet 1   atorvastatin (LIPITOR) 20 MG tablet Take 20 mg by mouth daily in the afternoon.     dexamethasone (DECADRON) 4 MG tablet Take 10 tablets (40 mg total) by mouth once a week. Take steroids pills the in the morning on chemotherapy days. 120 tablet 1   ergocalciferol (VITAMIN D2) 1.25 MG (50000 UT) capsule Take 50,000 Units by mouth every Wednesday.     ferrous gluconate (FERGON) 324 MG tablet Take 1 tablet (324 mg total) by mouth 3 (three) times daily with meals. 90 tablet 0   fluticasone (FLONASE) 50 MCG/ACT nasal spray Place 2  sprays into the nose daily as needed for allergies.     gabapentin (NEURONTIN) 300 MG capsule Take 1 capsule (300 mg total) by mouth 2 (two) times daily. 60 capsule 1   levothyroxine (SYNTHROID) 100 MCG tablet Take 100 mcg by mouth See admin instructions. Take 1 tablet (100 mcg) by mouth on Mondays, Tuesdays, Wednesdays, Thursdays, Fridays & Saturdays in the morning. Skip a dose on Sundays.     loratadine (CLARITIN) 10 MG tablet Take 10 mg by mouth daily as needed for allergies.     meclizine (ANTIVERT) 25 MG tablet Take 1 tablet (25 mg total) by mouth  3 (three) times daily as needed for dizziness. 30 tablet 0   metoprolol succinate (TOPROL-XL) 25 MG 24 hr tablet Take 1 tablet (25 mg total) by mouth daily. Take with or immediately following a meal. 30 tablet 3   prochlorperazine (COMPAZINE) 10 MG tablet Take 1 tablet (10 mg total) by mouth every 6 (six) hours as needed for nausea or vomiting. 30 tablet 0   senna-docusate (SENOKOT-S) 8.6-50 MG tablet Take 1 tablet by mouth at bedtime as needed for mild constipation.     traMADol (ULTRAM) 50 MG tablet Take 1 tablet (50 mg total) by mouth every 6 (six) hours as needed. 30 tablet 0   vitamin B-12 (CYANOCOBALAMIN) 500 MCG tablet Take 500 mcg by mouth in the morning.     Current Facility-Administered Medications  Medication Dose Route Frequency Provider Last Rate Last Admin   0.9 %  sodium chloride infusion  500 mL Intravenous Once Armbruster, Carlota Raspberry, MD        REVIEW OF SYSTEMS:   Constitutional: ( - ) fevers, ( - )  chills , ( - ) night sweats Eyes: ( - ) blurriness of vision, ( - ) double vision, ( - ) watery eyes Ears, nose, mouth, throat, and face: ( - ) mucositis, ( - ) sore throat Respiratory: ( - ) cough, ( - ) dyspnea, ( - ) wheezes Cardiovascular: ( - ) palpitation, ( - ) chest discomfort, ( - ) lower extremity swelling Gastrointestinal:  ( - ) nausea, ( - ) heartburn, ( - ) change in bowel habits Skin: ( - ) abnormal skin  rashes Lymphatics: ( - ) new lymphadenopathy, ( - ) easy bruising Neurological: ( - ) numbness, ( - ) tingling, ( - ) new weaknesses Behavioral/Psych: ( - ) mood change, ( - ) new changes  All other systems were reviewed with the patient and are negative.  PHYSICAL EXAMINATION: ECOG PERFORMANCE STATUS: 1 - Symptomatic but completely ambulatory  Vitals:   06/03/22 0857 06/03/22 0922  BP: (!) 195/102 (!) 193/101  Pulse: 65   Resp: 16   Temp: 98.9 F (37.2 C)   SpO2: 100%       Filed Weights   06/03/22 0857  Weight: 220 lb 11.2 oz (100.1 kg)    GENERAL: Chronically ill-appearing elderly African-American male, alert, no distress and comfortable SKIN: skin color, texture, turgor are normal, no rashes or significant lesions EYES: conjunctiva are pink and non-injected, sclera clear LUNGS: clear to auscultation and percussion with normal breathing effort HEART: regular rate & rhythm and no murmurs and no lower extremity edema Musculoskeletal: no cyanosis of digits and no clubbing  PSYCH: alert & oriented x 3, fluent speech NEURO: no focal motor/sensory deficits  LABORATORY DATA:  I have reviewed the data as listed    Latest Ref Rng & Units 06/03/2022    8:34 AM 05/26/2022    8:43 AM 05/19/2022    9:29 AM  CBC  WBC 4.0 - 10.5 K/uL 3.4  2.4  2.1   Hemoglobin 13.0 - 17.0 g/dL 10.8  10.3  9.7   Hematocrit 39.0 - 52.0 % 33.7  31.8  30.1   Platelets 150 - 400 K/uL 137  158  140        Latest Ref Rng & Units 06/03/2022    8:34 AM 05/26/2022    8:43 AM 05/19/2022    9:29 AM  CMP  Glucose 70 - 99 mg/dL 146  130  128   BUN  8 - 23 mg/dL _0 Creatinine 0.61 - 1.24 mg/dL 1.91  1.70  1.92   Sodium 135 - 145 mmol/L 140  138  140   Potassium 3.5 - 5.1 mmol/L 4.2  4.0  4.1   Chloride 98 - 111 mmol/L 108  106  109   CO2 22 - 32 mmol/L _1 Calcium 8.9 - 10.3 mg/dL 9.0  8.6  8.7   Total Protein 6.5 - 8.1 g/dL 6.2  6.0  6.1   Total Bilirubin 0.3 - 1.2 mg/dL 0.5  0.6   0.8   Alkaline Phos 38 - 126 U/L 52  59  58   AST 15 - 41 U/L _2 ALT 0 - 44 U/L _3 Lab Results  Component Value Date   MPROTEIN Not Observed 05/19/2022   MPROTEIN Not Observed 05/05/2022   MPROTEIN 0.1 (H) 04/07/2022   Lab Results  Component Value Date   KPAFRELGTCHN 10.9 05/19/2022   KPAFRELGTCHN 10.6 05/05/2022   KPAFRELGTCHN 14.5 04/07/2022   LAMBDASER 6.6 05/19/2022   LAMBDASER 6.1 05/05/2022   LAMBDASER 6.6 04/07/2022   KAPLAMBRATIO 1.65 05/19/2022   KAPLAMBRATIO 1.74 (H) 05/05/2022   KAPLAMBRATIO 2.20 (H) 04/07/2022    RADIOGRAPHIC STUDIES: No results found.  ASSESSMENT & PLAN Chad Donoghue. 75 y.o. male with medical history significant for IgA lambda multiple myeloma who presents for a follow up visit.   Previously we discussed the disease process of multiple myeloma.  We discussed the incurable nature of this disease and the nature of the treatment.  We discussed that this is a cancer of the bone marrow with plasma cells producing an abundance of M protein which in this patient's case causes his severe renal dysfunction.  Additionally we noted that myeloma can cause lytic lesions of the bone, anemia, and increased levels of calcium.  We discussed the treatment moving forward including doublet and triplet therapy.  Given the patient's good health overall, but poor kidney function I would recommend proceeding with CyBorD chemotherapy.  Mr. Delisle voices understanding of the treatment regimen and the plan moving forward.  At this time we will plan to proceed with Velcade 1.5 mg per metered squared subq weekly, cyclophosphamide 300 mg per metered squared IV weekly, and dexamethasone 20 mg p.o. weekly.  Cycles will consist of 4 weekly treatments and will continue for a minimum of 6 cycles before consideration of maintenance therapy.  Additionally if kidney function improves can consider transitioning regimen to VRD.    # IgA lambda Multiple  Myeloma -- Bone marrow biopsy performed on 01/16/2022 showed increased plasma cells consistent with a plasma cell neoplasm.  Normal FISH panel. --Patient meets diagnostic criteria based on kidney dysfunction and anemia. --Cycle 1 Day 1 of CyBorD chemotherapy started on 02/07/2022 Plan: --Most recent SPEP from 04/07/2022 showed M protein 0.1 g/dL, kappa 14.5, lambda 6.6, ratio 2.20. This is a robust response to therapy.  --today is Cycle 4 Day 1 of CyBorD --Labs today show white blood cell 3.4, hemoglobin 10.8, MCV 104, and platelets of 137. ANC 1.4.  --Cr 1.91, stable --RTC in 2 weeks with interval weekly treatments   # Leg Pain -- Likely a component of worsening neuropathy from his chemotherapy with pre-existing pain. -- continue gabapentin to 300 mg twice daily -- continue tramadol 50 mg every 6 hours as needed  #Supportive Care --  chemotherapy education complete -- port placement not required.  -- zofran 93m q8H PRN and compazine 129mPO q6H for nausea -- acyclovir 4009mO BID for VCZ prophylaxis -- allopurinol 300m31m daily for TLS prophylaxis --zometa to start after dental clearance.   Orders Placed This Encounter  Procedures   Lactate dehydrogenase (LDH)    Standing Status:   Future    Standing Expiration Date:   07/01/2023   Multiple Myeloma Panel (SPEP&IFE w/QIG)    Standing Status:   Future    Standing Expiration Date:   07/01/2023   Kappa/lambda light chains    Standing Status:   Future    Standing Expiration Date:   07/01/2023   CBC with Differential ([facility])    Standing Status:   Future    Standing Expiration Date:   07/02/2023   CMP ([facility])    Standing Status:   Future    Standing Expiration Date:   07/02/2023   Lactate dehydrogenase (LDH)    Standing Status:   Future    Standing Expiration Date:   07/08/2023   CBC with Differential ([facility])    Standing Status:   Future    Standing Expiration Date:   07/09/2023   CMP  ([facility])    Standing Status:   Future    Standing Expiration Date:   07/09/2023   Lactate dehydrogenase (LDH)    Standing Status:   Future    Standing Expiration Date:   07/15/2023   CBC with Differential ([facility])    Standing Status:   Future    Standing Expiration Date:   07/16/2023   CMP ([facility])    Standing Status:   Future    Standing Expiration Date:   07/16/2023   Lactate dehydrogenase (LDH)    Standing Status:   Future    Standing Expiration Date:   07/22/2023   CBC with Differential ([facility])    Standing Status:   Future    Standing Expiration Date:   07/23/2023   CMP ([facility])    Standing Status:   Future    Standing Expiration Date:   07/23/2023   All questions were answered. The patient knows to call the clinic with any problems, questions or concerns.  I have spent a total of 30 minutes minutes of face-to-face and non-face-to-face time, preparing to see the patient, performing a medically appropriate examination, counseling and educating the patient, documenting clinical information in the electronic health record,  and care coordination.   JohnLedell Peoples Department of Hematology/Oncology [facility]: [phone]: [phone]: [email or web address]  06/03/2022 1:10 PM

## 2022-06-04 ENCOUNTER — Encounter (HOSPITAL_COMMUNITY): Admission: RE | Payer: Self-pay | Source: Ambulatory Visit

## 2022-06-04 ENCOUNTER — Ambulatory Visit (HOSPITAL_COMMUNITY)
Admission: RE | Admit: 2022-06-04 | Payer: No Typology Code available for payment source | Source: Ambulatory Visit | Admitting: Cardiovascular Disease

## 2022-06-04 ENCOUNTER — Other Ambulatory Visit: Payer: Self-pay

## 2022-06-04 LAB — KAPPA/LAMBDA LIGHT CHAINS
Kappa free light chain: 11.4 mg/L (ref 3.3–19.4)
Kappa, lambda light chain ratio: 1.87 — ABNORMAL HIGH (ref 0.26–1.65)
Lambda free light chains: 6.1 mg/L (ref 5.7–26.3)

## 2022-06-04 SURGERY — CARDIOVERSION
Anesthesia: General

## 2022-06-04 NOTE — Telephone Encounter (Signed)
Any reason given for declining the procedure?

## 2022-06-05 ENCOUNTER — Other Ambulatory Visit: Payer: Self-pay

## 2022-06-05 NOTE — Telephone Encounter (Signed)
Called and spoke with the patient. He stated that the [place] did not get the paperwork in time. They will review the need for the cardioversion and get back to the patient. He has an appointment with his PCP on 10/4 where this will also be addressed.

## 2022-06-06 ENCOUNTER — Other Ambulatory Visit (HOSPITAL_COMMUNITY): Payer: No Typology Code available for payment source

## 2022-06-07 ENCOUNTER — Other Ambulatory Visit: Payer: Self-pay | Admitting: Hematology and Oncology

## 2022-06-08 ENCOUNTER — Encounter: Payer: Self-pay | Admitting: Hematology and Oncology

## 2022-06-09 ENCOUNTER — Inpatient Hospital Stay: Payer: No Typology Code available for payment source

## 2022-06-09 ENCOUNTER — Other Ambulatory Visit: Payer: Self-pay

## 2022-06-09 ENCOUNTER — Inpatient Hospital Stay: Payer: No Typology Code available for payment source | Attending: Hematology and Oncology

## 2022-06-09 VITALS — BP 154/78 | HR 60 | Temp 98.0°F | Resp 18 | Ht 77.0 in | Wt 219.0 lb

## 2022-06-09 DIAGNOSIS — R0981 Nasal congestion: Secondary | ICD-10-CM | POA: Diagnosis not present

## 2022-06-09 DIAGNOSIS — Z8719 Personal history of other diseases of the digestive system: Secondary | ICD-10-CM | POA: Insufficient documentation

## 2022-06-09 DIAGNOSIS — Z79899 Other long term (current) drug therapy: Secondary | ICD-10-CM | POA: Diagnosis not present

## 2022-06-09 DIAGNOSIS — Z87891 Personal history of nicotine dependence: Secondary | ICD-10-CM | POA: Insufficient documentation

## 2022-06-09 DIAGNOSIS — C9000 Multiple myeloma not having achieved remission: Secondary | ICD-10-CM | POA: Insufficient documentation

## 2022-06-09 DIAGNOSIS — M79606 Pain in leg, unspecified: Secondary | ICD-10-CM | POA: Insufficient documentation

## 2022-06-09 DIAGNOSIS — G629 Polyneuropathy, unspecified: Secondary | ICD-10-CM | POA: Insufficient documentation

## 2022-06-09 DIAGNOSIS — Z5111 Encounter for antineoplastic chemotherapy: Secondary | ICD-10-CM | POA: Insufficient documentation

## 2022-06-09 DIAGNOSIS — Z881 Allergy status to other antibiotic agents status: Secondary | ICD-10-CM | POA: Diagnosis not present

## 2022-06-09 DIAGNOSIS — Z809 Family history of malignant neoplasm, unspecified: Secondary | ICD-10-CM | POA: Diagnosis not present

## 2022-06-09 DIAGNOSIS — Z5112 Encounter for antineoplastic immunotherapy: Secondary | ICD-10-CM | POA: Diagnosis present

## 2022-06-09 DIAGNOSIS — Z7901 Long term (current) use of anticoagulants: Secondary | ICD-10-CM | POA: Insufficient documentation

## 2022-06-09 DIAGNOSIS — D649 Anemia, unspecified: Secondary | ICD-10-CM | POA: Diagnosis not present

## 2022-06-09 LAB — CBC WITH DIFFERENTIAL (CANCER CENTER ONLY)
Abs Immature Granulocytes: 0.01 10*3/uL (ref 0.00–0.07)
Basophils Absolute: 0 10*3/uL (ref 0.0–0.1)
Basophils Relative: 0 %
Eosinophils Absolute: 0.4 10*3/uL (ref 0.0–0.5)
Eosinophils Relative: 14 %
HCT: 34.8 % — ABNORMAL LOW (ref 39.0–52.0)
Hemoglobin: 11.2 g/dL — ABNORMAL LOW (ref 13.0–17.0)
Immature Granulocytes: 0 %
Lymphocytes Relative: 29 %
Lymphs Abs: 0.8 10*3/uL (ref 0.7–4.0)
MCH: 32.9 pg (ref 26.0–34.0)
MCHC: 32.2 g/dL (ref 30.0–36.0)
MCV: 102.4 fL — ABNORMAL HIGH (ref 80.0–100.0)
Monocytes Absolute: 0.5 10*3/uL (ref 0.1–1.0)
Monocytes Relative: 18 %
Neutro Abs: 1.1 10*3/uL — ABNORMAL LOW (ref 1.7–7.7)
Neutrophils Relative %: 39 %
Platelet Count: 123 10*3/uL — ABNORMAL LOW (ref 150–400)
RBC: 3.4 MIL/uL — ABNORMAL LOW (ref 4.22–5.81)
RDW: 16.6 % — ABNORMAL HIGH (ref 11.5–15.5)
WBC Count: 2.8 10*3/uL — ABNORMAL LOW (ref 4.0–10.5)
nRBC: 0 % (ref 0.0–0.2)

## 2022-06-09 LAB — CMP (CANCER CENTER ONLY)
ALT: 14 U/L (ref 0–44)
AST: 19 U/L (ref 15–41)
Albumin: 4.2 g/dL (ref 3.5–5.0)
Alkaline Phosphatase: 61 U/L (ref 38–126)
Anion gap: 6 (ref 5–15)
BUN: 18 mg/dL (ref 8–23)
CO2: 24 mmol/L (ref 22–32)
Calcium: 9.1 mg/dL (ref 8.9–10.3)
Chloride: 108 mmol/L (ref 98–111)
Creatinine: 1.68 mg/dL — ABNORMAL HIGH (ref 0.61–1.24)
GFR, Estimated: 42 mL/min — ABNORMAL LOW (ref >60)
Glucose, Bld: 100 mg/dL — ABNORMAL HIGH (ref 70–99)
Potassium: 4.2 mmol/L (ref 3.5–5.1)
Sodium: 138 mmol/L (ref 135–145)
Total Bilirubin: 0.6 mg/dL (ref 0.3–1.2)
Total Protein: 6 g/dL — ABNORMAL LOW (ref 6.5–8.1)

## 2022-06-09 LAB — MULTIPLE MYELOMA PANEL, SERUM
Albumin SerPl Elph-Mcnc: 3.5 g/dL (ref 2.9–4.4)
Albumin/Glob SerPl: 1.6 (ref 0.7–1.7)
Alpha 1: 0.3 g/dL (ref 0.0–0.4)
Alpha2 Glob SerPl Elph-Mcnc: 0.6 g/dL (ref 0.4–1.0)
B-Globulin SerPl Elph-Mcnc: 0.9 g/dL (ref 0.7–1.3)
Gamma Glob SerPl Elph-Mcnc: 0.4 g/dL (ref 0.4–1.8)
Globulin, Total: 2.2 g/dL (ref 2.2–3.9)
IgA: 22 mg/dL — ABNORMAL LOW (ref 61–437)
IgG (Immunoglobin G), Serum: 446 mg/dL — ABNORMAL LOW (ref 603–1613)
IgM (Immunoglobulin M), Srm: 9 mg/dL — ABNORMAL LOW (ref 15–143)
Total Protein ELP: 5.7 g/dL — ABNORMAL LOW (ref 6.0–8.5)

## 2022-06-09 LAB — LACTATE DEHYDROGENASE: LDH: 225 U/L — ABNORMAL HIGH (ref 98–192)

## 2022-06-09 MED ORDER — SODIUM CHLORIDE 0.9 % IV SOLN
225.0000 mg/m2 | Freq: Once | INTRAVENOUS | Status: AC
  Administered 2022-06-09: 520 mg via INTRAVENOUS
  Filled 2022-06-09: qty 26

## 2022-06-09 MED ORDER — SODIUM CHLORIDE 0.9 % IV SOLN: Freq: Once | INTRAVENOUS | Status: AC

## 2022-06-09 MED ORDER — BORTEZOMIB CHEMO SQ INJECTION 3.5 MG (2.5MG/ML)
1.5000 mg/m2 | Freq: Once | INTRAMUSCULAR | Status: AC
  Administered 2022-06-09: 3.5 mg via SUBCUTANEOUS
  Filled 2022-06-09: qty 1.4

## 2022-06-09 MED ORDER — SODIUM CHLORIDE 0.9 % IV SOLN
40.0000 mg | Freq: Once | INTRAVENOUS | Status: DC
  Filled 2022-06-09: qty 4

## 2022-06-09 MED ORDER — PALONOSETRON HCL INJECTION 0.25 MG/5ML
0.2500 mg | Freq: Once | INTRAVENOUS | Status: AC
  Administered 2022-06-09: 0.25 mg via INTRAVENOUS
  Filled 2022-06-09: qty 5

## 2022-06-09 NOTE — Patient Instructions (Signed)
[facility]  Discharge Instructions: Thank you for choosing [facility] to provide your oncology and hematology care.   If you have a lab appointment with the [facility], please go directly to the [facility] and check in at the registration area.   Wear comfortable clothing and clothing appropriate for easy access to any Portacath or PICC line.   We strive to give you quality time with your provider. You may need to reschedule your appointment if you arrive late (15 or more minutes).  Arriving late affects you and other patients whose appointments are after yours.  Also, if you miss three or more appointments without notifying the office, you may be dismissed from the clinic at the provider's discretion.      For prescription refill requests, have your pharmacy contact our office and allow 72 hours for refills to be completed.    Today you received the following chemotherapy and/or immunotherapy agents: Bortezomib (Velcade) and Cyclophosphamide (Cytoxan).   To help prevent nausea and vomiting after your treatment, we encourage you to take your nausea medication as directed.  BELOW ARE SYMPTOMS THAT SHOULD BE REPORTED IMMEDIATELY: *FEVER GREATER THAN 100.4 F (38 C) OR HIGHER *CHILLS OR SWEATING *NAUSEA AND VOMITING THAT IS NOT CONTROLLED WITH YOUR NAUSEA MEDICATION *UNUSUAL SHORTNESS OF BREATH *UNUSUAL BRUISING OR BLEEDING *URINARY PROBLEMS (pain or burning when urinating, or frequent urination) *BOWEL PROBLEMS (unusual diarrhea, constipation, pain near the anus) TENDERNESS IN MOUTH AND THROAT WITH OR WITHOUT PRESENCE OF ULCERS (sore throat, sores in mouth, or a toothache) UNUSUAL RASH, SWELLING OR PAIN  UNUSUAL VAGINAL DISCHARGE OR ITCHING   Items with * indicate a potential emergency and should be followed up as soon as possible or go to the Emergency Department if any problems should occur.  Please show the CHEMOTHERAPY ALERT CARD or  IMMUNOTHERAPY ALERT CARD at check-in to the Emergency Department and triage nurse.  Should you have questions after your visit or need to cancel or reschedule your appointment, please contact [facility]  Dept: [phone]  and follow the prompts.  Office hours are 8:00 a.m. to 4:30 p.m. Monday - Friday. Please note that voicemails left after 4:00 p.m. may not be returned until the following business day.  We are closed weekends and major holidays. You have access to a nurse at all times for urgent questions. Please call the main number to the clinic Dept: [phone] and follow the prompts.   For any non-urgent questions, you may also contact your provider using MyChart. We now offer e-Visits for anyone 69 and older to request care online for non-urgent symptoms. For details visit [email or web address].   Also download the MyChart app! Go to the app store, search "MyChart", open the app, select [facility], and log in with your MyChart username and password.  Masks are optional in the cancer centers. If you would like for your care team to wear a mask while they are taking care of you, please let them know. You may have one support person who is at least 77 years old accompany you for your appointments. Bortezomib Injection What is this medication? BORTEZOMIB (bor TEZ oh mib) treats lymphoma. It may also be used to treat multiple myeloma, a type of bone marrow cancer. It works by blocking a protein that causes cancer cells to grow and multiply. This helps to slow or stop the spread of cancer cells. This medicine may be used for other purposes;  ask your health care provider or pharmacist if you have questions. COMMON BRAND NAME(S): Velcade What should I tell my care team before I take this medication? They need to know if you have any of these conditions: Dehydration Diabetes Heart disease Liver disease Tingling of the fingers or toes or other nerve disorder An  unusual or allergic reaction to bortezomib, other medications, foods, dyes, or preservatives If you or your partner are pregnant or trying to get pregnant Breastfeeding How should I use this medication? This medication is injected into a vein or under the skin. It is given by your care team in a hospital or clinic setting. Talk to your care team about the use of this medication in children. Special care may be needed. Overdosage: If you think you have taken too much of this medicine contact a poison control center or emergency room at once. NOTE: This medicine is only for you. Do not share this medicine with others. What if I miss a dose? Keep appointments for follow-up doses. It is important not to miss your dose. Call your care team if you are unable to keep an appointment. What may interact with this medication? Ketoconazole Rifampin This list may not describe all possible interactions. Give your health care provider a list of all the medicines, herbs, non-prescription drugs, or dietary supplements you use. Also tell them if you smoke, drink alcohol, or use illegal drugs. Some items may interact with your medicine. What should I watch for while using this medication? Your condition will be monitored carefully while you are receiving this medication. You may need blood work while taking this medication. This medication may affect your coordination, reaction time, or judgment. Do not drive or operate machinery until you know how this medication affects you. Sit up or stand slowly to reduce the risk of dizzy or fainting spells. Drinking alcohol with this medication can increase the risk of these side effects. This medication may increase your risk of getting an infection. Call your care team for advice if you get a fever, chills, sore throat, or other symptoms of a cold or flu. Do not treat yourself. Try to avoid being around people who are sick. Check with your care team if you have severe  diarrhea, nausea, and vomiting, or if you sweat a lot. The loss of too much body fluid may make it dangerous for you to take this medication. Talk to your care team if you may be pregnant. Serious birth defects can occur if you take this medication during pregnancy and for 7 months after the last dose. You will need a negative pregnancy test before starting this medication. Contraception is recommended while taking this medication and for 7 months after the last dose. Your care team can help you find the option that works for you. If your partner can get pregnant, use a condom during sex while taking this medication and for 4 months after the last dose. Do not breastfeed while taking this medication and for 2 months after the last dose. This medication may cause infertility. Talk to your care team if you are concerned about your fertility. What side effects may I notice from receiving this medication? Side effects that you should report to your care team as soon as possible: Allergic reactions--skin rash, itching, hives, swelling of the face, lips, tongue, or throat Bleeding--bloody or black, tar-like stools, vomiting blood or brown material that looks like coffee grounds, red or dark brown urine, small red or purple spots on   skin, unusual bruising or bleeding Bleeding in the brain--severe headache, stiff neck, confusion, dizziness, change in vision, numbness or weakness of the face, arm, or leg, trouble speaking, trouble walking, vomiting Bowel blockage--stomach cramping, unable to have a bowel movement or pass gas, loss of appetite, vomiting Heart failure--shortness of breath, swelling of the ankles, feet, or hands, sudden weight gain, unusual weakness or fatigue Infection--fever, chills, cough, sore throat, wounds that don't heal, pain or trouble when passing urine, general feeling of discomfort or being unwell Liver injury--right upper belly pain, loss of appetite, nausea, light-colored stool, dark  yellow or brown urine, yellowing skin or eyes, unusual weakness or fatigue Low blood pressure--dizziness, feeling faint or lightheaded, blurry vision Lung injury--shortness of breath or trouble breathing, cough, spitting up blood, chest pain, fever Pain, tingling, or numbness in the hands or feet Severe or prolonged diarrhea Stomach pain, bloody diarrhea, pale skin, unusual weakness or fatigue, decrease in the amount of urine, which may be signs of hemolytic uremic syndrome Sudden and severe headache, confusion, change in vision, seizures, which may be signs of posterior reversible encephalopathy syndrome (PRES) TTP--purple spots on the skin or inside the mouth, pale skin, yellowing skin or eyes, unusual weakness or fatigue, fever, fast or irregular heartbeat, confusion, change in vision, trouble speaking, trouble walking Tumor lysis syndrome (TLS)--nausea, vomiting, diarrhea, decrease in the amount of urine, dark urine, unusual weakness or fatigue, confusion, muscle pain or cramps, fast or irregular heartbeat, joint pain Side effects that usually do not require medical attention (report to your care team if they continue or are bothersome): Constipation Diarrhea Fatigue Loss of appetite Nausea This list may not describe all possible side effects. Call your doctor for medical advice about side effects. You may report side effects to FDA at 1-800-FDA-1088. Where should I keep my medication? This medication is given in a hospital or clinic. It will not be stored at home. NOTE: This sheet is a summary. It may not cover all possible information. If you have questions about this medicine, talk to your doctor, pharmacist, or health care provider.  2023 Elsevier/Gold Standard (2022-01-22 00:00:00) Cyclophosphamide Injection What is this medication? CYCLOPHOSPHAMIDE (sye kloe FOSS fa mide) treats some types of cancer. It works by slowing down the growth of cancer cells. This medicine may be used for  other purposes; ask your health care provider or pharmacist if you have questions. COMMON BRAND NAME(S): Cyclophosphamide, Cytoxan, Neosar What should I tell my care team before I take this medication? They need to know if you have any of these conditions: Heart disease Irregular heartbeat or rhythm Infection Kidney problems Liver disease Low blood cell levels (white cells, platelets, or red blood cells) Lung disease Previous radiation Trouble passing urine An unusual or allergic reaction to cyclophosphamide, other medications, foods, dyes, or preservatives Pregnant or trying to get pregnant Breast-feeding How should I use this medication? This medication is injected into a vein. It is given by your care team in a hospital or clinic setting. Talk to your care team about the use of this medication in children. Special care may be needed. Overdosage: If you think you have taken too much of this medicine contact a poison control center or emergency room at once. NOTE: This medicine is only for you. Do not share this medicine with others. What if I miss a dose? Keep appointments for follow-up doses. It is important not to miss your dose. Call your care team if you are unable to keep an appointment. What  may interact with this medication? Amphotericin B Amiodarone Azathioprine Certain antivirals for HIV or hepatitis Certain medications for blood pressure, such as enalapril, lisinopril, quinapril Cyclosporine Diuretics Etanercept Indomethacin Medications that relax muscles Metronidazole Natalizumab Tamoxifen Warfarin This list may not describe all possible interactions. Give your health care provider a list of all the medicines, herbs, non-prescription drugs, or dietary supplements you use. Also tell them if you smoke, drink alcohol, or use illegal drugs. Some items may interact with your medicine. What should I watch for while using this medication? This medication may make you  feel generally unwell. This is not uncommon as chemotherapy can affect healthy cells as well as cancer cells. Report any side effects. Continue your course of treatment even though you feel ill unless your care team tells you to stop. You may need blood work while you are taking this medication. This medication may increase your risk of getting an infection. Call your care team for advice if you get a fever, chills, sore throat, or other symptoms of a cold or flu. Do not treat yourself. Try to avoid being around people who are sick. Avoid taking medications that contain aspirin, acetaminophen, ibuprofen, naproxen, or ketoprofen unless instructed by your care team. These medications may hide a fever. Be careful brushing or flossing your teeth or using a toothpick because you may get an infection or bleed more easily. If you have any dental work done, tell your dentist you are receiving this medication. Drink water or other fluids as directed. Urinate often, even at night. Some products may contain alcohol. Ask your care team if this medication contains alcohol. Be sure to tell all care teams you are taking this medicine. Certain medicines, like metronidazole and disulfiram, can cause an unpleasant reaction when taken with alcohol. The reaction includes flushing, headache, nausea, vomiting, sweating, and increased thirst. The reaction can last from 30 minutes to several hours. Talk to your care team if you wish to become pregnant or think you might be pregnant. This medication can cause serious birth defects if taken during pregnancy and for 1 year after the last dose. A negative pregnancy test is required before starting this medication. A reliable form of contraception is recommended while taking this medication and for 1 year after the last dose. Talk to your care team about reliable forms of contraception. Do not father a child while taking this medication and for 4 months after the last dose. Use a condom  during this time period. Do not breast-feed while taking this medication or for 1 week after the last dose. This medication may cause infertility. Talk to your care team if you are concerned about your fertility. Talk to your care team about your risk of cancer. You may be more at risk for certain types of cancer if you take this medication. What side effects may I notice from receiving this medication? Side effects that you should report to your care team as soon as possible: Allergic reactions--skin rash, itching, hives, swelling of the face, lips, tongue, or throat Dry cough, shortness of breath or trouble breathing Heart failure--shortness of breath, swelling of the ankles, feet, or hands, sudden weight gain, unusual weakness or fatigue Heart muscle inflammation--unusual weakness or fatigue, shortness of breath, chest pain, fast or irregular heartbeat, dizziness, swelling of the ankles, feet, or hands Heart rhythm changes--fast or irregular heartbeat, dizziness, feeling faint or lightheaded, chest pain, trouble breathing Infection--fever, chills, cough, sore throat, wounds that don't heal, pain or trouble when passing  urine, general feeling of discomfort or being unwell Kidney injury--decrease in the amount of urine, swelling of the ankles, hands, or feet Liver injury--right upper belly pain, loss of appetite, nausea, light-colored stool, dark yellow or brown urine, yellowing skin or eyes, unusual weakness or fatigue Low red blood cell level--unusual weakness or fatigue, dizziness, headache, trouble breathing Low sodium level--muscle weakness, fatigue, dizziness, headache, confusion Red or dark brown urine Unusual bruising or bleeding Side effects that usually do not require medical attention (report to your care team if they continue or are bothersome): Hair loss Irregular menstrual cycles or spotting Loss of appetite Nausea Pain, redness, or swelling with sores inside the mouth or  throat Vomiting This list may not describe all possible side effects. Call your doctor for medical advice about side effects. You may report side effects to FDA at 1-800-FDA-1088. Where should I keep my medication? This medication is given in a hospital or clinic. It will not be stored at home. NOTE: This sheet is a summary. It may not cover all possible information. If you have questions about this medicine, talk to your doctor, pharmacist, or health care provider.  2023 Elsevier/Gold Standard (2021-12-04 00:00:00)

## 2022-06-09 NOTE — Progress Notes (Signed)
Per Dr. [name], ok for treatment with ANC 1.1 K/uL, serum creatine 1.68 mg/dL, and elevated blood pressure. Pt. denies complaints of chest pain, dizziness, no SOB noted. Pt. States he took Decadron 40 mg po at home this morning, per pharmacy no IV Decadron to be given.

## 2022-06-13 ENCOUNTER — Ambulatory Visit: Payer: No Typology Code available for payment source | Admitting: Cardiovascular Disease

## 2022-06-16 ENCOUNTER — Inpatient Hospital Stay: Payer: No Typology Code available for payment source

## 2022-06-16 DIAGNOSIS — Z5112 Encounter for antineoplastic immunotherapy: Secondary | ICD-10-CM | POA: Diagnosis not present

## 2022-06-16 DIAGNOSIS — C9000 Multiple myeloma not having achieved remission: Secondary | ICD-10-CM

## 2022-06-16 LAB — CBC WITH DIFFERENTIAL (CANCER CENTER ONLY)
Abs Immature Granulocytes: 0.01 10*3/uL (ref 0.00–0.07)
Basophils Absolute: 0 10*3/uL (ref 0.0–0.1)
Basophils Relative: 2 %
Eosinophils Absolute: 0.1 10*3/uL (ref 0.0–0.5)
Eosinophils Relative: 6 %
HCT: 34.9 % — ABNORMAL LOW (ref 39.0–52.0)
Hemoglobin: 11.4 g/dL — ABNORMAL LOW (ref 13.0–17.0)
Immature Granulocytes: 1 %
Lymphocytes Relative: 52 %
Lymphs Abs: 1.1 10*3/uL (ref 0.7–4.0)
MCH: 33.3 pg (ref 26.0–34.0)
MCHC: 32.7 g/dL (ref 30.0–36.0)
MCV: 102 fL — ABNORMAL HIGH (ref 80.0–100.0)
Monocytes Absolute: 0.7 10*3/uL (ref 0.1–1.0)
Monocytes Relative: 34 %
Neutro Abs: 0.1 10*3/uL — CL (ref 1.7–7.7)
Neutrophils Relative %: 5 %
Platelet Count: 119 10*3/uL — ABNORMAL LOW (ref 150–400)
RBC: 3.42 MIL/uL — ABNORMAL LOW (ref 4.22–5.81)
RDW: 16.4 % — ABNORMAL HIGH (ref 11.5–15.5)
Smear Review: NORMAL
WBC Count: 2 10*3/uL — ABNORMAL LOW (ref 4.0–10.5)
nRBC: 0 % (ref 0.0–0.2)

## 2022-06-16 LAB — CMP (CANCER CENTER ONLY)
ALT: 14 U/L (ref 0–44)
AST: 22 U/L (ref 15–41)
Albumin: 4.3 g/dL (ref 3.5–5.0)
Alkaline Phosphatase: 61 U/L (ref 38–126)
Anion gap: 7 (ref 5–15)
BUN: 17 mg/dL (ref 8–23)
CO2: 25 mmol/L (ref 22–32)
Calcium: 8.9 mg/dL (ref 8.9–10.3)
Chloride: 107 mmol/L (ref 98–111)
Creatinine: 1.79 mg/dL — ABNORMAL HIGH (ref 0.61–1.24)
GFR, Estimated: 39 mL/min — ABNORMAL LOW (ref >60)
Glucose, Bld: 138 mg/dL — ABNORMAL HIGH (ref 70–99)
Potassium: 3.8 mmol/L (ref 3.5–5.1)
Sodium: 139 mmol/L (ref 135–145)
Total Bilirubin: 0.6 mg/dL (ref 0.3–1.2)
Total Protein: 6.6 g/dL (ref 6.5–8.1)

## 2022-06-16 LAB — LACTATE DEHYDROGENASE: LDH: 236 U/L — ABNORMAL HIGH (ref 98–192)

## 2022-06-20 ENCOUNTER — Telehealth: Payer: Self-pay | Admitting: Cardiovascular Disease

## 2022-06-20 IMAGING — CT CT BIOPSY BONE MARROW
1 of 2 series · 15 of 28 positions shown, 19 images · non-contrast
Comparison: none

CLINICAL DATA: History of monoclonal gammopathy and concern for
potential progression to multiple myeloma. Bone marrow biopsy
requested for further evaluation.

[Series 2: i-spiral 5.0 br40 · axial · 0.90mm/px · z∈[+1282,+1359]mm · 15 of 26 slices shown, 19 images]
[im 2/26  mediastinal]
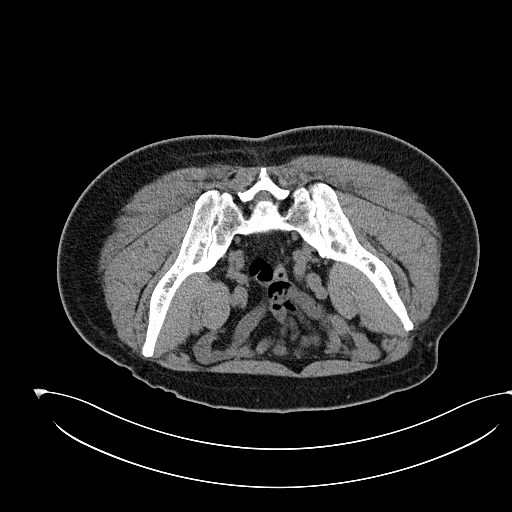
[im 2/26  lung]
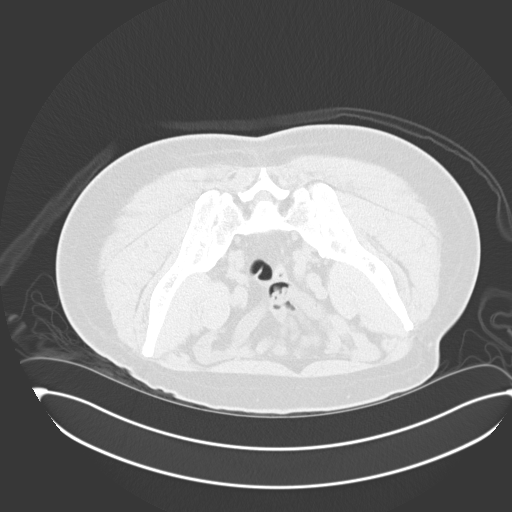
[im 4/26  lung]
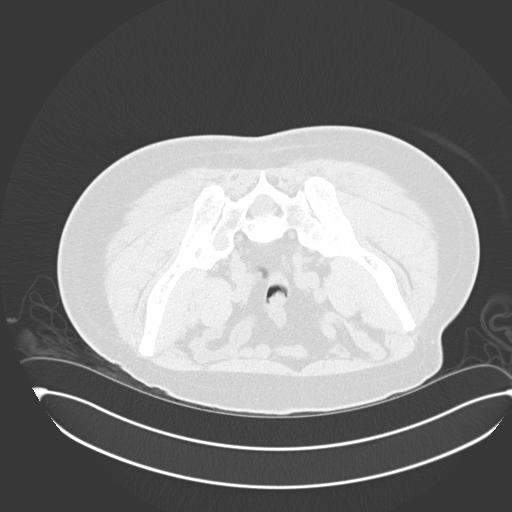
[im 5/26  lung]
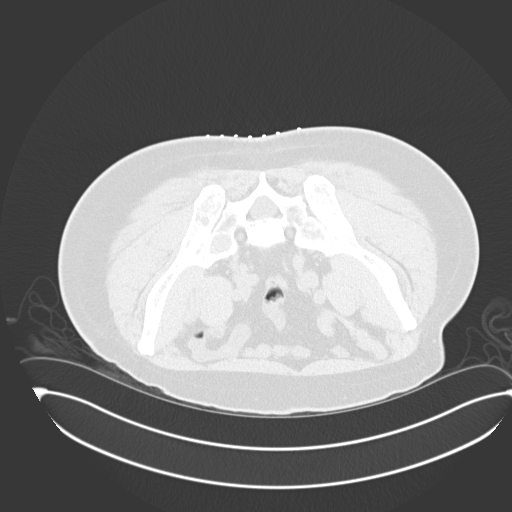
[im 6/26  lung]
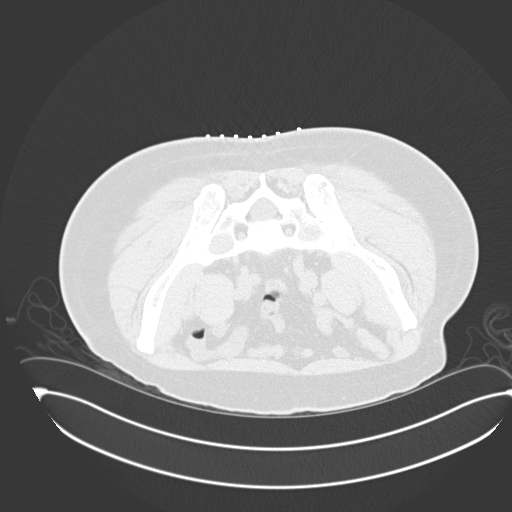
[im 8/26  mediastinal]
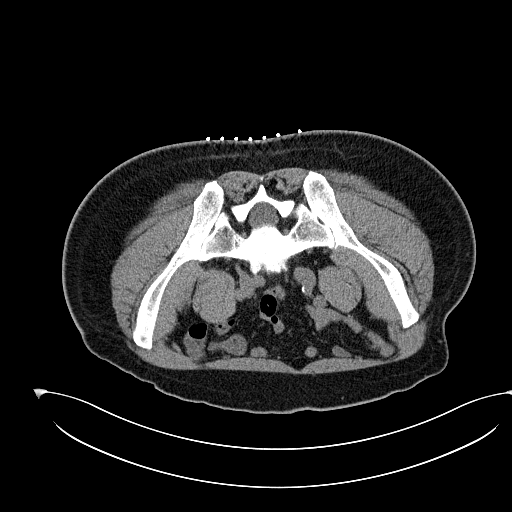
[im 8/26  lung]
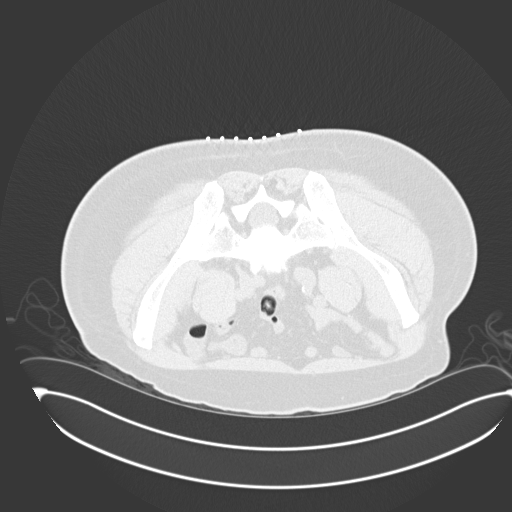
[im 10/26  lung]
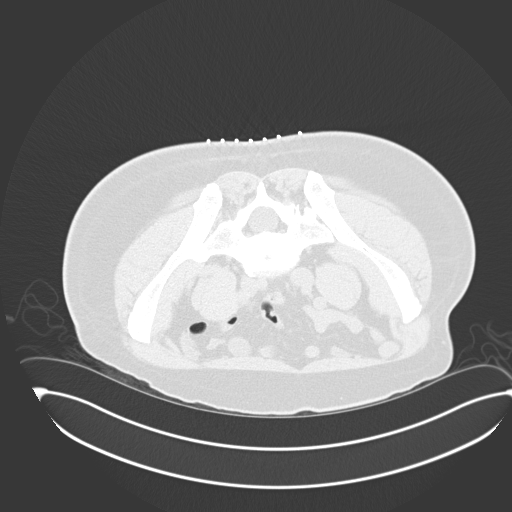
[im 12/26  lung]
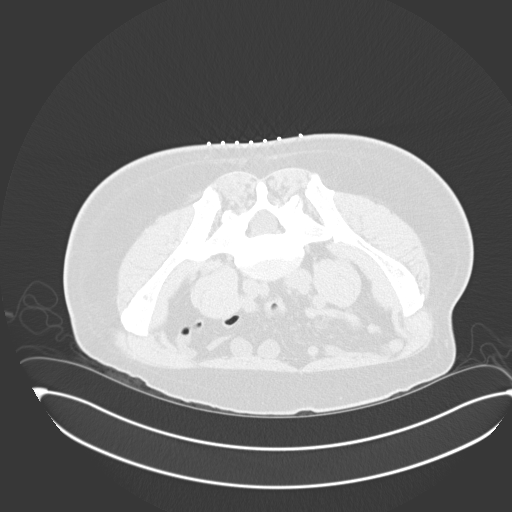
[im 13/26  lung]
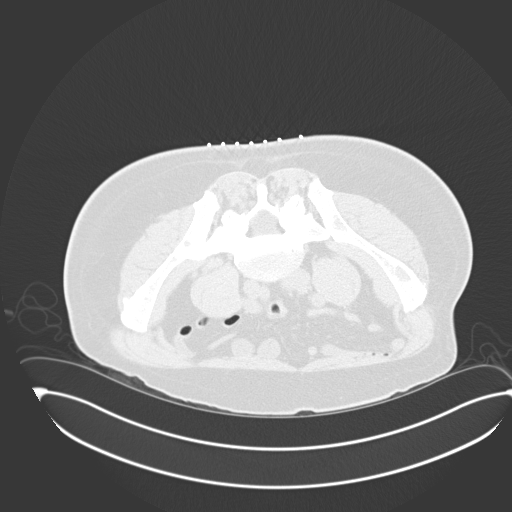
[im 14/26  mediastinal]
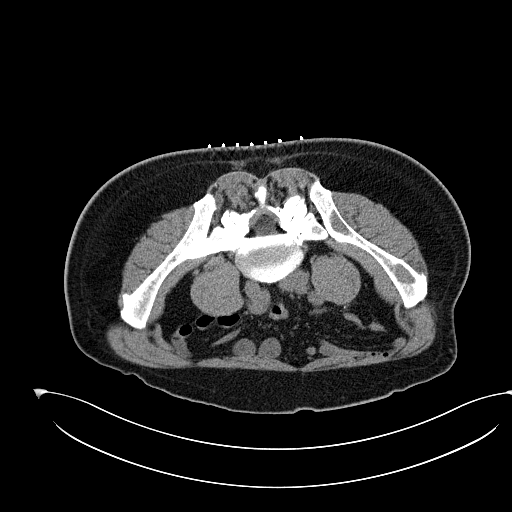
[im 14/26  lung]
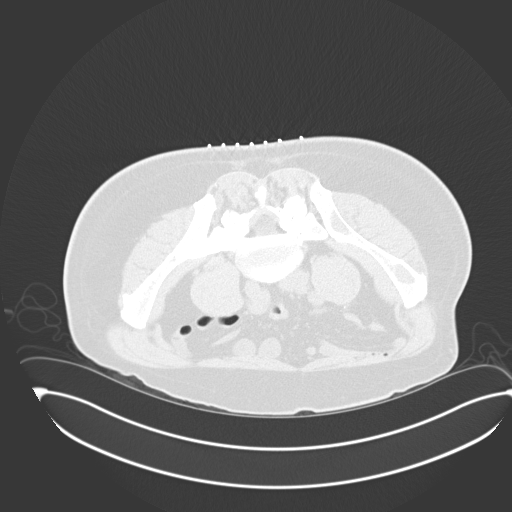
[im 16/26  lung]
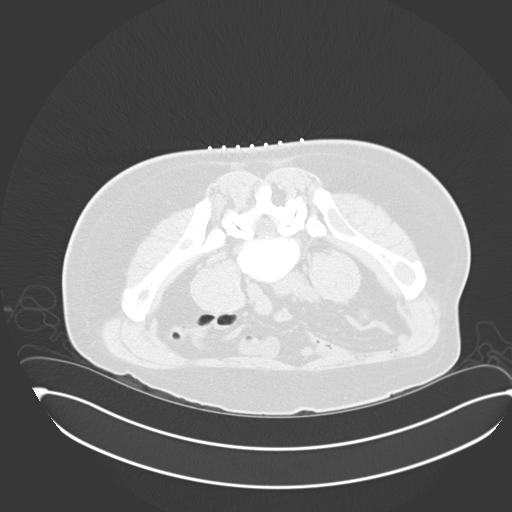
[im 18/26  lung]
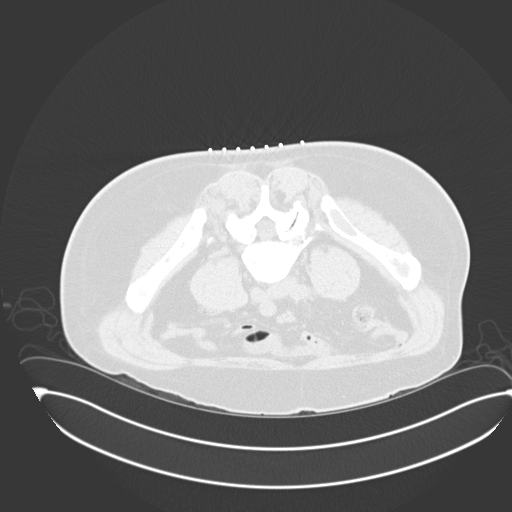
[im 20/26  lung]
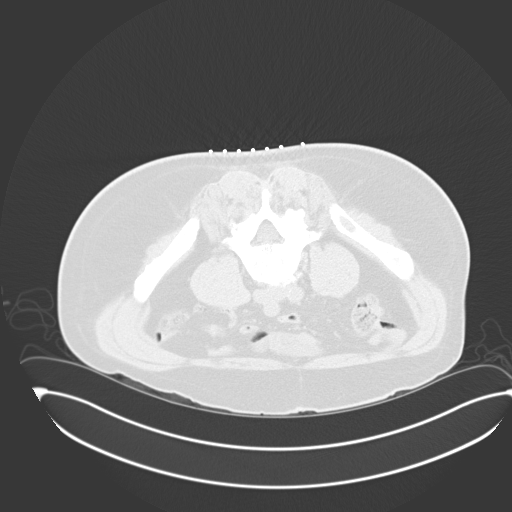
[im 21/26  mediastinal]
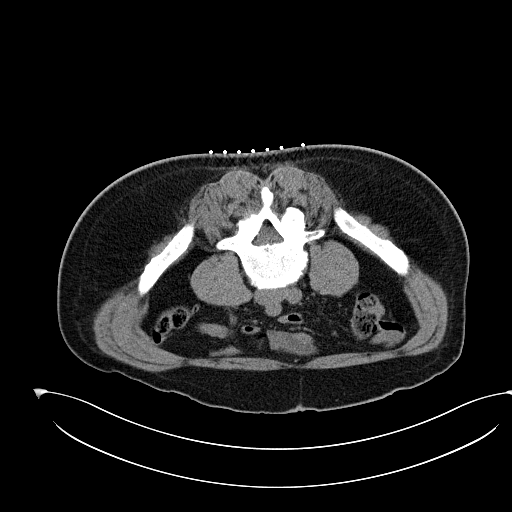
[im 21/26  lung]
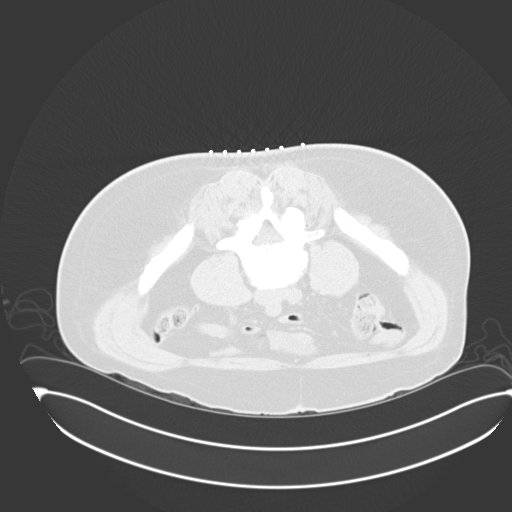
[im 22/26  lung]
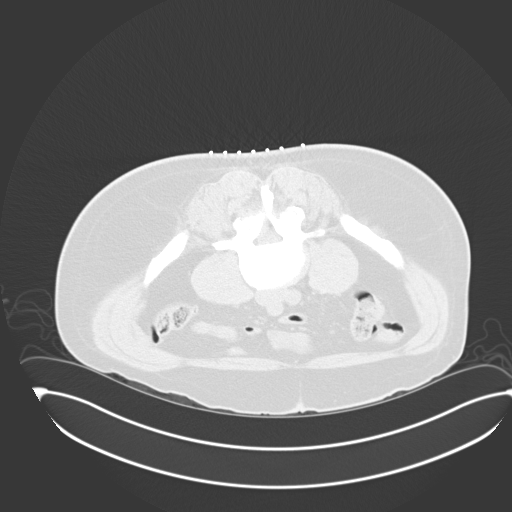
[im 24/26  lung]
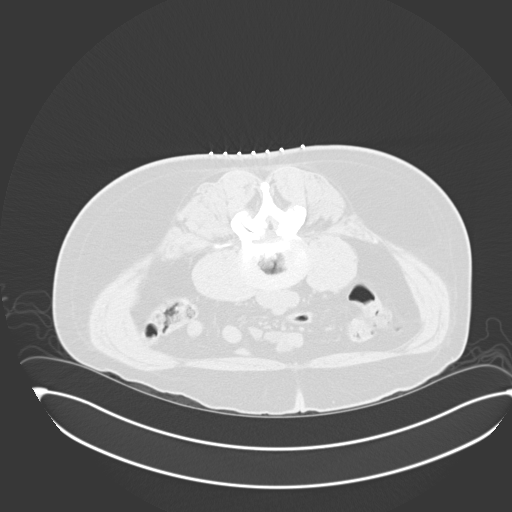

[15 of 28 positions shown; findings below may reference images not displayed]

EXAM:
CT GUIDED BONE MARROW ASPIRATION AND BIOPSY

ANESTHESIA/SEDATION:
Moderate (conscious) sedation was employed during this procedure. A
total of Versed 2.0 mg and Fentanyl 100 mcg was administered
intravenously by radiology nursing.

Moderate Sedation Time: 10 minutes. The patient's level of
consciousness and vital signs were monitored continuously by
radiology nursing throughout the procedure under my direct
supervision.

PROCEDURE:
The procedure risks, benefits, and alternatives were explained to
the patient. Questions regarding the procedure were encouraged and
answered. The patient understands and consents to the procedure. A
time out was performed prior to initiating the procedure.

The right gluteal region was prepped with chlorhexidine. Sterile
gown and sterile gloves were used for the procedure. Local
anesthesia was provided with 1% Lidocaine.

Under CT guidance, an 11 gauge On Control bone cutting needle was
advanced from a posterior approach into the right iliac bone. Needle
positioning was confirmed with CT. Initial non heparinized and
heparinized aspirate samples were obtained of bone marrow. Core
biopsy was performed via the On Control drill needle.

COMPLICATIONS:
None
FINDINGS: Inspection of initial aspirate did reveal visible particles. Intact
core biopsy sample was obtained.
IMPRESSION: CT guided bone marrow biopsy of right posterior iliac bone with both
aspirate and core samples obtained.

## 2022-06-20 IMAGING — CT CT BIOPSY
1 of 2 series · 15 of 28 positions shown, 19 images · non-contrast
Comparison: none

CLINICAL DATA: History of monoclonal gammopathy and concern for
potential progression to multiple myeloma. Bone marrow biopsy
requested for further evaluation.

[Series 507: i-spiral 5.0 br40 · axial · 0.90mm/px · z∈[+1282,+1359]mm · 15 of 26 slices shown, 19 images]
[im 2/26  mediastinal]
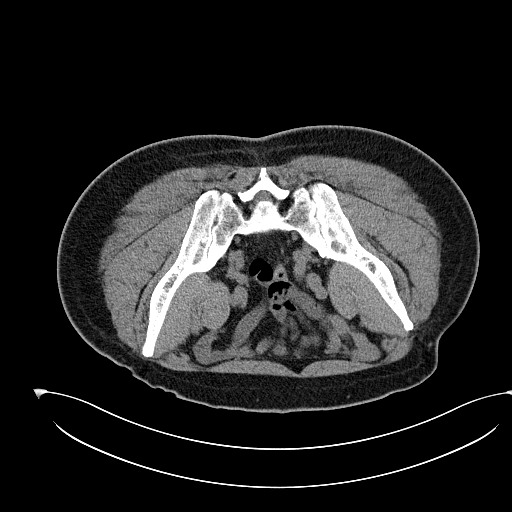
[im 2/26  lung]
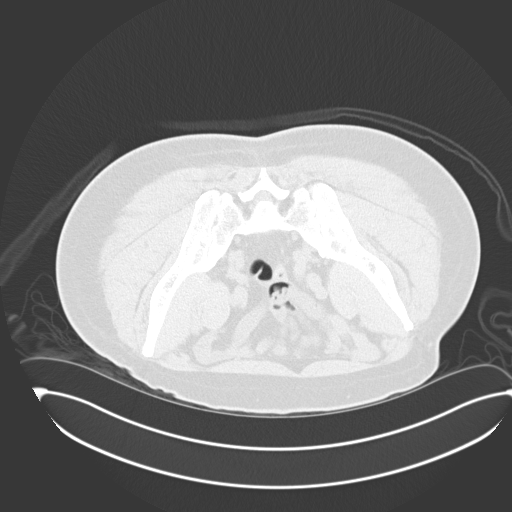
[im 4/26  lung]
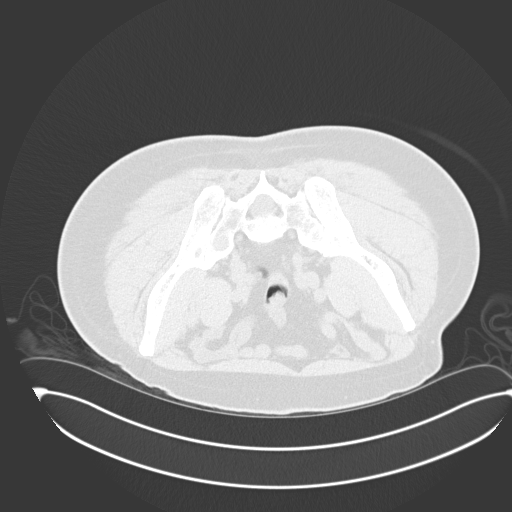
[im 5/26  lung]
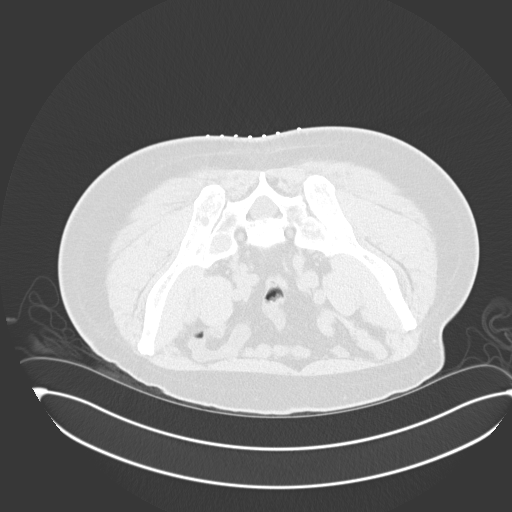
[im 6/26  lung]
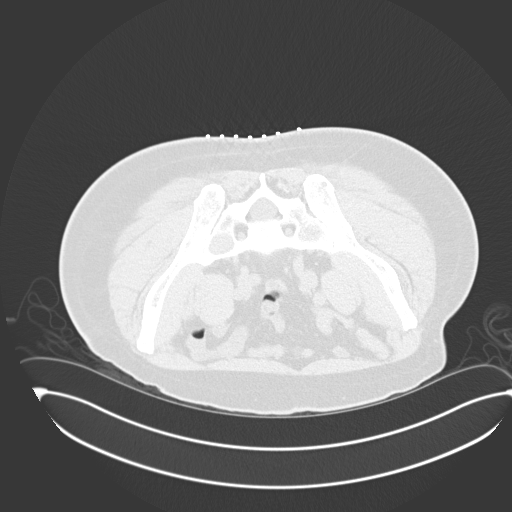
[im 8/26  mediastinal]
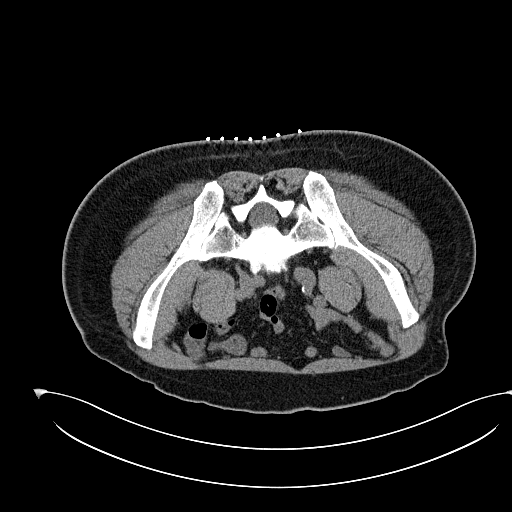
[im 8/26  lung]
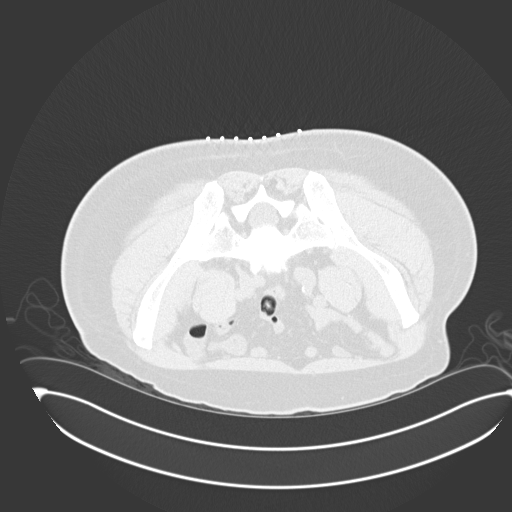
[im 10/26  lung]
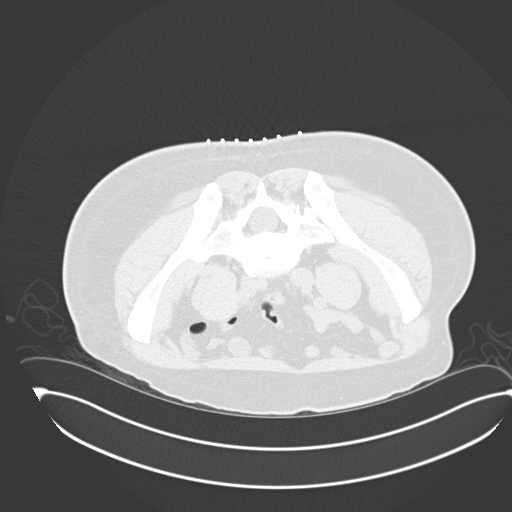
[im 12/26  lung]
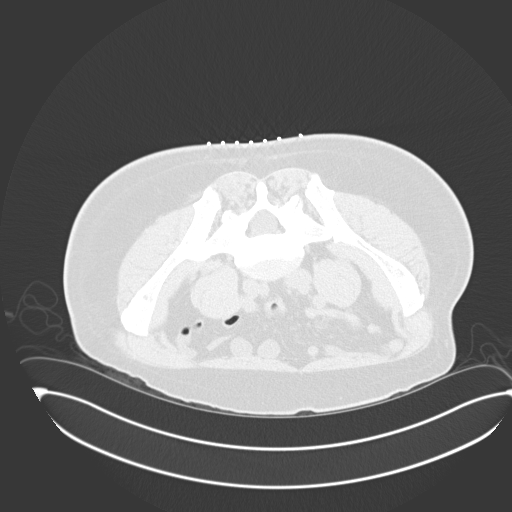
[im 13/26  lung]
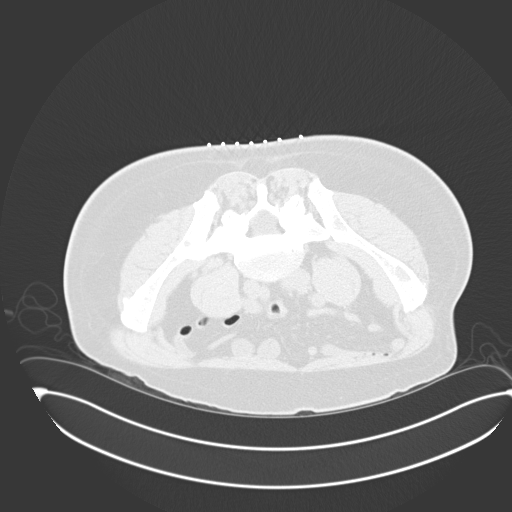
[im 14/26  mediastinal]
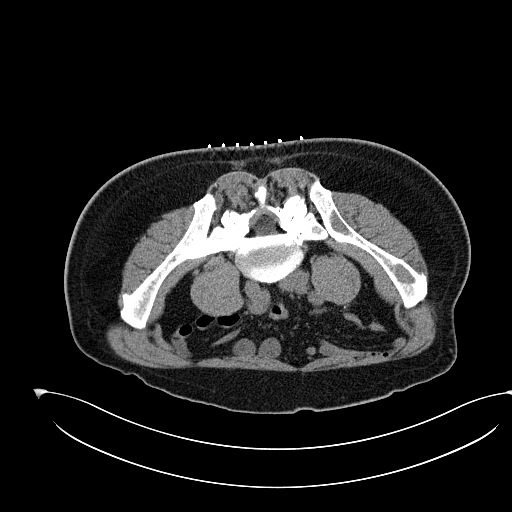
[im 14/26  lung]
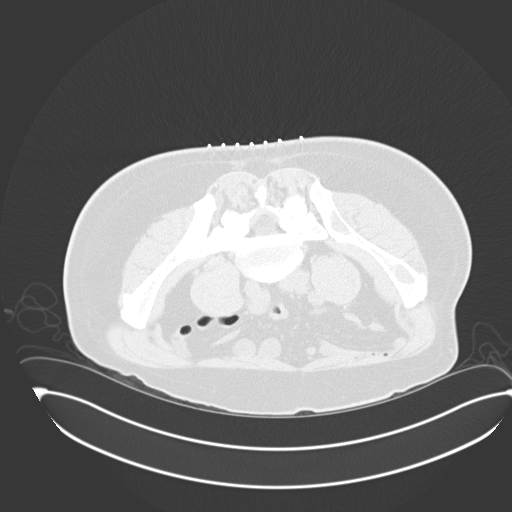
[im 16/26  lung]
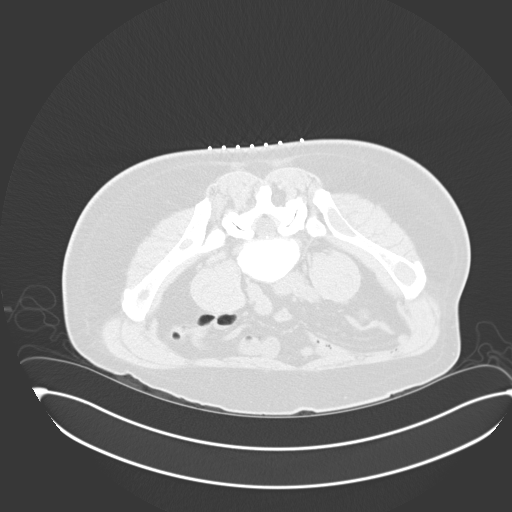
[im 18/26  lung]
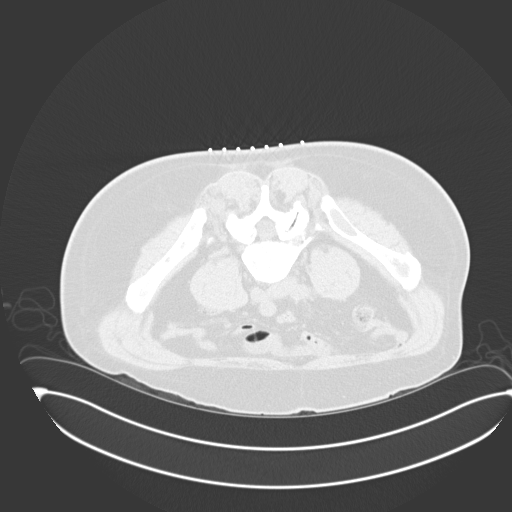
[im 20/26  lung]
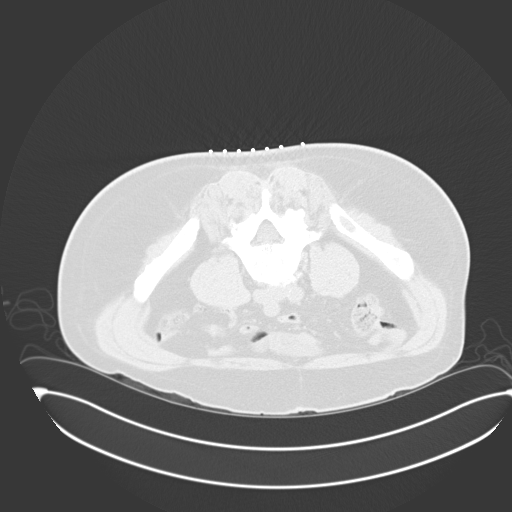
[im 21/26  mediastinal]
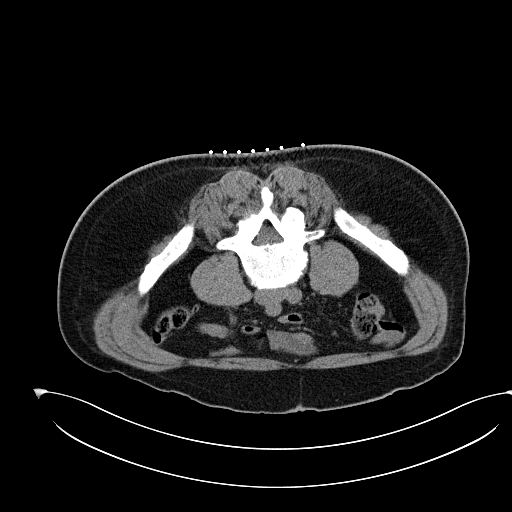
[im 21/26  lung]
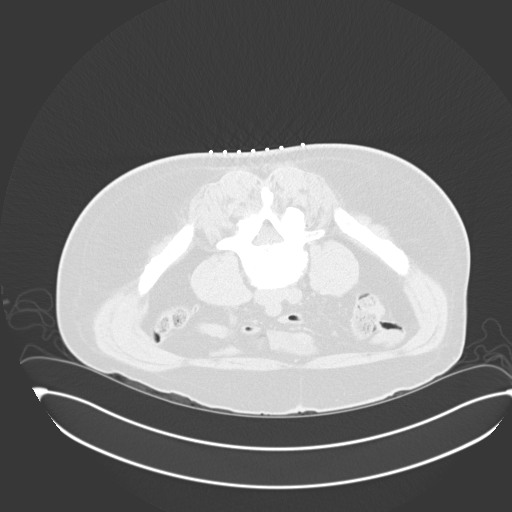
[im 22/26  lung]
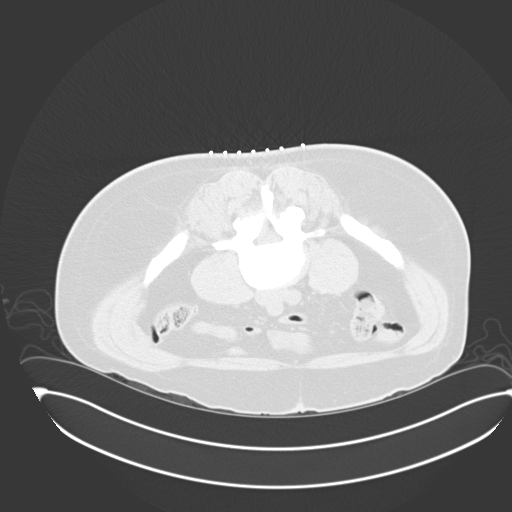
[im 24/26  lung]
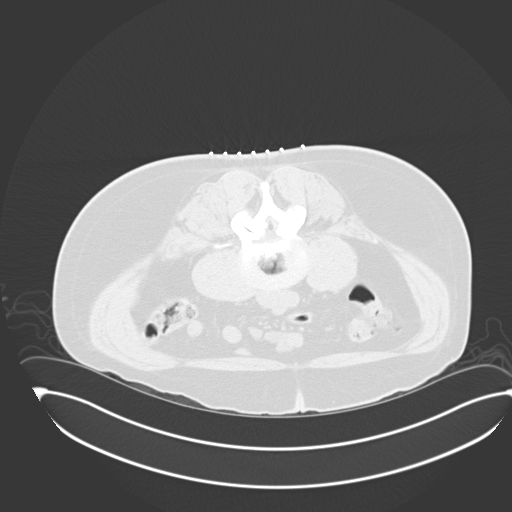

[15 of 28 positions shown; findings below may reference images not displayed]

EXAM:
CT GUIDED BONE MARROW ASPIRATION AND BIOPSY

ANESTHESIA/SEDATION:
Moderate (conscious) sedation was employed during this procedure. A
total of Versed 2.0 mg and Fentanyl 100 mcg was administered
intravenously by radiology nursing.

Moderate Sedation Time: 10 minutes. The patient's level of
consciousness and vital signs were monitored continuously by
radiology nursing throughout the procedure under my direct
supervision.

PROCEDURE:
The procedure risks, benefits, and alternatives were explained to
the patient. Questions regarding the procedure were encouraged and
answered. The patient understands and consents to the procedure. A
time out was performed prior to initiating the procedure.

The right gluteal region was prepped with chlorhexidine. Sterile
gown and sterile gloves were used for the procedure. Local
anesthesia was provided with 1% Lidocaine.

Under CT guidance, an 11 gauge On Control bone cutting needle was
advanced from a posterior approach into the right iliac bone. Needle
positioning was confirmed with CT. Initial non heparinized and
heparinized aspirate samples were obtained of bone marrow. Core
biopsy was performed via the On Control drill needle.

COMPLICATIONS:
None
FINDINGS: Inspection of initial aspirate did reveal visible particles. Intact
core biopsy sample was obtained.
IMPRESSION: CT guided bone marrow biopsy of right posterior iliac bone with both
aspirate and core samples obtained.

## 2022-06-20 NOTE — Telephone Encounter (Signed)
Spoke with patient who reports he got approval letter from the [place] for cardioversion. He would like Dr. Loletha Grayer to do procedure - next reader B day is 10/24. Unsure if patient will need another visit/updated H&P as last visit was 05/20/22.   Will send to MD/RN

## 2022-06-20 NOTE — Telephone Encounter (Signed)
You  Croitoru, Mihai, MD 3 hours ago (1:19 PM)    Can't scheduled w/you on 10/24 - outpatient cases full. Can you do on rounding week or next read B is 11/16. Or will see if he is OK with another provider

## 2022-06-20 NOTE — Telephone Encounter (Signed)
  Pt said, he got permission from the [place] to schedule his procedure

## 2022-06-20 NOTE — Telephone Encounter (Signed)
Per MD, ok to scheduled on 10/24 - he will update H&P at procedure.   Called scheduling -- cannot schedule as 6 outpatient cases are already scheduled

## 2022-06-23 ENCOUNTER — Inpatient Hospital Stay (HOSPITAL_BASED_OUTPATIENT_CLINIC_OR_DEPARTMENT_OTHER): Payer: No Typology Code available for payment source

## 2022-06-23 ENCOUNTER — Inpatient Hospital Stay: Payer: No Typology Code available for payment source

## 2022-06-23 ENCOUNTER — Encounter: Payer: Self-pay | Admitting: *Deleted

## 2022-06-23 ENCOUNTER — Other Ambulatory Visit: Payer: Self-pay

## 2022-06-23 VITALS — BP 153/89 | HR 62 | Temp 98.4°F | Resp 17 | Wt 221.2 lb

## 2022-06-23 DIAGNOSIS — Z5112 Encounter for antineoplastic immunotherapy: Secondary | ICD-10-CM | POA: Diagnosis not present

## 2022-06-23 DIAGNOSIS — C9000 Multiple myeloma not having achieved remission: Secondary | ICD-10-CM

## 2022-06-23 LAB — CBC WITH DIFFERENTIAL (CANCER CENTER ONLY)
Abs Immature Granulocytes: 0.02 10*3/uL (ref 0.00–0.07)
Basophils Absolute: 0 10*3/uL (ref 0.0–0.1)
Basophils Relative: 0 %
Eosinophils Absolute: 0.2 10*3/uL (ref 0.0–0.5)
Eosinophils Relative: 3 %
HCT: 37.3 % — ABNORMAL LOW (ref 39.0–52.0)
Hemoglobin: 12.2 g/dL — ABNORMAL LOW (ref 13.0–17.0)
Immature Granulocytes: 0 %
Lymphocytes Relative: 14 %
Lymphs Abs: 0.7 10*3/uL (ref 0.7–4.0)
MCH: 33.4 pg (ref 26.0–34.0)
MCHC: 32.7 g/dL (ref 30.0–36.0)
MCV: 102.2 fL — ABNORMAL HIGH (ref 80.0–100.0)
Monocytes Absolute: 0.3 10*3/uL (ref 0.1–1.0)
Monocytes Relative: 6 %
Neutro Abs: 3.8 10*3/uL (ref 1.7–7.7)
Neutrophils Relative %: 77 %
Platelet Count: 265 10*3/uL (ref 150–400)
RBC: 3.65 MIL/uL — ABNORMAL LOW (ref 4.22–5.81)
RDW: 15.9 % — ABNORMAL HIGH (ref 11.5–15.5)
WBC Count: 4.9 10*3/uL (ref 4.0–10.5)
nRBC: 0 % (ref 0.0–0.2)

## 2022-06-23 LAB — CMP (CANCER CENTER ONLY)
ALT: 15 U/L (ref 0–44)
AST: 25 U/L (ref 15–41)
Albumin: 4.4 g/dL (ref 3.5–5.0)
Alkaline Phosphatase: 61 U/L (ref 38–126)
Anion gap: 6 (ref 5–15)
BUN: 15 mg/dL (ref 8–23)
CO2: 27 mmol/L (ref 22–32)
Calcium: 9.3 mg/dL (ref 8.9–10.3)
Chloride: 104 mmol/L (ref 98–111)
Creatinine: 1.66 mg/dL — ABNORMAL HIGH (ref 0.61–1.24)
GFR, Estimated: 42 mL/min — ABNORMAL LOW (ref >60)
Glucose, Bld: 90 mg/dL (ref 70–99)
Potassium: 4.5 mmol/L (ref 3.5–5.1)
Sodium: 137 mmol/L (ref 135–145)
Total Bilirubin: 0.6 mg/dL (ref 0.3–1.2)
Total Protein: 6.7 g/dL (ref 6.5–8.1)

## 2022-06-23 LAB — LACTATE DEHYDROGENASE: LDH: 239 U/L — ABNORMAL HIGH (ref 98–192)

## 2022-06-23 MED ORDER — SODIUM CHLORIDE 0.9 % IV SOLN: Freq: Once | INTRAVENOUS | Status: AC

## 2022-06-23 MED ORDER — SODIUM CHLORIDE 0.9 % IV SOLN
40.0000 mg | Freq: Once | INTRAVENOUS | Status: AC
  Administered 2022-06-23: 40 mg via INTRAVENOUS
  Filled 2022-06-23: qty 4

## 2022-06-23 MED ORDER — PALONOSETRON HCL INJECTION 0.25 MG/5ML
0.2500 mg | Freq: Once | INTRAVENOUS | Status: AC
  Administered 2022-06-23: 0.25 mg via INTRAVENOUS
  Filled 2022-06-23: qty 5

## 2022-06-23 MED ORDER — BORTEZOMIB CHEMO SQ INJECTION 3.5 MG (2.5MG/ML)
1.5000 mg/m2 | Freq: Once | INTRAMUSCULAR | Status: DC
  Filled 2022-06-23: qty 1.4

## 2022-06-23 MED ORDER — SODIUM CHLORIDE 0.9 % IV SOLN
225.0000 mg/m2 | Freq: Once | INTRAVENOUS | Status: AC
  Administered 2022-06-23: 520 mg via INTRAVENOUS
  Filled 2022-06-23: qty 26

## 2022-06-23 NOTE — Progress Notes (Signed)
Ok to treat today with a Scr of 1.66 per Dr. [name]

## 2022-06-23 NOTE — Telephone Encounter (Signed)
Patient stated that he would prefer to wait until November for the cardioversion due to his schedule and to be with Dr. Sallyanne Kuster.   Cardioversion set for 11/16. Instructions sent through [facility].

## 2022-06-23 NOTE — Patient Instructions (Addendum)
[facility] ONCOLOGY  Discharge Instructions: Thank you for choosing [facility] to provide your oncology and hematology care.   If you have a lab appointment with the [facility], please go directly to the [facility] and check in at the registration area.   Wear comfortable clothing and clothing appropriate for easy access to any Portacath or PICC line.   We strive to give you quality time with your provider. You may need to reschedule your appointment if you arrive late (15 or more minutes).  Arriving late affects you and other patients whose appointments are after yours.  Also, if you miss three or more appointments without notifying the office, you may be dismissed from the clinic at the provider's discretion.      For prescription refill requests, have your pharmacy contact our office and allow 72 hours for refills to be completed.    Today you received the following chemotherapy and/or immunotherapy agents : Cytoxan      To help prevent nausea and vomiting after your treatment, we encourage you to take your nausea medication as directed.  BELOW ARE SYMPTOMS THAT SHOULD BE REPORTED IMMEDIATELY: *FEVER GREATER THAN 100.4 F (38 C) OR HIGHER *CHILLS OR SWEATING *NAUSEA AND VOMITING THAT IS NOT CONTROLLED WITH YOUR NAUSEA MEDICATION *UNUSUAL SHORTNESS OF BREATH *UNUSUAL BRUISING OR BLEEDING *URINARY PROBLEMS (pain or burning when urinating, or frequent urination) *BOWEL PROBLEMS (unusual diarrhea, constipation, pain near the anus) TENDERNESS IN MOUTH AND THROAT WITH OR WITHOUT PRESENCE OF ULCERS (sore throat, sores in mouth, or a toothache) UNUSUAL RASH, SWELLING OR PAIN  UNUSUAL VAGINAL DISCHARGE OR ITCHING   Items with * indicate a potential emergency and should be followed up as soon as possible or go to the Emergency Department if any problems should occur.  Please show the CHEMOTHERAPY ALERT CARD or IMMUNOTHERAPY ALERT CARD at check-in to  the Emergency Department and triage nurse.  Should you have questions after your visit or need to cancel or reschedule your appointment, please contact [facility]  Dept: [phone]  and follow the prompts.  Office hours are 8:00 a.m. to 4:30 p.m. Monday - Friday. Please note that voicemails left after 4:00 p.m. may not be returned until the following business day.  We are closed weekends and major holidays. You have access to a nurse at all times for urgent questions. Please call the main number to the clinic Dept: [phone] and follow the prompts.   For any non-urgent questions, you may also contact your provider using MyChart. We now offer e-Visits for anyone 16 and older to request care online for non-urgent symptoms. For details visit [email or web address].   Also download the MyChart app! Go to the app store, search "MyChart", open the app, select [facility], and log in with your MyChart username and password.  Masks are optional in the cancer centers. If you would like for your care team to wear a mask while they are taking care of you, please let them know. You may have one support person who is at least 77 years old accompany you for your appointments.

## 2022-06-23 NOTE — Telephone Encounter (Signed)
I can do on rounding week. If not possible, other provider is fine. Thanks

## 2022-06-23 NOTE — Progress Notes (Signed)
Received verbal report from previous RN. Ambulated patient to bathroom and patient was very unsteady due to numbness of feet. Patient stated that he was not sure if Dr. [name] was aware. This RN contacted Dr. [name] and made aware of patient numbness. Per Dr. [name] will hold the Velcade today and proceed with Cytoxan only. Patient made aware and per pharmacy will discard the Velcade.

## 2022-06-30 ENCOUNTER — Ambulatory Visit: Payer: No Typology Code available for payment source

## 2022-06-30 ENCOUNTER — Other Ambulatory Visit: Payer: No Typology Code available for payment source

## 2022-06-30 ENCOUNTER — Ambulatory Visit: Payer: No Typology Code available for payment source | Admitting: Hematology and Oncology

## 2022-06-30 ENCOUNTER — Inpatient Hospital Stay: Payer: No Typology Code available for payment source

## 2022-06-30 ENCOUNTER — Inpatient Hospital Stay (HOSPITAL_BASED_OUTPATIENT_CLINIC_OR_DEPARTMENT_OTHER): Payer: No Typology Code available for payment source | Admitting: Hematology and Oncology

## 2022-06-30 VITALS — BP 152/85 | HR 63 | Temp 98.0°F | Resp 16

## 2022-06-30 DIAGNOSIS — Z5112 Encounter for antineoplastic immunotherapy: Secondary | ICD-10-CM | POA: Diagnosis not present

## 2022-06-30 DIAGNOSIS — D631 Anemia in chronic kidney disease: Secondary | ICD-10-CM | POA: Diagnosis not present

## 2022-06-30 DIAGNOSIS — C9000 Multiple myeloma not having achieved remission: Secondary | ICD-10-CM

## 2022-06-30 DIAGNOSIS — N184 Chronic kidney disease, stage 4 (severe): Secondary | ICD-10-CM

## 2022-06-30 LAB — CBC WITH DIFFERENTIAL (CANCER CENTER ONLY)
Abs Immature Granulocytes: 0.03 10*3/uL (ref 0.00–0.07)
Basophils Absolute: 0 10*3/uL (ref 0.0–0.1)
Basophils Relative: 1 %
Eosinophils Absolute: 0.2 10*3/uL (ref 0.0–0.5)
Eosinophils Relative: 5 %
HCT: 37.9 % — ABNORMAL LOW (ref 39.0–52.0)
Hemoglobin: 12.3 g/dL — ABNORMAL LOW (ref 13.0–17.0)
Immature Granulocytes: 1 %
Lymphocytes Relative: 26 %
Lymphs Abs: 1.1 10*3/uL (ref 0.7–4.0)
MCH: 33.4 pg (ref 26.0–34.0)
MCHC: 32.5 g/dL (ref 30.0–36.0)
MCV: 103 fL — ABNORMAL HIGH (ref 80.0–100.0)
Monocytes Absolute: 0.5 10*3/uL (ref 0.1–1.0)
Monocytes Relative: 13 %
Neutro Abs: 2.3 10*3/uL (ref 1.7–7.7)
Neutrophils Relative %: 54 %
Platelet Count: 216 10*3/uL (ref 150–400)
RBC: 3.68 MIL/uL — ABNORMAL LOW (ref 4.22–5.81)
RDW: 15.4 % (ref 11.5–15.5)
WBC Count: 4.2 10*3/uL (ref 4.0–10.5)
nRBC: 0 % (ref 0.0–0.2)

## 2022-06-30 LAB — CMP (CANCER CENTER ONLY)
ALT: 16 U/L (ref 0–44)
AST: 23 U/L (ref 15–41)
Albumin: 4.2 g/dL (ref 3.5–5.0)
Alkaline Phosphatase: 62 U/L (ref 38–126)
Anion gap: 6 (ref 5–15)
BUN: 18 mg/dL (ref 8–23)
CO2: 25 mmol/L (ref 22–32)
Calcium: 8.9 mg/dL (ref 8.9–10.3)
Chloride: 107 mmol/L (ref 98–111)
Creatinine: 1.78 mg/dL — ABNORMAL HIGH (ref 0.61–1.24)
GFR, Estimated: 39 mL/min — ABNORMAL LOW (ref >60)
Glucose, Bld: 74 mg/dL (ref 70–99)
Potassium: 4.2 mmol/L (ref 3.5–5.1)
Sodium: 138 mmol/L (ref 135–145)
Total Bilirubin: 0.5 mg/dL (ref 0.3–1.2)
Total Protein: 6.6 g/dL (ref 6.5–8.1)

## 2022-06-30 LAB — LACTATE DEHYDROGENASE: LDH: 215 U/L — ABNORMAL HIGH (ref 98–192)

## 2022-06-30 MED ORDER — SODIUM CHLORIDE 0.9 % IV SOLN
200.0000 mg/m2 | Freq: Once | INTRAVENOUS | Status: AC
  Administered 2022-06-30: 480 mg via INTRAVENOUS
  Filled 2022-06-30: qty 24

## 2022-06-30 MED ORDER — PALONOSETRON HCL INJECTION 0.25 MG/5ML
0.2500 mg | Freq: Once | INTRAVENOUS | Status: AC
  Administered 2022-06-30: 0.25 mg via INTRAVENOUS
  Filled 2022-06-30: qty 5

## 2022-06-30 MED ORDER — SODIUM CHLORIDE 0.9 % IV SOLN: 40.0000 mg | Freq: Once | INTRAVENOUS | Status: DC

## 2022-06-30 MED ORDER — SODIUM CHLORIDE 0.9 % IV SOLN: Freq: Once | INTRAVENOUS | Status: AC

## 2022-06-30 MED ORDER — BORTEZOMIB CHEMO SQ INJECTION 3.5 MG (2.5MG/ML)
1.0000 mg/m2 | Freq: Once | INTRAMUSCULAR | Status: AC
  Administered 2022-06-30: 2.25 mg via SUBCUTANEOUS
  Filled 2022-06-30: qty 0.9

## 2022-06-30 NOTE — Progress Notes (Signed)
[facility] Telephone:([phone]   Fax:([phone]) [phone]  PROGRESS NOTE  Patient Care Team: [name], MD as PCP - General (Internal Medicine)  Hematological/Oncological History # IgA Lambda Multiple Myeloma 05/03/2015: last visit with Dr. Julien Nordmann at the [facility]. Was followed for IgA lambda MGUS. 12/11/2021: labs show M protein 3.4, Kappa 19.2, Lambda 1576.4, ratio 0.01. Cr 3.47, Hgb 8.6, WBC 5.7, MCV 99, Plt 221 12/23/2021: establish care with Dr. [name]  01/16/2022: Bone marrow biopsy performed, showed a 60% cellular bone marrow predominantly comprised of plasma cells making up 70 to 80%, lambda restricted.  Myeloma FISH panel showed no evidence of abnormalities. 02/07/2022: Cycle 1 Day 1 of CyBorD chemotherapy.  03/09/2022-03/17/2022: hospitalized for fever/pneumonia.  03/24/2022: Cycle 2 Day 1 of CyBorD chemotherapy.  04/22/2022: Cycle 3 Day 1 of CyBorD chemotherapy 05/19/2022-05/26/2022: Delayed start of Cycle 4 Day 1 of CyBorD chemotherapy due to neutropenia.  06/03/2022: Cycle 4 Day 1 of CyBorD chemotherapy. 25% dose reduction of cyclophosphamide due to cytopenias. 06/30/2022: Cycle 4 Day 22. Drop Velcade dose to 7m/m2 and Cyclophosphamide 200 mg/m2   Interval History:  Darren Hinton 76y.o. male with medical history significant for IgA lambda multiple myeloma who presents for a follow up visit. The patient's last visit was on 06/03/2022. In the interim since the last visit a dose of chemotherapy had been held due to neutropenia.   On exam today Mr. WCandelariareports he has been "not too bad and not too good".  He reports that he does have some numbness in his feet which is causing balance issues.  He has been having some trouble with stumbling and has had suggest himself.  He has not had any episodes where he has fallen uncontrolled and hit the floor.  He reports that he also has issues with sinus and congestion which is currently clearing after receiving  antibiotic from his primary care provider.  He reports that he does also have some swelling in his lower extremities and he is now for approximately the shin down.  He would like referral to physical therapy today.  Otherwise he is tolerating treatment well with no nausea, vomiting, or diarrhea.  His appetite has been good he is down about 5 pounds from his last visit due to his recent illness.  Patient denies any fevers, chills, night sweats, shortness of breath, chest pain, cough, neuropathy, peripheral edema, headaches or dizziness.  He has no other complaints.  A full 10 point ROS is otherwise negative.  MEDICAL HISTORY:  Past Medical History:  Diagnosis Date   Allergy    Anxiety    Arthritis    Depression    Heart murmur    Hyperlipidemia    Hyperplastic colon polyp    Hyperproteinemia    Hypertension    Hypothyroidism    Mitral valve prolapse    Paroxysmal atrial fibrillation (HCC)    Thyroid disease     SURGICAL HISTORY: Past Surgical History:  Procedure Laterality Date   CARDIOVERSION N/A 03/14/2022   Procedure: CARDIOVERSION;  Surgeon: HPixie Casino MD;  [facility]  Service: Cardiovascular;  Laterality: N/A;   TEE WITHOUT CARDIOVERSION N/A 03/14/2022   Procedure: TRANSESOPHAGEAL ECHOCARDIOGRAM (TEE);  Surgeon: HPixie Casino MD;  [facility];  Service: Cardiovascular;  Laterality: N/A;    SOCIAL HISTORY: Social History   Socioeconomic History   Marital status: Married    Spouse name: Not on file   Number of children: Not on file  Years of education: Not on file   Highest education level: Not on file  Occupational History   Not on file  Tobacco Use   Smoking status: Former    Years: 20.00    Types: Cigarettes    Quit date: 10/01/1978    Years since quitting: 43.7   Smokeless tobacco: Never   Tobacco comments:    Former smoker 04/03/22  Substance and Sexual Activity   Alcohol use: Yes    Alcohol/week: 2.0 standard drinks of alcohol     Types: 2 Glasses of wine per week    Comment: occasionally   Drug use: No   Sexual activity: Not on file  Other Topics Concern   Not on file  Social History Narrative   Not on file   Social Determinants of Health   Financial Resource Strain: Not on file  Food Insecurity: Not on file  Transportation Needs: Not on file  Physical Activity: Not on file  Stress: Not on file  Social Connections: Not on file  Intimate Partner Violence: Not on file    FAMILY HISTORY: Family History  Problem Relation Age of Onset   Cancer Father    Colon cancer Neg Hx    Esophageal cancer Neg Hx    Liver cancer Neg Hx    Pancreatic cancer Neg Hx    Rectal cancer Neg Hx    Stomach cancer Neg Hx     ALLERGIES:  is allergic to zithromax [azithromycin].  MEDICATIONS:  Current Outpatient Medications  Medication Sig Dispense Refill   acyclovir (ZOVIRAX) 400 MG tablet Take 1 tablet (400 mg total) by mouth 2 (two) times daily. 180 tablet 1   allopurinol (ZYLOPRIM) 300 MG tablet TAKE 1 TABLET BY MOUTH DAILY 100 tablet 2   ALPRAZolam (XANAX) 0.25 MG tablet Take 0.25 mg by mouth 2 (two) times daily as needed for anxiety.     amiodarone (PACERONE) 200 MG tablet Take 1 tablet (200 mg total) by mouth daily. 30 tablet 3   amLODipine (NORVASC) 5 MG tablet Take 1 tablet (5 mg total) by mouth daily. 30 tablet 2   amoxicillin (AMOXIL) 500 MG capsule Take 500 mg by mouth 3 (three) times daily.     apixaban (ELIQUIS) 5 MG TABS tablet Take 1 tablet (5 mg total) by mouth 2 (two) times daily. 180 tablet 1   atorvastatin (LIPITOR) 20 MG tablet Take 20 mg by mouth daily in the afternoon.     dexamethasone (DECADRON) 4 MG tablet Take 10 tablets (40 mg total) by mouth once a week. Take steroids pills the in the morning on chemotherapy days. 120 tablet 1   ergocalciferol (VITAMIN D2) 1.25 MG (50000 UT) capsule Take 50,000 Units by mouth every Wednesday.     ferrous gluconate (FERGON) 324 MG tablet Take 1 tablet (324 mg  total) by mouth 3 (three) times daily with meals. 90 tablet 0   fluticasone (FLONASE) 50 MCG/ACT nasal spray Place 2 sprays into the nose daily as needed for allergies.     gabapentin (NEURONTIN) 300 MG capsule Take 1 capsule (300 mg total) by mouth 2 (two) times daily. 60 capsule 1   levothyroxine (SYNTHROID) 100 MCG tablet Take 100 mcg by mouth See admin instructions. Take 1 tablet (100 mcg) by mouth on Mondays, Tuesdays, Wednesdays, Thursdays, Fridays & Saturdays in the morning. Skip a dose on Sundays.     loratadine (CLARITIN) 10 MG tablet Take 10 mg by mouth daily as needed for allergies.  meclizine (ANTIVERT) 25 MG tablet Take 1 tablet (25 mg total) by mouth 3 (three) times daily as needed for dizziness. 30 tablet 0   metoprolol succinate (TOPROL-XL) 25 MG 24 hr tablet Take 1 tablet (25 mg total) by mouth daily. Take with or immediately following a meal. 30 tablet 3   senna-docusate (SENOKOT-S) 8.6-50 MG tablet Take 1 tablet by mouth at bedtime as needed for mild constipation.     traMADol (ULTRAM) 50 MG tablet Take 1 tablet (50 mg total) by mouth every 6 (six) hours as needed. 30 tablet 0   vitamin B-12 (CYANOCOBALAMIN) 500 MCG tablet Take 500 mcg by mouth in the morning.     prochlorperazine (COMPAZINE) 10 MG tablet Take 1 tablet (10 mg total) by mouth every 6 (six) hours as needed for nausea or vomiting. (Patient not taking: Reported on 06/30/2022) 30 tablet 0   Current Facility-Administered Medications  Medication Dose Route Frequency Provider Last Rate Last Admin   0.9 %  sodium chloride infusion  500 mL Intravenous Once Armbruster, Carlota Raspberry, MD       Facility-Administered Medications Ordered in Other Visits  Medication Dose Route Frequency Provider Last Rate Last Admin   0.9 %  sodium chloride infusion   Intravenous Once Orson Slick, MD       bortezomib SQ (VELCADE) chemo injection (2.72m/mL concentration) 2.25 mg  1 mg/m2 (Treatment Plan Recorded) Subcutaneous Once DOrson Slick MD       cyclophosphamide (CYTOXAN) 480 mg in sodium chloride 0.9 % 250 mL chemo infusion  200 mg/m2 (Treatment Plan Recorded) Intravenous Once DOrson Slick MD       dexamethasone (DECADRON) 40 mg in sodium chloride 0.9 % 50 mL IVPB  40 mg Intravenous Once DOrson Slick MD       palonosetron (ALOXI) injection 0.25 mg  0.25 mg Intravenous Once DOrson Slick MD        REVIEW OF SYSTEMS:   Constitutional: ( - ) fevers, ( - )  chills , ( - ) night sweats Eyes: ( - ) blurriness of vision, ( - ) double vision, ( - ) watery eyes Ears, nose, mouth, throat, and face: ( - ) mucositis, ( - ) sore throat Respiratory: ( - ) cough, ( - ) dyspnea, ( - ) wheezes Cardiovascular: ( - ) palpitation, ( - ) chest discomfort, ( - ) lower extremity swelling Gastrointestinal:  ( - ) nausea, ( - ) heartburn, ( - ) change in bowel habits Skin: ( - ) abnormal skin rashes Lymphatics: ( - ) new lymphadenopathy, ( - ) easy bruising Neurological: ( - ) numbness, ( - ) tingling, ( - ) new weaknesses Behavioral/Psych: ( - ) mood change, ( - ) new changes  All other systems were reviewed with the patient and are negative.  PHYSICAL EXAMINATION: ECOG PERFORMANCE STATUS: 1 - Symptomatic but completely ambulatory  There were no vitals filed for this visit.  There were no vitals filed for this visit.   GENERAL: Chronically ill-appearing elderly African-American male, alert, no distress and comfortable SKIN: skin color, texture, turgor are normal, no rashes or significant lesions EYES: conjunctiva are pink and non-injected, sclera clear LUNGS: clear to auscultation and percussion with normal breathing effort HEART: regular rate & rhythm and no murmurs and no lower extremity edema Musculoskeletal: no cyanosis of digits and no clubbing  PSYCH: alert & oriented x 3, fluent speech NEURO: no focal motor/sensory deficits  LABORATORY DATA:  I have reviewed the data as listed    Latest Ref Rng &  Units 06/30/2022    1:38 PM 06/23/2022    2:15 PM 06/16/2022    8:14 AM  CBC  WBC 4.0 - 10.5 K/uL 4.2  4.9  2.0   Hemoglobin 13.0 - 17.0 g/dL 12.3  12.2  11.4   Hematocrit 39.0 - 52.0 % 37.9  37.3  34.9   Platelets 150 - 400 K/uL 216  265  119        Latest Ref Rng & Units 06/30/2022    1:38 PM 06/23/2022    2:15 PM 06/16/2022    8:14 AM  CMP  Glucose 70 - 99 mg/dL 74  90  138   BUN 8 - 23 mg/dL '18  15  17   ' Creatinine 0.61 - 1.24 mg/dL 1.78  1.66  1.79   Sodium 135 - 145 mmol/L 138  137  139   Potassium 3.5 - 5.1 mmol/L 4.2  4.5  3.8   Chloride 98 - 111 mmol/L 107  104  107   CO2 22 - 32 mmol/L '25  27  25   ' Calcium 8.9 - 10.3 mg/dL 8.9  9.3  8.9   Total Protein 6.5 - 8.1 g/dL 6.6  6.7  6.6   Total Bilirubin 0.3 - 1.2 mg/dL 0.5  0.6  0.6   Alkaline Phos 38 - 126 U/L 62  61  61   AST 15 - 41 U/L '23  25  22   ' ALT 0 - 44 U/L '16  15  14     ' Lab Results  Component Value Date   MPROTEIN Not Observed 06/03/2022   MPROTEIN Not Observed 05/19/2022   MPROTEIN Not Observed 05/05/2022   Lab Results  Component Value Date   KPAFRELGTCHN 11.4 06/03/2022   KPAFRELGTCHN 10.9 05/19/2022   KPAFRELGTCHN 10.6 05/05/2022   LAMBDASER 6.1 06/03/2022   LAMBDASER 6.6 05/19/2022   LAMBDASER 6.1 05/05/2022   KAPLAMBRATIO 1.87 (H) 06/03/2022   KAPLAMBRATIO 1.65 05/19/2022   KAPLAMBRATIO 1.74 (H) 05/05/2022    RADIOGRAPHIC STUDIES: No results found.  ASSESSMENT & PLAN Darren Hinton. 76 y.o. male with medical history significant for IgA lambda multiple myeloma who presents for a follow up visit.   Previously we discussed the disease process of multiple myeloma.  We discussed the incurable nature of this disease and the nature of the treatment.  We discussed that this is a cancer of the bone marrow with plasma cells producing an abundance of M protein which in this patient's case causes his severe renal dysfunction.  Additionally we noted that myeloma can cause lytic lesions of the bone,  anemia, and increased levels of calcium.  We discussed the treatment moving forward including doublet and triplet therapy.  Given the patient's good health overall, but poor kidney function I would recommend proceeding with CyBorD chemotherapy.  Mr. Condrey voices understanding of the treatment regimen and the plan moving forward.  At this time we will plan to proceed with Velcade 1.5 mg per metered squared subq weekly, cyclophosphamide 300 mg per metered squared IV weekly, and dexamethasone 20 mg p.o. weekly.  Cycles will consist of 4 weekly treatments and will continue for a minimum of 6 cycles before consideration of maintenance therapy.  Additionally if kidney function improves can consider transitioning regimen to VRD.    # IgA lambda Multiple Myeloma -- Bone marrow biopsy performed on 01/16/2022 showed increased plasma cells  consistent with a plasma cell neoplasm.  Normal FISH panel. --Patient meets diagnostic criteria based on kidney dysfunction and anemia. --Cycle 1 Day 1 of CyBorD chemotherapy started on 02/07/2022 Plan: --Most recent SPEP from 06/03/2022 showed M protein 0.0 g/dL, kappa 11.4, lambda 6.1, ratio 1.87. This is a robust response to therapy.  --today is Cycle 4 Day 22 of CyBorD --dosage of velcade decreased to 9m/m2 and cyclophosphamide to 200 mg/m2 --Labs today show white blood cell 4.2, hemoglobin 12.3, MCV 103, and platelets of 216. ANC 2.3.  --Cr  1.78, stable --RTC in 2 weeks with interval weekly treatments   # Leg Pain -- Likely a component of worsening neuropathy from his chemotherapy with pre-existing pain. -- continue gabapentin to 300 mg twice daily -- continue tramadol 50 mg every 6 hours as needed  #Supportive Care -- chemotherapy education complete -- port placement not required.  -- zofran [id number] PRN and compazine 1036mO q6H for nausea -- acyclovir 400m47m BID for VCZ prophylaxis -- allopurinol 300mg80mdaily for TLS prophylaxis --zometa to start after  dental clearance.   Orders Placed This Encounter  Procedures   Lactate dehydrogenase (LDH)    Standing Status:   Future    Standing Expiration Date:   08/04/2023   Multiple Myeloma Panel (SPEP&IFE w/QIG)    Standing Status:   Future    Standing Expiration Date:   08/04/2023   Kappa/lambda light chains    Standing Status:   Future    Standing Expiration Date:   08/04/2023   CBC with Differential ([facility])    Standing Status:   Future    Standing Expiration Date:   08/05/2023   CMP ([facility])    Standing Status:   Future    Standing Expiration Date:   08/05/2023   Lactate dehydrogenase (LDH)    Standing Status:   Future    Standing Expiration Date:   08/11/2023   CBC with Differential ([facility])    Standing Status:   Future    Standing Expiration Date:   08/12/2023   CMP ([facility])    Standing Status:   Future    Standing Expiration Date:   08/12/2023   Lactate dehydrogenase (LDH)    Standing Status:   Future    Standing Expiration Date:   08/18/2023   CBC with Differential ([facility])    Standing Status:   Future    Standing Expiration Date:   08/19/2023   CMP ([facility])    Standing Status:   Future    Standing Expiration Date:   08/19/2023   Lactate dehydrogenase (LDH)    Standing Status:   Future    Standing Expiration Date:   08/25/2023   CBC with Differential ([facility])    Standing Status:   Future    Standing Expiration Date:   08/26/2023   CMP ([facility])    Standing Status:   Future    Standing Expiration Date:   08/26/2023   Ambulatory referral to Physical Therapy    Referral Priority:   Routine    Referral Type:   Physical Medicine    Referral Reason:   Specialty Services Required    Requested Specialty:   Physical Therapy    Number of Visits Requested:   1   All questions were answered. The patient knows to call the clinic with any problems, questions or concerns.  I have spent  a  total of 30 minutes minutes of face-to-face and non-face-to-face time, preparing to see the patient, performing a medically appropriate examination, counseling and educating the patient, documenting clinical information in the electronic health record,  and care coordination.   Ledell Peoples, MD Department of Hematology/Oncology [place] at [facility] Phone: [phone] Pager: [phone] Email: [email or web address]  06/30/2022 3:52 PM

## 2022-06-30 NOTE — Patient Instructions (Signed)
[facility] ONCOLOGY  Discharge Instructions: Thank you for choosing [facility] to provide your oncology and hematology care.   If you have a lab appointment with the [facility], please go directly to the [facility] and check in at the registration area.   Wear comfortable clothing and clothing appropriate for easy access to any Portacath or PICC line.   We strive to give you quality time with your provider. You may need to reschedule your appointment if you arrive late (15 or more minutes).  Arriving late affects you and other patients whose appointments are after yours.  Also, if you miss three or more appointments without notifying the office, you may be dismissed from the clinic at the provider's discretion.      For prescription refill requests, have your pharmacy contact our office and allow 72 hours for refills to be completed.    Today you received the following chemotherapy and/or immunotherapy agents: bortezomib and cyclophosphamide      To help prevent nausea and vomiting after your treatment, we encourage you to take your nausea medication as directed.  BELOW ARE SYMPTOMS THAT SHOULD BE REPORTED IMMEDIATELY: *FEVER GREATER THAN 100.4 F (38 C) OR HIGHER *CHILLS OR SWEATING *NAUSEA AND VOMITING THAT IS NOT CONTROLLED WITH YOUR NAUSEA MEDICATION *UNUSUAL SHORTNESS OF BREATH *UNUSUAL BRUISING OR BLEEDING *URINARY PROBLEMS (pain or burning when urinating, or frequent urination) *BOWEL PROBLEMS (unusual diarrhea, constipation, pain near the anus) TENDERNESS IN MOUTH AND THROAT WITH OR WITHOUT PRESENCE OF ULCERS (sore throat, sores in mouth, or a toothache) UNUSUAL RASH, SWELLING OR PAIN  UNUSUAL VAGINAL DISCHARGE OR ITCHING   Items with * indicate a potential emergency and should be followed up as soon as possible or go to the Emergency Department if any problems should occur.  Please show the CHEMOTHERAPY ALERT CARD or IMMUNOTHERAPY  ALERT CARD at check-in to the Emergency Department and triage nurse.  Should you have questions after your visit or need to cancel or reschedule your appointment, please contact [facility]  Dept: [phone]  and follow the prompts.  Office hours are 8:00 a.m. to 4:30 p.m. Monday - Friday. Please note that voicemails left after 4:00 p.m. may not be returned until the following business day.  We are closed weekends and major holidays. You have access to a nurse at all times for urgent questions. Please call the main number to the clinic Dept: [phone] and follow the prompts.   For any non-urgent questions, you may also contact your provider using MyChart. We now offer e-Visits for anyone 11 and older to request care online for non-urgent symptoms. For details visit [email or web address].   Also download the MyChart app! Go to the app store, search "MyChart", open the app, select [facility], and log in with your MyChart username and password.  Masks are optional in the cancer centers. If you would like for your care team to wear a mask while they are taking care of you, please let them know. You may have one support person who is at least 77 years old accompany you for your appointments.

## 2022-06-30 NOTE — Progress Notes (Signed)
Per Dr [name], ok to treat with Scr 1.78

## 2022-07-01 ENCOUNTER — Ambulatory Visit (HOSPITAL_COMMUNITY): Admit: 2022-07-01 | Payer: No Typology Code available for payment source | Admitting: Cardiovascular Disease

## 2022-07-01 ENCOUNTER — Other Ambulatory Visit: Payer: No Typology Code available for payment source

## 2022-07-01 ENCOUNTER — Ambulatory Visit: Payer: No Typology Code available for payment source

## 2022-07-01 ENCOUNTER — Encounter (HOSPITAL_COMMUNITY): Payer: Self-pay

## 2022-07-01 ENCOUNTER — Other Ambulatory Visit: Payer: Self-pay

## 2022-07-01 ENCOUNTER — Ambulatory Visit: Payer: No Typology Code available for payment source | Admitting: Hematology and Oncology

## 2022-07-01 LAB — KAPPA/LAMBDA LIGHT CHAINS
Kappa free light chain: 15 mg/L (ref 3.3–19.4)
Kappa, lambda light chain ratio: 1.63 (ref 0.26–1.65)
Lambda free light chains: 9.2 mg/L (ref 5.7–26.3)

## 2022-07-01 SURGERY — CARDIOVERSION
Anesthesia: General

## 2022-07-02 ENCOUNTER — Other Ambulatory Visit: Payer: Self-pay

## 2022-07-04 LAB — MULTIPLE MYELOMA PANEL, SERUM
Albumin SerPl Elph-Mcnc: 3.8 g/dL (ref 2.9–4.4)
Albumin/Glob SerPl: 1.8 — ABNORMAL HIGH (ref 0.7–1.7)
Alpha 1: 0.2 g/dL (ref 0.0–0.4)
Alpha2 Glob SerPl Elph-Mcnc: 0.7 g/dL (ref 0.4–1.0)
B-Globulin SerPl Elph-Mcnc: 0.9 g/dL (ref 0.7–1.3)
Gamma Glob SerPl Elph-Mcnc: 0.4 g/dL (ref 0.4–1.8)
Globulin, Total: 2.2 g/dL (ref 2.2–3.9)
IgA: 14 mg/dL — ABNORMAL LOW (ref 61–437)
IgG (Immunoglobin G), Serum: 496 mg/dL — ABNORMAL LOW (ref 603–1613)
IgM (Immunoglobulin M), Srm: 15 mg/dL (ref 15–143)
Total Protein ELP: 6 g/dL (ref 6.0–8.5)

## 2022-07-07 ENCOUNTER — Inpatient Hospital Stay (HOSPITAL_BASED_OUTPATIENT_CLINIC_OR_DEPARTMENT_OTHER): Payer: No Typology Code available for payment source | Admitting: Hematology and Oncology

## 2022-07-07 ENCOUNTER — Inpatient Hospital Stay: Payer: No Typology Code available for payment source

## 2022-07-07 VITALS — BP 155/90 | HR 58 | Resp 16

## 2022-07-07 DIAGNOSIS — Z5112 Encounter for antineoplastic immunotherapy: Secondary | ICD-10-CM | POA: Diagnosis not present

## 2022-07-07 DIAGNOSIS — C9000 Multiple myeloma not having achieved remission: Secondary | ICD-10-CM

## 2022-07-07 LAB — CBC WITH DIFFERENTIAL (CANCER CENTER ONLY)
Abs Immature Granulocytes: 0.01 10*3/uL (ref 0.00–0.07)
Basophils Absolute: 0 10*3/uL (ref 0.0–0.1)
Basophils Relative: 1 %
Eosinophils Absolute: 0.3 10*3/uL (ref 0.0–0.5)
Eosinophils Relative: 8 %
HCT: 36.4 % — ABNORMAL LOW (ref 39.0–52.0)
Hemoglobin: 11.9 g/dL — ABNORMAL LOW (ref 13.0–17.0)
Immature Granulocytes: 0 %
Lymphocytes Relative: 22 %
Lymphs Abs: 0.7 10*3/uL (ref 0.7–4.0)
MCH: 33.8 pg (ref 26.0–34.0)
MCHC: 32.7 g/dL (ref 30.0–36.0)
MCV: 103.4 fL — ABNORMAL HIGH (ref 80.0–100.0)
Monocytes Absolute: 0.4 10*3/uL (ref 0.1–1.0)
Monocytes Relative: 15 %
Neutro Abs: 1.6 10*3/uL — ABNORMAL LOW (ref 1.7–7.7)
Neutrophils Relative %: 54 %
Platelet Count: 157 10*3/uL (ref 150–400)
RBC: 3.52 MIL/uL — ABNORMAL LOW (ref 4.22–5.81)
RDW: 15.5 % (ref 11.5–15.5)
WBC Count: 3 10*3/uL — ABNORMAL LOW (ref 4.0–10.5)
nRBC: 0 % (ref 0.0–0.2)

## 2022-07-07 LAB — CMP (CANCER CENTER ONLY)
ALT: 15 U/L (ref 0–44)
AST: 21 U/L (ref 15–41)
Albumin: 4.1 g/dL (ref 3.5–5.0)
Alkaline Phosphatase: 55 U/L (ref 38–126)
Anion gap: 6 (ref 5–15)
BUN: 16 mg/dL (ref 8–23)
CO2: 26 mmol/L (ref 22–32)
Calcium: 9 mg/dL (ref 8.9–10.3)
Chloride: 106 mmol/L (ref 98–111)
Creatinine: 1.87 mg/dL — ABNORMAL HIGH (ref 0.61–1.24)
GFR, Estimated: 37 mL/min — ABNORMAL LOW (ref >60)
Glucose, Bld: 130 mg/dL — ABNORMAL HIGH (ref 70–99)
Potassium: 4.1 mmol/L (ref 3.5–5.1)
Sodium: 138 mmol/L (ref 135–145)
Total Bilirubin: 0.4 mg/dL (ref 0.3–1.2)
Total Protein: 6.5 g/dL (ref 6.5–8.1)

## 2022-07-07 LAB — LACTATE DEHYDROGENASE: LDH: 191 U/L (ref 98–192)

## 2022-07-07 MED ORDER — SODIUM CHLORIDE 0.9 % IV SOLN
200.0000 mg/m2 | Freq: Once | INTRAVENOUS | Status: AC
  Administered 2022-07-07: 480 mg via INTRAVENOUS
  Filled 2022-07-07: qty 24

## 2022-07-07 MED ORDER — SODIUM CHLORIDE 0.9 % IV SOLN: Freq: Once | INTRAVENOUS | Status: AC

## 2022-07-07 MED ORDER — BORTEZOMIB CHEMO SQ INJECTION 3.5 MG (2.5MG/ML)
1.0000 mg/m2 | Freq: Once | INTRAMUSCULAR | Status: AC
  Administered 2022-07-07: 2.25 mg via SUBCUTANEOUS
  Filled 2022-07-07: qty 0.9

## 2022-07-07 MED ORDER — PALONOSETRON HCL INJECTION 0.25 MG/5ML
0.2500 mg | Freq: Once | INTRAVENOUS | Status: AC
  Administered 2022-07-07: 0.25 mg via INTRAVENOUS
  Filled 2022-07-07: qty 5

## 2022-07-07 NOTE — Patient Instructions (Signed)
[facility] ONCOLOGY   Discharge Instructions: Thank you for choosing [facility] to provide your oncology and hematology care.   If you have a lab appointment with the [facility], please go directly to the [facility] and check in at the registration area.   Wear comfortable clothing and clothing appropriate for easy access to any Portacath or PICC line.   We strive to give you quality time with your provider. You may need to reschedule your appointment if you arrive late (15 or more minutes).  Arriving late affects you and other patients whose appointments are after yours.  Also, if you miss three or more appointments without notifying the office, you may be dismissed from the clinic at the provider's discretion.      For prescription refill requests, have your pharmacy contact our office and allow 72 hours for refills to be completed.    Today you received the following chemotherapy and/or immunotherapy agents: Cyclophosphamide (Cytoxan) and Bortezomib (Velcade)      To help prevent nausea and vomiting after your treatment, we encourage you to take your nausea medication as directed.  BELOW ARE SYMPTOMS THAT SHOULD BE REPORTED IMMEDIATELY: *FEVER GREATER THAN 100.4 F (38 C) OR HIGHER *CHILLS OR SWEATING *NAUSEA AND VOMITING THAT IS NOT CONTROLLED WITH YOUR NAUSEA MEDICATION *UNUSUAL SHORTNESS OF BREATH *UNUSUAL BRUISING OR BLEEDING *URINARY PROBLEMS (pain or burning when urinating, or frequent urination) *BOWEL PROBLEMS (unusual diarrhea, constipation, pain near the anus) TENDERNESS IN MOUTH AND THROAT WITH OR WITHOUT PRESENCE OF ULCERS (sore throat, sores in mouth, or a toothache) UNUSUAL RASH, SWELLING OR PAIN  UNUSUAL VAGINAL DISCHARGE OR ITCHING   Items with * indicate a potential emergency and should be followed up as soon as possible or go to the Emergency Department if any problems should occur.  Please show the CHEMOTHERAPY ALERT CARD  or IMMUNOTHERAPY ALERT CARD at check-in to the Emergency Department and triage nurse.  Should you have questions after your visit or need to cancel or reschedule your appointment, please contact [facility]  Dept: [phone]  and follow the prompts.  Office hours are 8:00 a.m. to 4:30 p.m. Monday - Friday. Please note that voicemails left after 4:00 p.m. may not be returned until the following business day.  We are closed weekends and major holidays. You have access to a nurse at all times for urgent questions. Please call the main number to the clinic Dept: [phone] and follow the prompts.   For any non-urgent questions, you may also contact your provider using MyChart. We now offer e-Visits for anyone 62 and older to request care online for non-urgent symptoms. For details visit [email or web address].   Also download the MyChart app! Go to the app store, search "MyChart", open the app, select [facility], and log in with your MyChart username and password.  Masks are optional in the cancer centers. If you would like for your care team to wear a mask while they are taking care of you, please let them know. You may have one support person who is at least 77 years old accompany you for your appointments.

## 2022-07-07 NOTE — Progress Notes (Signed)
Per Dr. [name] - okay to treat with creatinine level of 1.87.

## 2022-07-07 NOTE — Progress Notes (Signed)
[facility] [phone]   [phone]  PROGRESS NOTE  Patient Care Team: [name], MD as PCP - General (Internal Medicine)  Hematological/Oncological History # IgA Lambda Multiple Myeloma 05/03/2015: last visit with Dr. Julien Nordmann at the [facility]. Was followed for IgA lambda [facility]US. 12/11/2021: labs show M protein 3.4, Kappa 19.2, Lambda 1576.4, ratio 0.01. Cr 3.47, Hgb 8.6, WBC 5.7, MCV 99, Plt 221 12/23/2021: establish care with Dr. [name]  01/16/2022: Bone marrow biopsy performed, showed a 60% cellular bone marrow predominantly comprised of plasma cells making up 70 to 80%, lambda restricted.  Myeloma FISH panel showed no evidence of abnormalities. 02/07/2022: Cycle 1 Day 1 of CyBorD chemotherapy.  03/09/2022-03/17/2022: hospitalized for fever/pneumonia.  03/24/2022: Cycle 2 Day 1 of CyBorD chemotherapy.  04/22/2022: Cycle 3 Day 1 of CyBorD chemotherapy 05/19/2022-05/26/2022: Delayed start of Cycle 4 Day 1 of CyBorD chemotherapy due to neutropenia.  06/03/2022: Cycle 4 Day 1 of CyBorD chemotherapy. 25% dose reduction of cyclophosphamide due to cytopenias. 06/30/2022: Cycle 4 Day 22. Drop Velcade dose to 73m/m2 and Cyclophosphamide 200 mg/m2  07/07/2022: Cycle 5 Day 1 of CyBorD chemotherapy with dose reductions.   Interval History:  Darren Hinton 76y.o. male with medical history significant for IgA lambda multiple myeloma who presents for a follow up visit. The patient's last visit was on 06/30/2022. In the interim since the last visit he has continued on CyBorD chemotherapy.   On exam today Mr. WGatsonreports he has been well overall in the brief interim since her last visit.  His neuropathy is about the same.  He notes he is stable on his feet though he is having some occasional dragging of the feet and numbness in his fingers.  He has had no falls in the interim since her last visit.  He notes his appetite is quite good and he is doing the best he  can to gain weight but he recently had a cold which caused him to lose weight.  He notes that the symptoms from this have resolved.  He is not having any shortness of breath, lightheadedness, or dizziness.  He notes his energy is still pretty good.  [facility]  Patient denies any fevers, chills, night sweats, shortness of breath, chest pain, cough, neuropathy, peripheral edema, headaches or dizziness.  He has no other complaints.  A full 10 point ROS is otherwise negative.  MEDICAL HISTORY:  Past Medical History:  Diagnosis Date   Allergy    Anxiety    Arthritis    Depression    Heart murmur    Hyperlipidemia    Hyperplastic colon polyp    Hyperproteinemia    Hypertension    Hypothyroidism    Mitral valve prolapse    Paroxysmal atrial fibrillation (HCC)    Thyroid disease     SURGICAL HISTORY: Past Surgical History:  Procedure Laterality Date   CARDIOVERSION N/A 03/14/2022   Procedure: CARDIOVERSION;  Surgeon: HPixie Casino MD;  [facility]  Service: Cardiovascular;  Laterality: N/A;   TEE WITHOUT CARDIOVERSION N/A 03/14/2022   Procedure: TRANSESOPHAGEAL ECHOCARDIOGRAM (TEE);  Surgeon: HPixie Casino MD;  [facility];  Service: Cardiovascular;  Laterality: N/A;    SOCIAL HISTORY: Social History   Socioeconomic History   Marital status: Married    Spouse name: Not on file   Number of children: Not on file   Years of education: Not on  file   Highest education level: Not on file  Occupational History   Not on file  Tobacco Use   Smoking status: Former    Years: 20.00    Types: Cigarettes    Quit date: 10/01/1978    Years since quitting: 43.7   Smokeless tobacco: Never   Tobacco comments:    Former smoker 04/03/22  Substance and Sexual Activity   Alcohol use: Yes    Alcohol/week: 2.0 standard drinks of alcohol    Types: 2 Glasses of wine per week    Comment: occasionally   Drug use: No   Sexual  activity: Not on file  Other Topics Concern   Not on file  Social History Narrative   Not on file   Social Determinants of Health   Financial Resource Strain: Not on file  Food Insecurity: Not on file  Transportation Needs: Not on file  Physical Activity: Not on file  Stress: Not on file  Social Connections: Not on file  Intimate Partner Violence: Not on file    FAMILY HISTORY: Family History  Problem Relation Age of Onset   Cancer Father    Colon cancer Neg Hx    Esophageal cancer Neg Hx    Liver cancer Neg Hx    Pancreatic cancer Neg Hx    Rectal cancer Neg Hx    Stomach cancer Neg Hx     ALLERGIES:  is allergic to zithromax [azithromycin].  MEDICATIONS:  Current Outpatient Medications  Medication Sig Dispense Refill   acyclovir (ZOVIRAX) 400 MG tablet Take 1 tablet (400 mg total) by mouth 2 (two) times daily. 180 tablet 1   allopurinol (ZYLOPRIM) 300 MG tablet TAKE 1 TABLET BY MOUTH DAILY 100 tablet 2   ALPRAZolam (XANAX) 0.25 MG tablet Take 0.25 mg by mouth 2 (two) times daily as needed for anxiety.     amiodarone (PACERONE) 200 MG tablet Take 1 tablet (200 mg total) by mouth daily. 30 tablet 3   amLODipine (NORVASC) 5 MG tablet Take 1 tablet (5 mg total) by mouth daily. 30 tablet 2   amoxicillin (AMOXIL) 500 MG capsule Take 500 mg by mouth 3 (three) times daily.     apixaban (ELIQUIS) 5 MG TABS tablet Take 1 tablet (5 mg total) by mouth 2 (two) times daily. 180 tablet 1   atorvastatin (LIPITOR) 20 MG tablet Take 20 mg by mouth daily in the afternoon.     dexamethasone (DECADRON) 4 MG tablet Take 10 tablets (40 mg total) by mouth once a week. Take steroids pills the in the morning on chemotherapy days. 120 tablet 1   ergocalciferol (VITAMIN D2) 1.25 MG (50000 UT) capsule Take 50,000 Units by mouth every Wednesday.     ferrous gluconate (FERGON) 324 MG tablet Take 1 tablet (324 mg total) by mouth 3 (three) times daily with meals. 90 tablet 0   fluticasone (FLONASE) 50  MCG/ACT nasal spray Place 2 sprays into the nose daily as needed for allergies.     gabapentin (NEURONTIN) 300 MG capsule Take 1 capsule (300 mg total) by mouth 2 (two) times daily. 60 capsule 1   levothyroxine (SYNTHROID) 100 MCG tablet Take 100 mcg by mouth See admin instructions. Take 1 tablet (100 mcg) by mouth on Mondays, Tuesdays, Wednesdays, Thursdays, Fridays & Saturdays in the morning. Skip a dose on Sundays.     loratadine (CLARITIN) 10 MG tablet Take 10 mg by mouth daily as needed for allergies.     meclizine (ANTIVERT) 25 MG  tablet Take 1 tablet (25 mg total) by mouth 3 (three) times daily as needed for dizziness. 30 tablet 0   metoprolol succinate (TOPROL-XL) 25 MG 24 hr tablet Take 1 tablet (25 mg total) by mouth daily. Take with or immediately following a meal. 30 tablet 3   prochlorperazine (COMPAZINE) 10 MG tablet Take 1 tablet (10 mg total) by mouth every 6 (six) hours as needed for nausea or vomiting. (Patient not taking: Reported on 06/30/2022) 30 tablet 0   senna-docusate (SENOKOT-S) 8.6-50 MG tablet Take 1 tablet by mouth at bedtime as needed for mild constipation.     traMADol (ULTRAM) 50 MG tablet Take 1 tablet (50 mg total) by mouth every 6 (six) hours as needed. 30 tablet 0   vitamin B-12 (CYANOCOBALAMIN) 500 MCG tablet Take 500 mcg by mouth in the morning.     Current Facility-Administered Medications  Medication Dose Route Frequency Provider Last Rate Last Admin   0.9 %  sodium chloride infusion  500 mL Intravenous Once Armbruster, Carlota Raspberry, MD        REVIEW OF SYSTEMS:   Constitutional: ( - ) fevers, ( - )  chills , ( - ) night sweats Eyes: ( - ) blurriness of vision, ( - ) double vision, ( - ) watery eyes Ears, nose, mouth, throat, and face: ( - ) mucositis, ( - ) sore throat Respiratory: ( - ) cough, ( - ) dyspnea, ( - ) wheezes Cardiovascular: ( - ) palpitation, ( - ) chest discomfort, ( - ) lower extremity swelling Gastrointestinal:  ( - ) nausea, ( - )  heartburn, ( - ) change in bowel habits Skin: ( - ) abnormal skin rashes Lymphatics: ( - ) new lymphadenopathy, ( - ) easy bruising Neurological: ( - ) numbness, ( - ) tingling, ( - ) new weaknesses Behavioral/Psych: ( - ) mood change, ( - ) new changes  All other systems were reviewed with the patient and are negative.  PHYSICAL EXAMINATION: ECOG PERFORMANCE STATUS: 1 - Symptomatic but completely ambulatory  Vitals:   07/07/22 1025  BP: (!) 167/87  Pulse: 69  Resp: 15  Temp: 98.2 F (36.8 C)  SpO2: 98%    Filed Weights   07/07/22 1025  Weight: 214 lb 1.6 oz (97.1 kg)     GENERAL: Chronically ill-appearing elderly African-American male, alert, no distress and comfortable SKIN: skin color, texture, turgor are normal, no rashes or significant lesions EYES: conjunctiva are pink and non-injected, sclera clear LUNGS: clear to auscultation and percussion with normal breathing effort HEART: regular rate & rhythm and no murmurs and no lower extremity edema Musculoskeletal: no cyanosis of digits and no clubbing  PSYCH: alert & oriented x 3, fluent speech NEURO: no focal motor/sensory deficits  LABORATORY DATA:  I have reviewed the data as listed    Latest Ref Rng & Units 07/07/2022   10:07 AM 06/30/2022    1:38 PM 06/23/2022    2:15 PM  CBC  WBC 4.0 - 10.5 K/uL 3.0  4.2  4.9   Hemoglobin 13.0 - 17.0 g/dL 11.9  12.3  12.2   Hematocrit 39.0 - 52.0 % 36.4  37.9  37.3   Platelets 150 - 400 K/uL 157  216  265        Latest Ref Rng & Units 07/07/2022   10:07 AM 06/30/2022    1:38 PM 06/23/2022    2:15 PM  CMP  Glucose 70 - 99 mg/dL 130  74  90   BUN 8 - 23 mg/dL _0 Creatinine 0.61 - 1.24 mg/dL 1.87  1.78  1.66   Sodium 135 - 145 mmol/L 138  138  137   Potassium 3.5 - 5.1 mmol/L 4.1  4.2  4.5   Chloride 98 - 111 mmol/L 106  107  104   CO2 22 - 32 mmol/L _1 Calcium 8.9 - 10.3 mg/dL 9.0  8.9  9.3   Total Protein 6.5 - 8.1 g/dL 6.5  6.6  6.7   Total  Bilirubin 0.3 - 1.2 mg/dL 0.4  0.5  0.6   Alkaline Phos 38 - 126 U/L 55  62  61   AST 15 - 41 U/L _2 ALT 0 - 44 U/L _3 Lab Results  Component Value Date   MPROTEIN Not Observed 06/30/2022   MPROTEIN Not Observed 06/03/2022   MPROTEIN Not Observed 05/19/2022   Lab Results  Component Value Date   KPAFRELGTCHN 15.0 06/30/2022   KPAFRELGTCHN 11.4 06/03/2022   KPAFRELGTCHN 10.9 05/19/2022   LAMBDASER 9.2 06/30/2022   LAMBDASER 6.1 06/03/2022   LAMBDASER 6.6 05/19/2022   KAPLAMBRATIO 1.63 06/30/2022   KAPLAMBRATIO 1.87 (H) 06/03/2022   KAPLAMBRATIO 1.65 05/19/2022    RADIOGRAPHIC STUDIES: No results found.  ASSESSMENT & PLAN Darren Hinton. 76 y.o. male with medical history significant for IgA lambda multiple myeloma who presents for a follow up visit.   Previously we discussed the disease process of multiple myeloma.  We discussed the incurable nature of this disease and the nature of the treatment.  We discussed that this is a cancer of the bone marrow with plasma cells producing an abundance of M protein which in this patient's case causes his severe renal dysfunction.  Additionally we noted that myeloma can cause lytic lesions of the bone, anemia, and increased levels of calcium.  We discussed the treatment moving forward including doublet and triplet therapy.  Given the patient's good health overall, but poor kidney function I would recommend proceeding with CyBorD chemotherapy.  Darren Hinton voices understanding of the treatment regimen and the plan moving forward.  At this time we will plan to proceed with Velcade 1.5 mg per metered squared subq weekly, cyclophosphamide 300 mg per metered squared IV weekly, and dexamethasone 20 mg p.o. weekly.  Cycles will consist of 4 weekly treatments and will continue for a minimum of 6 cycles before consideration of maintenance therapy.  Additionally if kidney function improves can consider transitioning regimen to VRD.     # IgA lambda Multiple Myeloma -- Bone marrow biopsy performed on 01/16/2022 showed increased plasma cells consistent with a plasma cell neoplasm.  Normal FISH panel. --Patient meets diagnostic criteria based on kidney dysfunction and anemia. --Cycle 1 Day 1 of CyBorD chemotherapy started on 02/07/2022 Plan: --Most recent SPEP from 06/03/2022 showed M protein 0.0 g/dL, kappa 11.4, lambda 6.1, ratio 1.87. This is a robust response to therapy.  --today is Cycle 5 day 1 of CyBorD --dosage of velcade decreased to 65m/m2 and cyclophosphamide to 200 mg/m2 --Labs today show white blood cell 3.0, Hgb 11.9, MCV 103.4, Plt 157, ANC 1.6 --Cr  1.87, stable --RTC in 2 weeks with interval weekly treatments   # Leg Pain -- Likely a component of worsening neuropathy from his chemotherapy with pre-existing pain. -- continue gabapentin to 300 mg twice daily --  continue tramadol 50 mg every 6 hours as needed  #Supportive Care -- chemotherapy education complete -- port placement not required.  -- zofran 60m q8H PRN and compazine 11mPO q6H for nausea -- acyclovir 40062mO BID for VCZ prophylaxis -- allopurinol 300m43m daily for TLS prophylaxis --zometa to start after dental clearance.   No orders of the defined types were placed in this encounter.  All questions were answered. The patient knows to call the clinic with any problems, questions or concerns.  I have spent a total of 25 minutes minutes of face-to-face and non-face-to-face time, preparing to see the patient, performing a medically appropriate examination, counseling and educating the patient, documenting clinical information in the electronic health record,  and care coordination.   Darren Hinton Department of Hematology/[facility]: [phone]: [phone]: [email or web address]  07/07/2022 2:27 PM

## 2022-07-08 ENCOUNTER — Ambulatory Visit: Payer: No Typology Code available for payment source

## 2022-07-08 ENCOUNTER — Ambulatory Visit: Payer: No Typology Code available for payment source | Admitting: Hematology and Oncology

## 2022-07-08 ENCOUNTER — Other Ambulatory Visit: Payer: No Typology Code available for payment source

## 2022-07-08 LAB — KAPPA/LAMBDA LIGHT CHAINS
Kappa free light chain: 13.8 mg/L (ref 3.3–19.4)
Kappa, lambda light chain ratio: 1.79 — ABNORMAL HIGH (ref 0.26–1.65)
Lambda free light chains: 7.7 mg/L (ref 5.7–26.3)

## 2022-07-10 ENCOUNTER — Other Ambulatory Visit (HOSPITAL_COMMUNITY): Payer: Self-pay | Admitting: Physician Assistant

## 2022-07-11 LAB — MULTIPLE MYELOMA PANEL, SERUM
Albumin SerPl Elph-Mcnc: 3.6 g/dL (ref 2.9–4.4)
Albumin/Glob SerPl: 1.6 (ref 0.7–1.7)
Alpha 1: 0.2 g/dL (ref 0.0–0.4)
Alpha2 Glob SerPl Elph-Mcnc: 0.7 g/dL (ref 0.4–1.0)
B-Globulin SerPl Elph-Mcnc: 0.9 g/dL (ref 0.7–1.3)
Gamma Glob SerPl Elph-Mcnc: 0.4 g/dL (ref 0.4–1.8)
Globulin, Total: 2.3 g/dL (ref 2.2–3.9)
IgA: 19 mg/dL — ABNORMAL LOW (ref 61–437)
IgG (Immunoglobin G), Serum: 478 mg/dL — ABNORMAL LOW (ref 603–1613)
IgM (Immunoglobulin M), Srm: 19 mg/dL (ref 15–143)
Total Protein ELP: 5.9 g/dL — ABNORMAL LOW (ref 6.0–8.5)

## 2022-07-14 ENCOUNTER — Other Ambulatory Visit: Payer: Self-pay | Admitting: Hematology and Oncology

## 2022-07-14 ENCOUNTER — Inpatient Hospital Stay: Payer: No Typology Code available for payment source | Attending: Hematology and Oncology

## 2022-07-14 ENCOUNTER — Inpatient Hospital Stay: Payer: No Typology Code available for payment source

## 2022-07-14 VITALS — BP 150/79 | HR 60 | Temp 98.0°F | Resp 16 | Wt 213.8 lb

## 2022-07-14 DIAGNOSIS — D649 Anemia, unspecified: Secondary | ICD-10-CM | POA: Insufficient documentation

## 2022-07-14 DIAGNOSIS — Z5112 Encounter for antineoplastic immunotherapy: Secondary | ICD-10-CM | POA: Insufficient documentation

## 2022-07-14 DIAGNOSIS — Z87891 Personal history of nicotine dependence: Secondary | ICD-10-CM | POA: Insufficient documentation

## 2022-07-14 DIAGNOSIS — Z7901 Long term (current) use of anticoagulants: Secondary | ICD-10-CM | POA: Insufficient documentation

## 2022-07-14 DIAGNOSIS — Z5111 Encounter for antineoplastic chemotherapy: Secondary | ICD-10-CM | POA: Diagnosis present

## 2022-07-14 DIAGNOSIS — Z809 Family history of malignant neoplasm, unspecified: Secondary | ICD-10-CM | POA: Diagnosis not present

## 2022-07-14 DIAGNOSIS — C9000 Multiple myeloma not having achieved remission: Secondary | ICD-10-CM | POA: Diagnosis present

## 2022-07-14 DIAGNOSIS — M21371 Foot drop, right foot: Secondary | ICD-10-CM | POA: Diagnosis not present

## 2022-07-14 DIAGNOSIS — Z8719 Personal history of other diseases of the digestive system: Secondary | ICD-10-CM | POA: Diagnosis not present

## 2022-07-14 DIAGNOSIS — I480 Paroxysmal atrial fibrillation: Secondary | ICD-10-CM | POA: Diagnosis not present

## 2022-07-14 DIAGNOSIS — R202 Paresthesia of skin: Secondary | ICD-10-CM | POA: Insufficient documentation

## 2022-07-14 DIAGNOSIS — Z79899 Other long term (current) drug therapy: Secondary | ICD-10-CM | POA: Insufficient documentation

## 2022-07-14 DIAGNOSIS — Z881 Allergy status to other antibiotic agents status: Secondary | ICD-10-CM | POA: Diagnosis not present

## 2022-07-14 DIAGNOSIS — R200 Anesthesia of skin: Secondary | ICD-10-CM | POA: Insufficient documentation

## 2022-07-14 DIAGNOSIS — M79606 Pain in leg, unspecified: Secondary | ICD-10-CM | POA: Insufficient documentation

## 2022-07-14 LAB — CMP (CANCER CENTER ONLY)
ALT: 16 U/L (ref 0–44)
AST: 22 U/L (ref 15–41)
Albumin: 4.1 g/dL (ref 3.5–5.0)
Alkaline Phosphatase: 60 U/L (ref 38–126)
Anion gap: 8 (ref 5–15)
BUN: 16 mg/dL (ref 8–23)
CO2: 26 mmol/L (ref 22–32)
Calcium: 9.1 mg/dL (ref 8.9–10.3)
Chloride: 107 mmol/L (ref 98–111)
Creatinine: 1.89 mg/dL — ABNORMAL HIGH (ref 0.61–1.24)
GFR, Estimated: 36 mL/min — ABNORMAL LOW (ref >60)
Glucose, Bld: 108 mg/dL — ABNORMAL HIGH (ref 70–99)
Potassium: 4.2 mmol/L (ref 3.5–5.1)
Sodium: 141 mmol/L (ref 135–145)
Total Bilirubin: 0.5 mg/dL (ref 0.3–1.2)
Total Protein: 6.4 g/dL — ABNORMAL LOW (ref 6.5–8.1)

## 2022-07-14 LAB — CBC WITH DIFFERENTIAL (CANCER CENTER ONLY)
Abs Immature Granulocytes: 0.01 10*3/uL (ref 0.00–0.07)
Basophils Absolute: 0 10*3/uL (ref 0.0–0.1)
Basophils Relative: 1 %
Eosinophils Absolute: 0.2 10*3/uL (ref 0.0–0.5)
Eosinophils Relative: 4 %
HCT: 37.8 % — ABNORMAL LOW (ref 39.0–52.0)
Hemoglobin: 12.3 g/dL — ABNORMAL LOW (ref 13.0–17.0)
Immature Granulocytes: 0 %
Lymphocytes Relative: 20 %
Lymphs Abs: 0.7 10*3/uL (ref 0.7–4.0)
MCH: 34 pg (ref 26.0–34.0)
MCHC: 32.5 g/dL (ref 30.0–36.0)
MCV: 104.4 fL — ABNORMAL HIGH (ref 80.0–100.0)
Monocytes Absolute: 0.5 10*3/uL (ref 0.1–1.0)
Monocytes Relative: 14 %
Neutro Abs: 2.2 10*3/uL (ref 1.7–7.7)
Neutrophils Relative %: 61 %
Platelet Count: 162 10*3/uL (ref 150–400)
RBC: 3.62 MIL/uL — ABNORMAL LOW (ref 4.22–5.81)
RDW: 15.2 % (ref 11.5–15.5)
WBC Count: 3.6 10*3/uL — ABNORMAL LOW (ref 4.0–10.5)
nRBC: 0 % (ref 0.0–0.2)

## 2022-07-14 LAB — LACTATE DEHYDROGENASE: LDH: 206 U/L — ABNORMAL HIGH (ref 98–192)

## 2022-07-14 MED ORDER — SODIUM CHLORIDE 0.9 % IV SOLN
200.0000 mg/m2 | Freq: Once | INTRAVENOUS | Status: AC
  Administered 2022-07-14: 480 mg via INTRAVENOUS
  Filled 2022-07-14: qty 24

## 2022-07-14 MED ORDER — SODIUM CHLORIDE 0.9 % IV SOLN: Freq: Once | INTRAVENOUS | Status: AC

## 2022-07-14 MED ORDER — BORTEZOMIB CHEMO SQ INJECTION 3.5 MG (2.5MG/ML)
1.0000 mg/m2 | Freq: Once | INTRAMUSCULAR | Status: AC
  Administered 2022-07-14: 2.25 mg via SUBCUTANEOUS
  Filled 2022-07-14: qty 0.9

## 2022-07-14 MED ORDER — PALONOSETRON HCL INJECTION 0.25 MG/5ML
0.2500 mg | Freq: Once | INTRAVENOUS | Status: AC
  Administered 2022-07-14: 0.25 mg via INTRAVENOUS
  Filled 2022-07-14: qty 5

## 2022-07-14 NOTE — Progress Notes (Signed)
Patient took his dexamethasone at about 0740 today.  Ok to treat with neuropathy issue in fingers and Scr 1.89 per Dr. [name].

## 2022-07-14 NOTE — Patient Instructions (Signed)
[facility] ONCOLOGY   Discharge Instructions: Thank you for choosing [facility] to provide your oncology and hematology care.   If you have a lab appointment with the [facility], please go directly to the [facility] and check in at the registration area.   Wear comfortable clothing and clothing appropriate for easy access to any Portacath or PICC line.   We strive to give you quality time with your provider. You may need to reschedule your appointment if you arrive late (15 or more minutes).  Arriving late affects you and other patients whose appointments are after yours.  Also, if you miss three or more appointments without notifying the office, you may be dismissed from the clinic at the provider's discretion.      For prescription refill requests, have your pharmacy contact our office and allow 72 hours for refills to be completed.    Today you received the following chemotherapy and/or immunotherapy agents: Cyclophosphamide (Cytoxan) and Bortezomib (Velcade)      To help prevent nausea and vomiting after your treatment, we encourage you to take your nausea medication as directed.  BELOW ARE SYMPTOMS THAT SHOULD BE REPORTED IMMEDIATELY: *FEVER GREATER THAN 100.4 F (38 C) OR HIGHER *CHILLS OR SWEATING *NAUSEA AND VOMITING THAT IS NOT CONTROLLED WITH YOUR NAUSEA MEDICATION *UNUSUAL SHORTNESS OF BREATH *UNUSUAL BRUISING OR BLEEDING *URINARY PROBLEMS (pain or burning when urinating, or frequent urination) *BOWEL PROBLEMS (unusual diarrhea, constipation, pain near the anus) TENDERNESS IN MOUTH AND THROAT WITH OR WITHOUT PRESENCE OF ULCERS (sore throat, sores in mouth, or a toothache) UNUSUAL RASH, SWELLING OR PAIN  UNUSUAL VAGINAL DISCHARGE OR ITCHING   Items with * indicate a potential emergency and should be followed up as soon as possible or go to the Emergency Department if any problems should occur.  Please show the CHEMOTHERAPY ALERT CARD  or IMMUNOTHERAPY ALERT CARD at check-in to the Emergency Department and triage nurse.  Should you have questions after your visit or need to cancel or reschedule your appointment, please contact [facility]  Dept: [phone]  and follow the prompts.  Office hours are 8:00 a.m. to 4:30 p.m. Monday - Friday. Please note that voicemails left after 4:00 p.m. may not be returned until the following business day.  We are closed weekends and major holidays. You have access to a nurse at all times for urgent questions. Please call the main number to the clinic Dept: [phone] and follow the prompts.   For any non-urgent questions, you may also contact your provider using MyChart. We now offer e-Visits for anyone 39 and older to request care online for non-urgent symptoms. For details visit [email or web address].   Also download the MyChart app! Go to the app store, search "MyChart", open the app, select [facility], and log in with your MyChart username and password.  Masks are optional in the cancer centers. If you would like for your care team to wear a mask while they are taking care of you, please let them know. You may have one support person who is at least 76 years old accompany you for your appointments.

## 2022-07-15 ENCOUNTER — Other Ambulatory Visit: Payer: No Typology Code available for payment source

## 2022-07-15 ENCOUNTER — Other Ambulatory Visit: Payer: Self-pay

## 2022-07-15 ENCOUNTER — Ambulatory Visit: Payer: No Typology Code available for payment source

## 2022-07-17 ENCOUNTER — Telehealth: Payer: Self-pay | Admitting: Cardiovascular Disease

## 2022-07-17 NOTE — Telephone Encounter (Signed)
*  STAT* If patient is at the pharmacy, call can be transferred to refill team.   1. Which medications need to be refilled? (please list name of each medication and dose if known) need a copy of his original prescription  for his Eliquis and a copy of the l doctor's last office note  2. Which pharmacy/location (including street and city if local pharmacy) is medication to be sent to?  [facility] - fax to [phone]   Att: Dr Jonnie Kind  3. Do they need a 30 day or 90 day supply? 90 days and refills

## 2022-07-18 ENCOUNTER — Other Ambulatory Visit: Payer: Self-pay

## 2022-07-18 ENCOUNTER — Telehealth: Payer: Self-pay | Admitting: Cardiovascular Disease

## 2022-07-18 DIAGNOSIS — I4891 Unspecified atrial fibrillation: Secondary | ICD-10-CM

## 2022-07-18 MED ORDER — APIXABAN 5 MG PO TABS: 5.0000 mg | ORAL_TABLET | Freq: Two times a day (BID) | ORAL | 1 refills | Status: AC

## 2022-07-18 NOTE — Telephone Encounter (Signed)
Prescription refill request for Eliquis received. Indication:afib Last office visit:9/23 Scr:1.8 Age: 76 Weight:97 kg  Prescription refilled

## 2022-07-18 NOTE — Telephone Encounter (Signed)
Spoke with pt wife, he is having a cardioversion next week. He had lab work drawn on 07/14/22 at the cancer center. She reports he has a hard time getting around and is currently having side effects from his recent chemo and he can not get out easily. Aware those lab will be fine.

## 2022-07-18 NOTE — Telephone Encounter (Signed)
  Pt's wife calling, she said, pt is having side effects with his chemo and would like to do the lab work on Monday same day at his appt with cancer center. They would like for Dr. Loletha Grayer know if that enough time prior his procedure on Thursday

## 2022-07-21 ENCOUNTER — Inpatient Hospital Stay: Payer: No Typology Code available for payment source

## 2022-07-21 ENCOUNTER — Encounter: Payer: Self-pay | Admitting: Hematology and Oncology

## 2022-07-21 ENCOUNTER — Inpatient Hospital Stay (HOSPITAL_BASED_OUTPATIENT_CLINIC_OR_DEPARTMENT_OTHER): Payer: No Typology Code available for payment source | Admitting: Hematology and Oncology

## 2022-07-21 VITALS — BP 169/92 | HR 65 | Temp 98.3°F | Resp 15 | Wt 222.6 lb

## 2022-07-21 VITALS — BP 150/78 | HR 63 | Temp 98.2°F | Resp 16

## 2022-07-21 DIAGNOSIS — C9000 Multiple myeloma not having achieved remission: Secondary | ICD-10-CM

## 2022-07-21 DIAGNOSIS — Z5112 Encounter for antineoplastic immunotherapy: Secondary | ICD-10-CM | POA: Diagnosis not present

## 2022-07-21 LAB — CBC WITH DIFFERENTIAL (CANCER CENTER ONLY)
Abs Immature Granulocytes: 0.01 10*3/uL (ref 0.00–0.07)
Basophils Absolute: 0 10*3/uL (ref 0.0–0.1)
Basophils Relative: 0 %
Eosinophils Absolute: 0.1 10*3/uL (ref 0.0–0.5)
Eosinophils Relative: 3 %
HCT: 37.2 % — ABNORMAL LOW (ref 39.0–52.0)
Hemoglobin: 12.2 g/dL — ABNORMAL LOW (ref 13.0–17.0)
Immature Granulocytes: 0 %
Lymphocytes Relative: 18 %
Lymphs Abs: 0.6 10*3/uL — ABNORMAL LOW (ref 0.7–4.0)
MCH: 33.9 pg (ref 26.0–34.0)
MCHC: 32.8 g/dL (ref 30.0–36.0)
MCV: 103.3 fL — ABNORMAL HIGH (ref 80.0–100.0)
Monocytes Absolute: 0.5 10*3/uL (ref 0.1–1.0)
Monocytes Relative: 16 %
Neutro Abs: 2 10*3/uL (ref 1.7–7.7)
Neutrophils Relative %: 63 %
Platelet Count: 180 10*3/uL (ref 150–400)
RBC: 3.6 MIL/uL — ABNORMAL LOW (ref 4.22–5.81)
RDW: 15.1 % (ref 11.5–15.5)
WBC Count: 3.2 10*3/uL — ABNORMAL LOW (ref 4.0–10.5)
nRBC: 0 % (ref 0.0–0.2)

## 2022-07-21 LAB — CMP (CANCER CENTER ONLY)
ALT: 19 U/L (ref 0–44)
AST: 26 U/L (ref 15–41)
Albumin: 4.3 g/dL (ref 3.5–5.0)
Alkaline Phosphatase: 52 U/L (ref 38–126)
Anion gap: 7 (ref 5–15)
BUN: 20 mg/dL (ref 8–23)
CO2: 27 mmol/L (ref 22–32)
Calcium: 9.2 mg/dL (ref 8.9–10.3)
Chloride: 105 mmol/L (ref 98–111)
Creatinine: 1.98 mg/dL — ABNORMAL HIGH (ref 0.61–1.24)
GFR, Estimated: 34 mL/min — ABNORMAL LOW (ref >60)
Glucose, Bld: 116 mg/dL — ABNORMAL HIGH (ref 70–99)
Potassium: 4.3 mmol/L (ref 3.5–5.1)
Sodium: 139 mmol/L (ref 135–145)
Total Bilirubin: 0.5 mg/dL (ref 0.3–1.2)
Total Protein: 6.6 g/dL (ref 6.5–8.1)

## 2022-07-21 LAB — LACTATE DEHYDROGENASE: LDH: 211 U/L — ABNORMAL HIGH (ref 98–192)

## 2022-07-21 MED ORDER — PALONOSETRON HCL INJECTION 0.25 MG/5ML
0.2500 mg | Freq: Once | INTRAVENOUS | Status: AC
  Administered 2022-07-21: 0.25 mg via INTRAVENOUS
  Filled 2022-07-21: qty 5

## 2022-07-21 MED ORDER — SODIUM CHLORIDE 0.9 % IV SOLN
200.0000 mg/m2 | Freq: Once | INTRAVENOUS | Status: AC
  Administered 2022-07-21: 480 mg via INTRAVENOUS
  Filled 2022-07-21: qty 24

## 2022-07-21 MED ORDER — BORTEZOMIB CHEMO SQ INJECTION 3.5 MG (2.5MG/ML)
1.0000 mg/m2 | Freq: Once | INTRAMUSCULAR | Status: AC
  Administered 2022-07-21: 2.25 mg via SUBCUTANEOUS
  Filled 2022-07-21: qty 0.9

## 2022-07-21 MED ORDER — SODIUM CHLORIDE 0.9 % IV SOLN: Freq: Once | INTRAVENOUS | Status: AC

## 2022-07-21 NOTE — Patient Instructions (Signed)
[facility]  Discharge Instructions: Thank you for choosing [facility] to provide your oncology and hematology care.   If you have a lab appointment with the [facility], please go directly to the [facility] and check in at the registration area.   Wear comfortable clothing and clothing appropriate for easy access to any Portacath or PICC line.   We strive to give you quality time with your provider. You may need to reschedule your appointment if you arrive late (15 or more minutes).  Arriving late affects you and other patients whose appointments are after yours.  Also, if you miss three or more appointments without notifying the office, you may be dismissed from the clinic at the provider's discretion.      For prescription refill requests, have your pharmacy contact our office and allow 72 hours for refills to be completed.    Today you received the following chemotherapy and/or immunotherapy agents: Bortezomib (Velcade) and Cyclophosphamide.   To help prevent nausea and vomiting after your treatment, we encourage you to take your nausea medication as directed.  BELOW ARE SYMPTOMS THAT SHOULD BE REPORTED IMMEDIATELY: *FEVER GREATER THAN 100.4 F (38 C) OR HIGHER *CHILLS OR SWEATING *NAUSEA AND VOMITING THAT IS NOT CONTROLLED WITH YOUR NAUSEA MEDICATION *UNUSUAL SHORTNESS OF BREATH *UNUSUAL BRUISING OR BLEEDING *URINARY PROBLEMS (pain or burning when urinating, or frequent urination) *BOWEL PROBLEMS (unusual diarrhea, constipation, pain near the anus) TENDERNESS IN MOUTH AND THROAT WITH OR WITHOUT PRESENCE OF ULCERS (sore throat, sores in mouth, or a toothache) UNUSUAL RASH, SWELLING OR PAIN  UNUSUAL VAGINAL DISCHARGE OR ITCHING   Items with * indicate a potential emergency and should be followed up as soon as possible or go to the Emergency Department if any problems should occur.  Please show the CHEMOTHERAPY ALERT CARD or  IMMUNOTHERAPY ALERT CARD at check-in to the Emergency Department and triage nurse.  Should you have questions after your visit or need to cancel or reschedule your appointment, please contact [facility]  Dept: [phone]  and follow the prompts.  Office hours are 8:00 a.m. to 4:30 p.m. Monday - Friday. Please note that voicemails left after 4:00 p.m. may not be returned until the following business day.  We are closed weekends and major holidays. You have access to a nurse at all times for urgent questions. Please call the main number to the clinic Dept: [phone] and follow the prompts.   For any non-urgent questions, you may also contact your provider using MyChart. We now offer e-Visits for anyone 28 and older to request care online for non-urgent symptoms. For details visit [email or web address].   Also download the MyChart app! Go to the app store, search "MyChart", open the app, select [facility], and log in with your MyChart username and password.  Masks are optional in the cancer centers. If you would like for your care team to wear a mask while they are taking care of you, please let them know. You may have one support person who is at least 77 years old accompany you for your appointments. Bortezomib Injection What is this medication? BORTEZOMIB (bor TEZ oh mib) treats lymphoma. It may also be used to treat multiple myeloma, a type of bone marrow cancer. It works by blocking a protein that causes cancer cells to grow and multiply. This helps to slow or stop the spread of cancer cells. This medicine may be used for other purposes; ask  your health care provider or pharmacist if you have questions. COMMON BRAND NAME(S): Velcade What should I tell my care team before I take this medication? They need to know if you have any of these conditions: Dehydration Diabetes Heart disease Liver disease Tingling of the fingers or toes or other nerve disorder An  unusual or allergic reaction to bortezomib, other medications, foods, dyes, or preservatives If you or your partner are pregnant or trying to get pregnant Breastfeeding How should I use this medication? This medication is injected into a vein or under the skin. It is given by your care team in a hospital or clinic setting. Talk to your care team about the use of this medication in children. Special care may be needed. Overdosage: If you think you have taken too much of this medicine contact a poison control center or emergency room at once. NOTE: This medicine is only for you. Do not share this medicine with others. What if I miss a dose? Keep appointments for follow-up doses. It is important not to miss your dose. Call your care team if you are unable to keep an appointment. What may interact with this medication? Ketoconazole Rifampin This list may not describe all possible interactions. Give your health care provider a list of all the medicines, herbs, non-prescription drugs, or dietary supplements you use. Also tell them if you smoke, drink alcohol, or use illegal drugs. Some items may interact with your medicine. What should I watch for while using this medication? Your condition will be monitored carefully while you are receiving this medication. You may need blood work while taking this medication. This medication may affect your coordination, reaction time, or judgment. Do not drive or operate machinery until you know how this medication affects you. Sit up or stand slowly to reduce the risk of dizzy or fainting spells. Drinking alcohol with this medication can increase the risk of these side effects. This medication may increase your risk of getting an infection. Call your care team for advice if you get a fever, chills, sore throat, or other symptoms of a cold or flu. Do not treat yourself. Try to avoid being around people who are sick. Check with your care team if you have severe  diarrhea, nausea, and vomiting, or if you sweat a lot. The loss of too much body fluid may make it dangerous for you to take this medication. Talk to your care team if you may be pregnant. Serious birth defects can occur if you take this medication during pregnancy and for 7 months after the last dose. You will need a negative pregnancy test before starting this medication. Contraception is recommended while taking this medication and for 7 months after the last dose. Your care team can help you find the option that works for you. If your partner can get pregnant, use a condom during sex while taking this medication and for 4 months after the last dose. Do not breastfeed while taking this medication and for 2 months after the last dose. This medication may cause infertility. Talk to your care team if you are concerned about your fertility. What side effects may I notice from receiving this medication? Side effects that you should report to your care team as soon as possible: Allergic reactions--skin rash, itching, hives, swelling of the face, lips, tongue, or throat Bleeding--bloody or black, tar-like stools, vomiting blood or brown material that looks like coffee grounds, red or dark brown urine, small red or purple spots on skin,  unusual bruising or bleeding Bleeding in the brain--severe headache, stiff neck, confusion, dizziness, change in vision, numbness or weakness of the face, arm, or leg, trouble speaking, trouble walking, vomiting Bowel blockage--stomach cramping, unable to have a bowel movement or pass gas, loss of appetite, vomiting Heart failure--shortness of breath, swelling of the ankles, feet, or hands, sudden weight gain, unusual weakness or fatigue Infection--fever, chills, cough, sore throat, wounds that don't heal, pain or trouble when passing urine, general feeling of discomfort or being unwell Liver injury--right upper belly pain, loss of appetite, nausea, light-colored stool, dark  yellow or brown urine, yellowing skin or eyes, unusual weakness or fatigue Low blood pressure--dizziness, feeling faint or lightheaded, blurry vision Lung injury--shortness of breath or trouble breathing, cough, spitting up blood, chest pain, fever Pain, tingling, or numbness in the hands or feet Severe or prolonged diarrhea Stomach pain, bloody diarrhea, pale skin, unusual weakness or fatigue, decrease in the amount of urine, which may be signs of hemolytic uremic syndrome Sudden and severe headache, confusion, change in vision, seizures, which may be signs of posterior reversible encephalopathy syndrome (PRES) TTP--purple spots on the skin or inside the mouth, pale skin, yellowing skin or eyes, unusual weakness or fatigue, fever, fast or irregular heartbeat, confusion, change in vision, trouble speaking, trouble walking Tumor lysis syndrome (TLS)--nausea, vomiting, diarrhea, decrease in the amount of urine, dark urine, unusual weakness or fatigue, confusion, muscle pain or cramps, fast or irregular heartbeat, joint pain Side effects that usually do not require medical attention (report to your care team if they continue or are bothersome): Constipation Diarrhea Fatigue Loss of appetite Nausea This list may not describe all possible side effects. Call your doctor for medical advice about side effects. You may report side effects to FDA at 1-800-FDA-1088. Where should I keep my medication? This medication is given in a hospital or clinic. It will not be stored at home. NOTE: This sheet is a summary. It may not cover all possible information. If you have questions about this medicine, talk to your doctor, pharmacist, or health care provider.  2023 Elsevier/Gold Standard (2022-01-22 00:00:00) Cyclophosphamide Injection What is this medication? CYCLOPHOSPHAMIDE (sye kloe FOSS fa mide) treats some types of cancer. It works by slowing down the growth of cancer cells. This medicine may be used for  other purposes; ask your health care provider or pharmacist if you have questions. COMMON BRAND NAME(S): Cyclophosphamide, Cytoxan, Neosar What should I tell my care team before I take this medication? They need to know if you have any of these conditions: Heart disease Irregular heartbeat or rhythm Infection Kidney problems Liver disease Low blood cell levels (white cells, platelets, or red blood cells) Lung disease Previous radiation Trouble passing urine An unusual or allergic reaction to cyclophosphamide, other medications, foods, dyes, or preservatives Pregnant or trying to get pregnant Breast-feeding How should I use this medication? This medication is injected into a vein. It is given by your care team in a hospital or clinic setting. Talk to your care team about the use of this medication in children. Special care may be needed. Overdosage: If you think you have taken too much of this medicine contact a poison control center or emergency room at once. NOTE: This medicine is only for you. Do not share this medicine with others. What if I miss a dose? Keep appointments for follow-up doses. It is important not to miss your dose. Call your care team if you are unable to keep an appointment. What may  interact with this medication? Amphotericin B Amiodarone Azathioprine Certain antivirals for HIV or hepatitis Certain medications for blood pressure, such as enalapril, lisinopril, quinapril Cyclosporine Diuretics Etanercept Indomethacin Medications that relax muscles Metronidazole Natalizumab Tamoxifen Warfarin This list may not describe all possible interactions. Give your health care provider a list of all the medicines, herbs, non-prescription drugs, or dietary supplements you use. Also tell them if you smoke, drink alcohol, or use illegal drugs. Some items may interact with your medicine. What should I watch for while using this medication? This medication may make you  feel generally unwell. This is not uncommon as chemotherapy can affect healthy cells as well as cancer cells. Report any side effects. Continue your course of treatment even though you feel ill unless your care team tells you to stop. You may need blood work while you are taking this medication. This medication may increase your risk of getting an infection. Call your care team for advice if you get a fever, chills, sore throat, or other symptoms of a cold or flu. Do not treat yourself. Try to avoid being around people who are sick. Avoid taking medications that contain aspirin, acetaminophen, ibuprofen, naproxen, or ketoprofen unless instructed by your care team. These medications may hide a fever. Be careful brushing or flossing your teeth or using a toothpick because you may get an infection or bleed more easily. If you have any dental work done, tell your dentist you are receiving this medication. Drink water or other fluids as directed. Urinate often, even at night. Some products may contain alcohol. Ask your care team if this medication contains alcohol. Be sure to tell all care teams you are taking this medicine. Certain medicines, like metronidazole and disulfiram, can cause an unpleasant reaction when taken with alcohol. The reaction includes flushing, headache, nausea, vomiting, sweating, and increased thirst. The reaction can last from 30 minutes to several hours. Talk to your care team if you wish to become pregnant or think you might be pregnant. This medication can cause serious birth defects if taken during pregnancy and for 1 year after the last dose. A negative pregnancy test is required before starting this medication. A reliable form of contraception is recommended while taking this medication and for 1 year after the last dose. Talk to your care team about reliable forms of contraception. Do not father a child while taking this medication and for 4 months after the last dose. Use a condom  during this time period. Do not breast-feed while taking this medication or for 1 week after the last dose. This medication may cause infertility. Talk to your care team if you are concerned about your fertility. Talk to your care team about your risk of cancer. You may be more at risk for certain types of cancer if you take this medication. What side effects may I notice from receiving this medication? Side effects that you should report to your care team as soon as possible: Allergic reactions--skin rash, itching, hives, swelling of the face, lips, tongue, or throat Dry cough, shortness of breath or trouble breathing Heart failure--shortness of breath, swelling of the ankles, feet, or hands, sudden weight gain, unusual weakness or fatigue Heart muscle inflammation--unusual weakness or fatigue, shortness of breath, chest pain, fast or irregular heartbeat, dizziness, swelling of the ankles, feet, or hands Heart rhythm changes--fast or irregular heartbeat, dizziness, feeling faint or lightheaded, chest pain, trouble breathing Infection--fever, chills, cough, sore throat, wounds that don't heal, pain or trouble when passing urine,  general feeling of discomfort or being unwell Kidney injury--decrease in the amount of urine, swelling of the ankles, hands, or feet Liver injury--right upper belly pain, loss of appetite, nausea, light-colored stool, dark yellow or brown urine, yellowing skin or eyes, unusual weakness or fatigue Low red blood cell level--unusual weakness or fatigue, dizziness, headache, trouble breathing Low sodium level--muscle weakness, fatigue, dizziness, headache, confusion Red or dark brown urine Unusual bruising or bleeding Side effects that usually do not require medical attention (report to your care team if they continue or are bothersome): Hair loss Irregular menstrual cycles or spotting Loss of appetite Nausea Pain, redness, or swelling with sores inside the mouth or  throat Vomiting This list may not describe all possible side effects. Call your doctor for medical advice about side effects. You may report side effects to FDA at 1-800-FDA-1088. Where should I keep my medication? This medication is given in a hospital or clinic. It will not be stored at home. NOTE: This sheet is a summary. It may not cover all possible information. If you have questions about this medicine, talk to your doctor, pharmacist, or health care provider.  2023 Elsevier/Gold Standard (2021-10-15 00:00:00)

## 2022-07-21 NOTE — Progress Notes (Signed)
Darren Hinton [phone]   [phone]  PROGRESS NOTE  Patient Care Team: Darren Lopes, MD as PCP - General (Internal Medicine)  Hematological/Oncological History # IgA Lambda Multiple Myeloma 05/03/2015: last visit with Dr. Julien Hinton at the [facility]. Was followed for IgA lambda MGUS. 12/11/2021: labs show M protein 3.4, Kappa 19.2, Lambda 1576.4, ratio 0.01. Cr 3.47, Hgb 8.6, WBC 5.7, MCV 99, Plt 221 12/23/2021: establish care with Dr. Lorenso Hinton  01/16/2022: Bone marrow biopsy performed, showed a 60% cellular bone marrow predominantly comprised of plasma cells making up 70 to 80%, lambda restricted.  Myeloma FISH panel showed no evidence of abnormalities. 02/07/2022: Cycle 1 Day 1 of CyBorD chemotherapy.  03/09/2022-03/17/2022: hospitalized for fever/pneumonia.  03/24/2022: Cycle 2 Day 1 of CyBorD chemotherapy.  04/22/2022: Cycle 3 Day 1 of CyBorD chemotherapy 05/19/2022-05/26/2022: Delayed start of Cycle 4 Day 1 of CyBorD chemotherapy due to neutropenia.  06/03/2022: Cycle 4 Day 1 of CyBorD chemotherapy. 25% dose reduction of cyclophosphamide due to cytopenias. 06/30/2022: Cycle 4 Day 22. Drop Velcade dose to 56m/m2 and Cyclophosphamide 200 mg/m2  07/07/2022: Cycle 5 Day 1 of CyBorD chemotherapy with dose reductions.   Interval History:  Darren Hinton 76y.o. male with medical history significant for IgA lambda multiple myeloma who presents for a follow up visit. The patient's last visit was on 07/07/2022. In the interim since the last visit he has continued on CyBorD chemotherapy.   On exam today Mr. WFodorreports he has been well in the interim since our last visit.  He reports that he is in a wheelchair today because he "did not want to drag his bedroom shoes around".  He notes that his treatments have been going well.  He does have some numbness and tingling predominantly in his feet and has a little bit of right foot drop.  He does occasionally  stumble but has been overall steady on his feet.  He notes he does have some dullness at his fingertips.  He is having some soreness in his belly on the right side of his abdomen which he thinks may be due to the fall he had over a chair recently.  He reports his appetite is been strong and he is intentionally eating to try to bring his weight up.  He is up to 222 pounds up from 214 pounds at his last visit.  He reports his energy is "there" and that he does have some trouble getting up and going.  He reports he is trying to work with physical therapy and has a set up for 08/18/2022.  He otherwise denies any fevers, chills, sweats, nausea, vomiting or diarrhea.  A full 10 point ROS was otherwise negative.  MEDICAL HISTORY:  Past Medical History:  Diagnosis Date   Allergy    Anxiety    Arthritis    Depression    Heart murmur    Hyperlipidemia    Hyperplastic colon polyp    Hyperproteinemia    Hypertension    Hypothyroidism    Mitral valve prolapse    Paroxysmal atrial fibrillation (HCC)    Thyroid disease     SURGICAL HISTORY: Past Surgical History:  Procedure Laterality Date   CARDIOVERSION N/A 03/14/2022   Procedure: CARDIOVERSION;  Surgeon: HPixie Casino MD;  [facility] [facility]  Service: Cardiovascular;  Laterality: N/A;   TEE WITHOUT CARDIOVERSION N/A 03/14/2022   Procedure: TRANSESOPHAGEAL ECHOCARDIOGRAM (TEE);  Surgeon: HPixie Casino MD;  [place] [facility]  Service:  Cardiovascular;  Laterality: N/A;    SOCIAL HISTORY: Social History   Socioeconomic History   Marital status: Married    Spouse name: Not on file   Number of children: Not on file   Years of education: Not on file   Highest education level: Not on file  Occupational History   Not on file  Tobacco Use   Smoking status: Former    Years: 20.00    Types: Cigarettes    Quit date: 10/01/1978    Years since quitting: 43.8   Smokeless tobacco: Never   Tobacco comments:    Former smoker 04/03/22   Substance and Sexual Activity   Alcohol use: Yes    Alcohol/week: 2.0 standard drinks of alcohol    Types: 2 Glasses of wine per week    Comment: occasionally   Drug use: No   Sexual activity: Not on file  Other Topics Concern   Not on file  Social History Narrative   Not on file   Social Determinants of Health   Financial Resource Strain: Not on file  Food Insecurity: Not on file  Transportation Needs: Not on file  Physical Activity: Not on file  Stress: Not on file  Social Connections: Not on file  Intimate Partner Violence: Not on file    FAMILY HISTORY: Family History  Problem Relation Age of Onset   Cancer Father    Colon cancer Neg Hx    Esophageal cancer Neg Hx    Liver cancer Neg Hx    Pancreatic cancer Neg Hx    Rectal cancer Neg Hx    Stomach cancer Neg Hx     ALLERGIES:  is allergic to zithromax [azithromycin].  MEDICATIONS:  Current Outpatient Medications  Medication Sig Dispense Refill   acyclovir (ZOVIRAX) 400 MG tablet Take 1 tablet (400 mg total) by mouth 2 (two) times daily. 180 tablet 1   allopurinol (ZYLOPRIM) 300 MG tablet TAKE 1 TABLET BY MOUTH DAILY 100 tablet 2   ALPRAZolam (XANAX) 0.25 MG tablet Take 0.25 mg by mouth 2 (two) times daily as needed for anxiety.     amiodarone (PACERONE) 200 MG tablet Take 1 tablet (200 mg total) by mouth daily. 30 tablet 3   amLODipine (NORVASC) 5 MG tablet Take 1 tablet (5 mg total) by mouth daily. 30 tablet 2   amoxicillin (AMOXIL) 500 MG capsule Take 500 mg by mouth 3 (three) times daily.     apixaban (ELIQUIS) 5 MG TABS tablet Take 1 tablet (5 mg total) by mouth 2 (two) times daily. 180 tablet 1   atorvastatin (LIPITOR) 20 MG tablet Take 20 mg by mouth daily in the afternoon.     dexamethasone (DECADRON) 4 MG tablet Take 10 tablets (40 mg total) by mouth once a week. Take steroids pills the in the morning on chemotherapy days. 120 tablet 1   ergocalciferol (VITAMIN D2) 1.25 MG (50000 UT) capsule Take 50,000  Units by mouth every Wednesday.     ferrous gluconate (FERGON) 324 MG tablet Take 1 tablet (324 mg total) by mouth 3 (three) times daily with meals. 90 tablet 0   fluticasone (FLONASE) 50 MCG/ACT nasal spray Place 2 sprays into the nose daily as needed for allergies.     gabapentin (NEURONTIN) 300 MG capsule Take 1 capsule (300 mg total) by mouth 2 (two) times daily. 60 capsule 1   levothyroxine (SYNTHROID) 100 MCG tablet Take 100 mcg by mouth See admin instructions. Take 1 tablet (100 mcg) by mouth  on Mondays, Tuesdays, Wednesdays, Thursdays, Fridays & Saturdays in the morning. Skip a dose on Sundays.     loratadine (CLARITIN) 10 MG tablet Take 10 mg by mouth daily as needed for allergies.     meclizine (ANTIVERT) 25 MG tablet Take 1 tablet (25 mg total) by mouth 3 (three) times daily as needed for dizziness. 30 tablet 0   metoprolol succinate (TOPROL-XL) 25 MG 24 hr tablet Take 1 tablet (25 mg total) by mouth daily. Take with or immediately following a meal. 30 tablet 3   prochlorperazine (COMPAZINE) 10 MG tablet Take 1 tablet (10 mg total) by mouth every 6 (six) hours as needed for nausea or vomiting. (Patient not taking: Reported on 06/30/2022) 30 tablet 0   senna-docusate (SENOKOT-S) 8.6-50 MG tablet Take 1 tablet by mouth at bedtime as needed for mild constipation.     traMADol (ULTRAM) 50 MG tablet Take 1 tablet (50 mg total) by mouth every 6 (six) hours as needed. 30 tablet 0   vitamin B-12 (CYANOCOBALAMIN) 500 MCG tablet Take 500 mcg by mouth in the morning.     Current Facility-Administered Medications  Medication Dose Route Frequency Provider Last Rate Last Admin   0.9 %  sodium chloride infusion  500 mL Intravenous Once Armbruster, Carlota Raspberry, MD        REVIEW OF SYSTEMS:   Constitutional: ( - ) fevers, ( - )  chills , ( - ) night sweats Eyes: ( - ) blurriness of vision, ( - ) double vision, ( - ) watery eyes Ears, nose, mouth, throat, and face: ( - ) mucositis, ( - ) sore  throat Respiratory: ( - ) cough, ( - ) dyspnea, ( - ) wheezes Cardiovascular: ( - ) palpitation, ( - ) chest discomfort, ( - ) lower extremity swelling Gastrointestinal:  ( - ) nausea, ( - ) heartburn, ( - ) change in bowel habits Skin: ( - ) abnormal skin rashes Lymphatics: ( - ) new lymphadenopathy, ( - ) easy bruising Neurological: ( - ) numbness, ( - ) tingling, ( - ) new weaknesses Behavioral/Psych: ( - ) mood change, ( - ) new changes  All other systems were reviewed with the patient and are negative.  PHYSICAL EXAMINATION: ECOG PERFORMANCE STATUS: 1 - Symptomatic but completely ambulatory  Vitals:   07/21/22 0847  BP: (!) 169/92  Pulse: 65  Resp: 15  Temp: 98.3 F (36.8 C)  SpO2: 97%    Filed Weights   07/21/22 0847  Weight: 222 lb 9.6 oz (101 kg)     GENERAL: Chronically ill-appearing elderly African-American male, alert, no distress and comfortable SKIN: skin color, texture, turgor are normal, no rashes or significant lesions EYES: conjunctiva are pink and non-injected, sclera clear LUNGS: clear to auscultation and percussion with normal breathing effort HEART: regular rate & rhythm and no murmurs and no lower extremity edema Musculoskeletal: no cyanosis of digits and no clubbing  PSYCH: alert & oriented x 3, fluent speech NEURO: no focal motor/sensory deficits  LABORATORY DATA:  I have reviewed the data as listed    Latest Ref Rng & Units 07/21/2022    8:19 AM 07/14/2022    8:04 AM 07/07/2022   10:07 AM  CBC  WBC 4.0 - 10.5 K/uL 3.2  3.6  3.0   Hemoglobin 13.0 - 17.0 g/dL 12.2  12.3  11.9   Hematocrit 39.0 - 52.0 % 37.2  37.8  36.4   Platelets 150 - 400 K/uL 180  162  157        Latest Ref Rng & Units 07/21/2022    8:19 AM 07/14/2022    8:04 AM 07/07/2022   10:07 AM  CMP  Glucose 70 - 99 mg/dL 116  108  130   BUN 8 - 23 mg/dL _0 Creatinine 0.61 - 1.24 mg/dL 1.98  1.89  1.87   Sodium 135 - 145 mmol/L 139  141  138   Potassium 3.5 - 5.1  mmol/L 4.3  4.2  4.1   Chloride 98 - 111 mmol/L 105  107  106   CO2 22 - 32 mmol/L _1 Calcium 8.9 - 10.3 mg/dL 9.2  9.1  9.0   Total Protein 6.5 - 8.1 g/dL 6.6  6.4  6.5   Total Bilirubin 0.3 - 1.2 mg/dL 0.5  0.5  0.4   Alkaline Phos 38 - 126 U/L 52  60  55   AST 15 - 41 U/L _2 ALT 0 - 44 U/L _3 Lab Results  Component Value Date   MPROTEIN Not Observed 07/07/2022   MPROTEIN Not Observed 06/30/2022   MPROTEIN Not Observed 06/03/2022   Lab Results  Component Value Date   KPAFRELGTCHN 13.8 07/07/2022   KPAFRELGTCHN 15.0 06/30/2022   KPAFRELGTCHN 11.4 06/03/2022   LAMBDASER 7.7 07/07/2022   LAMBDASER 9.2 06/30/2022   LAMBDASER 6.1 06/03/2022   KAPLAMBRATIO 1.79 (H) 07/07/2022   KAPLAMBRATIO 1.63 06/30/2022   KAPLAMBRATIO 1.87 (H) 06/03/2022    RADIOGRAPHIC STUDIES: No results found.  ASSESSMENT & PLAN Darren Hinton. 76 y.o. male with medical history significant for IgA lambda multiple myeloma who presents for a follow up visit.   Previously we discussed the disease process of multiple myeloma.  We discussed the incurable nature of this disease and the nature of the treatment.  We discussed that this is a cancer of the bone marrow with plasma cells producing an abundance of M protein which in this patient's case causes his severe renal dysfunction.  Additionally we noted that myeloma can cause lytic lesions of the bone, anemia, and increased levels of calcium.  We discussed the treatment moving forward including doublet and triplet therapy.  Given the patient's good health overall, but poor kidney function I would recommend proceeding with CyBorD chemotherapy.  Mr. Gee voices understanding of the treatment regimen and the plan moving forward.  At this time we will plan to proceed with Velcade 1.5 mg per metered squared subq weekly, cyclophosphamide 300 mg per metered squared IV weekly, and dexamethasone 20 mg p.o. weekly.  Cycles will consist  of 4 weekly treatments and will continue for a minimum of 6 cycles before consideration of maintenance therapy.  Additionally if kidney function improves can consider transitioning regimen to VRD.    # IgA lambda Multiple Myeloma -- Bone marrow biopsy performed on 01/16/2022 showed increased plasma cells consistent with a plasma cell neoplasm.  Normal FISH panel. --Patient meets diagnostic criteria based on kidney dysfunction and anemia. --Cycle 1 Day 1 of CyBorD chemotherapy started on 02/07/2022 Plan: --Most recent SPEP from 06/03/2022 showed M protein 0.0 g/dL, kappa 13.8, lambda 7.7, ratio 1.79. This is a robust response to therapy.  --today is Cycle 5 day 15 of CyBorD --previously dosage of velcade decreased to 53m/m2 and cyclophosphamide to 200 mg/m2 --Labs today show white blood cell 3.2, hemoglobin 12.2, MCV  103.3, and platelets 180 --Cr 1.98, stable --RTC in 2 weeks with interval weekly treatments   # Leg Pain -- Likely a component of worsening neuropathy from his chemotherapy with pre-existing pain. -- continue gabapentin to 300 mg twice daily -- continue tramadol 50 mg every 6 hours as needed  #Supportive Care -- chemotherapy education complete -- port placement not required.  -- zofran 15m q8H PRN and compazine 114mPO q6H for nausea -- acyclovir 40065mO BID for VCZ prophylaxis -- allopurinol 300m14m daily for TLS prophylaxis --zometa to start after dental clearance.   No orders of the defined types were placed in this encounter.  All questions were answered. The patient knows to call the clinic with any problems, questions or concerns.  I have spent a total of 30 minutes minutes of face-to-face and non-face-to-face time, preparing to see the patient, performing a medically appropriate examination, counseling and educating the patient, documenting clinical information in the electronic health record,  and care coordination.   Darren Hinton Department of  Hematology/Oncology [facility]: [phone]: [phone]: [email or web address]  07/21/2022 9:28 AM

## 2022-07-21 NOTE — Progress Notes (Signed)
Pt. states he took Decadron 40 mg po this am at home.

## 2022-07-22 ENCOUNTER — Ambulatory Visit: Payer: No Typology Code available for payment source

## 2022-07-22 ENCOUNTER — Other Ambulatory Visit: Payer: No Typology Code available for payment source

## 2022-07-22 ENCOUNTER — Ambulatory Visit: Payer: No Typology Code available for payment source | Admitting: Hematology and Oncology

## 2022-07-22 ENCOUNTER — Other Ambulatory Visit: Payer: Self-pay

## 2022-07-24 ENCOUNTER — Encounter (HOSPITAL_COMMUNITY): Payer: Self-pay | Admitting: Cardiovascular Disease

## 2022-07-24 ENCOUNTER — Other Ambulatory Visit: Payer: Self-pay

## 2022-07-24 ENCOUNTER — Ambulatory Visit (HOSPITAL_COMMUNITY): Payer: No Typology Code available for payment source | Admitting: General Practice

## 2022-07-24 ENCOUNTER — Encounter (HOSPITAL_COMMUNITY)
Admission: RE | Disposition: A | Discharge status: Home / Self Care | Payer: Self-pay | Source: Home / Self Care | Attending: Cardiovascular Disease

## 2022-07-24 ENCOUNTER — Ambulatory Visit (HOSPITAL_COMMUNITY)
Admission: RE | Admit: 2022-07-24 | Discharge: 2022-07-24 | Disposition: A | Discharge status: Home / Self Care | Payer: No Typology Code available for payment source | Attending: Cardiovascular Disease | Admitting: Cardiovascular Disease

## 2022-07-24 DIAGNOSIS — I4891 Unspecified atrial fibrillation: Secondary | ICD-10-CM | POA: Diagnosis not present

## 2022-07-24 DIAGNOSIS — Z538 Procedure and treatment not carried out for other reasons: Secondary | ICD-10-CM | POA: Insufficient documentation

## 2022-07-24 DIAGNOSIS — I340 Nonrheumatic mitral (valve) insufficiency: Secondary | ICD-10-CM

## 2022-07-24 SURGERY — CANCELLED PROCEDURE

## 2022-07-24 NOTE — H&P (Signed)
Procedure canceled. Presented in normal sinus rhythm.

## 2022-07-24 NOTE — Procedures (Incomplete)
Patient arrived in NSR.  EKG confirmed.  Dr. Sallyanne Kuster ordered discharge.  Patient discharged home.

## 2022-08-04 ENCOUNTER — Inpatient Hospital Stay: Payer: No Typology Code available for payment source

## 2022-08-04 ENCOUNTER — Encounter: Payer: No Typology Code available for payment source | Admitting: Nutrition

## 2022-08-04 VITALS — BP 162/81 | HR 67 | Temp 98.4°F | Resp 16 | Ht 77.0 in | Wt 211.5 lb

## 2022-08-04 DIAGNOSIS — C9000 Multiple myeloma not having achieved remission: Secondary | ICD-10-CM

## 2022-08-04 DIAGNOSIS — Z5112 Encounter for antineoplastic immunotherapy: Secondary | ICD-10-CM | POA: Diagnosis not present

## 2022-08-04 LAB — CMP (CANCER CENTER ONLY)
ALT: 17 U/L (ref 0–44)
AST: 23 U/L (ref 15–41)
Albumin: 4.2 g/dL (ref 3.5–5.0)
Alkaline Phosphatase: 51 U/L (ref 38–126)
Anion gap: 7 (ref 5–15)
BUN: 14 mg/dL (ref 8–23)
CO2: 27 mmol/L (ref 22–32)
Calcium: 9.1 mg/dL (ref 8.9–10.3)
Chloride: 108 mmol/L (ref 98–111)
Creatinine: 1.97 mg/dL — ABNORMAL HIGH (ref 0.61–1.24)
GFR, Estimated: 35 mL/min — ABNORMAL LOW (ref >60)
Glucose, Bld: 110 mg/dL — ABNORMAL HIGH (ref 70–99)
Potassium: 4.2 mmol/L (ref 3.5–5.1)
Sodium: 142 mmol/L (ref 135–145)
Total Bilirubin: 0.5 mg/dL (ref 0.3–1.2)
Total Protein: 6.2 g/dL — ABNORMAL LOW (ref 6.5–8.1)

## 2022-08-04 LAB — CBC WITH DIFFERENTIAL (CANCER CENTER ONLY)
Abs Immature Granulocytes: 0.01 10*3/uL (ref 0.00–0.07)
Basophils Absolute: 0 10*3/uL (ref 0.0–0.1)
Basophils Relative: 1 %
Eosinophils Absolute: 0.1 10*3/uL (ref 0.0–0.5)
Eosinophils Relative: 4 %
HCT: 36.1 % — ABNORMAL LOW (ref 39.0–52.0)
Hemoglobin: 11.9 g/dL — ABNORMAL LOW (ref 13.0–17.0)
Immature Granulocytes: 0 %
Lymphocytes Relative: 25 %
Lymphs Abs: 0.9 10*3/uL (ref 0.7–4.0)
MCH: 34.3 pg — ABNORMAL HIGH (ref 26.0–34.0)
MCHC: 33 g/dL (ref 30.0–36.0)
MCV: 104 fL — ABNORMAL HIGH (ref 80.0–100.0)
Monocytes Absolute: 0.6 10*3/uL (ref 0.1–1.0)
Monocytes Relative: 16 %
Neutro Abs: 1.9 10*3/uL (ref 1.7–7.7)
Neutrophils Relative %: 54 %
Platelet Count: 183 10*3/uL (ref 150–400)
RBC: 3.47 MIL/uL — ABNORMAL LOW (ref 4.22–5.81)
RDW: 14.6 % (ref 11.5–15.5)
WBC Count: 3.6 10*3/uL — ABNORMAL LOW (ref 4.0–10.5)
nRBC: 0 % (ref 0.0–0.2)

## 2022-08-04 LAB — LACTATE DEHYDROGENASE: LDH: 247 U/L — ABNORMAL HIGH (ref 98–192)

## 2022-08-04 MED ORDER — PALONOSETRON HCL INJECTION 0.25 MG/5ML
0.2500 mg | Freq: Once | INTRAVENOUS | Status: AC
  Administered 2022-08-04: 0.25 mg via INTRAVENOUS
  Filled 2022-08-04: qty 5

## 2022-08-04 MED ORDER — BORTEZOMIB CHEMO SQ INJECTION 3.5 MG (2.5MG/ML)
1.0000 mg/m2 | Freq: Once | INTRAMUSCULAR | Status: AC
  Administered 2022-08-04: 2.25 mg via SUBCUTANEOUS
  Filled 2022-08-04: qty 0.9

## 2022-08-04 MED ORDER — SODIUM CHLORIDE 0.9 % IV SOLN: Freq: Once | INTRAVENOUS | Status: AC

## 2022-08-04 MED ORDER — SODIUM CHLORIDE 0.9 % IV SOLN
200.0000 mg/m2 | Freq: Once | INTRAVENOUS | Status: AC
  Administered 2022-08-04: 480 mg via INTRAVENOUS
  Filled 2022-08-04: qty 24

## 2022-08-04 NOTE — Progress Notes (Signed)
Pt. states he took Decadron 40 mg po this am at home. Per Dr. [name], ok for treatment today with elevated serum creatine 1.97 mg/dL.

## 2022-08-04 NOTE — Patient Instructions (Signed)
[facility]  Discharge Instructions: Thank you for choosing [facility] to provide your oncology and hematology care.   If you have a lab appointment with the [facility], please go directly to the [facility] and check in at the registration area.   Wear comfortable clothing and clothing appropriate for easy access to any Portacath or PICC line.   We strive to give you quality time with your provider. You may need to reschedule your appointment if you arrive late (15 or more minutes).  Arriving late affects you and other patients whose appointments are after yours.  Also, if you miss three or more appointments without notifying the office, you may be dismissed from the clinic at the provider's discretion.      For prescription refill requests, have your pharmacy contact our office and allow 72 hours for refills to be completed.    Today you received the following chemotherapy and/or immunotherapy agents: Bortezomib (Velcade) and Cyclophosphamide (Cytoxan).   To help prevent nausea and vomiting after your treatment, we encourage you to take your nausea medication as directed.  BELOW ARE SYMPTOMS THAT SHOULD BE REPORTED IMMEDIATELY: *FEVER GREATER THAN 100.4 F (38 C) OR HIGHER *CHILLS OR SWEATING *NAUSEA AND VOMITING THAT IS NOT CONTROLLED WITH YOUR NAUSEA MEDICATION *UNUSUAL SHORTNESS OF BREATH *UNUSUAL BRUISING OR BLEEDING *URINARY PROBLEMS (pain or burning when urinating, or frequent urination) *BOWEL PROBLEMS (unusual diarrhea, constipation, pain near the anus) TENDERNESS IN MOUTH AND THROAT WITH OR WITHOUT PRESENCE OF ULCERS (sore throat, sores in mouth, or a toothache) UNUSUAL RASH, SWELLING OR PAIN  UNUSUAL VAGINAL DISCHARGE OR ITCHING   Items with * indicate a potential emergency and should be followed up as soon as possible or go to the Emergency Department if any problems should occur.  Please show the CHEMOTHERAPY ALERT CARD or  IMMUNOTHERAPY ALERT CARD at check-in to the Emergency Department and triage nurse.  Should you have questions after your visit or need to cancel or reschedule your appointment, please contact [facility]  Dept: [phone]  and follow the prompts.  Office hours are 8:00 a.m. to 4:30 p.m. Monday - Friday. Please note that voicemails left after 4:00 p.m. may not be returned until the following business day.  We are closed weekends and major holidays. You have access to a nurse at all times for urgent questions. Please call the main number to the clinic Dept: [phone] and follow the prompts.   For any non-urgent questions, you may also contact your provider using MyChart. We now offer e-Visits for anyone 40 and older to request care online for non-urgent symptoms. For details visit [email or web address].   Also download the MyChart app! Go to the app store, search "MyChart", open the app, select [facility], and log in with your MyChart username and password.  Masks are optional in the cancer centers. If you would like for your care team to wear a mask while they are taking care of you, please let them know. You may have one support person who is at least 77 years old accompany you for your appointments.

## 2022-08-05 LAB — KAPPA/LAMBDA LIGHT CHAINS
Kappa free light chain: 12.1 mg/L (ref 3.3–19.4)
Kappa, lambda light chain ratio: 2.16 — ABNORMAL HIGH (ref 0.26–1.65)
Lambda free light chains: 5.6 mg/L — ABNORMAL LOW (ref 5.7–26.3)

## 2022-08-06 LAB — MULTIPLE MYELOMA PANEL, SERUM
Albumin SerPl Elph-Mcnc: 3.7 g/dL (ref 2.9–4.4)
Albumin/Glob SerPl: 1.9 — ABNORMAL HIGH (ref 0.7–1.7)
Alpha 1: 0.2 g/dL (ref 0.0–0.4)
Alpha2 Glob SerPl Elph-Mcnc: 0.6 g/dL (ref 0.4–1.0)
B-Globulin SerPl Elph-Mcnc: 0.8 g/dL (ref 0.7–1.3)
Gamma Glob SerPl Elph-Mcnc: 0.4 g/dL (ref 0.4–1.8)
Globulin, Total: 2 g/dL — ABNORMAL LOW (ref 2.2–3.9)
IgA: 18 mg/dL — ABNORMAL LOW (ref 61–437)
IgG (Immunoglobin G), Serum: 465 mg/dL — ABNORMAL LOW (ref 603–1613)
IgM (Immunoglobulin M), Srm: 15 mg/dL (ref 15–143)
Total Protein ELP: 5.7 g/dL — ABNORMAL LOW (ref 6.0–8.5)

## 2022-08-07 ENCOUNTER — Other Ambulatory Visit: Payer: Self-pay

## 2022-08-11 ENCOUNTER — Inpatient Hospital Stay (HOSPITAL_BASED_OUTPATIENT_CLINIC_OR_DEPARTMENT_OTHER): Payer: No Typology Code available for payment source | Admitting: Hematology and Oncology

## 2022-08-11 ENCOUNTER — Other Ambulatory Visit: Payer: Self-pay

## 2022-08-11 ENCOUNTER — Encounter: Payer: No Typology Code available for payment source | Admitting: Nutrition

## 2022-08-11 ENCOUNTER — Encounter: Payer: Self-pay | Admitting: Hematology and Oncology

## 2022-08-11 ENCOUNTER — Other Ambulatory Visit (HOSPITAL_COMMUNITY): Payer: Self-pay | Admitting: Physician Assistant

## 2022-08-11 ENCOUNTER — Inpatient Hospital Stay: Payer: No Typology Code available for payment source | Attending: Hematology and Oncology

## 2022-08-11 ENCOUNTER — Inpatient Hospital Stay: Payer: No Typology Code available for payment source

## 2022-08-11 DIAGNOSIS — M79606 Pain in leg, unspecified: Secondary | ICD-10-CM | POA: Diagnosis not present

## 2022-08-11 DIAGNOSIS — Z5112 Encounter for antineoplastic immunotherapy: Secondary | ICD-10-CM | POA: Insufficient documentation

## 2022-08-11 DIAGNOSIS — C9000 Multiple myeloma not having achieved remission: Secondary | ICD-10-CM | POA: Insufficient documentation

## 2022-08-11 DIAGNOSIS — I341 Nonrheumatic mitral (valve) prolapse: Secondary | ICD-10-CM | POA: Diagnosis not present

## 2022-08-11 DIAGNOSIS — Z87891 Personal history of nicotine dependence: Secondary | ICD-10-CM | POA: Insufficient documentation

## 2022-08-11 DIAGNOSIS — Z7901 Long term (current) use of anticoagulants: Secondary | ICD-10-CM | POA: Insufficient documentation

## 2022-08-11 DIAGNOSIS — Z881 Allergy status to other antibiotic agents status: Secondary | ICD-10-CM | POA: Diagnosis not present

## 2022-08-11 DIAGNOSIS — Z5111 Encounter for antineoplastic chemotherapy: Secondary | ICD-10-CM | POA: Diagnosis present

## 2022-08-11 DIAGNOSIS — Z79899 Other long term (current) drug therapy: Secondary | ICD-10-CM | POA: Diagnosis not present

## 2022-08-11 DIAGNOSIS — Z8719 Personal history of other diseases of the digestive system: Secondary | ICD-10-CM | POA: Insufficient documentation

## 2022-08-11 DIAGNOSIS — Z809 Family history of malignant neoplasm, unspecified: Secondary | ICD-10-CM | POA: Insufficient documentation

## 2022-08-11 DIAGNOSIS — G629 Polyneuropathy, unspecified: Secondary | ICD-10-CM | POA: Insufficient documentation

## 2022-08-11 LAB — CBC WITH DIFFERENTIAL (CANCER CENTER ONLY)
Abs Immature Granulocytes: 0.02 10*3/uL (ref 0.00–0.07)
Basophils Absolute: 0 10*3/uL (ref 0.0–0.1)
Basophils Relative: 1 %
Eosinophils Absolute: 0.1 10*3/uL (ref 0.0–0.5)
Eosinophils Relative: 4 %
HCT: 37.6 % — ABNORMAL LOW (ref 39.0–52.0)
Hemoglobin: 12.5 g/dL — ABNORMAL LOW (ref 13.0–17.0)
Immature Granulocytes: 1 %
Lymphocytes Relative: 23 %
Lymphs Abs: 0.8 10*3/uL (ref 0.7–4.0)
MCH: 34.2 pg — ABNORMAL HIGH (ref 26.0–34.0)
MCHC: 33.2 g/dL (ref 30.0–36.0)
MCV: 103 fL — ABNORMAL HIGH (ref 80.0–100.0)
Monocytes Absolute: 0.5 10*3/uL (ref 0.1–1.0)
Monocytes Relative: 16 %
Neutro Abs: 2 10*3/uL (ref 1.7–7.7)
Neutrophils Relative %: 55 %
Platelet Count: 160 10*3/uL (ref 150–400)
RBC: 3.65 MIL/uL — ABNORMAL LOW (ref 4.22–5.81)
RDW: 14.3 % (ref 11.5–15.5)
WBC Count: 3.5 10*3/uL — ABNORMAL LOW (ref 4.0–10.5)
nRBC: 0 % (ref 0.0–0.2)

## 2022-08-11 LAB — CMP (CANCER CENTER ONLY)
ALT: 18 U/L (ref 0–44)
AST: 24 U/L (ref 15–41)
Albumin: 4.4 g/dL (ref 3.5–5.0)
Alkaline Phosphatase: 52 U/L (ref 38–126)
Anion gap: 7 (ref 5–15)
BUN: 16 mg/dL (ref 8–23)
CO2: 26 mmol/L (ref 22–32)
Calcium: 9.5 mg/dL (ref 8.9–10.3)
Chloride: 106 mmol/L (ref 98–111)
Creatinine: 1.81 mg/dL — ABNORMAL HIGH (ref 0.61–1.24)
GFR, Estimated: 38 mL/min — ABNORMAL LOW (ref >60)
Glucose, Bld: 120 mg/dL — ABNORMAL HIGH (ref 70–99)
Potassium: 3.9 mmol/L (ref 3.5–5.1)
Sodium: 139 mmol/L (ref 135–145)
Total Bilirubin: 0.6 mg/dL (ref 0.3–1.2)
Total Protein: 6.7 g/dL (ref 6.5–8.1)

## 2022-08-11 LAB — LACTATE DEHYDROGENASE: LDH: 241 U/L — ABNORMAL HIGH (ref 98–192)

## 2022-08-11 MED ORDER — SODIUM CHLORIDE 0.9 % IV SOLN
200.0000 mg/m2 | Freq: Once | INTRAVENOUS | Status: AC
  Administered 2022-08-11: 480 mg via INTRAVENOUS
  Filled 2022-08-11: qty 24

## 2022-08-11 MED ORDER — PALONOSETRON HCL INJECTION 0.25 MG/5ML
0.2500 mg | Freq: Once | INTRAVENOUS | Status: AC
  Administered 2022-08-11: 0.25 mg via INTRAVENOUS
  Filled 2022-08-11: qty 5

## 2022-08-11 MED ORDER — SODIUM CHLORIDE 0.9 % IV SOLN: Freq: Once | INTRAVENOUS | Status: AC

## 2022-08-11 MED ORDER — BORTEZOMIB CHEMO SQ INJECTION 3.5 MG (2.5MG/ML)
1.0000 mg/m2 | Freq: Once | INTRAMUSCULAR | Status: AC
  Administered 2022-08-11: 2.25 mg via SUBCUTANEOUS
  Filled 2022-08-11: qty 0.9

## 2022-08-11 NOTE — Progress Notes (Signed)
[name] Telephone:([phone]) [phone]   Fax:([phone]) [phone]  PROGRESS NOTE  Patient Care Team: [name], MD as PCP - General (Internal Medicine)  Hematological/Oncological History # IgA Lambda Multiple Myeloma 05/03/2015: last visit with Dr. Julien Nordmann at the [facility]. Was followed for IgA lambda MGUS. 12/11/2021: labs show M protein 3.4, Kappa 19.2, Lambda 1576.4, ratio 0.01. Cr 3.47, Hgb 8.6, WBC 5.7, MCV 99, Plt 221 12/23/2021: establish care with Dr. [name]  01/16/2022: Bone marrow biopsy performed, showed a 60% cellular bone marrow predominantly comprised of plasma cells making up 70 to 80%, lambda restricted.  Myeloma FISH panel showed no evidence of abnormalities. 02/07/2022: Cycle 1 Day 1 of CyBorD chemotherapy.  03/09/2022-03/17/2022: hospitalized for fever/pneumonia.  03/24/2022: Cycle 2 Day 1 of CyBorD chemotherapy.  04/22/2022: Cycle 3 Day 1 of CyBorD chemotherapy 05/19/2022-05/26/2022: Delayed start of Cycle 4 Day 1 of CyBorD chemotherapy due to neutropenia.  06/03/2022: Cycle 4 Day 1 of CyBorD chemotherapy. 25% dose reduction of cyclophosphamide due to cytopenias. 06/30/2022: Cycle 4 Day 22. Drop Velcade dose to 43m/m2 and Cyclophosphamide 200 mg/m2  07/07/2022: Cycle 5 Day 1 of CyBorD chemotherapy with dose reductions.   Interval History:  Darren Hinton 76y.o. male with medical history significant for IgA lambda multiple myeloma who presents for a follow up visit. The patient's last visit was on 07/21/2022. In the interim since the last visit he has continued on CyBorD chemotherapy.   On exam today Mr. WProutreports he had a good Thanksgiving holiday.  His appetite is strong and he is eating well.  He is scheduled to undergo physical therapy on 08/18/2022.  He reports he does continue to have some balance issues and some "heaviness" of his feet.  He reports the right foot is heavier than the left foot.  He has had a few episodes where he has had to go to  the floor but has not had any hard falls or has not hit his head.  He reports that he does have trouble dorsiflexing his foot.  He notes that his energy levels are good "in the upper half of my body".  He notes he is not having any nausea, vomiting, or diarrhea.  He is also not having any shortness of breath or chest pain.  He is tolerating his Eliquis well with no bleeding, bruising, or dark stools.  He otherwise denies any fevers, chills, sweats, nausea, vomiting or diarrhea.  A full 10 point ROS was otherwise negative.   MEDICAL HISTORY:  Past Medical History:  Diagnosis Date   Allergy    Anxiety    Arthritis    Depression    Heart murmur    Hyperlipidemia    Hyperplastic colon polyp    Hyperproteinemia    Hypertension    Hypothyroidism    Mitral valve prolapse    Paroxysmal atrial fibrillation (HCC)    Thyroid disease     SURGICAL HISTORY: Past Surgical History:  Procedure Laterality Date   CARDIOVERSION N/A 03/14/2022   Procedure: CARDIOVERSION;  Surgeon: HPixie Casino MD;  [facility]  Service: Cardiovascular;  Laterality: N/A;   TEE WITHOUT CARDIOVERSION N/A 03/14/2022   Procedure: TRANSESOPHAGEAL ECHOCARDIOGRAM (TEE);  Surgeon: HPixie Casino MD;  [facility]  Service: Cardiovascular;  Laterality: N/A;    SOCIAL HISTORY: Social History   Socioeconomic History   Marital status: Married    Spouse name: Not on file   Number of children: Not on file  Years of education: Not on file   Highest education level: Not on file  Occupational History   Not on file  Tobacco Use   Smoking status: Former    Years: 20.00    Types: Cigarettes    Quit date: 10/01/1978    Years since quitting: 43.8   Smokeless tobacco: Never   Tobacco comments:    Former smoker 04/03/22  Substance and Sexual Activity   Alcohol use: Yes    Alcohol/week: 2.0 standard drinks of alcohol    Types: 2 Glasses of wine per week    Comment: occasionally   Drug use: No    Sexual activity: Not on file  Other Topics Concern   Not on file  Social History Narrative   Not on file   Social Determinants of Health   Financial Resource Strain: Not on file  Food Insecurity: Not on file  Transportation Needs: Not on file  Physical Activity: Not on file  Stress: Not on file  Social Connections: Not on file  Intimate Partner Violence: Not on file    FAMILY HISTORY: Family History  Problem Relation Age of Onset   Cancer Father    Colon cancer Neg Hx    Esophageal cancer Neg Hx    Liver cancer Neg Hx    Pancreatic cancer Neg Hx    Rectal cancer Neg Hx    Stomach cancer Neg Hx     ALLERGIES:  is allergic to zithromax [azithromycin].  MEDICATIONS:  Current Outpatient Medications  Medication Sig Dispense Refill   acyclovir (ZOVIRAX) 400 MG tablet Take 1 tablet (400 mg total) by mouth 2 (two) times daily. 180 tablet 1   allopurinol (ZYLOPRIM) 300 MG tablet TAKE 1 TABLET BY MOUTH DAILY 100 tablet 2   ALPRAZolam (XANAX) 0.25 MG tablet Take 0.25 mg by mouth 2 (two) times daily as needed for anxiety.     amiodarone (PACERONE) 200 MG tablet Take 1 tablet (200 mg total) by mouth daily. 30 tablet 3   amLODipine (NORVASC) 5 MG tablet Take 1 tablet (5 mg total) by mouth daily. 30 tablet 2   apixaban (ELIQUIS) 5 MG TABS tablet Take 1 tablet (5 mg total) by mouth 2 (two) times daily. 180 tablet 1   atorvastatin (LIPITOR) 20 MG tablet Take 20 mg by mouth daily in the afternoon.     dexamethasone (DECADRON) 4 MG tablet Take 10 tablets (40 mg total) by mouth once a week. Take steroids pills the in the morning on chemotherapy days. 120 tablet 1   ergocalciferol (VITAMIN D2) 1.25 MG (50000 UT) capsule Take 50,000 Units by mouth every Wednesday.     ferrous gluconate (FERGON) 324 MG tablet Take 1 tablet (324 mg total) by mouth 3 (three) times daily with meals. 90 tablet 0   fluticasone (FLONASE) 50 MCG/ACT nasal spray Place 2 sprays into the nose daily as needed for  allergies.     gabapentin (NEURONTIN) 300 MG capsule Take 1 capsule (300 mg total) by mouth 2 (two) times daily. 60 capsule 1   levothyroxine (SYNTHROID) 100 MCG tablet Take 100 mcg by mouth See admin instructions. Take 1 tablet (100 mcg) by mouth on Mondays, Tuesdays, Wednesdays, Thursdays, Fridays & Saturdays in the morning. Skip a dose on Sundays.     loratadine (CLARITIN) 10 MG tablet Take 10 mg by mouth daily as needed for allergies.     meclizine (ANTIVERT) 25 MG tablet Take 1 tablet (25 mg total) by mouth 3 (three) times daily  as needed for dizziness. 30 tablet 0   metoprolol succinate (TOPROL-XL) 25 MG 24 hr tablet Take 1 tablet (25 mg total) by mouth daily. Take with or immediately following a meal. 30 tablet 3   prochlorperazine (COMPAZINE) 10 MG tablet Take 1 tablet (10 mg total) by mouth every 6 (six) hours as needed for nausea or vomiting. 30 tablet 0   senna-docusate (SENOKOT-S) 8.6-50 MG tablet Take 1 tablet by mouth at bedtime as needed for mild constipation.     traMADol (ULTRAM) 50 MG tablet Take 1 tablet (50 mg total) by mouth every 6 (six) hours as needed. 30 tablet 0   vitamin B-12 (CYANOCOBALAMIN) 500 MCG tablet Take 500 mcg by mouth in the morning.     Current Facility-Administered Medications  Medication Dose Route Frequency Provider Last Rate Last Admin   0.9 %  sodium chloride infusion  500 mL Intravenous Once Armbruster, Carlota Raspberry, MD        REVIEW OF SYSTEMS:   Constitutional: ( - ) fevers, ( - )  chills , ( - ) night sweats Eyes: ( - ) blurriness of vision, ( - ) double vision, ( - ) watery eyes Ears, nose, mouth, throat, and face: ( - ) mucositis, ( - ) sore throat Respiratory: ( - ) cough, ( - ) dyspnea, ( - ) wheezes Cardiovascular: ( - ) palpitation, ( - ) chest discomfort, ( - ) lower extremity swelling Gastrointestinal:  ( - ) nausea, ( - ) heartburn, ( - ) change in bowel habits Skin: ( - ) abnormal skin rashes Lymphatics: ( - ) new lymphadenopathy, ( - )  easy bruising Neurological: ( - ) numbness, ( - ) tingling, ( - ) new weaknesses Behavioral/Psych: ( - ) mood change, ( - ) new changes  All other systems were reviewed with the patient and are negative.  PHYSICAL EXAMINATION: ECOG PERFORMANCE STATUS: 1 - Symptomatic but completely ambulatory  Vitals:   08/11/22 0846  BP: (!) 151/73  Pulse: 65  Resp: 14  Temp: 97.7 F (36.5 C)  SpO2: 98%     Filed Weights   08/11/22 0846  Weight: 217 lb 1.6 oz (98.5 kg)      GENERAL: Chronically ill-appearing elderly African-American male, alert, no distress and comfortable SKIN: skin color, texture, turgor are normal, no rashes or significant lesions EYES: conjunctiva are pink and non-injected, sclera clear LUNGS: clear to auscultation and percussion with normal breathing effort HEART: regular rate & rhythm and no murmurs and no lower extremity edema Musculoskeletal: no cyanosis of digits and no clubbing  PSYCH: alert & oriented x 3, fluent speech NEURO: no focal motor/sensory deficits  LABORATORY DATA:  I have reviewed the data as listed    Latest Ref Rng & Units 08/11/2022    8:16 AM 08/04/2022    8:53 AM 07/21/2022    8:19 AM  CBC  WBC 4.0 - 10.5 K/uL 3.5  3.6  3.2   Hemoglobin 13.0 - 17.0 g/dL 12.5  11.9  12.2   Hematocrit 39.0 - 52.0 % 37.6  36.1  37.2   Platelets 150 - 400 K/uL 160  183  180        Latest Ref Rng & Units 08/11/2022    8:16 AM 08/04/2022    8:53 AM 07/21/2022    8:19 AM  CMP  Glucose 70 - 99 mg/dL 120  110  116   BUN 8 - 23 mg/dL 16  14  20  Creatinine 0.61 - 1.24 mg/dL 1.81  1.97  1.98   Sodium 135 - 145 mmol/L 139  142  139   Potassium 3.5 - 5.1 mmol/L 3.9  4.2  4.3   Chloride 98 - 111 mmol/L 106  108  105   CO2 22 - 32 mmol/L _0 Calcium 8.9 - 10.3 mg/dL 9.5  9.1  9.2   Total Protein 6.5 - 8.1 g/dL 6.7  6.2  6.6   Total Bilirubin 0.3 - 1.2 mg/dL 0.6  0.5  0.5   Alkaline Phos 38 - 126 U/L 52  51  52   AST 15 - 41 U/L _1 ALT  0 - 44 U/L _2 Lab Results  Component Value Date   MPROTEIN Not Observed 08/04/2022   MPROTEIN Not Observed 07/07/2022   MPROTEIN Not Observed 06/30/2022   Lab Results  Component Value Date   KPAFRELGTCHN 12.1 08/04/2022   KPAFRELGTCHN 13.8 07/07/2022   KPAFRELGTCHN 15.0 06/30/2022   LAMBDASER 5.6 (L) 08/04/2022   LAMBDASER 7.7 07/07/2022   LAMBDASER 9.2 06/30/2022   KAPLAMBRATIO 2.16 (H) 08/04/2022   KAPLAMBRATIO 1.79 (H) 07/07/2022   KAPLAMBRATIO 1.63 06/30/2022    RADIOGRAPHIC STUDIES: No results found.  ASSESSMENT & PLAN Asier Desroches. 76 y.o. male with medical history significant for IgA lambda multiple myeloma who presents for a follow up visit.   Previously we discussed the disease process of multiple myeloma.  We discussed the incurable nature of this disease and the nature of the treatment.  We discussed that this is a cancer of the bone marrow with plasma cells producing an abundance of M protein which in this patient's case causes his severe renal dysfunction.  Additionally we noted that myeloma can cause lytic lesions of the bone, anemia, and increased levels of calcium.  We discussed the treatment moving forward including doublet and triplet therapy.  Given the patient's good health overall, but poor kidney function I would recommend proceeding with CyBorD chemotherapy.  Mr. Tosi voices understanding of the treatment regimen and the plan moving forward.  At this time we will plan to proceed with Velcade 1.5 mg per metered squared subq weekly, cyclophosphamide 300 mg per metered squared IV weekly, and dexamethasone 20 mg p.o. weekly.  Cycles will consist of 4 weekly treatments and will continue for a minimum of 6 cycles before consideration of maintenance therapy.  Additionally if kidney function improves can consider transitioning regimen to VRD.    # IgA lambda Multiple Myeloma -- Bone marrow biopsy performed on 01/16/2022 showed increased plasma cells  consistent with a plasma cell neoplasm.  Normal FISH panel. --Patient meets diagnostic criteria based on kidney dysfunction and anemia. --Cycle 1 Day 1 of CyBorD chemotherapy started on 02/07/2022 Plan: --Most recent SPEP from 06/03/2022 showed M protein 0.0 g/dL, kappa 13.8, lambda 7.7, ratio 1.79. This is a robust response to therapy.  --today is Cycle 6 day 8 of CyBorD --previously dosage of velcade decreased to 69m/m2 and cyclophosphamide to 200 mg/m2 --Labs today show white blood cell 3.5, hemoglobin 12.5, MCV 103, and platelets of 160 --Cr 1.81, stable --RTC in 2 weeks with interval weekly treatments   # Leg Pain -- Likely a component of worsening neuropathy from his chemotherapy with pre-existing pain. -- continue gabapentin to 300 mg twice daily -- continue tramadol 50 mg every 6 hours as needed  #Supportive Care --  chemotherapy education complete -- port placement not required.  -- zofran 49m q8H PRN and compazine 181mPO q6H for nausea -- acyclovir 40055mO BID for VCZ prophylaxis -- allopurinol 300m29m daily for TLS prophylaxis --zometa to start after dental clearance.   No orders of the defined types were placed in this encounter.  All questions were answered. The patient knows to call the clinic with any problems, questions or concerns.  I have spent a total of 30 minutes minutes of face-to-face and non-face-to-face time, preparing to see the patient, performing a medically appropriate examination, counseling and educating the patient, documenting clinical information in the electronic health record,  and care coordination.   JohnLedell Peoples Department of Hematology/Oncology [facility]: [phone]: [phone]: [email or web address]  08/11/2022 9:20 AM

## 2022-08-11 NOTE — Patient Instructions (Signed)
[facility]  Discharge Instructions: Thank you for choosing [facility] to provide your oncology and hematology care.   If you have a lab appointment with the [facility], please go directly to the [facility] and check in at the registration area.   Wear comfortable clothing and clothing appropriate for easy access to any Portacath or PICC line.   We strive to give you quality time with your provider. You may need to reschedule your appointment if you arrive late (15 or more minutes).  Arriving late affects you and other patients whose appointments are after yours.  Also, if you miss three or more appointments without notifying the office, you may be dismissed from the clinic at the provider's discretion.      For prescription refill requests, have your pharmacy contact our office and allow 72 hours for refills to be completed.    Today you received the following chemotherapy and/or immunotherapy agents: Bortezomib (Velcade) and Cyclophosphamide (Cytoxan).   To help prevent nausea and vomiting after your treatment, we encourage you to take your nausea medication as directed.  BELOW ARE SYMPTOMS THAT SHOULD BE REPORTED IMMEDIATELY: *FEVER GREATER THAN 100.4 F (38 C) OR HIGHER *CHILLS OR SWEATING *NAUSEA AND VOMITING THAT IS NOT CONTROLLED WITH YOUR NAUSEA MEDICATION *UNUSUAL SHORTNESS OF BREATH *UNUSUAL BRUISING OR BLEEDING *URINARY PROBLEMS (pain or burning when urinating, or frequent urination) *BOWEL PROBLEMS (unusual diarrhea, constipation, pain near the anus) TENDERNESS IN MOUTH AND THROAT WITH OR WITHOUT PRESENCE OF ULCERS (sore throat, sores in mouth, or a toothache) UNUSUAL RASH, SWELLING OR PAIN  UNUSUAL VAGINAL DISCHARGE OR ITCHING   Items with * indicate a potential emergency and should be followed up as soon as possible or go to the Emergency Department if any problems should occur.  Please show the CHEMOTHERAPY ALERT CARD or  IMMUNOTHERAPY ALERT CARD at check-in to the Emergency Department and triage nurse.  Should you have questions after your visit or need to cancel or reschedule your appointment, please contact [facility]  Dept: [phone]  and follow the prompts.  Office hours are 8:00 a.m. to 4:30 p.m. Monday - Friday. Please note that voicemails left after 4:00 p.m. may not be returned until the following business day.  We are closed weekends and major holidays. You have access to a nurse at all times for urgent questions. Please call the main number to the clinic Dept: [phone] and follow the prompts.   For any non-urgent questions, you may also contact your provider using MyChart. We now offer e-Visits for anyone 49 and older to request care online for non-urgent symptoms. For details visit [email or web address].   Also download the MyChart app! Go to the app store, search "MyChart", open the app, select [facility], and log in with your MyChart username and password.  Masks are optional in the cancer centers. If you would like for your care team to wear a mask while they are taking care of you, please let them know. You may have one support person who is at least 77 years old accompany you for your appointments. Bortezomib Injection What is this medication? BORTEZOMIB (bor TEZ oh mib) treats lymphoma. It may also be used to treat multiple myeloma, a type of bone marrow cancer. It works by blocking a protein that causes cancer cells to grow and multiply. This helps to slow or stop the spread of cancer cells. This medicine may be used for other purposes;  ask your health care provider or pharmacist if you have questions. COMMON BRAND NAME(S): Velcade What should I tell my care team before I take this medication? They need to know if you have any of these conditions: Dehydration Diabetes Heart disease Liver disease Tingling of the fingers or toes or other nerve disorder An  unusual or allergic reaction to bortezomib, other medications, foods, dyes, or preservatives If you or your partner are pregnant or trying to get pregnant Breastfeeding How should I use this medication? This medication is injected into a vein or under the skin. It is given by your care team in a hospital or clinic setting. Talk to your care team about the use of this medication in children. Special care may be needed. Overdosage: If you think you have taken too much of this medicine contact a poison control center or emergency room at once. NOTE: This medicine is only for you. Do not share this medicine with others. What if I miss a dose? Keep appointments for follow-up doses. It is important not to miss your dose. Call your care team if you are unable to keep an appointment. What may interact with this medication? Ketoconazole Rifampin This list may not describe all possible interactions. Give your health care provider a list of all the medicines, herbs, non-prescription drugs, or dietary supplements you use. Also tell them if you smoke, drink alcohol, or use illegal drugs. Some items may interact with your medicine. What should I watch for while using this medication? Your condition will be monitored carefully while you are receiving this medication. You may need blood work while taking this medication. This medication may affect your coordination, reaction time, or judgment. Do not drive or operate machinery until you know how this medication affects you. Sit up or stand slowly to reduce the risk of dizzy or fainting spells. Drinking alcohol with this medication can increase the risk of these side effects. This medication may increase your risk of getting an infection. Call your care team for advice if you get a fever, chills, sore throat, or other symptoms of a cold or flu. Do not treat yourself. Try to avoid being around people who are sick. Check with your care team if you have severe  diarrhea, nausea, and vomiting, or if you sweat a lot. The loss of too much body fluid may make it dangerous for you to take this medication. Talk to your care team if you may be pregnant. Serious birth defects can occur if you take this medication during pregnancy and for 7 months after the last dose. You will need a negative pregnancy test before starting this medication. Contraception is recommended while taking this medication and for 7 months after the last dose. Your care team can help you find the option that works for you. If your partner can get pregnant, use a condom during sex while taking this medication and for 4 months after the last dose. Do not breastfeed while taking this medication and for 2 months after the last dose. This medication may cause infertility. Talk to your care team if you are concerned about your fertility. What side effects may I notice from receiving this medication? Side effects that you should report to your care team as soon as possible: Allergic reactions--skin rash, itching, hives, swelling of the face, lips, tongue, or throat Bleeding--bloody or black, tar-like stools, vomiting blood or brown material that looks like coffee grounds, red or dark brown urine, small red or purple spots on   skin, unusual bruising or bleeding Bleeding in the brain--severe headache, stiff neck, confusion, dizziness, change in vision, numbness or weakness of the face, arm, or leg, trouble speaking, trouble walking, vomiting Bowel blockage--stomach cramping, unable to have a bowel movement or pass gas, loss of appetite, vomiting Heart failure--shortness of breath, swelling of the ankles, feet, or hands, sudden weight gain, unusual weakness or fatigue Infection--fever, chills, cough, sore throat, wounds that don't heal, pain or trouble when passing urine, general feeling of discomfort or being unwell Liver injury--right upper belly pain, loss of appetite, nausea, light-colored stool, dark  yellow or brown urine, yellowing skin or eyes, unusual weakness or fatigue Low blood pressure--dizziness, feeling faint or lightheaded, blurry vision Lung injury--shortness of breath or trouble breathing, cough, spitting up blood, chest pain, fever Pain, tingling, or numbness in the hands or feet Severe or prolonged diarrhea Stomach pain, bloody diarrhea, pale skin, unusual weakness or fatigue, decrease in the amount of urine, which may be signs of hemolytic uremic syndrome Sudden and severe headache, confusion, change in vision, seizures, which may be signs of posterior reversible encephalopathy syndrome (PRES) TTP--purple spots on the skin or inside the mouth, pale skin, yellowing skin or eyes, unusual weakness or fatigue, fever, fast or irregular heartbeat, confusion, change in vision, trouble speaking, trouble walking Tumor lysis syndrome (TLS)--nausea, vomiting, diarrhea, decrease in the amount of urine, dark urine, unusual weakness or fatigue, confusion, muscle pain or cramps, fast or irregular heartbeat, joint pain Side effects that usually do not require medical attention (report to your care team if they continue or are bothersome): Constipation Diarrhea Fatigue Loss of appetite Nausea This list may not describe all possible side effects. Call your doctor for medical advice about side effects. You may report side effects to FDA at 1-800-FDA-1088. Where should I keep my medication? This medication is given in a hospital or clinic. It will not be stored at home. NOTE: This sheet is a summary. It may not cover all possible information. If you have questions about this medicine, talk to your doctor, pharmacist, or health care provider.  2023 Elsevier/Gold Standard (2022-01-22 00:00:00) Cyclophosphamide Injection What is this medication? CYCLOPHOSPHAMIDE (sye kloe FOSS fa mide) treats some types of cancer. It works by slowing down the growth of cancer cells. This medicine may be used for  other purposes; ask your health care provider or pharmacist if you have questions. COMMON BRAND NAME(S): Cyclophosphamide, Cytoxan, Neosar What should I tell my care team before I take this medication? They need to know if you have any of these conditions: Heart disease Irregular heartbeat or rhythm Infection Kidney problems Liver disease Low blood cell levels (white cells, platelets, or red blood cells) Lung disease Previous radiation Trouble passing urine An unusual or allergic reaction to cyclophosphamide, other medications, foods, dyes, or preservatives Pregnant or trying to get pregnant Breast-feeding How should I use this medication? This medication is injected into a vein. It is given by your care team in a hospital or clinic setting. Talk to your care team about the use of this medication in children. Special care may be needed. Overdosage: If you think you have taken too much of this medicine contact a poison control center or emergency room at once. NOTE: This medicine is only for you. Do not share this medicine with others. What if I miss a dose? Keep appointments for follow-up doses. It is important not to miss your dose. Call your care team if you are unable to keep an appointment. What  may interact with this medication? Amphotericin B Amiodarone Azathioprine Certain antivirals for HIV or hepatitis Certain medications for blood pressure, such as enalapril, lisinopril, quinapril Cyclosporine Diuretics Etanercept Indomethacin Medications that relax muscles Metronidazole Natalizumab Tamoxifen Warfarin This list may not describe all possible interactions. Give your health care provider a list of all the medicines, herbs, non-prescription drugs, or dietary supplements you use. Also tell them if you smoke, drink alcohol, or use illegal drugs. Some items may interact with your medicine. What should I watch for while using this medication? This medication may make you  feel generally unwell. This is not uncommon as chemotherapy can affect healthy cells as well as cancer cells. Report any side effects. Continue your course of treatment even though you feel ill unless your care team tells you to stop. You may need blood work while you are taking this medication. This medication may increase your risk of getting an infection. Call your care team for advice if you get a fever, chills, sore throat, or other symptoms of a cold or flu. Do not treat yourself. Try to avoid being around people who are sick. Avoid taking medications that contain aspirin, acetaminophen, ibuprofen, naproxen, or ketoprofen unless instructed by your care team. These medications may hide a fever. Be careful brushing or flossing your teeth or using a toothpick because you may get an infection or bleed more easily. If you have any dental work done, tell your dentist you are receiving this medication. Drink water or other fluids as directed. Urinate often, even at night. Some products may contain alcohol. Ask your care team if this medication contains alcohol. Be sure to tell all care teams you are taking this medicine. Certain medicines, like metronidazole and disulfiram, can cause an unpleasant reaction when taken with alcohol. The reaction includes flushing, headache, nausea, vomiting, sweating, and increased thirst. The reaction can last from 30 minutes to several hours. Talk to your care team if you wish to become pregnant or think you might be pregnant. This medication can cause serious birth defects if taken during pregnancy and for 1 year after the last dose. A negative pregnancy test is required before starting this medication. A reliable form of contraception is recommended while taking this medication and for 1 year after the last dose. Talk to your care team about reliable forms of contraception. Do not father a child while taking this medication and for 4 months after the last dose. Use a condom  during this time period. Do not breast-feed while taking this medication or for 1 week after the last dose. This medication may cause infertility. Talk to your care team if you are concerned about your fertility. Talk to your care team about your risk of cancer. You may be more at risk for certain types of cancer if you take this medication. What side effects may I notice from receiving this medication? Side effects that you should report to your care team as soon as possible: Allergic reactions--skin rash, itching, hives, swelling of the face, lips, tongue, or throat Dry cough, shortness of breath or trouble breathing Heart failure--shortness of breath, swelling of the ankles, feet, or hands, sudden weight gain, unusual weakness or fatigue Heart muscle inflammation--unusual weakness or fatigue, shortness of breath, chest pain, fast or irregular heartbeat, dizziness, swelling of the ankles, feet, or hands Heart rhythm changes--fast or irregular heartbeat, dizziness, feeling faint or lightheaded, chest pain, trouble breathing Infection--fever, chills, cough, sore throat, wounds that don't heal, pain or trouble when passing  urine, general feeling of discomfort or being unwell Kidney injury--decrease in the amount of urine, swelling of the ankles, hands, or feet Liver injury--right upper belly pain, loss of appetite, nausea, light-colored stool, dark yellow or brown urine, yellowing skin or eyes, unusual weakness or fatigue Low red blood cell level--unusual weakness or fatigue, dizziness, headache, trouble breathing Low sodium level--muscle weakness, fatigue, dizziness, headache, confusion Red or dark brown urine Unusual bruising or bleeding Side effects that usually do not require medical attention (report to your care team if they continue or are bothersome): Hair loss Irregular menstrual cycles or spotting Loss of appetite Nausea Pain, redness, or swelling with sores inside the mouth or  throat Vomiting This list may not describe all possible side effects. Call your doctor for medical advice about side effects. You may report side effects to FDA at 1-800-FDA-1088. Where should I keep my medication? This medication is given in a hospital or clinic. It will not be stored at home. NOTE: This sheet is a summary. It may not cover all possible information. If you have questions about this medicine, talk to your doctor, pharmacist, or health care provider.  2023 Elsevier/Gold Standard (2021-10-15 00:00:00)

## 2022-08-11 NOTE — Progress Notes (Signed)
Per Dr. [name], ok for treatment with elevated serum creatine 1.81 mg/dL. Pt. states he took Decadron 40 mg po this am at home.

## 2022-08-13 ENCOUNTER — Other Ambulatory Visit: Payer: Self-pay

## 2022-08-14 ENCOUNTER — Other Ambulatory Visit: Payer: Self-pay | Admitting: Hematology and Oncology

## 2022-08-15 ENCOUNTER — Telehealth: Payer: Self-pay | Admitting: Hematology and Oncology

## 2022-08-15 NOTE — Telephone Encounter (Signed)
R/s per 12/8 secure chat w charge RN, message has been left with pt

## 2022-08-18 ENCOUNTER — Inpatient Hospital Stay: Payer: No Typology Code available for payment source

## 2022-08-18 ENCOUNTER — Ambulatory Visit: Payer: No Typology Code available for payment source

## 2022-08-18 ENCOUNTER — Other Ambulatory Visit: Payer: No Typology Code available for payment source

## 2022-08-18 VITALS — BP 150/80 | HR 62 | Temp 98.4°F | Resp 17 | Wt 227.2 lb

## 2022-08-18 DIAGNOSIS — C9000 Multiple myeloma not having achieved remission: Secondary | ICD-10-CM

## 2022-08-18 DIAGNOSIS — Z5112 Encounter for antineoplastic immunotherapy: Secondary | ICD-10-CM | POA: Diagnosis not present

## 2022-08-18 LAB — CBC WITH DIFFERENTIAL (CANCER CENTER ONLY)
Abs Immature Granulocytes: 0.03 10*3/uL (ref 0.00–0.07)
Basophils Absolute: 0 10*3/uL (ref 0.0–0.1)
Basophils Relative: 1 %
Eosinophils Absolute: 0.2 10*3/uL (ref 0.0–0.5)
Eosinophils Relative: 4 %
HCT: 36.7 % — ABNORMAL LOW (ref 39.0–52.0)
Hemoglobin: 12.2 g/dL — ABNORMAL LOW (ref 13.0–17.0)
Immature Granulocytes: 1 %
Lymphocytes Relative: 18 %
Lymphs Abs: 0.7 10*3/uL (ref 0.7–4.0)
MCH: 34.4 pg — ABNORMAL HIGH (ref 26.0–34.0)
MCHC: 33.2 g/dL (ref 30.0–36.0)
MCV: 103.4 fL — ABNORMAL HIGH (ref 80.0–100.0)
Monocytes Absolute: 0.5 10*3/uL (ref 0.1–1.0)
Monocytes Relative: 14 %
Neutro Abs: 2.4 10*3/uL (ref 1.7–7.7)
Neutrophils Relative %: 62 %
Platelet Count: 164 10*3/uL (ref 150–400)
RBC: 3.55 MIL/uL — ABNORMAL LOW (ref 4.22–5.81)
RDW: 14.2 % (ref 11.5–15.5)
WBC Count: 3.8 10*3/uL — ABNORMAL LOW (ref 4.0–10.5)
nRBC: 0 % (ref 0.0–0.2)

## 2022-08-18 LAB — CMP (CANCER CENTER ONLY)
ALT: 16 U/L (ref 0–44)
AST: 22 U/L (ref 15–41)
Albumin: 4 g/dL (ref 3.5–5.0)
Alkaline Phosphatase: 54 U/L (ref 38–126)
Anion gap: 6 (ref 5–15)
BUN: 12 mg/dL (ref 8–23)
CO2: 26 mmol/L (ref 22–32)
Calcium: 9 mg/dL (ref 8.9–10.3)
Chloride: 107 mmol/L (ref 98–111)
Creatinine: 1.66 mg/dL — ABNORMAL HIGH (ref 0.61–1.24)
GFR, Estimated: 42 mL/min — ABNORMAL LOW (ref >60)
Glucose, Bld: 106 mg/dL — ABNORMAL HIGH (ref 70–99)
Potassium: 4.1 mmol/L (ref 3.5–5.1)
Sodium: 139 mmol/L (ref 135–145)
Total Bilirubin: 0.6 mg/dL (ref 0.3–1.2)
Total Protein: 6.1 g/dL — ABNORMAL LOW (ref 6.5–8.1)

## 2022-08-18 LAB — LACTATE DEHYDROGENASE: LDH: 228 U/L — ABNORMAL HIGH (ref 98–192)

## 2022-08-18 MED ORDER — PALONOSETRON HCL INJECTION 0.25 MG/5ML
0.2500 mg | Freq: Once | INTRAVENOUS | Status: AC
  Administered 2022-08-18: 0.25 mg via INTRAVENOUS
  Filled 2022-08-18: qty 5

## 2022-08-18 MED ORDER — BORTEZOMIB CHEMO SQ INJECTION 3.5 MG (2.5MG/ML)
1.0000 mg/m2 | Freq: Once | INTRAMUSCULAR | Status: AC
  Administered 2022-08-18: 2.25 mg via SUBCUTANEOUS
  Filled 2022-08-18: qty 0.9

## 2022-08-18 MED ORDER — SODIUM CHLORIDE 0.9 % IV SOLN: Freq: Once | INTRAVENOUS | Status: AC

## 2022-08-18 MED ORDER — SODIUM CHLORIDE 0.9 % IV SOLN
200.0000 mg/m2 | Freq: Once | INTRAVENOUS | Status: AC
  Administered 2022-08-18: 480 mg via INTRAVENOUS
  Filled 2022-08-18: qty 24

## 2022-08-18 NOTE — Progress Notes (Signed)
Per Joesphine Bare, Dr [name] ok to treat with creat 1.66 mg/dL

## 2022-08-18 NOTE — Patient Instructions (Signed)
[facility]  Discharge Instructions: Thank you for choosing [facility] to provide your oncology and hematology care.   If you have a lab appointment with the [facility], please go directly to the [facility] and check in at the registration area.   Wear comfortable clothing and clothing appropriate for easy access to any Portacath or PICC line.   We strive to give you quality time with your provider. You may need to reschedule your appointment if you arrive late (15 or more minutes).  Arriving late affects you and other patients whose appointments are after yours.  Also, if you miss three or more appointments without notifying the office, you may be dismissed from the clinic at the provider's discretion.      For prescription refill requests, have your pharmacy contact our office and allow 72 hours for refills to be completed.    Today you received the following chemotherapy and/or immunotherapy agents: Velcade and Cytoxan      To help prevent nausea and vomiting after your treatment, we encourage you to take your nausea medication as directed.  BELOW ARE SYMPTOMS THAT SHOULD BE REPORTED IMMEDIATELY: *FEVER GREATER THAN 100.4 F (38 C) OR HIGHER *CHILLS OR SWEATING *NAUSEA AND VOMITING THAT IS NOT CONTROLLED WITH YOUR NAUSEA MEDICATION *UNUSUAL SHORTNESS OF BREATH *UNUSUAL BRUISING OR BLEEDING *URINARY PROBLEMS (pain or burning when urinating, or frequent urination) *BOWEL PROBLEMS (unusual diarrhea, constipation, pain near the anus) TENDERNESS IN MOUTH AND THROAT WITH OR WITHOUT PRESENCE OF ULCERS (sore throat, sores in mouth, or a toothache) UNUSUAL RASH, SWELLING OR PAIN  UNUSUAL VAGINAL DISCHARGE OR ITCHING   Items with * indicate a potential emergency and should be followed up as soon as possible or go to the Emergency Department if any problems should occur.  Please show the CHEMOTHERAPY ALERT CARD or IMMUNOTHERAPY ALERT CARD at  check-in to the Emergency Department and triage nurse.  Should you have questions after your visit or need to cancel or reschedule your appointment, please contact [facility]  Dept: [phone]  and follow the prompts.  Office hours are 8:00 a.m. to 4:30 p.m. Monday - Friday. Please note that voicemails left after 4:00 p.m. may not be returned until the following business day.  We are closed weekends and major holidays. You have access to a nurse at all times for urgent questions. Please call the main number to the clinic Dept: [phone] and follow the prompts.   For any non-urgent questions, you may also contact your provider using MyChart. We now offer e-Visits for anyone 46 and older to request care online for non-urgent symptoms. For details visit [email or web address].   Also download the MyChart app! Go to the app store, search "MyChart", open the app, select [facility], and log in with your MyChart username and password.  Masks are optional in the cancer centers. If you would like for your care team to wear a mask while they are taking care of you, please let them know. You may have one support person who is at least 77 years old accompany you for your appointments.

## 2022-08-21 ENCOUNTER — Other Ambulatory Visit: Payer: Self-pay | Admitting: Hematology and Oncology

## 2022-08-24 NOTE — Progress Notes (Unsigned)
[place] [phone]   [phone]  PROGRESS NOTE  Patient Care Team: [name], MD as PCP - General (Internal Medicine)  Hematological/Oncological History # IgA Lambda Multiple Myeloma 05/03/2015: last visit with Dr. Julien Nordmann at the [facility]. Was followed for IgA lambda MGUS. 12/11/2021: labs show M protein 3.4, Kappa 19.2, Lambda 1576.4, ratio 0.01. Cr 3.47, Hgb 8.6, WBC 5.7, MCV 99, Plt 221 12/23/2021: establish care with Dr. [name]  01/16/2022: Bone marrow biopsy performed, showed a 60% cellular bone marrow predominantly comprised of plasma cells making up 70 to 80%, lambda restricted.  Myeloma FISH panel showed no evidence of abnormalities. 02/07/2022: Cycle 1 Day 1 of CyBorD chemotherapy.  03/09/2022-03/17/2022: hospitalized for fever/pneumonia.  03/24/2022: Cycle 2 Day 1 of CyBorD chemotherapy.  04/22/2022: Cycle 3 Day 1 of CyBorD chemotherapy 05/19/2022-05/26/2022: Delayed start of Cycle 4 Day 1 of CyBorD chemotherapy due to neutropenia.  06/03/2022: Cycle 4 Day 1 of CyBorD chemotherapy. 25% dose reduction of cyclophosphamide due to cytopenias. 06/30/2022: Cycle 4 Day 22. Drop Velcade dose to 75m/m2 and Cyclophosphamide 200 mg/m2  07/07/2022: Cycle 5 Day 1 of CyBorD chemotherapy with dose reductions.  08/04/2022: Cycle 6 Day 1 of CyBorD chemotherapy with dose reductions.  09/09/2022: transition to maintenance dose Velcade q 2 weeks.   Interval History:  Darren Hinton 76y.o. male with medical history significant for IgA lambda multiple myeloma who presents for a follow up visit. The patient's last visit was on 08/11/2022. In the interim since the last visit he has continued on CyBorD chemotherapy.   On exam today Mr. WEdmisterreports he has been feeling good in the interim since her last visit.  He reports the treatment is been steady and he is developed no new side effects.  He reports that he is eating well and his appetite is strong.  He  [facility].  He notes that predated his chemotherapy treatment but that it has worsened throughout the course.  He reports otherwise he has no questions concerns or complaints.  He is willing and able to proceed with treatment today.  He is tolerating his Eliquis well with no bleeding, bruising, or dark stools.  He denies any fevers, chills, sweats, nausea, vomiting or diarrhea.  A full 10 point ROS was otherwise negative.   MEDICAL HISTORY:  Past Medical History:  Diagnosis Date   Allergy    Anxiety    Arthritis    Depression    Heart murmur    Hyperlipidemia    Hyperplastic colon polyp    Hyperproteinemia    Hypertension    Hypothyroidism    Mitral valve prolapse    Paroxysmal atrial fibrillation (HCC)    Thyroid disease     SURGICAL HISTORY: Past Surgical History:  Procedure Laterality Date   CARDIOVERSION N/A 03/14/2022   Procedure: CARDIOVERSION;  Surgeon: HPixie Casino MD;  [place] [facility]  Service: Cardiovascular;  Laterality: N/A;   TEE WITHOUT CARDIOVERSION N/A 03/14/2022   Procedure: TRANSESOPHAGEAL ECHOCARDIOGRAM (TEE);  Surgeon: HPixie Casino MD;  [facility]  Service: Cardiovascular;  Laterality: N/A;    SOCIAL HISTORY: Social History   Socioeconomic History   Marital status: Married    Spouse name: Not on file   Number of children: Not on file   Years of education: Not on file   Highest education level: Not on file  Occupational History   Not on  file  Tobacco Use   Smoking status: Former    Years: 20.00    Types: Cigarettes    Quit date: 10/01/1978    Years since quitting: 43.9   Smokeless tobacco: Never   Tobacco comments:    Former smoker 04/03/22  Substance and Sexual Activity   Alcohol use: Yes    Alcohol/week: 2.0 standard drinks of alcohol    Types: 2 Glasses of wine per week    Comment: occasionally   Drug use: No   Sexual activity: Not on file  Other  Topics Concern   Not on file  Social History Narrative   Not on file   Social Determinants of Health   Financial Resource Strain: Not on file  Food Insecurity: Not on file  Transportation Needs: Not on file  Physical Activity: Not on file  Stress: Not on file  Social Connections: Not on file  Intimate Partner Violence: Not on file    FAMILY HISTORY: Family History  Problem Relation Age of Onset   Cancer Father    Colon cancer Neg Hx    Esophageal cancer Neg Hx    Liver cancer Neg Hx    Pancreatic cancer Neg Hx    Rectal cancer Neg Hx    Stomach cancer Neg Hx     ALLERGIES:  is allergic to zithromax [azithromycin].  MEDICATIONS:  Current Outpatient Medications  Medication Sig Dispense Refill   acyclovir (ZOVIRAX) 400 MG tablet TAKE 1 TABLET BY MOUTH TWICE A DAY 180 tablet 1   allopurinol (ZYLOPRIM) 300 MG tablet TAKE 1 TABLET BY MOUTH DAILY 100 tablet 2   ALPRAZolam (XANAX) 0.25 MG tablet Take 0.25 mg by mouth 2 (two) times daily as needed for anxiety.     amiodarone (PACERONE) 200 MG tablet TAKE 1 TABLET BY MOUTH EVERY DAY 90 tablet 3   amLODipine (NORVASC) 5 MG tablet TAKE 1 TABLET (5 MG TOTAL) BY MOUTH DAILY. 90 tablet 3   apixaban (ELIQUIS) 5 MG TABS tablet Take 1 tablet (5 mg total) by mouth 2 (two) times daily. 180 tablet 1   atorvastatin (LIPITOR) 20 MG tablet Take 20 mg by mouth daily in the afternoon.     dexamethasone (DECADRON) 4 MG tablet TAKE 10 TABLETS (40 MG TOTAL) BY MOUTH ONCE A WEEK IN THE MORNING ON CHEMOTHERAPY DAYS 120 tablet 1   ergocalciferol (VITAMIN D2) 1.25 MG (50000 UT) capsule Take 50,000 Units by mouth every Wednesday.     ferrous gluconate (FERGON) 324 MG tablet Take 1 tablet (324 mg total) by mouth 3 (three) times daily with meals. 90 tablet 0   fluticasone (FLONASE) 50 MCG/ACT nasal spray Place 2 sprays into the nose daily as needed for allergies.     gabapentin (NEURONTIN) 300 MG capsule Take 1 capsule (300 mg total) by mouth 2 (two) times  daily. 60 capsule 1   levothyroxine (SYNTHROID) 100 MCG tablet Take 100 mcg by mouth See admin instructions. Take 1 tablet (100 mcg) by mouth on Mondays, Tuesdays, Wednesdays, Thursdays, Fridays & Saturdays in the morning. Skip a dose on Sundays.     loratadine (CLARITIN) 10 MG tablet Take 10 mg by mouth daily as needed for allergies.     meclizine (ANTIVERT) 25 MG tablet Take 1 tablet (25 mg total) by mouth 3 (three) times daily as needed for dizziness. 30 tablet 0   metoprolol succinate (TOPROL-XL) 25 MG 24 hr tablet Take 1 tablet (25 mg total) by mouth daily. Take with or immediately following  a meal. 30 tablet 3   prochlorperazine (COMPAZINE) 10 MG tablet Take 1 tablet (10 mg total) by mouth every 6 (six) hours as needed for nausea or vomiting. 30 tablet 0   senna-docusate (SENOKOT-S) 8.6-50 MG tablet Take 1 tablet by mouth at bedtime as needed for mild constipation.     traMADol (ULTRAM) 50 MG tablet Take 1 tablet (50 mg total) by mouth every 6 (six) hours as needed. 30 tablet 0   vitamin B-12 (CYANOCOBALAMIN) 500 MCG tablet Take 500 mcg by mouth in the morning.     Current Facility-Administered Medications  Medication Dose Route Frequency Provider Last Rate Last Admin   0.9 %  sodium chloride infusion  500 mL Intravenous Once Armbruster, Carlota Raspberry, MD        REVIEW OF SYSTEMS:   Constitutional: ( - ) fevers, ( - )  chills , ( - ) night sweats Eyes: ( - ) blurriness of vision, ( - ) double vision, ( - ) watery eyes Ears, nose, mouth, throat, and face: ( - ) mucositis, ( - ) sore throat Respiratory: ( - ) cough, ( - ) dyspnea, ( - ) wheezes Cardiovascular: ( - ) palpitation, ( - ) chest discomfort, ( - ) lower extremity swelling Gastrointestinal:  ( - ) nausea, ( - ) heartburn, ( - ) change in bowel habits Skin: ( - ) abnormal skin rashes Lymphatics: ( - ) new lymphadenopathy, ( - ) easy bruising Neurological: ( - ) numbness, ( - ) tingling, ( - ) new weaknesses Behavioral/Psych: ( - )  mood change, ( - ) new changes  All other systems were reviewed with the patient and are negative.  PHYSICAL EXAMINATION: ECOG PERFORMANCE STATUS: 1 - Symptomatic but completely ambulatory  Vitals:   08/25/22 0925  BP: (!) 161/88  Pulse: (!) 58  Resp: 14  Temp: 97.7 F (36.5 C)  SpO2: 96%      Filed Weights   08/25/22 0925  Weight: 228 lb 4.8 oz (103.6 kg)    GENERAL: Chronically ill-appearing elderly African-American male, alert, no distress and comfortable SKIN: skin color, texture, turgor are normal, no rashes or significant lesions EYES: conjunctiva are pink and non-injected, sclera clear LUNGS: clear to auscultation and percussion with normal breathing effort HEART: regular rate & rhythm and no murmurs and +3 bilateral lower extremity edema Musculoskeletal: no cyanosis of digits and no clubbing  PSYCH: alert & oriented x 3, fluent speech NEURO: no focal motor/sensory deficits  LABORATORY DATA:  I have reviewed the data as listed    Latest Ref Rng & Units 08/25/2022    8:55 AM 08/18/2022    8:09 AM 08/11/2022    8:16 AM  CBC  WBC 4.0 - 10.5 K/uL 3.8  3.8  3.5   Hemoglobin 13.0 - 17.0 g/dL 11.7  12.2  12.5   Hematocrit 39.0 - 52.0 % 34.9  36.7  37.6   Platelets 150 - 400 K/uL 174  164  160        Latest Ref Rng & Units 08/25/2022    8:55 AM 08/18/2022    8:09 AM 08/11/2022    8:16 AM  CMP  Glucose 70 - 99 mg/dL 112  106  120   BUN 8 - 23 mg/dL _0 Creatinine 0.61 - 1.24 mg/dL 1.87  1.66  1.81   Sodium 135 - 145 mmol/L 140  139  139   Potassium 3.5 - 5.1 mmol/L 4.1  4.1  3.9   Chloride 98 - 111 mmol/L 106  107  106   CO2 22 - 32 mmol/L _0 Calcium 8.9 - 10.3 mg/dL 9.5  9.0  9.5   Total Protein 6.5 - 8.1 g/dL 5.8  6.1  6.7   Total Bilirubin 0.3 - 1.2 mg/dL 0.6  0.6  0.6   Alkaline Phos 38 - 126 U/L 57  54  52   AST 15 - 41 U/L _1 ALT 0 - 44 U/L _2 Lab Results  Component Value Date   MPROTEIN Not Observed  08/04/2022   MPROTEIN Not Observed 07/07/2022   MPROTEIN Not Observed 06/30/2022   Lab Results  Component Value Date   KPAFRELGTCHN 12.1 08/04/2022   KPAFRELGTCHN 13.8 07/07/2022   KPAFRELGTCHN 15.0 06/30/2022   LAMBDASER 5.6 (L) 08/04/2022   LAMBDASER 7.7 07/07/2022   LAMBDASER 9.2 06/30/2022   KAPLAMBRATIO 2.16 (H) 08/04/2022   KAPLAMBRATIO 1.79 (H) 07/07/2022   KAPLAMBRATIO 1.63 06/30/2022    RADIOGRAPHIC STUDIES: No results found.  ASSESSMENT & PLAN Loyalty Brashier. 76 y.o. male with medical history significant for IgA lambda multiple myeloma who presents for a follow up visit.   Previously we discussed the disease process of multiple myeloma.  We discussed the incurable nature of this disease and the nature of the treatment.  We discussed that this is a cancer of the bone marrow with plasma cells producing an abundance of M protein which in this patient's case causes his severe renal dysfunction.  Additionally we noted that myeloma can cause lytic lesions of the bone, anemia, and increased levels of calcium.  We discussed the treatment moving forward including doublet and triplet therapy.  Given the patient's good health overall, but poor kidney function I would recommend proceeding with CyBorD chemotherapy.  Mr. Whetsel voices understanding of the treatment regimen and the plan moving forward.  At this time we will plan to proceed with Velcade 1.5 mg per metered squared subq weekly, cyclophosphamide 300 mg per metered squared IV weekly, and dexamethasone 20 mg p.o. weekly.  Cycles will consist of 4 weekly treatments and will continue for a minimum of 6 cycles before consideration of maintenance therapy.  Additionally if kidney function improves can consider transitioning regimen to VRD.    # IgA lambda Multiple Myeloma -- Bone marrow biopsy performed on 01/16/2022 showed increased plasma cells consistent with a plasma cell neoplasm.  Normal FISH panel. --Patient meets diagnostic  criteria based on kidney dysfunction and anemia. --Cycle 1 Day 1 of CyBorD chemotherapy started on 02/07/2022 Plan: --Most recent SPEP from 08/04/2022 showed M protein 0.0 g/dL, kappa 12.1, lambda 5.6, ratio 2.16. This is a robust response to therapy.  --today is Cycle 6 day 22 of CyBorD --previously dosage of velcade decreased to 43m/m2 and cyclophosphamide to 200 mg/m2 --Labs today show white blood cell 3.8, hemoglobin 9.7, MCV 103.9, and platelets of 174. --Patient has completed 6 cycles of CyBorD and has a VGPR.  As such I think it be reasonable to transition to maintenance therapy.  Will plan for Velcade every 2 weeks with monthly clinic visits. --RTC in 4 weeks with interval q 2 week monotherapy maintenance Velcade treatments   # Leg Pain -- Likely a component of worsening neuropathy from his chemotherapy with pre-existing pain. -- continue gabapentin to 300 mg twice daily -- continue tramadol 50 mg every 6  hours as needed  #Supportive Care -- chemotherapy education complete -- port placement not required.  -- zofran 65m q8H PRN and compazine 167mPO q6H for nausea -- acyclovir 4007mO BID for VCZ prophylaxis -- allopurinol 300m45m daily for TLS prophylaxis --zometa to start after dental clearance.   No orders of the defined types were placed in this encounter.  All questions were answered. The patient knows to call the clinic with any problems, questions or concerns.  I have spent a total of 30 minutes minutes of face-to-face and non-face-to-face time, preparing to see the patient, performing a medically appropriate examination, counseling and educating the patient, documenting clinical information in the electronic health record,  and care coordination.   JohnLedell Peoples Department of Hematology/Oncology [facility]: [phone]: [phone]: [email or web address]  08/25/2022 9:59 AM

## 2022-08-25 ENCOUNTER — Inpatient Hospital Stay: Payer: No Typology Code available for payment source

## 2022-08-25 ENCOUNTER — Inpatient Hospital Stay (HOSPITAL_BASED_OUTPATIENT_CLINIC_OR_DEPARTMENT_OTHER): Payer: No Typology Code available for payment source | Admitting: Hematology and Oncology

## 2022-08-25 VITALS — BP 161/88 | HR 58 | Temp 97.7°F | Resp 14 | Wt 228.3 lb

## 2022-08-25 DIAGNOSIS — D631 Anemia in chronic kidney disease: Secondary | ICD-10-CM

## 2022-08-25 DIAGNOSIS — C9000 Multiple myeloma not having achieved remission: Secondary | ICD-10-CM

## 2022-08-25 DIAGNOSIS — N184 Chronic kidney disease, stage 4 (severe): Secondary | ICD-10-CM | POA: Diagnosis not present

## 2022-08-25 DIAGNOSIS — Z5112 Encounter for antineoplastic immunotherapy: Secondary | ICD-10-CM | POA: Diagnosis not present

## 2022-08-25 LAB — CMP (CANCER CENTER ONLY)
ALT: 17 U/L (ref 0–44)
AST: 23 U/L (ref 15–41)
Albumin: 4 g/dL (ref 3.5–5.0)
Alkaline Phosphatase: 57 U/L (ref 38–126)
Anion gap: 6 (ref 5–15)
BUN: 16 mg/dL (ref 8–23)
CO2: 28 mmol/L (ref 22–32)
Calcium: 9.5 mg/dL (ref 8.9–10.3)
Chloride: 106 mmol/L (ref 98–111)
Creatinine: 1.87 mg/dL — ABNORMAL HIGH (ref 0.61–1.24)
GFR, Estimated: 37 mL/min — ABNORMAL LOW (ref >60)
Glucose, Bld: 112 mg/dL — ABNORMAL HIGH (ref 70–99)
Potassium: 4.1 mmol/L (ref 3.5–5.1)
Sodium: 140 mmol/L (ref 135–145)
Total Bilirubin: 0.6 mg/dL (ref 0.3–1.2)
Total Protein: 5.8 g/dL — ABNORMAL LOW (ref 6.5–8.1)

## 2022-08-25 LAB — CBC WITH DIFFERENTIAL (CANCER CENTER ONLY)
Abs Immature Granulocytes: 0.02 10*3/uL (ref 0.00–0.07)
Basophils Absolute: 0 10*3/uL (ref 0.0–0.1)
Basophils Relative: 0 %
Eosinophils Absolute: 0.2 10*3/uL (ref 0.0–0.5)
Eosinophils Relative: 5 %
HCT: 34.9 % — ABNORMAL LOW (ref 39.0–52.0)
Hemoglobin: 11.7 g/dL — ABNORMAL LOW (ref 13.0–17.0)
Immature Granulocytes: 1 %
Lymphocytes Relative: 15 %
Lymphs Abs: 0.6 10*3/uL — ABNORMAL LOW (ref 0.7–4.0)
MCH: 34.8 pg — ABNORMAL HIGH (ref 26.0–34.0)
MCHC: 33.5 g/dL (ref 30.0–36.0)
MCV: 103.9 fL — ABNORMAL HIGH (ref 80.0–100.0)
Monocytes Absolute: 0.6 10*3/uL (ref 0.1–1.0)
Monocytes Relative: 16 %
Neutro Abs: 2.4 10*3/uL (ref 1.7–7.7)
Neutrophils Relative %: 63 %
Platelet Count: 174 10*3/uL (ref 150–400)
RBC: 3.36 MIL/uL — ABNORMAL LOW (ref 4.22–5.81)
RDW: 14.4 % (ref 11.5–15.5)
WBC Count: 3.8 10*3/uL — ABNORMAL LOW (ref 4.0–10.5)
nRBC: 0 % (ref 0.0–0.2)

## 2022-08-25 LAB — LACTATE DEHYDROGENASE: LDH: 231 U/L — ABNORMAL HIGH (ref 98–192)

## 2022-08-25 MED ORDER — SODIUM CHLORIDE 0.9 % IV SOLN: Freq: Once | INTRAVENOUS | Status: AC

## 2022-08-25 MED ORDER — BORTEZOMIB CHEMO SQ INJECTION 3.5 MG (2.5MG/ML)
1.0000 mg/m2 | Freq: Once | INTRAMUSCULAR | Status: AC
  Administered 2022-08-25: 2.25 mg via SUBCUTANEOUS
  Filled 2022-08-25: qty 0.9

## 2022-08-25 MED ORDER — SODIUM CHLORIDE 0.9 % IV SOLN
200.0000 mg/m2 | Freq: Once | INTRAVENOUS | Status: AC
  Administered 2022-08-25: 480 mg via INTRAVENOUS
  Filled 2022-08-25: qty 24

## 2022-08-25 MED ORDER — PALONOSETRON HCL INJECTION 0.25 MG/5ML
0.2500 mg | Freq: Once | INTRAVENOUS | Status: AC
  Administered 2022-08-25: 0.25 mg via INTRAVENOUS
  Filled 2022-08-25: qty 5

## 2022-08-25 NOTE — Progress Notes (Signed)
Per Dr. [name] ok to treat today with creatinine of 1.87.

## 2022-08-25 NOTE — Patient Instructions (Signed)
[facility]  Discharge Instructions: Thank you for choosing [facility] to provide your oncology and hematology care.   If you have a lab appointment with the [facility], please go directly to the [facility] and check in at the registration area.   Wear comfortable clothing and clothing appropriate for easy access to any Portacath or PICC line.   We strive to give you quality time with your provider. You may need to reschedule your appointment if you arrive late (15 or more minutes).  Arriving late affects you and other patients whose appointments are after yours.  Also, if you miss three or more appointments without notifying the office, you may be dismissed from the clinic at the provider's discretion.      For prescription refill requests, have your pharmacy contact our office and allow 72 hours for refills to be completed.    Today you received the following chemotherapy and/or immunotherapy agents: Velcade and Cytoxan      To help prevent nausea and vomiting after your treatment, we encourage you to take your nausea medication as directed.  BELOW ARE SYMPTOMS THAT SHOULD BE REPORTED IMMEDIATELY: *FEVER GREATER THAN 100.4 F (38 C) OR HIGHER *CHILLS OR SWEATING *NAUSEA AND VOMITING THAT IS NOT CONTROLLED WITH YOUR NAUSEA MEDICATION *UNUSUAL SHORTNESS OF BREATH *UNUSUAL BRUISING OR BLEEDING *URINARY PROBLEMS (pain or burning when urinating, or frequent urination) *BOWEL PROBLEMS (unusual diarrhea, constipation, pain near the anus) TENDERNESS IN MOUTH AND THROAT WITH OR WITHOUT PRESENCE OF ULCERS (sore throat, sores in mouth, or a toothache) UNUSUAL RASH, SWELLING OR PAIN  UNUSUAL VAGINAL DISCHARGE OR ITCHING   Items with * indicate a potential emergency and should be followed up as soon as possible or go to the Emergency Department if any problems should occur.  Please show the CHEMOTHERAPY ALERT CARD or IMMUNOTHERAPY ALERT CARD at  check-in to the Emergency Department and triage nurse.  Should you have questions after your visit or need to cancel or reschedule your appointment, please contact [facility]  Dept: [phone]  and follow the prompts.  Office hours are 8:00 a.m. to 4:30 p.m. Monday - Friday. Please note that voicemails left after 4:00 p.m. may not be returned until the following business day.  We are closed weekends and major holidays. You have access to a nurse at all times for urgent questions. Please call the main number to the clinic Dept: [phone] and follow the prompts.   For any non-urgent questions, you may also contact your provider using MyChart. We now offer e-Visits for anyone 4 and older to request care online for non-urgent symptoms. For details visit [email or web address].   Also download the MyChart app! Go to the app store, search "MyChart", open the app, select [facility], and log in with your MyChart username and password.  Masks are optional in the cancer centers. If you would like for your care team to wear a mask while they are taking care of you, please let them know. You may have one support person who is at least 77 years old accompany you for your appointments.

## 2022-08-26 ENCOUNTER — Encounter: Payer: Self-pay | Admitting: Hematology and Oncology

## 2022-08-26 NOTE — Progress Notes (Signed)
DISCONTINUE ON PATHWAY REGIMEN - Multiple Myeloma and Other Plasma Cell Dyscrasias     A cycle is every 28 days:     Dexamethasone      Bortezomib      Cyclophosphamide   **Always confirm dose/schedule in your pharmacy ordering system**  REASON: Other Reason PRIOR TREATMENT: [id number]: CyBord (Cyclophosphamide 300 mg/m2 IV D1, 8, 15, 22 + Bortezomib 1.5 mg/m2 SUBQ D1, 8, 15, 22 + Dexamethasone 40 mg PO D1, 8, 15, 22) q28 Days x 8 Cycles TREATMENT RESPONSE: Complete Response (CR)  START OFF PATHWAY REGIMEN - Multiple Myeloma and Other Plasma Cell Dyscrasias   [id number]:Bortezomib 1.3 mg/m2 IV D1 q14 Days (Maintenance):   A cycle is every 14 days:     Bortezomib   **Always confirm dose/schedule in your pharmacy ordering system**  Patient Characteristics: Multiple Myeloma, Newly Diagnosed, Transplant Ineligible or Refused, Standard Risk Disease Classification: Multiple Myeloma R-ISS Staging: II Therapeutic Status: Newly Diagnosed Is Patient Eligible for Transplant<= Transplant Ineligible or Refused Risk Status: Standard Risk Intent of Therapy: Curative Intent, Discussed with Patient

## 2022-08-27 ENCOUNTER — Telehealth: Payer: Self-pay | Admitting: Hematology and Oncology

## 2022-08-27 ENCOUNTER — Other Ambulatory Visit: Payer: Self-pay

## 2022-08-27 NOTE — Telephone Encounter (Signed)
Called to confirm that patient does still have to take medication on treatment days. Spoke with patient's spouse. Patient will be notified.

## 2022-09-01 ENCOUNTER — Other Ambulatory Visit: Payer: Self-pay

## 2022-09-02 ENCOUNTER — Other Ambulatory Visit: Payer: No Typology Code available for payment source

## 2022-09-02 ENCOUNTER — Ambulatory Visit: Payer: No Typology Code available for payment source

## 2022-09-09 ENCOUNTER — Ambulatory Visit: Payer: No Typology Code available for payment source

## 2022-09-09 ENCOUNTER — Inpatient Hospital Stay (HOSPITAL_BASED_OUTPATIENT_CLINIC_OR_DEPARTMENT_OTHER): Payer: No Typology Code available for payment source | Admitting: Hematology and Oncology

## 2022-09-09 ENCOUNTER — Inpatient Hospital Stay: Payer: No Typology Code available for payment source | Attending: Hematology and Oncology

## 2022-09-09 ENCOUNTER — Inpatient Hospital Stay: Payer: No Typology Code available for payment source

## 2022-09-09 VITALS — BP 156/79 | HR 64 | Temp 98.3°F | Resp 14 | Wt 221.1 lb

## 2022-09-09 DIAGNOSIS — R600 Localized edema: Secondary | ICD-10-CM | POA: Diagnosis not present

## 2022-09-09 DIAGNOSIS — Z87891 Personal history of nicotine dependence: Secondary | ICD-10-CM | POA: Insufficient documentation

## 2022-09-09 DIAGNOSIS — Z5112 Encounter for antineoplastic immunotherapy: Secondary | ICD-10-CM | POA: Insufficient documentation

## 2022-09-09 DIAGNOSIS — Z8719 Personal history of other diseases of the digestive system: Secondary | ICD-10-CM | POA: Diagnosis not present

## 2022-09-09 DIAGNOSIS — N184 Chronic kidney disease, stage 4 (severe): Secondary | ICD-10-CM | POA: Diagnosis not present

## 2022-09-09 DIAGNOSIS — D649 Anemia, unspecified: Secondary | ICD-10-CM | POA: Insufficient documentation

## 2022-09-09 DIAGNOSIS — D631 Anemia in chronic kidney disease: Secondary | ICD-10-CM

## 2022-09-09 DIAGNOSIS — Z5111 Encounter for antineoplastic chemotherapy: Secondary | ICD-10-CM | POA: Diagnosis present

## 2022-09-09 DIAGNOSIS — Z881 Allergy status to other antibiotic agents status: Secondary | ICD-10-CM | POA: Diagnosis not present

## 2022-09-09 DIAGNOSIS — I480 Paroxysmal atrial fibrillation: Secondary | ICD-10-CM | POA: Insufficient documentation

## 2022-09-09 DIAGNOSIS — M79606 Pain in leg, unspecified: Secondary | ICD-10-CM | POA: Insufficient documentation

## 2022-09-09 DIAGNOSIS — G629 Polyneuropathy, unspecified: Secondary | ICD-10-CM | POA: Insufficient documentation

## 2022-09-09 DIAGNOSIS — Z809 Family history of malignant neoplasm, unspecified: Secondary | ICD-10-CM | POA: Diagnosis not present

## 2022-09-09 DIAGNOSIS — Z79899 Other long term (current) drug therapy: Secondary | ICD-10-CM | POA: Insufficient documentation

## 2022-09-09 DIAGNOSIS — M545 Low back pain, unspecified: Secondary | ICD-10-CM | POA: Diagnosis not present

## 2022-09-09 DIAGNOSIS — C9000 Multiple myeloma not having achieved remission: Secondary | ICD-10-CM | POA: Diagnosis not present

## 2022-09-09 DIAGNOSIS — I341 Nonrheumatic mitral (valve) prolapse: Secondary | ICD-10-CM | POA: Insufficient documentation

## 2022-09-09 DIAGNOSIS — Z7901 Long term (current) use of anticoagulants: Secondary | ICD-10-CM | POA: Diagnosis not present

## 2022-09-09 LAB — CBC WITH DIFFERENTIAL (CANCER CENTER ONLY)
Abs Immature Granulocytes: 0 10*3/uL (ref 0.00–0.07)
Basophils Absolute: 0.1 10*3/uL (ref 0.0–0.1)
Basophils Relative: 1 %
Eosinophils Absolute: 0.3 10*3/uL (ref 0.0–0.5)
Eosinophils Relative: 7 %
HCT: 35.4 % — ABNORMAL LOW (ref 39.0–52.0)
Hemoglobin: 11.7 g/dL — ABNORMAL LOW (ref 13.0–17.0)
Immature Granulocytes: 0 %
Lymphocytes Relative: 15 %
Lymphs Abs: 0.8 10*3/uL (ref 0.7–4.0)
MCH: 34.8 pg — ABNORMAL HIGH (ref 26.0–34.0)
MCHC: 33.1 g/dL (ref 30.0–36.0)
MCV: 105.4 fL — ABNORMAL HIGH (ref 80.0–100.0)
Monocytes Absolute: 0.7 10*3/uL (ref 0.1–1.0)
Monocytes Relative: 14 %
Neutro Abs: 3.3 10*3/uL (ref 1.7–7.7)
Neutrophils Relative %: 63 %
Platelet Count: 229 10*3/uL (ref 150–400)
RBC: 3.36 MIL/uL — ABNORMAL LOW (ref 4.22–5.81)
RDW: 14.1 % (ref 11.5–15.5)
WBC Count: 5.2 10*3/uL (ref 4.0–10.5)
nRBC: 0 % (ref 0.0–0.2)

## 2022-09-09 LAB — CMP (CANCER CENTER ONLY)
ALT: 17 U/L (ref 0–44)
AST: 23 U/L (ref 15–41)
Albumin: 4.1 g/dL (ref 3.5–5.0)
Alkaline Phosphatase: 66 U/L (ref 38–126)
Anion gap: 7 (ref 5–15)
BUN: 15 mg/dL (ref 8–23)
CO2: 26 mmol/L (ref 22–32)
Calcium: 9.2 mg/dL (ref 8.9–10.3)
Chloride: 107 mmol/L (ref 98–111)
Creatinine: 1.79 mg/dL — ABNORMAL HIGH (ref 0.61–1.24)
GFR, Estimated: 39 mL/min — ABNORMAL LOW (ref >60)
Glucose, Bld: 98 mg/dL (ref 70–99)
Potassium: 4.2 mmol/L (ref 3.5–5.1)
Sodium: 140 mmol/L (ref 135–145)
Total Bilirubin: 0.5 mg/dL (ref 0.3–1.2)
Total Protein: 6.8 g/dL (ref 6.5–8.1)

## 2022-09-09 LAB — LACTATE DEHYDROGENASE: LDH: 257 U/L — ABNORMAL HIGH (ref 98–192)

## 2022-09-09 MED ORDER — BORTEZOMIB CHEMO SQ INJECTION 3.5 MG (2.5MG/ML)
1.3000 mg/m2 | Freq: Once | INTRAMUSCULAR | Status: AC
  Administered 2022-09-09: 3 mg via SUBCUTANEOUS
  Filled 2022-09-09: qty 1.2

## 2022-09-09 MED ORDER — PROCHLORPERAZINE MALEATE 10 MG PO TABS
10.0000 mg | ORAL_TABLET | Freq: Once | ORAL | Status: AC
  Administered 2022-09-09: 10 mg via ORAL
  Filled 2022-09-09: qty 1

## 2022-09-09 NOTE — Progress Notes (Signed)
Confirmed Velcade dose basis w/ Dr. [name] (as ordered: 1.3 mg/m2).  Kennith Center, Pharm.D., CPP 09/09/2022'@10'$ :16 AM

## 2022-09-09 NOTE — Progress Notes (Signed)
[place] [phone]   [phone]  PROGRESS NOTE  Patient Care Team: [name], MD as PCP - General (Internal Medicine)  Hematological/Oncological History # IgA Lambda Multiple Myeloma 05/03/2015: last visit with Darren Hinton at the [facility]. Was followed for IgA lambda MGUS. 12/11/2021: labs show M protein 3.4, Kappa 19.2, Lambda 1576.4, ratio 0.01. Cr 3.47, Hgb 8.6, WBC 5.7, MCV 99, Plt 221 12/23/2021: establish care with Darren Hinton  01/16/2022: Bone marrow biopsy performed, showed a 60% cellular bone marrow predominantly comprised of plasma cells making up 70 to 80%, lambda restricted.  Myeloma FISH panel showed no evidence of abnormalities. 02/07/2022: Cycle 1 Day 1 of CyBorD chemotherapy.  03/09/2022-03/17/2022: hospitalized for fever/pneumonia.  03/24/2022: Cycle 2 Day 1 of CyBorD chemotherapy.  04/22/2022: Cycle 3 Day 1 of CyBorD chemotherapy 05/19/2022-05/26/2022: Delayed start of Cycle 4 Day 1 of CyBorD chemotherapy due to neutropenia.  06/03/2022: Cycle 4 Day 1 of CyBorD chemotherapy. 25% dose reduction of cyclophosphamide due to cytopenias. 06/30/2022: Cycle 4 Day 22. Drop Velcade dose to 26m/m2 and Cyclophosphamide 200 mg/m2  07/07/2022: Cycle 5 Day 1 of CyBorD chemotherapy with dose reductions.  08/04/2022: Cycle 6 Day 1 of CyBorD chemotherapy with dose reductions.  09/09/2022: transition to maintenance dose Velcade q 2 weeks.   Interval History:  Darren Hinton 77y.o. male with medical history significant for IgA lambda multiple myeloma who presents for a follow up visit. The patient's last visit was on 08/26/2022. In the interim since the last visit he has completed CyBorD chemotherapy and has transitioned to maintenance Velcade.   On exam today Mr. WWhittinghillreports he is feeling well overall and his new years off to good start.  He had a good Christmas, birthday, and years.  He reports that he feels like the swelling in his feet is  improving as he is able to fit in his shoes more easily.  He also notes his neuropathy is under good control.  He is excited to be working with physical therapy and notes that they will be coming to his house and working with him there.  He notes he does still have a little bit of numbness periodically and some occasional lower extremity pain, but overall he feels well.  He tolerated his last round of chemotherapy with no nausea, vomiting, diarrhea, lightheadedness, dizziness, or shortness of breath..  He is willing and able to proceed with treatment today.  He is tolerating his Eliquis well with no bleeding, bruising, or dark stools. A full 10 point ROS was otherwise negative.   MEDICAL HISTORY:  Past Medical History:  Diagnosis Date   Allergy    Anxiety    Arthritis    Depression    Heart murmur    Hyperlipidemia    Hyperplastic colon polyp    Hyperproteinemia    Hypertension    Hypothyroidism    Mitral valve prolapse    Paroxysmal atrial fibrillation (HCC)    Thyroid disease     SURGICAL HISTORY: Past Surgical History:  Procedure Laterality Date   CARDIOVERSION N/A 03/14/2022   Procedure: CARDIOVERSION;  Surgeon: Darren Casino MD;  [place] [facility] [facility]  Service: Cardiovascular;  Laterality: N/A;   TEE WITHOUT CARDIOVERSION N/A 03/14/2022   Procedure: TRANSESOPHAGEAL ECHOCARDIOGRAM (TEE);  Surgeon: Darren Casino MD;  [facility]  Service: Cardiovascular;  Laterality: N/A;    SOCIAL HISTORY: Social History   Socioeconomic History   Marital status: Married    Spouse  name: Not on file   Number of children: Not on file   Years of education: Not on file   Highest education level: Not on file  Occupational History   Not on file  Tobacco Use   Smoking status: Former    Years: 20.00    Types: Cigarettes    Quit date: 10/01/1978    Years since quitting: 43.9   Smokeless tobacco: Never   Tobacco comments:    Former smoker 04/03/22  Substance and Sexual  Activity   Alcohol use: Yes    Alcohol/week: 2.0 standard drinks of alcohol    Types: 2 Glasses of wine per week    Comment: occasionally   Drug use: No   Sexual activity: Not on file  Other Topics Concern   Not on file  Social History Narrative   Not on file   Social Determinants of Health   Financial Resource Strain: Not on file  Food Insecurity: Not on file  Transportation Needs: Not on file  Physical Activity: Not on file  Stress: Not on file  Social Connections: Not on file  Intimate Partner Violence: Not on file    FAMILY HISTORY: Family History  Problem Relation Age of Onset   Cancer Father    Colon cancer Neg Hx    Esophageal cancer Neg Hx    Liver cancer Neg Hx    Pancreatic cancer Neg Hx    Rectal cancer Neg Hx    Stomach cancer Neg Hx     ALLERGIES:  is allergic to zithromax [azithromycin].  MEDICATIONS:  Current Outpatient Medications  Medication Sig Dispense Refill   acyclovir (ZOVIRAX) 400 MG tablet TAKE 1 TABLET BY MOUTH TWICE A DAY 180 tablet 1   allopurinol (ZYLOPRIM) 300 MG tablet TAKE 1 TABLET BY MOUTH DAILY 100 tablet 2   ALPRAZolam (XANAX) 0.25 MG tablet Take 0.25 mg by mouth 2 (two) times daily as needed for anxiety.     amiodarone (PACERONE) 200 MG tablet TAKE 1 TABLET BY MOUTH EVERY DAY 90 tablet 3   amLODipine (NORVASC) 5 MG tablet TAKE 1 TABLET (5 MG TOTAL) BY MOUTH DAILY. 90 tablet 3   apixaban (ELIQUIS) 5 MG TABS tablet Take 1 tablet (5 mg total) by mouth 2 (two) times daily. 180 tablet 1   atorvastatin (LIPITOR) 20 MG tablet Take 20 mg by mouth daily in the afternoon.     dexamethasone (DECADRON) 4 MG tablet TAKE 10 TABLETS (40 MG TOTAL) BY MOUTH ONCE A WEEK IN THE MORNING ON CHEMOTHERAPY DAYS 120 tablet 1   ergocalciferol (VITAMIN D2) 1.25 MG (50000 UT) capsule Take 50,000 Units by mouth every Wednesday.     ferrous gluconate (FERGON) 324 MG tablet Take 1 tablet (324 mg total) by mouth 3 (three) times daily with meals. 90 tablet 0    fluticasone (FLONASE) 50 MCG/ACT nasal spray Place 2 sprays into the nose daily as needed for allergies.     gabapentin (NEURONTIN) 300 MG capsule Take 1 capsule (300 mg total) by mouth 2 (two) times daily. 60 capsule 1   levothyroxine (SYNTHROID) 100 MCG tablet Take 100 mcg by mouth See admin instructions. Take 1 tablet (100 mcg) by mouth on Mondays, Tuesdays, Wednesdays, Thursdays, Fridays & Saturdays in the morning. Skip a dose on Sundays.     loratadine (CLARITIN) 10 MG tablet Take 10 mg by mouth daily as needed for allergies.     meclizine (ANTIVERT) 25 MG tablet Take 1 tablet (25 mg total) by mouth  3 (three) times daily as needed for dizziness. 30 tablet 0   metoprolol succinate (TOPROL-XL) 25 MG 24 hr tablet Take 1 tablet (25 mg total) by mouth daily. Take with or immediately following a meal. 30 tablet 3   prochlorperazine (COMPAZINE) 10 MG tablet Take 1 tablet (10 mg total) by mouth every 6 (six) hours as needed for nausea or vomiting. 30 tablet 0   senna-docusate (SENOKOT-S) 8.6-50 MG tablet Take 1 tablet by mouth at bedtime as needed for mild constipation.     traMADol (ULTRAM) 50 MG tablet Take 1 tablet (50 mg total) by mouth every 6 (six) hours as needed. 30 tablet 0   vitamin B-12 (CYANOCOBALAMIN) 500 MCG tablet Take 500 mcg by mouth in the morning.     Current Facility-Administered Medications  Medication Dose Route Frequency Provider Last Rate Last Admin   0.9 %  sodium chloride infusion  500 mL Intravenous Once Armbruster, Carlota Raspberry, MD        REVIEW OF SYSTEMS:   Constitutional: ( - ) fevers, ( - )  chills , ( - ) night sweats Eyes: ( - ) blurriness of vision, ( - ) double vision, ( - ) watery eyes Ears, nose, mouth, throat, and face: ( - ) mucositis, ( - ) sore throat Respiratory: ( - ) cough, ( - ) dyspnea, ( - ) wheezes Cardiovascular: ( - ) palpitation, ( - ) chest discomfort, ( - ) lower extremity swelling Gastrointestinal:  ( - ) nausea, ( - ) heartburn, ( - ) change in  bowel habits Skin: ( - ) abnormal skin rashes Lymphatics: ( - ) new lymphadenopathy, ( - ) easy bruising Neurological: ( - ) numbness, ( - ) tingling, ( - ) new weaknesses Behavioral/Psych: ( - ) mood change, ( - ) new changes  All other systems were reviewed with the patient and are negative.  PHYSICAL EXAMINATION: ECOG PERFORMANCE STATUS: 1 - Symptomatic but completely ambulatory  Vitals:   09/09/22 0858  BP: (!) 156/79  Pulse: 64  Resp: 14  Temp: 98.3 F (36.8 C)  SpO2: 100%       Filed Weights   09/09/22 0858  Weight: 221 lb 1.6 oz (100.3 kg)     GENERAL: Chronically ill-appearing elderly African-American male, alert, no distress and comfortable SKIN: skin color, texture, turgor are normal, no rashes or significant lesions EYES: conjunctiva are pink and non-injected, sclera clear LUNGS: clear to auscultation and percussion with normal breathing effort HEART: regular rate & rhythm and no murmurs and +3 bilateral lower extremity edema Musculoskeletal: no cyanosis of digits and no clubbing  PSYCH: alert & oriented x 3, fluent speech NEURO: no focal motor/sensory deficits  LABORATORY DATA:  I have reviewed the data as listed    Latest Ref Rng & Units 09/09/2022    8:29 AM 08/25/2022    8:55 AM 08/18/2022    8:09 AM  CBC  WBC 4.0 - 10.5 K/uL 5.2  3.8  3.8   Hemoglobin 13.0 - 17.0 g/dL 11.7  11.7  12.2   Hematocrit 39.0 - 52.0 % 35.4  34.9  36.7   Platelets 150 - 400 K/uL 229  174  164        Latest Ref Rng & Units 09/09/2022    8:29 AM 08/25/2022    8:55 AM 08/18/2022    8:09 AM  CMP  Glucose 70 - 99 mg/dL 98  112  106   BUN 8 - 23 mg/dL 15  16  12   Creatinine 0.61 - 1.24 mg/dL 1.79  1.87  1.66   Sodium 135 - 145 mmol/L 140  140  139   Potassium 3.5 - 5.1 mmol/L 4.2  4.1  4.1   Chloride 98 - 111 mmol/L 107  106  107   CO2 22 - 32 mmol/L _0 Calcium 8.9 - 10.3 mg/dL 9.2  9.5  9.0   Total Protein 6.5 - 8.1 g/dL 6.8  5.8  6.1   Total Bilirubin 0.3  - 1.2 mg/dL 0.5  0.6  0.6   Alkaline Phos 38 - 126 U/L 66  57  54   AST 15 - 41 U/L _1 ALT 0 - 44 U/L _2 Lab Results  Component Value Date   MPROTEIN Not Observed 08/04/2022   MPROTEIN Not Observed 07/07/2022   MPROTEIN Not Observed 06/30/2022   Lab Results  Component Value Date   KPAFRELGTCHN 12.1 08/04/2022   KPAFRELGTCHN 13.8 07/07/2022   KPAFRELGTCHN 15.0 06/30/2022   LAMBDASER 5.6 (L) 08/04/2022   LAMBDASER 7.7 07/07/2022   LAMBDASER 9.2 06/30/2022   KAPLAMBRATIO 2.16 (H) 08/04/2022   KAPLAMBRATIO 1.79 (H) 07/07/2022   KAPLAMBRATIO 1.63 06/30/2022    RADIOGRAPHIC STUDIES: No results found.  ASSESSMENT & PLAN Darren Hinton. 77 y.o. male with medical history significant for IgA lambda multiple myeloma who presents for a follow up visit.   Previously we discussed the disease process of multiple myeloma.  We discussed the incurable nature of this disease and the nature of the treatment.  We discussed that this is a cancer of the bone marrow with plasma cells producing an abundance of M protein which in this patient's case causes his severe renal dysfunction.  Additionally we noted that myeloma can cause lytic lesions of the bone, anemia, and increased levels of calcium.  We discussed the treatment moving forward including doublet and triplet therapy.  Given the patient's good health overall, but poor kidney function I would recommend proceeding with CyBorD chemotherapy.  Darren Hinton voices understanding of the treatment regimen and the plan moving forward.  At this time we will plan to proceed with Velcade 1.5 mg per metered squared subq weekly, cyclophosphamide 300 mg per metered squared IV weekly, and dexamethasone 20 mg p.o. weekly.  Cycles will consist of 4 weekly treatments and will continue for a minimum of 6 cycles before consideration of maintenance therapy.  Additionally if kidney function improves can consider transitioning regimen to VRD.    #  IgA lambda Multiple Myeloma -- Bone marrow biopsy performed on 01/16/2022 showed increased plasma cells consistent with a plasma cell neoplasm.  Normal FISH panel. --Patient meets diagnostic criteria based on kidney dysfunction and anemia. --Cycle 1 Day 1 of CyBorD chemotherapy started on 02/07/2022 --Velcade maintenance therapy started on 09/09/2022.  Plan: --Most recent SPEP from 08/04/2022 showed M protein 0.0 g/dL, kappa 12.1, lambda 5.6, ratio 2.16. This is a robust response to therapy. Labs repeated today.  --today is the start of Velcade maintenance therapy.  --Labs today show white blood cell 5.2, hemoglobin 11.7, MCV 105.4, and platelets of 229 --moving forward will plan for Velcade every 2 weeks with monthly clinic visits. --RTC in 4 weeks with interval q 2 week monotherapy maintenance Velcade treatments   # Leg Pain -- Likely a component of worsening neuropathy from his chemotherapy with pre-existing pain. -- continue gabapentin  to 300 mg twice daily -- continue tramadol 50 mg every 6 hours as needed  #Supportive Care -- chemotherapy education complete -- port placement not required.  -- zofran 92m q8H PRN and compazine 115mPO q6H for nausea -- acyclovir 40045mO BID for VCZ prophylaxis -- allopurinol 300m9m daily for TLS prophylaxis. This can be d/c at next visit.  --zometa to start after dental clearance.   No orders of the defined types were placed in this encounter.  All questions were answered. The patient knows to call the clinic with any problems, questions or concerns.  I have spent a total of 30 minutes minutes of face-to-face and non-face-to-face time, preparing to see the patient, performing a medically appropriate examination, counseling and educating the patient, documenting clinical information in the electronic health record,  and care coordination.   Darren Hinton Department of Hematology/Oncology [facility]:  [phone]: [phone]: [email or web address]  09/09/2022 9:24 AM

## 2022-09-09 NOTE — Patient Instructions (Signed)
[place] [facility]  Discharge Instructions: Thank you for choosing [facility] to provide your oncology and hematology care.   If you have a lab appointment with the [facility], please go directly to the [facility] and check in at the registration area.   Wear comfortable clothing and clothing appropriate for easy access to any Portacath or PICC line.   We strive to give you quality time with your provider. You may need to reschedule your appointment if you arrive late (15 or more minutes).  Arriving late affects you and other patients whose appointments are after yours.  Also, if you miss three or more appointments without notifying the office, you may be dismissed from the clinic at the provider's discretion.      For prescription refill requests, have your pharmacy contact our office and allow 72 hours for refills to be completed.    Today you received the following chemotherapy and/or immunotherapy agents: Velcade     To help prevent nausea and vomiting after your treatment, we encourage you to take your nausea medication as directed.  BELOW ARE SYMPTOMS THAT SHOULD BE REPORTED IMMEDIATELY: *FEVER GREATER THAN 100.4 F (38 C) OR HIGHER *CHILLS OR SWEATING *NAUSEA AND VOMITING THAT IS NOT CONTROLLED WITH YOUR NAUSEA MEDICATION *UNUSUAL SHORTNESS OF BREATH *UNUSUAL BRUISING OR BLEEDING *URINARY PROBLEMS (pain or burning when urinating, or frequent urination) *BOWEL PROBLEMS (unusual diarrhea, constipation, pain near the anus) TENDERNESS IN MOUTH AND THROAT WITH OR WITHOUT PRESENCE OF ULCERS (sore throat, sores in mouth, or a toothache) UNUSUAL RASH, SWELLING OR PAIN  UNUSUAL VAGINAL DISCHARGE OR ITCHING   Items with * indicate a potential emergency and should be followed up as soon as possible or go to the Emergency Department if any problems should occur.  Please show the CHEMOTHERAPY ALERT CARD or IMMUNOTHERAPY ALERT CARD at check-in to the  Emergency Department and triage nurse.  Should you have questions after your visit or need to cancel or reschedule your appointment, please contact [place] [facility]  Dept: [phone]  and follow the prompts.  Office hours are 8:00 a.m. to 4:30 p.m. Monday - Friday. Please note that voicemails left after 4:00 p.m. may not be returned until the following business day.  We are closed weekends and major holidays. You have access to a nurse at all times for urgent questions. Please call the main number to the clinic Dept: [phone] and follow the prompts.   For any non-urgent questions, you may also contact your provider using MyChart. We now offer e-Visits for anyone 46 and older to request care online for non-urgent symptoms. For details visit [email or web address].   Also download the MyChart app! Go to the app store, search "MyChart", open the app, select [facility], and log in with your MyChart username and password.  Masks are optional in the cancer centers. If you would like for your care team to wear a mask while they are taking care of you, please let them know. You may have one support Arianne Klinge who is at least 78 years old accompany you for your appointments.

## 2022-09-09 NOTE — Progress Notes (Signed)
Per Dr. [name], okay to treat with Creatinine 1.79

## 2022-09-10 ENCOUNTER — Ambulatory Visit
Payer: No Typology Code available for payment source | Attending: Cardiovascular Disease | Admitting: Cardiovascular Disease

## 2022-09-10 ENCOUNTER — Encounter: Payer: Self-pay | Admitting: Cardiovascular Disease

## 2022-09-10 ENCOUNTER — Telehealth: Payer: Self-pay

## 2022-09-10 ENCOUNTER — Telehealth: Payer: Self-pay | Admitting: Hematology and Oncology

## 2022-09-10 VITALS — BP 130/68 | HR 68 | Ht 77.0 in | Wt 226.6 lb

## 2022-09-10 DIAGNOSIS — D6869 Other thrombophilia: Secondary | ICD-10-CM

## 2022-09-10 DIAGNOSIS — I4819 Other persistent atrial fibrillation: Secondary | ICD-10-CM | POA: Diagnosis not present

## 2022-09-10 DIAGNOSIS — Z5181 Encounter for therapeutic drug level monitoring: Secondary | ICD-10-CM

## 2022-09-10 DIAGNOSIS — E039 Hypothyroidism, unspecified: Secondary | ICD-10-CM

## 2022-09-10 DIAGNOSIS — C9000 Multiple myeloma not having achieved remission: Secondary | ICD-10-CM

## 2022-09-10 DIAGNOSIS — Z79899 Other long term (current) drug therapy: Secondary | ICD-10-CM

## 2022-09-10 DIAGNOSIS — N1832 Chronic kidney disease, stage 3b: Secondary | ICD-10-CM

## 2022-09-10 DIAGNOSIS — I10 Essential (primary) hypertension: Secondary | ICD-10-CM

## 2022-09-10 DIAGNOSIS — I340 Nonrheumatic mitral (valve) insufficiency: Secondary | ICD-10-CM

## 2022-09-10 LAB — KAPPA/LAMBDA LIGHT CHAINS
Kappa free light chain: 15.1 mg/L (ref 3.3–19.4)
Kappa, lambda light chain ratio: 1.7 — ABNORMAL HIGH (ref 0.26–1.65)
Lambda free light chains: 8.9 mg/L (ref 5.7–26.3)

## 2022-09-10 NOTE — Patient Instructions (Signed)
Medication Instructions:  Your physician recommends that you continue on your current medications as directed. Please refer to the Current Medication list given to you today.  *If you need a refill on your cardiac medications before your next appointment, please call your pharmacy*   Follow-Up: At [facility], you and your health needs are our priority.  As part of our continuing mission to provide you with exceptional heart care, we have created designated Provider Care Teams.  These Care Teams include your primary Cardiologist (physician) and Advanced Practice Providers (APPs -  Physician Assistants and Nurse Practitioners) who all work together to provide you with the care you need, when you need it.  We recommend signing up for the patient portal called "MyChart".  Sign up information is provided on this After Visit Summary.  MyChart is used to connect with patients for Virtual Visits (Telemedicine).  Patients are able to view lab/test results, encounter notes, upcoming appointments, etc.  Non-urgent messages can be sent to your provider as well.   To learn more about what you can do with MyChart, go to [email or web address].    Your next appointment:   6 month(s)  The format for your next appointment:   In Person  Provider:   Mihai Croitoru, MD 

## 2022-09-10 NOTE — Telephone Encounter (Signed)
Called patient per 1/3 in basket high priority message. Spoke with patient's spouse. Changed future appointments and patient will be notified.

## 2022-09-10 NOTE — Progress Notes (Signed)
Cardiology Office Note:    Date:  09/10/2022   ID:  Darren Hinton., DOB 02/19/1946, MRN [id number]  PCP:  [name], MD   [facility] Providers Cardiologist:  None     Referring MD: [name], MD   Chief Complaint  Patient presents with   Atrial Fibrillation    History of Present Illness:    Darren Hinton. is a 77 y.o. male with a hx of recently diagnosed persistent atrial fibrillation, with early recurrence following cardioversion in July 2023, multiple myeloma (IgA lambda, Dr. [name], currently onCyBorD chemotherapy), hypertension returning in follow-up.  Amiodarone was started with plan for repeat cardioversion in November.  When he presented for the cardioversion and he was back in normal sinus rhythm.  He is still in sinus rhythm today and feels substantially better than when he was in atrial fibrillation.  He has more energy, less breathlessness.  He continues to have some lower extremity edema, consistently worse on the right ankle than the left.  He does not have any weeping skin lesions, blisters or ulcerations.  Compliant with Eliquis and has not had any bleeding problems.  He has always been oblivious to palpitations when in atrial fibrillation.  Although he remains rather unsteady on his feet he has not had any new falls.  He denies any dizziness or syncope.  Biggest complaint is bilateral leg pain attributed to lumbar radiculopathy.  He had a follow-up visit with Dr. [name] for his IgA lambda multiple myeloma CyBorD treatment yesterday and received a good report.  He is an [employer].  He has been able to get his Eliquis from the [place] hospital.  Past Medical History:  Diagnosis Date   Allergy    Anxiety    Arthritis    Depression    Heart murmur    Hyperlipidemia    Hyperplastic colon polyp    Hyperproteinemia    Hypertension    Hypothyroidism    Mitral valve prolapse    Paroxysmal atrial fibrillation (HCC)     Thyroid disease     Past Surgical History:  Procedure Laterality Date   CARDIOVERSION N/A 03/14/2022   Procedure: CARDIOVERSION;  Surgeon: Pixie Casino, MD;  Location: [facility];  Service: Cardiovascular;  Laterality: N/A;   TEE WITHOUT CARDIOVERSION N/A 03/14/2022   Procedure: TRANSESOPHAGEAL ECHOCARDIOGRAM (TEE);  Surgeon: Pixie Casino, MD;  Location: [facility] ENDOSCOPY;  Service: Cardiovascular;  Laterality: N/A;    Current Medications: Current Meds  Medication Sig   acyclovir (ZOVIRAX) 400 MG tablet TAKE 1 TABLET BY MOUTH TWICE A DAY   allopurinol (ZYLOPRIM) 300 MG tablet TAKE 1 TABLET BY MOUTH DAILY   amiodarone (PACERONE) 200 MG tablet TAKE 1 TABLET BY MOUTH EVERY DAY   amLODipine (NORVASC) 5 MG tablet TAKE 1 TABLET (5 MG TOTAL) BY MOUTH DAILY.   apixaban (ELIQUIS) 5 MG TABS tablet Take 1 tablet (5 mg total) by mouth 2 (two) times daily.   atorvastatin (LIPITOR) 20 MG tablet Take 20 mg by mouth daily in the afternoon.   dexamethasone (DECADRON) 4 MG tablet TAKE 10 TABLETS (40 MG TOTAL) BY MOUTH ONCE A WEEK IN THE MORNING ON CHEMOTHERAPY DAYS   ergocalciferol (VITAMIN D2) 1.25 MG (50000 UT) capsule Take 50,000 Units by mouth every Wednesday.   ferrous gluconate (FERGON) 324 MG tablet Take 1 tablet (324 mg total) by mouth 3 (three) times daily with meals.   gabapentin (NEURONTIN) 300 MG capsule Take 1 capsule (  300 mg total) by mouth 2 (two) times daily.   levothyroxine (SYNTHROID) 100 MCG tablet Take 100 mcg by mouth See admin instructions. Take 1 tablet (100 mcg) by mouth on Mondays, Tuesdays, Wednesdays, Thursdays, Fridays & Saturdays in the morning. Skip a dose on Sundays.   loratadine (CLARITIN) 10 MG tablet Take 10 mg by mouth daily as needed for allergies.   metoprolol succinate (TOPROL-XL) 25 MG 24 hr tablet Take 1 tablet (25 mg total) by mouth daily. Take with or immediately following a meal.   senna-docusate (SENOKOT-S) 8.6-50 MG tablet Take 1 tablet by mouth at bedtime as  needed for mild constipation.   vitamin B-12 (CYANOCOBALAMIN) 500 MCG tablet Take 500 mcg by mouth in the morning.   Current Facility-Administered Medications for the 09/10/22 encounter (Office Visit) with Shanequa Whitenight, MD  Medication   0.9 %  sodium chloride infusion     Allergies:   Zithromax [azithromycin]   Social History   Socioeconomic History   Marital status: Married    Spouse name: Not on file   Number of children: Not on file   Years of education: Not on file   Highest education level: Not on file  Occupational History   Not on file  Tobacco Use   Smoking status: Former    Years: 20.00    Types: Cigarettes    Quit date: 10/01/1978    Years since quitting: 43.9   Smokeless tobacco: Never   Tobacco comments:    Former smoker 04/03/22  Substance and Sexual Activity   Alcohol use: Yes    Alcohol/week: 2.0 standard drinks of alcohol    Types: 2 Glasses of wine per week    Comment: occasionally   Drug use: No   Sexual activity: Not on file  Other Topics Concern   Not on file  Social History Narrative   Not on file   Social Determinants of Health   Financial Resource Strain: Not on file  Food Insecurity: Not on file  Transportation Needs: Not on file  Physical Activity: Not on file  Stress: Not on file  Social Connections: Not on file     Family History: The patient's family history includes Cancer in his father. There is no history of Colon cancer, Esophageal cancer, Liver cancer, Pancreatic cancer, Rectal cancer, or Stomach cancer.  ROS:   Please see the history of present illness.     All other systems reviewed and are negative.  EKGs/Labs/Other Studies Reviewed:    The following studies were reviewed today: Notes from Dr. [name] from yesterday  TEE-cardioversion 03/14/2022   1. Left ventricular ejection fraction, by estimation, is 50 to 55%. The left ventricle has low normal function. There is mild left ventricular hypertrophy.   2. Right  ventricular systolic function is normal. The right ventricular  size is normal.   3. No left atrial/left atrial appendage thrombus was detected.   4. The mitral valve is grossly normal. Mild mitral valve regurgitation.   5. The aortic valve is tricuspid. Aortic valve regurgitation is not  visualized.   6. 1. Cardioverted 4 time(s).      2. Cardioverted at 120J, 150J and 200J x 2 biphasic. Converted to sinus after the initial shock, then back in afib- converted to sinus again for a few seconds with 200J, then back in afib.   Conclusion(s)/Recommendation(s): No evidence of vegetation/infective endocarditis on this transesophageael echocardiogram. No LA/LAA thrombus identified. Unsuccessful cardioversion performed without restoration of normal sinus rhythm.   EKG:  EKG is not ordered today.  Similar to previous tracing shows normal sinus rhythm, borderline first-degree AV block (206 ms), incomplete right bundle branch block, voltage criteria only for LVH.  Normal QTc  367 ms  Recent Labs: 03/12/2022: TSH 3.629 03/14/2022: Magnesium 2.3 09/09/2022: ALT 17; BUN 15; Creatinine 1.79; Hemoglobin 11.7; Platelet Count 229; Potassium 4.2; Sodium 140  Recent Lipid Panel    Component Value Date/Time   CHOL  08/31/2008 0532    160        ATP III CLASSIFICATION:  <200     mg/dL   Desirable  200-239  mg/dL   Borderline High  >=240    mg/dL   High   TRIG 49 08/31/2008 0532   HDL 48 08/31/2008 0532   CHOLHDL 3.3 08/31/2008 0532   VLDL 10 08/31/2008 0532   LDLCALC (H) 08/31/2008 0532    102        Total Cholesterol/HDL:CHD Risk Coronary Heart Disease Risk Table                     Men   Women  1/2 Average Risk   3.4   3.3     Risk Assessment/Calculations:    CHA2DS2-VASc Score = 3   This indicates a 3.2% annual risk of stroke. The patient's score is based upon: CHF History: 0 HTN History: 1 Diabetes History: 0 Stroke History: 0 Vascular Disease History: 0 Age Score: 2 Gender Score: 0                Physical Exam:    VS:  BP 130/68 (BP Location: Left Arm, Patient Position: Sitting, Cuff Size: Large)   Pulse 68   Ht _0  (1.956 m)   Wt 226 lb 9.6 oz (102.8 kg)   SpO2 97%   BMI 26.87 kg/m     Wt Readings from Last 3 Encounters:  09/10/22 226 lb 9.6 oz (102.8 kg)  09/09/22 221 lb 1.6 oz (100.3 kg)  08/25/22 228 lb 4.8 oz (103.6 kg)     General: Alert, oriented x3, no distress,  Head: no evidence of trauma, PERRL, EOMI, no exophtalmos or lid lag, no myxedema, no xanthelasma; normal ears, nose and oropharynx Neck: normal jugular venous pulsations and no hepatojugular reflux; brisk carotid pulses without delay and no carotid bruits Chest: clear to auscultation, no signs of consolidation by percussion or palpation, normal fremitus, symmetrical and full respiratory excursions Cardiovascular: normal position and quality of the apical impulse, regular rhythm, normal first and second heart sounds, no murmurs, rubs or gallops Abdomen: no tenderness or distention, no masses by palpation, no abnormal pulsatility or arterial bruits, normal bowel sounds, no hepatosplenomegaly Extremities: no clubbing, cyanosis; 2+ ankle edema, R>L Neurological: grossly nonfocal Psych: Normal mood and affect   ASSESSMENT:    1. Persistent atrial fibrillation ([place])   2. Acquired thrombophilia ([place])   3. Encounter for monitoring amiodarone therapy   4. Multiple myeloma not having achieved remission ([place])   5. Essential hypertension   6. Stage 3b chronic kidney disease ([place])   7. Acquired hypothyroidism     PLAN:    In order of problems listed above:  AFib: Maintaining normal rhythm on amiodarone.  Compliant with Eliquis without bleeding.  We discussed the fact that the arrhythmia is likely to recur, hopefully not as prevalent or persistent. Anticoagulation: No bleeding complications Amiodarone: If he remains on this medication, need to monitor TSH and LFTs at least every 6 months.   He  will be having labs frequently in the cancer center.  Normal liver function tests yesterday.  Needs liver function test and thyroid function test every 6 months while on amiodarone. Multiple myeloma on CyBorD chemotherapy: Good report at oncology clinic appointment yesterday.  He had a history of atrial fibrillation 3 years ago making it less likely that the chemo is the cause for his arrhythmia and borderline reduction in LV systolic function.  Should recheck his echocardiogram at his next appointment. HTN: Well-controlled on current medications. CKD 3B: Creatinine remains at baseline (1.7-2.0; GFR 35-40). Acquired hypothyroidism: TSH normal in July.  Need to recheck TSH with next labs, which are checked once or twice a month every month in the cancer center.      Shared Decision Making/Informed Consent The risks (stroke, cardiac arrhythmias rarely resulting in the need for a temporary or permanent pacemaker, skin irritation or burns and complications associated with conscious sedation including aspiration, arrhythmia, respiratory failure and death), benefits (restoration of normal sinus rhythm) and alternatives of a direct current cardioversion were explained in detail to Mr. Grewell and he agrees to proceed.      Medication Adjustments/Labs and Tests Ordered: Current medicines are reviewed at length with the patient today.  Concerns regarding medicines are outlined above.  Orders Placed This Encounter  Procedures   EKG 12-Lead   No orders of the defined types were placed in this encounter.   Patient Instructions  Medication Instructions:  Your physician recommends that you continue on your current medications as directed. Please refer to the Current Medication list given to you today.  *If you need a refill on your cardiac medications before your next appointment, please call your pharmacy*   Follow-Up: At [facility], you and your health needs are our priority.  As part  of our continuing mission to provide you with exceptional heart care, we have created designated Provider Care Teams.  These Care Teams include your primary Cardiologist (physician) and Advanced Practice Providers (APPs -  Physician Assistants and Nurse Practitioners) who all work together to provide you with the care you need, when you need it.  We recommend signing up for the patient portal called "MyChart".  Sign up information is provided on this After Visit Summary.  MyChart is used to connect with patients for Virtual Visits (Telemedicine).  Patients are able to view lab/test results, encounter notes, upcoming appointments, etc.  Non-urgent messages can be sent to your provider as well.   To learn more about what you can do with MyChart, go to [email or web address].    Your next appointment:   6 month(s)  The format for your next appointment:   In Person  Provider:   Sanda Klein, MD   Signed, Sanda Klein, MD  09/10/2022 4:04 PM    [place]

## 2022-09-10 NOTE — Telephone Encounter (Signed)
Left message for pt letting him know that per Dr. Sallyanne Kuster would like to get an echocardiogram in about 3 months. Order placed. Advised pt to call back with questions or concerns.

## 2022-09-11 ENCOUNTER — Other Ambulatory Visit: Payer: Self-pay

## 2022-09-15 ENCOUNTER — Ambulatory Visit: Payer: No Typology Code available for payment source

## 2022-09-15 ENCOUNTER — Other Ambulatory Visit: Payer: No Typology Code available for payment source

## 2022-09-15 LAB — MULTIPLE MYELOMA PANEL, SERUM
Albumin SerPl Elph-Mcnc: 3.4 g/dL (ref 2.9–4.4)
Albumin/Glob SerPl: 1.4 (ref 0.7–1.7)
Alpha 1: 0.3 g/dL (ref 0.0–0.4)
Alpha2 Glob SerPl Elph-Mcnc: 0.8 g/dL (ref 0.4–1.0)
B-Globulin SerPl Elph-Mcnc: 1 g/dL (ref 0.7–1.3)
Gamma Glob SerPl Elph-Mcnc: 0.4 g/dL (ref 0.4–1.8)
Globulin, Total: 2.5 g/dL (ref 2.2–3.9)
IgA: 22 mg/dL — ABNORMAL LOW (ref 61–437)
IgG (Immunoglobin G), Serum: 481 mg/dL — ABNORMAL LOW (ref 603–1613)
IgM (Immunoglobulin M), Srm: 13 mg/dL — ABNORMAL LOW (ref 15–143)
Total Protein ELP: 5.9 g/dL — ABNORMAL LOW (ref 6.0–8.5)

## 2022-09-22 ENCOUNTER — Ambulatory Visit: Payer: No Typology Code available for payment source | Admitting: Hematology and Oncology

## 2022-09-22 ENCOUNTER — Inpatient Hospital Stay: Payer: No Typology Code available for payment source

## 2022-09-22 ENCOUNTER — Telehealth: Payer: Self-pay

## 2022-09-22 ENCOUNTER — Ambulatory Visit: Payer: No Typology Code available for payment source

## 2022-09-22 ENCOUNTER — Other Ambulatory Visit: Payer: No Typology Code available for payment source

## 2022-09-22 ENCOUNTER — Other Ambulatory Visit: Payer: Self-pay

## 2022-09-22 VITALS — BP 145/71 | HR 64 | Temp 98.7°F | Resp 16 | Wt 227.0 lb

## 2022-09-22 DIAGNOSIS — Z5112 Encounter for antineoplastic immunotherapy: Secondary | ICD-10-CM | POA: Diagnosis not present

## 2022-09-22 DIAGNOSIS — C9000 Multiple myeloma not having achieved remission: Secondary | ICD-10-CM

## 2022-09-22 LAB — LACTATE DEHYDROGENASE: LDH: 249 U/L — ABNORMAL HIGH (ref 98–192)

## 2022-09-22 LAB — CBC WITH DIFFERENTIAL (CANCER CENTER ONLY)
Abs Immature Granulocytes: 0.01 10*3/uL (ref 0.00–0.07)
Basophils Absolute: 0.1 10*3/uL (ref 0.0–0.1)
Basophils Relative: 1 %
Eosinophils Absolute: 0.3 10*3/uL (ref 0.0–0.5)
Eosinophils Relative: 5 %
HCT: 33.7 % — ABNORMAL LOW (ref 39.0–52.0)
Hemoglobin: 11.3 g/dL — ABNORMAL LOW (ref 13.0–17.0)
Immature Granulocytes: 0 %
Lymphocytes Relative: 15 %
Lymphs Abs: 0.7 10*3/uL (ref 0.7–4.0)
MCH: 34.6 pg — ABNORMAL HIGH (ref 26.0–34.0)
MCHC: 33.5 g/dL (ref 30.0–36.0)
MCV: 103.1 fL — ABNORMAL HIGH (ref 80.0–100.0)
Monocytes Absolute: 0.7 10*3/uL (ref 0.1–1.0)
Monocytes Relative: 14 %
Neutro Abs: 3.3 10*3/uL (ref 1.7–7.7)
Neutrophils Relative %: 65 %
Platelet Count: 218 10*3/uL (ref 150–400)
RBC: 3.27 MIL/uL — ABNORMAL LOW (ref 4.22–5.81)
RDW: 14.3 % (ref 11.5–15.5)
WBC Count: 5.1 10*3/uL (ref 4.0–10.5)
nRBC: 0 % (ref 0.0–0.2)

## 2022-09-22 LAB — CMP (CANCER CENTER ONLY)
ALT: 16 U/L (ref 0–44)
AST: 24 U/L (ref 15–41)
Albumin: 3.7 g/dL (ref 3.5–5.0)
Alkaline Phosphatase: 64 U/L (ref 38–126)
Anion gap: 7 (ref 5–15)
BUN: 14 mg/dL (ref 8–23)
CO2: 26 mmol/L (ref 22–32)
Calcium: 9.2 mg/dL (ref 8.9–10.3)
Chloride: 107 mmol/L (ref 98–111)
Creatinine: 1.82 mg/dL — ABNORMAL HIGH (ref 0.61–1.24)
GFR, Estimated: 38 mL/min — ABNORMAL LOW (ref >60)
Glucose, Bld: 113 mg/dL — ABNORMAL HIGH (ref 70–99)
Potassium: 3.9 mmol/L (ref 3.5–5.1)
Sodium: 140 mmol/L (ref 135–145)
Total Bilirubin: 0.6 mg/dL (ref 0.3–1.2)
Total Protein: 6.2 g/dL — ABNORMAL LOW (ref 6.5–8.1)

## 2022-09-22 MED ORDER — BORTEZOMIB CHEMO SQ INJECTION 3.5 MG (2.5MG/ML)
1.3000 mg/m2 | Freq: Once | INTRAMUSCULAR | Status: AC
  Administered 2022-09-22: 3 mg via SUBCUTANEOUS
  Filled 2022-09-22: qty 1.2

## 2022-09-22 MED ORDER — PROCHLORPERAZINE MALEATE 10 MG PO TABS
10.0000 mg | ORAL_TABLET | Freq: Once | ORAL | Status: AC
  Administered 2022-09-22: 10 mg via ORAL
  Filled 2022-09-22: qty 1

## 2022-09-22 NOTE — Patient Instructions (Signed)
[facility]  Discharge Instructions: Thank you for choosing [facility] to provide your oncology and hematology care.   If you have a lab appointment with the [facility], please go directly to the [facility] and check in at the registration area.   Wear comfortable clothing and clothing appropriate for easy access to any Portacath or PICC line.   We strive to give you quality time with your provider. You may need to reschedule your appointment if you arrive late (15 or more minutes).  Arriving late affects you and other patients whose appointments are after yours.  Also, if you miss three or more appointments without notifying the office, you may be dismissed from the clinic at the provider's discretion.      For prescription refill requests, have your pharmacy contact our office and allow 72 hours for refills to be completed.    Today you received the following chemotherapy and/or immunotherapy agents : Velcade      To help prevent nausea and vomiting after your treatment, we encourage you to take your nausea medication as directed.  BELOW ARE SYMPTOMS THAT SHOULD BE REPORTED IMMEDIATELY: *FEVER GREATER THAN 100.4 F (38 C) OR HIGHER *CHILLS OR SWEATING *NAUSEA AND VOMITING THAT IS NOT CONTROLLED WITH YOUR NAUSEA MEDICATION *UNUSUAL SHORTNESS OF BREATH *UNUSUAL BRUISING OR BLEEDING *URINARY PROBLEMS (pain or burning when urinating, or frequent urination) *BOWEL PROBLEMS (unusual diarrhea, constipation, pain near the anus) TENDERNESS IN MOUTH AND THROAT WITH OR WITHOUT PRESENCE OF ULCERS (sore throat, sores in mouth, or a toothache) UNUSUAL RASH, SWELLING OR PAIN  UNUSUAL VAGINAL DISCHARGE OR ITCHING   Items with * indicate a potential emergency and should be followed up as soon as possible or go to the Emergency Department if any problems should occur.  Please show the CHEMOTHERAPY ALERT CARD or IMMUNOTHERAPY ALERT CARD at check-in to  the Emergency Department and triage nurse.  Should you have questions after your visit or need to cancel or reschedule your appointment, please contact [facility]  Dept: [phone]  and follow the prompts.  Office hours are 8:00 a.m. to 4:30 p.m. Monday - Friday. Please note that voicemails left after 4:00 p.m. may not be returned until the following business day.  We are closed weekends and major holidays. You have access to a nurse at all times for urgent questions. Please call the main number to the clinic Dept: [phone] and follow the prompts.   For any non-urgent questions, you may also contact your provider using MyChart. We now offer e-Visits for anyone 69 and older to request care online for non-urgent symptoms. For details visit [email or web address].   Also download the MyChart app! Go to the app store, search "MyChart", open the app, select [facility], and log in with your MyChart username and password.

## 2022-09-22 NOTE — Telephone Encounter (Signed)
D1C2 Velcade with creat 1.82. Okay to treat per Dr. [name].

## 2022-09-23 ENCOUNTER — Other Ambulatory Visit: Payer: No Typology Code available for payment source

## 2022-09-23 ENCOUNTER — Ambulatory Visit: Payer: No Typology Code available for payment source

## 2022-09-23 LAB — KAPPA/LAMBDA LIGHT CHAINS
Kappa free light chain: 14.3 mg/L (ref 3.3–19.4)
Kappa, lambda light chain ratio: 1.54 (ref 0.26–1.65)
Lambda free light chains: 9.3 mg/L (ref 5.7–26.3)

## 2022-09-26 LAB — MULTIPLE MYELOMA PANEL, SERUM
Albumin SerPl Elph-Mcnc: 3.5 g/dL (ref 2.9–4.4)
Albumin/Glob SerPl: 1.6 (ref 0.7–1.7)
Alpha 1: 0.2 g/dL (ref 0.0–0.4)
Alpha2 Glob SerPl Elph-Mcnc: 0.7 g/dL (ref 0.4–1.0)
B-Globulin SerPl Elph-Mcnc: 0.8 g/dL (ref 0.7–1.3)
Gamma Glob SerPl Elph-Mcnc: 0.5 g/dL (ref 0.4–1.8)
Globulin, Total: 2.2 g/dL (ref 2.2–3.9)
IgA: 19 mg/dL — ABNORMAL LOW (ref 61–437)
IgG (Immunoglobin G), Serum: 472 mg/dL — ABNORMAL LOW (ref 603–1613)
IgM (Immunoglobulin M), Srm: 17 mg/dL (ref 15–143)
Total Protein ELP: 5.7 g/dL — ABNORMAL LOW (ref 6.0–8.5)

## 2022-09-30 ENCOUNTER — Ambulatory Visit: Payer: No Typology Code available for payment source | Admitting: Physician Assistant

## 2022-10-03 ENCOUNTER — Telehealth: Payer: Self-pay | Admitting: Hematology and Oncology

## 2022-10-03 NOTE — Telephone Encounter (Signed)
Returned patient's phone call regarding inbasket message. Informed patient I'm unable to reschedule upcoming appointments, patient is notified.

## 2022-10-07 ENCOUNTER — Inpatient Hospital Stay (HOSPITAL_BASED_OUTPATIENT_CLINIC_OR_DEPARTMENT_OTHER): Payer: No Typology Code available for payment source | Admitting: Physician Assistant

## 2022-10-07 ENCOUNTER — Other Ambulatory Visit: Payer: No Typology Code available for payment source

## 2022-10-07 ENCOUNTER — Inpatient Hospital Stay: Payer: No Typology Code available for payment source

## 2022-10-07 ENCOUNTER — Telehealth: Payer: Self-pay

## 2022-10-07 ENCOUNTER — Ambulatory Visit: Payer: No Typology Code available for payment source

## 2022-10-07 VITALS — BP 158/89 | HR 77 | Temp 97.9°F | Resp 15 | Wt 231.5 lb

## 2022-10-07 DIAGNOSIS — G629 Polyneuropathy, unspecified: Secondary | ICD-10-CM | POA: Diagnosis not present

## 2022-10-07 DIAGNOSIS — C9000 Multiple myeloma not having achieved remission: Secondary | ICD-10-CM

## 2022-10-07 DIAGNOSIS — Z5112 Encounter for antineoplastic immunotherapy: Secondary | ICD-10-CM | POA: Diagnosis not present

## 2022-10-07 LAB — CMP (CANCER CENTER ONLY)
ALT: 16 U/L (ref 0–44)
AST: 26 U/L (ref 15–41)
Albumin: 4 g/dL (ref 3.5–5.0)
Alkaline Phosphatase: 64 U/L (ref 38–126)
Anion gap: 6 (ref 5–15)
BUN: 14 mg/dL (ref 8–23)
CO2: 27 mmol/L (ref 22–32)
Calcium: 9.5 mg/dL (ref 8.9–10.3)
Chloride: 107 mmol/L (ref 98–111)
Creatinine: 1.88 mg/dL — ABNORMAL HIGH (ref 0.61–1.24)
GFR, Estimated: 36 mL/min — ABNORMAL LOW (ref >60)
Glucose, Bld: 108 mg/dL — ABNORMAL HIGH (ref 70–99)
Potassium: 3.9 mmol/L (ref 3.5–5.1)
Sodium: 140 mmol/L (ref 135–145)
Total Bilirubin: 0.6 mg/dL (ref 0.3–1.2)
Total Protein: 6.7 g/dL (ref 6.5–8.1)

## 2022-10-07 LAB — CBC WITH DIFFERENTIAL (CANCER CENTER ONLY)
Abs Immature Granulocytes: 0.02 10*3/uL (ref 0.00–0.07)
Basophils Absolute: 0 10*3/uL (ref 0.0–0.1)
Basophils Relative: 1 %
Eosinophils Absolute: 0.4 10*3/uL (ref 0.0–0.5)
Eosinophils Relative: 6 %
HCT: 33.3 % — ABNORMAL LOW (ref 39.0–52.0)
Hemoglobin: 11 g/dL — ABNORMAL LOW (ref 13.0–17.0)
Immature Granulocytes: 0 %
Lymphocytes Relative: 16 %
Lymphs Abs: 0.9 10*3/uL (ref 0.7–4.0)
MCH: 34.3 pg — ABNORMAL HIGH (ref 26.0–34.0)
MCHC: 33 g/dL (ref 30.0–36.0)
MCV: 103.7 fL — ABNORMAL HIGH (ref 80.0–100.0)
Monocytes Absolute: 0.7 10*3/uL (ref 0.1–1.0)
Monocytes Relative: 11 %
Neutro Abs: 3.8 10*3/uL (ref 1.7–7.7)
Neutrophils Relative %: 66 %
Platelet Count: 230 10*3/uL (ref 150–400)
RBC: 3.21 MIL/uL — ABNORMAL LOW (ref 4.22–5.81)
RDW: 15.1 % (ref 11.5–15.5)
WBC Count: 5.8 10*3/uL (ref 4.0–10.5)
nRBC: 0 % (ref 0.0–0.2)

## 2022-10-07 LAB — LACTATE DEHYDROGENASE: LDH: 263 U/L — ABNORMAL HIGH (ref 98–192)

## 2022-10-07 MED ORDER — PROCHLORPERAZINE MALEATE 10 MG PO TABS
10.0000 mg | ORAL_TABLET | Freq: Once | ORAL | Status: AC
  Administered 2022-10-07: 10 mg via ORAL
  Filled 2022-10-07: qty 1

## 2022-10-07 MED ORDER — BORTEZOMIB CHEMO SQ INJECTION 3.5 MG (2.5MG/ML)
1.3000 mg/m2 | Freq: Once | INTRAMUSCULAR | Status: AC
  Administered 2022-10-07: 3 mg via SUBCUTANEOUS
  Filled 2022-10-07: qty 1.2

## 2022-10-07 NOTE — Progress Notes (Signed)
Per Murray Hodgkins PA, okay to treat with creatinine 1.88

## 2022-10-07 NOTE — Telephone Encounter (Signed)
Left a message for Mr Pienta that he can discontinue the allopurinol per Dr. Libby Maw recommendations

## 2022-10-07 NOTE — Progress Notes (Signed)
[name] [phone]   [phone]  PROGRESS NOTE  Patient Care Team: [name], MD as PCP - General (Internal Medicine)  Hematological/Oncological History # IgA Lambda Multiple Myeloma 05/03/2015: last visit with Dr. Julien Nordmann at the [facility]. Was followed for IgA lambda MGUS. 12/11/2021: labs show M protein 3.4, Kappa 19.2, Lambda 1576.4, ratio 0.01. Cr 3.47, Hgb 8.6, WBC 5.7, MCV 99, Plt 221 12/23/2021: establish care with Dr. [name]  01/16/2022: Bone marrow biopsy performed, showed a 60% cellular bone marrow predominantly comprised of plasma cells making up 70 to 80%, lambda restricted.  Myeloma FISH panel showed no evidence of abnormalities. 02/07/2022: Cycle 1 Day 1 of CyBorD chemotherapy.  03/09/2022-03/17/2022: hospitalized for fever/pneumonia.  03/24/2022: Cycle 2 Day 1 of CyBorD chemotherapy.  04/22/2022: Cycle 3 Day 1 of CyBorD chemotherapy 05/19/2022-05/26/2022: Delayed start of Cycle 4 Day 1 of CyBorD chemotherapy due to neutropenia.  06/03/2022: Cycle 4 Day 1 of CyBorD chemotherapy. 25% dose reduction of cyclophosphamide due to cytopenias. 06/30/2022: Cycle 4 Day 22. Drop Velcade dose to '1mg'$ /m2 and Cyclophosphamide 200 mg/m2  07/07/2022: Cycle 5 Day 1 of CyBorD chemotherapy with dose reductions.  08/04/2022: Cycle 6 Day 1 of CyBorD chemotherapy with dose reductions.  09/09/2022: transition to maintenance dose Velcade q 2 weeks.   Interval History:  Darren Hinton. 77 y.o. male with medical history significant for IgA lambda multiple myeloma who presents for a follow up visit. The patient's last visit was on 09/09/2022. In the interim since the last visit he has continued on maintenance Velcade.   On exam today Darren Hinton reports he is feeling well without any major changes to his health. His energy levels are stable and is able to complete his ADLs. He struggles with low back pain and plans to start PT soon. He has persistent neuropathy  in his legs starting below the knee. He reports the neuropathy affects his balance. He is compliant with taking gabapentin and tramadol as prescribed. He denies nausea, vomiting or abdominal pain. Bowel habits are unchanged without recurrent episodes of diarrhea or constipation. He denies easy bruising or signs of active bleeding. He has stable ankle edema that improves with elevation. He denies fevers, chills, sweats, shortness of breath, chest pain or cough. He has no other complaints. Rest of the 10 point ROS is below.   MEDICAL HISTORY:  Past Medical History:  Diagnosis Date   Allergy    Anxiety    Arthritis    Depression    Heart murmur    Hyperlipidemia    Hyperplastic colon polyp    Hyperproteinemia    Hypertension    Hypothyroidism    Mitral valve prolapse    Paroxysmal atrial fibrillation (HCC)    Thyroid disease     SURGICAL HISTORY: Past Surgical History:  Procedure Laterality Date   CARDIOVERSION N/A 03/14/2022   Procedure: CARDIOVERSION;  Surgeon: Pixie Casino, MD;  Location: [facility];  Service: Cardiovascular;  Laterality: N/A;   TEE WITHOUT CARDIOVERSION N/A 03/14/2022   Procedure: TRANSESOPHAGEAL ECHOCARDIOGRAM (TEE);  Surgeon: Pixie Casino, MD;  Location: [facility] ENDOSCOPY;  Service: Cardiovascular;  Laterality: N/A;    SOCIAL HISTORY: Social History   Socioeconomic History   Marital status: Married    Spouse name: Not on file   Number of children: Not on file   Years of education: Not on file   Highest education level: Not on file  Occupational History   Not on file  Tobacco Use  Smoking status: Former    Years: 20.00    Types: Cigarettes    Quit date: 10/01/1978    Years since quitting: 44.0   Smokeless tobacco: Never   Tobacco comments:    Former smoker 04/03/22  Substance and Sexual Activity   Alcohol use: Yes    Alcohol/week: 2.0 standard drinks of alcohol    Types: 2 Glasses of wine per week    Comment: occasionally   Drug use: No    Sexual activity: Not on file  Other Topics Concern   Not on file  Social History Narrative   Not on file   Social Determinants of Health   Financial Resource Strain: Not on file  Food Insecurity: Not on file  Transportation Needs: Not on file  Physical Activity: Not on file  Stress: Not on file  Social Connections: Not on file  Intimate Partner Violence: Not on file    FAMILY HISTORY: Family History  Problem Relation Age of Onset   Cancer Father    Colon cancer Neg Hx    Esophageal cancer Neg Hx    Liver cancer Neg Hx    Pancreatic cancer Neg Hx    Rectal cancer Neg Hx    Stomach cancer Neg Hx     ALLERGIES:  is allergic to zithromax [azithromycin].  MEDICATIONS:  Current Outpatient Medications  Medication Sig Dispense Refill   acyclovir (ZOVIRAX) 400 MG tablet TAKE 1 TABLET BY MOUTH TWICE A DAY 180 tablet 1   allopurinol (ZYLOPRIM) 300 MG tablet TAKE 1 TABLET BY MOUTH DAILY 100 tablet 2   amiodarone (PACERONE) 200 MG tablet TAKE 1 TABLET BY MOUTH EVERY DAY 90 tablet 3   amLODipine (NORVASC) 5 MG tablet TAKE 1 TABLET (5 MG TOTAL) BY MOUTH DAILY. 90 tablet 3   apixaban (ELIQUIS) 5 MG TABS tablet Take 1 tablet (5 mg total) by mouth 2 (two) times daily. 180 tablet 1   atorvastatin (LIPITOR) 20 MG tablet Take 20 mg by mouth daily in the afternoon.     dexamethasone (DECADRON) 4 MG tablet TAKE 10 TABLETS (40 MG TOTAL) BY MOUTH ONCE A WEEK IN THE MORNING ON CHEMOTHERAPY DAYS 120 tablet 1   ergocalciferol (VITAMIN D2) 1.25 MG (50000 UT) capsule Take 50,000 Units by mouth every Wednesday.     ferrous gluconate (FERGON) 324 MG tablet Take 1 tablet (324 mg total) by mouth 3 (three) times daily with meals. 90 tablet 0   fluticasone (FLONASE) 50 MCG/ACT nasal spray Place 2 sprays into the nose daily as needed for allergies.     gabapentin (NEURONTIN) 300 MG capsule Take 1 capsule (300 mg total) by mouth 2 (two) times daily. 60 capsule 1   levothyroxine (SYNTHROID) 100 MCG tablet Take  100 mcg by mouth See admin instructions. Take 1 tablet (100 mcg) by mouth on Mondays, Tuesdays, Wednesdays, Thursdays, Fridays & Saturdays in the morning. Skip a dose on Sundays.     loratadine (CLARITIN) 10 MG tablet Take 10 mg by mouth daily as needed for allergies.     meclizine (ANTIVERT) 25 MG tablet Take 1 tablet (25 mg total) by mouth 3 (three) times daily as needed for dizziness. 30 tablet 0   metoprolol succinate (TOPROL-XL) 25 MG 24 hr tablet Take 1 tablet (25 mg total) by mouth daily. Take with or immediately following a meal. 30 tablet 3   prochlorperazine (COMPAZINE) 10 MG tablet Take 1 tablet (10 mg total) by mouth every 6 (six) hours as needed for nausea  or vomiting. 30 tablet 0   senna-docusate (SENOKOT-S) 8.6-50 MG tablet Take 1 tablet by mouth at bedtime as needed for mild constipation.     traMADol (ULTRAM) 50 MG tablet Take 1 tablet (50 mg total) by mouth every 6 (six) hours as needed. 30 tablet 0   vitamin B-12 (CYANOCOBALAMIN) 500 MCG tablet Take 500 mcg by mouth in the morning.     ALPRAZolam (XANAX) 0.25 MG tablet Take 0.25 mg by mouth 2 (two) times daily as needed for anxiety. (Patient not taking: Reported on 09/10/2022)     Current Facility-Administered Medications  Medication Dose Route Frequency Provider Last Rate Last Admin   0.9 %  sodium chloride infusion  500 mL Intravenous Once Armbruster, Carlota Raspberry, MD        REVIEW OF SYSTEMS:   Constitutional: ( - ) fevers, ( - )  chills , ( - ) night sweats Eyes: ( - ) blurriness of vision, ( - ) double vision, ( - ) watery eyes Ears, nose, mouth, throat, and face: ( - ) mucositis, ( - ) sore throat Respiratory: ( - ) cough, ( - ) dyspnea, ( - ) wheezes Cardiovascular: ( - ) palpitation, ( - ) chest discomfort, (+ ) lower extremity swelling Gastrointestinal:  ( - ) nausea, ( - ) heartburn, ( - ) change in bowel habits Skin: ( - ) abnormal skin rashes Lymphatics: ( - ) new lymphadenopathy, ( - ) easy bruising Neurological: (  +- ) numbness, ( - ) tingling, ( - ) new weaknesses Behavioral/Psych: ( - ) mood change, ( - ) new changes  All other systems were reviewed with the patient and are negative.  PHYSICAL EXAMINATION: ECOG PERFORMANCE STATUS: 1 - Symptomatic but completely ambulatory  Vitals:   10/07/22 1314  BP: (!) 158/89  Pulse: 77  Resp: 15  Temp: 97.9 F (36.6 C)  SpO2: 95%       Filed Weights   10/07/22 1314  Weight: 231 lb 8 oz (105 kg)     GENERAL: Chronically ill-appearing elderly African-American male, alert, no distress and comfortable SKIN: skin color, texture, turgor are normal, no rashes or significant lesions EYES: conjunctiva are pink and non-injected, sclera clear LUNGS: clear to auscultation and percussion with normal breathing effort HEART: regular rate & rhythm and no murmurs and bilateral ankle edema Musculoskeletal: no cyanosis of digits and no clubbing  PSYCH: alert & oriented x 3, fluent speech NEURO: no focal motor/sensory deficits  LABORATORY DATA:  I have reviewed the data as listed    Latest Ref Rng & Units 10/07/2022    1:01 PM 09/22/2022    8:15 AM 09/09/2022    8:29 AM  CBC  WBC 4.0 - 10.5 K/uL 5.8  5.1  5.2   Hemoglobin 13.0 - 17.0 g/dL 11.0  11.3  11.7   Hematocrit 39.0 - 52.0 % 33.3  33.7  35.4   Platelets 150 - 400 K/uL 230  218  229        Latest Ref Rng & Units 10/07/2022    1:01 PM 09/22/2022    8:15 AM 09/09/2022    8:29 AM  CMP  Glucose 70 - 99 mg/dL 108  113  98   BUN 8 - 23 mg/dL '14  14  15   '$ Creatinine 0.61 - 1.24 mg/dL 1.88  1.82  1.79   Sodium 135 - 145 mmol/L 140  140  140   Potassium 3.5 - 5.1 mmol/L 3.9  3.9  4.2   Chloride 98 - 111 mmol/L 107  107  107   CO2 22 - 32 mmol/L '27  26  26   '$ Calcium 8.9 - 10.3 mg/dL 9.5  9.2  9.2   Total Protein 6.5 - 8.1 g/dL 6.7  6.2  6.8   Total Bilirubin 0.3 - 1.2 mg/dL 0.6  0.6  0.5   Alkaline Phos 38 - 126 U/L 64  64  66   AST 15 - 41 U/L '26  24  23   '$ ALT 0 - 44 U/L '16  16  17     '$ Lab Results   Component Value Date   MPROTEIN Not Observed 09/22/2022   MPROTEIN Not Observed 09/09/2022   MPROTEIN Not Observed 08/04/2022   Lab Results  Component Value Date   KPAFRELGTCHN 14.3 09/22/2022   KPAFRELGTCHN 15.1 09/09/2022   KPAFRELGTCHN 12.1 08/04/2022   LAMBDASER 9.3 09/22/2022   LAMBDASER 8.9 09/09/2022   LAMBDASER 5.6 (L) 08/04/2022   KAPLAMBRATIO 1.54 09/22/2022   KAPLAMBRATIO 1.70 (H) 09/09/2022   KAPLAMBRATIO 2.16 (H) 08/04/2022    RADIOGRAPHIC STUDIES: No results found.  ASSESSMENT & PLAN Darren Hinton. Is a 77 y.o. male with medical history significant for IgA lambda multiple myeloma who presents for a follow up visit. .    # IgA lambda Multiple Myeloma -- Bone marrow biopsy performed on 01/16/2022 showed increased plasma cells consistent with a plasma cell neoplasm.  Normal FISH panel. --Patient meets diagnostic criteria based on kidney dysfunction and anemia. --Cycle 1 Day 1 of CyBorD chemotherapy started on 02/07/2022 --Velcade maintenance therapy started on 09/09/2022.  Plan: --Most recent SPEP from 09/22/2022 showed M protein 0.0 g/dL, kappa 14.3, lambda 9.3, ratio 1.54. This is a robust response to therapy. Labs repeated today.  --Labs today show white blood cell 5.8, hemoglobin 11.0, MCV 103.7, and platelets of 230 --continue on maintenance Velcade every 2 weeks with monthly clinic visits.  # Leg Pain -- Likely a component of worsening neuropathy from his chemotherapy with pre-existing pain. -- continue gabapentin to 300 mg twice daily -- continue tramadol 50 mg every 6 hours as needed -- sent referral to Dr. Mickeal Skinner for further recommendations.   #Supportive Care -- chemotherapy education complete -- port placement not required.  -- zofran '8mg'$  q8H PRN and compazine '10mg'$  PO q6H for nausea -- acyclovir '400mg'$  PO BID for VCZ prophylaxis -- allopurinol '300mg'$  can be d/c starting today --zometa to start after dental clearance.   Orders Placed This Encounter   Procedures   Amb Referral to Neuro Oncology    Referral Priority:   Routine    Referral Type:   Consultation    Referral Reason:   Specialty Services Required    Number of Visits Requested:   1   All questions were answered. The patient knows to call the clinic with any problems, questions or concerns.  I have spent a total of 30 minutes minutes of face-to-face and non-face-to-face time, preparing to see the patient, performing a medically appropriate examination, counseling and educating the patient, documenting clinical information in the electronic health record,  and care coordination.   Dede Query PA-C Dept of Hematology and [place] at [facility] ([place]) Phone: [phone]   10/07/2022 3:49 PM

## 2022-10-07 NOTE — Patient Instructions (Signed)
[facility]  Discharge Instructions: Thank you for choosing [facility] to provide your oncology and hematology care.   If you have a lab appointment with the Cancer Center, please go directly to the Cancer Center and check in at the registration area.   Wear comfortable clothing and clothing appropriate for easy access to any Portacath or PICC line.   We strive to give you quality time with your provider. You may need to reschedule your appointment if you arrive late (15 or more minutes).  Arriving late affects you and other patients whose appointments are after yours.  Also, if you miss three or more appointments without notifying the office, you may be dismissed from the clinic at the provider's discretion.      For prescription refill requests, have your pharmacy contact our office and allow 72 hours for refills to be completed.    Today you received the following chemotherapy and/or immunotherapy agents: Velcade        To help prevent nausea and vomiting after your treatment, we encourage you to take your nausea medication as directed.  BELOW ARE SYMPTOMS THAT SHOULD BE REPORTED IMMEDIATELY: *FEVER GREATER THAN 100.4 F (38 C) OR HIGHER *CHILLS OR SWEATING *NAUSEA AND VOMITING THAT IS NOT CONTROLLED WITH YOUR NAUSEA MEDICATION *UNUSUAL SHORTNESS OF BREATH *UNUSUAL BRUISING OR BLEEDING *URINARY PROBLEMS (pain or burning when urinating, or frequent urination) *BOWEL PROBLEMS (unusual diarrhea, constipation, pain near the anus) TENDERNESS IN MOUTH AND THROAT WITH OR WITHOUT PRESENCE OF ULCERS (sore throat, sores in mouth, or a toothache) UNUSUAL RASH, SWELLING OR PAIN  UNUSUAL VAGINAL DISCHARGE OR ITCHING   Items with * indicate a potential emergency and should be followed up as soon as possible or go to the Emergency Department if any problems should occur.  Please show the CHEMOTHERAPY ALERT CARD or IMMUNOTHERAPY ALERT CARD at  check-in to the Emergency Department and triage nurse.  Should you have questions after your visit or need to cancel or reschedule your appointment, please contact [facility]  Dept: [phone]  and follow the prompts.  Office hours are 8:00 a.m. to 4:30 p.m. Monday - Friday. Please note that voicemails left after 4:00 p.m. may not be returned until the following business day.  We are closed weekends and major holidays. You have access to a nurse at all times for urgent questions. Please call the main number to the clinic Dept: [phone] and follow the prompts.   For any non-urgent questions, you may also contact your provider using MyChart. We now offer e-Visits for anyone 18 and older to request care online for non-urgent symptoms. For details visit [email or web address].   Also download the MyChart app! Go to the app store, search "MyChart", open the app, select [facility], and log in with your MyChart username and password.  

## 2022-10-08 ENCOUNTER — Telehealth: Payer: Self-pay | Admitting: Internal Medicine

## 2022-10-08 LAB — KAPPA/LAMBDA LIGHT CHAINS
Kappa free light chain: 17.4 mg/L (ref 3.3–19.4)
Kappa, lambda light chain ratio: 1.72 — ABNORMAL HIGH (ref 0.26–1.65)
Lambda free light chains: 10.1 mg/L (ref 5.7–26.3)

## 2022-10-08 NOTE — Telephone Encounter (Signed)
Scheduled appt per 1/30 referral. Pt is aware of appt date and time. Pt is aware to arrive 15 mins prior to appt time and to bring and updated insurance card. Pt is aware of appt location.

## 2022-10-14 ENCOUNTER — Inpatient Hospital Stay: Payer: No Typology Code available for payment source | Attending: Hematology and Oncology | Admitting: Internal Medicine

## 2022-10-14 VITALS — BP 139/75 | HR 57 | Temp 98.1°F | Resp 18 | Ht 77.0 in | Wt 234.2 lb

## 2022-10-14 DIAGNOSIS — Z87891 Personal history of nicotine dependence: Secondary | ICD-10-CM | POA: Diagnosis not present

## 2022-10-14 DIAGNOSIS — Z7901 Long term (current) use of anticoagulants: Secondary | ICD-10-CM | POA: Insufficient documentation

## 2022-10-14 DIAGNOSIS — I341 Nonrheumatic mitral (valve) prolapse: Secondary | ICD-10-CM | POA: Insufficient documentation

## 2022-10-14 DIAGNOSIS — Z5112 Encounter for antineoplastic immunotherapy: Secondary | ICD-10-CM | POA: Diagnosis present

## 2022-10-14 DIAGNOSIS — Z7969 Long term (current) use of other immunomodulators and immunosuppressants: Secondary | ICD-10-CM | POA: Diagnosis not present

## 2022-10-14 DIAGNOSIS — Z881 Allergy status to other antibiotic agents status: Secondary | ICD-10-CM | POA: Diagnosis not present

## 2022-10-14 DIAGNOSIS — Z79624 Long term (current) use of inhibitors of nucleotide synthesis: Secondary | ICD-10-CM | POA: Insufficient documentation

## 2022-10-14 DIAGNOSIS — Z993 Dependence on wheelchair: Secondary | ICD-10-CM | POA: Insufficient documentation

## 2022-10-14 DIAGNOSIS — Z809 Family history of malignant neoplasm, unspecified: Secondary | ICD-10-CM | POA: Diagnosis not present

## 2022-10-14 DIAGNOSIS — C9000 Multiple myeloma not having achieved remission: Secondary | ICD-10-CM | POA: Diagnosis present

## 2022-10-14 DIAGNOSIS — G620 Drug-induced polyneuropathy: Secondary | ICD-10-CM

## 2022-10-14 DIAGNOSIS — T451X5A Adverse effect of antineoplastic and immunosuppressive drugs, initial encounter: Secondary | ICD-10-CM | POA: Diagnosis not present

## 2022-10-14 DIAGNOSIS — Z79899 Other long term (current) drug therapy: Secondary | ICD-10-CM | POA: Diagnosis not present

## 2022-10-14 DIAGNOSIS — Z5111 Encounter for antineoplastic chemotherapy: Secondary | ICD-10-CM | POA: Diagnosis present

## 2022-10-14 DIAGNOSIS — I480 Paroxysmal atrial fibrillation: Secondary | ICD-10-CM | POA: Insufficient documentation

## 2022-10-14 DIAGNOSIS — Z8719 Personal history of other diseases of the digestive system: Secondary | ICD-10-CM | POA: Insufficient documentation

## 2022-10-14 NOTE — Progress Notes (Signed)
[facility] at [place], [place] [address] [phone]   New Patient Evaluation  Date of Service: 10/14/22 Patient Name: Darren Hinton. Patient MRN: [id number] Patient DOB: [birth year] Provider: Ventura Sellers, MD  Identifying Statement:  Darren Prabhakar. is a 77 y.o. male with Chemotherapy-induced neuropathy [facility]) who presents for initial consultation and evaluation regarding cancer associated neurologic deficits.    Referring Provider: Lincoln Brigham, PA-C [place],  [place] [address]  Primary Cancer:  Oncologic History: Oncology History  Multiple myeloma not having achieved remission ([facility])  01/30/2022 Initial Diagnosis   Multiple myeloma not having achieved remission ([facility])   02/07/2022 - 05/05/2022 Chemotherapy   Patient is on Treatment Plan : MULTIPLE MYELOMA CyBorD - Weekly Bortezomib q28d x 8 Cycles     02/07/2022 - 08/25/2022 Chemotherapy   Patient is on Treatment Plan : MULTIPLE MYELOMA CyBorD - Weekly Bortezomib q28d x 8 Cycles     09/09/2022 -  Chemotherapy   Patient is on Treatment Plan : MYELOMA MAINTENANCE Bortezomib SQ q14d       History of Present Illness: The patient's records from the referring physician were obtained and reviewed and the patient interviewed to confirm this HPI.  Darren Sermon. Presents to review neuropathic symptoms.  He describes ~6 months history of progressive gait impairment, due to inability to feel and sense feet and legs underneath him.  There is no frank leg weakness.  He is currently confined to wheelchair, though dose some very slow and careful walking with the walker.  He does have some shooting pains down the backs of both legs, though this is more mild in nature and more frequent at night time.  He is currently dosing gabapentin '300mg'$  twice per day, it's unclear if this has helped at all.  Continues to undergo velcade injections with Dr. [name] for the  myeloma.  Medications: Current Outpatient Medications on File Prior to Visit  Medication Sig Dispense Refill   acyclovir (ZOVIRAX) 400 MG tablet TAKE 1 TABLET BY MOUTH TWICE A DAY 180 tablet 1   amiodarone (PACERONE) 200 MG tablet TAKE 1 TABLET BY MOUTH EVERY DAY 90 tablet 3   amLODipine (NORVASC) 5 MG tablet TAKE 1 TABLET (5 MG TOTAL) BY MOUTH DAILY. 90 tablet 3   apixaban (ELIQUIS) 5 MG TABS tablet Take 1 tablet (5 mg total) by mouth 2 (two) times daily. 180 tablet 1   atorvastatin (LIPITOR) 20 MG tablet Take 20 mg by mouth daily in the afternoon.     dexamethasone (DECADRON) 4 MG tablet TAKE 10 TABLETS (40 MG TOTAL) BY MOUTH ONCE A WEEK IN THE MORNING ON CHEMOTHERAPY DAYS 120 tablet 1   ergocalciferol (VITAMIN D2) 1.25 MG (50000 UT) capsule Take 50,000 Units by mouth every Wednesday.     ferrous gluconate (FERGON) 324 MG tablet Take 1 tablet (324 mg total) by mouth 3 (three) times daily with meals. 90 tablet 0   fluticasone (FLONASE) 50 MCG/ACT nasal spray Place 2 sprays into the nose daily as needed for allergies.     gabapentin (NEURONTIN) 300 MG capsule Take 1 capsule (300 mg total) by mouth 2 (two) times daily. 60 capsule 1   levothyroxine (SYNTHROID) 100 MCG tablet Take 100 mcg by mouth See admin instructions. Take 1 tablet (100 mcg) by mouth on Mondays, Tuesdays, Wednesdays, Thursdays, Fridays & Saturdays in the morning. Skip a dose on Sundays.  loratadine (CLARITIN) 10 MG tablet Take 10 mg by mouth daily as needed for allergies.     meclizine (ANTIVERT) 25 MG tablet Take 1 tablet (25 mg total) by mouth 3 (three) times daily as needed for dizziness. 30 tablet 0   metoprolol succinate (TOPROL-XL) 25 MG 24 hr tablet Take 1 tablet (25 mg total) by mouth daily. Take with or immediately following a meal. 30 tablet 3   prochlorperazine (COMPAZINE) 10 MG tablet Take 1 tablet (10 mg total) by mouth every 6 (six) hours as needed for nausea or vomiting. 30 tablet 0   senna-docusate (SENOKOT-S)  8.6-50 MG tablet Take 1 tablet by mouth at bedtime as needed for mild constipation.     traMADol (ULTRAM) 50 MG tablet Take 1 tablet (50 mg total) by mouth every 6 (six) hours as needed. 30 tablet 0   vitamin B-12 (CYANOCOBALAMIN) 500 MCG tablet Take 500 mcg by mouth in the morning.     ALPRAZolam (XANAX) 0.25 MG tablet Take 0.25 mg by mouth 2 (two) times daily as needed for anxiety. (Patient not taking: Reported on 09/10/2022)     Current Facility-Administered Medications on File Prior to Visit  Medication Dose Route Frequency Provider Last Rate Last Admin   0.9 %  sodium chloride infusion  500 mL Intravenous Once Armbruster, Carlota Raspberry, MD        Allergies:  Allergies  Allergen Reactions   Zithromax [Azithromycin] Nausea And Vomiting and Other (See Comments)    "Sick on the stomach"   Past Medical History:  Past Medical History:  Diagnosis Date   Allergy    Anxiety    Arthritis    Depression    Heart murmur    Hyperlipidemia    Hyperplastic colon polyp    Hyperproteinemia    Hypertension    Hypothyroidism    Mitral valve prolapse    Paroxysmal atrial fibrillation (HCC)    Thyroid disease    Past Surgical History:  Past Surgical History:  Procedure Laterality Date   CARDIOVERSION N/A 03/14/2022   Procedure: CARDIOVERSION;  Surgeon: Pixie Casino, MD;  Location: [facility];  Service: Cardiovascular;  Laterality: N/A;   TEE WITHOUT CARDIOVERSION N/A 03/14/2022   Procedure: TRANSESOPHAGEAL ECHOCARDIOGRAM (TEE);  Surgeon: Pixie Casino, MD;  Location: [facility] ENDOSCOPY;  Service: Cardiovascular;  Laterality: N/A;   Social History:  Social History   Socioeconomic History   Marital status: Married    Spouse name: Not on file   Number of children: Not on file   Years of education: Not on file   Highest education level: Not on file  Occupational History   Not on file  Tobacco Use   Smoking status: Former    Years: 20.00    Types: Cigarettes    Quit date: 10/01/1978     Years since quitting: 44.0   Smokeless tobacco: Never   Tobacco comments:    Former smoker 04/03/22  Substance and Sexual Activity   Alcohol use: Yes    Alcohol/week: 2.0 standard drinks of alcohol    Types: 2 Glasses of wine per week    Comment: occasionally   Drug use: No   Sexual activity: Not on file  Other Topics Concern   Not on file  Social History Narrative   Not on file   Social Determinants of Health   Financial Resource Strain: Not on file  Food Insecurity: Not on file  Transportation Needs: Not on file  Physical Activity: Not on file  Stress: Not on file  Social Connections: Not on file  Intimate Partner Violence: Not on file   Family History:  Family History  Problem Relation Age of Onset   Cancer Father    Colon cancer Neg Hx    Esophageal cancer Neg Hx    Liver cancer Neg Hx    Pancreatic cancer Neg Hx    Rectal cancer Neg Hx    Stomach cancer Neg Hx     Review of Systems: Constitutional: Doesn't report fevers, chills or abnormal weight loss Eyes: Doesn't report blurriness of vision Ears, nose, mouth, throat, and face: Doesn't report sore throat Respiratory: Doesn't report cough, dyspnea or wheezes Cardiovascular: Doesn't report palpitation, chest discomfort  Gastrointestinal:  Doesn't report nausea, constipation, diarrhea GU: Doesn't report incontinence Skin: Doesn't report skin rashes Neurological: Per HPI Musculoskeletal: Doesn't report joint pain Behavioral/Psych: Doesn't report anxiety  Physical Exam: Vitals:   10/14/22 1021  BP: 139/75  Pulse: (!) 57  Resp: 18  Temp: 98.1 F (36.7 C)  SpO2: 98%   KPS: 70. General: Alert, cooperative, pleasant, in no acute distress Head: Normal EENT: No conjunctival injection or scleral icterus.  Lungs: Resp effort normal Cardiac: Regular rate Abdomen: Non-distended abdomen Skin: No rashes cyanosis or petechiae. Extremities: No clubbing or edema  Neurologic Exam: Mental Status: Awake, alert,  attentive to examiner. Oriented to self and environment. Language is fluent with intact comprehension.  Cranial Nerves: Visual acuity is grossly normal. Visual fields are full. Extra-ocular movements intact. No ptosis. Face is symmetric Motor: Tone and bulk are normal. Power is full in both arms and legs. Reflexes are symmetric, no pathologic reflexes present.  Sensory: Impaired, below knee Gait: Non ambulatory   Labs: I have reviewed the data as listed    Component Value Date/Time   NA 140 10/07/2022 1301   NA 141 04/26/2015 0930   K 3.9 10/07/2022 1301   K 4.0 04/26/2015 0930   CL 107 10/07/2022 1301   CL 105 10/01/2012 0817   CO2 27 10/07/2022 1301   CO2 21 (L) 04/26/2015 0930   GLUCOSE 108 (H) 10/07/2022 1301   GLUCOSE 98 04/26/2015 0930   GLUCOSE 90 10/01/2012 0817   BUN 14 10/07/2022 1301   BUN 9.4 04/26/2015 0930   CREATININE 1.88 (H) 10/07/2022 1301   CREATININE 1.4 (H) 04/26/2015 0930   CALCIUM 9.5 10/07/2022 1301   CALCIUM 9.0 04/26/2015 0930   PROT 6.7 10/07/2022 1301   PROT 7.6 04/26/2015 0930   ALBUMIN 4.0 10/07/2022 1301   ALBUMIN 4.0 04/26/2015 0930   AST 26 10/07/2022 1301   AST 19 04/26/2015 0930   ALT 16 10/07/2022 1301   ALT 12 04/26/2015 0930   ALKPHOS 64 10/07/2022 1301   ALKPHOS 87 04/26/2015 0930   BILITOT 0.6 10/07/2022 1301   BILITOT 0.43 04/26/2015 0930   GFRNONAA 36 (L) 10/07/2022 1301   GFRAA 68 (L) 03/12/2014 1643   Lab Results  Component Value Date   WBC 5.8 10/07/2022   NEUTROABS 3.8 10/07/2022   HGB 11.0 (L) 10/07/2022   HCT 33.3 (L) 10/07/2022   MCV 103.7 (H) 10/07/2022   PLT 230 10/07/2022    Assessment/Plan Chemotherapy-induced neuropathy ([facility])  Darren Sermon. presents with clinical syndrome consistent with symmetric, length dependent, small and large fiber peripheral neuropathy.  Etiology is exposure to velcade chemotherapy.  His gait impairment and subjective hand weakness are secondary to this neuropathic process,  which we would consider grade 2.  We reviewed pathophysiology of  chemotherapy induced neuropathy, available treatments, and goals of care.  He may continue gabapentin '300mg'$  BID or increase to '600mg'$  BID, but he understands symptom burden is unlikely to improve while he remains on velcade.  We do not recommend he dose tramadol for this problem.  We spent twenty additional minutes teaching regarding the natural history, biology, and historical experience in the treatment of neurologic complications of cancer.   We appreciate the opportunity to participate in the care of Darren Hinton..   We ask that Darren Sermon. return to clinic in 2 months, or sooner as needed.  All questions were answered. The patient knows to call the clinic with any problems, questions or concerns. No barriers to learning were detected.  The total time spent in the encounter was 40 minutes and more than 50% was on counseling and review of test results   Ventura Sellers, MD Medical Director of Neuro-Oncology [facility] at [place] 10/14/22 11:21 AM

## 2022-10-16 LAB — MULTIPLE MYELOMA PANEL, SERUM
Albumin SerPl Elph-Mcnc: 3.8 g/dL (ref 2.9–4.4)
Albumin/Glob SerPl: 1.6 (ref 0.7–1.7)
Alpha 1: 0.3 g/dL (ref 0.0–0.4)
Alpha2 Glob SerPl Elph-Mcnc: 0.7 g/dL (ref 0.4–1.0)
B-Globulin SerPl Elph-Mcnc: 0.9 g/dL (ref 0.7–1.3)
Gamma Glob SerPl Elph-Mcnc: 0.4 g/dL (ref 0.4–1.8)
Globulin, Total: 2.4 g/dL (ref 2.2–3.9)
IgA: 22 mg/dL — ABNORMAL LOW (ref 61–437)
IgG (Immunoglobin G), Serum: 489 mg/dL — ABNORMAL LOW (ref 603–1613)
IgM (Immunoglobulin M), Srm: 21 mg/dL (ref 15–143)
Total Protein ELP: 6.2 g/dL (ref 6.0–8.5)

## 2022-10-20 ENCOUNTER — Inpatient Hospital Stay: Payer: No Typology Code available for payment source

## 2022-10-20 VITALS — BP 152/81 | HR 58 | Temp 98.5°F | Resp 18 | Wt 231.0 lb

## 2022-10-20 DIAGNOSIS — C9000 Multiple myeloma not having achieved remission: Secondary | ICD-10-CM | POA: Diagnosis not present

## 2022-10-20 LAB — CBC WITH DIFFERENTIAL (CANCER CENTER ONLY)
Abs Immature Granulocytes: 0.02 10*3/uL (ref 0.00–0.07)
Basophils Absolute: 0 10*3/uL (ref 0.0–0.1)
Basophils Relative: 1 %
Eosinophils Absolute: 0.2 10*3/uL (ref 0.0–0.5)
Eosinophils Relative: 5 %
HCT: 34.9 % — ABNORMAL LOW (ref 39.0–52.0)
Hemoglobin: 11.5 g/dL — ABNORMAL LOW (ref 13.0–17.0)
Immature Granulocytes: 1 %
Lymphocytes Relative: 23 %
Lymphs Abs: 1 10*3/uL (ref 0.7–4.0)
MCH: 34.5 pg — ABNORMAL HIGH (ref 26.0–34.0)
MCHC: 33 g/dL (ref 30.0–36.0)
MCV: 104.8 fL — ABNORMAL HIGH (ref 80.0–100.0)
Monocytes Absolute: 0.6 10*3/uL (ref 0.1–1.0)
Monocytes Relative: 14 %
Neutro Abs: 2.3 10*3/uL (ref 1.7–7.7)
Neutrophils Relative %: 56 %
Platelet Count: 201 10*3/uL (ref 150–400)
RBC: 3.33 MIL/uL — ABNORMAL LOW (ref 4.22–5.81)
RDW: 14.6 % (ref 11.5–15.5)
WBC Count: 4.2 10*3/uL (ref 4.0–10.5)
nRBC: 0 % (ref 0.0–0.2)

## 2022-10-20 LAB — CMP (CANCER CENTER ONLY)
ALT: 15 U/L (ref 0–44)
AST: 24 U/L (ref 15–41)
Albumin: 4.1 g/dL (ref 3.5–5.0)
Alkaline Phosphatase: 58 U/L (ref 38–126)
Anion gap: 6 (ref 5–15)
BUN: 15 mg/dL (ref 8–23)
CO2: 26 mmol/L (ref 22–32)
Calcium: 9.2 mg/dL (ref 8.9–10.3)
Chloride: 108 mmol/L (ref 98–111)
Creatinine: 1.99 mg/dL — ABNORMAL HIGH (ref 0.61–1.24)
GFR, Estimated: 34 mL/min — ABNORMAL LOW (ref >60)
Glucose, Bld: 85 mg/dL (ref 70–99)
Potassium: 4.4 mmol/L (ref 3.5–5.1)
Sodium: 140 mmol/L (ref 135–145)
Total Bilirubin: 0.7 mg/dL (ref 0.3–1.2)
Total Protein: 6.4 g/dL — ABNORMAL LOW (ref 6.5–8.1)

## 2022-10-20 LAB — LACTATE DEHYDROGENASE: LDH: 253 U/L — ABNORMAL HIGH (ref 98–192)

## 2022-10-20 MED ORDER — PROCHLORPERAZINE MALEATE 10 MG PO TABS
10.0000 mg | ORAL_TABLET | Freq: Once | ORAL | Status: AC
  Administered 2022-10-20: 10 mg via ORAL
  Filled 2022-10-20: qty 1

## 2022-10-20 MED ORDER — BORTEZOMIB CHEMO SQ INJECTION 3.5 MG (2.5MG/ML)
1.3000 mg/m2 | Freq: Once | INTRAMUSCULAR | Status: AC
  Administered 2022-10-20: 3 mg via SUBCUTANEOUS
  Filled 2022-10-20: qty 1.2

## 2022-10-20 NOTE — Patient Instructions (Signed)
[facility]  Discharge Instructions: Thank you for choosing [facility] to provide your oncology and hematology care.   If you have a lab appointment with the [facility], please go directly to the [facility] and check in at the registration area.   Wear comfortable clothing and clothing appropriate for easy access to any Portacath or PICC line.   We strive to give you quality time with your provider. You may need to reschedule your appointment if you arrive late (15 or more minutes).  Arriving late affects you and other patients whose appointments are after yours.  Also, if you miss three or more appointments without notifying the office, you may be dismissed from the clinic at the provider's discretion.      For prescription refill requests, have your pharmacy contact our office and allow 72 hours for refills to be completed.    Today you received the following chemotherapy and/or immunotherapy agents : Velcade      To help prevent nausea and vomiting after your treatment, we encourage you to take your nausea medication as directed.  BELOW ARE SYMPTOMS THAT SHOULD BE REPORTED IMMEDIATELY: *FEVER GREATER THAN 100.4 F (38 C) OR HIGHER *CHILLS OR SWEATING *NAUSEA AND VOMITING THAT IS NOT CONTROLLED WITH YOUR NAUSEA MEDICATION *UNUSUAL SHORTNESS OF BREATH *UNUSUAL BRUISING OR BLEEDING *URINARY PROBLEMS (pain or burning when urinating, or frequent urination) *BOWEL PROBLEMS (unusual diarrhea, constipation, pain near the anus) TENDERNESS IN MOUTH AND THROAT WITH OR WITHOUT PRESENCE OF ULCERS (sore throat, sores in mouth, or a toothache) UNUSUAL RASH, SWELLING OR PAIN  UNUSUAL VAGINAL DISCHARGE OR ITCHING   Items with * indicate a potential emergency and should be followed up as soon as possible or go to the Emergency Department if any problems should occur.  Please show the CHEMOTHERAPY ALERT CARD or IMMUNOTHERAPY ALERT CARD at  check-in to the Emergency Department and triage nurse.  Should you have questions after your visit or need to cancel or reschedule your appointment, please contact [facility]  Dept: [phone]  and follow the prompts.  Office hours are 8:00 a.m. to 4:30 p.m. Monday - Friday. Please note that voicemails left after 4:00 p.m. may not be returned until the following business day.  We are closed weekends and major holidays. You have access to a nurse at all times for urgent questions. Please call the main number to the clinic Dept: [phone] and follow the prompts.   For any non-urgent questions, you may also contact your provider using MyChart. We now offer e-Visits for anyone 70 and older to request care online for non-urgent symptoms. For details visit [email or web address].   Also download the MyChart app! Go to the app store, search "MyChart", open the app, select [facility], and log in with your MyChart username and password.

## 2022-10-20 NOTE — Progress Notes (Signed)
Per [name], MD okay to treat with Creatinine 1.99

## 2022-10-21 ENCOUNTER — Other Ambulatory Visit: Payer: No Typology Code available for payment source

## 2022-10-21 ENCOUNTER — Ambulatory Visit: Payer: No Typology Code available for payment source

## 2022-10-21 LAB — KAPPA/LAMBDA LIGHT CHAINS
Kappa free light chain: 15.6 mg/L (ref 3.3–19.4)
Kappa, lambda light chain ratio: 1.47 (ref 0.26–1.65)
Lambda free light chains: 10.6 mg/L (ref 5.7–26.3)

## 2022-10-28 LAB — MULTIPLE MYELOMA PANEL, SERUM
Albumin SerPl Elph-Mcnc: 3.8 g/dL (ref 2.9–4.4)
Albumin/Glob SerPl: 1.8 — ABNORMAL HIGH (ref 0.7–1.7)
Alpha 1: 0.2 g/dL (ref 0.0–0.4)
Alpha2 Glob SerPl Elph-Mcnc: 0.7 g/dL (ref 0.4–1.0)
B-Globulin SerPl Elph-Mcnc: 0.9 g/dL (ref 0.7–1.3)
Gamma Glob SerPl Elph-Mcnc: 0.4 g/dL (ref 0.4–1.8)
Globulin, Total: 2.2 g/dL (ref 2.2–3.9)
IgA: 22 mg/dL — ABNORMAL LOW (ref 61–437)
IgG (Immunoglobin G), Serum: 454 mg/dL — ABNORMAL LOW (ref 603–1613)
IgM (Immunoglobulin M), Srm: 23 mg/dL (ref 15–143)
Total Protein ELP: 6 g/dL (ref 6.0–8.5)

## 2022-11-03 ENCOUNTER — Inpatient Hospital Stay: Payer: No Typology Code available for payment source | Admitting: Hematology and Oncology

## 2022-11-03 ENCOUNTER — Inpatient Hospital Stay: Payer: No Typology Code available for payment source

## 2022-11-03 ENCOUNTER — Inpatient Hospital Stay (HOSPITAL_BASED_OUTPATIENT_CLINIC_OR_DEPARTMENT_OTHER): Payer: No Typology Code available for payment source | Admitting: Hematology and Oncology

## 2022-11-03 VITALS — BP 148/80 | HR 58 | Resp 16

## 2022-11-03 VITALS — BP 146/81 | HR 55 | Temp 98.2°F | Resp 16 | Wt 232.5 lb

## 2022-11-03 DIAGNOSIS — C9000 Multiple myeloma not having achieved remission: Secondary | ICD-10-CM | POA: Diagnosis not present

## 2022-11-03 LAB — CBC WITH DIFFERENTIAL (CANCER CENTER ONLY)
Abs Immature Granulocytes: 0.01 10*3/uL (ref 0.00–0.07)
Basophils Absolute: 0 10*3/uL (ref 0.0–0.1)
Basophils Relative: 1 %
Eosinophils Absolute: 0.2 10*3/uL (ref 0.0–0.5)
Eosinophils Relative: 5 %
HCT: 32.6 % — ABNORMAL LOW (ref 39.0–52.0)
Hemoglobin: 10.6 g/dL — ABNORMAL LOW (ref 13.0–17.0)
Immature Granulocytes: 0 %
Lymphocytes Relative: 24 %
Lymphs Abs: 0.9 10*3/uL (ref 0.7–4.0)
MCH: 34.9 pg — ABNORMAL HIGH (ref 26.0–34.0)
MCHC: 32.5 g/dL (ref 30.0–36.0)
MCV: 107.2 fL — ABNORMAL HIGH (ref 80.0–100.0)
Monocytes Absolute: 0.6 10*3/uL (ref 0.1–1.0)
Monocytes Relative: 18 %
Neutro Abs: 1.9 10*3/uL (ref 1.7–7.7)
Neutrophils Relative %: 52 %
Platelet Count: 187 10*3/uL (ref 150–400)
RBC: 3.04 MIL/uL — ABNORMAL LOW (ref 4.22–5.81)
RDW: 14.6 % (ref 11.5–15.5)
WBC Count: 3.6 10*3/uL — ABNORMAL LOW (ref 4.0–10.5)
nRBC: 0 % (ref 0.0–0.2)

## 2022-11-03 LAB — CMP (CANCER CENTER ONLY)
ALT: 15 U/L (ref 0–44)
AST: 20 U/L (ref 15–41)
Albumin: 4.1 g/dL (ref 3.5–5.0)
Alkaline Phosphatase: 53 U/L (ref 38–126)
Anion gap: 5 (ref 5–15)
BUN: 14 mg/dL (ref 8–23)
CO2: 28 mmol/L (ref 22–32)
Calcium: 8.5 mg/dL — ABNORMAL LOW (ref 8.9–10.3)
Chloride: 108 mmol/L (ref 98–111)
Creatinine: 1.77 mg/dL — ABNORMAL HIGH (ref 0.61–1.24)
GFR, Estimated: 39 mL/min — ABNORMAL LOW (ref >60)
Glucose, Bld: 85 mg/dL (ref 70–99)
Potassium: 4.1 mmol/L (ref 3.5–5.1)
Sodium: 141 mmol/L (ref 135–145)
Total Bilirubin: 0.5 mg/dL (ref 0.3–1.2)
Total Protein: 6.2 g/dL — ABNORMAL LOW (ref 6.5–8.1)

## 2022-11-03 LAB — LACTATE DEHYDROGENASE: LDH: 228 U/L — ABNORMAL HIGH (ref 98–192)

## 2022-11-03 MED ORDER — BORTEZOMIB CHEMO SQ INJECTION 3.5 MG (2.5MG/ML)
1.3000 mg/m2 | Freq: Once | INTRAMUSCULAR | Status: AC
  Administered 2022-11-03: 3 mg via SUBCUTANEOUS
  Filled 2022-11-03: qty 1.2

## 2022-11-03 MED ORDER — PROCHLORPERAZINE MALEATE 10 MG PO TABS
10.0000 mg | ORAL_TABLET | Freq: Once | ORAL | Status: AC
  Administered 2022-11-03: 10 mg via ORAL
  Filled 2022-11-03: qty 1

## 2022-11-03 NOTE — Progress Notes (Signed)
[place] [phone]   [phone]  PROGRESS NOTE  Patient Care Team: [name], MD as PCP - General (Internal Medicine)  Hematological/Oncological History # IgA Lambda Multiple Myeloma 05/03/2015: last visit with Dr. Julien Nordmann at the [facility]. Was followed for IgA lambda MGUS. 12/11/2021: labs show M protein 3.4, Kappa 19.2, Lambda 1576.4, ratio 0.01. Cr 3.47, Hgb 8.6, WBC 5.7, MCV 99, Plt 221 12/23/2021: establish care with Dr. [name]  01/16/2022: Bone marrow biopsy performed, showed a 60% cellular bone marrow predominantly comprised of plasma cells making up 70 to 80%, lambda restricted.  Myeloma FISH panel showed no evidence of abnormalities. 02/07/2022: Cycle 1 Day 1 of CyBorD chemotherapy.  03/09/2022-03/17/2022: hospitalized for fever/pneumonia.  03/24/2022: Cycle 2 Day 1 of CyBorD chemotherapy.  04/22/2022: Cycle 3 Day 1 of CyBorD chemotherapy 05/19/2022-05/26/2022: Delayed start of Cycle 4 Day 1 of CyBorD chemotherapy due to neutropenia.  06/03/2022: Cycle 4 Day 1 of CyBorD chemotherapy. 25% dose reduction of cyclophosphamide due to cytopenias. 06/30/2022: Cycle 4 Day 22. Drop Velcade dose to '1mg'$ /m2 and Cyclophosphamide 200 mg/m2  07/07/2022: Cycle 5 Day 1 of CyBorD chemotherapy with dose reductions.  08/04/2022: Cycle 6 Day 1 of CyBorD chemotherapy with dose reductions.  09/09/2022: transition to maintenance dose Velcade q 2 weeks.   Interval History:  Darren Hinton. 77 y.o. male with medical history significant for IgA lambda multiple myeloma who presents for a follow up visit. The patient's last visit was on 10/07/2022. In the interim since the last visit he has continued on maintenance Velcade.   On exam today Darren Hinton reports he has been tolerating the every 2-week Velcade quite well.  He reports that he is not having any side effects as result of the medication other than the neuropathy.  He reports no nausea, vomiting, or diarrhea.   He notes that he is wearing compression stockings and is improving the swelling of his legs.  He notes that he does still have the neuropathy of his last 2 digits on both hands bilaterally as well as some pain in his legs during the day.  He has that his balance is "a little off".  He notes that he is working with home health PT and it is "all good".  He notes that otherwise he has no questions concerns or complaints.  He is willing and able to proceed with treatment at this time.  He denies fevers, chills, sweats, shortness of breath, chest pain or cough. He has no other complaints. Rest of the 10 point ROS is below.   MEDICAL HISTORY:  Past Medical History:  Diagnosis Date   Allergy    Anxiety    Arthritis    Depression    Heart murmur    Hyperlipidemia    Hyperplastic colon polyp    Hyperproteinemia    Hypertension    Hypothyroidism    Mitral valve prolapse    Paroxysmal atrial fibrillation (HCC)    Thyroid disease     SURGICAL HISTORY: Past Surgical History:  Procedure Laterality Date   CARDIOVERSION N/A 03/14/2022   Procedure: CARDIOVERSION;  Surgeon: Pixie Casino, MD;  Location: [place];  Service: Cardiovascular;  Laterality: N/A;   TEE WITHOUT CARDIOVERSION N/A 03/14/2022   Procedure: TRANSESOPHAGEAL ECHOCARDIOGRAM (TEE);  Surgeon: Pixie Casino, MD;  Location: [facility] ENDOSCOPY;  Service: Cardiovascular;  Laterality: N/A;    SOCIAL HISTORY: Social History   Socioeconomic History   Marital status: Married    Spouse name:  Not on file   Number of children: Not on file   Years of education: Not on file   Highest education level: Not on file  Occupational History   Not on file  Tobacco Use   Smoking status: Former    Years: 20.00    Types: Cigarettes    Quit date: 10/01/1978    Years since quitting: 44.1   Smokeless tobacco: Never   Tobacco comments:    Former smoker 04/03/22  Substance and Sexual Activity   Alcohol use: Yes    Alcohol/week: 2.0 standard drinks  of alcohol    Types: 2 Glasses of wine per week    Comment: occasionally   Drug use: No   Sexual activity: Not on file  Other Topics Concern   Not on file  Social History Narrative   Not on file   Social Determinants of Health   Financial Resource Strain: Not on file  Food Insecurity: Not on file  Transportation Needs: Not on file  Physical Activity: Not on file  Stress: Not on file  Social Connections: Not on file  Intimate Partner Violence: Not on file    FAMILY HISTORY: Family History  Problem Relation Age of Onset   Cancer Father    Colon cancer Neg Hx    Esophageal cancer Neg Hx    Liver cancer Neg Hx    Pancreatic cancer Neg Hx    Rectal cancer Neg Hx    Stomach cancer Neg Hx     ALLERGIES:  is allergic to zithromax [azithromycin].  MEDICATIONS:  Current Outpatient Medications  Medication Sig Dispense Refill   acyclovir (ZOVIRAX) 400 MG tablet TAKE 1 TABLET BY MOUTH TWICE A DAY 180 tablet 1   ALPRAZolam (XANAX) 0.25 MG tablet Take 0.25 mg by mouth 2 (two) times daily as needed for anxiety. (Patient not taking: Reported on 09/10/2022)     amiodarone (PACERONE) 200 MG tablet TAKE 1 TABLET BY MOUTH EVERY DAY 90 tablet 3   amLODipine (NORVASC) 5 MG tablet TAKE 1 TABLET (5 MG TOTAL) BY MOUTH DAILY. 90 tablet 3   apixaban (ELIQUIS) 5 MG TABS tablet Take 1 tablet (5 mg total) by mouth 2 (two) times daily. 180 tablet 1   atorvastatin (LIPITOR) 20 MG tablet Take 20 mg by mouth daily in the afternoon.     dexamethasone (DECADRON) 4 MG tablet TAKE 10 TABLETS (40 MG TOTAL) BY MOUTH ONCE A WEEK IN THE MORNING ON CHEMOTHERAPY DAYS 120 tablet 1   ergocalciferol (VITAMIN D2) 1.25 MG (50000 UT) capsule Take 50,000 Units by mouth every Wednesday.     ferrous gluconate (FERGON) 324 MG tablet Take 1 tablet (324 mg total) by mouth 3 (three) times daily with meals. 90 tablet 0   fluticasone (FLONASE) 50 MCG/ACT nasal spray Place 2 sprays into the nose daily as needed for allergies.      gabapentin (NEURONTIN) 300 MG capsule Take 1 capsule (300 mg total) by mouth 2 (two) times daily. 60 capsule 1   levothyroxine (SYNTHROID) 100 MCG tablet Take 100 mcg by mouth See admin instructions. Take 1 tablet (100 mcg) by mouth on Mondays, Tuesdays, Wednesdays, Thursdays, Fridays & Saturdays in the morning. Skip a dose on Sundays.     loratadine (CLARITIN) 10 MG tablet Take 10 mg by mouth daily as needed for allergies.     meclizine (ANTIVERT) 25 MG tablet Take 1 tablet (25 mg total) by mouth 3 (three) times daily as needed for dizziness. 30 tablet 0  metoprolol succinate (TOPROL-XL) 25 MG 24 hr tablet Take 1 tablet (25 mg total) by mouth daily. Take with or immediately following a meal. 30 tablet 3   prochlorperazine (COMPAZINE) 10 MG tablet Take 1 tablet (10 mg total) by mouth every 6 (six) hours as needed for nausea or vomiting. 30 tablet 0   senna-docusate (SENOKOT-S) 8.6-50 MG tablet Take 1 tablet by mouth at bedtime as needed for mild constipation.     traMADol (ULTRAM) 50 MG tablet Take 1 tablet (50 mg total) by mouth every 6 (six) hours as needed. 30 tablet 0   vitamin B-12 (CYANOCOBALAMIN) 500 MCG tablet Take 500 mcg by mouth in the morning.     Current Facility-Administered Medications  Medication Dose Route Frequency Provider Last Rate Last Admin   0.9 %  sodium chloride infusion  500 mL Intravenous Once Armbruster, Carlota Raspberry, MD       Facility-Administered Medications Ordered in Other Visits  Medication Dose Route Frequency Provider Last Rate Last Admin   bortezomib SQ (VELCADE) chemo injection (2.'5mg'$ /mL concentration) 3 mg  1.3 mg/m2 (Treatment Plan Recorded) Subcutaneous Once Orson Slick, MD       prochlorperazine (COMPAZINE) tablet 10 mg  10 mg Oral Once Orson Slick, MD        REVIEW OF SYSTEMS:   Constitutional: ( - ) fevers, ( - )  chills , ( - ) night sweats Eyes: ( - ) blurriness of vision, ( - ) double vision, ( - ) watery eyes Ears, nose, mouth, throat,  and face: ( - ) mucositis, ( - ) sore throat Respiratory: ( - ) cough, ( - ) dyspnea, ( - ) wheezes Cardiovascular: ( - ) palpitation, ( - ) chest discomfort, (+ ) lower extremity swelling Gastrointestinal:  ( - ) nausea, ( - ) heartburn, ( - ) change in bowel habits Skin: ( - ) abnormal skin rashes Lymphatics: ( - ) new lymphadenopathy, ( - ) easy bruising Neurological: ( +- ) numbness, ( - ) tingling, ( - ) new weaknesses Behavioral/Psych: ( - ) mood change, ( - ) new changes  All other systems were reviewed with the patient and are negative.  PHYSICAL EXAMINATION: ECOG PERFORMANCE STATUS: 1 - Symptomatic but completely ambulatory  Vitals:   11/03/22 0832  BP: (!) 146/81  Pulse: (!) 55  Resp: 16  Temp: 98.2 F (36.8 C)  SpO2: 99%    Filed Weights   11/03/22 0832  Weight: 232 lb 8 oz (105.5 kg)    GENERAL: Chronically ill-appearing elderly African-American male, alert, no distress and comfortable SKIN: skin color, texture, turgor are normal, no rashes or significant lesions EYES: conjunctiva are pink and non-injected, sclera clear LUNGS: clear to auscultation and percussion with normal breathing effort HEART: regular rate & rhythm and no murmurs and + 2 bilateral ankle edema Musculoskeletal: no cyanosis of digits and no clubbing  PSYCH: alert & oriented x 3, fluent speech NEURO: no focal motor/sensory deficits  LABORATORY DATA:  I have reviewed the data as listed    Latest Ref Rng & Units 11/03/2022    7:53 AM 10/20/2022    7:56 AM 10/07/2022    1:01 PM  CBC  WBC 4.0 - 10.5 K/uL 3.6  4.2  5.8   Hemoglobin 13.0 - 17.0 g/dL 10.6  11.5  11.0   Hematocrit 39.0 - 52.0 % 32.6  34.9  33.3   Platelets 150 - 400 K/uL 187  201  230  Latest Ref Rng & Units 11/03/2022    7:53 AM 10/20/2022    7:56 AM 10/07/2022    1:01 PM  CMP  Glucose 70 - 99 mg/dL 85  85  108   BUN 8 - 23 mg/dL '14  15  14   '$ Creatinine 0.61 - 1.24 mg/dL 1.77  1.99  1.88   Sodium 135 - 145 mmol/L 141   140  140   Potassium 3.5 - 5.1 mmol/L 4.1  4.4  3.9   Chloride 98 - 111 mmol/L 108  108  107   CO2 22 - 32 mmol/L '28  26  27   '$ Calcium 8.9 - 10.3 mg/dL 8.5  9.2  9.5   Total Protein 6.5 - 8.1 g/dL 6.2  6.4  6.7   Total Bilirubin 0.3 - 1.2 mg/dL 0.5  0.7  0.6   Alkaline Phos 38 - 126 U/L 53  58  64   AST 15 - 41 U/L '20  24  26   '$ ALT 0 - 44 U/L '15  15  16     '$ Lab Results  Component Value Date   MPROTEIN Not Observed 10/20/2022   MPROTEIN Not Observed 10/07/2022   MPROTEIN Not Observed 09/22/2022   Lab Results  Component Value Date   KPAFRELGTCHN 15.6 10/20/2022   KPAFRELGTCHN 17.4 10/07/2022   KPAFRELGTCHN 14.3 09/22/2022   LAMBDASER 10.6 10/20/2022   LAMBDASER 10.1 10/07/2022   LAMBDASER 9.3 09/22/2022   KAPLAMBRATIO 1.47 10/20/2022   KAPLAMBRATIO 1.72 (H) 10/07/2022   KAPLAMBRATIO 1.54 09/22/2022    RADIOGRAPHIC STUDIES: No results found.  ASSESSMENT & PLAN Darren Hinton. Is a 77 y.o. male with medical history significant for IgA lambda multiple myeloma who presents for a follow up visit. .    # IgA lambda Multiple Myeloma -- Bone marrow biopsy performed on 01/16/2022 showed increased plasma cells consistent with a plasma cell neoplasm.  Normal FISH panel. --Patient meets diagnostic criteria based on kidney dysfunction and anemia. --Cycle 1 Day 1 of CyBorD chemotherapy started on 02/07/2022 --Velcade maintenance therapy started on 09/09/2022.  Plan: --Most recent SPEP from 10/20/2022 showed M protein 0.0 g/dL, kappa 15.6, lambda 10.6,ratio 1.47. Labs repeated today.  --Labs today show white blood cell 3.6, hemoglobin 10.6, MCV 107.2, and platelets of 187 --continue on maintenance Velcade every 2 weeks with monthly clinic visits.  # Leg Pain -- Likely a component of worsening neuropathy from his chemotherapy with pre-existing pain. -- continue gabapentin to 300 mg twice daily -- continue tramadol 50 mg every 6 hours as needed -- sent referral to Dr. Mickeal Skinner for further  recommendations.   #Supportive Care -- chemotherapy education complete -- port placement not required.  -- zofran '8mg'$  q8H PRN and compazine '10mg'$  PO q6H for nausea -- acyclovir '400mg'$  PO BID for VCZ prophylaxis --zometa to start after dental clearance.   Orders Placed This Encounter  Procedures   CBC with Differential ([place] Only)    Standing Status:   Future    Standing Expiration Date:   11/04/2023   CMP ([place] only)    Standing Status:   Future    Standing Expiration Date:   11/04/2023   Multiple Myeloma Panel (SPEP&IFE w/QIG)    Standing Status:   Future    Standing Expiration Date:   11/17/2023   Kappa/lambda light chains    Standing Status:   Future    Standing Expiration Date:   11/17/2023   CBC with Differential ([place]  Only)    Standing Status:   Future    Standing Expiration Date:   11/18/2023   CMP ([place] only)    Standing Status:   Future    Standing Expiration Date:   11/18/2023   CBC with Differential ([place] Only)    Standing Status:   Future    Standing Expiration Date:   12/02/2023   CMP ([place] only)    Standing Status:   Future    Standing Expiration Date:   12/02/2023   All questions were answered. The patient knows to call the clinic with any problems, questions or concerns.  I have spent a total of 30 minutes minutes of face-to-face and non-face-to-face time, preparing to see the patient, performing a medically appropriate examination, counseling and educating the patient, documenting clinical information in the electronic health record,  and care coordination.   Ledell Peoples, MD Department of Hematology/Oncology [place] at [facility] Phone: [phone] Pager: [phone] Email: [email or web address]    11/03/2022 8:47 AM

## 2022-11-03 NOTE — Patient Instructions (Signed)
[place]  Discharge Instructions: Thank you for choosing [facility] to provide your oncology and hematology care.   If you have a lab appointment with the [facility], please go directly to the [facility] and check in at the registration area.   Wear comfortable clothing and clothing appropriate for easy access to any Portacath or PICC line.   We strive to give you quality time with your provider. You may need to reschedule your appointment if you arrive late (15 or more minutes).  Arriving late affects you and other patients whose appointments are after yours.  Also, if you miss three or more appointments without notifying the office, you may be dismissed from the clinic at the provider's discretion.      For prescription refill requests, have your pharmacy contact our office and allow 72 hours for refills to be completed.    Today you received the following chemotherapy and/or immunotherapy agents : Velcade      To help prevent nausea and vomiting after your treatment, we encourage you to take your nausea medication as directed.  BELOW ARE SYMPTOMS THAT SHOULD BE REPORTED IMMEDIATELY: *FEVER GREATER THAN 100.4 F (38 C) OR HIGHER *CHILLS OR SWEATING *NAUSEA AND VOMITING THAT IS NOT CONTROLLED WITH YOUR NAUSEA MEDICATION *UNUSUAL SHORTNESS OF BREATH *UNUSUAL BRUISING OR BLEEDING *URINARY PROBLEMS (pain or burning when urinating, or frequent urination) *BOWEL PROBLEMS (unusual diarrhea, constipation, pain near the anus) TENDERNESS IN MOUTH AND THROAT WITH OR WITHOUT PRESENCE OF ULCERS (sore throat, sores in mouth, or a toothache) UNUSUAL RASH, SWELLING OR PAIN  UNUSUAL VAGINAL DISCHARGE OR ITCHING   Items with * indicate a potential emergency and should be followed up as soon as possible or go to the Emergency Department if any problems should occur.  Please show the CHEMOTHERAPY ALERT CARD or IMMUNOTHERAPY ALERT CARD at  check-in to the Emergency Department and triage nurse.  Should you have questions after your visit or need to cancel or reschedule your appointment, please contact [facility]  Dept: [phone]  and follow the prompts.  Office hours are 8:00 a.m. to 4:30 p.m. Monday - Friday. Please note that voicemails left after 4:00 p.m. may not be returned until the following business day.  We are closed weekends and major holidays. You have access to a nurse at all times for urgent questions. Please call the main number to the clinic Dept: [phone] and follow the prompts.   For any non-urgent questions, you may also contact your provider using MyChart. We now offer e-Visits for anyone 51 and older to request care online for non-urgent symptoms. For details visit [email or web address].   Also download the MyChart app! Go to the app store, search "MyChart", open the app, select [facility], and log in with your MyChart username and password.

## 2022-11-03 NOTE — Progress Notes (Signed)
Per Dr. [name] ok to treat with creatinine of 1.77 today.

## 2022-11-04 LAB — KAPPA/LAMBDA LIGHT CHAINS
Kappa free light chain: 14.2 mg/L (ref 3.3–19.4)
Kappa, lambda light chain ratio: 1.46 (ref 0.26–1.65)
Lambda free light chains: 9.7 mg/L (ref 5.7–26.3)

## 2022-11-10 LAB — MULTIPLE MYELOMA PANEL, SERUM
Albumin SerPl Elph-Mcnc: 3.8 g/dL (ref 2.9–4.4)
Albumin/Glob SerPl: 2.1 — ABNORMAL HIGH (ref 0.7–1.7)
Alpha 1: 0.2 g/dL (ref 0.0–0.4)
Alpha2 Glob SerPl Elph-Mcnc: 0.6 g/dL (ref 0.4–1.0)
B-Globulin SerPl Elph-Mcnc: 0.7 g/dL (ref 0.7–1.3)
Gamma Glob SerPl Elph-Mcnc: 0.4 g/dL (ref 0.4–1.8)
Globulin, Total: 1.9 g/dL — ABNORMAL LOW (ref 2.2–3.9)
IgA: 22 mg/dL — ABNORMAL LOW (ref 61–437)
IgG (Immunoglobin G), Serum: 443 mg/dL — ABNORMAL LOW (ref 603–1613)
IgM (Immunoglobulin M), Srm: 21 mg/dL (ref 15–143)
Total Protein ELP: 5.7 g/dL — ABNORMAL LOW (ref 6.0–8.5)

## 2022-11-17 ENCOUNTER — Inpatient Hospital Stay: Payer: No Typology Code available for payment source | Attending: Hematology and Oncology

## 2022-11-17 ENCOUNTER — Inpatient Hospital Stay: Payer: No Typology Code available for payment source

## 2022-11-17 ENCOUNTER — Ambulatory Visit: Payer: No Typology Code available for payment source | Admitting: Physician Assistant

## 2022-11-17 ENCOUNTER — Other Ambulatory Visit: Payer: No Typology Code available for payment source

## 2022-11-17 VITALS — BP 142/78 | HR 51 | Temp 98.3°F | Resp 18 | Wt 234.5 lb

## 2022-11-17 DIAGNOSIS — C9000 Multiple myeloma not having achieved remission: Secondary | ICD-10-CM | POA: Insufficient documentation

## 2022-11-17 DIAGNOSIS — Z5111 Encounter for antineoplastic chemotherapy: Secondary | ICD-10-CM | POA: Diagnosis present

## 2022-11-17 DIAGNOSIS — I4891 Unspecified atrial fibrillation: Secondary | ICD-10-CM | POA: Diagnosis not present

## 2022-11-17 DIAGNOSIS — Z5112 Encounter for antineoplastic immunotherapy: Secondary | ICD-10-CM | POA: Insufficient documentation

## 2022-11-17 DIAGNOSIS — Z8719 Personal history of other diseases of the digestive system: Secondary | ICD-10-CM | POA: Insufficient documentation

## 2022-11-17 DIAGNOSIS — Z881 Allergy status to other antibiotic agents status: Secondary | ICD-10-CM | POA: Insufficient documentation

## 2022-11-17 DIAGNOSIS — Z809 Family history of malignant neoplasm, unspecified: Secondary | ICD-10-CM | POA: Insufficient documentation

## 2022-11-17 DIAGNOSIS — Z87891 Personal history of nicotine dependence: Secondary | ICD-10-CM | POA: Diagnosis not present

## 2022-11-17 DIAGNOSIS — Z7901 Long term (current) use of anticoagulants: Secondary | ICD-10-CM | POA: Diagnosis not present

## 2022-11-17 DIAGNOSIS — Z79899 Other long term (current) drug therapy: Secondary | ICD-10-CM | POA: Insufficient documentation

## 2022-11-17 LAB — CBC WITH DIFFERENTIAL (CANCER CENTER ONLY)
Abs Immature Granulocytes: 0.03 10*3/uL (ref 0.00–0.07)
Basophils Absolute: 0 10*3/uL (ref 0.0–0.1)
Basophils Relative: 1 %
Eosinophils Absolute: 0.2 10*3/uL (ref 0.0–0.5)
Eosinophils Relative: 4 %
HCT: 35.5 % — ABNORMAL LOW (ref 39.0–52.0)
Hemoglobin: 12 g/dL — ABNORMAL LOW (ref 13.0–17.0)
Immature Granulocytes: 1 %
Lymphocytes Relative: 16 %
Lymphs Abs: 1 10*3/uL (ref 0.7–4.0)
MCH: 35.1 pg — ABNORMAL HIGH (ref 26.0–34.0)
MCHC: 33.8 g/dL (ref 30.0–36.0)
MCV: 103.8 fL — ABNORMAL HIGH (ref 80.0–100.0)
Monocytes Absolute: 0.8 10*3/uL (ref 0.1–1.0)
Monocytes Relative: 13 %
Neutro Abs: 4.2 10*3/uL (ref 1.7–7.7)
Neutrophils Relative %: 65 %
Platelet Count: 279 10*3/uL (ref 150–400)
RBC: 3.42 MIL/uL — ABNORMAL LOW (ref 4.22–5.81)
RDW: 14.2 % (ref 11.5–15.5)
WBC Count: 6.3 10*3/uL (ref 4.0–10.5)
nRBC: 0 % (ref 0.0–0.2)

## 2022-11-17 LAB — CMP (CANCER CENTER ONLY)
ALT: 15 U/L (ref 0–44)
AST: 21 U/L (ref 15–41)
Albumin: 4.3 g/dL (ref 3.5–5.0)
Alkaline Phosphatase: 54 U/L (ref 38–126)
Anion gap: 7 (ref 5–15)
BUN: 16 mg/dL (ref 8–23)
CO2: 26 mmol/L (ref 22–32)
Calcium: 9.5 mg/dL (ref 8.9–10.3)
Chloride: 108 mmol/L (ref 98–111)
Creatinine: 2.07 mg/dL — ABNORMAL HIGH (ref 0.61–1.24)
GFR, Estimated: 32 mL/min — ABNORMAL LOW (ref >60)
Glucose, Bld: 94 mg/dL (ref 70–99)
Potassium: 4 mmol/L (ref 3.5–5.1)
Sodium: 141 mmol/L (ref 135–145)
Total Bilirubin: 0.6 mg/dL (ref 0.3–1.2)
Total Protein: 6.8 g/dL (ref 6.5–8.1)

## 2022-11-17 MED ORDER — PROCHLORPERAZINE MALEATE 10 MG PO TABS
10.0000 mg | ORAL_TABLET | Freq: Once | ORAL | Status: AC
  Administered 2022-11-17: 10 mg via ORAL
  Filled 2022-11-17: qty 1

## 2022-11-17 MED ORDER — BORTEZOMIB CHEMO SQ INJECTION 3.5 MG (2.5MG/ML)
1.3000 mg/m2 | Freq: Once | INTRAMUSCULAR | Status: AC
  Administered 2022-11-17: 3 mg via SUBCUTANEOUS
  Filled 2022-11-17: qty 1.2

## 2022-11-17 MED ORDER — SODIUM CHLORIDE 0.9 % IV SOLN: Freq: Once | INTRAVENOUS | Status: AC

## 2022-11-17 NOTE — Patient Instructions (Signed)
[facility]   Discharge Instructions: Thank you for choosing [facility] to provide your oncology and hematology care.   If you have a lab appointment with the Cancer Center, please go directly to the Cancer Center and check in at the registration area.   Wear comfortable clothing and clothing appropriate for easy access to any Portacath or PICC line.   We strive to give you quality time with your provider. You may need to reschedule your appointment if you arrive late (15 or more minutes).  Arriving late affects you and other patients whose appointments are after yours.  Also, if you miss three or more appointments without notifying the office, you may be dismissed from the clinic at the provider's discretion.      For prescription refill requests, have your pharmacy contact our office and allow 72 hours for refills to be completed.    Today you received the following chemotherapy and/or immunotherapy agents: Bortezomib (Velcade)      To help prevent nausea and vomiting after your treatment, we encourage you to take your nausea medication as directed.  BELOW ARE SYMPTOMS THAT SHOULD BE REPORTED IMMEDIATELY: *FEVER GREATER THAN 100.4 F (38 C) OR HIGHER *CHILLS OR SWEATING *NAUSEA AND VOMITING THAT IS NOT CONTROLLED WITH YOUR NAUSEA MEDICATION *UNUSUAL SHORTNESS OF BREATH *UNUSUAL BRUISING OR BLEEDING *URINARY PROBLEMS (pain or burning when urinating, or frequent urination) *BOWEL PROBLEMS (unusual diarrhea, constipation, pain near the anus) TENDERNESS IN MOUTH AND THROAT WITH OR WITHOUT PRESENCE OF ULCERS (sore throat, sores in mouth, or a toothache) UNUSUAL RASH, SWELLING OR PAIN  UNUSUAL VAGINAL DISCHARGE OR ITCHING   Items with * indicate a potential emergency and should be followed up as soon as possible or go to the Emergency Department if any problems should occur.  Please show the CHEMOTHERAPY ALERT CARD or IMMUNOTHERAPY ALERT  CARD at check-in to the Emergency Department and triage nurse.  Should you have questions after your visit or need to cancel or reschedule your appointment, please contact [facility]  Dept: [phone]  and follow the prompts.  Office hours are 8:00 a.m. to 4:30 p.m. Monday - Friday. Please note that voicemails left after 4:00 p.m. may not be returned until the following business day.  We are closed weekends and major holidays. You have access to a nurse at all times for urgent questions. Please call the main number to the clinic Dept: [phone] and follow the prompts.   For any non-urgent questions, you may also contact your provider using MyChart. We now offer e-Visits for anyone 18 and older to request care online for non-urgent symptoms. For details visit [email or web address].   Also download the MyChart app! Go to the app store, search "MyChart", open the app, select [facility], and log in with your MyChart username and password.   

## 2022-11-17 NOTE — Progress Notes (Signed)
Per Dede Query, PA-C - okay to proceed with treatment with creatinine of 2.07.   Patient to receive 500 ml of IVF.

## 2022-11-18 LAB — KAPPA/LAMBDA LIGHT CHAINS
Kappa free light chain: 21.6 mg/L — ABNORMAL HIGH (ref 3.3–19.4)
Kappa, lambda light chain ratio: 1.59 (ref 0.26–1.65)
Lambda free light chains: 13.6 mg/L (ref 5.7–26.3)

## 2022-11-19 ENCOUNTER — Ambulatory Visit: Payer: No Typology Code available for payment source | Admitting: Physician Assistant

## 2022-11-19 ENCOUNTER — Other Ambulatory Visit: Payer: No Typology Code available for payment source

## 2022-11-19 ENCOUNTER — Ambulatory Visit: Payer: No Typology Code available for payment source

## 2022-11-21 ENCOUNTER — Other Ambulatory Visit: Payer: Self-pay | Admitting: Internal Medicine

## 2022-11-21 DIAGNOSIS — G620 Drug-induced polyneuropathy: Secondary | ICD-10-CM

## 2022-11-21 LAB — MULTIPLE MYELOMA PANEL, SERUM
Albumin SerPl Elph-Mcnc: 3.8 g/dL (ref 2.9–4.4)
Albumin/Glob SerPl: 1.6 (ref 0.7–1.7)
Alpha 1: 0.3 g/dL (ref 0.0–0.4)
Alpha2 Glob SerPl Elph-Mcnc: 0.7 g/dL (ref 0.4–1.0)
B-Globulin SerPl Elph-Mcnc: 0.9 g/dL (ref 0.7–1.3)
Gamma Glob SerPl Elph-Mcnc: 0.5 g/dL (ref 0.4–1.8)
Globulin, Total: 2.4 g/dL (ref 2.2–3.9)
IgA: 29 mg/dL — ABNORMAL LOW (ref 61–437)
IgG (Immunoglobin G), Serum: 519 mg/dL — ABNORMAL LOW (ref 603–1613)
IgM (Immunoglobulin M), Srm: 24 mg/dL (ref 15–143)
Total Protein ELP: 6.2 g/dL (ref 6.0–8.5)

## 2022-11-25 ENCOUNTER — Telehealth: Payer: Self-pay | Admitting: Hematology and Oncology

## 2022-11-25 NOTE — Telephone Encounter (Signed)
Called patient back per 3/19 call to reschedule 3/25 appointments to 3/27 due to patient being out of town. MD confirmed/approved. Patient rescheduled and notified.

## 2022-11-30 ENCOUNTER — Other Ambulatory Visit: Payer: No Typology Code available for payment source

## 2022-12-01 ENCOUNTER — Inpatient Hospital Stay: Payer: No Typology Code available for payment source | Admitting: Hematology and Oncology

## 2022-12-01 ENCOUNTER — Inpatient Hospital Stay: Payer: No Typology Code available for payment source

## 2022-12-02 NOTE — Progress Notes (Unsigned)
[place] [phone]   [phone]  PROGRESS NOTE  Patient Care Team: [name], MD as PCP - General (Internal Medicine)  Hematological/Oncological History # IgA Lambda Multiple Myeloma 05/03/2015: last visit with Dr. Julien Nordmann at the [facility]. Was followed for IgA lambda MGUS. 12/11/2021: labs show M protein 3.4, Kappa 19.2, Lambda 1576.4, ratio 0.01. Cr 3.47, Hgb 8.6, WBC 5.7, MCV 99, Plt 221 12/23/2021: establish care with Dr. [name]  01/16/2022: Bone marrow biopsy performed, showed a 60% cellular bone marrow predominantly comprised of plasma cells making up 70 to 80%, lambda restricted.  Myeloma FISH panel showed no evidence of abnormalities. 02/07/2022: Cycle 1 Day 1 of CyBorD chemotherapy.  03/09/2022-03/17/2022: hospitalized for fever/pneumonia.  03/24/2022: Cycle 2 Day 1 of CyBorD chemotherapy.  04/22/2022: Cycle 3 Day 1 of CyBorD chemotherapy 05/19/2022-05/26/2022: Delayed start of Cycle 4 Day 1 of CyBorD chemotherapy due to neutropenia.  06/03/2022: Cycle 4 Day 1 of CyBorD chemotherapy. 25% dose reduction of cyclophosphamide due to cytopenias. 06/30/2022: Cycle 4 Day 22. Drop Velcade dose to 1mg /m2 and Cyclophosphamide 200 mg/m2  07/07/2022: Cycle 5 Day 1 of CyBorD chemotherapy with dose reductions.  08/04/2022: Cycle 6 Day 1 of CyBorD chemotherapy with dose reductions.  09/09/2022: transition to maintenance dose Velcade q 2 weeks.   Interval History:  Mithcell Hinton. 77 y.o. male with medical history significant for IgA lambda multiple myeloma who presents for a follow up visit. The patient's last visit was on 11/03/2022. In the interim since the last visit he has continued on maintenance Velcade.   On exam today [name] reports he has been well overall interim since his last visit.  He reports he has stumbled some but he had no frank falls.  He reports he is having congestion and a cold after he received a flu shot.  He reports that he  is coughing up some yellowish mucus and this has been going on for several weeks.  He notes that overnight he does sometimes have to sit up and coughs up sputum.  He does have some runny nose but no sore throat.  He reports that otherwise he is tolerating his Velcade shots well.  He has a little bit of numbness in his fingers and toes.  He also has physical therapy coming to his home to work with him on his strength.  Overall he is willing and able to continue maintenance Velcade therapy at this time.  He is willing and able to proceed with treatment at this time.  He denies fevers, chills, sweats, shortness of breath, chest pain or cough. He has no other complaints. Rest of the 10 point ROS is below.   MEDICAL HISTORY:  Past Medical History:  Diagnosis Date   Allergy    Anxiety    Arthritis    Depression    Heart murmur    Hyperlipidemia    Hyperplastic colon polyp    Hyperproteinemia    Hypertension    Hypothyroidism    Mitral valve prolapse    Paroxysmal atrial fibrillation (HCC)    Thyroid disease     SURGICAL HISTORY: Past Surgical History:  Procedure Laterality Date   CARDIOVERSION N/A 03/14/2022   Procedure: CARDIOVERSION;  Surgeon: Pixie Casino, MD;  Location: [facility];  Service: Cardiovascular;  Laterality: N/A;   TEE WITHOUT CARDIOVERSION N/A 03/14/2022   Procedure: TRANSESOPHAGEAL ECHOCARDIOGRAM (TEE);  Surgeon: Pixie Casino, MD;  Location: [facility];  Service: Cardiovascular;  Laterality: N/A;  SOCIAL HISTORY: Social History   Socioeconomic History   Marital status: Married    Spouse name: Not on file   Number of children: Not on file   Years of education: Not on file   Highest education level: Not on file  Occupational History   Not on file  Tobacco Use   Smoking status: Former    Years: 20    Types: Cigarettes    Quit date: 10/01/1978    Years since quitting: 44.2   Smokeless tobacco: Never   Tobacco comments:    Former smoker 04/03/22   Substance and Sexual Activity   Alcohol use: Yes    Alcohol/week: 2.0 standard drinks of alcohol    Types: 2 Glasses of wine per week    Comment: occasionally   Drug use: No   Sexual activity: Not on file  Other Topics Concern   Not on file  Social History Narrative   Not on file   Social Determinants of Health   Financial Resource Strain: Not on file  Food Insecurity: Not on file  Transportation Needs: Not on file  Physical Activity: Not on file  Stress: Not on file  Social Connections: Not on file  Intimate Partner Violence: Not on file    FAMILY HISTORY: Family History  Problem Relation Age of Onset   Cancer Father    Colon cancer Neg Hx    Esophageal cancer Neg Hx    Liver cancer Neg Hx    Pancreatic cancer Neg Hx    Rectal cancer Neg Hx    Stomach cancer Neg Hx     ALLERGIES:  is allergic to zithromax [azithromycin].  MEDICATIONS:  Current Outpatient Medications  Medication Sig Dispense Refill   acyclovir (ZOVIRAX) 400 MG tablet TAKE 1 TABLET BY MOUTH TWICE A DAY 180 tablet 1   amiodarone (PACERONE) 200 MG tablet TAKE 1 TABLET BY MOUTH EVERY DAY 90 tablet 3   amLODipine (NORVASC) 5 MG tablet TAKE 1 TABLET (5 MG TOTAL) BY MOUTH DAILY. 90 tablet 3   apixaban (ELIQUIS) 5 MG TABS tablet Take 1 tablet (5 mg total) by mouth 2 (two) times daily. 180 tablet 1   atorvastatin (LIPITOR) 20 MG tablet Take 20 mg by mouth daily in the afternoon.     dexamethasone (DECADRON) 4 MG tablet TAKE 10 TABLETS (40 MG TOTAL) BY MOUTH ONCE A WEEK IN THE MORNING ON CHEMOTHERAPY DAYS 120 tablet 1   ergocalciferol (VITAMIN D2) 1.25 MG (50000 UT) capsule Take 50,000 Units by mouth every Wednesday.     ferrous gluconate (FERGON) 324 MG tablet Take 1 tablet (324 mg total) by mouth 3 (three) times daily with meals. 90 tablet 0   fluticasone (FLONASE) 50 MCG/ACT nasal spray Place 2 sprays into the nose daily as needed for allergies.     gabapentin (NEURONTIN) 300 MG capsule Take 1 capsule (300  mg total) by mouth 2 (two) times daily. 60 capsule 1   levofloxacin (LEVAQUIN) 500 MG tablet Take 1 tablet (500 mg total) by mouth daily. 7 tablet 0   levothyroxine (SYNTHROID) 100 MCG tablet Take 100 mcg by mouth See admin instructions. Take 1 tablet (100 mcg) by mouth on Mondays, Tuesdays, Wednesdays, Thursdays, Fridays & Saturdays in the morning. Skip a dose on Sundays.     loratadine (CLARITIN) 10 MG tablet Take 10 mg by mouth daily as needed for allergies.     meclizine (ANTIVERT) 25 MG tablet Take 1 tablet (25 mg total) by mouth 3 (three)  times daily as needed for dizziness. 30 tablet 0   metoprolol succinate (TOPROL-XL) 25 MG 24 hr tablet Take 1 tablet (25 mg total) by mouth daily. Take with or immediately following a meal. 30 tablet 3   prochlorperazine (COMPAZINE) 10 MG tablet Take 1 tablet (10 mg total) by mouth every 6 (six) hours as needed for nausea or vomiting. 30 tablet 0   senna-docusate (SENOKOT-S) 8.6-50 MG tablet Take 1 tablet by mouth at bedtime as needed for mild constipation.     traMADol (ULTRAM) 50 MG tablet Take 1 tablet (50 mg total) by mouth every 6 (six) hours as needed. 30 tablet 0   vitamin B-12 (CYANOCOBALAMIN) 500 MCG tablet Take 500 mcg by mouth in the morning.     ALPRAZolam (XANAX) 0.25 MG tablet Take 0.25 mg by mouth 2 (two) times daily as needed for anxiety. (Patient not taking: Reported on 09/10/2022)     Current Facility-Administered Medications  Medication Dose Route Frequency Provider Last Rate Last Admin   0.9 %  sodium chloride infusion  500 mL Intravenous Once Armbruster, Carlota Raspberry, MD        REVIEW OF SYSTEMS:   Constitutional: ( - ) fevers, ( - )  chills , ( - ) night sweats Eyes: ( - ) blurriness of vision, ( - ) double vision, ( - ) watery eyes Ears, nose, mouth, throat, and face: ( - ) mucositis, ( - ) sore throat Respiratory: ( - ) cough, ( - ) dyspnea, ( - ) wheezes Cardiovascular: ( - ) palpitation, ( - ) chest discomfort, (+ ) lower extremity  swelling Gastrointestinal:  ( - ) nausea, ( - ) heartburn, ( - ) change in bowel habits Skin: ( - ) abnormal skin rashes Lymphatics: ( - ) new lymphadenopathy, ( - ) easy bruising Neurological: ( +- ) numbness, ( - ) tingling, ( - ) new weaknesses Behavioral/Psych: ( - ) mood change, ( - ) new changes  All other systems were reviewed with the patient and are negative.  PHYSICAL EXAMINATION: ECOG PERFORMANCE STATUS: 1 - Symptomatic but completely ambulatory  Vitals:   12/03/22 1146  BP: (!) 142/75  Pulse: (!) 55  Resp: 14  Temp: 98.1 F (36.7 C)  SpO2: 100%     Filed Weights   12/03/22 1146  Weight: 234 lb (106.1 kg)     GENERAL: Chronically ill-appearing elderly African-American male, alert, no distress and comfortable SKIN: skin color, texture, turgor are normal, no rashes or significant lesions EYES: conjunctiva are pink and non-injected, sclera clear LUNGS: clear to auscultation and percussion with normal breathing effort HEART: regular rate & rhythm and no murmurs and + 2 bilateral ankle edema Musculoskeletal: no cyanosis of digits and no clubbing  PSYCH: alert & oriented x 3, fluent speech NEURO: no focal motor/sensory deficits  LABORATORY DATA:  I have reviewed the data as listed    Latest Ref Rng & Units 12/03/2022   11:27 AM 11/17/2022    8:51 AM 11/03/2022    7:53 AM  CBC  WBC 4.0 - 10.5 K/uL 6.0  6.3  3.6   Hemoglobin 13.0 - 17.0 g/dL 11.6  12.0  10.6   Hematocrit 39.0 - 52.0 % 34.7  35.5  32.6   Platelets 150 - 400 K/uL 250  279  187        Latest Ref Rng & Units 12/03/2022   11:27 AM 11/17/2022    8:51 AM 11/03/2022    7:53 AM  CMP  Glucose 70 - 99 mg/dL 98  94  85   BUN 8 - 23 mg/dL 15  16  14    Creatinine 0.61 - 1.24 mg/dL 2.16  2.07  1.77   Sodium 135 - 145 mmol/L 140  141  141   Potassium 3.5 - 5.1 mmol/L 4.0  4.0  4.1   Chloride 98 - 111 mmol/L 107  108  108   CO2 22 - 32 mmol/L 26  26  28    Calcium 8.9 - 10.3 mg/dL 9.2  9.5  8.5   Total  Protein 6.5 - 8.1 g/dL 7.1  6.8  6.2   Total Bilirubin 0.3 - 1.2 mg/dL 0.5  0.6  0.5   Alkaline Phos 38 - 126 U/L 56  54  53   AST 15 - 41 U/L 22  21  20    ALT 0 - 44 U/L 16  15  15      Lab Results  Component Value Date   MPROTEIN Not Observed 11/17/2022   MPROTEIN Not Observed 11/03/2022   MPROTEIN Not Observed 10/20/2022   Lab Results  Component Value Date   KPAFRELGTCHN 21.6 (H) 11/17/2022   KPAFRELGTCHN 14.2 11/03/2022   KPAFRELGTCHN 15.6 10/20/2022   LAMBDASER 13.6 11/17/2022   LAMBDASER 9.7 11/03/2022   LAMBDASER 10.6 10/20/2022   KAPLAMBRATIO 1.59 11/17/2022   KAPLAMBRATIO 1.46 11/03/2022   KAPLAMBRATIO 1.47 10/20/2022    RADIOGRAPHIC STUDIES: No results found.  ASSESSMENT & PLAN [name]. Is a 77 y.o. male with medical history significant for IgA lambda multiple myeloma who presents for a follow up visit. .    # IgA lambda Multiple Myeloma -- Bone marrow biopsy performed on 01/16/2022 showed increased plasma cells consistent with a plasma cell neoplasm.  Normal FISH panel. --Patient meets diagnostic criteria based on kidney dysfunction and anemia. --Cycle 1 Day 1 of CyBorD chemotherapy started on 02/07/2022 --Velcade maintenance therapy started on 09/09/2022.  Plan: --Most recent SPEP from 10/20/2022 showed M protein 0.0 g/dL, kappa 21.6, lambda 13.6,ratio 1.59. Labs repeated today.  --Labs today show white blood cell 6.0, Hgb 11.6, MCV 103.9. Plt 250 --continue on maintenance Velcade every 2 weeks with monthly clinic visits.  # Leg Pain -- Likely a component of worsening neuropathy from his chemotherapy with pre-existing pain. -- continue gabapentin to 300 mg twice daily -- continue tramadol 50 mg every 6 hours as needed -- patient connected with Dr. Mickeal Skinner  #Supportive Care -- chemotherapy education complete -- port placement not required.  -- zofran 8mg  q8H PRN and compazine 10mg  PO q6H for nausea -- acyclovir 400mg  PO BID for VCZ  prophylaxis --zometa to start after dental clearance.   Orders Placed This Encounter  Procedures   Multiple Myeloma Panel (SPEP&IFE w/QIG)    Standing Status:   Future    Standing Expiration Date:   12/15/2023   Kappa/lambda light chains    Standing Status:   Future    Standing Expiration Date:   12/15/2023   CBC with Differential ([facility] Only)    Standing Status:   Future    Standing Expiration Date:   12/15/2023   CMP ([facility] only)    Standing Status:   Future    Standing Expiration Date:   12/15/2023   CBC with Differential ([facility] Only)    Standing Status:   Future    Standing Expiration Date:   12/29/2023   CMP ([facility] only)    Standing Status:  Future    Standing Expiration Date:   12/29/2023   Multiple Myeloma Panel (SPEP&IFE w/QIG)    Standing Status:   Future    Standing Expiration Date:   01/12/2024   Kappa/lambda light chains    Standing Status:   Future    Standing Expiration Date:   01/12/2024   CBC with Differential ([place] Only)    Standing Status:   Future    Standing Expiration Date:   01/12/2024   CMP ([place] only)    Standing Status:   Future    Standing Expiration Date:   01/12/2024   CBC with Differential ([facility] Only)    Standing Status:   Future    Standing Expiration Date:   01/26/2024   CMP ([place] only)    Standing Status:   Future    Standing Expiration Date:   01/26/2024   All questions were answered. The patient knows to call the clinic with any problems, questions or concerns.  I have spent a total of 30 minutes minutes of face-to-face and non-face-to-face time, preparing to see the patient, performing a medically appropriate examination, counseling and educating the patient, documenting clinical information in the electronic health record,  and care coordination.   Ledell Peoples, MD Department of Hematology/Oncology [place] at [facility] Phone: [phone] Pager:  [phone] Email: [email or web address]    12/03/2022 2:19 PM

## 2022-12-03 ENCOUNTER — Inpatient Hospital Stay: Payer: No Typology Code available for payment source

## 2022-12-03 ENCOUNTER — Inpatient Hospital Stay (HOSPITAL_BASED_OUTPATIENT_CLINIC_OR_DEPARTMENT_OTHER): Payer: No Typology Code available for payment source | Admitting: Hematology and Oncology

## 2022-12-03 ENCOUNTER — Telehealth: Payer: Self-pay | Admitting: Hematology and Oncology

## 2022-12-03 VITALS — BP 142/75 | HR 55 | Temp 98.1°F | Resp 14 | Ht 77.0 in | Wt 234.0 lb

## 2022-12-03 DIAGNOSIS — N184 Chronic kidney disease, stage 4 (severe): Secondary | ICD-10-CM

## 2022-12-03 DIAGNOSIS — D631 Anemia in chronic kidney disease: Secondary | ICD-10-CM | POA: Diagnosis not present

## 2022-12-03 DIAGNOSIS — Z5112 Encounter for antineoplastic immunotherapy: Secondary | ICD-10-CM | POA: Diagnosis not present

## 2022-12-03 DIAGNOSIS — C9000 Multiple myeloma not having achieved remission: Secondary | ICD-10-CM

## 2022-12-03 LAB — CMP (CANCER CENTER ONLY)
ALT: 16 U/L (ref 0–44)
AST: 22 U/L (ref 15–41)
Albumin: 4.5 g/dL (ref 3.5–5.0)
Alkaline Phosphatase: 56 U/L (ref 38–126)
Anion gap: 7 (ref 5–15)
BUN: 15 mg/dL (ref 8–23)
CO2: 26 mmol/L (ref 22–32)
Calcium: 9.2 mg/dL (ref 8.9–10.3)
Chloride: 107 mmol/L (ref 98–111)
Creatinine: 2.16 mg/dL — ABNORMAL HIGH (ref 0.61–1.24)
GFR, Estimated: 31 mL/min — ABNORMAL LOW (ref >60)
Glucose, Bld: 98 mg/dL (ref 70–99)
Potassium: 4 mmol/L (ref 3.5–5.1)
Sodium: 140 mmol/L (ref 135–145)
Total Bilirubin: 0.5 mg/dL (ref 0.3–1.2)
Total Protein: 7.1 g/dL (ref 6.5–8.1)

## 2022-12-03 LAB — CBC WITH DIFFERENTIAL (CANCER CENTER ONLY)
Abs Immature Granulocytes: 0.01 10*3/uL (ref 0.00–0.07)
Basophils Absolute: 0.1 10*3/uL (ref 0.0–0.1)
Basophils Relative: 1 %
Eosinophils Absolute: 0.4 10*3/uL (ref 0.0–0.5)
Eosinophils Relative: 7 %
HCT: 34.7 % — ABNORMAL LOW (ref 39.0–52.0)
Hemoglobin: 11.6 g/dL — ABNORMAL LOW (ref 13.0–17.0)
Immature Granulocytes: 0 %
Lymphocytes Relative: 18 %
Lymphs Abs: 1.1 10*3/uL (ref 0.7–4.0)
MCH: 34.7 pg — ABNORMAL HIGH (ref 26.0–34.0)
MCHC: 33.4 g/dL (ref 30.0–36.0)
MCV: 103.9 fL — ABNORMAL HIGH (ref 80.0–100.0)
Monocytes Absolute: 0.7 10*3/uL (ref 0.1–1.0)
Monocytes Relative: 12 %
Neutro Abs: 3.7 10*3/uL (ref 1.7–7.7)
Neutrophils Relative %: 62 %
Platelet Count: 250 10*3/uL (ref 150–400)
RBC: 3.34 MIL/uL — ABNORMAL LOW (ref 4.22–5.81)
RDW: 14 % (ref 11.5–15.5)
WBC Count: 6 10*3/uL (ref 4.0–10.5)
nRBC: 0 % (ref 0.0–0.2)

## 2022-12-03 LAB — LACTATE DEHYDROGENASE: LDH: 264 U/L — ABNORMAL HIGH (ref 98–192)

## 2022-12-03 MED ORDER — LEVOFLOXACIN 500 MG PO TABS: 500.0000 mg | ORAL_TABLET | Freq: Every day | ORAL | 0 refills | Status: DC

## 2022-12-03 MED ORDER — PROCHLORPERAZINE MALEATE 10 MG PO TABS
10.0000 mg | ORAL_TABLET | Freq: Once | ORAL | Status: AC
  Administered 2022-12-03: 10 mg via ORAL
  Filled 2022-12-03: qty 1

## 2022-12-03 MED ORDER — BORTEZOMIB CHEMO SQ INJECTION 3.5 MG (2.5MG/ML)
1.3000 mg/m2 | Freq: Once | INTRAMUSCULAR | Status: AC
  Administered 2022-12-03: 3 mg via SUBCUTANEOUS
  Filled 2022-12-03: qty 1.2

## 2022-12-03 NOTE — Progress Notes (Signed)
Per [name], MD okay to treat with creatinine 2.16.

## 2022-12-03 NOTE — Patient Instructions (Signed)
[facility]   Discharge Instructions: Thank you for choosing [facility] to provide your oncology and hematology care.   If you have a lab appointment with the Cancer Center, please go directly to the Cancer Center and check in at the registration area.   Wear comfortable clothing and clothing appropriate for easy access to any Portacath or PICC line.   We strive to give you quality time with your provider. You may need to reschedule your appointment if you arrive late (15 or more minutes).  Arriving late affects you and other patients whose appointments are after yours.  Also, if you miss three or more appointments without notifying the office, you may be dismissed from the clinic at the provider's discretion.      For prescription refill requests, have your pharmacy contact our office and allow 72 hours for refills to be completed.    Today you received the following chemotherapy and/or immunotherapy agents: Bortezomib (Velcade)      To help prevent nausea and vomiting after your treatment, we encourage you to take your nausea medication as directed.  BELOW ARE SYMPTOMS THAT SHOULD BE REPORTED IMMEDIATELY: *FEVER GREATER THAN 100.4 F (38 C) OR HIGHER *CHILLS OR SWEATING *NAUSEA AND VOMITING THAT IS NOT CONTROLLED WITH YOUR NAUSEA MEDICATION *UNUSUAL SHORTNESS OF BREATH *UNUSUAL BRUISING OR BLEEDING *URINARY PROBLEMS (pain or burning when urinating, or frequent urination) *BOWEL PROBLEMS (unusual diarrhea, constipation, pain near the anus) TENDERNESS IN MOUTH AND THROAT WITH OR WITHOUT PRESENCE OF ULCERS (sore throat, sores in mouth, or a toothache) UNUSUAL RASH, SWELLING OR PAIN  UNUSUAL VAGINAL DISCHARGE OR ITCHING   Items with * indicate a potential emergency and should be followed up as soon as possible or go to the Emergency Department if any problems should occur.  Please show the CHEMOTHERAPY ALERT CARD or IMMUNOTHERAPY ALERT  CARD at check-in to the Emergency Department and triage nurse.  Should you have questions after your visit or need to cancel or reschedule your appointment, please contact [facility]  Dept: [phone]  and follow the prompts.  Office hours are 8:00 a.m. to 4:30 p.m. Monday - Friday. Please note that voicemails left after 4:00 p.m. may not be returned until the following business day.  We are closed weekends and major holidays. You have access to a nurse at all times for urgent questions. Please call the main number to the clinic Dept: [phone] and follow the prompts.   For any non-urgent questions, you may also contact your provider using MyChart. We now offer e-Visits for anyone 18 and older to request care online for non-urgent symptoms. For details visit [email or web address].   Also download the MyChart app! Go to the app store, search "MyChart", open the app, select [facility], and log in with your MyChart username and password.   

## 2022-12-03 NOTE — Telephone Encounter (Signed)
Per WQ reached out to patient to schedule; left voicemail.

## 2022-12-04 ENCOUNTER — Other Ambulatory Visit: Payer: No Typology Code available for payment source

## 2022-12-04 ENCOUNTER — Ambulatory Visit: Payer: No Typology Code available for payment source | Admitting: Hematology and Oncology

## 2022-12-04 ENCOUNTER — Ambulatory Visit: Payer: No Typology Code available for payment source

## 2022-12-04 LAB — KAPPA/LAMBDA LIGHT CHAINS
Kappa free light chain: 22.5 mg/L — ABNORMAL HIGH (ref 3.3–19.4)
Kappa, lambda light chain ratio: 1.36 (ref 0.26–1.65)
Lambda free light chains: 16.6 mg/L (ref 5.7–26.3)

## 2022-12-08 LAB — MULTIPLE MYELOMA PANEL, SERUM
Albumin SerPl Elph-Mcnc: 4 g/dL (ref 2.9–4.4)
Albumin/Glob SerPl: 1.7 (ref 0.7–1.7)
Alpha 1: 0.3 g/dL (ref 0.0–0.4)
Alpha2 Glob SerPl Elph-Mcnc: 0.7 g/dL (ref 0.4–1.0)
B-Globulin SerPl Elph-Mcnc: 0.9 g/dL (ref 0.7–1.3)
Gamma Glob SerPl Elph-Mcnc: 0.6 g/dL (ref 0.4–1.8)
Globulin, Total: 2.4 g/dL (ref 2.2–3.9)
IgA: 51 mg/dL — ABNORMAL LOW (ref 61–437)
IgG (Immunoglobin G), Serum: 618 mg/dL (ref 603–1613)
IgM (Immunoglobulin M), Srm: 44 mg/dL (ref 15–143)
Total Protein ELP: 6.4 g/dL (ref 6.0–8.5)

## 2022-12-15 ENCOUNTER — Ambulatory Visit
Admission: RE | Admit: 2022-12-15 | Discharge: 2022-12-15 | Disposition: A | Discharge status: Home / Self Care | Payer: Medicare Other | Source: Ambulatory Visit | Attending: Internal Medicine | Admitting: Internal Medicine

## 2022-12-15 ENCOUNTER — Inpatient Hospital Stay: Payer: No Typology Code available for payment source | Attending: Hematology and Oncology

## 2022-12-15 ENCOUNTER — Inpatient Hospital Stay: Payer: No Typology Code available for payment source

## 2022-12-15 VITALS — BP 139/71 | HR 50 | Temp 97.7°F | Resp 17

## 2022-12-15 DIAGNOSIS — Z87891 Personal history of nicotine dependence: Secondary | ICD-10-CM | POA: Diagnosis not present

## 2022-12-15 DIAGNOSIS — G629 Polyneuropathy, unspecified: Secondary | ICD-10-CM | POA: Diagnosis not present

## 2022-12-15 DIAGNOSIS — Z8719 Personal history of other diseases of the digestive system: Secondary | ICD-10-CM | POA: Diagnosis not present

## 2022-12-15 DIAGNOSIS — I480 Paroxysmal atrial fibrillation: Secondary | ICD-10-CM | POA: Diagnosis not present

## 2022-12-15 DIAGNOSIS — Z5112 Encounter for antineoplastic immunotherapy: Secondary | ICD-10-CM | POA: Diagnosis present

## 2022-12-15 DIAGNOSIS — G620 Drug-induced polyneuropathy: Secondary | ICD-10-CM

## 2022-12-15 DIAGNOSIS — Z79899 Other long term (current) drug therapy: Secondary | ICD-10-CM | POA: Diagnosis not present

## 2022-12-15 DIAGNOSIS — Z7901 Long term (current) use of anticoagulants: Secondary | ICD-10-CM | POA: Diagnosis not present

## 2022-12-15 DIAGNOSIS — C9000 Multiple myeloma not having achieved remission: Secondary | ICD-10-CM | POA: Insufficient documentation

## 2022-12-15 DIAGNOSIS — Z881 Allergy status to other antibiotic agents status: Secondary | ICD-10-CM | POA: Insufficient documentation

## 2022-12-15 DIAGNOSIS — Z809 Family history of malignant neoplasm, unspecified: Secondary | ICD-10-CM | POA: Diagnosis not present

## 2022-12-15 LAB — CBC WITH DIFFERENTIAL (CANCER CENTER ONLY)
Abs Immature Granulocytes: 0.02 10*3/uL (ref 0.00–0.07)
Basophils Absolute: 0 10*3/uL (ref 0.0–0.1)
Basophils Relative: 1 %
Eosinophils Absolute: 0.2 10*3/uL (ref 0.0–0.5)
Eosinophils Relative: 4 %
HCT: 34.2 % — ABNORMAL LOW (ref 39.0–52.0)
Hemoglobin: 11.7 g/dL — ABNORMAL LOW (ref 13.0–17.0)
Immature Granulocytes: 1 %
Lymphocytes Relative: 24 %
Lymphs Abs: 1 10*3/uL (ref 0.7–4.0)
MCH: 35.1 pg — ABNORMAL HIGH (ref 26.0–34.0)
MCHC: 34.2 g/dL (ref 30.0–36.0)
MCV: 102.7 fL — ABNORMAL HIGH (ref 80.0–100.0)
Monocytes Absolute: 0.6 10*3/uL (ref 0.1–1.0)
Monocytes Relative: 16 %
Neutro Abs: 2.1 10*3/uL (ref 1.7–7.7)
Neutrophils Relative %: 54 %
Platelet Count: 183 10*3/uL (ref 150–400)
RBC: 3.33 MIL/uL — ABNORMAL LOW (ref 4.22–5.81)
RDW: 14.8 % (ref 11.5–15.5)
WBC Count: 3.9 10*3/uL — ABNORMAL LOW (ref 4.0–10.5)
nRBC: 0 % (ref 0.0–0.2)

## 2022-12-15 LAB — CMP (CANCER CENTER ONLY)
ALT: 14 U/L (ref 0–44)
AST: 22 U/L (ref 15–41)
Albumin: 4.3 g/dL (ref 3.5–5.0)
Alkaline Phosphatase: 50 U/L (ref 38–126)
Anion gap: 7 (ref 5–15)
BUN: 15 mg/dL (ref 8–23)
CO2: 27 mmol/L (ref 22–32)
Calcium: 9.3 mg/dL (ref 8.9–10.3)
Chloride: 108 mmol/L (ref 98–111)
Creatinine: 2.06 mg/dL — ABNORMAL HIGH (ref 0.61–1.24)
GFR, Estimated: 33 mL/min — ABNORMAL LOW (ref >60)
Glucose, Bld: 87 mg/dL (ref 70–99)
Potassium: 3.9 mmol/L (ref 3.5–5.1)
Sodium: 142 mmol/L (ref 135–145)
Total Bilirubin: 0.6 mg/dL (ref 0.3–1.2)
Total Protein: 6.6 g/dL (ref 6.5–8.1)

## 2022-12-15 MED ORDER — PROCHLORPERAZINE MALEATE 10 MG PO TABS
10.0000 mg | ORAL_TABLET | Freq: Once | ORAL | Status: AC
  Administered 2022-12-15: 10 mg via ORAL
  Filled 2022-12-15: qty 1

## 2022-12-15 MED ORDER — DEXAMETHASONE 4 MG PO TABS
40.0000 mg | ORAL_TABLET | Freq: Once | ORAL | Status: AC
  Administered 2022-12-15: 40 mg via ORAL
  Filled 2022-12-15: qty 10

## 2022-12-15 MED ORDER — BORTEZOMIB CHEMO SQ INJECTION 3.5 MG (2.5MG/ML)
1.3000 mg/m2 | Freq: Once | INTRAMUSCULAR | Status: AC
  Administered 2022-12-15: 3 mg via SUBCUTANEOUS
  Filled 2022-12-15: qty 1.2

## 2022-12-15 NOTE — Patient Instructions (Signed)
[facility]  Discharge Instructions: Thank you for choosing [facility] to provide your oncology and hematology care.   If you have a lab appointment with the Cancer Center, please go directly to the Cancer Center and check in at the registration area.   Wear comfortable clothing and clothing appropriate for easy access to any Portacath or PICC line.   We strive to give you quality time with your provider. You may need to reschedule your appointment if you arrive late (15 or more minutes).  Arriving late affects you and other patients whose appointments are after yours.  Also, if you miss three or more appointments without notifying the office, you may be dismissed from the clinic at the provider's discretion.      For prescription refill requests, have your pharmacy contact our office and allow 72 hours for refills to be completed.    Today you received the following chemotherapy and/or immunotherapy agents: Bortezomib        To help prevent nausea and vomiting after your treatment, we encourage you to take your nausea medication as directed.  BELOW ARE SYMPTOMS THAT SHOULD BE REPORTED IMMEDIATELY: *FEVER GREATER THAN 100.4 F (38 C) OR HIGHER *CHILLS OR SWEATING *NAUSEA AND VOMITING THAT IS NOT CONTROLLED WITH YOUR NAUSEA MEDICATION *UNUSUAL SHORTNESS OF BREATH *UNUSUAL BRUISING OR BLEEDING *URINARY PROBLEMS (pain or burning when urinating, or frequent urination) *BOWEL PROBLEMS (unusual diarrhea, constipation, pain near the anus) TENDERNESS IN MOUTH AND THROAT WITH OR WITHOUT PRESENCE OF ULCERS (sore throat, sores in mouth, or a toothache) UNUSUAL RASH, SWELLING OR PAIN  UNUSUAL VAGINAL DISCHARGE OR ITCHING   Items with * indicate a potential emergency and should be followed up as soon as possible or go to the Emergency Department if any problems should occur.  Please show the CHEMOTHERAPY ALERT CARD or IMMUNOTHERAPY ALERT CARD at  check-in to the Emergency Department and triage nurse.  Should you have questions after your visit or need to cancel or reschedule your appointment, please contact [facility]  Dept: [phone]  and follow the prompts.  Office hours are 8:00 a.m. to 4:30 p.m. Monday - Friday. Please note that voicemails left after 4:00 p.m. may not be returned until the following business day.  We are closed weekends and major holidays. You have access to a nurse at all times for urgent questions. Please call the main number to the clinic Dept: [phone] and follow the prompts.   For any non-urgent questions, you may also contact your provider using MyChart. We now offer e-Visits for anyone 3 and older to request care online for non-urgent symptoms. For details visit [email or web address].   Also download the MyChart app! Go to the app store, search "MyChart", open the app, select [facility], and log in with your MyChart username and password.

## 2022-12-15 NOTE — Progress Notes (Signed)
Ok to treat with creatine of 2.06 per Dr. Leonides Schanz.

## 2022-12-16 ENCOUNTER — Inpatient Hospital Stay: Payer: No Typology Code available for payment source | Admitting: Internal Medicine

## 2022-12-16 LAB — KAPPA/LAMBDA LIGHT CHAINS
Kappa free light chain: 15.7 mg/L (ref 3.3–19.4)
Kappa, lambda light chain ratio: 1.32 (ref 0.26–1.65)
Lambda free light chains: 11.9 mg/L (ref 5.7–26.3)

## 2022-12-17 ENCOUNTER — Other Ambulatory Visit: Payer: Self-pay | Admitting: Hematology and Oncology

## 2022-12-17 ENCOUNTER — Ambulatory Visit (HOSPITAL_COMMUNITY): Payer: No Typology Code available for payment source | Attending: Internal Medicine

## 2022-12-17 ENCOUNTER — Encounter: Payer: Self-pay | Admitting: Hematology and Oncology

## 2022-12-17 DIAGNOSIS — I340 Nonrheumatic mitral (valve) insufficiency: Secondary | ICD-10-CM | POA: Insufficient documentation

## 2022-12-17 LAB — ECHOCARDIOGRAM COMPLETE
Area-P 1/2: 3.68 cm2
S' Lateral: 2.8 cm

## 2022-12-18 LAB — MULTIPLE MYELOMA PANEL, SERUM
Albumin SerPl Elph-Mcnc: 4 g/dL (ref 2.9–4.4)
Albumin/Glob SerPl: 2 — ABNORMAL HIGH (ref 0.7–1.7)
Alpha 1: 0.2 g/dL (ref 0.0–0.4)
Alpha2 Glob SerPl Elph-Mcnc: 0.7 g/dL (ref 0.4–1.0)
B-Globulin SerPl Elph-Mcnc: 0.8 g/dL (ref 0.7–1.3)
Gamma Glob SerPl Elph-Mcnc: 0.4 g/dL (ref 0.4–1.8)
Globulin, Total: 2.1 g/dL — ABNORMAL LOW (ref 2.2–3.9)
IgA: 42 mg/dL — ABNORMAL LOW (ref 61–437)
IgG (Immunoglobin G), Serum: 516 mg/dL — ABNORMAL LOW (ref 603–1613)
IgM (Immunoglobulin M), Srm: 38 mg/dL (ref 15–143)
Total Protein ELP: 6.1 g/dL (ref 6.0–8.5)

## 2022-12-22 ENCOUNTER — Ambulatory Visit: Payer: No Typology Code available for payment source | Admitting: Cardiovascular Disease

## 2022-12-30 ENCOUNTER — Inpatient Hospital Stay: Payer: No Typology Code available for payment source

## 2022-12-30 ENCOUNTER — Inpatient Hospital Stay (HOSPITAL_BASED_OUTPATIENT_CLINIC_OR_DEPARTMENT_OTHER): Payer: No Typology Code available for payment source | Admitting: Physician Assistant

## 2022-12-30 VITALS — BP 138/64 | HR 54 | Temp 98.0°F | Resp 16

## 2022-12-30 DIAGNOSIS — C9000 Multiple myeloma not having achieved remission: Secondary | ICD-10-CM | POA: Diagnosis not present

## 2022-12-30 DIAGNOSIS — Z5112 Encounter for antineoplastic immunotherapy: Secondary | ICD-10-CM | POA: Diagnosis not present

## 2022-12-30 LAB — CBC WITH DIFFERENTIAL (CANCER CENTER ONLY)
Abs Immature Granulocytes: 0.01 10*3/uL (ref 0.00–0.07)
Basophils Absolute: 0.1 10*3/uL (ref 0.0–0.1)
Basophils Relative: 1 %
Eosinophils Absolute: 0.3 10*3/uL (ref 0.0–0.5)
Eosinophils Relative: 7 %
HCT: 36.5 % — ABNORMAL LOW (ref 39.0–52.0)
Hemoglobin: 11.8 g/dL — ABNORMAL LOW (ref 13.0–17.0)
Immature Granulocytes: 0 %
Lymphocytes Relative: 21 %
Lymphs Abs: 0.9 10*3/uL (ref 0.7–4.0)
MCH: 34.1 pg — ABNORMAL HIGH (ref 26.0–34.0)
MCHC: 32.3 g/dL (ref 30.0–36.0)
MCV: 105.5 fL — ABNORMAL HIGH (ref 80.0–100.0)
Monocytes Absolute: 0.7 10*3/uL (ref 0.1–1.0)
Monocytes Relative: 16 %
Neutro Abs: 2.4 10*3/uL (ref 1.7–7.7)
Neutrophils Relative %: 55 %
Platelet Count: 189 10*3/uL (ref 150–400)
RBC: 3.46 MIL/uL — ABNORMAL LOW (ref 4.22–5.81)
RDW: 14.2 % (ref 11.5–15.5)
WBC Count: 4.3 10*3/uL (ref 4.0–10.5)
nRBC: 0 % (ref 0.0–0.2)

## 2022-12-30 LAB — CMP (CANCER CENTER ONLY)
ALT: 15 U/L (ref 0–44)
AST: 23 U/L (ref 15–41)
Albumin: 4.4 g/dL (ref 3.5–5.0)
Alkaline Phosphatase: 57 U/L (ref 38–126)
Anion gap: 8 (ref 5–15)
BUN: 18 mg/dL (ref 8–23)
CO2: 23 mmol/L (ref 22–32)
Calcium: 9.5 mg/dL (ref 8.9–10.3)
Chloride: 108 mmol/L (ref 98–111)
Creatinine: 2.04 mg/dL — ABNORMAL HIGH (ref 0.61–1.24)
GFR, Estimated: 33 mL/min — ABNORMAL LOW (ref >60)
Glucose, Bld: 93 mg/dL (ref 70–99)
Potassium: 4.2 mmol/L (ref 3.5–5.1)
Sodium: 139 mmol/L (ref 135–145)
Total Bilirubin: 0.5 mg/dL (ref 0.3–1.2)
Total Protein: 6.8 g/dL (ref 6.5–8.1)

## 2022-12-30 LAB — LACTATE DEHYDROGENASE: LDH: 225 U/L — ABNORMAL HIGH (ref 98–192)

## 2022-12-30 MED ORDER — BORTEZOMIB CHEMO SQ INJECTION 3.5 MG (2.5MG/ML)
1.3000 mg/m2 | Freq: Once | INTRAMUSCULAR | Status: AC
  Administered 2022-12-30: 3 mg via SUBCUTANEOUS
  Filled 2022-12-30: qty 1.2

## 2022-12-30 MED ORDER — PROCHLORPERAZINE MALEATE 10 MG PO TABS
10.0000 mg | ORAL_TABLET | Freq: Once | ORAL | Status: AC
  Administered 2022-12-30: 10 mg via ORAL
  Filled 2022-12-30: qty 1

## 2022-12-30 NOTE — Progress Notes (Signed)
[facility] [phone]   [phone]  PROGRESS NOTE  Patient Care Team: Darren Fillers, MD as PCP - General (Internal Medicine)  Hematological/Oncological History # IgA Lambda Multiple Myeloma 05/03/2015: last visit with Dr. Arbutus Hinton at the [facility] Was followed for IgA lambda MGUS. 12/11/2021: labs show M protein 3.4, Kappa 19.2, Lambda 1576.4, ratio 0.01. Cr 3.47, Hgb 8.6, WBC 5.7, MCV 99, Plt 221 12/23/2021: establish care with Dr. Leonides Hinton  01/16/2022: Bone marrow biopsy performed, showed a 60% cellular bone marrow predominantly comprised of plasma cells making up 70 to 80%, lambda restricted.  Myeloma FISH panel showed no evidence of abnormalities. 02/07/2022: Cycle 1 Day 1 of CyBorD chemotherapy.  03/09/2022-03/17/2022: hospitalized for fever/pneumonia.  03/24/2022: Cycle 2 Day 1 of CyBorD chemotherapy.  04/22/2022: Cycle 3 Day 1 of CyBorD chemotherapy 05/19/2022-05/26/2022: Delayed start of Cycle 4 Day 1 of CyBorD chemotherapy due to neutropenia.  06/03/2022: Cycle 4 Day 1 of CyBorD chemotherapy. 25% dose reduction of cyclophosphamide due to cytopenias. 06/30/2022: Cycle 4 Day 22. Drop Velcade dose to 1mg /m2 and Cyclophosphamide 200 mg/m2  07/07/2022: Cycle 5 Day 1 of CyBorD chemotherapy with dose reductions.  08/04/2022: Cycle 6 Day 1 of CyBorD chemotherapy with dose reductions.  09/09/2022: transition to maintenance dose Velcade q 2 weeks.   Interval History:  Darren Hinton. 77 y.o. male with medical history significant for IgA lambda multiple myeloma who presents for a follow up visit. The patient's last visit was on 12/03/2022. In the interim since the last visit he has continued on maintenance Velcade.   On exam today Darren Hinton reports he has been well overall interim since his last visit.  He reports that his lower extremity strength and neuropathy has improved with physical therapy. He denies any recent falls. He reports his appetite and  energy are stable. He denies any nausea, vomiting or abdominal pain. His bowel habits are unchanged. He denies easy bruising or signs of active bleeding.   Overall he is willing and able to continue maintenance Velcade therapy at this time.  He is willing and able to proceed with treatment at this time.  He denies fevers, chills, sweats, shortness of breath, chest pain or cough. He has no other complaints. Rest of the 10 point ROS is below.   MEDICAL HISTORY:  Past Medical History:  Diagnosis Date   Allergy    Anxiety    Arthritis    Depression    Heart murmur    Hyperlipidemia    Hyperplastic colon polyp    Hyperproteinemia    Hypertension    Hypothyroidism    Mitral valve prolapse    Paroxysmal atrial fibrillation (HCC)    Thyroid disease     SURGICAL HISTORY: Past Surgical History:  Procedure Laterality Date   CARDIOVERSION N/A 03/14/2022   Procedure: CARDIOVERSION;  Surgeon: Chrystie Nose, MD;  Location: [facility] ENDOSCOPY;  Service: Cardiovascular;  Laterality: N/A;   TEE WITHOUT CARDIOVERSION N/A 03/14/2022   Procedure: TRANSESOPHAGEAL ECHOCARDIOGRAM (TEE);  Surgeon: Chrystie Nose, MD;  Location: [facility] ENDOSCOPY;  Service: Cardiovascular;  Laterality: N/A;    SOCIAL HISTORY: Social History   Socioeconomic History   Marital status: Married    Spouse name: Not on file   Number of children: Not on file   Years of education: Not on file   Highest education level: Not on file  Occupational History   Not on file  Tobacco Use   Smoking status: Former    Years:  20    Types: Cigarettes    Quit date: 10/01/1978    Years since quitting: 44.2   Smokeless tobacco: Never   Tobacco comments:    Former smoker 04/03/22  Substance and Sexual Activity   Alcohol use: Yes    Alcohol/week: 2.0 standard drinks of alcohol    Types: 2 Glasses of wine per week    Comment: occasionally   Drug use: No   Sexual activity: Not on file  Other Topics Concern   Not on file  Social History  Narrative   Not on file   Social Determinants of Health   Financial Resource Strain: Not on file  Food Insecurity: Not on file  Transportation Needs: Not on file  Physical Activity: Not on file  Stress: Not on file  Social Connections: Not on file  Intimate Partner Violence: Not on file    FAMILY HISTORY: Family History  Problem Relation Age of Onset   Cancer Father    Colon cancer Neg Hx    Esophageal cancer Neg Hx    Liver cancer Neg Hx    Pancreatic cancer Neg Hx    Rectal cancer Neg Hx    Stomach cancer Neg Hx     ALLERGIES:  is allergic to zithromax [azithromycin].  MEDICATIONS:  Current Outpatient Medications  Medication Sig Dispense Refill   acyclovir (ZOVIRAX) 400 MG tablet TAKE 1 TABLET BY MOUTH TWICE A DAY 180 tablet 1   ALPRAZolam (XANAX) 0.25 MG tablet Take 0.25 mg by mouth 2 (two) times daily as needed for anxiety.     amiodarone (PACERONE) 200 MG tablet TAKE 1 TABLET BY MOUTH EVERY DAY 90 tablet 3   amLODipine (NORVASC) 5 MG tablet TAKE 1 TABLET (5 MG TOTAL) BY MOUTH DAILY. 90 tablet 3   apixaban (ELIQUIS) 5 MG TABS tablet Take 1 tablet (5 mg total) by mouth 2 (two) times daily. 180 tablet 1   dexamethasone (DECADRON) 4 MG tablet TAKE 10 TABLETS (40 MG TOTAL) BY MOUTH ONCE A WEEK IN THE MORNING ON CHEMOTHERAPY DAYS 120 tablet 1   ergocalciferol (VITAMIN D2) 1.25 MG (50000 UT) capsule Take 50,000 Units by mouth every Wednesday.     ferrous gluconate (FERGON) 324 MG tablet Take 1 tablet (324 mg total) by mouth 3 (three) times daily with meals. 90 tablet 0   fluticasone (FLONASE) 50 MCG/ACT nasal spray Place 2 sprays into the nose daily as needed for allergies.     gabapentin (NEURONTIN) 300 MG capsule Take 1 capsule (300 mg total) by mouth 2 (two) times daily. 60 capsule 1   levofloxacin (LEVAQUIN) 500 MG tablet Take 1 tablet (500 mg total) by mouth daily. 7 tablet 0   levothyroxine (SYNTHROID) 100 MCG tablet Take 100 mcg by mouth See admin instructions. Take 1  tablet (100 mcg) by mouth on Mondays, Tuesdays, Wednesdays, Thursdays, Fridays & Saturdays in the morning. Skip a dose on Sundays.     loratadine (CLARITIN) 10 MG tablet Take 10 mg by mouth daily as needed for allergies.     meclizine (ANTIVERT) 25 MG tablet Take 1 tablet (25 mg total) by mouth 3 (three) times daily as needed for dizziness. 30 tablet 0   metoprolol succinate (TOPROL-XL) 25 MG 24 hr tablet Take 1 tablet (25 mg total) by mouth daily. Take with or immediately following a meal. 30 tablet 3   prochlorperazine (COMPAZINE) 10 MG tablet Take 1 tablet (10 mg total) by mouth every 6 (six) hours as needed for nausea  or vomiting. 30 tablet 0   rosuvastatin (CRESTOR) 20 MG tablet 1 tablet Orally Once a day for 90 days     senna-docusate (SENOKOT-S) 8.6-50 MG tablet Take 1 tablet by mouth at bedtime as needed for mild constipation.     traMADol (ULTRAM) 50 MG tablet Take 1 tablet (50 mg total) by mouth every 6 (six) hours as needed. 30 tablet 0   vitamin B-12 (CYANOCOBALAMIN) 500 MCG tablet Take 500 mcg by mouth in the morning.     Current Facility-Administered Medications  Medication Dose Route Frequency Provider Last Rate Last Admin   0.9 %  sodium chloride infusion  500 mL Intravenous Once Armbruster, Willaim Rayas, MD        REVIEW OF SYSTEMS:   Constitutional: ( - ) fevers, ( - )  chills , ( - ) night sweats Eyes: ( - ) blurriness of vision, ( - ) double vision, ( - ) watery eyes Ears, nose, mouth, throat, and face: ( - ) mucositis, ( - ) sore throat Respiratory: ( - ) cough, ( - ) dyspnea, ( - ) wheezes Cardiovascular: ( - ) palpitation, ( - ) chest discomfort, (+ ) lower extremity swelling Gastrointestinal:  ( - ) nausea, ( - ) heartburn, ( - ) change in bowel habits Skin: ( - ) abnormal skin rashes Lymphatics: ( - ) new lymphadenopathy, ( - ) easy bruising Neurological: ( +- ) numbness, ( - ) tingling, ( - ) new weaknesses Behavioral/Psych: ( - ) mood change, ( - ) new changes  All  other systems were reviewed with the patient and are negative.  PHYSICAL EXAMINATION: ECOG PERFORMANCE STATUS: 1 - Symptomatic but completely ambulatory  Vitals:   12/30/22 0837  BP: (!) 152/74  Pulse: (!) 52  Resp: 18  Temp: 97.9 F (36.6 C)  SpO2: 97%     Filed Weights   12/30/22 0837  Weight: 235 lb 8 oz (106.8 kg)     GENERAL: Chronically ill-appearing elderly African-American male, alert, no distress and comfortable SKIN: skin color, texture, turgor are normal, no rashes or significant lesions EYES: conjunctiva are pink and non-injected, sclera clear LUNGS: clear to auscultation and percussion with normal breathing effort HEART: regular rate & rhythm and no murmurs and + 2 bilateral ankle edema Musculoskeletal: no cyanosis of digits and no clubbing  PSYCH: alert & oriented x 3, fluent speech NEURO: no focal motor/sensory deficits  LABORATORY DATA:  I have reviewed the data as listed    Latest Ref Rng & Units 12/30/2022    8:05 AM 12/15/2022   11:26 AM 12/03/2022   11:27 AM  CBC  WBC 4.0 - 10.5 K/uL 4.3  3.9  6.0   Hemoglobin 13.0 - 17.0 g/dL 16.1  09.6  04.5   Hematocrit 39.0 - 52.0 % 36.5  34.2  34.7   Platelets 150 - 400 K/uL 189  183  250        Latest Ref Rng & Units 12/30/2022    8:05 AM 12/15/2022   11:26 AM 12/03/2022   11:27 AM  CMP  Glucose 70 - 99 mg/dL 93  87  98   BUN 8 - 23 mg/dL 18  15  15    Creatinine 0.61 - 1.24 mg/dL 4.09  8.11  9.14   Sodium 135 - 145 mmol/L 139  142  140   Potassium 3.5 - 5.1 mmol/L 4.2  3.9  4.0   Chloride 98 - 111 mmol/L 108  108  107   CO2 22 - 32 mmol/L 23  27  26    Calcium 8.9 - 10.3 mg/dL 9.5  9.3  9.2   Total Protein 6.5 - 8.1 g/dL 6.8  6.6  7.1   Total Bilirubin 0.3 - 1.2 mg/dL 0.5  0.6  0.5   Alkaline Phos 38 - 126 U/L 57  50  56   AST 15 - 41 U/L 23  22  22    ALT 0 - 44 U/L 15  14  16      Lab Results  Component Value Date   MPROTEIN Not Observed 12/15/2022   MPROTEIN Not Observed 12/03/2022   MPROTEIN Not  Observed 11/17/2022   Lab Results  Component Value Date   KPAFRELGTCHN 15.7 12/15/2022   KPAFRELGTCHN 22.5 (H) 12/03/2022   KPAFRELGTCHN 21.6 (H) 11/17/2022   LAMBDASER 11.9 12/15/2022   LAMBDASER 16.6 12/03/2022   LAMBDASER 13.6 11/17/2022   KAPLAMBRATIO 1.32 12/15/2022   KAPLAMBRATIO 1.36 12/03/2022   KAPLAMBRATIO 1.59 11/17/2022    RADIOGRAPHIC STUDIES: MR LUMBAR SPINE WO CONTRAST  Result Date: 12/17/2022 CLINICAL DATA:  Chronic low back pain with bilateral leg numbness. Lifting injury several years ago. History of multiple myeloma. No previous relevant surgery. EXAM: MRI LUMBAR SPINE WITHOUT CONTRAST TECHNIQUE: Multiplanar, multisequence MR imaging of the lumbar spine was performed. No intravenous contrast was administered. COMPARISON:  Abdominopelvic CT 03/14/2022. FINDINGS: Segmentation: Conventional anatomy assumed, with the last open disc space designated L5-S1.Concordant with prior imaging. Alignment: Mild convex left scoliosis. The lateral alignment is normal. Vertebrae: No evidence of acute fracture or pars defect. There are scattered T1 hyperintense osseous lesions which are not associated with any abnormal signal on inversion recovery imaging, favoring fatty deposits or hemangiomas, although potentially treated myelomatous lesions. No worrisome osseous lesions. Conus medullaris: Extends to the L2 level and appears normal. Paraspinal and other soft tissues: No significant paraspinal findings. Disc levels: Sagittal images demonstrate no significant disc space findings within the visualized lower thoracic spine. L1-2: Disc height and hydration are maintained. Mild bilateral facet hypertrophy. No spinal stenosis or nerve root encroachment. L2-3: Mild loss of disc height with annular disc bulging and endplate osteophytes. Mild facet and ligamentous hypertrophy. No significant spinal stenosis or nerve root encroachment. L3-4: Moderate loss of disc height with annular disc bulging and  endplate osteophytes. Bilateral facet hypertrophy, worse on the right. Resulting mild multifactorial spinal stenosis with mild right-greater-than-left foraminal narrowing. L4-5: Mild loss of disc height with annular disc bulging and endplate osteophytes. There is a broad-based foraminal and extraforaminal disc protrusion on the right which causes moderate to severe right foraminal narrowing and probable right L4 nerve root encroachment. Moderate facet and ligamentous hypertrophy contribute to mild to moderate multifactorial spinal stenosis, mild narrowing of the lateral recesses and moderate left foraminal narrowing. L5-S1: Disc height and hydration are maintained. Advanced facet hypertrophy contributes to mild left greater than right foraminal narrowing. The spinal canal and lateral recesses are patent. IMPRESSION: 1. Multilevel spondylosis as described. At L4-5, there is a broad-based foraminal and extraforaminal disc protrusion on the right which causes moderate to severe right foraminal narrowing and probable right L4 nerve root encroachment. There is mild to moderate multifactorial spinal stenosis at this level with moderate left foraminal narrowing. 2. Mild multifactorial spinal stenosis at L3-4 with mild right-greater-than-left foraminal narrowing. 3. Mild left greater than right foraminal narrowing at L5-S1 due to facet disease. 4. Scattered T1 hyperintense osseous lesions without associated abnormal inversion recovery signal, favoring fatty deposits or hemangiomas,  although potentially treated myelomatous lesions. No worrisome osseous lesions identified. Electronically Signed   By: Carey Bullocks M.D.   On: 12/17/2022 14:51   ECHOCARDIOGRAM COMPLETE  Result Date: 12/17/2022    ECHOCARDIOGRAM REPORT   Patient Name:   Darren Hinton. Date of Exam: 12/17/2022 Medical Rec #:  [id number]           Height:       77.0 in Accession #:    [id number]          Weight:       234.0 lb Date of Birth:  05/08/1946           BSA:          2.391 m Patient Age:    11 years            BP:           145/78 mmHg Patient Gender: M                   HR:           61 bpm. Exam Location:  [address] Procedure: 2D Echo, 3D Echo, Cardiac Doppler, Color Doppler and Strain Analysis Indications:    I34.0 Mitral Regurgitation  History:        Patient has prior history of Echocardiogram examinations, most                 recent 03/12/2022. Mitral Valve Prolapse, Arrythmias:Atrial                 Fibrillation, Signs/Symptoms:Murmur; Risk Factors:Hypertension                 and Dyslipidemia. Hypothyroidism.  Sonographer:    Sedonia Small Rodgers-Jones RDCS Referring Phys: [id number] MIHAI CROITORU IMPRESSIONS  1. Left ventricular ejection fraction, by estimation, is 55 to 60%. Left ventricular ejection fraction by 3D volume is 56 %. The left ventricle has normal function. The left ventricle has no regional wall motion abnormalities. Left ventricular diastolic  parameters were normal. The average left ventricular global longitudinal strain is -25.3 %. The global longitudinal strain is normal.  2. Right ventricular systolic function is normal. The right ventricular size is normal. There is normal pulmonary artery systolic pressure. The estimated right ventricular systolic pressure is 34.6 mmHg.  3. Left atrial size was mildly dilated.  4. Right atrial size was mildly dilated.  5. The mitral valve is normal in structure. Trivial mitral valve regurgitation.  6. Tricuspid valve regurgitation is mild to moderate.  7. The aortic valve is tricuspid. There is mild calcification of the aortic valve. There is mild thickening of the aortic valve. Aortic valve regurgitation is trivial. Aortic valve sclerosis/calcification is present, without any evidence of aortic stenosis.  8. The inferior vena cava is normal in size with greater than 50% respiratory variability, suggesting right atrial pressure of 3 mmHg. Comparison(s): Prior images reviewed side by side. The left  ventricular function has improved. FINDINGS  Left Ventricle: Left ventricular ejection fraction, by estimation, is 55 to 60%. Left ventricular ejection fraction by 3D volume is 56 %. The left ventricle has normal function. The left ventricle has no regional wall motion abnormalities. The average left ventricular global longitudinal strain is -25.3 %. The global longitudinal strain is normal. The left ventricular internal cavity size was normal in size. There is no left ventricular hypertrophy. Left ventricular diastolic parameters were normal. Right Ventricle: The right ventricular size is normal. No increase in right  ventricular wall thickness. Right ventricular systolic function is normal. There is normal pulmonary artery systolic pressure. The tricuspid regurgitant velocity is 2.81 m/s, and  with an assumed right atrial pressure of 3 mmHg, the estimated right ventricular systolic pressure is 34.6 mmHg. Left Atrium: Left atrial size was mildly dilated. Right Atrium: Right atrial size was mildly dilated. Pericardium: There is no evidence of pericardial effusion. Mitral Valve: The mitral valve is normal in structure. Trivial mitral valve regurgitation. Tricuspid Valve: The tricuspid valve is normal in structure. Tricuspid valve regurgitation is mild to moderate. Aortic Valve: The aortic valve is tricuspid. There is mild calcification of the aortic valve. There is mild thickening of the aortic valve. Aortic valve regurgitation is trivial. Aortic valve sclerosis/calcification is present, without any evidence of aortic stenosis. Pulmonic Valve: The pulmonic valve was normal in structure. Pulmonic valve regurgitation is trivial. Aorta: The aortic root and ascending aorta are structurally normal, with no evidence of dilitation. Venous: The inferior vena cava is normal in size with greater than 50% respiratory variability, suggesting right atrial pressure of 3 mmHg. IAS/Shunts: No atrial level shunt detected by color flow  Doppler.  LEFT VENTRICLE PLAX 2D LVIDd:         4.70 cm         Diastology LVIDs:         2.80 cm         LV e' medial:    7.24 cm/s LV PW:         0.80 cm         LV E/e' medial:  11.5 LV IVS:        0.60 cm         LV e' lateral:   11.85 cm/s LVOT diam:     2.00 cm         LV E/e' lateral: 7.0 LV SV:         70 LV SV Index:   29              2D LVOT Area:     3.14 cm        Longitudinal                                Strain                                2D Strain GLS  -25.1 %                                (A2C):                                2D Strain GLS  -25.9 %                                (A3C):                                2D Strain GLS  -25.0 %                                (  A4C):                                2D Strain GLS  -25.3 %                                Avg:                                 3D Volume EF                                LV 3D EF:    Left                                             ventricul                                             ar                                             ejection                                             fraction                                             by 3D                                             volume is                                             56 %.                                 3D Volume EF:                                3D EF:        56 %                                LV EDV:       184 ml  LV ESV:       80 ml                                LV SV:        104 ml RIGHT VENTRICLE             IVC RV Basal diam:  4.70 cm     IVC diam: 1.60 cm RV S prime:     15.30 cm/s TAPSE (M-mode): 3.1 cm LEFT ATRIUM             Index        RIGHT ATRIUM           Index LA diam:        4.25 cm 1.78 cm/m   RA Area:     19.60 cm LA Vol (A2C):   78.6 ml 32.87 ml/m  RA Volume:   62.30 ml  26.05 ml/m LA Vol (A4C):   87.2 ml 36.47 ml/m LA Biplane Vol: 85.3 ml 35.67 ml/m  AORTIC VALVE LVOT Vmax:   97.10 cm/s LVOT  Vmean:  62.950 cm/s LVOT VTI:    0.223 m  AORTA Ao Root diam: 3.70 cm Ao Asc diam:  3.70 cm MITRAL VALVE               TRICUSPID VALVE MV Area (PHT): 3.68 cm    TR Peak grad:   31.6 mmHg MV Decel Time: 206 msec    TR Vmax:        281.00 cm/s MV E velocity: 82.95 cm/s MV A velocity: 96.00 cm/s  SHUNTS MV E/A ratio:  0.86        Systemic VTI:  0.22 m                            Systemic Diam: 2.00 cm Rachelle Hora Croitoru MD Electronically signed by Thurmon Fair MD Signature Date/Time: 12/17/2022/2:21:02 PM    Final     ASSESSMENT & PLAN Darren Hinton. Is a 77 y.o. male with medical history significant for IgA lambda multiple myeloma who presents for a follow up visit. .    # IgA lambda Multiple Myeloma -- Bone marrow biopsy performed on 01/16/2022 showed increased plasma cells consistent with a plasma cell neoplasm.  Normal FISH panel. --Patient meets diagnostic criteria based on kidney dysfunction and anemia. --Cycle 1 Day 1 of CyBorD chemotherapy started on 02/07/2022 --Velcade maintenance therapy started on 09/09/2022.  Plan: --Most recent SPEP from 12/15/2022 showed M protein not detected. , kappa 15.7, lambda 11.9,ratio 1.32. Labs repeated today.  --Labs today show white blood cell 4.3, Hgb 11.8, MCV 105.5. Plt 189 --continue on maintenance Velcade every 2 weeks with monthly clinic visits.  # Leg Pain -- Likely a component of worsening neuropathy from his chemotherapy with pre-existing pain. -- continue gabapentin to 300 mg twice daily -- continue tramadol 50 mg every 6 hours as needed -- patient connected with Dr. Barbaraann Cao  #Supportive Care -- chemotherapy education complete -- port placement not required.  -- zofran  q8H PRN and compazine  PO q6H for nausea -- acyclovir  PO BID for VCZ prophylaxis --zometa to start after dental clearance.   No orders of the defined types were placed in this encounter.  All questions were answered. The patient knows to call the clinic with any  problems, questions or concerns.  I have  spent a total of 30 minutes minutes of face-to-face and non-face-to-face time, preparing to see the patient, performing a medically appropriate examination, counseling and educating the patient, documenting clinical information in the electronic health record,  and care coordination.   Georga Kaufmann PA-C Dept of Hematology and Oncology [facility] Phone: [phone]     12/30/2022 4:34 PM

## 2022-12-30 NOTE — Progress Notes (Signed)
Per Georga Kaufmann, PA, OK to treat with Cr 2.04.  Patient states he took his dexamethasone at home prior to arrival.

## 2022-12-30 NOTE — Patient Instructions (Signed)
[facility]  Discharge Instructions: Thank you for choosing [facility] to provide your oncology and hematology care.   If you have a lab appointment with the Cancer Center, please go directly to the Cancer Center and check in at the registration area.   Wear comfortable clothing and clothing appropriate for easy access to any Portacath or PICC line.   We strive to give you quality time with your provider. You may need to reschedule your appointment if you arrive late (15 or more minutes).  Arriving late affects you and other patients whose appointments are after yours.  Also, if you miss three or more appointments without notifying the office, you may be dismissed from the clinic at the provider's discretion.      For prescription refill requests, have your pharmacy contact our office and allow 72 hours for refills to be completed.    Today you received the following chemotherapy and/or immunotherapy agents: Velcade     To help prevent nausea and vomiting after your treatment, we encourage you to take your nausea medication as directed.  BELOW ARE SYMPTOMS THAT SHOULD BE REPORTED IMMEDIATELY: *FEVER GREATER THAN 100.4 F (38 C) OR HIGHER *CHILLS OR SWEATING *NAUSEA AND VOMITING THAT IS NOT CONTROLLED WITH YOUR NAUSEA MEDICATION *UNUSUAL SHORTNESS OF BREATH *UNUSUAL BRUISING OR BLEEDING *URINARY PROBLEMS (pain or burning when urinating, or frequent urination) *BOWEL PROBLEMS (unusual diarrhea, constipation, pain near the anus) TENDERNESS IN MOUTH AND THROAT WITH OR WITHOUT PRESENCE OF ULCERS (sore throat, sores in mouth, or a toothache) UNUSUAL RASH, SWELLING OR PAIN  UNUSUAL VAGINAL DISCHARGE OR ITCHING   Items with * indicate a potential emergency and should be followed up as soon as possible or go to the Emergency Department if any problems should occur.  Please show the CHEMOTHERAPY ALERT CARD or IMMUNOTHERAPY ALERT CARD at check-in  to the Emergency Department and triage nurse.  Should you have questions after your visit or need to cancel or reschedule your appointment, please contact [facility]  Dept: [phone]  and follow the prompts.  Office hours are 8:00 a.m. to 4:30 p.m. Monday - Friday. Please note that voicemails left after 4:00 p.m. may not be returned until the following business day.  We are closed weekends and major holidays. You have access to a nurse at all times for urgent questions. Please call the main number to the clinic Dept: [phone] and follow the prompts.   For any non-urgent questions, you may also contact your provider using MyChart. We now offer e-Visits for anyone 18 and older to request care online for non-urgent symptoms. For details visit [email or web address].   Also download the MyChart app! Go to the app store, search "MyChart", open the app, select [facility], and log in with your MyChart username and password.   

## 2022-12-31 LAB — KAPPA/LAMBDA LIGHT CHAINS
Kappa free light chain: 15.7 mg/L (ref 3.3–19.4)
Kappa, lambda light chain ratio: 1.28 (ref 0.26–1.65)
Lambda free light chains: 12.3 mg/L (ref 5.7–26.3)

## 2023-01-04 LAB — MULTIPLE MYELOMA PANEL, SERUM
Albumin SerPl Elph-Mcnc: 3.8 g/dL (ref 2.9–4.4)
Albumin/Glob SerPl: 1.6 (ref 0.7–1.7)
Alpha 1: 0.3 g/dL (ref 0.0–0.4)
Alpha2 Glob SerPl Elph-Mcnc: 0.8 g/dL (ref 0.4–1.0)
B-Globulin SerPl Elph-Mcnc: 0.9 g/dL (ref 0.7–1.3)
Gamma Glob SerPl Elph-Mcnc: 0.5 g/dL (ref 0.4–1.8)
Globulin, Total: 2.4 g/dL (ref 2.2–3.9)
IgA: 36 mg/dL — ABNORMAL LOW (ref 61–437)
IgG (Immunoglobin G), Serum: 555 mg/dL — ABNORMAL LOW (ref 603–1613)
IgM (Immunoglobulin M), Srm: 48 mg/dL (ref 15–143)
Total Protein ELP: 6.2 g/dL (ref 6.0–8.5)

## 2023-01-12 ENCOUNTER — Inpatient Hospital Stay: Payer: No Typology Code available for payment source

## 2023-01-12 ENCOUNTER — Inpatient Hospital Stay: Payer: No Typology Code available for payment source | Attending: Hematology and Oncology

## 2023-01-12 VITALS — BP 156/76 | HR 53 | Temp 98.4°F | Resp 17 | Wt 235.0 lb

## 2023-01-12 DIAGNOSIS — C9000 Multiple myeloma not having achieved remission: Secondary | ICD-10-CM | POA: Insufficient documentation

## 2023-01-12 DIAGNOSIS — Z881 Allergy status to other antibiotic agents status: Secondary | ICD-10-CM | POA: Insufficient documentation

## 2023-01-12 DIAGNOSIS — Z7901 Long term (current) use of anticoagulants: Secondary | ICD-10-CM | POA: Insufficient documentation

## 2023-01-12 DIAGNOSIS — I480 Paroxysmal atrial fibrillation: Secondary | ICD-10-CM | POA: Insufficient documentation

## 2023-01-12 DIAGNOSIS — G629 Polyneuropathy, unspecified: Secondary | ICD-10-CM | POA: Diagnosis not present

## 2023-01-12 DIAGNOSIS — I341 Nonrheumatic mitral (valve) prolapse: Secondary | ICD-10-CM | POA: Diagnosis not present

## 2023-01-12 DIAGNOSIS — G8929 Other chronic pain: Secondary | ICD-10-CM | POA: Diagnosis not present

## 2023-01-12 DIAGNOSIS — Z87891 Personal history of nicotine dependence: Secondary | ICD-10-CM | POA: Diagnosis not present

## 2023-01-12 DIAGNOSIS — Z809 Family history of malignant neoplasm, unspecified: Secondary | ICD-10-CM | POA: Diagnosis not present

## 2023-01-12 DIAGNOSIS — Z5112 Encounter for antineoplastic immunotherapy: Secondary | ICD-10-CM | POA: Diagnosis present

## 2023-01-12 DIAGNOSIS — Z79899 Other long term (current) drug therapy: Secondary | ICD-10-CM | POA: Diagnosis not present

## 2023-01-12 DIAGNOSIS — Z8719 Personal history of other diseases of the digestive system: Secondary | ICD-10-CM | POA: Diagnosis not present

## 2023-01-12 DIAGNOSIS — M545 Low back pain, unspecified: Secondary | ICD-10-CM | POA: Diagnosis not present

## 2023-01-12 LAB — CMP (CANCER CENTER ONLY)
ALT: 16 U/L (ref 0–44)
AST: 20 U/L (ref 15–41)
Albumin: 4.3 g/dL (ref 3.5–5.0)
Alkaline Phosphatase: 49 U/L (ref 38–126)
Anion gap: 8 (ref 5–15)
BUN: 15 mg/dL (ref 8–23)
CO2: 24 mmol/L (ref 22–32)
Calcium: 8.9 mg/dL (ref 8.9–10.3)
Chloride: 108 mmol/L (ref 98–111)
Creatinine: 2.02 mg/dL — ABNORMAL HIGH (ref 0.61–1.24)
GFR, Estimated: 33 mL/min — ABNORMAL LOW (ref >60)
Glucose, Bld: 109 mg/dL — ABNORMAL HIGH (ref 70–99)
Potassium: 3.9 mmol/L (ref 3.5–5.1)
Sodium: 140 mmol/L (ref 135–145)
Total Bilirubin: 0.5 mg/dL (ref 0.3–1.2)
Total Protein: 6.6 g/dL (ref 6.5–8.1)

## 2023-01-12 LAB — CBC WITH DIFFERENTIAL (CANCER CENTER ONLY)
Abs Immature Granulocytes: 0.01 10*3/uL (ref 0.00–0.07)
Basophils Absolute: 0 10*3/uL (ref 0.0–0.1)
Basophils Relative: 1 %
Eosinophils Absolute: 0.2 10*3/uL (ref 0.0–0.5)
Eosinophils Relative: 5 %
HCT: 36.4 % — ABNORMAL LOW (ref 39.0–52.0)
Hemoglobin: 11.9 g/dL — ABNORMAL LOW (ref 13.0–17.0)
Immature Granulocytes: 0 %
Lymphocytes Relative: 21 %
Lymphs Abs: 0.9 10*3/uL (ref 0.7–4.0)
MCH: 33.5 pg (ref 26.0–34.0)
MCHC: 32.7 g/dL (ref 30.0–36.0)
MCV: 102.5 fL — ABNORMAL HIGH (ref 80.0–100.0)
Monocytes Absolute: 0.5 10*3/uL (ref 0.1–1.0)
Monocytes Relative: 13 %
Neutro Abs: 2.5 10*3/uL (ref 1.7–7.7)
Neutrophils Relative %: 60 %
Platelet Count: 191 10*3/uL (ref 150–400)
RBC: 3.55 MIL/uL — ABNORMAL LOW (ref 4.22–5.81)
RDW: 14.1 % (ref 11.5–15.5)
WBC Count: 4.1 10*3/uL (ref 4.0–10.5)
nRBC: 0 % (ref 0.0–0.2)

## 2023-01-12 LAB — LACTATE DEHYDROGENASE: LDH: 215 U/L — ABNORMAL HIGH (ref 98–192)

## 2023-01-12 MED ORDER — PROCHLORPERAZINE MALEATE 10 MG PO TABS
10.0000 mg | ORAL_TABLET | Freq: Once | ORAL | Status: AC
  Administered 2023-01-12: 10 mg via ORAL
  Filled 2023-01-12: qty 1

## 2023-01-12 MED ORDER — BORTEZOMIB CHEMO SQ INJECTION 3.5 MG (2.5MG/ML)
1.3000 mg/m2 | Freq: Once | INTRAMUSCULAR | Status: AC
  Administered 2023-01-12: 3 mg via SUBCUTANEOUS
  Filled 2023-01-12: qty 1.2

## 2023-01-12 NOTE — Progress Notes (Signed)
OK to treat per Georga Kaufmann, PA with creatinine of 2.02

## 2023-01-12 NOTE — Patient Instructions (Signed)
[facility]  Discharge Instructions: Thank you for choosing [facility] to provide your oncology and hematology care.   If you have a lab appointment with the Cancer Center, please go directly to the Cancer Center and check in at the registration area.   Wear comfortable clothing and clothing appropriate for easy access to any Portacath or PICC line.   We strive to give you quality time with your provider. You may need to reschedule your appointment if you arrive late (15 or more minutes).  Arriving late affects you and other patients whose appointments are after yours.  Also, if you miss three or more appointments without notifying the office, you may be dismissed from the clinic at the provider's discretion.      For prescription refill requests, have your pharmacy contact our office and allow 72 hours for refills to be completed.    Today you received the following chemotherapy and/or immunotherapy agents: Velcade     To help prevent nausea and vomiting after your treatment, we encourage you to take your nausea medication as directed.  BELOW ARE SYMPTOMS THAT SHOULD BE REPORTED IMMEDIATELY: *FEVER GREATER THAN 100.4 F (38 C) OR HIGHER *CHILLS OR SWEATING *NAUSEA AND VOMITING THAT IS NOT CONTROLLED WITH YOUR NAUSEA MEDICATION *UNUSUAL SHORTNESS OF BREATH *UNUSUAL BRUISING OR BLEEDING *URINARY PROBLEMS (pain or burning when urinating, or frequent urination) *BOWEL PROBLEMS (unusual diarrhea, constipation, pain near the anus) TENDERNESS IN MOUTH AND THROAT WITH OR WITHOUT PRESENCE OF ULCERS (sore throat, sores in mouth, or a toothache) UNUSUAL RASH, SWELLING OR PAIN  UNUSUAL VAGINAL DISCHARGE OR ITCHING   Items with * indicate a potential emergency and should be followed up as soon as possible or go to the Emergency Department if any problems should occur.  Please show the CHEMOTHERAPY ALERT CARD or IMMUNOTHERAPY ALERT CARD at check-in  to the Emergency Department and triage nurse.  Should you have questions after your visit or need to cancel or reschedule your appointment, please contact [facility]  Dept: [phone]  and follow the prompts.  Office hours are 8:00 a.m. to 4:30 p.m. Monday - Friday. Please note that voicemails left after 4:00 p.m. may not be returned until the following business day.  We are closed weekends and major holidays. You have access to a nurse at all times for urgent questions. Please call the main number to the clinic Dept: [phone] and follow the prompts.   For any non-urgent questions, you may also contact your provider using MyChart. We now offer e-Visits for anyone 18 and older to request care online for non-urgent symptoms. For details visit [email or web address].   Also download the MyChart app! Go to the app store, search "MyChart", open the app, select [facility], and log in with your MyChart username and password.   

## 2023-01-13 LAB — KAPPA/LAMBDA LIGHT CHAINS
Kappa free light chain: 15 mg/L (ref 3.3–19.4)
Kappa, lambda light chain ratio: 1.21 (ref 0.26–1.65)
Lambda free light chains: 12.4 mg/L (ref 5.7–26.3)

## 2023-01-15 ENCOUNTER — Encounter: Payer: Self-pay | Admitting: Physician Assistant

## 2023-01-16 ENCOUNTER — Other Ambulatory Visit: Payer: Self-pay

## 2023-01-16 LAB — MULTIPLE MYELOMA PANEL, SERUM
Albumin SerPl Elph-Mcnc: 3.7 g/dL (ref 2.9–4.4)
Albumin/Glob SerPl: 1.7 (ref 0.7–1.7)
Alpha 1: 0.2 g/dL (ref 0.0–0.4)
Alpha2 Glob SerPl Elph-Mcnc: 0.7 g/dL (ref 0.4–1.0)
B-Globulin SerPl Elph-Mcnc: 0.9 g/dL (ref 0.7–1.3)
Gamma Glob SerPl Elph-Mcnc: 0.4 g/dL (ref 0.4–1.8)
Globulin, Total: 2.2 g/dL (ref 2.2–3.9)
IgA: 32 mg/dL — ABNORMAL LOW (ref 61–437)
IgG (Immunoglobin G), Serum: 526 mg/dL — ABNORMAL LOW (ref 603–1613)
IgM (Immunoglobulin M), Srm: 38 mg/dL (ref 15–143)
Total Protein ELP: 5.9 g/dL — ABNORMAL LOW (ref 6.0–8.5)

## 2023-01-26 ENCOUNTER — Other Ambulatory Visit: Payer: Self-pay | Admitting: Physician Assistant

## 2023-01-26 DIAGNOSIS — C9000 Multiple myeloma not having achieved remission: Secondary | ICD-10-CM

## 2023-01-27 ENCOUNTER — Inpatient Hospital Stay: Payer: No Typology Code available for payment source

## 2023-01-27 ENCOUNTER — Other Ambulatory Visit: Payer: Self-pay | Admitting: Hematology and Oncology

## 2023-01-27 ENCOUNTER — Inpatient Hospital Stay (HOSPITAL_BASED_OUTPATIENT_CLINIC_OR_DEPARTMENT_OTHER): Payer: No Typology Code available for payment source | Admitting: Physician Assistant

## 2023-01-27 VITALS — BP 145/80 | HR 53 | Temp 98.1°F | Resp 15 | Wt 237.7 lb

## 2023-01-27 DIAGNOSIS — C9000 Multiple myeloma not having achieved remission: Secondary | ICD-10-CM

## 2023-01-27 DIAGNOSIS — Z5112 Encounter for antineoplastic immunotherapy: Secondary | ICD-10-CM | POA: Diagnosis not present

## 2023-01-27 LAB — CBC WITH DIFFERENTIAL (CANCER CENTER ONLY)
Abs Immature Granulocytes: 0.01 10*3/uL (ref 0.00–0.07)
Basophils Absolute: 0.1 10*3/uL (ref 0.0–0.1)
Basophils Relative: 1 %
Eosinophils Absolute: 0.3 10*3/uL (ref 0.0–0.5)
Eosinophils Relative: 6 %
HCT: 36.8 % — ABNORMAL LOW (ref 39.0–52.0)
Hemoglobin: 12 g/dL — ABNORMAL LOW (ref 13.0–17.0)
Immature Granulocytes: 0 %
Lymphocytes Relative: 23 %
Lymphs Abs: 1 10*3/uL (ref 0.7–4.0)
MCH: 33.7 pg (ref 26.0–34.0)
MCHC: 32.6 g/dL (ref 30.0–36.0)
MCV: 103.4 fL — ABNORMAL HIGH (ref 80.0–100.0)
Monocytes Absolute: 0.7 10*3/uL (ref 0.1–1.0)
Monocytes Relative: 15 %
Neutro Abs: 2.5 10*3/uL (ref 1.7–7.7)
Neutrophils Relative %: 55 %
Platelet Count: 199 10*3/uL (ref 150–400)
RBC: 3.56 MIL/uL — ABNORMAL LOW (ref 4.22–5.81)
RDW: 14.6 % (ref 11.5–15.5)
WBC Count: 4.5 10*3/uL (ref 4.0–10.5)
nRBC: 0 % (ref 0.0–0.2)

## 2023-01-27 LAB — CMP (CANCER CENTER ONLY)
ALT: 19 U/L (ref 0–44)
AST: 23 U/L (ref 15–41)
Albumin: 4.4 g/dL (ref 3.5–5.0)
Alkaline Phosphatase: 59 U/L (ref 38–126)
Anion gap: 7 (ref 5–15)
BUN: 15 mg/dL (ref 8–23)
CO2: 26 mmol/L (ref 22–32)
Calcium: 9.1 mg/dL (ref 8.9–10.3)
Chloride: 108 mmol/L (ref 98–111)
Creatinine: 2.09 mg/dL — ABNORMAL HIGH (ref 0.61–1.24)
GFR, Estimated: 32 mL/min — ABNORMAL LOW (ref >60)
Glucose, Bld: 115 mg/dL — ABNORMAL HIGH (ref 70–99)
Potassium: 4.2 mmol/L (ref 3.5–5.1)
Sodium: 141 mmol/L (ref 135–145)
Total Bilirubin: 0.5 mg/dL (ref 0.3–1.2)
Total Protein: 6.7 g/dL (ref 6.5–8.1)

## 2023-01-27 LAB — LACTATE DEHYDROGENASE: LDH: 231 U/L — ABNORMAL HIGH (ref 98–192)

## 2023-01-27 MED ORDER — PROCHLORPERAZINE MALEATE 10 MG PO TABS
10.0000 mg | ORAL_TABLET | Freq: Once | ORAL | Status: AC
  Administered 2023-01-27: 10 mg via ORAL
  Filled 2023-01-27: qty 1

## 2023-01-27 MED ORDER — BORTEZOMIB CHEMO SQ INJECTION 3.5 MG (2.5MG/ML)
1.3000 mg/m2 | Freq: Once | INTRAMUSCULAR | Status: AC
  Administered 2023-01-27: 3 mg via SUBCUTANEOUS
  Filled 2023-01-27: qty 1.2

## 2023-01-27 NOTE — Progress Notes (Signed)
Patient states he took his dexamethasone at home today prior to arriving.  Per Georga Kaufmann, PA, OK to treat with Cr 2.09.

## 2023-01-27 NOTE — Progress Notes (Signed)
[facility] [phone]   [phone]  PROGRESS NOTE  Patient Care Team: [name], MD as PCP - General (Internal Medicine)  Hematological/Oncological History # IgA Lambda Multiple Myeloma 05/03/2015: last visit with Dr. Arbutus Ped at the [facility]. Was followed for IgA lambda MGUS. 12/11/2021: labs show M protein 3.4, Kappa 19.2, Lambda 1576.4, ratio 0.01. Cr 3.47, Hgb 8.6, WBC 5.7, MCV 99, Plt 221 12/23/2021: establish care with Dr. Leonides Schanz  01/16/2022: Bone marrow biopsy performed, showed a 60% cellular bone marrow predominantly comprised of plasma cells making up 70 to 80%, lambda restricted.  Myeloma FISH panel showed no evidence of abnormalities. 02/07/2022: Cycle 1 Day 1 of CyBorD chemotherapy.  03/09/2022-03/17/2022: hospitalized for fever/pneumonia.  03/24/2022: Cycle 2 Day 1 of CyBorD chemotherapy.  04/22/2022: Cycle 3 Day 1 of CyBorD chemotherapy 05/19/2022-05/26/2022: Delayed start of Cycle 4 Day 1 of CyBorD chemotherapy due to neutropenia.  06/03/2022: Cycle 4 Day 1 of CyBorD chemotherapy. 25% dose reduction of cyclophosphamide due to cytopenias. 06/30/2022: Cycle 4 Day 22. Drop Velcade dose to 1mg /m2 and Cyclophosphamide 200 mg/m2  07/07/2022: Cycle 5 Day 1 of CyBorD chemotherapy with dose reductions.  08/04/2022: Cycle 6 Day 1 of CyBorD chemotherapy with dose reductions.  09/09/2022: transition to maintenance dose Velcade q 2 weeks.   Interval History:  Ryshawn Wiegand. 77 y.o. male with medical history significant for IgA lambda multiple myeloma who presents for a follow up visit. The patient's last visit was on 12/30/2022. In the interim since the last visit he has continued on maintenance Velcade.   On exam today Mr. Angier reports he is tolerating the Velcade therapy. He reports his energy and appetite are stable. He continues to have neuropathy in his lower legs with pain that is improving. He reports chronic low back pain with  radiculopathy to his legs. He denies any nausea, vomiting or abdominal pain. His bowel habits are unchanged. He denies easy bruising or signs of active bleeding.   Overall he is willing and able to continue maintenance Velcade therapy at this time.  He is willing and able to proceed with treatment at this time.  He denies fevers, chills, sweats, shortness of breath, chest pain or cough. He has no other complaints. Rest of the 10 point ROS is below.   MEDICAL HISTORY:  Past Medical History:  Diagnosis Date   Allergy    Anxiety    Arthritis    Depression    Heart murmur    Hyperlipidemia    Hyperplastic colon polyp    Hyperproteinemia    Hypertension    Hypothyroidism    Mitral valve prolapse    Paroxysmal atrial fibrillation (HCC)    Thyroid disease     SURGICAL HISTORY: Past Surgical History:  Procedure Laterality Date   CARDIOVERSION N/A 03/14/2022   Procedure: CARDIOVERSION;  Surgeon: Chrystie Nose, MD;  Location: [facility] ENDOSCOPY;  Service: Cardiovascular;  Laterality: N/A;   TEE WITHOUT CARDIOVERSION N/A 03/14/2022   Procedure: TRANSESOPHAGEAL ECHOCARDIOGRAM (TEE);  Surgeon: Chrystie Nose, MD;  Location: [facility] ENDOSCOPY;  Service: Cardiovascular;  Laterality: N/A;    SOCIAL HISTORY: Social History   Socioeconomic History   Marital status: Married    Spouse name: Not on file   Number of children: Not on file   Years of education: Not on file   Highest education level: Not on file  Occupational History   Not on file  Tobacco Use   Smoking status: Former  Years: 20    Types: Cigarettes    Quit date: 10/01/1978    Years since quitting: 44.2   Smokeless tobacco: Never   Tobacco comments:    Former smoker 04/03/22  Substance and Sexual Activity   Alcohol use: Yes    Alcohol/week: 2.0 standard drinks of alcohol    Types: 2 Glasses of wine per week    Comment: occasionally   Drug use: No   Sexual activity: Not on file  Other Topics Concern   Not on file  Social  History Narrative   Not on file   Social Determinants of Health   Financial Resource Strain: Not on file  Food Insecurity: Not on file  Transportation Needs: Not on file  Physical Activity: Not on file  Stress: Not on file  Social Connections: Not on file  Intimate Partner Violence: Not on file    FAMILY HISTORY: Family History  Problem Relation Age of Onset   Cancer Father    Colon cancer Neg Hx    Esophageal cancer Neg Hx    Liver cancer Neg Hx    Pancreatic cancer Neg Hx    Rectal cancer Neg Hx    Stomach cancer Neg Hx     ALLERGIES:  is allergic to zithromax [azithromycin].  MEDICATIONS:  Current Outpatient Medications  Medication Sig Dispense Refill   acyclovir (ZOVIRAX) 400 MG tablet TAKE 1 TABLET BY MOUTH TWICE A DAY 180 tablet 1   ALPRAZolam (XANAX) 0.25 MG tablet Take 0.25 mg by mouth 2 (two) times daily as needed for anxiety.     amiodarone (PACERONE) 200 MG tablet TAKE 1 TABLET BY MOUTH EVERY DAY 90 tablet 3   amLODipine (NORVASC) 5 MG tablet TAKE 1 TABLET (5 MG TOTAL) BY MOUTH DAILY. 90 tablet 3   apixaban (ELIQUIS) 5 MG TABS tablet Take 1 tablet (5 mg total) by mouth 2 (two) times daily. 180 tablet 1   dexamethasone (DECADRON) 4 MG tablet TAKE 10 TABLETS (40 MG TOTAL) BY MOUTH ONCE A WEEK IN THE MORNING ON CHEMOTHERAPY DAYS 120 tablet 1   ergocalciferol (VITAMIN D2) 1.25 MG (50000 UT) capsule Take 50,000 Units by mouth every Wednesday.     ferrous gluconate (FERGON) 324 MG tablet Take 1 tablet (324 mg total) by mouth 3 (three) times daily with meals. 90 tablet 0   fluticasone (FLONASE) 50 MCG/ACT nasal spray Place 2 sprays into the nose daily as needed for allergies.     gabapentin (NEURONTIN) 300 MG capsule Take 1 capsule (300 mg total) by mouth 2 (two) times daily. 60 capsule 1   levofloxacin (LEVAQUIN) 500 MG tablet Take 1 tablet (500 mg total) by mouth daily. 7 tablet 0   levothyroxine (SYNTHROID) 100 MCG tablet Take 100 mcg by mouth See admin instructions.  Take 1 tablet (100 mcg) by mouth on Mondays, Tuesdays, Wednesdays, Thursdays, Fridays & Saturdays in the morning. Skip a dose on Sundays.     loratadine (CLARITIN) 10 MG tablet Take 10 mg by mouth daily as needed for allergies.     meclizine (ANTIVERT) 25 MG tablet Take 1 tablet (25 mg total) by mouth 3 (three) times daily as needed for dizziness. 30 tablet 0   metoprolol succinate (TOPROL-XL) 25 MG 24 hr tablet Take 1 tablet (25 mg total) by mouth daily. Take with or immediately following a meal. 30 tablet 3   prochlorperazine (COMPAZINE) 10 MG tablet Take 1 tablet (10 mg total) by mouth every 6 (six) hours as needed for  nausea or vomiting. 30 tablet 0   rosuvastatin (CRESTOR) 20 MG tablet 1 tablet Orally Once a day for 90 days     senna-docusate (SENOKOT-S) 8.6-50 MG tablet Take 1 tablet by mouth at bedtime as needed for mild constipation.     traMADol (ULTRAM) 50 MG tablet Take 1 tablet (50 mg total) by mouth every 6 (six) hours as needed. 30 tablet 0   vitamin B-12 (CYANOCOBALAMIN) 500 MCG tablet Take 500 mcg by mouth in the morning.     Current Facility-Administered Medications  Medication Dose Route Frequency Provider Last Rate Last Admin   0.9 %  sodium chloride infusion  500 mL Intravenous Once Armbruster, Willaim Rayas, MD        REVIEW OF SYSTEMS:   Constitutional: ( - ) fevers, ( - )  chills , ( - ) night sweats Eyes: ( - ) blurriness of vision, ( - ) double vision, ( - ) watery eyes Ears, nose, mouth, throat, and face: ( - ) mucositis, ( - ) sore throat Respiratory: ( - ) cough, ( - ) dyspnea, ( - ) wheezes Cardiovascular: ( - ) palpitation, ( - ) chest discomfort, (+ ) lower extremity swelling Gastrointestinal:  ( - ) nausea, ( - ) heartburn, ( - ) change in bowel habits Skin: ( - ) abnormal skin rashes Lymphatics: ( - ) new lymphadenopathy, ( - ) easy bruising Neurological: ( +- ) numbness, ( - ) tingling, ( - ) new weaknesses Behavioral/Psych: ( - ) mood change, ( - ) new changes   All other systems were reviewed with the patient and are negative.  PHYSICAL EXAMINATION: ECOG PERFORMANCE STATUS: 1 - Symptomatic but completely ambulatory  Vitals:   12/30/22 0837  BP: (!) 152/74  Pulse: (!) 52  Resp: 18  Temp: 97.9 F (36.6 C)  SpO2: 97%     Filed Weights   12/30/22 0837  Weight: 235 lb 8 oz (106.8 kg)     GENERAL: Chronically ill-appearing elderly African-American male, alert, no distress and comfortable SKIN: skin color, texture, turgor are normal, no rashes or significant lesions EYES: conjunctiva are pink and non-injected, sclera clear LUNGS: clear to auscultation and percussion with normal breathing effort HEART: regular rate & rhythm and no murmurs and + 2 bilateral ankle edema Musculoskeletal: no cyanosis of digits and no clubbing  PSYCH: alert & oriented x 3, fluent speech NEURO: no focal motor/sensory deficits  LABORATORY DATA:  I have reviewed the data as listed    Latest Ref Rng & Units 12/30/2022    8:05 AM 12/15/2022   11:26 AM 12/03/2022   11:27 AM  CBC  WBC 4.0 - 10.5 K/uL 4.3  3.9  6.0   Hemoglobin 13.0 - 17.0 g/dL 16.1  09.6  04.5   Hematocrit 39.0 - 52.0 % 36.5  34.2  34.7   Platelets 150 - 400 K/uL 189  183  250        Latest Ref Rng & Units 12/30/2022    8:05 AM 12/15/2022   11:26 AM 12/03/2022   11:27 AM  CMP  Glucose 70 - 99 mg/dL 93  87  98   BUN 8 - 23 mg/dL 18  15  15    Creatinine 0.61 - 1.24 mg/dL 4.09  8.11  9.14   Sodium 135 - 145 mmol/L 139  142  140   Potassium 3.5 - 5.1 mmol/L 4.2  3.9  4.0   Chloride 98 - 111 mmol/L 108  108  107   CO2 22 - 32 mmol/L 23  27  26    Calcium 8.9 - 10.3 mg/dL 9.5  9.3  9.2   Total Protein 6.5 - 8.1 g/dL 6.8  6.6  7.1   Total Bilirubin 0.3 - 1.2 mg/dL 0.5  0.6  0.5   Alkaline Phos 38 - 126 U/L 57  50  56   AST 15 - 41 U/L 23  22  22    ALT 0 - 44 U/L 15  14  16      Lab Results  Component Value Date   MPROTEIN Not Observed 12/15/2022   MPROTEIN Not Observed 12/03/2022    MPROTEIN Not Observed 11/17/2022   Lab Results  Component Value Date   KPAFRELGTCHN 15.7 12/15/2022   KPAFRELGTCHN 22.5 (H) 12/03/2022   KPAFRELGTCHN 21.6 (H) 11/17/2022   LAMBDASER 11.9 12/15/2022   LAMBDASER 16.6 12/03/2022   LAMBDASER 13.6 11/17/2022   KAPLAMBRATIO 1.32 12/15/2022   KAPLAMBRATIO 1.36 12/03/2022   KAPLAMBRATIO 1.59 11/17/2022    RADIOGRAPHIC STUDIES: MR LUMBAR SPINE WO CONTRAST  Result Date: 12/17/2022 CLINICAL DATA:  Chronic low back pain with bilateral leg numbness. Lifting injury several years ago. History of multiple myeloma. No previous relevant surgery. EXAM: MRI LUMBAR SPINE WITHOUT CONTRAST TECHNIQUE: Multiplanar, multisequence MR imaging of the lumbar spine was performed. No intravenous contrast was administered. COMPARISON:  Abdominopelvic CT 03/14/2022. FINDINGS: Segmentation: Conventional anatomy assumed, with the last open disc space designated L5-S1.Concordant with prior imaging. Alignment: Mild convex left scoliosis. The lateral alignment is normal. Vertebrae: No evidence of acute fracture or pars defect. There are scattered T1 hyperintense osseous lesions which are not associated with any abnormal signal on inversion recovery imaging, favoring fatty deposits or hemangiomas, although potentially treated myelomatous lesions. No worrisome osseous lesions. Conus medullaris: Extends to the L2 level and appears normal. Paraspinal and other soft tissues: No significant paraspinal findings. Disc levels: Sagittal images demonstrate no significant disc space findings within the visualized lower thoracic spine. L1-2: Disc height and hydration are maintained. Mild bilateral facet hypertrophy. No spinal stenosis or nerve root encroachment. L2-3: Mild loss of disc height with annular disc bulging and endplate osteophytes. Mild facet and ligamentous hypertrophy. No significant spinal stenosis or nerve root encroachment. L3-4: Moderate loss of disc height with annular disc  bulging and endplate osteophytes. Bilateral facet hypertrophy, worse on the right. Resulting mild multifactorial spinal stenosis with mild right-greater-than-left foraminal narrowing. L4-5: Mild loss of disc height with annular disc bulging and endplate osteophytes. There is a broad-based foraminal and extraforaminal disc protrusion on the right which causes moderate to severe right foraminal narrowing and probable right L4 nerve root encroachment. Moderate facet and ligamentous hypertrophy contribute to mild to moderate multifactorial spinal stenosis, mild narrowing of the lateral recesses and moderate left foraminal narrowing. L5-S1: Disc height and hydration are maintained. Advanced facet hypertrophy contributes to mild left greater than right foraminal narrowing. The spinal canal and lateral recesses are patent. IMPRESSION: 1. Multilevel spondylosis as described. At L4-5, there is a broad-based foraminal and extraforaminal disc protrusion on the right which causes moderate to severe right foraminal narrowing and probable right L4 nerve root encroachment. There is mild to moderate multifactorial spinal stenosis at this level with moderate left foraminal narrowing. 2. Mild multifactorial spinal stenosis at L3-4 with mild right-greater-than-left foraminal narrowing. 3. Mild left greater than right foraminal narrowing at L5-S1 due to facet disease. 4. Scattered T1 hyperintense osseous lesions without associated abnormal inversion recovery signal, favoring fatty deposits or hemangiomas,  although potentially treated myelomatous lesions. No worrisome osseous lesions identified. Electronically Signed   By: Carey Bullocks M.D.   On: 12/17/2022 14:51   ECHOCARDIOGRAM COMPLETE  Result Date: 12/17/2022    ECHOCARDIOGRAM REPORT   Patient Name:   Darren Hinton. Date of Exam: 12/17/2022 Medical Rec #:  [id number]           Height:       77.0 in Accession #:    [id number]          Weight:       234.0 lb Date of Birth:   [birth year]          BSA:          2.391 m Patient Age:    34 years            BP:           145/78 mmHg Patient Gender: M                   HR:           61 bpm. Exam Location:  [address] Procedure: 2D Echo, 3D Echo, Cardiac Doppler, Color Doppler and Strain Analysis Indications:    I34.0 Mitral Regurgitation  History:        Patient has prior history of Echocardiogram examinations, most                 recent 03/12/2022. Mitral Valve Prolapse, Arrythmias:Atrial                 Fibrillation, Signs/Symptoms:Murmur; Risk Factors:Hypertension                 and Dyslipidemia. Hypothyroidism.  Sonographer:    Sedonia Small Rodgers-Jones RDCS Referring Phys: [id number] MIHAI CROITORU IMPRESSIONS  1. Left ventricular ejection fraction, by estimation, is 55 to 60%. Left ventricular ejection fraction by 3D volume is 56 %. The left ventricle has normal function. The left ventricle has no regional wall motion abnormalities. Left ventricular diastolic  parameters were normal. The average left ventricular global longitudinal strain is -25.3 %. The global longitudinal strain is normal.  2. Right ventricular systolic function is normal. The right ventricular size is normal. There is normal pulmonary artery systolic pressure. The estimated right ventricular systolic pressure is 34.6 mmHg.  3. Left atrial size was mildly dilated.  4. Right atrial size was mildly dilated.  5. The mitral valve is normal in structure. Trivial mitral valve regurgitation.  6. Tricuspid valve regurgitation is mild to moderate.  7. The aortic valve is tricuspid. There is mild calcification of the aortic valve. There is mild thickening of the aortic valve. Aortic valve regurgitation is trivial. Aortic valve sclerosis/calcification is present, without any evidence of aortic stenosis.  8. The inferior vena cava is normal in size with greater than 50% respiratory variability, suggesting right atrial pressure of 3 mmHg. Comparison(s): Prior images reviewed side by  side. The left ventricular function has improved. FINDINGS  Left Ventricle: Left ventricular ejection fraction, by estimation, is 55 to 60%. Left ventricular ejection fraction by 3D volume is 56 %. The left ventricle has normal function. The left ventricle has no regional wall motion abnormalities. The average left ventricular global longitudinal strain is -25.3 %. The global longitudinal strain is normal. The left ventricular internal cavity size was normal in size. There is no left ventricular hypertrophy. Left ventricular diastolic parameters were normal. Right Ventricle: The right ventricular size is normal. No increase in right  ventricular wall thickness. Right ventricular systolic function is normal. There is normal pulmonary artery systolic pressure. The tricuspid regurgitant velocity is 2.81 m/s, and  with an assumed right atrial pressure of 3 mmHg, the estimated right ventricular systolic pressure is 34.6 mmHg. Left Atrium: Left atrial size was mildly dilated. Right Atrium: Right atrial size was mildly dilated. Pericardium: There is no evidence of pericardial effusion. Mitral Valve: The mitral valve is normal in structure. Trivial mitral valve regurgitation. Tricuspid Valve: The tricuspid valve is normal in structure. Tricuspid valve regurgitation is mild to moderate. Aortic Valve: The aortic valve is tricuspid. There is mild calcification of the aortic valve. There is mild thickening of the aortic valve. Aortic valve regurgitation is trivial. Aortic valve sclerosis/calcification is present, without any evidence of aortic stenosis. Pulmonic Valve: The pulmonic valve was normal in structure. Pulmonic valve regurgitation is trivial. Aorta: The aortic root and ascending aorta are structurally normal, with no evidence of dilitation. Venous: The inferior vena cava is normal in size with greater than 50% respiratory variability, suggesting right atrial pressure of 3 mmHg. IAS/Shunts: No atrial level shunt  detected by color flow Doppler.  LEFT VENTRICLE PLAX 2D LVIDd:         4.70 cm         Diastology LVIDs:         2.80 cm         LV e' medial:    7.24 cm/s LV PW:         0.80 cm         LV E/e' medial:  11.5 LV IVS:        0.60 cm         LV e' lateral:   11.85 cm/s LVOT diam:     2.00 cm         LV E/e' lateral: 7.0 LV SV:         70 LV SV Index:   29              2D LVOT Area:     3.14 cm        Longitudinal                                Strain                                2D Strain GLS  -25.1 %                                (A2C):                                2D Strain GLS  -25.9 %                                (A3C):                                2D Strain GLS  -25.0 %                                (  A4C):                                2D Strain GLS  -25.3 %                                Avg:                                 3D Volume EF                                LV 3D EF:    Left                                             ventricul                                             ar                                             ejection                                             fraction                                             by 3D                                             volume is                                             56 %.                                 3D Volume EF:                                3D EF:        56 %                                LV EDV:       184 ml  LV ESV:       80 ml                                LV SV:        104 ml RIGHT VENTRICLE             IVC RV Basal diam:  4.70 cm     IVC diam: 1.60 cm RV S prime:     15.30 cm/s TAPSE (M-mode): 3.1 cm LEFT ATRIUM             Index        RIGHT ATRIUM           Index LA diam:        4.25 cm 1.78 cm/m   RA Area:     19.60 cm LA Vol (A2C):   78.6 ml 32.87 ml/m  RA Volume:   62.30 ml  26.05 ml/m LA Vol (A4C):   87.2 ml 36.47 ml/m LA Biplane Vol: 85.3 ml 35.67 ml/m  AORTIC VALVE LVOT Vmax:    97.10 cm/s LVOT Vmean:  62.950 cm/s LVOT VTI:    0.223 m  AORTA Ao Root diam: 3.70 cm Ao Asc diam:  3.70 cm MITRAL VALVE               TRICUSPID VALVE MV Area (PHT): 3.68 cm    TR Peak grad:   31.6 mmHg MV Decel Time: 206 msec    TR Vmax:        281.00 cm/s MV E velocity: 82.95 cm/s MV A velocity: 96.00 cm/s  SHUNTS MV E/A ratio:  0.86        Systemic VTI:  0.22 m                            Systemic Diam: 2.00 cm Rachelle Hora Croitoru MD Electronically signed by Thurmon Fair MD Signature Date/Time: 12/17/2022/2:21:02 PM    Final     ASSESSMENT & PLAN [name] Is a 77 y.o. male with medical history significant for IgA lambda multiple myeloma who presents for a follow up visit. .    # IgA lambda Multiple Myeloma -- Bone marrow biopsy performed on 01/16/2022 showed increased plasma cells consistent with a plasma cell neoplasm.  Normal FISH panel. --Patient meets diagnostic criteria based on kidney dysfunction and anemia. --Cycle 1 Day 1 of CyBorD chemotherapy started on 02/07/2022 --Velcade maintenance therapy started on 09/09/2022.  Plan: --Most recent SPEP from 01/12/2023 showed M protein not detected. , kappa 15.0, lambda 12.4,ratio 1.21.  --Labs today show white blood cell 4.5, Hgb 12.0, MCV 103.4. Plt 199. Creatinine is stable at 2.09. Calcium level is normal. LFTs normal.  --continue on maintenance Velcade every 2 weeks with monthly clinic visits.  # Leg Pain -- Likely a component of worsening neuropathy from his chemotherapy with pre-existing pain. -- continue gabapentin to 300 mg twice daily -- continue tramadol 50 mg every 6 hours as needed -- patient connected with Dr. Barbaraann Cao  #Supportive Care -- chemotherapy education complete -- port placement not required.  -- zofran 8mg  q8H PRN and compazine 10mg  PO q6H for nausea -- acyclovir 400mg  PO BID for VCZ prophylaxis --zometa to start after dental clearance.   No orders of the defined types were placed in this encounter.  All  questions were answered. The patient knows to call the clinic  with any problems, questions or concerns.  I have spent a total of 30 minutes minutes of face-to-face and non-face-to-face time, preparing to see the patient, performing a medically appropriate examination, counseling and educating the patient, documenting clinical information in the electronic health record,  and care coordination.   Georga Kaufmann PA-C Dept of Hematology and Oncology [facility] at [facility] Phone: [phone]     12/30/2022 4:34 PM

## 2023-01-27 NOTE — Patient Instructions (Signed)
[facility]  Discharge Instructions: Thank you for choosing [facility] to provide your oncology and hematology care.   If you have a lab appointment with the Cancer Center, please go directly to the Cancer Center and check in at the registration area.   Wear comfortable clothing and clothing appropriate for easy access to any Portacath or PICC line.   We strive to give you quality time with your provider. You may need to reschedule your appointment if you arrive late (15 or more minutes).  Arriving late affects you and other patients whose appointments are after yours.  Also, if you miss three or more appointments without notifying the office, you may be dismissed from the clinic at the provider's discretion.      For prescription refill requests, have your pharmacy contact our office and allow 72 hours for refills to be completed.    Today you received the following chemotherapy and/or immunotherapy agents: Velcade     To help prevent nausea and vomiting after your treatment, we encourage you to take your nausea medication as directed.  BELOW ARE SYMPTOMS THAT SHOULD BE REPORTED IMMEDIATELY: *FEVER GREATER THAN 100.4 F (38 C) OR HIGHER *CHILLS OR SWEATING *NAUSEA AND VOMITING THAT IS NOT CONTROLLED WITH YOUR NAUSEA MEDICATION *UNUSUAL SHORTNESS OF BREATH *UNUSUAL BRUISING OR BLEEDING *URINARY PROBLEMS (pain or burning when urinating, or frequent urination) *BOWEL PROBLEMS (unusual diarrhea, constipation, pain near the anus) TENDERNESS IN MOUTH AND THROAT WITH OR WITHOUT PRESENCE OF ULCERS (sore throat, sores in mouth, or a toothache) UNUSUAL RASH, SWELLING OR PAIN  UNUSUAL VAGINAL DISCHARGE OR ITCHING   Items with * indicate a potential emergency and should be followed up as soon as possible or go to the Emergency Department if any problems should occur.  Please show the CHEMOTHERAPY ALERT CARD or IMMUNOTHERAPY ALERT CARD at check-in  to the Emergency Department and triage nurse.  Should you have questions after your visit or need to cancel or reschedule your appointment, please contact [facility]  Dept: [phone]  and follow the prompts.  Office hours are 8:00 a.m. to 4:30 p.m. Monday - Friday. Please note that voicemails left after 4:00 p.m. may not be returned until the following business day.  We are closed weekends and major holidays. You have access to a nurse at all times for urgent questions. Please call the main number to the clinic Dept: [phone] and follow the prompts.   For any non-urgent questions, you may also contact your provider using MyChart. We now offer e-Visits for anyone 18 and older to request care online for non-urgent symptoms. For details visit [email or web address].   Also download the MyChart app! Go to the app store, search "MyChart", open the app, select [facility], and log in with your MyChart username and password.   

## 2023-02-06 ENCOUNTER — Other Ambulatory Visit: Payer: Self-pay

## 2023-02-09 ENCOUNTER — Other Ambulatory Visit: Payer: Self-pay

## 2023-02-09 ENCOUNTER — Inpatient Hospital Stay: Payer: No Typology Code available for payment source

## 2023-02-09 ENCOUNTER — Inpatient Hospital Stay: Payer: No Typology Code available for payment source | Attending: Hematology and Oncology

## 2023-02-09 VITALS — BP 162/69 | HR 52 | Temp 98.1°F | Resp 16 | Ht 77.0 in | Wt 235.6 lb

## 2023-02-09 DIAGNOSIS — Z8719 Personal history of other diseases of the digestive system: Secondary | ICD-10-CM | POA: Diagnosis not present

## 2023-02-09 DIAGNOSIS — Z7901 Long term (current) use of anticoagulants: Secondary | ICD-10-CM | POA: Insufficient documentation

## 2023-02-09 DIAGNOSIS — Z5112 Encounter for antineoplastic immunotherapy: Secondary | ICD-10-CM | POA: Diagnosis not present

## 2023-02-09 DIAGNOSIS — C9000 Multiple myeloma not having achieved remission: Secondary | ICD-10-CM | POA: Diagnosis present

## 2023-02-09 DIAGNOSIS — I480 Paroxysmal atrial fibrillation: Secondary | ICD-10-CM | POA: Diagnosis not present

## 2023-02-09 DIAGNOSIS — Z79899 Other long term (current) drug therapy: Secondary | ICD-10-CM | POA: Diagnosis not present

## 2023-02-09 DIAGNOSIS — G620 Drug-induced polyneuropathy: Secondary | ICD-10-CM | POA: Insufficient documentation

## 2023-02-09 DIAGNOSIS — R200 Anesthesia of skin: Secondary | ICD-10-CM | POA: Diagnosis not present

## 2023-02-09 DIAGNOSIS — T451X5A Adverse effect of antineoplastic and immunosuppressive drugs, initial encounter: Secondary | ICD-10-CM | POA: Insufficient documentation

## 2023-02-09 DIAGNOSIS — Z809 Family history of malignant neoplasm, unspecified: Secondary | ICD-10-CM | POA: Diagnosis not present

## 2023-02-09 DIAGNOSIS — Z87891 Personal history of nicotine dependence: Secondary | ICD-10-CM | POA: Insufficient documentation

## 2023-02-09 DIAGNOSIS — I341 Nonrheumatic mitral (valve) prolapse: Secondary | ICD-10-CM | POA: Diagnosis not present

## 2023-02-09 DIAGNOSIS — R202 Paresthesia of skin: Secondary | ICD-10-CM | POA: Diagnosis not present

## 2023-02-09 DIAGNOSIS — Z881 Allergy status to other antibiotic agents status: Secondary | ICD-10-CM | POA: Insufficient documentation

## 2023-02-09 DIAGNOSIS — Z79624 Long term (current) use of inhibitors of nucleotide synthesis: Secondary | ICD-10-CM | POA: Insufficient documentation

## 2023-02-09 LAB — CBC WITH DIFFERENTIAL (CANCER CENTER ONLY)
Abs Immature Granulocytes: 0.01 10*3/uL (ref 0.00–0.07)
Basophils Absolute: 0 10*3/uL (ref 0.0–0.1)
Basophils Relative: 1 %
Eosinophils Absolute: 0.2 10*3/uL (ref 0.0–0.5)
Eosinophils Relative: 4 %
HCT: 37 % — ABNORMAL LOW (ref 39.0–52.0)
Hemoglobin: 12 g/dL — ABNORMAL LOW (ref 13.0–17.0)
Immature Granulocytes: 0 %
Lymphocytes Relative: 21 %
Lymphs Abs: 0.9 10*3/uL (ref 0.7–4.0)
MCH: 33.1 pg (ref 26.0–34.0)
MCHC: 32.4 g/dL (ref 30.0–36.0)
MCV: 101.9 fL — ABNORMAL HIGH (ref 80.0–100.0)
Monocytes Absolute: 0.5 10*3/uL (ref 0.1–1.0)
Monocytes Relative: 12 %
Neutro Abs: 2.7 10*3/uL (ref 1.7–7.7)
Neutrophils Relative %: 62 %
Platelet Count: 193 10*3/uL (ref 150–400)
RBC: 3.63 MIL/uL — ABNORMAL LOW (ref 4.22–5.81)
RDW: 14.3 % (ref 11.5–15.5)
WBC Count: 4.4 10*3/uL (ref 4.0–10.5)
nRBC: 0 % (ref 0.0–0.2)

## 2023-02-09 LAB — CMP (CANCER CENTER ONLY)
ALT: 18 U/L (ref 0–44)
AST: 21 U/L (ref 15–41)
Albumin: 4.4 g/dL (ref 3.5–5.0)
Alkaline Phosphatase: 56 U/L (ref 38–126)
Anion gap: 8 (ref 5–15)
BUN: 17 mg/dL (ref 8–23)
CO2: 25 mmol/L (ref 22–32)
Calcium: 9.2 mg/dL (ref 8.9–10.3)
Chloride: 108 mmol/L (ref 98–111)
Creatinine: 2.04 mg/dL — ABNORMAL HIGH (ref 0.61–1.24)
GFR, Estimated: 33 mL/min — ABNORMAL LOW (ref >60)
Glucose, Bld: 99 mg/dL (ref 70–99)
Potassium: 4.3 mmol/L (ref 3.5–5.1)
Sodium: 141 mmol/L (ref 135–145)
Total Bilirubin: 0.5 mg/dL (ref 0.3–1.2)
Total Protein: 6.8 g/dL (ref 6.5–8.1)

## 2023-02-09 MED ORDER — BORTEZOMIB CHEMO SQ INJECTION 3.5 MG (2.5MG/ML)
1.3000 mg/m2 | Freq: Once | INTRAMUSCULAR | Status: AC
  Administered 2023-02-09: 3 mg via SUBCUTANEOUS
  Filled 2023-02-09: qty 1.2

## 2023-02-09 MED ORDER — PROCHLORPERAZINE MALEATE 10 MG PO TABS
10.0000 mg | ORAL_TABLET | Freq: Once | ORAL | Status: AC
  Administered 2023-02-09: 10 mg via ORAL
  Filled 2023-02-09: qty 1

## 2023-02-09 NOTE — Progress Notes (Signed)
Per Leonides Schanz, MD okay to treat with creatinine 2.04. Pt states he took decadron prior to appointment time.

## 2023-02-09 NOTE — Patient Instructions (Signed)
[facility]  Discharge Instructions: Thank you for choosing [facility] to provide your oncology and hematology care.   If you have a lab appointment with the Cancer Center, please go directly to the Cancer Center and check in at the registration area.   Wear comfortable clothing and clothing appropriate for easy access to any Portacath or PICC line.   We strive to give you quality time with your provider. You may need to reschedule your appointment if you arrive late (15 or more minutes).  Arriving late affects you and other patients whose appointments are after yours.  Also, if you miss three or more appointments without notifying the office, you may be dismissed from the clinic at the provider's discretion.      For prescription refill requests, have your pharmacy contact our office and allow 72 hours for refills to be completed.    Today you received the following chemotherapy and/or immunotherapy agents: Velcade     To help prevent nausea and vomiting after your treatment, we encourage you to take your nausea medication as directed.  BELOW ARE SYMPTOMS THAT SHOULD BE REPORTED IMMEDIATELY: *FEVER GREATER THAN 100.4 F (38 C) OR HIGHER *CHILLS OR SWEATING *NAUSEA AND VOMITING THAT IS NOT CONTROLLED WITH YOUR NAUSEA MEDICATION *UNUSUAL SHORTNESS OF BREATH *UNUSUAL BRUISING OR BLEEDING *URINARY PROBLEMS (pain or burning when urinating, or frequent urination) *BOWEL PROBLEMS (unusual diarrhea, constipation, pain near the anus) TENDERNESS IN MOUTH AND THROAT WITH OR WITHOUT PRESENCE OF ULCERS (sore throat, sores in mouth, or a toothache) UNUSUAL RASH, SWELLING OR PAIN  UNUSUAL VAGINAL DISCHARGE OR ITCHING   Items with * indicate a potential emergency and should be followed up as soon as possible or go to the Emergency Department if any problems should occur.  Please show the CHEMOTHERAPY ALERT CARD or IMMUNOTHERAPY ALERT CARD at check-in  to the Emergency Department and triage nurse.  Should you have questions after your visit or need to cancel or reschedule your appointment, please contact [facility]  Dept: [phone]  and follow the prompts.  Office hours are 8:00 a.m. to 4:30 p.m. Monday - Friday. Please note that voicemails left after 4:00 p.m. may not be returned until the following business day.  We are closed weekends and major holidays. You have access to a nurse at all times for urgent questions. Please call the main number to the clinic Dept: [phone] and follow the prompts.   For any non-urgent questions, you may also contact your provider using MyChart. We now offer e-Visits for anyone 18 and older to request care online for non-urgent symptoms. For details visit [email or web address].   Also download the MyChart app! Go to the app store, search "MyChart", open the app, select [facility], and log in with your MyChart username and password.   

## 2023-02-10 LAB — KAPPA/LAMBDA LIGHT CHAINS
Kappa free light chain: 13.8 mg/L (ref 3.3–19.4)
Kappa, lambda light chain ratio: 1.07 (ref 0.26–1.65)
Lambda free light chains: 12.9 mg/L (ref 5.7–26.3)

## 2023-02-12 ENCOUNTER — Other Ambulatory Visit: Payer: Self-pay | Admitting: Hematology and Oncology

## 2023-02-16 LAB — MULTIPLE MYELOMA PANEL, SERUM
Albumin SerPl Elph-Mcnc: 3.8 g/dL (ref 2.9–4.4)
Albumin/Glob SerPl: 1.7 (ref 0.7–1.7)
Alpha 1: 0.2 g/dL (ref 0.0–0.4)
Alpha2 Glob SerPl Elph-Mcnc: 0.7 g/dL (ref 0.4–1.0)
B-Globulin SerPl Elph-Mcnc: 0.9 g/dL (ref 0.7–1.3)
Gamma Glob SerPl Elph-Mcnc: 0.5 g/dL (ref 0.4–1.8)
Globulin, Total: 2.3 g/dL (ref 2.2–3.9)
IgA: 29 mg/dL — ABNORMAL LOW (ref 61–437)
IgG (Immunoglobin G), Serum: 536 mg/dL — ABNORMAL LOW (ref 603–1613)
IgM (Immunoglobulin M), Srm: 42 mg/dL (ref 15–143)
Total Protein ELP: 6.1 g/dL (ref 6.0–8.5)

## 2023-02-22 ENCOUNTER — Other Ambulatory Visit: Payer: Self-pay

## 2023-02-23 ENCOUNTER — Ambulatory Visit
Payer: No Typology Code available for payment source | Attending: Cardiovascular Disease | Admitting: Cardiovascular Disease

## 2023-02-23 VITALS — BP 138/82 | HR 55 | Ht 78.0 in | Wt 236.0 lb

## 2023-02-23 DIAGNOSIS — R000 Tachycardia, unspecified: Secondary | ICD-10-CM

## 2023-02-23 DIAGNOSIS — I10 Essential (primary) hypertension: Secondary | ICD-10-CM

## 2023-02-23 DIAGNOSIS — I4819 Other persistent atrial fibrillation: Secondary | ICD-10-CM | POA: Diagnosis not present

## 2023-02-23 DIAGNOSIS — I43 Cardiomyopathy in diseases classified elsewhere: Secondary | ICD-10-CM

## 2023-02-23 DIAGNOSIS — Z79899 Other long term (current) drug therapy: Secondary | ICD-10-CM

## 2023-02-23 DIAGNOSIS — E039 Hypothyroidism, unspecified: Secondary | ICD-10-CM

## 2023-02-23 DIAGNOSIS — Z5181 Encounter for therapeutic drug level monitoring: Secondary | ICD-10-CM

## 2023-02-23 DIAGNOSIS — I340 Nonrheumatic mitral (valve) insufficiency: Secondary | ICD-10-CM

## 2023-02-23 DIAGNOSIS — D6869 Other thrombophilia: Secondary | ICD-10-CM | POA: Diagnosis not present

## 2023-02-23 DIAGNOSIS — N1832 Chronic kidney disease, stage 3b: Secondary | ICD-10-CM

## 2023-02-23 NOTE — Progress Notes (Signed)
Cardiology Office Note:    Date:  03/01/2023   ID:  Darren Hinton., DOB [birth year], MRN [id number]  PCP:  Darren Fillers, MD   [facility] Providers Cardiologist:  None     Referring MD: Darren Fillers, MD   Chief Complaint  Patient presents with   Atrial Fibrillation    History of Present Illness:    Darren Hinton. is a 77 y.o. male with  persistent atrial fibrillation, with early recurrence following cardioversion in July 2023, multiple myeloma (IgA lambda, Dr. Leonides Schanz, currently onCyBorD chemotherapy), hypertension returning in follow-up.  Amiodarone was started with plan for repeat cardioversion in November 2023.  When he presented for the cardioversion and he was back in normal sinus rhythm, and as far as I can tell he has been in sinus rhythm ever since.  His problems with lower extremity edema have largely resolved.  Follow-up with echocardiography shows complete normalization of left ventricular systolic function confirming the suspicion that he has tachycardia cardiomyopathy.  He continues to take amiodarone 200 mg daily and metoprolol succinate 25 mg daily and has not had symptomatic bradycardia, syncope or significant fatigue.  He is compliant with anticoagulation with Eliquis and has not had any bleeding problems.  Of note, he was completely oblivious to the arrhythmia, he never had palpitations.  His last cancer center visit on 01/27/2023 for IgA lambda multiple myeloma CyBorD treatment mentions that he has good tolerance to treatment with Velcade although he does have lower extremity neuropathy.  He is an Designer, television/film set.  He has been able to get his Eliquis from the [place] hospital.  Past Medical History:  Diagnosis Date   Allergy    Anxiety    Arthritis    Depression    Heart murmur    Hyperlipidemia    Hyperplastic colon polyp    Hyperproteinemia    Hypertension    Hypothyroidism    Mitral valve prolapse    Paroxysmal atrial  fibrillation (HCC)    Thyroid disease     Past Surgical History:  Procedure Laterality Date   CARDIOVERSION N/A 03/14/2022   Procedure: CARDIOVERSION;  Surgeon: Chrystie Nose, MD;  Location: [facility] ENDOSCOPY;  Service: Cardiovascular;  Laterality: N/A;   TEE WITHOUT CARDIOVERSION N/A 03/14/2022   Procedure: TRANSESOPHAGEAL ECHOCARDIOGRAM (TEE);  Surgeon: Chrystie Nose, MD;  Location: [facility] ENDOSCOPY;  Service: Cardiovascular;  Laterality: N/A;    Current Medications: Current Meds  Medication Sig   acyclovir (ZOVIRAX) 400 MG tablet TAKE 1 TABLET BY MOUTH TWICE A DAY   ALPRAZolam (XANAX) 0.25 MG tablet Take 0.25 mg by mouth 2 (two) times daily as needed for anxiety.   amiodarone (PACERONE) 200 MG tablet TAKE 1 TABLET BY MOUTH EVERY DAY   amLODipine (NORVASC) 5 MG tablet TAKE 1 TABLET (5 MG TOTAL) BY MOUTH DAILY.   dexamethasone (DECADRON) 4 MG tablet TAKE 10 TABLETS (40 MG TOTAL) BY MOUTH ONCE A WEEK IN THE MORNING ON CHEMOTHERAPY DAYS   ergocalciferol (VITAMIN D2) 1.25 MG (50000 UT) capsule Take 50,000 Units by mouth every Wednesday.   ferrous gluconate (FERGON) 324 MG tablet Take 1 tablet (324 mg total) by mouth 3 (three) times daily with meals.   fluticasone (FLONASE) 50 MCG/ACT nasal spray Place 2 sprays into the nose daily as needed for allergies.   gabapentin (NEURONTIN) 300 MG capsule Take 1 capsule (300 mg total) by mouth 2 (two) times daily.   levofloxacin (LEVAQUIN) 500 MG tablet Take 1  tablet (500 mg total) by mouth daily.   levothyroxine (SYNTHROID) 100 MCG tablet Take 100 mcg by mouth See admin instructions. Take 1 tablet (100 mcg) by mouth on Mondays, Tuesdays, Wednesdays, Thursdays, Fridays & Saturdays in the morning. Skip a dose on Sundays.   loratadine (CLARITIN) 10 MG tablet Take 10 mg by mouth daily as needed for allergies.   meclizine (ANTIVERT) 25 MG tablet Take 1 tablet (25 mg total) by mouth 3 (three) times daily as needed for dizziness.   metoprolol succinate (TOPROL-XL)  25 MG 24 hr tablet Take 1 tablet (25 mg total) by mouth daily. Take with or immediately following a meal.   Olmesartan-amLODIPine-HCTZ 20-5-12.5 MG TABS Take 1 tablet by mouth daily.   prochlorperazine (COMPAZINE) 10 MG tablet Take 1 tablet (10 mg total) by mouth every 6 (six) hours as needed for nausea or vomiting.   rosuvastatin (CRESTOR) 20 MG tablet 1 tablet Orally Once a day for 90 days   senna-docusate (SENOKOT-S) 8.6-50 MG tablet Take 1 tablet by mouth at bedtime as needed for mild constipation.   traMADol (ULTRAM) 50 MG tablet Take 1 tablet (50 mg total) by mouth every 6 (six) hours as needed.   vitamin B-12 (CYANOCOBALAMIN) 500 MCG tablet Take 500 mcg by mouth in the morning.   Current Facility-Administered Medications for the 02/23/23 encounter (Office Visit) with Jamyria Ozanich, MD  Medication   0.9 %  sodium chloride infusion     Allergies:   Zithromax [azithromycin]   Social History   Socioeconomic History   Marital status: Married    Spouse name: Not on file   Number of children: Not on file   Years of education: Not on file   Highest education level: Not on file  Occupational History   Not on file  Tobacco Use   Smoking status: Former    Years: 20    Types: Cigarettes    Quit date: 10/01/1978    Years since quitting: 44.4   Smokeless tobacco: Never   Tobacco comments:    Former smoker 04/03/22  Substance and Sexual Activity   Alcohol use: Yes    Alcohol/week: 2.0 standard drinks of alcohol    Types: 2 Glasses of wine per week    Comment: occasionally   Drug use: No   Sexual activity: Not on file  Other Topics Concern   Not on file  Social History Narrative   Not on file   Social Determinants of Health   Financial Resource Strain: Not on file  Food Insecurity: Not on file  Transportation Needs: Not on file  Physical Activity: Not on file  Stress: Not on file  Social Connections: Not on file     Family History: The patient's family history includes  Cancer in his father. There is no history of Colon cancer, Esophageal cancer, Liver cancer, Pancreatic cancer, Rectal cancer, or Stomach cancer.  ROS:   Please see the history of present illness.     All other systems reviewed and are negative.  EKGs/Labs/Other Studies Reviewed:    The following studies were reviewed today: Notes from Dr. Leonides Schanz from yesterday  ECHO 12/17/2022   1. Left ventricular ejection fraction, by estimation, is 55 to 60%. Left  ventricular ejection fraction by 3D volume is 56 %. The left ventricle has  normal function. The left ventricle has no regional wall motion  abnormalities. Left ventricular diastolic   parameters were normal. The average left ventricular global longitudinal  strain is -25.3 %. The  global longitudinal strain is normal.   2. Right ventricular systolic function is normal. The right ventricular  size is normal. There is normal pulmonary artery systolic pressure. The  estimated right ventricular systolic pressure is 34.6 mmHg.   3. Left atrial size was mildly dilated.   4. Right atrial size was mildly dilated.   5. The mitral valve is normal in structure. Trivial mitral valve  regurgitation.   6. Tricuspid valve regurgitation is mild to moderate.   7. The aortic valve is tricuspid. There is mild calcification of the  aortic valve. There is mild thickening of the aortic valve. Aortic valve  regurgitation is trivial. Aortic valve sclerosis/calcification is present,  without any evidence of aortic  stenosis.   8. The inferior vena cava is normal in size with greater than 50%  respiratory variability, suggesting right atrial pressure of 3 mmHg.   Comparison(s): Prior images reviewed side by side. The left ventricular  function has improved.   EKG:  EKG is ordered today and looks similar to previous tracings.  It shows mild sinus bradycardia with first-degree AV block (PR interval 218 ms), left axis deviation almost meeting criteria for left  anterior fascicular block and nonspecific repolarization abnormalities.  Recent Labs: 03/12/2022: TSH 3.629 03/14/2022: Magnesium 2.3 02/24/2023: ALT 20; BUN 22; Creatinine 2.07; Hemoglobin 11.7; Platelet Count 210; Potassium 4.0; Sodium 140  Recent Lipid Panel    Component Value Date/Time   CHOL  08/31/2008 0532    160        ATP III CLASSIFICATION:  <200     mg/dL   Desirable  161-096  mg/dL   Borderline High  >=045    mg/dL   High   TRIG 49 40/98/1191 0532   HDL 48 08/31/2008 0532   CHOLHDL 3.3 08/31/2008 0532   VLDL 10 08/31/2008 0532   LDLCALC (H) 08/31/2008 0532    102        Total Cholesterol/HDL:CHD Risk Coronary Heart Disease Risk Table                     Men   Women  1/2 Average Risk   3.4   3.3     Risk Assessment/Calculations:    CHA2DS2-VASc Score = 3   This indicates a 3.2% annual risk of stroke. The patient's score is based upon: CHF History: 0 HTN History: 1 Diabetes History: 0 Stroke History: 0 Vascular Disease History: 0 Age Score: 2 Gender Score: 0           Physical Exam:    VS:  BP 138/82   Pulse (!) 55   Ht 6\' 6"  (1.981 m)   Wt 236 lb (107 kg)   SpO2 94%   BMI 27.27 kg/m     Wt Readings from Last 3 Encounters:  02/24/23 237 lb 6.4 oz (107.7 kg)  02/23/23 236 lb (107 kg)  02/09/23 235 lb 9.6 oz (106.9 kg)      General: Alert, oriented x3, no distress, appears well Head: no evidence of trauma, PERRL, EOMI, no exophtalmos or lid lag, no myxedema, no xanthelasma; normal ears, nose and oropharynx Neck: normal jugular venous pulsations and no hepatojugular reflux; brisk carotid pulses without delay and no carotid bruits Chest: clear to auscultation, no signs of consolidation by percussion or palpation, normal fremitus, symmetrical and full respiratory excursions Cardiovascular: normal position and quality of the apical impulse, regular rhythm, normal first and second heart sounds, no murmurs, rubs or gallops Abdomen: no  tenderness or  distention, no masses by palpation, no abnormal pulsatility or arterial bruits, normal bowel sounds, no hepatosplenomegaly Extremities: no clubbing, cyanosis or edema; 2+ radial, ulnar and brachial pulses bilaterally; 2+ right femoral, posterior tibial and dorsalis pedis pulses; 2+ left femoral, posterior tibial and dorsalis pedis pulses; no subclavian or femoral bruits Neurological: grossly nonfocal Psych: Normal mood and affect   ASSESSMENT:    1. Persistent atrial fibrillation (HCC)   2. Tachycardia induced cardiomyopathy (HCC)   3. Acquired thrombophilia (HCC)   4. Encounter for monitoring amiodarone therapy   5. Essential hypertension   6. Stage 3b chronic kidney disease (HCC)   7. Acquired hypothyroidism      PLAN:    In order of problems listed above:  AFib: Maintaining normal rhythm ever since we started on amiodarone.  On anticoagulation with Eliquis.  CHA2DS2-VASc 4 (age 22, HTN, history of heart failure).  Never had an embolic event that we are aware of.  Had tachycardia cardiomyopathy which has resolved.  Similarly mitral insufficiency resolved with treatment of the arrhythmia. Anticoagulation: Already without bleeding complications. Amiodarone: So far without any signs of complications.  Needs liver and thyroid function tests every 6 months, avoid excessive sun exposure, get a yearly ophthalmological exam, promptly report any unexplained respiratory symptoms, be aware of multiple potential drug interactions.  Normal liver function tests June 2024 Multiple myeloma on CyBorD chemotherapy: Doing very well on Velcade maintenance therapy. HTN: Adequate control.  Ideally blood pressure less than 130/80.  No changes made to his medications today. CKD 3B: Creatinine remains at baseline (1.7-2.0; GFR 35-40). Acquired hypothyroidism: Clinically he appears euthyroid.  TSH normal in July 2023.  Need to recheck TSH with next labs, which are checked once or twice a month every month in the  cancer center or check it when he goes to the [place].     Medication Adjustments/Labs and Tests Ordered: Current medicines are reviewed at length with the patient today.  Concerns regarding medicines are outlined above.  Orders Placed This Encounter  Procedures   EKG 12-Lead   No orders of the defined types were placed in this encounter.   Patient Instructions  Medication Instructions:  No changes *If you need a refill on your cardiac medications before your next appointment, please call your pharmacy*  Follow-Up: At [facility], you and your health needs are our priority.  As part of our continuing mission to provide you with exceptional heart care, we have created designated Provider Care Teams.  These Care Teams include your primary Cardiologist (physician) and Advanced Practice Providers (APPs -  Physician Assistants and Nurse Practitioners) who all work together to provide you with the care you need, when you need it.  We recommend signing up for the patient portal called "MyChart".  Sign up information is provided on this After Visit Summary.  MyChart is used to connect with patients for Virtual Visits (Telemedicine).  Patients are able to view lab/test results, encounter notes, upcoming appointments, etc.  Non-urgent messages can be sent to your provider as well.   To learn more about what you can do with MyChart, go to [email or web address].    Your next appointment:   1 year(s)  Provider:   Dr Royann Shivers     Signed, Darren Fair, MD  03/01/2023 3:57 PM    [facility]

## 2023-02-23 NOTE — Patient Instructions (Signed)
Medication Instructions:  No changes *If you need a refill on your cardiac medications before your next appointment, please call your pharmacy*  Follow-Up: At [facility], you and your health needs are our priority.  As part of our continuing mission to provide you with exceptional heart care, we have created designated Provider Care Teams.  These Care Teams include your primary Cardiologist (physician) and Advanced Practice Providers (APPs -  Physician Assistants and Nurse Practitioners) who all work together to provide you with the care you need, when you need it.  We recommend signing up for the patient portal called "MyChart".  Sign up information is provided on this After Visit Summary.  MyChart is used to connect with patients for Virtual Visits (Telemedicine).  Patients are able to view lab/test results, encounter notes, upcoming appointments, etc.  Non-urgent messages can be sent to your provider as well.   To learn more about what you can do with MyChart, go to [email or web address].    Your next appointment:   1 year(s)  Provider:   Dr Croitoru  

## 2023-02-24 ENCOUNTER — Other Ambulatory Visit: Payer: Self-pay

## 2023-02-24 ENCOUNTER — Inpatient Hospital Stay: Payer: No Typology Code available for payment source

## 2023-02-24 ENCOUNTER — Inpatient Hospital Stay (HOSPITAL_BASED_OUTPATIENT_CLINIC_OR_DEPARTMENT_OTHER): Payer: No Typology Code available for payment source | Admitting: Hematology and Oncology

## 2023-02-24 DIAGNOSIS — M79606 Pain in leg, unspecified: Secondary | ICD-10-CM | POA: Diagnosis not present

## 2023-02-24 DIAGNOSIS — Z87891 Personal history of nicotine dependence: Secondary | ICD-10-CM

## 2023-02-24 DIAGNOSIS — C9000 Multiple myeloma not having achieved remission: Secondary | ICD-10-CM

## 2023-02-24 DIAGNOSIS — M7989 Other specified soft tissue disorders: Secondary | ICD-10-CM | POA: Diagnosis not present

## 2023-02-24 DIAGNOSIS — Z5112 Encounter for antineoplastic immunotherapy: Secondary | ICD-10-CM | POA: Diagnosis not present

## 2023-02-24 DIAGNOSIS — Z809 Family history of malignant neoplasm, unspecified: Secondary | ICD-10-CM

## 2023-02-24 LAB — CBC WITH DIFFERENTIAL (CANCER CENTER ONLY)
Abs Immature Granulocytes: 0.02 10*3/uL (ref 0.00–0.07)
Basophils Absolute: 0 10*3/uL (ref 0.0–0.1)
Basophils Relative: 1 %
Eosinophils Absolute: 0.2 10*3/uL (ref 0.0–0.5)
Eosinophils Relative: 6 %
HCT: 35.5 % — ABNORMAL LOW (ref 39.0–52.0)
Hemoglobin: 11.7 g/dL — ABNORMAL LOW (ref 13.0–17.0)
Immature Granulocytes: 1 %
Lymphocytes Relative: 21 %
Lymphs Abs: 0.9 10*3/uL (ref 0.7–4.0)
MCH: 34.2 pg — ABNORMAL HIGH (ref 26.0–34.0)
MCHC: 33 g/dL (ref 30.0–36.0)
MCV: 103.8 fL — ABNORMAL HIGH (ref 80.0–100.0)
Monocytes Absolute: 0.5 10*3/uL (ref 0.1–1.0)
Monocytes Relative: 12 %
Neutro Abs: 2.6 10*3/uL (ref 1.7–7.7)
Neutrophils Relative %: 59 %
Platelet Count: 210 10*3/uL (ref 150–400)
RBC: 3.42 MIL/uL — ABNORMAL LOW (ref 4.22–5.81)
RDW: 14.7 % (ref 11.5–15.5)
WBC Count: 4.2 10*3/uL (ref 4.0–10.5)
nRBC: 0 % (ref 0.0–0.2)

## 2023-02-24 LAB — CMP (CANCER CENTER ONLY)
ALT: 20 U/L (ref 0–44)
AST: 22 U/L (ref 15–41)
Albumin: 4.3 g/dL (ref 3.5–5.0)
Alkaline Phosphatase: 64 U/L (ref 38–126)
Anion gap: 8 (ref 5–15)
BUN: 22 mg/dL (ref 8–23)
CO2: 25 mmol/L (ref 22–32)
Calcium: 9.3 mg/dL (ref 8.9–10.3)
Chloride: 107 mmol/L (ref 98–111)
Creatinine: 2.07 mg/dL — ABNORMAL HIGH (ref 0.61–1.24)
GFR, Estimated: 32 mL/min — ABNORMAL LOW (ref >60)
Glucose, Bld: 117 mg/dL — ABNORMAL HIGH (ref 70–99)
Potassium: 4 mmol/L (ref 3.5–5.1)
Sodium: 140 mmol/L (ref 135–145)
Total Bilirubin: 0.5 mg/dL (ref 0.3–1.2)
Total Protein: 6.4 g/dL — ABNORMAL LOW (ref 6.5–8.1)

## 2023-02-24 MED ORDER — BORTEZOMIB CHEMO SQ INJECTION 3.5 MG (2.5MG/ML)
1.3000 mg/m2 | Freq: Once | INTRAMUSCULAR | Status: AC
  Administered 2023-02-24: 3 mg via SUBCUTANEOUS
  Filled 2023-02-24: qty 1.2

## 2023-02-24 MED ORDER — PROCHLORPERAZINE MALEATE 10 MG PO TABS
10.0000 mg | ORAL_TABLET | Freq: Once | ORAL | Status: AC
  Administered 2023-02-24: 10 mg via ORAL
  Filled 2023-02-24: qty 1

## 2023-02-24 NOTE — Patient Instructions (Signed)
[facility]  Discharge Instructions: Thank you for choosing [facility] to provide your oncology and hematology care.   If you have a lab appointment with the Cancer Center, please go directly to the Cancer Center and check in at the registration area.   Wear comfortable clothing and clothing appropriate for easy access to any Portacath or PICC line.   We strive to give you quality time with your provider. You may need to reschedule your appointment if you arrive late (15 or more minutes).  Arriving late affects you and other patients whose appointments are after yours.  Also, if you miss three or more appointments without notifying the office, you may be dismissed from the clinic at the provider's discretion.      For prescription refill requests, have your pharmacy contact our office and allow 72 hours for refills to be completed.    Today you received the following chemotherapy and/or immunotherapy agents: Velcade     To help prevent nausea and vomiting after your treatment, we encourage you to take your nausea medication as directed.  BELOW ARE SYMPTOMS THAT SHOULD BE REPORTED IMMEDIATELY: *FEVER GREATER THAN 100.4 F (38 C) OR HIGHER *CHILLS OR SWEATING *NAUSEA AND VOMITING THAT IS NOT CONTROLLED WITH YOUR NAUSEA MEDICATION *UNUSUAL SHORTNESS OF BREATH *UNUSUAL BRUISING OR BLEEDING *URINARY PROBLEMS (pain or burning when urinating, or frequent urination) *BOWEL PROBLEMS (unusual diarrhea, constipation, pain near the anus) TENDERNESS IN MOUTH AND THROAT WITH OR WITHOUT PRESENCE OF ULCERS (sore throat, sores in mouth, or a toothache) UNUSUAL RASH, SWELLING OR PAIN  UNUSUAL VAGINAL DISCHARGE OR ITCHING   Items with * indicate a potential emergency and should be followed up as soon as possible or go to the Emergency Department if any problems should occur.  Please show the CHEMOTHERAPY ALERT CARD or IMMUNOTHERAPY ALERT CARD at check-in  to the Emergency Department and triage nurse.  Should you have questions after your visit or need to cancel or reschedule your appointment, please contact [facility]  Dept: [phone]  and follow the prompts.  Office hours are 8:00 a.m. to 4:30 p.m. Monday - Friday. Please note that voicemails left after 4:00 p.m. may not be returned until the following business day.  We are closed weekends and major holidays. You have access to a nurse at all times for urgent questions. Please call the main number to the clinic Dept: [phone] and follow the prompts.   For any non-urgent questions, you may also contact your provider using MyChart. We now offer e-Visits for anyone 18 and older to request care online for non-urgent symptoms. For details visit [email or web address].   Also download the MyChart app! Go to the app store, search "MyChart", open the app, select [facility], and log in with your MyChart username and password.   

## 2023-02-24 NOTE — Progress Notes (Signed)
[facility] [phone]   [phone]  PROGRESS NOTE  Patient Care Team: [name], MD as PCP - General (Internal Medicine)  Hematological/Oncological History # IgA Lambda Multiple Myeloma 05/03/2015: last visit with Dr. Arbutus Ped at the [facility]. Was followed for IgA lambda MGUS. 12/11/2021: labs show M protein 3.4, Kappa 19.2, Lambda 1576.4, ratio 0.01. Cr 3.47, Hgb 8.6, WBC 5.7, MCV 99, Plt 221 12/23/2021: establish care with Dr. Leonides Schanz  01/16/2022: Bone marrow biopsy performed, showed a 60% cellular bone marrow predominantly comprised of plasma cells making up 70 to 80%, lambda restricted.  Myeloma FISH panel showed no evidence of abnormalities. 02/07/2022: Cycle 1 Day 1 of CyBorD chemotherapy.  03/09/2022-03/17/2022: hospitalized for fever/pneumonia.  03/24/2022: Cycle 2 Day 1 of CyBorD chemotherapy.  04/22/2022: Cycle 3 Day 1 of CyBorD chemotherapy 05/19/2022-05/26/2022: Delayed start of Cycle 4 Day 1 of CyBorD chemotherapy due to neutropenia.  06/03/2022: Cycle 4 Day 1 of CyBorD chemotherapy. 25% dose reduction of cyclophosphamide due to cytopenias. 06/30/2022: Cycle 4 Day 22. Drop Velcade dose to 1mg /m2 and Cyclophosphamide 200 mg/m2  07/07/2022: Cycle 5 Day 1 of CyBorD chemotherapy with dose reductions.  08/04/2022: Cycle 6 Day 1 of CyBorD chemotherapy with dose reductions.  09/09/2022: transition to maintenance dose Velcade q 2 weeks.   Interval History:  Darren Hinton. 77 y.o. male with medical history significant for IgA lambda multiple myeloma who presents for a follow up visit. The patient's last visit was on 01/27/2023. In the interim since the last visit he has continued on maintenance Velcade.   On exam today Darren Hinton reports that he had a great Father's Day.  His son surprised him at church and he had a nice dinner with his grandchildren.  He reports he is tolerating the every 2 week shots quite well.  He is not having any major side  effects.  He is having some numbness and tingling in his toes but no pain.  He reports his balance is "a little off".  He notes he is try to do some more walking but "not too much".  He notes that he is not having any trouble with nausea, vomiting, or diarrhea.  Overall he is willing and able to proceed with continued Velcade maintenance treatments at this time.   Overall he is willing and able to proceed with treatment at this time.  He denies fevers, chills, sweats, shortness of breath, chest pain or cough. He has no other complaints. Rest of the 10 point ROS is below.   MEDICAL HISTORY:  Past Medical History:  Diagnosis Date   Allergy    Anxiety    Arthritis    Depression    Heart murmur    Hyperlipidemia    Hyperplastic colon polyp    Hyperproteinemia    Hypertension    Hypothyroidism    Mitral valve prolapse    Paroxysmal atrial fibrillation (HCC)    Thyroid disease     SURGICAL HISTORY: Past Surgical History:  Procedure Laterality Date   CARDIOVERSION N/A 03/14/2022   Procedure: CARDIOVERSION;  Surgeon: Chrystie Nose, MD;  Location: [facility] ENDOSCOPY;  Service: Cardiovascular;  Laterality: N/A;   TEE WITHOUT CARDIOVERSION N/A 03/14/2022   Procedure: TRANSESOPHAGEAL ECHOCARDIOGRAM (TEE);  Surgeon: Chrystie Nose, MD;  Location: [facility] ENDOSCOPY;  Service: Cardiovascular;  Laterality: N/A;    SOCIAL HISTORY: Social History   Socioeconomic History   Marital status: Married    Spouse name: Not on file   Number  of children: Not on file   Years of education: Not on file   Highest education level: Not on file  Occupational History   Not on file  Tobacco Use   Smoking status: Former    Years: 20    Types: Cigarettes    Quit date: 10/01/1978    Years since quitting: 44.4   Smokeless tobacco: Never   Tobacco comments:    Former smoker 04/03/22  Substance and Sexual Activity   Alcohol use: Yes    Alcohol/week: 2.0 standard drinks of alcohol    Types: 2 Glasses of wine per week     Comment: occasionally   Drug use: No   Sexual activity: Not on file  Other Topics Concern   Not on file  Social History Narrative   Not on file   Social Determinants of Health   Financial Resource Strain: Not on file  Food Insecurity: Not on file  Transportation Needs: Not on file  Physical Activity: Not on file  Stress: Not on file  Social Connections: Not on file  Intimate Partner Violence: Not on file    FAMILY HISTORY: Family History  Problem Relation Age of Onset   Cancer Father    Colon cancer Neg Hx    Esophageal cancer Neg Hx    Liver cancer Neg Hx    Pancreatic cancer Neg Hx    Rectal cancer Neg Hx    Stomach cancer Neg Hx     ALLERGIES:  is allergic to zithromax [azithromycin].  MEDICATIONS:  Current Outpatient Medications  Medication Sig Dispense Refill   acyclovir (ZOVIRAX) 400 MG tablet TAKE 1 TABLET BY MOUTH TWICE A DAY 180 tablet 1   ALPRAZolam (XANAX) 0.25 MG tablet Take 0.25 mg by mouth 2 (two) times daily as needed for anxiety.     amiodarone (PACERONE) 200 MG tablet TAKE 1 TABLET BY MOUTH EVERY DAY 90 tablet 3   amLODipine (NORVASC) 5 MG tablet TAKE 1 TABLET (5 MG TOTAL) BY MOUTH DAILY. 90 tablet 3   apixaban (ELIQUIS) 5 MG TABS tablet Take 1 tablet (5 mg total) by mouth 2 (two) times daily. 180 tablet 1   dexamethasone (DECADRON) 4 MG tablet TAKE 10 TABLETS (40 MG TOTAL) BY MOUTH ONCE A WEEK IN THE MORNING ON CHEMOTHERAPY DAYS 120 tablet 1   ergocalciferol (VITAMIN D2) 1.25 MG (50000 UT) capsule Take 50,000 Units by mouth every Wednesday.     ferrous gluconate (FERGON) 324 MG tablet Take 1 tablet (324 mg total) by mouth 3 (three) times daily with meals. 90 tablet 0   fluticasone (FLONASE) 50 MCG/ACT nasal spray Place 2 sprays into the nose daily as needed for allergies.     gabapentin (NEURONTIN) 300 MG capsule Take 1 capsule (300 mg total) by mouth 2 (two) times daily. 60 capsule 1   levofloxacin (LEVAQUIN) 500 MG tablet Take 1 tablet (500 mg  total) by mouth daily. 7 tablet 0   levothyroxine (SYNTHROID) 100 MCG tablet Take 100 mcg by mouth See admin instructions. Take 1 tablet (100 mcg) by mouth on Mondays, Tuesdays, Wednesdays, Thursdays, Fridays & Saturdays in the morning. Skip a dose on Sundays.     loratadine (CLARITIN) 10 MG tablet Take 10 mg by mouth daily as needed for allergies.     meclizine (ANTIVERT) 25 MG tablet Take 1 tablet (25 mg total) by mouth 3 (three) times daily as needed for dizziness. 30 tablet 0   metoprolol succinate (TOPROL-XL) 25 MG 24 hr tablet Take  1 tablet (25 mg total) by mouth daily. Take with or immediately following a meal. 30 tablet 3   Olmesartan-amLODIPine-HCTZ 20-5-12.5 MG TABS Take 1 tablet by mouth daily.     prochlorperazine (COMPAZINE) 10 MG tablet Take 1 tablet (10 mg total) by mouth every 6 (six) hours as needed for nausea or vomiting. 30 tablet 0   rosuvastatin (CRESTOR) 20 MG tablet 1 tablet Orally Once a day for 90 days     senna-docusate (SENOKOT-S) 8.6-50 MG tablet Take 1 tablet by mouth at bedtime as needed for mild constipation.     traMADol (ULTRAM) 50 MG tablet Take 1 tablet (50 mg total) by mouth every 6 (six) hours as needed. 30 tablet 0   vitamin B-12 (CYANOCOBALAMIN) 500 MCG tablet Take 500 mcg by mouth in the morning.     Current Facility-Administered Medications  Medication Dose Route Frequency Provider Last Rate Last Admin   0.9 %  sodium chloride infusion  500 mL Intravenous Once Armbruster, Willaim Rayas, MD        REVIEW OF SYSTEMS:   Constitutional: ( - ) fevers, ( - )  chills , ( - ) night sweats Eyes: ( - ) blurriness of vision, ( - ) double vision, ( - ) watery eyes Ears, nose, mouth, throat, and face: ( - ) mucositis, ( - ) sore throat Respiratory: ( - ) cough, ( - ) dyspnea, ( - ) wheezes Cardiovascular: ( - ) palpitation, ( - ) chest discomfort, (+ ) lower extremity swelling Gastrointestinal:  ( - ) nausea, ( - ) heartburn, ( - ) change in bowel habits Skin: ( - )  abnormal skin rashes Lymphatics: ( - ) new lymphadenopathy, ( - ) easy bruising Neurological: ( +- ) numbness, ( - ) tingling, ( - ) new weaknesses Behavioral/Psych: ( - ) mood change, ( - ) new changes  All other systems were reviewed with the patient and are negative.  PHYSICAL EXAMINATION: ECOG PERFORMANCE STATUS: 1 - Symptomatic but completely ambulatory  Vitals:   02/24/23 1138  BP: 137/73  Pulse: (!) 55  Resp: 15  Temp: (!) 97.2 F (36.2 C)  SpO2: 98%   Filed Weights   02/24/23 1138  Weight: 237 lb 6.4 oz (107.7 kg)    GENERAL: Chronically ill-appearing elderly African-American male, alert, no distress and comfortable SKIN: skin color, texture, turgor are normal, no rashes or significant lesions EYES: conjunctiva are pink and non-injected, sclera clear LUNGS: clear to auscultation and percussion with normal breathing effort HEART: regular rate & rhythm and no murmurs and + 2 bilateral ankle edema Musculoskeletal: no cyanosis of digits and no clubbing  PSYCH: alert & oriented x 3, fluent speech NEURO: no focal motor/sensory deficits  LABORATORY DATA:  I have reviewed the data as listed    Latest Ref Rng & Units 02/24/2023   11:04 AM 02/09/2023    7:35 AM 01/27/2023    8:14 AM  CBC  WBC 4.0 - 10.5 K/uL 4.2  4.4  4.5   Hemoglobin 13.0 - 17.0 g/dL 16.1  09.6  04.5   Hematocrit 39.0 - 52.0 % 35.5  37.0  36.8   Platelets 150 - 400 K/uL 210  193  199        Latest Ref Rng & Units 02/24/2023   11:04 AM 02/09/2023    7:35 AM 01/27/2023    8:14 AM  CMP  Glucose 70 - 99 mg/dL 409  99  811   BUN 8 -  23 mg/dL 22  17  15    Creatinine 0.61 - 1.24 mg/dL 5.40  9.81  1.91   Sodium 135 - 145 mmol/L 140  141  141   Potassium 3.5 - 5.1 mmol/L 4.0  4.3  4.2   Chloride 98 - 111 mmol/L 107  108  108   CO2 22 - 32 mmol/L 25  25  26    Calcium 8.9 - 10.3 mg/dL 9.3  9.2  9.1   Total Protein 6.5 - 8.1 g/dL 6.4  6.8  6.7   Total Bilirubin 0.3 - 1.2 mg/dL 0.5  0.5  0.5   Alkaline Phos 38  - 126 U/L 64  56  59   AST 15 - 41 U/L 22  21  23    ALT 0 - 44 U/L 20  18  19      Lab Results  Component Value Date   MPROTEIN Not Observed 02/09/2023   MPROTEIN Not Observed 01/12/2023   MPROTEIN Not Observed 12/30/2022   Lab Results  Component Value Date   KPAFRELGTCHN 13.8 02/09/2023   KPAFRELGTCHN 15.0 01/12/2023   KPAFRELGTCHN 15.7 12/30/2022   LAMBDASER 12.9 02/09/2023   LAMBDASER 12.4 01/12/2023   LAMBDASER 12.3 12/30/2022   KAPLAMBRATIO 1.07 02/09/2023   KAPLAMBRATIO 1.21 01/12/2023   KAPLAMBRATIO 1.28 12/30/2022    RADIOGRAPHIC STUDIES: No results found.  ASSESSMENT & PLAN Darren Hinton. Is a 77 y.o. male with medical history significant for IgA lambda multiple myeloma who presents for a follow up visit. .    # IgA lambda Multiple Myeloma -- Bone marrow biopsy performed on 01/16/2022 showed increased plasma cells consistent with a plasma cell neoplasm.  Normal FISH panel. --Patient meets diagnostic criteria based on kidney dysfunction and anemia. --Cycle 1 Day 1 of CyBorD chemotherapy started on 02/07/2022 --Velcade maintenance therapy started on 09/09/2022.  Plan: --Most recent SPEP from 02/09/2023 showed M protein not detected. , kappa 13.8, lambda 12.9, ratio 1.07 --Labs today show white blood cell 4.2, hemoglobin 9.7, MCV 103.8, and platelets of 210. Calcium level is normal. LFTs normal.  --continue on maintenance Velcade every 2 weeks with monthly clinic visits.  # Leg Pain -- Likely a component of worsening neuropathy from his chemotherapy with pre-existing pain. -- continue gabapentin to 300 mg twice daily -- continue tramadol 50 mg every 6 hours as needed -- patient connected with Dr. Barbaraann Cao  #Supportive Care -- chemotherapy education complete -- port placement not required.  -- zofran 8mg  q8H PRN and compazine 10mg  PO q6H for nausea -- acyclovir 400mg  PO BID for VCZ prophylaxis --zometa to start after dental clearance.   No orders of the defined  types were placed in this encounter.  All questions were answered. The patient knows to call the clinic with any problems, questions or concerns.  I have spent a total of 30 minutes minutes of face-to-face and non-face-to-face time, preparing to see the patient, performing a medically appropriate examination, counseling and educating the patient, documenting clinical information in the electronic health record,  and care coordination.   Darren Barns, MD Department of Hematology/Oncology [facility] Phone: [phone] Pager: [phone] Email: [email or web address]  02/24/2023 4:17 PM

## 2023-02-25 LAB — KAPPA/LAMBDA LIGHT CHAINS
Kappa free light chain: 14.3 mg/L (ref 3.3–19.4)
Kappa, lambda light chain ratio: 1.06 (ref 0.26–1.65)
Lambda free light chains: 13.5 mg/L (ref 5.7–26.3)

## 2023-02-28 LAB — MULTIPLE MYELOMA PANEL, SERUM
Albumin SerPl Elph-Mcnc: 3.9 g/dL (ref 2.9–4.4)
Albumin/Glob SerPl: 1.6 (ref 0.7–1.7)
Alpha 1: 0.3 g/dL (ref 0.0–0.4)
Alpha2 Glob SerPl Elph-Mcnc: 0.8 g/dL (ref 0.4–1.0)
B-Globulin SerPl Elph-Mcnc: 0.9 g/dL (ref 0.7–1.3)
Gamma Glob SerPl Elph-Mcnc: 0.5 g/dL (ref 0.4–1.8)
Globulin, Total: 2.5 g/dL (ref 2.2–3.9)
IgA: 30 mg/dL — ABNORMAL LOW (ref 61–437)
IgG (Immunoglobin G), Serum: 551 mg/dL — ABNORMAL LOW (ref 603–1613)
IgM (Immunoglobulin M), Srm: 35 mg/dL (ref 15–143)
Total Protein ELP: 6.4 g/dL (ref 6.0–8.5)

## 2023-03-07 ENCOUNTER — Other Ambulatory Visit: Payer: Self-pay

## 2023-03-09 ENCOUNTER — Other Ambulatory Visit: Payer: Self-pay

## 2023-03-09 ENCOUNTER — Inpatient Hospital Stay: Payer: No Typology Code available for payment source

## 2023-03-09 ENCOUNTER — Inpatient Hospital Stay: Payer: No Typology Code available for payment source | Attending: Hematology and Oncology

## 2023-03-09 VITALS — BP 142/64 | HR 57 | Temp 97.7°F | Resp 18 | Wt 241.5 lb

## 2023-03-09 DIAGNOSIS — G629 Polyneuropathy, unspecified: Secondary | ICD-10-CM | POA: Insufficient documentation

## 2023-03-09 DIAGNOSIS — C9000 Multiple myeloma not having achieved remission: Secondary | ICD-10-CM | POA: Insufficient documentation

## 2023-03-09 DIAGNOSIS — Z8719 Personal history of other diseases of the digestive system: Secondary | ICD-10-CM | POA: Diagnosis not present

## 2023-03-09 DIAGNOSIS — Z881 Allergy status to other antibiotic agents status: Secondary | ICD-10-CM | POA: Diagnosis not present

## 2023-03-09 DIAGNOSIS — I480 Paroxysmal atrial fibrillation: Secondary | ICD-10-CM | POA: Diagnosis not present

## 2023-03-09 DIAGNOSIS — Z87891 Personal history of nicotine dependence: Secondary | ICD-10-CM | POA: Insufficient documentation

## 2023-03-09 DIAGNOSIS — Z7901 Long term (current) use of anticoagulants: Secondary | ICD-10-CM | POA: Insufficient documentation

## 2023-03-09 DIAGNOSIS — Z5112 Encounter for antineoplastic immunotherapy: Secondary | ICD-10-CM | POA: Diagnosis present

## 2023-03-09 DIAGNOSIS — R195 Other fecal abnormalities: Secondary | ICD-10-CM | POA: Insufficient documentation

## 2023-03-09 DIAGNOSIS — Z79899 Other long term (current) drug therapy: Secondary | ICD-10-CM | POA: Insufficient documentation

## 2023-03-09 DIAGNOSIS — Z7969 Long term (current) use of other immunomodulators and immunosuppressants: Secondary | ICD-10-CM | POA: Diagnosis not present

## 2023-03-09 LAB — CMP (CANCER CENTER ONLY)
ALT: 23 U/L (ref 0–44)
AST: 24 U/L (ref 15–41)
Albumin: 4.2 g/dL (ref 3.5–5.0)
Alkaline Phosphatase: 53 U/L (ref 38–126)
Anion gap: 7 (ref 5–15)
BUN: 20 mg/dL (ref 8–23)
CO2: 27 mmol/L (ref 22–32)
Calcium: 8.8 mg/dL — ABNORMAL LOW (ref 8.9–10.3)
Chloride: 108 mmol/L (ref 98–111)
Creatinine: 2.27 mg/dL — ABNORMAL HIGH (ref 0.61–1.24)
GFR, Estimated: 29 mL/min — ABNORMAL LOW (ref >60)
Glucose, Bld: 92 mg/dL (ref 70–99)
Potassium: 4.2 mmol/L (ref 3.5–5.1)
Sodium: 142 mmol/L (ref 135–145)
Total Bilirubin: 0.5 mg/dL (ref 0.3–1.2)
Total Protein: 6.8 g/dL (ref 6.5–8.1)

## 2023-03-09 LAB — CBC WITH DIFFERENTIAL (CANCER CENTER ONLY)
Abs Immature Granulocytes: 0.01 10*3/uL (ref 0.00–0.07)
Basophils Absolute: 0 10*3/uL (ref 0.0–0.1)
Basophils Relative: 1 %
Eosinophils Absolute: 0.3 10*3/uL (ref 0.0–0.5)
Eosinophils Relative: 7 %
HCT: 34.6 % — ABNORMAL LOW (ref 39.0–52.0)
Hemoglobin: 11 g/dL — ABNORMAL LOW (ref 13.0–17.0)
Immature Granulocytes: 0 %
Lymphocytes Relative: 18 %
Lymphs Abs: 0.9 10*3/uL (ref 0.7–4.0)
MCH: 33.1 pg (ref 26.0–34.0)
MCHC: 31.8 g/dL (ref 30.0–36.0)
MCV: 104.2 fL — ABNORMAL HIGH (ref 80.0–100.0)
Monocytes Absolute: 0.8 10*3/uL (ref 0.1–1.0)
Monocytes Relative: 16 %
Neutro Abs: 3 10*3/uL (ref 1.7–7.7)
Neutrophils Relative %: 58 %
Platelet Count: 174 10*3/uL (ref 150–400)
RBC: 3.32 MIL/uL — ABNORMAL LOW (ref 4.22–5.81)
RDW: 14.9 % (ref 11.5–15.5)
WBC Count: 5.1 10*3/uL (ref 4.0–10.5)
nRBC: 0 % (ref 0.0–0.2)

## 2023-03-09 MED ORDER — PROCHLORPERAZINE MALEATE 10 MG PO TABS
10.0000 mg | ORAL_TABLET | Freq: Once | ORAL | Status: AC
  Administered 2023-03-09: 10 mg via ORAL
  Filled 2023-03-09: qty 1

## 2023-03-09 MED ORDER — BORTEZOMIB CHEMO SQ INJECTION 3.5 MG (2.5MG/ML)
1.3000 mg/m2 | Freq: Once | INTRAMUSCULAR | Status: AC
  Administered 2023-03-09: 3 mg via SUBCUTANEOUS
  Filled 2023-03-09: qty 1.2

## 2023-03-09 NOTE — Patient Instructions (Signed)
[facility]  Discharge Instructions: Thank you for choosing [facility] to provide your oncology and hematology care.   If you have a lab appointment with the Cancer Center, please go directly to the Cancer Center and check in at the registration area.   Wear comfortable clothing and clothing appropriate for easy access to any Portacath or PICC line.   We strive to give you quality time with your provider. You may need to reschedule your appointment if you arrive late (15 or more minutes).  Arriving late affects you and other patients whose appointments are after yours.  Also, if you miss three or more appointments without notifying the office, you may be dismissed from the clinic at the provider's discretion.      For prescription refill requests, have your pharmacy contact our office and allow 72 hours for refills to be completed.    Today you received the following chemotherapy and/or immunotherapy agents: Velcade     To help prevent nausea and vomiting after your treatment, we encourage you to take your nausea medication as directed.  BELOW ARE SYMPTOMS THAT SHOULD BE REPORTED IMMEDIATELY: *FEVER GREATER THAN 100.4 F (38 C) OR HIGHER *CHILLS OR SWEATING *NAUSEA AND VOMITING THAT IS NOT CONTROLLED WITH YOUR NAUSEA MEDICATION *UNUSUAL SHORTNESS OF BREATH *UNUSUAL BRUISING OR BLEEDING *URINARY PROBLEMS (pain or burning when urinating, or frequent urination) *BOWEL PROBLEMS (unusual diarrhea, constipation, pain near the anus) TENDERNESS IN MOUTH AND THROAT WITH OR WITHOUT PRESENCE OF ULCERS (sore throat, sores in mouth, or a toothache) UNUSUAL RASH, SWELLING OR PAIN  UNUSUAL VAGINAL DISCHARGE OR ITCHING   Items with * indicate a potential emergency and should be followed up as soon as possible or go to the Emergency Department if any problems should occur.  Please show the CHEMOTHERAPY ALERT CARD or IMMUNOTHERAPY ALERT CARD at check-in  to the Emergency Department and triage nurse.  Should you have questions after your visit or need to cancel or reschedule your appointment, please contact [facility]  Dept: [phone]  and follow the prompts.  Office hours are 8:00 a.m. to 4:30 p.m. Monday - Friday. Please note that voicemails left after 4:00 p.m. may not be returned until the following business day.  We are closed weekends and major holidays. You have access to a nurse at all times for urgent questions. Please call the main number to the clinic Dept: [phone] and follow the prompts.   For any non-urgent questions, you may also contact your provider using MyChart. We now offer e-Visits for anyone 18 and older to request care online for non-urgent symptoms. For details visit [email or web address].   Also download the MyChart app! Go to the app store, search "MyChart", open the app, select [place], and log in with your MyChart username and password.   

## 2023-03-09 NOTE — Progress Notes (Signed)
Okay to treat with creat of 2.27 per Dr. Leonides Schanz

## 2023-03-10 LAB — KAPPA/LAMBDA LIGHT CHAINS
Kappa free light chain: 15.4 mg/L (ref 3.3–19.4)
Kappa, lambda light chain ratio: 1.17 (ref 0.26–1.65)
Lambda free light chains: 13.2 mg/L (ref 5.7–26.3)

## 2023-03-14 LAB — MULTIPLE MYELOMA PANEL, SERUM
Albumin SerPl Elph-Mcnc: 3.8 g/dL (ref 2.9–4.4)
Albumin/Glob SerPl: 1.7 (ref 0.7–1.7)
Alpha 1: 0.2 g/dL (ref 0.0–0.4)
Alpha2 Glob SerPl Elph-Mcnc: 0.7 g/dL (ref 0.4–1.0)
B-Globulin SerPl Elph-Mcnc: 0.9 g/dL (ref 0.7–1.3)
Gamma Glob SerPl Elph-Mcnc: 0.5 g/dL (ref 0.4–1.8)
Globulin, Total: 2.3 g/dL (ref 2.2–3.9)
IgA: 32 mg/dL — ABNORMAL LOW (ref 61–437)
IgG (Immunoglobin G), Serum: 488 mg/dL — ABNORMAL LOW (ref 603–1613)
IgM (Immunoglobulin M), Srm: 38 mg/dL (ref 15–143)
Total Protein ELP: 6.1 g/dL (ref 6.0–8.5)

## 2023-03-16 ENCOUNTER — Other Ambulatory Visit: Payer: Self-pay

## 2023-03-20 ENCOUNTER — Other Ambulatory Visit: Payer: Self-pay

## 2023-03-24 ENCOUNTER — Inpatient Hospital Stay: Payer: No Typology Code available for payment source

## 2023-03-24 ENCOUNTER — Other Ambulatory Visit: Payer: Self-pay

## 2023-03-24 ENCOUNTER — Inpatient Hospital Stay (HOSPITAL_BASED_OUTPATIENT_CLINIC_OR_DEPARTMENT_OTHER): Payer: No Typology Code available for payment source | Admitting: Physician Assistant

## 2023-03-24 DIAGNOSIS — C9000 Multiple myeloma not having achieved remission: Secondary | ICD-10-CM

## 2023-03-24 DIAGNOSIS — Z5112 Encounter for antineoplastic immunotherapy: Secondary | ICD-10-CM | POA: Diagnosis not present

## 2023-03-24 LAB — CBC WITH DIFFERENTIAL (CANCER CENTER ONLY)
Abs Immature Granulocytes: 0.01 10*3/uL (ref 0.00–0.07)
Basophils Absolute: 0 10*3/uL (ref 0.0–0.1)
Basophils Relative: 1 %
Eosinophils Absolute: 0.2 10*3/uL (ref 0.0–0.5)
Eosinophils Relative: 5 %
HCT: 34.3 % — ABNORMAL LOW (ref 39.0–52.0)
Hemoglobin: 11 g/dL — ABNORMAL LOW (ref 13.0–17.0)
Immature Granulocytes: 0 %
Lymphocytes Relative: 23 %
Lymphs Abs: 1.1 10*3/uL (ref 0.7–4.0)
MCH: 33 pg (ref 26.0–34.0)
MCHC: 32.1 g/dL (ref 30.0–36.0)
MCV: 103 fL — ABNORMAL HIGH (ref 80.0–100.0)
Monocytes Absolute: 0.7 10*3/uL (ref 0.1–1.0)
Monocytes Relative: 14 %
Neutro Abs: 2.7 10*3/uL (ref 1.7–7.7)
Neutrophils Relative %: 57 %
Platelet Count: 220 10*3/uL (ref 150–400)
RBC: 3.33 MIL/uL — ABNORMAL LOW (ref 4.22–5.81)
RDW: 15.1 % (ref 11.5–15.5)
WBC Count: 4.7 10*3/uL (ref 4.0–10.5)
nRBC: 0 % (ref 0.0–0.2)

## 2023-03-24 LAB — CMP (CANCER CENTER ONLY)
ALT: 19 U/L (ref 0–44)
AST: 22 U/L (ref 15–41)
Albumin: 4.4 g/dL (ref 3.5–5.0)
Alkaline Phosphatase: 57 U/L (ref 38–126)
Anion gap: 7 (ref 5–15)
BUN: 21 mg/dL (ref 8–23)
CO2: 26 mmol/L (ref 22–32)
Calcium: 9.5 mg/dL (ref 8.9–10.3)
Chloride: 106 mmol/L (ref 98–111)
Creatinine: 2.37 mg/dL — ABNORMAL HIGH (ref 0.61–1.24)
GFR, Estimated: 28 mL/min — ABNORMAL LOW (ref >60)
Glucose, Bld: 110 mg/dL — ABNORMAL HIGH (ref 70–99)
Potassium: 4.3 mmol/L (ref 3.5–5.1)
Sodium: 139 mmol/L (ref 135–145)
Total Bilirubin: 0.5 mg/dL (ref 0.3–1.2)
Total Protein: 6.7 g/dL (ref 6.5–8.1)

## 2023-03-24 MED ORDER — PROCHLORPERAZINE MALEATE 10 MG PO TABS
10.0000 mg | ORAL_TABLET | Freq: Once | ORAL | Status: AC
  Administered 2023-03-24: 10 mg via ORAL
  Filled 2023-03-24: qty 1

## 2023-03-24 MED ORDER — BORTEZOMIB CHEMO SQ INJECTION 3.5 MG (2.5MG/ML)
1.3000 mg/m2 | Freq: Once | INTRAMUSCULAR | Status: AC
  Administered 2023-03-24: 3 mg via SUBCUTANEOUS
  Filled 2023-03-24: qty 1.2

## 2023-03-24 NOTE — Progress Notes (Signed)
Per Nelia Shi, [place], ok to proceed with treatment today with Scr 2.37.

## 2023-03-24 NOTE — Patient Instructions (Signed)
[facility] [place] [facility]  Discharge Instructions: Thank you for choosing [facility] to provide your oncology and hematology care.   If you have a lab appointment with the Cancer Center, please go directly to the Cancer Center and check in at the registration area.   Wear comfortable clothing and clothing appropriate for easy access to any Portacath or PICC line.   We strive to give you quality time with your provider. You may need to reschedule your appointment if you arrive late (15 or more minutes).  Arriving late affects you and other patients whose appointments are after yours.  Also, if you miss three or more appointments without notifying the office, you may be dismissed from the clinic at the provider's discretion.      For prescription refill requests, have your pharmacy contact our office and allow 72 hours for refills to be completed.    Today you received the following chemotherapy and/or immunotherapy agents: bortezomib      To help prevent nausea and vomiting after your treatment, we encourage you to take your nausea medication as directed.  BELOW ARE SYMPTOMS THAT SHOULD BE REPORTED IMMEDIATELY: *FEVER GREATER THAN 100.4 F (38 C) OR HIGHER *CHILLS OR SWEATING *NAUSEA AND VOMITING THAT IS NOT CONTROLLED WITH YOUR NAUSEA MEDICATION *UNUSUAL SHORTNESS OF BREATH *UNUSUAL BRUISING OR BLEEDING *URINARY PROBLEMS (pain or burning when urinating, or frequent urination) *BOWEL PROBLEMS (unusual diarrhea, constipation, pain near the anus) TENDERNESS IN MOUTH AND THROAT WITH OR WITHOUT PRESENCE OF ULCERS (sore throat, sores in mouth, or a toothache) UNUSUAL RASH, SWELLING OR PAIN  UNUSUAL VAGINAL DISCHARGE OR ITCHING   Items with * indicate a potential emergency and should be followed up as soon as possible or go to the Emergency Department if any problems should occur.  Please show the CHEMOTHERAPY ALERT CARD or IMMUNOTHERAPY ALERT CARD at  check-in to the Emergency Department and triage nurse.  Should you have questions after your visit or need to cancel or reschedule your appointment, please contact [facility]  Dept: [phone]  and follow the prompts.  Office hours are 8:00 a.m. to 4:30 p.m. Monday - Friday. Please note that voicemails left after 4:00 p.m. may not be returned until the following business day.  We are closed weekends and major holidays. You have access to a nurse at all times for urgent questions. Please call the main number to the clinic Dept: [phone] and follow the prompts.   For any non-urgent questions, you may also contact your provider using MyChart. We now offer e-Visits for anyone 18 and older to request care online for non-urgent symptoms. For details visit [name].   Also download the MyChart app! Go to the app store, search "MyChart", open the app, select [place], and log in with your MyChart username and password.   

## 2023-03-24 NOTE — Progress Notes (Signed)
[facility] [phone]   [phone]  PROGRESS NOTE  Patient Care Team: [name], MD as PCP - General (Internal Medicine)  Hematological/Oncological History # IgA Lambda Multiple Myeloma 05/03/2015: last visit with Dr. Arbutus Ped at the [facility]. Was followed for IgA lambda MGUS. 12/11/2021: labs show M protein 3.4, Kappa 19.2, Lambda 1576.4, ratio 0.01. Cr 3.47, Hgb 8.6, WBC 5.7, MCV 99, Plt 221 12/23/2021: establish care with Dr. Leonides Schanz  01/16/2022: Bone marrow biopsy performed, showed a 60% cellular bone marrow predominantly comprised of plasma cells making up 70 to 80%, lambda restricted.  Myeloma FISH panel showed no evidence of abnormalities. 02/07/2022: Cycle 1 Day 1 of CyBorD chemotherapy.  03/09/2022-03/17/2022: hospitalized for fever/pneumonia.  03/24/2022: Cycle 2 Day 1 of CyBorD chemotherapy.  04/22/2022: Cycle 3 Day 1 of CyBorD chemotherapy 05/19/2022-05/26/2022: Delayed start of Cycle 4 Day 1 of CyBorD chemotherapy due to neutropenia.  06/03/2022: Cycle 4 Day 1 of CyBorD chemotherapy. 25% dose reduction of cyclophosphamide due to cytopenias. 06/30/2022: Cycle 4 Day 22. Drop Velcade dose to 1mg /m2 and Cyclophosphamide 200 mg/m2  07/07/2022: Cycle 5 Day 1 of CyBorD chemotherapy with dose reductions.  08/04/2022: Cycle 6 Day 1 of CyBorD chemotherapy with dose reductions.  09/09/2022: transition to maintenance dose Velcade q 2 weeks.   Interval History:  Darren Hinton. 77 y.o. male with medical history significant for IgA lambda multiple myeloma who presents for a follow up visit. The patient's last visit was on 02/24/2023. In the interim since the last visit he has continued on maintenance Velcade.   On exam today [name] reports that he has completed home physical therapy but planning for repeat PT assessment through the [place]. He reports his leg pain and neuropathy is overall stable but persistent. He admits to a couple of near falls and  one fall this past week due to neuropathy and leg pain. He is using a rollator for short distances and a wheel chair for long distances. He reports his appetite and energy are stable. He denies nausea, vomiting or bowel habit changes. He reports his stools are dark but denies any overt signs of bleeding including hematochezia. He denies fevers, chills, sweats, shortness of breath, chest pain or cough. He has no other complaints. Rest of the 10 point ROS is below.   MEDICAL HISTORY:  Past Medical History:  Diagnosis Date   Allergy    Anxiety    Arthritis    Depression    Heart murmur    Hyperlipidemia    Hyperplastic colon polyp    Hyperproteinemia    Hypertension    Hypothyroidism    Mitral valve prolapse    Paroxysmal atrial fibrillation (HCC)    Thyroid disease     SURGICAL HISTORY: Past Surgical History:  Procedure Laterality Date   CARDIOVERSION N/A 03/14/2022   Procedure: CARDIOVERSION;  Surgeon: Chrystie Nose, MD;  Location: [facility] ENDOSCOPY;  Service: Cardiovascular;  Laterality: N/A;   TEE WITHOUT CARDIOVERSION N/A 03/14/2022   Procedure: TRANSESOPHAGEAL ECHOCARDIOGRAM (TEE);  Surgeon: Chrystie Nose, MD;  Location: [facility] ENDOSCOPY;  Service: Cardiovascular;  Laterality: N/A;    SOCIAL HISTORY: Social History   Socioeconomic History   Marital status: Married    Spouse name: Not on file   Number of children: Not on file   Years of education: Not on file   Highest education level: Not on file  Occupational History   Not on file  Tobacco Use   Smoking status: Former  Current packs/day: 0.00    Types: Cigarettes    Start date: 10/01/1958    Quit date: 10/01/1978    Years since quitting: 44.5   Smokeless tobacco: Never   Tobacco comments:    Former smoker 04/03/22  Substance and Sexual Activity   Alcohol use: Yes    Alcohol/week: 2.0 standard drinks of alcohol    Types: 2 Glasses of wine per week    Comment: occasionally   Drug use: No   Sexual activity: Not on  file  Other Topics Concern   Not on file  Social History Narrative   Not on file   Social Determinants of Health   Financial Resource Strain: Not on file  Food Insecurity: Not on file  Transportation Needs: Not on file  Physical Activity: Not on file  Stress: Not on file  Social Connections: Not on file  Intimate Partner Violence: Not on file    FAMILY HISTORY: Family History  Problem Relation Age of Onset   Cancer Father    Colon cancer Neg Hx    Esophageal cancer Neg Hx    Liver cancer Neg Hx    Pancreatic cancer Neg Hx    Rectal cancer Neg Hx    Stomach cancer Neg Hx     ALLERGIES:  is allergic to zithromax [azithromycin].  MEDICATIONS:  Current Outpatient Medications  Medication Sig Dispense Refill   acyclovir (ZOVIRAX) 400 MG tablet TAKE 1 TABLET BY MOUTH TWICE A DAY 180 tablet 1   ALPRAZolam (XANAX) 0.25 MG tablet Take 0.25 mg by mouth 2 (two) times daily as needed for anxiety.     amiodarone (PACERONE) 200 MG tablet TAKE 1 TABLET BY MOUTH EVERY DAY 90 tablet 3   dexamethasone (DECADRON) 4 MG tablet TAKE 10 TABLETS (40 MG TOTAL) BY MOUTH ONCE A WEEK IN THE MORNING ON CHEMOTHERAPY DAYS 120 tablet 1   ergocalciferol (VITAMIN D2) 1.25 MG (50000 UT) capsule Take 50,000 Units by mouth every Wednesday.     ferrous gluconate (FERGON) 324 MG tablet Take 1 tablet (324 mg total) by mouth 3 (three) times daily with meals. 90 tablet 0   fluticasone (FLONASE) 50 MCG/ACT nasal spray Place 2 sprays into the nose daily as needed for allergies.     gabapentin (NEURONTIN) 300 MG capsule Take 1 capsule (300 mg total) by mouth 2 (two) times daily. 60 capsule 1   levofloxacin (LEVAQUIN) 500 MG tablet Take 1 tablet (500 mg total) by mouth daily. 7 tablet 0   levothyroxine (SYNTHROID) 100 MCG tablet Take 100 mcg by mouth See admin instructions. Take 1 tablet (100 mcg) by mouth on Mondays, Tuesdays, Wednesdays, Thursdays, Fridays & Saturdays in the morning. Skip a dose on Sundays.      loratadine (CLARITIN) 10 MG tablet Take 10 mg by mouth daily as needed for allergies.     meclizine (ANTIVERT) 25 MG tablet Take 1 tablet (25 mg total) by mouth 3 (three) times daily as needed for dizziness. 30 tablet 0   metoprolol succinate (TOPROL-XL) 25 MG 24 hr tablet Take 1 tablet (25 mg total) by mouth daily. Take with or immediately following a meal. 30 tablet 3   Olmesartan-amLODIPine-HCTZ 20-5-12.5 MG TABS Take 1 tablet by mouth daily.     prochlorperazine (COMPAZINE) 10 MG tablet Take 1 tablet (10 mg total) by mouth every 6 (six) hours as needed for nausea or vomiting. 30 tablet 0   rosuvastatin (CRESTOR) 20 MG tablet 1 tablet Orally Once a day for 90  days     senna-docusate (SENOKOT-S) 8.6-50 MG tablet Take 1 tablet by mouth at bedtime as needed for mild constipation.     traMADol (ULTRAM) 50 MG tablet Take 1 tablet (50 mg total) by mouth every 6 (six) hours as needed. 30 tablet 0   vitamin B-12 (CYANOCOBALAMIN) 500 MCG tablet Take 500 mcg by mouth in the morning.     amLODipine (NORVASC) 5 MG tablet TAKE 1 TABLET (5 MG TOTAL) BY MOUTH DAILY. (Patient not taking: Reported on 03/24/2023) 90 tablet 3   apixaban (ELIQUIS) 5 MG TABS tablet Take 1 tablet (5 mg total) by mouth 2 (two) times daily. 180 tablet 1   Current Facility-Administered Medications  Medication Dose Route Frequency Provider Last Rate Last Admin   0.9 %  sodium chloride infusion  500 mL Intravenous Once Armbruster, Willaim Rayas, MD        REVIEW OF SYSTEMS:   Constitutional: ( - ) fevers, ( - )  chills , ( - ) night sweats Eyes: ( - ) blurriness of vision, ( - ) double vision, ( - ) watery eyes Ears, nose, mouth, throat, and face: ( - ) mucositis, ( - ) sore throat Respiratory: ( - ) cough, ( - ) dyspnea, ( - ) wheezes Cardiovascular: ( - ) palpitation, ( - ) chest discomfort, (+ ) lower extremity swelling Gastrointestinal:  ( - ) nausea, ( - ) heartburn, ( - ) change in bowel habits Skin: ( - ) abnormal skin  rashes Lymphatics: ( - ) new lymphadenopathy, ( - ) easy bruising Neurological: ( + ) numbness, ( - ) tingling, ( - ) new weaknesses Behavioral/Psych: ( - ) mood change, ( - ) new changes  All other systems were reviewed with the patient and are negative.  PHYSICAL EXAMINATION: ECOG PERFORMANCE STATUS: 1 - Symptomatic but completely ambulatory  Vitals:   03/24/23 0839  BP: (!) 146/77  Pulse: (!) 59  Resp: 17  Temp: 97.9 F (36.6 C)  SpO2: 99%   Filed Weights   03/24/23 0839  Weight: 243 lb 4.8 oz (110.4 kg)    GENERAL: Chronically ill-appearing elderly African-American male, alert, no distress and comfortable SKIN: skin color, texture, turgor are normal, no rashes or significant lesions EYES: conjunctiva are pink and non-injected, sclera clear LUNGS: clear to auscultation and percussion with normal breathing effort HEART: regular rate & rhythm and no murmurs and + 2 bilateral ankle edema Musculoskeletal: no cyanosis of digits and no clubbing  PSYCH: alert & oriented x 3, fluent speech NEURO: no focal motor/sensory deficits  LABORATORY DATA:  I have reviewed the data as listed    Latest Ref Rng & Units 03/24/2023    8:04 AM 03/09/2023    8:15 AM 02/24/2023   11:04 AM  CBC  WBC 4.0 - 10.5 K/uL 4.7  5.1  4.2   Hemoglobin 13.0 - 17.0 g/dL 08.6  57.8  46.9   Hematocrit 39.0 - 52.0 % 34.3  34.6  35.5   Platelets 150 - 400 K/uL 220  174  210        Latest Ref Rng & Units 03/24/2023    8:04 AM 03/09/2023    8:15 AM 02/24/2023   11:04 AM  CMP  Glucose 70 - 99 mg/dL 629  92  528   BUN 8 - 23 mg/dL 21  20  22    Creatinine 0.61 - 1.24 mg/dL 4.13  2.44  0.10   Sodium 135 - 145 mmol/L 139  142  140   Potassium 3.5 - 5.1 mmol/L 4.3  4.2  4.0   Chloride 98 - 111 mmol/L 106  108  107   CO2 22 - 32 mmol/L 26  27  25    Calcium 8.9 - 10.3 mg/dL 9.5  8.8  9.3   Total Protein 6.5 - 8.1 g/dL 6.7  6.8  6.4   Total Bilirubin 0.3 - 1.2 mg/dL 0.5  0.5  0.5   Alkaline Phos 38 - 126 U/L 57   53  64   AST 15 - 41 U/L 22  24  22    ALT 0 - 44 U/L 19  23  20      Lab Results  Component Value Date   MPROTEIN Not Observed 03/09/2023   MPROTEIN Not Observed 02/24/2023   MPROTEIN Not Observed 02/09/2023   Lab Results  Component Value Date   KPAFRELGTCHN 15.4 03/09/2023   KPAFRELGTCHN 14.3 02/24/2023   KPAFRELGTCHN 13.8 02/09/2023   LAMBDASER 13.2 03/09/2023   LAMBDASER 13.5 02/24/2023   LAMBDASER 12.9 02/09/2023   KAPLAMBRATIO 1.17 03/09/2023   KAPLAMBRATIO 1.06 02/24/2023   KAPLAMBRATIO 1.07 02/09/2023    RADIOGRAPHIC STUDIES: No results found.  ASSESSMENT & PLAN [name] Is a 77 y.o. male with medical history significant for IgA lambda multiple myeloma who presents for a follow up visit. .    # IgA lambda Multiple Myeloma -- Bone marrow biopsy performed on 01/16/2022 showed increased plasma cells consistent with a plasma cell neoplasm.  Normal FISH panel. --Patient meets diagnostic criteria based on kidney dysfunction and anemia. --Cycle 1 Day 1 of CyBorD chemotherapy started on 02/07/2022 --Velcade maintenance therapy started on 09/09/2022.  Plan: --Most recent SPEP from 03/09/2023 showed M protein not detected. , kappa 15.4, lambda 13.2, ratio 1.1 --Labs today show white blood cell 4.7, hemoglobin 11.0, MCV 103.0, and platelets of 220. Creatinine overall stable at 2.37. Calcium level is normal. LFTs normal. Myeloma panel pending.  --continue on maintenance Velcade every 2 weeks with monthly clinic visits.  # Leg Pain -- Likely a component of worsening neuropathy from his chemotherapy with pre-existing sciatica pain. -- continue gabapentin to 300 mg twice daily -- continue tramadol 50 mg every 6 hours as needed -- patient connected with Dr. Barbaraann Cao  #Supportive Care -- chemotherapy education complete -- port placement not required.  -- zofran 8mg  q8H PRN and compazine 10mg  PO q6H for nausea -- acyclovir 400mg  PO BID for VCZ prophylaxis --zometa to start  after dental clearance.   No orders of the defined types were placed in this encounter.  All questions were answered. The patient knows to call the clinic with any problems, questions or concerns.  I have spent a total of 30 minutes minutes of face-to-face and non-face-to-face time, preparing to see the patient, performing a medically appropriate examination, counseling and educating the patient, documenting clinical information in the electronic health record,  and care coordination.   Georga Kaufmann PA-C Dept of Hematology and Oncology [facility] Phone: [phone]   03/24/2023 8:49 AM

## 2023-03-25 LAB — KAPPA/LAMBDA LIGHT CHAINS
Kappa free light chain: 15.1 mg/L (ref 3.3–19.4)
Kappa, lambda light chain ratio: 1.26 (ref 0.26–1.65)
Lambda free light chains: 12 mg/L (ref 5.7–26.3)

## 2023-03-27 LAB — MULTIPLE MYELOMA PANEL, SERUM
Albumin SerPl Elph-Mcnc: 3.8 g/dL (ref 2.9–4.4)
Albumin/Glob SerPl: 1.8 — ABNORMAL HIGH (ref 0.7–1.7)
Alpha 1: 0.2 g/dL (ref 0.0–0.4)
Alpha2 Glob SerPl Elph-Mcnc: 0.7 g/dL (ref 0.4–1.0)
B-Globulin SerPl Elph-Mcnc: 0.9 g/dL (ref 0.7–1.3)
Gamma Glob SerPl Elph-Mcnc: 0.4 g/dL (ref 0.4–1.8)
Globulin, Total: 2.2 g/dL (ref 2.2–3.9)
IgA: 27 mg/dL — ABNORMAL LOW (ref 61–437)
IgG (Immunoglobin G), Serum: 514 mg/dL — ABNORMAL LOW (ref 603–1613)
IgM (Immunoglobulin M), Srm: 35 mg/dL (ref 15–143)
Total Protein ELP: 6 g/dL (ref 6.0–8.5)

## 2023-03-30 ENCOUNTER — Telehealth: Payer: Self-pay

## 2023-03-30 ENCOUNTER — Encounter: Payer: Self-pay | Admitting: Physician Assistant

## 2023-03-30 ENCOUNTER — Ambulatory Visit: Payer: Medicare Other | Admitting: Physician Assistant

## 2023-03-30 VITALS — BP 130/74 | HR 52 | Ht 77.0 in | Wt 239.0 lb

## 2023-03-30 DIAGNOSIS — K5520 Angiodysplasia of colon without hemorrhage: Secondary | ICD-10-CM

## 2023-03-30 DIAGNOSIS — D649 Anemia, unspecified: Secondary | ICD-10-CM

## 2023-03-30 DIAGNOSIS — R195 Other fecal abnormalities: Secondary | ICD-10-CM | POA: Diagnosis not present

## 2023-03-30 MED ORDER — NA SULFATE-K SULFATE-MG SULF 17.5-3.13-1.6 GM/177ML PO SOLN: 1.0000 | ORAL | 0 refills | Status: DC

## 2023-03-30 NOTE — Telephone Encounter (Signed)
 [facility] Pre-operative Risk Assessment     Request for surgical clearance:     Endoscopy Procedure  What type of surgery is being performed?     Endo/colon  When is this surgery scheduled?     05-07-23  What type of clearance is required ?   Pharmacy  Are there any medications that need to be held prior to surgery and how long? Eliquis x 2   Practice name and name of physician performing surgery?      [facility]  What is your office phone and fax number?      Phone- [phone]  Fax- [phone]  Anesthesia type (None, local, MAC, general) ?       MAC

## 2023-03-30 NOTE — Telephone Encounter (Signed)
   Patient Name: Darren Hinton.  DOB: 06-10-1946 MRN: [id number]  Primary Cardiologist: None  Clinical pharmacists have reviewed the patient's past medical history, labs, and current medications as part of preoperative protocol coverage. The following recommendations have been made:  Patient with diagnosis of atrial fibrillation on Eliquis for anticoagulation.     Procedure: endoscopy/colonoscopy Date of procedure: 05/07/23     CHA2DS2-VASc Score = 3   This indicates a 3.2% annual risk of stroke. The patient's score is based upon: CHF History: 0 HTN History: 1 Diabetes History: 0 Stroke History: 0 Vascular Disease History: 0 Age Score: 2 Gender Score: 0     CrCl 40 Platelet count 220   Per office protocol, patient can hold Eliquis for 2 days prior to procedure.   Patient will not need bridging with Lovenox (enoxaparin) around procedure.       I will route this recommendation to the requesting party via Epic fax function and remove from pre-op pool.  Please call with questions.  Joni Reining, NP 03/30/2023, 2:53 PM

## 2023-03-30 NOTE — Progress Notes (Signed)
Agree with assessment and plan as outlined.  

## 2023-03-30 NOTE — Telephone Encounter (Signed)
Patient with diagnosis of atrial fibrillation on Eliquis for anticoagulation.    Procedure: endoscopy/colonoscopy Date of procedure: 05/07/23   CHA2DS2-VASc Score = 3   This indicates a 3.2% annual risk of stroke. The patient's score is based upon: CHF History: 0 HTN History: 1 Diabetes History: 0 Stroke History: 0 Vascular Disease History: 0 Age Score: 2 Gender Score: 0    CrCl 40 Platelet count 220  Per office protocol, patient can hold Eliquis for 2 days prior to procedure.   Patient will not need bridging with Lovenox (enoxaparin) around procedure.  **This guidance is not considered finalized until pre-operative APP has relayed final recommendations.**

## 2023-03-30 NOTE — Progress Notes (Signed)
Subjective:    Patient ID: Darren Faster., male    DOB: 1945-12-20, 77 y.o.   MRN: [id number]  HPI Darren Hinton is a pleasant 77 year old male, established with Dr. Adela Lank, referred back today by Dr. Ivery Quale for heme positive stool. Patient has history of atrial fibrillation for which she is on Eliquis, cardiomyopathy with last echo April 2024 showing EF of 55 to 60% and no aortic stenosis, history of chronic kidney disease, and multiple myeloma for which she is currently on maintenance chemotherapy with Velcade every 2 weeks. He had Hemoccult done as part of recent physical exam which was positive.  The patient says that he has not noticed any melena or hematochezia.  His stools stay somewhat dark but he was told this may occur because of his chemotherapy and he is also on chronic oral iron.  His bowel movements have been regular, he has no complaints of abdominal pain or cramping, no heartburn or indigestion, no dysphagia or odynophagia. Review of labs-03/24/2023 hemoglobin 11/hematocrit 34/MCV 103, platelets within normal limits April 2024 hemoglobin 11.8/hematocrit 36.13 October 2022 hemoglobin 10.6/hematocrit 32.6  Last colonoscopy was done April 2019 with finding of a single small AVM in the ascending colon which was treated with APC, he has a redundant sigmoid colon and small internal hemorrhoids it was indicated for 10-year interval follow-up.  He has not had prior EGD    Review of Systems Pertinent positive and negative review of systems were noted in the above HPI section.  All other review of systems was otherwise negative.   Outpatient Encounter Medications as of 03/30/2023  Medication Sig   acyclovir (ZOVIRAX) 400 MG tablet TAKE 1 TABLET BY MOUTH TWICE A DAY   ALPRAZolam (XANAX) 0.25 MG tablet Take 0.25 mg by mouth 2 (two) times daily as needed for anxiety.   amiodarone (PACERONE) 200 MG tablet TAKE 1 TABLET BY MOUTH EVERY DAY   amLODipine (NORVASC) 5 MG tablet TAKE 1  TABLET (5 MG TOTAL) BY MOUTH DAILY.   apixaban (ELIQUIS) 5 MG TABS tablet Take 1 tablet (5 mg total) by mouth 2 (two) times daily.   dexamethasone (DECADRON) 4 MG tablet TAKE 10 TABLETS (40 MG TOTAL) BY MOUTH ONCE A WEEK IN THE MORNING ON CHEMOTHERAPY DAYS   ferrous gluconate (FERGON) 324 MG tablet Take 1 tablet (324 mg total) by mouth 3 (three) times daily with meals.   fluticasone (FLONASE) 50 MCG/ACT nasal spray Place 2 sprays into the nose daily as needed for allergies.   gabapentin (NEURONTIN) 300 MG capsule Take 1 capsule (300 mg total) by mouth 2 (two) times daily.   levothyroxine (SYNTHROID) 100 MCG tablet Take 100 mcg by mouth See admin instructions. Take 1 tablet (100 mcg) by mouth on Mondays, Tuesdays, Wednesdays, Thursdays, Fridays & Saturdays in the morning. Skip a dose on Sundays.   loratadine (CLARITIN) 10 MG tablet Take 10 mg by mouth daily as needed for allergies.   meclizine (ANTIVERT) 25 MG tablet Take 1 tablet (25 mg total) by mouth 3 (three) times daily as needed for dizziness.   metoprolol succinate (TOPROL-XL) 25 MG 24 hr tablet Take 1 tablet (25 mg total) by mouth daily. Take with or immediately following a meal.   Na Sulfate-K Sulfate-Mg Sulf 17.5-3.13-1.6 GM/177ML SOLN Take 1 kit by mouth as directed.   Olmesartan-amLODIPine-HCTZ 20-5-12.5 MG TABS Take 1 tablet by mouth daily.   prochlorperazine (COMPAZINE) 10 MG tablet Take 1 tablet (10 mg total) by mouth every 6 (six) hours as  needed for nausea or vomiting.   rosuvastatin (CRESTOR) 20 MG tablet 1 tablet Orally Once a day for 90 days   senna-docusate (SENOKOT-S) 8.6-50 MG tablet Take 1 tablet by mouth at bedtime as needed for mild constipation.   traMADol (ULTRAM) 50 MG tablet Take 1 tablet (50 mg total) by mouth every 6 (six) hours as needed.   vitamin B-12 (CYANOCOBALAMIN) 500 MCG tablet Take 500 mcg by mouth in the morning.   [DISCONTINUED] ergocalciferol (VITAMIN D2) 1.25 MG (50000 UT) capsule Take 50,000 Units by  mouth every Wednesday.   [DISCONTINUED] levofloxacin (LEVAQUIN) 500 MG tablet Take 1 tablet (500 mg total) by mouth daily.   Facility-Administered Encounter Medications as of 03/30/2023  Medication   0.9 %  sodium chloride infusion   Allergies  Allergen Reactions   Zithromax [Azithromycin] Nausea And Vomiting and Other (See Comments)    "Sick on the stomach"   Patient Active Problem List   Diagnosis Date Noted   Chemotherapy-induced neuropathy (HCC) 10/14/2022   Non-rheumatic mitral regurgitation 07/24/2022   Secondary hypercoagulable state (HCC) 04/03/2022   Liver cyst    Tachycardia induced cardiomyopathy (HCC)    Pneumonia due to infectious organism    Atrial fibrillation with RVR (HCC)    SIRS (systemic inflammatory response syndrome) (HCC)    Fever 03/09/2022   Multiple myeloma not having achieved remission (HCC) 01/30/2022   Deficiency anemia 10/26/2014   Multiple myeloma (HCC) 04/06/2012   Social History   Socioeconomic History   Marital status: Married    Spouse name: Bonita Quin   Number of children: 2   Years of education: Not on file   Highest education level: Not on file  Occupational History   Occupation: retired  Tobacco Use   Smoking status: Former    Current packs/day: 0.00    Types: Cigarettes    Start date: 10/01/1958    Quit date: 10/01/1978    Years since quitting: 44.5   Smokeless tobacco: Never   Tobacco comments:    Former smoker 04/03/22  Vaping Use   Vaping status: Never Used  Substance and Sexual Activity   Alcohol use: Not Currently    Alcohol/week: 2.0 standard drinks of alcohol    Types: 2 Glasses of wine per week    Comment: None in many years   Drug use: No   Sexual activity: Not on file  Other Topics Concern   Not on file  Social History Narrative   Not on file   Social Determinants of Health   Financial Resource Strain: Not on file  Food Insecurity: Not on file  Transportation Needs: Not on file  Physical Activity: Not on file   Stress: Not on file  Social Connections: Not on file  Intimate Partner Violence: Not on file    Darren Hinton's family history includes Kidney disease in his brother and mother; Prostate cancer in his father.      Objective:    Vitals:   03/30/23 0952  BP: 130/74  Pulse: (!) 52    Physical Exam. Well-developed well-nourished elderly African-American male in no acute distress, ambulating with a rolling walker, accompanied by his wife.  Very pleasant.  Height, Weight 239, BMI 28.3  HEENT; nontraumatic normocephalic, EOMI, PE R LA, sclera anicteric. Oropharynx; not examined Neck; supple, no JVD Cardiovascular; regular rate and rhythm with S1-S2, soft systolic murmur rub or gallop Pulmonary; Clear bilaterally Abdomen; soft, nontender, nondistended, no palpable mass or hepatosplenomegaly, bowel sounds are active Rectal; not done today, previously  documented heme positive Skin; benign exam, no jaundice rash or appreciable lesions Extremities; no clubbing cyanosis or edema skin warm and dry Neuro/Psych; alert and oriented x4, grossly nonfocal mood and affect appropriate        Assessment & Plan:   #61 77 year old African-American male with heme positive stool in setting of chronic Eliquis and chronic iron therapy. In addition he has a chronic stable macrocytic anemia.  Last colonoscopy April 2019 with finding of a single small ascending colon AVM which was treated with APC, no polyps or other mucosal lesions.  He certainly may have other intestinal AVMs as source for heme positive stool, likewise as last colonoscopy was 5 years ago cannot rule out other mucosal lesions/neoplasm.  #2 multiple myeloma on maintenance chemotherapy with Velcade 3.  Atrial fibrillation on chronic anticoagulation 4.  Chronic anticoagulation-on Eliquis 5.  Chronic kidney disease 6.  Cardiomyopathy with last EF 55 to 60% April 2024  Plan; Patient will be scheduled for colonoscopy and EGD with Dr.  Adela Lank.  Both procedures were discussed in detail with the patient including indications risk and benefits and he is agreeable to proceed. Eliquis will need to be held for 48 hours prior to procedures, we will communicate with his cardiologist Dr. Delorise Jackson to assure this is reasonable for this patient. Further recommendations pending findings at endoscopic evaluation.  Darren Monaco Oswald Hillock PA-C 03/30/2023   Cc: [name], MD

## 2023-03-30 NOTE — Patient Instructions (Signed)
_______________________________________________________  If your blood pressure at your visit was 140/90 or greater, please contact your primary care physician to follow up on this. _______________________________________________________  If you are age 78 or older, your body mass index should be between 23-30. Your Body mass index is 28.34 kg/m. If this is out of the aforementioned range listed, please consider follow up with your Primary Care Provider. ________________________________________________________  The [facility] providers would like to encourage you to use [facility] to communicate with providers for non-urgent requests or questions.  Due to long hold times on the telephone, sending your provider a message by [facility] may be a faster and more efficient way to get a response.  Please allow 48 business hours for a response.  Please remember that this is for non-urgent requests.  _______________________________________________________  Bonita Quin have been scheduled for an endoscopy and colonoscopy. Please follow the written instructions given to you at your visit today.  Please pick up your prep supplies at the pharmacy within the next 1-3 days.  If you use inhalers (even only as needed), please bring them with you on the day of your procedure.  DO NOT TAKE 7 DAYS PRIOR TO TEST- Trulicity (dulaglutide) Ozempic, Wegovy (semaglutide) Mounjaro (tirzepatide) Bydureon Bcise (exanatide extended release)  DO NOT TAKE 1 DAY PRIOR TO YOUR TEST Rybelsus (semaglutide) Adlyxin (lixisenatide) Victoza (liraglutide) Byetta (exanatide) ___________________________________________________________________________  Bonita Quin will be contacted by our office prior to your procedure for directions on holding your Eliquis.  If you do not hear from our office 1 week prior to your scheduled procedure, please call [phone] to discuss.   Due to recent changes in healthcare laws, you may see the results of your  imaging and laboratory studies on MyChart before your provider has had a chance to review them.  We understand that in some cases there may be results that are confusing or concerning to you. Not all laboratory results come back in the same time frame and the provider may be waiting for multiple results in order to interpret others.  Please give [place] 48 hours in order for your provider to thoroughly review all the results before contacting the office for clarification of your results.   Thank you for entrusting me with your care and choosing [facility].  Amy Esterwood, PA-C

## 2023-03-31 NOTE — Telephone Encounter (Signed)
Patient advised that he has been given clearance to hold Eliquis 2 days prior to endoscopy/colonoscopy scheduled for 05-07-23.  Patient advised to take last dose of Eliquis on 05-04-23, and he will be advised when to restart Eliquis by Dr Adela Lank after the procedure.  Patient agreed to plan and verbalized understanding.  No further questions.

## 2023-04-06 ENCOUNTER — Inpatient Hospital Stay: Payer: No Typology Code available for payment source

## 2023-04-06 VITALS — BP 146/70 | HR 59 | Temp 98.0°F | Resp 16 | Wt 242.0 lb

## 2023-04-06 DIAGNOSIS — C9000 Multiple myeloma not having achieved remission: Secondary | ICD-10-CM

## 2023-04-06 DIAGNOSIS — Z5112 Encounter for antineoplastic immunotherapy: Secondary | ICD-10-CM | POA: Diagnosis not present

## 2023-04-06 LAB — CBC WITH DIFFERENTIAL (CANCER CENTER ONLY)
Abs Immature Granulocytes: 0.03 10*3/uL (ref 0.00–0.07)
Basophils Absolute: 0 10*3/uL (ref 0.0–0.1)
Basophils Relative: 1 %
Eosinophils Absolute: 0.2 10*3/uL (ref 0.0–0.5)
Eosinophils Relative: 5 %
HCT: 33.2 % — ABNORMAL LOW (ref 39.0–52.0)
Hemoglobin: 11 g/dL — ABNORMAL LOW (ref 13.0–17.0)
Immature Granulocytes: 1 %
Lymphocytes Relative: 22 %
Lymphs Abs: 1 10*3/uL (ref 0.7–4.0)
MCH: 34 pg (ref 26.0–34.0)
MCHC: 33.1 g/dL (ref 30.0–36.0)
MCV: 102.5 fL — ABNORMAL HIGH (ref 80.0–100.0)
Monocytes Absolute: 0.5 10*3/uL (ref 0.1–1.0)
Monocytes Relative: 11 %
Neutro Abs: 2.9 10*3/uL (ref 1.7–7.7)
Neutrophils Relative %: 60 %
Platelet Count: 170 10*3/uL (ref 150–400)
RBC: 3.24 MIL/uL — ABNORMAL LOW (ref 4.22–5.81)
RDW: 15.3 % (ref 11.5–15.5)
WBC Count: 4.7 10*3/uL (ref 4.0–10.5)
nRBC: 0 % (ref 0.0–0.2)

## 2023-04-06 LAB — CMP (CANCER CENTER ONLY)
ALT: 18 U/L (ref 0–44)
AST: 20 U/L (ref 15–41)
Albumin: 4.3 g/dL (ref 3.5–5.0)
Alkaline Phosphatase: 53 U/L (ref 38–126)
Anion gap: 7 (ref 5–15)
BUN: 25 mg/dL — ABNORMAL HIGH (ref 8–23)
CO2: 25 mmol/L (ref 22–32)
Calcium: 9.4 mg/dL (ref 8.9–10.3)
Chloride: 108 mmol/L (ref 98–111)
Creatinine: 2.24 mg/dL — ABNORMAL HIGH (ref 0.61–1.24)
GFR, Estimated: 29 mL/min — ABNORMAL LOW (ref >60)
Glucose, Bld: 129 mg/dL — ABNORMAL HIGH (ref 70–99)
Potassium: 4.1 mmol/L (ref 3.5–5.1)
Sodium: 140 mmol/L (ref 135–145)
Total Bilirubin: 0.5 mg/dL (ref 0.3–1.2)
Total Protein: 6.2 g/dL — ABNORMAL LOW (ref 6.5–8.1)

## 2023-04-06 MED ORDER — PROCHLORPERAZINE MALEATE 10 MG PO TABS
10.0000 mg | ORAL_TABLET | Freq: Once | ORAL | Status: AC
  Administered 2023-04-06: 10 mg via ORAL
  Filled 2023-04-06: qty 1

## 2023-04-06 MED ORDER — BORTEZOMIB CHEMO SQ INJECTION 3.5 MG (2.5MG/ML)
1.3000 mg/m2 | Freq: Once | INTRAMUSCULAR | Status: AC
  Administered 2023-04-06: 3 mg via SUBCUTANEOUS
  Filled 2023-04-06: qty 1.2

## 2023-04-06 NOTE — Patient Instructions (Signed)
[facility]  Discharge Instructions: Thank you for choosing [facility] to provide your oncology and hematology care.   If you have a lab appointment with the Cancer Center, please go directly to the Cancer Center and check in at the registration area.   Wear comfortable clothing and clothing appropriate for easy access to any Portacath or PICC line.   We strive to give you quality time with your provider. You may need to reschedule your appointment if you arrive late (15 or more minutes).  Arriving late affects you and other patients whose appointments are after yours.  Also, if you miss three or more appointments without notifying the office, you may be dismissed from the clinic at the provider's discretion.      For prescription refill requests, have your pharmacy contact our office and allow 72 hours for refills to be completed.    Today you received the following chemotherapy and/or immunotherapy agents: Velcade        To help prevent nausea and vomiting after your treatment, we encourage you to take your nausea medication as directed.  BELOW ARE SYMPTOMS THAT SHOULD BE REPORTED IMMEDIATELY: *FEVER GREATER THAN 100.4 F (38 C) OR HIGHER *CHILLS OR SWEATING *NAUSEA AND VOMITING THAT IS NOT CONTROLLED WITH YOUR NAUSEA MEDICATION *UNUSUAL SHORTNESS OF BREATH *UNUSUAL BRUISING OR BLEEDING *URINARY PROBLEMS (pain or burning when urinating, or frequent urination) *BOWEL PROBLEMS (unusual diarrhea, constipation, pain near the anus) TENDERNESS IN MOUTH AND THROAT WITH OR WITHOUT PRESENCE OF ULCERS (sore throat, sores in mouth, or a toothache) UNUSUAL RASH, SWELLING OR PAIN  UNUSUAL VAGINAL DISCHARGE OR ITCHING   Items with * indicate a potential emergency and should be followed up as soon as possible or go to the Emergency Department if any problems should occur.  Please show the CHEMOTHERAPY ALERT CARD or IMMUNOTHERAPY ALERT CARD at  check-in to the Emergency Department and triage nurse.  Should you have questions after your visit or need to cancel or reschedule your appointment, please contact [facility]  Dept: [phone]  and follow the prompts.  Office hours are 8:00 a.m. to 4:30 p.m. Monday - Friday. Please note that voicemails left after 4:00 p.m. may not be returned until the following business day.  We are closed weekends and major holidays. You have access to a nurse at all times for urgent questions. Please call the main number to the clinic Dept: [phone] and follow the prompts.   For any non-urgent questions, you may also contact your provider using MyChart. We now offer e-Visits for anyone 18 and older to request care online for non-urgent symptoms. For details visit [email or web address].   Also download the MyChart app! Go to the app store, search "MyChart", open the app, select [facility], and log in with your MyChart username and password.  

## 2023-04-06 NOTE — Progress Notes (Signed)
Per Leonides Schanz, MD okay to treat with elevated creatinine.

## 2023-04-17 NOTE — Progress Notes (Addendum)
[facility] OFFICE PROGRESS NOTE  Darren Fillers, MD  ASSESSMENT & PLAN:  Multiple myeloma [name]) 04/17/2023 - labs with Velcade injection for maintenance therapy  Review labs -   Chemotherapy-induced neuropathy (HCC) Continue with gabapentin as prescribed May take tramadol as needed and as prescribed    Plan: Review labs  Continue with Telcade injections every 2 weeks. Labs with clinic visit monthly   No orders of the defined types were placed in this encounter.   The total time spent in the appointment was {CHL ONC TIME VISIT - UEAVW:[id number]} encounter with patients including review of chart and various tests results, discussions about plan of care and coordination of care plan   All questions were answered. The patient knows to call the clinic with any problems, questions or concerns. No barriers to learning was detected.    Darren Jews, NP 8/9/20241:59 PM  INTERVAL HISTORY: Darren Hinton. 77 y.o. male returns for f/u IgA Lambda Multiple Myeloma  IgA Lambda Multiple Myeloma 05/03/2015: started with Dr. Arbutus Ped at the [facility]. Was followed for IgA lambda MGUS. 12/11/2021: labs show M protein 3.4, Kappa 19.2, Lambda 1576.4, ratio 0.01. Cr 3.47, Hgb 8.6, WBC 5.7, MCV 99, Plt 221 12/23/2021: establish care with Dr. Leonides Schanz  01/16/2022: Bone marrow biopsy performed, showed a 60% cellular bone marrow predominantly comprised of plasma cells making up 70 to 80%, lambda restricted.  Myeloma FISH panel showed no evidence of abnormalities. 02/07/2022: Cycle 1 Day 1 of CyBorD chemotherapy.  03/09/2022-03/17/2022: hospitalized for fever/pneumonia.  03/24/2022: Cycle 2 Day 1 of CyBorD chemotherapy.  04/22/2022: Cycle 3 Day 1 of CyBorD chemotherapy 05/19/2022-05/26/2022: Delayed start of Cycle 4 Day 1 of CyBorD chemotherapy due to neutropenia.  06/03/2022: Cycle 4 Day 1 of CyBorD chemotherapy. 25% dose reduction of cyclophosphamide due to cytopenias. 06/30/2022:  Cycle 4 Day 22. Drop Velcade dose to 1mg /m2 and Cyclophosphamide 200 mg/m2  07/07/2022: Cycle 5 Day 1 of CyBorD chemotherapy with dose reductions.  08/04/2022: Cycle 6 Day 1 of CyBorD chemotherapy with dose reductions.  09/09/2022: transition to maintenance dose Velcade q 2 weeks  I have reviewed the past medical history, past surgical history, social history and family history with the patient and they are unchanged from previous note.  ALLERGIES:  is allergic to zithromax [azithromycin].  MEDICATIONS:  Current Outpatient Medications  Medication Sig Dispense Refill   acyclovir (ZOVIRAX) 400 MG tablet TAKE 1 TABLET BY MOUTH TWICE A DAY 180 tablet 1   ALPRAZolam (XANAX) 0.25 MG tablet Take 0.25 mg by mouth 2 (two) times daily as needed for anxiety.     amiodarone (PACERONE) 200 MG tablet TAKE 1 TABLET BY MOUTH EVERY DAY 90 tablet 3   amLODipine (NORVASC) 5 MG tablet TAKE 1 TABLET (5 MG TOTAL) BY MOUTH DAILY. 90 tablet 3   apixaban (ELIQUIS) 5 MG TABS tablet Take 1 tablet (5 mg total) by mouth 2 (two) times daily. 180 tablet 1   dexamethasone (DECADRON) 4 MG tablet TAKE 10 TABLETS (40 MG TOTAL) BY MOUTH ONCE A WEEK IN THE MORNING ON CHEMOTHERAPY DAYS 120 tablet 1   ferrous gluconate (FERGON) 324 MG tablet Take 1 tablet (324 mg total) by mouth 3 (three) times daily with meals. 90 tablet 0   fluticasone (FLONASE) 50 MCG/ACT nasal spray Place 2 sprays into the nose daily as needed for allergies.     gabapentin (NEURONTIN) 300 MG capsule Take 1 capsule (300 mg total) by mouth 2 (two) times daily.  60 capsule 1   levothyroxine (SYNTHROID) 100 MCG tablet Take 100 mcg by mouth See admin instructions. Take 1 tablet (100 mcg) by mouth on Mondays, Tuesdays, Wednesdays, Thursdays, Fridays & Saturdays in the morning. Skip a dose on Sundays.     loratadine (CLARITIN) 10 MG tablet Take 10 mg by mouth daily as needed for allergies.     meclizine (ANTIVERT) 25 MG tablet Take 1 tablet (25 mg total) by mouth 3  (three) times daily as needed for dizziness. 30 tablet 0   metoprolol succinate (TOPROL-XL) 25 MG 24 hr tablet Take 1 tablet (25 mg total) by mouth daily. Take with or immediately following a meal. 30 tablet 3   Na Sulfate-K Sulfate-Mg Sulf 17.5-3.13-1.6 GM/177ML SOLN Take 1 kit by mouth as directed. 354 mL 0   Olmesartan-amLODIPine-HCTZ 20-5-12.5 MG TABS Take 1 tablet by mouth daily.     prochlorperazine (COMPAZINE) 10 MG tablet Take 1 tablet (10 mg total) by mouth every 6 (six) hours as needed for nausea or vomiting. 30 tablet 0   rosuvastatin (CRESTOR) 20 MG tablet 1 tablet Orally Once a day for 90 days     senna-docusate (SENOKOT-S) 8.6-50 MG tablet Take 1 tablet by mouth at bedtime as needed for mild constipation.     traMADol (ULTRAM) 50 MG tablet Take 1 tablet (50 mg total) by mouth every 6 (six) hours as needed. 30 tablet 0   vitamin B-12 (CYANOCOBALAMIN) 500 MCG tablet Take 500 mcg by mouth in the morning.     Current Facility-Administered Medications  Medication Dose Route Frequency Provider Last Rate Last Admin   0.9 %  sodium chloride infusion  500 mL Intravenous Once Armbruster, Willaim Rayas, MD         REVIEW OF SYSTEMS:   Constitutional: Denies fevers, chills or night sweats Eyes: Denies blurriness of vision Ears, nose, mouth, throat, and face: Denies mucositis or sore throat Respiratory: Denies cough, dyspnea or wheezes Cardiovascular: Denies palpitation, chest discomfort or lower extremity swelling Gastrointestinal:  Denies nausea, heartburn or change in bowel habits Skin: Denies abnormal skin rashes Lymphatics: Denies new lymphadenopathy or easy bruising Neurological:Denies numbness, tingling or new weaknesses Behavioral/Psych: Mood is stable, no new changes  All other systems were reviewed with the patient and are negative.  PHYSICAL EXAMINATION: ECOG PERFORMANCE STATUS: {CHL ONC ECOG PS:[id number]}  There were no vitals filed for this visit. There were no vitals filed  for this visit.  GENERAL:alert, no distress and comfortable SKIN: skin color, texture, turgor are normal, no rashes or significant lesions EYES: normal, Conjunctiva are pink and non-injected, sclera clear OROPHARYNX:no exudate, no erythema and lips, buccal mucosa, and tongue normal  NECK: supple, thyroid normal size, non-tender, without nodularity LYMPH:  no palpable lymphadenopathy in the cervical, axillary or inguinal LUNGS: clear to auscultation and percussion with normal breathing effort HEART: regular rate & rhythm and no murmurs and no lower extremity edema ABDOMEN:abdomen soft, non-tender and normal bowel sounds Musculoskeletal:no cyanosis of digits and no clubbing  NEURO: alert & oriented x 3 with fluent speech, no focal motor/sensory deficits  LABORATORY DATA:  I have reviewed the data as listed     Component Value Date/Time   NA 140 04/06/2023 0732   NA 141 04/26/2015 0930   K 4.1 04/06/2023 0732   K 4.0 04/26/2015 0930   CL 108 04/06/2023 0732   CL 105 10/01/2012 0817   CO2 25 04/06/2023 0732   CO2 21 (L) 04/26/2015 0930   GLUCOSE 129 (H) 04/06/2023  0732   GLUCOSE 98 04/26/2015 0930   GLUCOSE 90 10/01/2012 0817   BUN 25 (H) 04/06/2023 0732   BUN 9.4 04/26/2015 0930   CREATININE 2.24 (H) 04/06/2023 0732   CREATININE 1.4 (H) 04/26/2015 0930   CALCIUM 9.4 04/06/2023 0732   CALCIUM 9.0 04/26/2015 0930   PROT 6.2 (L) 04/06/2023 0732   PROT 7.6 04/26/2015 0930   ALBUMIN 4.3 04/06/2023 0732   ALBUMIN 4.0 04/26/2015 0930   AST 20 04/06/2023 0732   AST 19 04/26/2015 0930   ALT 18 04/06/2023 0732   ALT 12 04/26/2015 0930   ALKPHOS 53 04/06/2023 0732   ALKPHOS 87 04/26/2015 0930   BILITOT 0.5 04/06/2023 0732   BILITOT 0.43 04/26/2015 0930   GFRNONAA 29 (L) 04/06/2023 0732   GFRAA 68 (L) 03/12/2014 1643    No results found for: "SPEP", "UPEP"  Lab Results  Component Value Date   WBC 4.7 04/06/2023   NEUTROABS 2.9 04/06/2023   HGB 11.0 (L) 04/06/2023   HCT  33.2 (L) 04/06/2023   MCV 102.5 (H) 04/06/2023   PLT 170 04/06/2023       RADIOGRAPHIC STUDIES: I have personally reviewed the radiological images as listed and agreed with the findings in the report. No results found.

## 2023-04-17 NOTE — Assessment & Plan Note (Signed)
04/17/2023 - labs with Velcade injection for maintenance therapy  Review labs -

## 2023-04-17 NOTE — Assessment & Plan Note (Addendum)
Patient reporting 1 fall and 2 big stumbles at home since his last treatment. Has developed neuropathy, causing foot drop of right foot.  Has discussed with primary care and has a boot ordered to help patient with ambulation and balance.  Continue with gabapentin as prescribed May take tramadol as needed and as prescribed

## 2023-04-18 ENCOUNTER — Other Ambulatory Visit: Payer: Self-pay

## 2023-04-20 ENCOUNTER — Inpatient Hospital Stay: Payer: No Typology Code available for payment source | Attending: Hematology and Oncology

## 2023-04-20 ENCOUNTER — Inpatient Hospital Stay (HOSPITAL_BASED_OUTPATIENT_CLINIC_OR_DEPARTMENT_OTHER): Payer: No Typology Code available for payment source | Admitting: Nurse Practitioner

## 2023-04-20 ENCOUNTER — Inpatient Hospital Stay: Payer: No Typology Code available for payment source

## 2023-04-20 ENCOUNTER — Other Ambulatory Visit: Payer: Self-pay

## 2023-04-20 VITALS — BP 118/69 | HR 51 | Temp 98.0°F | Resp 18 | Wt 242.6 lb

## 2023-04-20 DIAGNOSIS — Z881 Allergy status to other antibiotic agents status: Secondary | ICD-10-CM | POA: Insufficient documentation

## 2023-04-20 DIAGNOSIS — Z7901 Long term (current) use of anticoagulants: Secondary | ICD-10-CM | POA: Diagnosis not present

## 2023-04-20 DIAGNOSIS — I4891 Unspecified atrial fibrillation: Secondary | ICD-10-CM

## 2023-04-20 DIAGNOSIS — G620 Drug-induced polyneuropathy: Secondary | ICD-10-CM

## 2023-04-20 DIAGNOSIS — C9000 Multiple myeloma not having achieved remission: Secondary | ICD-10-CM | POA: Diagnosis not present

## 2023-04-20 DIAGNOSIS — Z79899 Other long term (current) drug therapy: Secondary | ICD-10-CM | POA: Insufficient documentation

## 2023-04-20 DIAGNOSIS — Z5112 Encounter for antineoplastic immunotherapy: Secondary | ICD-10-CM | POA: Insufficient documentation

## 2023-04-20 DIAGNOSIS — Z7969 Long term (current) use of other immunomodulators and immunosuppressants: Secondary | ICD-10-CM | POA: Insufficient documentation

## 2023-04-20 DIAGNOSIS — T451X5A Adverse effect of antineoplastic and immunosuppressive drugs, initial encounter: Secondary | ICD-10-CM | POA: Diagnosis not present

## 2023-04-20 DIAGNOSIS — D500 Iron deficiency anemia secondary to blood loss (chronic): Secondary | ICD-10-CM | POA: Diagnosis not present

## 2023-04-20 DIAGNOSIS — M21371 Foot drop, right foot: Secondary | ICD-10-CM | POA: Insufficient documentation

## 2023-04-20 DIAGNOSIS — K922 Gastrointestinal hemorrhage, unspecified: Secondary | ICD-10-CM | POA: Diagnosis not present

## 2023-04-20 LAB — CBC WITH DIFFERENTIAL (CANCER CENTER ONLY)
Abs Immature Granulocytes: 0.01 10*3/uL (ref 0.00–0.07)
Basophils Absolute: 0 10*3/uL (ref 0.0–0.1)
Basophils Relative: 1 %
Eosinophils Absolute: 0.2 10*3/uL (ref 0.0–0.5)
Eosinophils Relative: 5 %
HCT: 32.1 % — ABNORMAL LOW (ref 39.0–52.0)
Hemoglobin: 10.6 g/dL — ABNORMAL LOW (ref 13.0–17.0)
Immature Granulocytes: 0 %
Lymphocytes Relative: 19 %
Lymphs Abs: 0.8 10*3/uL (ref 0.7–4.0)
MCH: 33.5 pg (ref 26.0–34.0)
MCHC: 33 g/dL (ref 30.0–36.0)
MCV: 101.6 fL — ABNORMAL HIGH (ref 80.0–100.0)
Monocytes Absolute: 0.5 10*3/uL (ref 0.1–1.0)
Monocytes Relative: 12 %
Neutro Abs: 2.7 10*3/uL (ref 1.7–7.7)
Neutrophils Relative %: 63 %
Platelet Count: 202 10*3/uL (ref 150–400)
RBC: 3.16 MIL/uL — ABNORMAL LOW (ref 4.22–5.81)
RDW: 15.4 % (ref 11.5–15.5)
WBC Count: 4.4 10*3/uL (ref 4.0–10.5)
nRBC: 0 % (ref 0.0–0.2)

## 2023-04-20 LAB — CMP (CANCER CENTER ONLY)
ALT: 22 U/L (ref 0–44)
AST: 23 U/L (ref 15–41)
Albumin: 4.2 g/dL (ref 3.5–5.0)
Alkaline Phosphatase: 49 U/L (ref 38–126)
Anion gap: 7 (ref 5–15)
BUN: 25 mg/dL — ABNORMAL HIGH (ref 8–23)
CO2: 25 mmol/L (ref 22–32)
Calcium: 8.7 mg/dL — ABNORMAL LOW (ref 8.9–10.3)
Chloride: 111 mmol/L (ref 98–111)
Creatinine: 2.55 mg/dL — ABNORMAL HIGH (ref 0.61–1.24)
GFR, Estimated: 25 mL/min — ABNORMAL LOW (ref >60)
Glucose, Bld: 111 mg/dL — ABNORMAL HIGH (ref 70–99)
Potassium: 4 mmol/L (ref 3.5–5.1)
Sodium: 143 mmol/L (ref 135–145)
Total Bilirubin: 0.5 mg/dL (ref 0.3–1.2)
Total Protein: 6.5 g/dL (ref 6.5–8.1)

## 2023-04-20 MED ORDER — PROCHLORPERAZINE MALEATE 10 MG PO TABS
10.0000 mg | ORAL_TABLET | Freq: Once | ORAL | Status: AC
  Administered 2023-04-20: 10 mg via ORAL
  Filled 2023-04-20: qty 1

## 2023-04-20 MED ORDER — BORTEZOMIB CHEMO SQ INJECTION 3.5 MG (2.5MG/ML)
1.3000 mg/m2 | Freq: Once | INTRAMUSCULAR | Status: AC
  Administered 2023-04-20: 3 mg via SUBCUTANEOUS
  Filled 2023-04-20: qty 1.2

## 2023-04-20 NOTE — Assessment & Plan Note (Signed)
Stable.  On eliquis.   

## 2023-04-20 NOTE — Assessment & Plan Note (Signed)
Currently receiving maintenance Velcade injections every 2 weeks.  -does have mild anemia which is slightly worse today  --WBC 4.4; Hgb 10.6; Hct 32.1; Plt 202 --add ferritin level to next lab draw  -rising kidney functions  --BUN 25; Creatinine 2.55, eGFR 25, Ca 8.7. ---the patient does see provider for problems with kidney.  Proceed with Velcade injection today Check labs, follow up, and injection in 2 weeks (alrady scheduled for 05/13/2023).

## 2023-04-20 NOTE — Progress Notes (Signed)
Ok to treat with elevated creatinine per Vincent Gros, NP

## 2023-04-20 NOTE — Assessment & Plan Note (Signed)
Gradual decline of Hgb. Today is 10.6.  He is seeing GI provider for microscopic blood loss through GI tract.  Scheduled for endoscopy and colonoscopy in near future.

## 2023-04-20 NOTE — Patient Instructions (Signed)
[facility]  Discharge Instructions: Thank you for choosing [facility] to provide your oncology and hematology care.   If you have a lab appointment with the Cancer Center, please go directly to the Cancer Center and check in at the registration area.   Wear comfortable clothing and clothing appropriate for easy access to any Portacath or PICC line.   We strive to give you quality time with your provider. You may need to reschedule your appointment if you arrive late (15 or more minutes).  Arriving late affects you and other patients whose appointments are after yours.  Also, if you miss three or more appointments without notifying the office, you may be dismissed from the clinic at the provider's discretion.      For prescription refill requests, have your pharmacy contact our office and allow 72 hours for refills to be completed.    Today you received the following chemotherapy and/or immunotherapy agents: Velcade        To help prevent nausea and vomiting after your treatment, we encourage you to take your nausea medication as directed.  BELOW ARE SYMPTOMS THAT SHOULD BE REPORTED IMMEDIATELY: *FEVER GREATER THAN 100.4 F (38 C) OR HIGHER *CHILLS OR SWEATING *NAUSEA AND VOMITING THAT IS NOT CONTROLLED WITH YOUR NAUSEA MEDICATION *UNUSUAL SHORTNESS OF BREATH *UNUSUAL BRUISING OR BLEEDING *URINARY PROBLEMS (pain or burning when urinating, or frequent urination) *BOWEL PROBLEMS (unusual diarrhea, constipation, pain near the anus) TENDERNESS IN MOUTH AND THROAT WITH OR WITHOUT PRESENCE OF ULCERS (sore throat, sores in mouth, or a toothache) UNUSUAL RASH, SWELLING OR PAIN  UNUSUAL VAGINAL DISCHARGE OR ITCHING   Items with * indicate a potential emergency and should be followed up as soon as possible or go to the Emergency Department if any problems should occur.  Please show the CHEMOTHERAPY ALERT CARD or IMMUNOTHERAPY ALERT CARD at  check-in to the Emergency Department and triage nurse.  Should you have questions after your visit or need to cancel or reschedule your appointment, please contact [facility]  Dept: [phone]  and follow the prompts.  Office hours are 8:00 a.m. to 4:30 p.m. Monday - Friday. Please note that voicemails left after 4:00 p.m. may not be returned until the following business day.  We are closed weekends and major holidays. You have access to a nurse at all times for urgent questions. Please call the main number to the clinic Dept: [phone] and follow the prompts.   For any non-urgent questions, you may also contact your provider using MyChart. We now offer e-Visits for anyone 18 and older to request care online for non-urgent symptoms. For details visit [email or web address].   Also download the MyChart app! Go to the app store, search "MyChart", open the app, select [facility], and log in with your MyChart username and password.  

## 2023-04-21 ENCOUNTER — Ambulatory Visit: Payer: No Typology Code available for payment source

## 2023-04-21 ENCOUNTER — Other Ambulatory Visit: Payer: No Typology Code available for payment source

## 2023-04-21 ENCOUNTER — Ambulatory Visit: Payer: No Typology Code available for payment source | Admitting: Hematology and Oncology

## 2023-04-23 ENCOUNTER — Other Ambulatory Visit: Payer: Self-pay | Admitting: Hematology and Oncology

## 2023-04-28 ENCOUNTER — Encounter: Payer: Self-pay | Admitting: Gastroenterology

## 2023-05-04 ENCOUNTER — Telehealth: Payer: Self-pay | Admitting: *Deleted

## 2023-05-04 ENCOUNTER — Inpatient Hospital Stay: Payer: No Typology Code available for payment source

## 2023-05-04 VITALS — BP 141/80 | HR 54 | Temp 98.3°F | Resp 16 | Ht 77.0 in | Wt 242.5 lb

## 2023-05-04 DIAGNOSIS — D500 Iron deficiency anemia secondary to blood loss (chronic): Secondary | ICD-10-CM

## 2023-05-04 DIAGNOSIS — C9000 Multiple myeloma not having achieved remission: Secondary | ICD-10-CM

## 2023-05-04 DIAGNOSIS — Z5112 Encounter for antineoplastic immunotherapy: Secondary | ICD-10-CM | POA: Diagnosis not present

## 2023-05-04 LAB — CMP (CANCER CENTER ONLY)
ALT: 20 U/L (ref 0–44)
AST: 23 U/L (ref 15–41)
Albumin: 4.4 g/dL (ref 3.5–5.0)
Alkaline Phosphatase: 52 U/L (ref 38–126)
Anion gap: 8 (ref 5–15)
BUN: 22 mg/dL (ref 8–23)
CO2: 25 mmol/L (ref 22–32)
Calcium: 9.3 mg/dL (ref 8.9–10.3)
Chloride: 107 mmol/L (ref 98–111)
Creatinine: 2.52 mg/dL — ABNORMAL HIGH (ref 0.61–1.24)
GFR, Estimated: 26 mL/min — ABNORMAL LOW (ref >60)
Glucose, Bld: 115 mg/dL — ABNORMAL HIGH (ref 70–99)
Potassium: 4 mmol/L (ref 3.5–5.1)
Sodium: 140 mmol/L (ref 135–145)
Total Bilirubin: 0.5 mg/dL (ref 0.3–1.2)
Total Protein: 6.7 g/dL (ref 6.5–8.1)

## 2023-05-04 LAB — CBC WITH DIFFERENTIAL (CANCER CENTER ONLY)
Abs Immature Granulocytes: 0.01 10*3/uL (ref 0.00–0.07)
Basophils Absolute: 0 10*3/uL (ref 0.0–0.1)
Basophils Relative: 1 %
Eosinophils Absolute: 0.2 10*3/uL (ref 0.0–0.5)
Eosinophils Relative: 5 %
HCT: 33 % — ABNORMAL LOW (ref 39.0–52.0)
Hemoglobin: 10.9 g/dL — ABNORMAL LOW (ref 13.0–17.0)
Immature Granulocytes: 0 %
Lymphocytes Relative: 24 %
Lymphs Abs: 1.1 10*3/uL (ref 0.7–4.0)
MCH: 34.3 pg — ABNORMAL HIGH (ref 26.0–34.0)
MCHC: 33 g/dL (ref 30.0–36.0)
MCV: 103.8 fL — ABNORMAL HIGH (ref 80.0–100.0)
Monocytes Absolute: 0.7 10*3/uL (ref 0.1–1.0)
Monocytes Relative: 15 %
Neutro Abs: 2.4 10*3/uL (ref 1.7–7.7)
Neutrophils Relative %: 55 %
Platelet Count: 193 10*3/uL (ref 150–400)
RBC: 3.18 MIL/uL — ABNORMAL LOW (ref 4.22–5.81)
RDW: 15.7 % — ABNORMAL HIGH (ref 11.5–15.5)
WBC Count: 4.4 10*3/uL (ref 4.0–10.5)
nRBC: 0 % (ref 0.0–0.2)

## 2023-05-04 LAB — FERRITIN: Ferritin: 106 ng/mL (ref 24–336)

## 2023-05-04 MED ORDER — PROCHLORPERAZINE MALEATE 10 MG PO TABS
10.0000 mg | ORAL_TABLET | Freq: Once | ORAL | Status: AC
  Administered 2023-05-04: 10 mg via ORAL
  Filled 2023-05-04: qty 1

## 2023-05-04 MED ORDER — BORTEZOMIB CHEMO SQ INJECTION 3.5 MG (2.5MG/ML)
1.3000 mg/m2 | Freq: Once | INTRAMUSCULAR | Status: AC
  Administered 2023-05-04: 3 mg via SUBCUTANEOUS
  Filled 2023-05-04: qty 1.2

## 2023-05-04 NOTE — Telephone Encounter (Signed)
TCT patient in response to earlier call regarding taking iron before his EGD/colonoscopy.  Spoke with pt's wife. She states Star takes oral iron 3 x a day. They want to know when he should stop his iron prior to EGD/Colonoscopy. Advised that pt only needs to take iron 1 x a day with a source of Vitamin C and if he wantas to stop it prior to  above procedures, that would be alright. Wife voiced understanding.

## 2023-05-04 NOTE — Progress Notes (Signed)
Per Dr. [name], ok to treat with elevated SCr

## 2023-05-04 NOTE — Patient Instructions (Signed)
[facility]  Discharge Instructions: Thank you for choosing [facility] to provide your oncology and hematology care.   If you have a lab appointment with the Cancer Center, please go directly to the Cancer Center and check in at the registration area.   Wear comfortable clothing and clothing appropriate for easy access to any Portacath or PICC line.   We strive to give you quality time with your provider. You may need to reschedule your appointment if you arrive late (15 or more minutes).  Arriving late affects you and other patients whose appointments are after yours.  Also, if you miss three or more appointments without notifying the office, you may be dismissed from the clinic at the provider's discretion.      For prescription refill requests, have your pharmacy contact our office and allow 72 hours for refills to be completed.    Today you received the following chemotherapy and/or immunotherapy agents velcade      To help prevent nausea and vomiting after your treatment, we encourage you to take your nausea medication as directed.  BELOW ARE SYMPTOMS THAT SHOULD BE REPORTED IMMEDIATELY: *FEVER GREATER THAN 100.4 F (38 C) OR HIGHER *CHILLS OR SWEATING *NAUSEA AND VOMITING THAT IS NOT CONTROLLED WITH YOUR NAUSEA MEDICATION *UNUSUAL SHORTNESS OF BREATH *UNUSUAL BRUISING OR BLEEDING *URINARY PROBLEMS (pain or burning when urinating, or frequent urination) *BOWEL PROBLEMS (unusual diarrhea, constipation, pain near the anus) TENDERNESS IN MOUTH AND THROAT WITH OR WITHOUT PRESENCE OF ULCERS (sore throat, sores in mouth, or a toothache) UNUSUAL RASH, SWELLING OR PAIN  UNUSUAL VAGINAL DISCHARGE OR ITCHING   Items with * indicate a potential emergency and should be followed up as soon as possible or go to the Emergency Department if any problems should occur.  Please show the CHEMOTHERAPY ALERT CARD or IMMUNOTHERAPY ALERT CARD at check-in  to the Emergency Department and triage nurse.  Should you have questions after your visit or need to cancel or reschedule your appointment, please contact [facility]  Dept: [phone]  and follow the prompts.  Office hours are 8:00 a.m. to 4:30 p.m. Monday - Friday. Please note that voicemails left after 4:00 p.m. may not be returned until the following business day.  We are closed weekends and major holidays. You have access to a nurse at all times for urgent questions. Please call the main number to the clinic Dept: [phone] and follow the prompts.   For any non-urgent questions, you may also contact your provider using MyChart. We now offer e-Visits for anyone 18 and older to request care online for non-urgent symptoms. For details visit [email or web address].   Also download the MyChart app! Go to the app store, search "MyChart", open the app, select [facility], and log in with your MyChart username and password.   

## 2023-05-07 ENCOUNTER — Encounter: Payer: Self-pay | Admitting: Gastroenterology

## 2023-05-07 ENCOUNTER — Ambulatory Visit (AMBULATORY_SURGERY_CENTER): Payer: Medicare Other | Admitting: Gastroenterology

## 2023-05-07 VITALS — BP 140/87 | HR 102 | Temp 98.0°F | Resp 15 | Ht 77.0 in | Wt 239.0 lb

## 2023-05-07 DIAGNOSIS — D649 Anemia, unspecified: Secondary | ICD-10-CM

## 2023-05-07 DIAGNOSIS — Z1211 Encounter for screening for malignant neoplasm of colon: Secondary | ICD-10-CM

## 2023-05-07 DIAGNOSIS — K317 Polyp of stomach and duodenum: Secondary | ICD-10-CM | POA: Diagnosis not present

## 2023-05-07 DIAGNOSIS — D123 Benign neoplasm of transverse colon: Secondary | ICD-10-CM | POA: Diagnosis not present

## 2023-05-07 DIAGNOSIS — R195 Other fecal abnormalities: Secondary | ICD-10-CM

## 2023-05-07 DIAGNOSIS — K5520 Angiodysplasia of colon without hemorrhage: Secondary | ICD-10-CM

## 2023-05-07 DIAGNOSIS — K3189 Other diseases of stomach and duodenum: Secondary | ICD-10-CM | POA: Diagnosis not present

## 2023-05-07 LAB — KAPPA/LAMBDA LIGHT CHAINS
Kappa free light chain: 14.2 mg/L (ref 3.3–19.4)
Kappa, lambda light chain ratio: 1.19 (ref 0.26–1.65)
Lambda free light chains: 11.9 mg/L (ref 5.7–26.3)

## 2023-05-07 MED ORDER — SODIUM CHLORIDE 0.9 % IV SOLN: 500.0000 mL | Freq: Once | INTRAVENOUS | Status: DC

## 2023-05-07 NOTE — Progress Notes (Signed)
Called to room to assist during endoscopic procedure.  Patient ID and intended procedure confirmed with present staff. Received instructions for my participation in the procedure from the performing physician.  

## 2023-05-07 NOTE — Op Note (Signed)
[facility] Patient Name: Maanas Haran Procedure Date: 05/07/2023 1:50 PM MRN: [id number] Endoscopist: Viviann Spare P. Adela Lank , MD, [id number] Age: 77 Referring MD:  Date of Birth: [birth year] Gender: Male Account #: [id number] Procedure:                Colonoscopy Indications:              Heme positive stool, anemia (iron studies NORMAL) Medicines:                Monitored Anesthesia Care Procedure:                Pre-Anesthesia Assessment:                           - Prior to the procedure, a History and Physical                            was performed, and patient medications and                            allergies were reviewed. The patient's tolerance of                            previous anesthesia was also reviewed. The risks                            and benefits of the procedure and the sedation                            options and risks were discussed with the patient.                            All questions were answered, and informed consent                            was obtained. Prior Anticoagulants: The patient has                            taken Eliquis (apixaban), last dose was 2 days                            prior to procedure. ASA Grade Assessment: III - A                            patient with severe systemic disease. After                            reviewing the risks and benefits, the patient was                            deemed in satisfactory condition to undergo the                            procedure.  After obtaining informed consent, the colonoscope                            was passed under direct vision. Throughout the                            procedure, the patient's blood pressure, pulse, and                            oxygen saturations were monitored continuously. The                            Olympus CF-HQ190L ([id number]) Colonoscope was                            introduced through the anus and  advanced to the the                            terminal ileum, with identification of the                            appendiceal orifice and IC valve. The colonoscopy                            was performed without difficulty. The patient                            tolerated the procedure well. The quality of the                            bowel preparation was adequate. The terminal ileum,                            ileocecal valve, appendiceal orifice, and rectum                            were photographed. Scope In: 2:29:37 PM Scope Out: 2:55:39 PM Scope Withdrawal Time: 0 hours 17 minutes 44 seconds  Total Procedure Duration: 0 hours 26 minutes 2 seconds  Findings:                 The perianal and digital rectal examinations were                            normal.                           The terminal ileum appeared normal.                           A single small angiodysplastic lesion was found in                            the cecum.  A 3 mm polyp was found in the transverse colon. The                            polyp was flat. The polyp was removed with a cold                            snare. Resection and retrieval were complete.                           Internal hemorrhoids were found during retroflexion.                           A large amount of liquid stool was found in the                            entire colon, making visualization difficult.                            Lavage of the area was performed using copious                            amounts of sterile water, resulting in clearance                            with adequate visualization.                           The colon was long and redundant, with looping.                            Cecal intubation was difficult, abdominal pressure                            was utilized.                           The exam was otherwise without abnormality. Complications:            No immediate  complications. Estimated blood loss:                            Minimal. Estimated Blood Loss:     Estimated blood loss was minimal. Impression:               - The examined portion of the ileum was normal.                           - A single colonic angiodysplastic lesion.                           - One 3 mm polyp in the transverse colon, removed                            with a cold snare. Resected and retrieved.                           -  Internal hemorrhoids.                           - Stool in the entire examined colon leading to                            time spent lavaging the colon.                           - Long and redundant colon.                           - The examination was otherwise normal.                           No concerning pathology on this exam for heme                            positive stool - could be hemorrhoidal in the                            setting of Eliquis. No cause for anemia Recommendation:           - Patient has a contact number available for                            emergencies. The signs and symptoms of potential                            delayed complications were discussed with the                            patient. Return to normal activities tomorrow.                            Written discharge instructions were provided to the                            patient.                           - Resume previous diet.                           - Continue present medications.                           - Resume Eliquis tomorrow                           - Await pathology results. Viviann Spare P. Severiano Utsey, MD 05/07/2023 3:03:16 PM This report has been signed electronically.

## 2023-05-07 NOTE — Progress Notes (Signed)
Sedate, gd SR, tolerated procedure well, VSS, report to RN 

## 2023-05-07 NOTE — Op Note (Signed)
[name] Procedure Date: 05/07/2023 2:04 PM MRN: [id number] Endoscopist: Viviann Spare P. Adela Lank , MD, [id number] Age: 77 Referring MD:  Date of Birth: 24-Feb-1946 Gender: Male Account #: [id number] Procedure:                Upper GI endoscopy Indications:              Heme positive stool, anemia (iron studies normal) Medicines:                Monitored Anesthesia Care Procedure:                Pre-Anesthesia Assessment:                           - Prior to the procedure, a History and Physical                            was performed, and patient medications and                            allergies were reviewed. The patient's tolerance of                            previous anesthesia was also reviewed. The risks                            and benefits of the procedure and the sedation                            options and risks were discussed with the patient.                            All questions were answered, and informed consent                            was obtained. Prior Anticoagulants: The patient has                            taken Eliquis (apixaban), last dose was 2 days                            prior to procedure. ASA Grade Assessment: III - A                            patient with severe systemic disease. After                            reviewing the risks and benefits, the patient was                            deemed in satisfactory condition to undergo the                            procedure.  After obtaining informed consent, the endoscope was                            passed under direct vision. Throughout the                            procedure, the patient's blood pressure, pulse, and                            oxygen saturations were monitored continuously. The                            GIF [id number] was introduced through the                            mouth, and advanced to the  second part of duodenum.                            The upper GI endoscopy was accomplished without                            difficulty. The patient tolerated the procedure                            well. Scope In: Scope Out: Findings:                 Esophagogastric landmarks were identified: the                            Z-line was found at 43 cm, the gastroesophageal                            junction was found at 43 cm and the upper extent of                            the gastric folds was found at 43 cm from the                            incisors. Z line just slightly irregular but did                            not meet criteria for Barrett's biopsies.                           The exam of the esophagus was otherwise normal.                           The entire examined stomach was normal.                           A single diminutive sessile polyp was found in the  duodenal bulb. The polyp was removed with a cold                            biopsy forceps. Resection and retrieval were                            complete.                           Lymphangiectasia was present in the second portion                            of the duodenum.                           The exam of the duodenum was otherwise normal. Complications:            No immediate complications. Estimated blood loss:                            Minimal. Estimated Blood Loss:     Estimated blood loss was minimal. Impression:               - Esophagogastric landmarks identified.                           - Slightly irregular zline                           - Normal esophagus otherwise.                           - Normal stomach.                           - A single duodenal polyp. Resected and retrieved.                           - Duodenal mucosal lymphangiectasia.                           - Normal duodenum otherwise.                           No cause for heme positive stool on  EGD. Recommendation:           - Patient has a contact number available for                            emergencies. The signs and symptoms of potential                            delayed complications were discussed with the                            patient. Return to normal activities tomorrow.  Written discharge instructions were provided to the                            patient.                           - Resume previous diet.                           - Continue present medications.                           - Resume Eliquis tomorrow as per colonoscopy note.                           - Await pathology results. Viviann Spare P. Adela Lank, MD 05/07/2023 3:06:55 PM This report has been signed electronically.

## 2023-05-07 NOTE — Patient Instructions (Signed)
Impression/Recommendations:  Polyp and hemorrhoid handouts given to patient.  Resume previous diet. Continue present medications.  Resume Eliquis tomorrow.  Await pathology results.  YOU HAD AN ENDOSCOPIC PROCEDURE TODAY AT THE  ENDOSCOPY CENTER:   Refer to the procedure report that was given to you for any specific questions about what was found during the examination.  If the procedure report does not answer your questions, please call your gastroenterologist to clarify.  If you requested that your care partner not be given the details of your procedure findings, then the procedure report has been included in a sealed envelope for you to review at your convenience later.  YOU SHOULD EXPECT: Some feelings of bloating in the abdomen. Passage of more gas than usual.  Walking can help get rid of the air that was put into your GI tract during the procedure and reduce the bloating. If you had a lower endoscopy (such as a colonoscopy or flexible sigmoidoscopy) you may notice spotting of blood in your stool or on the toilet paper. If you underwent a bowel prep for your procedure, you may not have a normal bowel movement for a few days.  Please Note:  You might notice some irritation and congestion in your nose or some drainage.  This is from the oxygen used during your procedure.  There is no need for concern and it should clear up in a day or so.  SYMPTOMS TO REPORT IMMEDIATELY:  Following lower endoscopy (colonoscopy or flexible sigmoidoscopy):  Excessive amounts of blood in the stool  Significant tenderness or worsening of abdominal pains  Swelling of the abdomen that is new, acute  Fever of 100F or higher  Following upper endoscopy (EGD)  Vomiting of blood or coffee ground material  New chest pain or pain under the shoulder blades  Painful or persistently difficult swallowing  New shortness of breath  Fever of 100F or higher  Black, tarry-looking stools  For urgent or emergent  issues, a gastroenterologist can be reached at any hour by calling [phone]. Do not use MyChart messaging for urgent concerns.    DIET:  We do recommend a small meal at first, but then you may proceed to your regular diet.  Drink plenty of fluids but you should avoid alcoholic beverages for 24 hours.  ACTIVITY:  You should plan to take it easy for the rest of today and you should NOT DRIVE or use heavy machinery until tomorrow (because of the sedation medicines used during the test).    FOLLOW UP: Our staff will call the number listed on your records the next business day following your procedure.  We will call around 7:15- 8:00 am to check on you and address any questions or concerns that you may have regarding the information given to you following your procedure. If we do not reach you, we will leave a message.     If any biopsies were taken you will be contacted by phone or by letter within the next 1-3 weeks.  Please call us at [phone] if you have not heard about the biopsies in 3 weeks.    SIGNATURES/CONFIDENTIALITY: You and/or your care partner have signed paperwork which will be entered into your electronic medical record.  These signatures attest to the fact that that the information above on your After Visit Summary has been reviewed and is understood.  Full responsibility of the confidentiality of this discharge information lies with you and/or your care-partner.

## 2023-05-07 NOTE — Progress Notes (Signed)
History and Physical Interval Note: Patient seen on 03/30/23 - heme positive stool and chronic anemia. Has multiple myeloma on Velcade, on Eliquis for AF - held for 2 days. History of colonic AVM. I have discussed risks / benefits of EGD and colonoscopy with the patient, he wishes to proceed. Last colonoscopy 12/2017 with small AVM noted, no prior EGD.    05/07/2023 2:15 PM  [name]  has presented today for endoscopic procedure(s), with the diagnosis of  Encounter Diagnoses  Name Primary?   Heme positive stool Yes   Chronic anemia   .  The various methods of evaluation and treatment have been discussed with the patient and/or family. After consideration of risks, benefits and other options for treatment, the patient has consented to  the endoscopic procedure(s).   The patient's history has been reviewed, patient examined, no change in status, stable for surgery.  I have reviewed the patient's chart and labs.  Questions were answered to the patient's satisfaction.    Harlin Rain, MD [facility]

## 2023-05-08 ENCOUNTER — Telehealth: Payer: Self-pay

## 2023-05-08 LAB — MULTIPLE MYELOMA PANEL, SERUM
Albumin SerPl Elph-Mcnc: 3.8 g/dL (ref 2.9–4.4)
Albumin/Glob SerPl: 1.8 — ABNORMAL HIGH (ref 0.7–1.7)
Alpha 1: 0.2 g/dL (ref 0.0–0.4)
Alpha2 Glob SerPl Elph-Mcnc: 0.7 g/dL (ref 0.4–1.0)
B-Globulin SerPl Elph-Mcnc: 0.9 g/dL (ref 0.7–1.3)
Gamma Glob SerPl Elph-Mcnc: 0.4 g/dL (ref 0.4–1.8)
Globulin, Total: 2.2 g/dL (ref 2.2–3.9)
IgA: 26 mg/dL — ABNORMAL LOW (ref 61–437)
IgG (Immunoglobin G), Serum: 480 mg/dL — ABNORMAL LOW (ref 603–1613)
IgM (Immunoglobulin M), Srm: 34 mg/dL (ref 15–143)
Total Protein ELP: 6 g/dL (ref 6.0–8.5)

## 2023-05-08 NOTE — Telephone Encounter (Signed)
  Follow up Call-     05/07/2023    1:39 PM  Call back number  Post procedure Call Back phone  # [phone]  Permission to leave phone message Yes     Patient questions:  Do you have a fever, pain , or abdominal swelling? No. Pain Score  0 *  Have you tolerated food without any problems? Yes.    Have you been able to return to your normal activities? Yes.    Do you have any questions about your discharge instructions: Diet   No. Medications  No. Follow up visit  No.  Do you have questions or concerns about your Care? No.  Actions: * If pain score is 4 or above: No action needed, pain <4.

## 2023-05-14 ENCOUNTER — Other Ambulatory Visit (HOSPITAL_COMMUNITY): Payer: Self-pay | Admitting: Cardiovascular Disease

## 2023-05-14 ENCOUNTER — Other Ambulatory Visit: Payer: Self-pay | Admitting: Hematology and Oncology

## 2023-05-15 ENCOUNTER — Encounter: Payer: Self-pay | Admitting: Hematology and Oncology

## 2023-05-18 ENCOUNTER — Other Ambulatory Visit: Payer: Self-pay

## 2023-05-18 ENCOUNTER — Inpatient Hospital Stay: Payer: No Typology Code available for payment source

## 2023-05-18 ENCOUNTER — Inpatient Hospital Stay: Payer: No Typology Code available for payment source | Attending: Hematology and Oncology

## 2023-05-18 ENCOUNTER — Inpatient Hospital Stay (HOSPITAL_BASED_OUTPATIENT_CLINIC_OR_DEPARTMENT_OTHER): Payer: No Typology Code available for payment source | Admitting: Hematology and Oncology

## 2023-05-18 VITALS — BP 142/69 | HR 58 | Temp 98.2°F | Resp 16 | Wt 245.1 lb

## 2023-05-18 DIAGNOSIS — Z79899 Other long term (current) drug therapy: Secondary | ICD-10-CM | POA: Diagnosis not present

## 2023-05-18 DIAGNOSIS — Z79624 Long term (current) use of inhibitors of nucleotide synthesis: Secondary | ICD-10-CM | POA: Insufficient documentation

## 2023-05-18 DIAGNOSIS — I341 Nonrheumatic mitral (valve) prolapse: Secondary | ICD-10-CM | POA: Insufficient documentation

## 2023-05-18 DIAGNOSIS — Z8719 Personal history of other diseases of the digestive system: Secondary | ICD-10-CM | POA: Diagnosis not present

## 2023-05-18 DIAGNOSIS — I480 Paroxysmal atrial fibrillation: Secondary | ICD-10-CM | POA: Diagnosis not present

## 2023-05-18 DIAGNOSIS — Z7969 Long term (current) use of other immunomodulators and immunosuppressants: Secondary | ICD-10-CM | POA: Diagnosis not present

## 2023-05-18 DIAGNOSIS — D649 Anemia, unspecified: Secondary | ICD-10-CM | POA: Insufficient documentation

## 2023-05-18 DIAGNOSIS — C9000 Multiple myeloma not having achieved remission: Secondary | ICD-10-CM

## 2023-05-18 DIAGNOSIS — Z881 Allergy status to other antibiotic agents status: Secondary | ICD-10-CM | POA: Diagnosis not present

## 2023-05-18 DIAGNOSIS — Z5112 Encounter for antineoplastic immunotherapy: Secondary | ICD-10-CM | POA: Diagnosis present

## 2023-05-18 DIAGNOSIS — Z87891 Personal history of nicotine dependence: Secondary | ICD-10-CM | POA: Diagnosis not present

## 2023-05-18 DIAGNOSIS — Z7901 Long term (current) use of anticoagulants: Secondary | ICD-10-CM | POA: Diagnosis not present

## 2023-05-18 LAB — CBC WITH DIFFERENTIAL (CANCER CENTER ONLY)
Abs Immature Granulocytes: 0.02 10*3/uL (ref 0.00–0.07)
Basophils Absolute: 0 10*3/uL (ref 0.0–0.1)
Basophils Relative: 1 %
Eosinophils Absolute: 0.2 10*3/uL (ref 0.0–0.5)
Eosinophils Relative: 4 %
HCT: 32.3 % — ABNORMAL LOW (ref 39.0–52.0)
Hemoglobin: 10.4 g/dL — ABNORMAL LOW (ref 13.0–17.0)
Immature Granulocytes: 0 %
Lymphocytes Relative: 22 %
Lymphs Abs: 1.1 10*3/uL (ref 0.7–4.0)
MCH: 33.7 pg (ref 26.0–34.0)
MCHC: 32.2 g/dL (ref 30.0–36.0)
MCV: 104.5 fL — ABNORMAL HIGH (ref 80.0–100.0)
Monocytes Absolute: 0.7 10*3/uL (ref 0.1–1.0)
Monocytes Relative: 15 %
Neutro Abs: 3 10*3/uL (ref 1.7–7.7)
Neutrophils Relative %: 58 %
Platelet Count: 214 10*3/uL (ref 150–400)
RBC: 3.09 MIL/uL — ABNORMAL LOW (ref 4.22–5.81)
RDW: 15.3 % (ref 11.5–15.5)
WBC Count: 5.1 10*3/uL (ref 4.0–10.5)
nRBC: 0 % (ref 0.0–0.2)

## 2023-05-18 LAB — CMP (CANCER CENTER ONLY)
ALT: 21 U/L (ref 0–44)
AST: 22 U/L (ref 15–41)
Albumin: 4.2 g/dL (ref 3.5–5.0)
Alkaline Phosphatase: 53 U/L (ref 38–126)
Anion gap: 5 (ref 5–15)
BUN: 20 mg/dL (ref 8–23)
CO2: 27 mmol/L (ref 22–32)
Calcium: 9.2 mg/dL (ref 8.9–10.3)
Chloride: 109 mmol/L (ref 98–111)
Creatinine: 2.41 mg/dL — ABNORMAL HIGH (ref 0.61–1.24)
GFR, Estimated: 27 mL/min — ABNORMAL LOW (ref >60)
Glucose, Bld: 93 mg/dL (ref 70–99)
Potassium: 4.5 mmol/L (ref 3.5–5.1)
Sodium: 141 mmol/L (ref 135–145)
Total Bilirubin: 0.4 mg/dL (ref 0.3–1.2)
Total Protein: 6.5 g/dL (ref 6.5–8.1)

## 2023-05-18 MED ORDER — BORTEZOMIB CHEMO SQ INJECTION 3.5 MG (2.5MG/ML)
1.3000 mg/m2 | Freq: Once | INTRAMUSCULAR | Status: AC
  Administered 2023-05-18: 3 mg via SUBCUTANEOUS
  Filled 2023-05-18: qty 1.2

## 2023-05-18 MED ORDER — PROCHLORPERAZINE MALEATE 10 MG PO TABS
10.0000 mg | ORAL_TABLET | Freq: Once | ORAL | Status: AC
  Administered 2023-05-18: 10 mg via ORAL
  Filled 2023-05-18: qty 1

## 2023-05-18 NOTE — Progress Notes (Signed)
[facility] [phone]   [phone]  PROGRESS NOTE  Patient Care Team: [name], MD as PCP - General (Internal Medicine)  Hematological/Oncological History # IgA Lambda Multiple Myeloma 05/03/2015: last visit with Dr. Arbutus Ped at the [facility]. Was followed for IgA lambda MGUS. 12/11/2021: labs show M protein 3.4, Kappa 19.2, Lambda 1576.4, ratio 0.01. Cr 3.47, Hgb 8.6, WBC 5.7, MCV 99, Plt 221 12/23/2021: establish care with Dr. Leonides Schanz  01/16/2022: Bone marrow biopsy performed, showed a 60% cellular bone marrow predominantly comprised of plasma cells making up 70 to 80%, lambda restricted.  Myeloma FISH panel showed no evidence of abnormalities. 02/07/2022: Cycle 1 Day 1 of CyBorD chemotherapy.  03/09/2022-03/17/2022: hospitalized for fever/pneumonia.  03/24/2022: Cycle 2 Day 1 of CyBorD chemotherapy.  04/22/2022: Cycle 3 Day 1 of CyBorD chemotherapy 05/19/2022-05/26/2022: Delayed start of Cycle 4 Day 1 of CyBorD chemotherapy due to neutropenia.  06/03/2022: Cycle 4 Day 1 of CyBorD chemotherapy. 25% dose reduction of cyclophosphamide due to cytopenias. 06/30/2022: Cycle 4 Day 22. Drop Velcade dose to 1mg /m2 and Cyclophosphamide 200 mg/m2  07/07/2022: Cycle 5 Day 1 of CyBorD chemotherapy with dose reductions.  08/04/2022: Cycle 6 Day 1 of CyBorD chemotherapy with dose reductions.  09/09/2022: transition to maintenance dose Velcade q 2 weeks.   Interval History:  Darren Hinton. 77 y.o. male with medical history significant for IgA lambda multiple myeloma who presents for a follow up visit. The patient's last visit was on 02/24/2023. In the interim since the last visit he has continued on maintenance Velcade.   On exam today Darren Hinton reports he has been well overall in the interim since her last visit.  He reports he had a good Labor Day and spent it with his children and grandchildren.  He reports his energy levels are strong but that his biggest  issue is balance.  He notes that he is working with physical therapy in order to try to improve his balance.  He notes some improvement.  He is not having any lightheadedness, dizziness, shortness of breath.  He notes that he has been well with a strong appetite.  He notes that his bowels have been moving well and he is not having any other major side effects as a result of his Velcade shots.  He notes he is willing and able to proceed with Velcade at this time.  He denies fevers, chills, sweats, shortness of breath, chest pain or cough. He has no other complaints. Rest of the 10 point ROS is below.   MEDICAL HISTORY:  Past Medical History:  Diagnosis Date   Allergy    Anxiety    Arthritis    Depression    Heart murmur    Hyperlipidemia    Hyperplastic colon polyp    Hyperproteinemia    Hypertension    Hypothyroidism    Kidney disease    Mitral valve prolapse    Multiple myeloma (HCC)    Paroxysmal atrial fibrillation (HCC)    Thyroid disease     SURGICAL HISTORY: Past Surgical History:  Procedure Laterality Date   CARDIOVERSION N/A 03/14/2022   Procedure: CARDIOVERSION;  Surgeon: Chrystie Nose, MD;  Location: [facility] ENDOSCOPY;  Service: Cardiovascular;  Laterality: N/A;   TEE WITHOUT CARDIOVERSION N/A 03/14/2022   Procedure: TRANSESOPHAGEAL ECHOCARDIOGRAM (TEE);  Surgeon: Chrystie Nose, MD;  Location: [facility] ENDOSCOPY;  Service: Cardiovascular;  Laterality: N/A;    SOCIAL HISTORY: Social History   Socioeconomic History   Marital  status: Married    Spouse name: Bonita Quin   Number of children: 2   Years of education: Not on file   Highest education level: Not on file  Occupational History   Occupation: retired  Tobacco Use   Smoking status: Former    Current packs/day: 0.00    Types: Cigarettes    Start date: 10/01/1958    Quit date: 10/01/1978    Years since quitting: 44.6   Smokeless tobacco: Never   Tobacco comments:    Former smoker 04/03/22  Vaping Use   Vaping status:  Never Used  Substance and Sexual Activity   Alcohol use: Not Currently    Alcohol/week: 2.0 standard drinks of alcohol    Types: 2 Glasses of wine per week    Comment: None in many years   Drug use: No   Sexual activity: Not on file  Other Topics Concern   Not on file  Social History Narrative   Not on file   Social Determinants of Health   Financial Resource Strain: Not on file  Food Insecurity: Not on file  Transportation Needs: Not on file  Physical Activity: Not on file  Stress: Not on file  Social Connections: Not on file  Intimate Partner Violence: Not on file    FAMILY HISTORY: Family History  Problem Relation Age of Onset   Kidney disease Mother    Prostate cancer Father    Kidney disease Brother    Colon cancer Neg Hx    Esophageal cancer Neg Hx    Liver cancer Neg Hx    Pancreatic cancer Neg Hx    Rectal cancer Neg Hx    Stomach cancer Neg Hx     ALLERGIES:  is allergic to zithromax [azithromycin].  MEDICATIONS:  Current Outpatient Medications  Medication Sig Dispense Refill   acyclovir (ZOVIRAX) 400 MG tablet TAKE 1 TABLET BY MOUTH TWICE A DAY 180 tablet 1   allopurinol (ZYLOPRIM) 300 MG tablet TAKE 1 TABLET BY MOUTH DAILY 100 tablet 2   ALPRAZolam (XANAX) 0.25 MG tablet Take 0.25 mg by mouth 2 (two) times daily as needed for anxiety.     amiodarone (PACERONE) 200 MG tablet TAKE 1 TABLET BY MOUTH EVERY DAY 90 tablet 3   amLODipine (NORVASC) 5 MG tablet TAKE 1 TABLET (5 MG TOTAL) BY MOUTH DAILY. 90 tablet 3   apixaban (ELIQUIS) 5 MG TABS tablet Take 1 tablet (5 mg total) by mouth 2 (two) times daily. 180 tablet 1   dexamethasone (DECADRON) 4 MG tablet TAKE 10 TABLETS (40 MG TOTAL) BY MOUTH ONCE A WEEK IN THE MORNING ON CHEMOTHERAPY DAYS 120 tablet 1   ferrous gluconate (FERGON) 324 MG tablet Take 1 tablet (324 mg total) by mouth 3 (three) times daily with meals. 90 tablet 0   fluticasone (FLONASE) 50 MCG/ACT nasal spray Place 2 sprays into the nose daily  as needed for allergies.     gabapentin (NEURONTIN) 300 MG capsule Take 1 capsule (300 mg total) by mouth 2 (two) times daily. 60 capsule 1   levothyroxine (SYNTHROID) 100 MCG tablet Take 100 mcg by mouth See admin instructions. Take 1 tablet (100 mcg) by mouth on Mondays, Tuesdays, Wednesdays, Thursdays, Fridays & Saturdays in the morning. Skip a dose on Sundays.     loratadine (CLARITIN) 10 MG tablet Take 10 mg by mouth daily as needed for allergies.     meclizine (ANTIVERT) 25 MG tablet Take 1 tablet (25 mg total) by mouth 3 (three) times  daily as needed for dizziness. 30 tablet 0   metoprolol succinate (TOPROL-XL) 25 MG 24 hr tablet Take 1 tablet (25 mg total) by mouth daily. Take with or immediately following a meal. 30 tablet 3   Olmesartan-amLODIPine-HCTZ 20-5-12.5 MG TABS Take 1 tablet by mouth daily.     prochlorperazine (COMPAZINE) 10 MG tablet Take 1 tablet (10 mg total) by mouth every 6 (six) hours as needed for nausea or vomiting. 30 tablet 0   rosuvastatin (CRESTOR) 20 MG tablet 1 tablet Orally Once a day for 90 days     senna-docusate (SENOKOT-S) 8.6-50 MG tablet Take 1 tablet by mouth at bedtime as needed for mild constipation.     traMADol (ULTRAM) 50 MG tablet Take 1 tablet (50 mg total) by mouth every 6 (six) hours as needed. 30 tablet 0   vitamin B-12 (CYANOCOBALAMIN) 500 MCG tablet Take 500 mcg by mouth in the morning.     Current Facility-Administered Medications  Medication Dose Route Frequency Provider Last Rate Last Admin   0.9 %  sodium chloride infusion  500 mL Intravenous Once Armbruster, Willaim Rayas, MD       0.9 %  sodium chloride infusion  500 mL Intravenous Once Armbruster, Willaim Rayas, MD       Facility-Administered Medications Ordered in Other Visits  Medication Dose Route Frequency Provider Last Rate Last Admin   bortezomib SQ (VELCADE) chemo injection (2.5mg /mL concentration) 3 mg  1.3 mg/m2 (Treatment Plan Recorded) Subcutaneous Once Jaci Standard, MD         REVIEW OF SYSTEMS:   Constitutional: ( - ) fevers, ( - )  chills , ( - ) night sweats Eyes: ( - ) blurriness of vision, ( - ) double vision, ( - ) watery eyes Ears, nose, mouth, throat, and face: ( - ) mucositis, ( - ) sore throat Respiratory: ( - ) cough, ( - ) dyspnea, ( - ) wheezes Cardiovascular: ( - ) palpitation, ( - ) chest discomfort, (+ ) lower extremity swelling Gastrointestinal:  ( - ) nausea, ( - ) heartburn, ( - ) change in bowel habits Skin: ( - ) abnormal skin rashes Lymphatics: ( - ) new lymphadenopathy, ( - ) easy bruising Neurological: ( + ) numbness, ( - ) tingling, ( - ) new weaknesses Behavioral/Psych: ( - ) mood change, ( - ) new changes  All other systems were reviewed with the patient and are negative.  PHYSICAL EXAMINATION: ECOG PERFORMANCE STATUS: 1 - Symptomatic but completely ambulatory  Vitals:   05/18/23 0848  BP: (!) 142/69  Pulse: (!) 58  Resp: 16  Temp: 98.2 F (36.8 C)  SpO2: 99%    Filed Weights   05/18/23 0848  Weight: 245 lb 1.6 oz (111.2 kg)     GENERAL: Chronically ill-appearing elderly African-American male, alert, no distress and comfortable SKIN: skin color, texture, turgor are normal, no rashes or significant lesions EYES: conjunctiva are pink and non-injected, sclera clear LUNGS: clear to auscultation and percussion with normal breathing effort HEART: regular rate & rhythm and no murmurs and + 2 bilateral ankle edema Musculoskeletal: no cyanosis of digits and no clubbing  PSYCH: alert & oriented x 3, fluent speech NEURO: no focal motor/sensory deficits  LABORATORY DATA:  I have reviewed the data as listed    Latest Ref Rng & Units 05/18/2023    8:11 AM 05/04/2023    7:30 AM 04/20/2023    8:19 AM  CBC  WBC 4.0 - 10.5  K/uL 5.1  4.4  4.4   Hemoglobin 13.0 - 17.0 g/dL 76.1  60.7  37.1   Hematocrit 39.0 - 52.0 % 32.3  33.0  32.1   Platelets 150 - 400 K/uL 214  193  202        Latest Ref Rng & Units 05/18/2023    8:11 AM  05/04/2023    7:30 AM 04/20/2023    8:19 AM  CMP  Glucose 70 - 99 mg/dL 93  062  694   BUN 8 - 23 mg/dL 20  22  25    Creatinine 0.61 - 1.24 mg/dL 8.54  6.27  0.35   Sodium 135 - 145 mmol/L 141  140  143   Potassium 3.5 - 5.1 mmol/L 4.5  4.0  4.0   Chloride 98 - 111 mmol/L 109  107  111   CO2 22 - 32 mmol/L 27  25  25    Calcium 8.9 - 10.3 mg/dL 9.2  9.3  8.7   Total Protein 6.5 - 8.1 g/dL 6.5  6.7  6.5   Total Bilirubin 0.3 - 1.2 mg/dL 0.4  0.5  0.5   Alkaline Phos 38 - 126 U/L 53  52  49   AST 15 - 41 U/L 22  23  23    ALT 0 - 44 U/L 21  20  22      Lab Results  Component Value Date   MPROTEIN Not Observed 05/04/2023   MPROTEIN Not Observed 04/20/2023   MPROTEIN Not Observed 04/06/2023   Lab Results  Component Value Date   KPAFRELGTCHN 14.2 05/04/2023   KPAFRELGTCHN 13.1 04/20/2023   KPAFRELGTCHN 14.7 04/06/2023   LAMBDASER 11.9 05/04/2023   LAMBDASER 12.8 04/20/2023   LAMBDASER 12.6 04/06/2023   KAPLAMBRATIO 1.19 05/04/2023   KAPLAMBRATIO 1.02 04/20/2023   KAPLAMBRATIO 1.17 04/06/2023    RADIOGRAPHIC STUDIES: No results found.  ASSESSMENT & PLAN Darren Hinton. Is a 78 y.o. male with medical history significant for IgA lambda multiple myeloma who presents for a follow up visit. .    # IgA lambda Multiple Myeloma -- Bone marrow biopsy performed on 01/16/2022 showed increased plasma cells consistent with a plasma cell neoplasm.  Normal FISH panel. --Patient meets diagnostic criteria based on kidney dysfunction and anemia. --Cycle 1 Day 1 of CyBorD chemotherapy started on 02/07/2022 --Velcade maintenance therapy started on 09/09/2022.  Plan: --Most recent SPEP from 05/04/2023 showed M protein not detected,  kappa 14.2, hemoglobin 11.9, ratio 1.19 --Labs today show white blood cell 5.1, hemoglobin 10.4, MCV 104.5, and platelets of 214 creatinine overall stable at 2.41. Calcium level is normal. LFTs normal. Myeloma panel pending.  --continue on maintenance Velcade every 2  weeks with monthly clinic visits.  # Leg Pain -- Likely a component of worsening neuropathy from his chemotherapy with pre-existing sciatica pain. -- continue gabapentin to 300 mg twice daily -- continue tramadol 50 mg every 6 hours as needed -- patient connected with Dr. Barbaraann Cao  #Supportive Care -- chemotherapy education complete -- port placement not required.  -- zofran 8mg  q8H PRN and compazine 10mg  PO q6H for nausea -- acyclovir 400mg  PO BID for VCZ prophylaxis --zometa to start after dental clearance.   Orders Placed This Encounter  Procedures   Multiple Myeloma Panel (SPEP&IFE w/QIG)    Standing Status:   Future    Standing Expiration Date:   05/31/2024   Kappa/lambda light chains    Standing Status:   Future    Standing Expiration Date:  05/31/2024   CBC with Differential (Cancer Center Only)    Standing Status:   Future    Standing Expiration Date:   05/31/2024   CMP (Cancer Center only)    Standing Status:   Future    Standing Expiration Date:   05/31/2024   Multiple Myeloma Panel (SPEP&IFE w/QIG)    Standing Status:   Future    Standing Expiration Date:   06/14/2024   Kappa/lambda light chains    Standing Status:   Future    Standing Expiration Date:   06/14/2024   CBC with Differential (Cancer Center Only)    Standing Status:   Future    Standing Expiration Date:   06/14/2024   CMP (Cancer Center only)    Standing Status:   Future    Standing Expiration Date:   06/14/2024   Multiple Myeloma Panel (SPEP&IFE w/QIG)    Standing Status:   Future    Standing Expiration Date:   06/28/2024   Kappa/lambda light chains    Standing Status:   Future    Standing Expiration Date:   06/28/2024   CBC with Differential (Cancer Center Only)    Standing Status:   Future    Standing Expiration Date:   06/28/2024   CMP (Cancer Center only)    Standing Status:   Future    Standing Expiration Date:   06/28/2024   Multiple Myeloma Panel (SPEP&IFE w/QIG)    Standing Status:    Future    Standing Expiration Date:   07/12/2024   Kappa/lambda light chains    Standing Status:   Future    Standing Expiration Date:   07/12/2024   CBC with Differential (Cancer Center Only)    Standing Status:   Future    Standing Expiration Date:   07/12/2024   CMP (Cancer Center only)    Standing Status:   Future    Standing Expiration Date:   07/12/2024   Multiple Myeloma Panel (SPEP&IFE w/QIG)    Standing Status:   Future    Standing Expiration Date:   07/26/2024   Kappa/lambda light chains    Standing Status:   Future    Standing Expiration Date:   07/26/2024   CBC with Differential (Cancer Center Only)    Standing Status:   Future    Standing Expiration Date:   07/26/2024   CMP (Cancer Center only)    Standing Status:   Future    Standing Expiration Date:   07/26/2024   Multiple Myeloma Panel (SPEP&IFE w/QIG)    Standing Status:   Future    Standing Expiration Date:   08/09/2024   Kappa/lambda light chains    Standing Status:   Future    Standing Expiration Date:   08/09/2024   CBC with Differential (Cancer Center Only)    Standing Status:   Future    Standing Expiration Date:   08/09/2024   CMP (Cancer Center only)    Standing Status:   Future    Standing Expiration Date:   08/09/2024   All questions were answered. The patient knows to call the clinic with any problems, questions or concerns.  I have spent a total of 30 minutes minutes of face-to-face and non-face-to-face time, preparing to see the patient, performing a medically appropriate examination, counseling and educating the patient, documenting clinical information in the electronic health record,  and care coordination.   Ulysees Barns, MD Department of Hematology/Oncology [facility] Phone: [phone]  Pager: [phone] Email: [email or web address]    05/18/2023 9:53 AM

## 2023-05-18 NOTE — Patient Instructions (Signed)
[facility]  Discharge Instructions: Thank you for choosing [facility] to provide your oncology and hematology care.   If you have a lab appointment with the Cancer Center, please go directly to the Cancer Center and check in at the registration area.   Wear comfortable clothing and clothing appropriate for easy access to any Portacath or PICC line.   We strive to give you quality time with your provider. You may need to reschedule your appointment if you arrive late (15 or more minutes).  Arriving late affects you and other patients whose appointments are after yours.  Also, if you miss three or more appointments without notifying the office, you may be dismissed from the clinic at the provider's discretion.      For prescription refill requests, have your pharmacy contact our office and allow 72 hours for refills to be completed.    Today you received the following chemotherapy and/or immunotherapy agents velcade      To help prevent nausea and vomiting after your treatment, we encourage you to take your nausea medication as directed.  BELOW ARE SYMPTOMS THAT SHOULD BE REPORTED IMMEDIATELY: *FEVER GREATER THAN 100.4 F (38 C) OR HIGHER *CHILLS OR SWEATING *NAUSEA AND VOMITING THAT IS NOT CONTROLLED WITH YOUR NAUSEA MEDICATION *UNUSUAL SHORTNESS OF BREATH *UNUSUAL BRUISING OR BLEEDING *URINARY PROBLEMS (pain or burning when urinating, or frequent urination) *BOWEL PROBLEMS (unusual diarrhea, constipation, pain near the anus) TENDERNESS IN MOUTH AND THROAT WITH OR WITHOUT PRESENCE OF ULCERS (sore throat, sores in mouth, or a toothache) UNUSUAL RASH, SWELLING OR PAIN  UNUSUAL VAGINAL DISCHARGE OR ITCHING   Items with * indicate a potential emergency and should be followed up as soon as possible or go to the Emergency Department if any problems should occur.  Please show the CHEMOTHERAPY ALERT CARD or IMMUNOTHERAPY ALERT CARD at check-in  to the Emergency Department and triage nurse.  Should you have questions after your visit or need to cancel or reschedule your appointment, please contact [facility]  Dept: [phone]  and follow the prompts.  Office hours are 8:00 a.m. to 4:30 p.m. Monday - Friday. Please note that voicemails left after 4:00 p.m. may not be returned until the following business day.  We are closed weekends and major holidays. You have access to a nurse at all times for urgent questions. Please call the main number to the clinic Dept: [phone] and follow the prompts.   For any non-urgent questions, you may also contact your provider using MyChart. We now offer e-Visits for anyone 18 and older to request care online for non-urgent symptoms. For details visit [email or web address].   Also download the MyChart app! Go to the app store, search "MyChart", open the app, select [facility], and log in with your MyChart username and password.   

## 2023-05-19 ENCOUNTER — Encounter: Payer: Self-pay | Admitting: Gastroenterology

## 2023-05-19 LAB — KAPPA/LAMBDA LIGHT CHAINS
Kappa free light chain: 16 mg/L (ref 3.3–19.4)
Kappa, lambda light chain ratio: 1.22 (ref 0.26–1.65)
Lambda free light chains: 13.1 mg/L (ref 5.7–26.3)

## 2023-05-20 ENCOUNTER — Other Ambulatory Visit: Payer: Self-pay

## 2023-05-21 ENCOUNTER — Encounter: Payer: Self-pay | Admitting: Hematology and Oncology

## 2023-05-26 ENCOUNTER — Other Ambulatory Visit: Payer: Self-pay

## 2023-05-26 LAB — MULTIPLE MYELOMA PANEL, SERUM
Albumin SerPl Elph-Mcnc: 3.6 g/dL (ref 2.9–4.4)
Albumin/Glob SerPl: 1.6 (ref 0.7–1.7)
Alpha 1: 0.2 g/dL (ref 0.0–0.4)
Alpha2 Glob SerPl Elph-Mcnc: 0.7 g/dL (ref 0.4–1.0)
B-Globulin SerPl Elph-Mcnc: 0.9 g/dL (ref 0.7–1.3)
Gamma Glob SerPl Elph-Mcnc: 0.4 g/dL (ref 0.4–1.8)
Globulin, Total: 2.3 g/dL (ref 2.2–3.9)
IgA: 28 mg/dL — ABNORMAL LOW (ref 61–437)
IgG (Immunoglobin G), Serum: 470 mg/dL — ABNORMAL LOW (ref 603–1613)
IgM (Immunoglobulin M), Srm: 35 mg/dL (ref 15–143)
Total Protein ELP: 5.9 g/dL — ABNORMAL LOW (ref 6.0–8.5)

## 2023-06-01 ENCOUNTER — Inpatient Hospital Stay: Payer: No Typology Code available for payment source

## 2023-06-01 VITALS — BP 139/67 | HR 58 | Temp 98.3°F | Resp 18 | Wt 242.0 lb

## 2023-06-01 DIAGNOSIS — Z5112 Encounter for antineoplastic immunotherapy: Secondary | ICD-10-CM | POA: Diagnosis not present

## 2023-06-01 DIAGNOSIS — C9000 Multiple myeloma not having achieved remission: Secondary | ICD-10-CM

## 2023-06-01 LAB — CBC WITH DIFFERENTIAL (CANCER CENTER ONLY)
Abs Immature Granulocytes: 0.02 10*3/uL (ref 0.00–0.07)
Basophils Absolute: 0.1 10*3/uL (ref 0.0–0.1)
Basophils Relative: 1 %
Eosinophils Absolute: 0.2 10*3/uL (ref 0.0–0.5)
Eosinophils Relative: 4 %
HCT: 31.9 % — ABNORMAL LOW (ref 39.0–52.0)
Hemoglobin: 10.3 g/dL — ABNORMAL LOW (ref 13.0–17.0)
Immature Granulocytes: 0 %
Lymphocytes Relative: 25 %
Lymphs Abs: 1.3 10*3/uL (ref 0.7–4.0)
MCH: 34 pg (ref 26.0–34.0)
MCHC: 32.3 g/dL (ref 30.0–36.0)
MCV: 105.3 fL — ABNORMAL HIGH (ref 80.0–100.0)
Monocytes Absolute: 0.7 10*3/uL (ref 0.1–1.0)
Monocytes Relative: 15 %
Neutro Abs: 2.8 10*3/uL (ref 1.7–7.7)
Neutrophils Relative %: 55 %
Platelet Count: 184 10*3/uL (ref 150–400)
RBC: 3.03 MIL/uL — ABNORMAL LOW (ref 4.22–5.81)
RDW: 14.9 % (ref 11.5–15.5)
WBC Count: 5.1 10*3/uL (ref 4.0–10.5)
nRBC: 0 % (ref 0.0–0.2)

## 2023-06-01 LAB — CMP (CANCER CENTER ONLY)
ALT: 24 U/L (ref 0–44)
AST: 24 U/L (ref 15–41)
Albumin: 4.3 g/dL (ref 3.5–5.0)
Alkaline Phosphatase: 57 U/L (ref 38–126)
Anion gap: 6 (ref 5–15)
BUN: 23 mg/dL (ref 8–23)
CO2: 26 mmol/L (ref 22–32)
Calcium: 9.3 mg/dL (ref 8.9–10.3)
Chloride: 109 mmol/L (ref 98–111)
Creatinine: 2.51 mg/dL — ABNORMAL HIGH (ref 0.61–1.24)
GFR, Estimated: 26 mL/min — ABNORMAL LOW (ref >60)
Glucose, Bld: 98 mg/dL (ref 70–99)
Potassium: 4.1 mmol/L (ref 3.5–5.1)
Sodium: 141 mmol/L (ref 135–145)
Total Bilirubin: 0.5 mg/dL (ref 0.3–1.2)
Total Protein: 6.8 g/dL (ref 6.5–8.1)

## 2023-06-01 MED ORDER — BORTEZOMIB CHEMO SQ INJECTION 3.5 MG (2.5MG/ML)
1.3000 mg/m2 | Freq: Once | INTRAMUSCULAR | Status: AC
  Administered 2023-06-01: 3 mg via SUBCUTANEOUS
  Filled 2023-06-01: qty 1.2

## 2023-06-01 MED ORDER — PROCHLORPERAZINE MALEATE 10 MG PO TABS
10.0000 mg | ORAL_TABLET | Freq: Once | ORAL | Status: AC
  Administered 2023-06-01: 10 mg via ORAL
  Filled 2023-06-01: qty 1

## 2023-06-01 NOTE — Progress Notes (Signed)
Per Leonides Schanz MD, ok to treat today with SCR 2.51

## 2023-06-01 NOTE — Patient Instructions (Signed)
[facility]  Discharge Instructions: Thank you for choosing [facility] to provide your oncology and hematology care.   If you have a lab appointment with the Cancer Center, please go directly to the Cancer Center and check in at the registration area.   Wear comfortable clothing and clothing appropriate for easy access to any Portacath or PICC line.   We strive to give you quality time with your provider. You may need to reschedule your appointment if you arrive late (15 or more minutes).  Arriving late affects you and other patients whose appointments are after yours.  Also, if you miss three or more appointments without notifying the office, you may be dismissed from the clinic at the provider's discretion.      For prescription refill requests, have your pharmacy contact our office and allow 72 hours for refills to be completed.    Today you received the following chemotherapy and/or immunotherapy agents: Velcade        To help prevent nausea and vomiting after your treatment, we encourage you to take your nausea medication as directed.  BELOW ARE SYMPTOMS THAT SHOULD BE REPORTED IMMEDIATELY: *FEVER GREATER THAN 100.4 F (38 C) OR HIGHER *CHILLS OR SWEATING *NAUSEA AND VOMITING THAT IS NOT CONTROLLED WITH YOUR NAUSEA MEDICATION *UNUSUAL SHORTNESS OF BREATH *UNUSUAL BRUISING OR BLEEDING *URINARY PROBLEMS (pain or burning when urinating, or frequent urination) *BOWEL PROBLEMS (unusual diarrhea, constipation, pain near the anus) TENDERNESS IN MOUTH AND THROAT WITH OR WITHOUT PRESENCE OF ULCERS (sore throat, sores in mouth, or a toothache) UNUSUAL RASH, SWELLING OR PAIN  UNUSUAL VAGINAL DISCHARGE OR ITCHING   Items with * indicate a potential emergency and should be followed up as soon as possible or go to the Emergency Department if any problems should occur.  Please show the CHEMOTHERAPY ALERT CARD or IMMUNOTHERAPY ALERT CARD at  check-in to the Emergency Department and triage nurse.  Should you have questions after your visit or need to cancel or reschedule your appointment, please contact [facility]  Dept: [phone]  and follow the prompts.  Office hours are 8:00 a.m. to 4:30 p.m. Monday - Friday. Please note that voicemails left after 4:00 p.m. may not be returned until the following business day.  We are closed weekends and major holidays. You have access to a nurse at all times for urgent questions. Please call the main number to the clinic Dept: [phone] and follow the prompts.   For any non-urgent questions, you may also contact your provider using MyChart. We now offer e-Visits for anyone 18 and older to request care online for non-urgent symptoms. For details visit [email or web address].   Also download the MyChart app! Go to the app store, search "MyChart", open the app, select [facility], and log in with your MyChart username and password.  

## 2023-06-02 LAB — KAPPA/LAMBDA LIGHT CHAINS
Kappa free light chain: 17.4 mg/L (ref 3.3–19.4)
Kappa, lambda light chain ratio: 0.66 (ref 0.26–1.65)
Lambda free light chains: 26.3 mg/L (ref 5.7–26.3)

## 2023-06-05 LAB — MULTIPLE MYELOMA PANEL, SERUM
Albumin SerPl Elph-Mcnc: 3.6 g/dL (ref 2.9–4.4)
Albumin/Glob SerPl: 1.6 (ref 0.7–1.7)
Alpha 1: 0.3 g/dL (ref 0.0–0.4)
Alpha2 Glob SerPl Elph-Mcnc: 0.7 g/dL (ref 0.4–1.0)
B-Globulin SerPl Elph-Mcnc: 0.9 g/dL (ref 0.7–1.3)
Gamma Glob SerPl Elph-Mcnc: 0.4 g/dL (ref 0.4–1.8)
Globulin, Total: 2.3 g/dL (ref 2.2–3.9)
IgA: 30 mg/dL — ABNORMAL LOW (ref 61–437)
IgG (Immunoglobin G), Serum: 481 mg/dL — ABNORMAL LOW (ref 603–1613)
IgM (Immunoglobulin M), Srm: 42 mg/dL (ref 15–143)
Total Protein ELP: 5.9 g/dL — ABNORMAL LOW (ref 6.0–8.5)

## 2023-06-15 ENCOUNTER — Inpatient Hospital Stay
Payer: No Typology Code available for payment source | Attending: Hematology and Oncology | Admitting: Hematology and Oncology

## 2023-06-15 ENCOUNTER — Inpatient Hospital Stay: Payer: No Typology Code available for payment source | Attending: Hematology and Oncology

## 2023-06-15 ENCOUNTER — Inpatient Hospital Stay: Payer: No Typology Code available for payment source

## 2023-06-15 VITALS — BP 121/69 | HR 52 | Temp 97.9°F | Resp 16 | Ht 77.0 in | Wt 238.0 lb

## 2023-06-15 DIAGNOSIS — Z79899 Other long term (current) drug therapy: Secondary | ICD-10-CM | POA: Insufficient documentation

## 2023-06-15 DIAGNOSIS — Z881 Allergy status to other antibiotic agents status: Secondary | ICD-10-CM | POA: Insufficient documentation

## 2023-06-15 DIAGNOSIS — Z79624 Long term (current) use of inhibitors of nucleotide synthesis: Secondary | ICD-10-CM | POA: Diagnosis not present

## 2023-06-15 DIAGNOSIS — Z7969 Long term (current) use of other immunomodulators and immunosuppressants: Secondary | ICD-10-CM | POA: Insufficient documentation

## 2023-06-15 DIAGNOSIS — I480 Paroxysmal atrial fibrillation: Secondary | ICD-10-CM | POA: Insufficient documentation

## 2023-06-15 DIAGNOSIS — Z860102 Personal history of hyperplastic colon polyps: Secondary | ICD-10-CM | POA: Insufficient documentation

## 2023-06-15 DIAGNOSIS — Z7901 Long term (current) use of anticoagulants: Secondary | ICD-10-CM | POA: Insufficient documentation

## 2023-06-15 DIAGNOSIS — C9000 Multiple myeloma not having achieved remission: Secondary | ICD-10-CM

## 2023-06-15 DIAGNOSIS — R251 Tremor, unspecified: Secondary | ICD-10-CM | POA: Insufficient documentation

## 2023-06-15 DIAGNOSIS — Z5112 Encounter for antineoplastic immunotherapy: Secondary | ICD-10-CM | POA: Insufficient documentation

## 2023-06-15 DIAGNOSIS — R059 Cough, unspecified: Secondary | ICD-10-CM | POA: Insufficient documentation

## 2023-06-15 DIAGNOSIS — D649 Anemia, unspecified: Secondary | ICD-10-CM | POA: Diagnosis not present

## 2023-06-15 DIAGNOSIS — Z87891 Personal history of nicotine dependence: Secondary | ICD-10-CM | POA: Insufficient documentation

## 2023-06-15 DIAGNOSIS — R202 Paresthesia of skin: Secondary | ICD-10-CM | POA: Insufficient documentation

## 2023-06-15 LAB — CBC WITH DIFFERENTIAL (CANCER CENTER ONLY)
Abs Immature Granulocytes: 0.01 10*3/uL (ref 0.00–0.07)
Basophils Absolute: 0 10*3/uL (ref 0.0–0.1)
Basophils Relative: 0 %
Eosinophils Absolute: 0.4 10*3/uL (ref 0.0–0.5)
Eosinophils Relative: 7 %
HCT: 32.5 % — ABNORMAL LOW (ref 39.0–52.0)
Hemoglobin: 10.5 g/dL — ABNORMAL LOW (ref 13.0–17.0)
Immature Granulocytes: 0 %
Lymphocytes Relative: 20 %
Lymphs Abs: 1 10*3/uL (ref 0.7–4.0)
MCH: 33.4 pg (ref 26.0–34.0)
MCHC: 32.3 g/dL (ref 30.0–36.0)
MCV: 103.5 fL — ABNORMAL HIGH (ref 80.0–100.0)
Monocytes Absolute: 0.5 10*3/uL (ref 0.1–1.0)
Monocytes Relative: 10 %
Neutro Abs: 3 10*3/uL (ref 1.7–7.7)
Neutrophils Relative %: 63 %
Platelet Count: 196 10*3/uL (ref 150–400)
RBC: 3.14 MIL/uL — ABNORMAL LOW (ref 4.22–5.81)
RDW: 14.8 % (ref 11.5–15.5)
WBC Count: 4.9 10*3/uL (ref 4.0–10.5)
nRBC: 0 % (ref 0.0–0.2)

## 2023-06-15 LAB — CMP (CANCER CENTER ONLY)
ALT: 37 U/L (ref 0–44)
AST: 31 U/L (ref 15–41)
Albumin: 4.3 g/dL (ref 3.5–5.0)
Alkaline Phosphatase: 57 U/L (ref 38–126)
Anion gap: 6 (ref 5–15)
BUN: 20 mg/dL (ref 8–23)
CO2: 26 mmol/L (ref 22–32)
Calcium: 9.3 mg/dL (ref 8.9–10.3)
Chloride: 108 mmol/L (ref 98–111)
Creatinine: 2.5 mg/dL — ABNORMAL HIGH (ref 0.61–1.24)
GFR, Estimated: 26 mL/min — ABNORMAL LOW (ref >60)
Glucose, Bld: 117 mg/dL — ABNORMAL HIGH (ref 70–99)
Potassium: 4 mmol/L (ref 3.5–5.1)
Sodium: 140 mmol/L (ref 135–145)
Total Bilirubin: 0.5 mg/dL (ref 0.3–1.2)
Total Protein: 6.7 g/dL (ref 6.5–8.1)

## 2023-06-15 MED ORDER — BORTEZOMIB CHEMO SQ INJECTION 3.5 MG (2.5MG/ML)
1.3000 mg/m2 | Freq: Once | INTRAMUSCULAR | Status: AC
  Administered 2023-06-15: 3 mg via SUBCUTANEOUS
  Filled 2023-06-15: qty 1.2

## 2023-06-15 MED ORDER — PROCHLORPERAZINE MALEATE 10 MG PO TABS
10.0000 mg | ORAL_TABLET | Freq: Once | ORAL | Status: AC
  Administered 2023-06-15: 10 mg via ORAL
  Filled 2023-06-15: qty 1

## 2023-06-15 NOTE — Progress Notes (Signed)
[facility] [phone]   [phone]  PROGRESS NOTE  Patient Care Team: [name], MD as PCP - General (Internal Medicine)  Hematological/Oncological History # IgA Lambda Multiple Myeloma 05/03/2015: last visit with Dr. Arbutus Ped at the [facility]. Was followed for IgA lambda MGUS. 12/11/2021: labs show M protein 3.4, Kappa 19.2, Lambda 1576.4, ratio 0.01. Cr 3.47, Hgb 8.6, WBC 5.7, MCV 99, Plt 221 12/23/2021: establish care with Dr. Leonides Schanz  01/16/2022: Bone marrow biopsy performed, showed a 60% cellular bone marrow predominantly comprised of plasma cells making up 70 to 80%, lambda restricted.  Myeloma FISH panel showed no evidence of abnormalities. 02/07/2022: Cycle 1 Day 1 of CyBorD chemotherapy.  03/09/2022-03/17/2022: hospitalized for fever/pneumonia.  03/24/2022: Cycle 2 Day 1 of CyBorD chemotherapy.  04/22/2022: Cycle 3 Day 1 of CyBorD chemotherapy 05/19/2022-05/26/2022: Delayed start of Cycle 4 Day 1 of CyBorD chemotherapy due to neutropenia.  06/03/2022: Cycle 4 Day 1 of CyBorD chemotherapy. 25% dose reduction of cyclophosphamide due to cytopenias. 06/30/2022: Cycle 4 Day 22. Drop Velcade dose to 1mg /m2 and Cyclophosphamide 200 mg/m2  07/07/2022: Cycle 5 Day 1 of CyBorD chemotherapy with dose reductions.  08/04/2022: Cycle 6 Day 1 of CyBorD chemotherapy with dose reductions.  09/09/2022: transition to maintenance dose Velcade q 2 weeks.   Interval History:  Darren Hinton. 77 y.o. male with medical history significant for IgA lambda multiple myeloma who presents for a follow up visit. The patient's last visit was on 05/18/2023. In the interim since the last visit he has continued on maintenance Velcade.   On exam today Darren Hinton is accompanied by his wife today.  He reports that he has been feeling good overall though he does currently have a cold.  He was started on antibiotic therapy for having a cough and runny nose.  He is taking antibiotic  for 7 days twice daily.  He reports he does feel like he is improving.  He tested negative for COVID.  His cough is now thickening up and producing a yellow sputum.  He feels like the antibiotics are working.  He notes he is tolerating his Velcade shots well with no major side effects.  His bowels are normal and he is not having any nausea, vomiting, or diarrhea.  He reports he does have some tingling in his feet still and some occasional balance issues, but he has had no trips or falls.  He does also have a slight tremor.  He notes otherwise his appetite is good and he is doing his best to maintain hydration.  He notes he is willing and able to proceed with Velcade at this time.  He denies fevers, chills, sweats, shortness of breath, chest pain or cough. He has no other complaints. Rest of the 10 point ROS is below.   MEDICAL HISTORY:  Past Medical History:  Diagnosis Date   Allergy    Anxiety    Arthritis    Depression    Heart murmur    Hyperlipidemia    Hyperplastic colon polyp    Hyperproteinemia    Hypertension    Hypothyroidism    Kidney disease    Mitral valve prolapse    Multiple myeloma (HCC)    Paroxysmal atrial fibrillation (HCC)    Thyroid disease     SURGICAL HISTORY: Past Surgical History:  Procedure Laterality Date   CARDIOVERSION N/A 03/14/2022   Procedure: CARDIOVERSION;  Surgeon: Chrystie Nose, MD;  Location: [facility] END[facility]COPY;  Service: Cardiovascular;  Laterality: N/A;   TEE WITHOUT CARDIOVERSION N/A 03/14/2022   Procedure: TRANSESOPHAGEAL ECHOCARDIOGRAM (TEE);  Surgeon: Chrystie Nose, MD;  Location: [facility] ENDOSCOPY;  Service: Cardiovascular;  Laterality: N/A;    SOCIAL HISTORY: Social History   Socioeconomic History   Marital status: Married    Spouse name: Bonita Quin   Number of children: 2   Years of education: Not on file   Highest education level: Not on file  Occupational History   Occupation: retired  Tobacco Use   Smoking status: Former    Current  packs/day: 0.00    Types: Cigarettes    Start date: 10/01/1958    Quit date: 10/01/1978    Years since quitting: 44.7   Smokeless tobacco: Never   Tobacco comments:    Former smoker 04/03/22  Vaping Use   Vaping status: Never Used  Substance and Sexual Activity   Alcohol use: Not Currently    Alcohol/week: 2.0 standard drinks of alcohol    Types: 2 Glasses of wine per week    Comment: None in many years   Drug use: No   Sexual activity: Not on file  Other Topics Concern   Not on file  Social History Narrative   Not on file   Social Determinants of Health   Financial Resource Strain: Not on file  Food Insecurity: Not on file  Transportation Needs: Not on file  Physical Activity: Not on file  Stress: Not on file  Social Connections: Not on file  Intimate Partner Violence: Not on file    FAMILY HISTORY: Family History  Problem Relation Age of Onset   Kidney disease Mother    Prostate cancer Father    Kidney disease Brother    Colon cancer Neg Hx    Esophageal cancer Neg Hx    Liver cancer Neg Hx    Pancreatic cancer Neg Hx    Rectal cancer Neg Hx    Stomach cancer Neg Hx     ALLERGIES:  is allergic to zithromax [azithromycin].  MEDICATIONS:  Current Outpatient Medications  Medication Sig Dispense Refill   acyclovir (ZOVIRAX) 400 MG tablet TAKE 1 TABLET BY MOUTH TWICE A DAY 180 tablet 1   allopurinol (ZYLOPRIM) 300 MG tablet TAKE 1 TABLET BY MOUTH DAILY 100 tablet 2   ALPRAZolam (XANAX) 0.25 MG tablet Take 0.25 mg by mouth 2 (two) times daily as needed for anxiety.     amiodarone (PACERONE) 200 MG tablet TAKE 1 TABLET BY MOUTH EVERY DAY 90 tablet 3   amLODipine (NORVASC) 5 MG tablet TAKE 1 TABLET (5 MG TOTAL) BY MOUTH DAILY. 90 tablet 3   apixaban (ELIQUIS) 5 MG TABS tablet Take 1 tablet (5 mg total) by mouth 2 (two) times daily. 180 tablet 1   dexamethasone (DECADRON) 4 MG tablet TAKE 10 TABLETS (40 MG TOTAL) BY MOUTH ONCE A WEEK IN THE MORNING ON CHEMOTHERAPY  DAYS 120 tablet 1   ferrous gluconate (FERGON) 324 MG tablet Take 1 tablet (324 mg total) by mouth 3 (three) times daily with meals. 90 tablet 0   fluticasone (FLONASE) 50 MCG/ACT nasal spray Place 2 sprays into the nose daily as needed for allergies.     gabapentin (NEURONTIN) 300 MG capsule Take 1 capsule (300 mg total) by mouth 2 (two) times daily. 60 capsule 1   levothyroxine (SYNTHROID) 100 MCG tablet Take 100 mcg by mouth See admin instructions. Take 1 tablet (100 mcg) by mouth on Mondays, Tuesdays, Wednesdays, Thursdays, Fridays & Saturdays in the  morning. Skip a dose on Sundays.     loratadine (CLARITIN) 10 MG tablet Take 10 mg by mouth daily as needed for allergies.     meclizine (ANTIVERT) 25 MG tablet Take 1 tablet (25 mg total) by mouth 3 (three) times daily as needed for dizziness. 30 tablet 0   metoprolol succinate (TOPROL-XL) 25 MG 24 hr tablet Take 1 tablet (25 mg total) by mouth daily. Take with or immediately following a meal. 30 tablet 3   Olmesartan-amLODIPine-HCTZ 20-5-12.5 MG TABS Take 1 tablet by mouth daily.     prochlorperazine (COMPAZINE) 10 MG tablet Take 1 tablet (10 mg total) by mouth every 6 (six) hours as needed for nausea or vomiting. 30 tablet 0   rosuvastatin (CRESTOR) 20 MG tablet 1 tablet Orally Once a day for 90 days     senna-docusate (SENOKOT-S) 8.6-50 MG tablet Take 1 tablet by mouth at bedtime as needed for mild constipation.     traMADol (ULTRAM) 50 MG tablet Take 1 tablet (50 mg total) by mouth every 6 (six) hours as needed. 30 tablet 0   vitamin B-12 (CYANOCOBALAMIN) 500 MCG tablet Take 500 mcg by mouth in the morning.     Current Facility-Administered Medications  Medication Dose Route Frequency Provider Last Rate Last Admin   0.9 %  sodium chloride infusion  500 mL Intravenous Once Armbruster, Willaim Rayas, MD       0.9 %  sodium chloride infusion  500 mL Intravenous Once Armbruster, Willaim Rayas, MD        REVIEW OF SYSTEMS:   Constitutional: ( - ) fevers,  ( - )  chills , ( - ) night sweats Eyes: ( - ) blurriness of vision, ( - ) double vision, ( - ) watery eyes Ears, nose, mouth, throat, and face: ( - ) mucositis, ( - ) sore throat Respiratory: ( - ) cough, ( - ) dyspnea, ( - ) wheezes Cardiovascular: ( - ) palpitation, ( - ) chest discomfort, (+ ) lower extremity swelling Gastrointestinal:  ( - ) nausea, ( - ) heartburn, ( - ) change in bowel habits Skin: ( - ) abnormal skin rashes Lymphatics: ( - ) new lymphadenopathy, ( - ) easy bruising Neurological: ( + ) numbness, ( - ) tingling, ( - ) new weaknesses Behavioral/Psych: ( - ) mood change, ( - ) new changes  All other systems were reviewed with the patient and are negative.  PHYSICAL EXAMINATION: ECOG PERFORMANCE STATUS: 1 - Symptomatic but completely ambulatory  Vitals:   06/15/23 1501  BP: 121/69  Pulse: (!) 52  Resp: 16  Temp: 97.9 F (36.6 C)  SpO2: 98%    Filed Weights   06/15/23 1501  Weight: 238 lb (108 kg)     GENERAL: Chronically ill-appearing elderly African-American male, alert, no distress and comfortable SKIN: skin color, texture, turgor are normal, no rashes or significant lesions EYES: conjunctiva are pink and non-injected, sclera clear LUNGS: clear to auscultation and percussion with normal breathing effort HEART: regular rate & rhythm and no murmurs and + 2 bilateral ankle edema Musculoskeletal: no cyanosis of digits and no clubbing  PSYCH: alert & oriented x 3, fluent speech NEURO: no focal motor/sensory deficits  LABORATORY DATA:  I have reviewed the data as listed    Latest Ref Rng & Units 06/15/2023    2:33 PM 06/01/2023    7:50 AM 05/18/2023    8:11 AM  CBC  WBC 4.0 - 10.5 K/uL 4.9  5.1  5.1   Hemoglobin 13.0 - 17.0 g/dL 16.1  09.6  04.5   Hematocrit 39.0 - 52.0 % 32.5  31.9  32.3   Platelets 150 - 400 K/uL 196  184  214        Latest Ref Rng & Units 06/15/2023    2:33 PM 06/01/2023    7:50 AM 05/18/2023    8:11 AM  CMP  Glucose 70 - 99  mg/dL 409  98  93   BUN 8 - 23 mg/dL 20  23  20    Creatinine 0.61 - 1.24 mg/dL 8.11  9.14  7.82   Sodium 135 - 145 mmol/L 140  141  141   Potassium 3.5 - 5.1 mmol/L 4.0  4.1  4.5   Chloride 98 - 111 mmol/L 108  109  109   CO2 22 - 32 mmol/L 26  26  27    Calcium 8.9 - 10.3 mg/dL 9.3  9.3  9.2   Total Protein 6.5 - 8.1 g/dL 6.7  6.8  6.5   Total Bilirubin 0.3 - 1.2 mg/dL 0.5  0.5  0.4   Alkaline Phos 38 - 126 U/L 57  57  53   AST 15 - 41 U/L 31  24  22    ALT 0 - 44 U/L 37  24  21     Lab Results  Component Value Date   MPROTEIN Not Observed 06/01/2023   MPROTEIN Not Observed 05/18/2023   MPROTEIN Not Observed 05/04/2023   Lab Results  Component Value Date   KPAFRELGTCHN 17.4 06/01/2023   KPAFRELGTCHN 16.0 05/18/2023   KPAFRELGTCHN 14.2 05/04/2023   LAMBDASER 26.3 06/01/2023   LAMBDASER 13.1 05/18/2023   LAMBDASER 11.9 05/04/2023   KAPLAMBRATIO 0.66 06/01/2023   KAPLAMBRATIO 1.22 05/18/2023   KAPLAMBRATIO 1.19 05/04/2023    RADIOGRAPHIC STUDIES: No results found.  ASSESSMENT & PLAN Darren Hinton. Is a 77 y.o. male with medical history significant for IgA lambda multiple myeloma who presents for a follow up visit. .    # IgA lambda Multiple Myeloma -- Bone marrow biopsy performed on 01/16/2022 showed increased plasma cells consistent with a plasma cell neoplasm.  Normal FISH panel. --Patient meets diagnostic criteria based on kidney dysfunction and anemia. --Cycle 1 Day 1 of CyBorD chemotherapy started on 02/07/2022 --Velcade maintenance therapy started on 09/09/2022.  Plan: --Most recent SPEP from 06/01/2023 showed M protein not detected,  kappa 17.4, lambda 26.3 with normal ratio. --Labs today show white blood cell 4.9, Hgb 10.5, MCV 103.5, Plt 196. Creatinine overall stable at 2.50. Calcium level is normal. LFTs normal. Myeloma panel pending.  --continue on maintenance Velcade every 2 weeks with monthly clinic visits.  # Leg Pain -- Likely a component of worsening  neuropathy from his chemotherapy with pre-existing sciatica pain. -- continue gabapentin to 300 mg twice daily -- continue tramadol 50 mg every 6 hours as needed -- patient connected with Dr. Barbaraann Cao  #Supportive Care -- chemotherapy education complete -- port placement not required.  -- zofran 8mg  q8H PRN and compazine 10mg  PO q6H for nausea -- acyclovir 400mg  PO BID for VCZ prophylaxis --zometa to start after dental clearance.   Orders Placed This Encounter  Procedures   Multiple Myeloma Panel (SPEP&IFE w/QIG)    Standing Status:   Future    Standing Expiration Date:   08/23/2024   Kappa/lambda light chains    Standing Status:   Future    Standing Expiration Date:   08/23/2024   CBC  with Differential (Cancer Center Only)    Standing Status:   Future    Standing Expiration Date:   08/23/2024   CMP (Cancer Center only)    Standing Status:   Future    Standing Expiration Date:   08/23/2024   Multiple Myeloma Panel (SPEP&IFE w/QIG)    Standing Status:   Future    Standing Expiration Date:   09/06/2024   Kappa/lambda light chains    Standing Status:   Future    Standing Expiration Date:   09/06/2024   CBC with Differential (Cancer Center Only)    Standing Status:   Future    Standing Expiration Date:   09/06/2024   CMP (Cancer Center only)    Standing Status:   Future    Standing Expiration Date:   09/06/2024   Multiple Myeloma Panel (SPEP&IFE w/QIG)    Standing Status:   Future    Standing Expiration Date:   09/20/2024   Kappa/lambda light chains    Standing Status:   Future    Standing Expiration Date:   09/20/2024   CBC with Differential (Cancer Center Only)    Standing Status:   Future    Standing Expiration Date:   09/20/2024   CMP (Cancer Center only)    Standing Status:   Future    Standing Expiration Date:   09/20/2024   All questions were answered. The patient knows to call the clinic with any problems, questions or concerns.  I have spent a total of 30 minutes  minutes of face-to-face and non-face-to-face time, preparing to see the patient, performing a medically appropriate examination, counseling and educating the patient, documenting clinical information in the electronic health record,  and care coordination.   Ulysees Barns, MD Department of Hematology/Oncology [facility] Phone: [phone] Pager: [phone] Email: [email or web address]    06/15/2023 5:00 PM

## 2023-06-15 NOTE — Patient Instructions (Signed)
[facility]  Discharge Instructions: Thank you for choosing [facility] to provide your oncology and hematology care.   If you have a lab appointment with the Cancer Center, please go directly to the Cancer Center and check in at the registration area.   Wear comfortable clothing and clothing appropriate for easy access to any Portacath or PICC line.   We strive to give you quality time with your provider. You may need to reschedule your appointment if you arrive late (15 or more minutes).  Arriving late affects you and other patients whose appointments are after yours.  Also, if you miss three or more appointments without notifying the office, you may be dismissed from the clinic at the provider's discretion.      For prescription refill requests, have your pharmacy contact our office and allow 72 hours for refills to be completed.    Today you received the following chemotherapy and/or immunotherapy agents: Velcade        To help prevent nausea and vomiting after your treatment, we encourage you to take your nausea medication as directed.  BELOW ARE SYMPTOMS THAT SHOULD BE REPORTED IMMEDIATELY: *FEVER GREATER THAN 100.4 F (38 C) OR HIGHER *CHILLS OR SWEATING *NAUSEA AND VOMITING THAT IS NOT CONTROLLED WITH YOUR NAUSEA MEDICATION *UNUSUAL SHORTNESS OF BREATH *UNUSUAL BRUISING OR BLEEDING *URINARY PROBLEMS (pain or burning when urinating, or frequent urination) *BOWEL PROBLEMS (unusual diarrhea, constipation, pain near the anus) TENDERNESS IN MOUTH AND THROAT WITH OR WITHOUT PRESENCE OF ULCERS (sore throat, sores in mouth, or a toothache) UNUSUAL RASH, SWELLING OR PAIN  UNUSUAL VAGINAL DISCHARGE OR ITCHING   Items with * indicate a potential emergency and should be followed up as soon as possible or go to the Emergency Department if any problems should occur.  Please show the CHEMOTHERAPY ALERT CARD or IMMUNOTHERAPY ALERT CARD at  check-in to the Emergency Department and triage nurse.  Should you have questions after your visit or need to cancel or reschedule your appointment, please contact [facility]  Dept: [phone]  and follow the prompts.  Office hours are 8:00 a.m. to 4:30 p.m. Monday - Friday. Please note that voicemails left after 4:00 p.m. may not be returned until the following business day.  We are closed weekends and major holidays. You have access to a nurse at all times for urgent questions. Please call the main number to the clinic Dept: [phone] and follow the prompts.   For any non-urgent questions, you may also contact your provider using MyChart. We now offer e-Visits for anyone 18 and older to request care online for non-urgent symptoms. For details visit [email or web address].   Also download the MyChart app! Go to the app store, search "MyChart", open the app, select [facility], and log in with your MyChart username and password.  

## 2023-06-15 NOTE — Progress Notes (Signed)
Per Leonides Schanz MD, ok to treat with SCR 2.5

## 2023-06-16 ENCOUNTER — Other Ambulatory Visit: Payer: Self-pay

## 2023-06-16 LAB — KAPPA/LAMBDA LIGHT CHAINS
Kappa free light chain: 16.3 mg/L (ref 3.3–19.4)
Kappa, lambda light chain ratio: 1.11 (ref 0.26–1.65)
Lambda free light chains: 14.7 mg/L (ref 5.7–26.3)

## 2023-06-17 LAB — MULTIPLE MYELOMA PANEL, SERUM
Albumin SerPl Elph-Mcnc: 3.9 g/dL (ref 2.9–4.4)
Albumin/Glob SerPl: 1.9 — ABNORMAL HIGH (ref 0.7–1.7)
Alpha 1: 0.2 g/dL (ref 0.0–0.4)
Alpha2 Glob SerPl Elph-Mcnc: 0.7 g/dL (ref 0.4–1.0)
B-Globulin SerPl Elph-Mcnc: 0.9 g/dL (ref 0.7–1.3)
Gamma Glob SerPl Elph-Mcnc: 0.3 g/dL — ABNORMAL LOW (ref 0.4–1.8)
Globulin, Total: 2.1 g/dL — ABNORMAL LOW (ref 2.2–3.9)
IgA: 32 mg/dL — ABNORMAL LOW (ref 61–437)
IgG (Immunoglobin G), Serum: 472 mg/dL — ABNORMAL LOW (ref 603–1613)
IgM (Immunoglobulin M), Srm: 46 mg/dL (ref 15–143)
Total Protein ELP: 6 g/dL (ref 6.0–8.5)

## 2023-06-24 ENCOUNTER — Other Ambulatory Visit: Payer: Self-pay | Admitting: Hematology and Oncology

## 2023-06-24 ENCOUNTER — Other Ambulatory Visit: Payer: Self-pay | Admitting: Physician Assistant

## 2023-06-28 ENCOUNTER — Other Ambulatory Visit: Payer: Self-pay

## 2023-06-29 ENCOUNTER — Inpatient Hospital Stay: Payer: No Typology Code available for payment source

## 2023-06-29 ENCOUNTER — Inpatient Hospital Stay (HOSPITAL_BASED_OUTPATIENT_CLINIC_OR_DEPARTMENT_OTHER): Payer: No Typology Code available for payment source

## 2023-06-29 ENCOUNTER — Other Ambulatory Visit: Payer: Self-pay

## 2023-06-29 VITALS — BP 127/67 | HR 54 | Temp 98.0°F | Resp 16 | Ht 77.0 in | Wt 238.0 lb

## 2023-06-29 DIAGNOSIS — C9000 Multiple myeloma not having achieved remission: Secondary | ICD-10-CM

## 2023-06-29 DIAGNOSIS — Z5112 Encounter for antineoplastic immunotherapy: Secondary | ICD-10-CM | POA: Diagnosis not present

## 2023-06-29 LAB — CMP (CANCER CENTER ONLY)
ALT: 25 U/L (ref 0–44)
AST: 26 U/L (ref 15–41)
Albumin: 4.2 g/dL (ref 3.5–5.0)
Alkaline Phosphatase: 53 U/L (ref 38–126)
Anion gap: 6 (ref 5–15)
BUN: 26 mg/dL — ABNORMAL HIGH (ref 8–23)
CO2: 26 mmol/L (ref 22–32)
Calcium: 9.4 mg/dL (ref 8.9–10.3)
Chloride: 108 mmol/L (ref 98–111)
Creatinine: 2.69 mg/dL — ABNORMAL HIGH (ref 0.61–1.24)
GFR, Estimated: 24 mL/min — ABNORMAL LOW (ref >60)
Glucose, Bld: 101 mg/dL — ABNORMAL HIGH (ref 70–99)
Potassium: 4 mmol/L (ref 3.5–5.1)
Sodium: 140 mmol/L (ref 135–145)
Total Bilirubin: 0.5 mg/dL (ref 0.3–1.2)
Total Protein: 6.3 g/dL — ABNORMAL LOW (ref 6.5–8.1)

## 2023-06-29 LAB — CBC WITH DIFFERENTIAL (CANCER CENTER ONLY)
Abs Immature Granulocytes: 0.02 10*3/uL (ref 0.00–0.07)
Basophils Absolute: 0 10*3/uL (ref 0.0–0.1)
Basophils Relative: 1 %
Eosinophils Absolute: 0.2 10*3/uL (ref 0.0–0.5)
Eosinophils Relative: 4 %
HCT: 32.5 % — ABNORMAL LOW (ref 39.0–52.0)
Hemoglobin: 10.5 g/dL — ABNORMAL LOW (ref 13.0–17.0)
Immature Granulocytes: 0 %
Lymphocytes Relative: 24 %
Lymphs Abs: 1.2 10*3/uL (ref 0.7–4.0)
MCH: 33.2 pg (ref 26.0–34.0)
MCHC: 32.3 g/dL (ref 30.0–36.0)
MCV: 102.8 fL — ABNORMAL HIGH (ref 80.0–100.0)
Monocytes Absolute: 0.8 10*3/uL (ref 0.1–1.0)
Monocytes Relative: 16 %
Neutro Abs: 2.7 10*3/uL (ref 1.7–7.7)
Neutrophils Relative %: 55 %
Platelet Count: 209 10*3/uL (ref 150–400)
RBC: 3.16 MIL/uL — ABNORMAL LOW (ref 4.22–5.81)
RDW: 14.8 % (ref 11.5–15.5)
WBC Count: 4.8 10*3/uL (ref 4.0–10.5)
nRBC: 0 % (ref 0.0–0.2)

## 2023-06-29 MED ORDER — BORTEZOMIB CHEMO SQ INJECTION 3.5 MG (2.5MG/ML)
1.3000 mg/m2 | Freq: Once | INTRAMUSCULAR | Status: AC
  Administered 2023-06-29: 3 mg via SUBCUTANEOUS
  Filled 2023-06-29: qty 1.2

## 2023-06-29 MED ORDER — PROCHLORPERAZINE MALEATE 10 MG PO TABS
10.0000 mg | ORAL_TABLET | Freq: Once | ORAL | Status: AC
  Administered 2023-06-29: 10 mg via ORAL
  Filled 2023-06-29: qty 1

## 2023-06-29 NOTE — Patient Instructions (Signed)
[facility]  Discharge Instructions: Thank you for choosing [facility] to provide your oncology and hematology care.   If you have a lab appointment with the Cancer Center, please go directly to the Cancer Center and check in at the registration area.   Wear comfortable clothing and clothing appropriate for easy access to any Portacath or PICC line.   We strive to give you quality time with your provider. You may need to reschedule your appointment if you arrive late (15 or more minutes).  Arriving late affects you and other patients whose appointments are after yours.  Also, if you miss three or more appointments without notifying the office, you may be dismissed from the clinic at the provider's discretion.      For prescription refill requests, have your pharmacy contact our office and allow 72 hours for refills to be completed.    Today you received the following chemotherapy and/or immunotherapy agents: Velcade        To help prevent nausea and vomiting after your treatment, we encourage you to take your nausea medication as directed.  BELOW ARE SYMPTOMS THAT SHOULD BE REPORTED IMMEDIATELY: *FEVER GREATER THAN 100.4 F (38 C) OR HIGHER *CHILLS OR SWEATING *NAUSEA AND VOMITING THAT IS NOT CONTROLLED WITH YOUR NAUSEA MEDICATION *UNUSUAL SHORTNESS OF BREATH *UNUSUAL BRUISING OR BLEEDING *URINARY PROBLEMS (pain or burning when urinating, or frequent urination) *BOWEL PROBLEMS (unusual diarrhea, constipation, pain near the anus) TENDERNESS IN MOUTH AND THROAT WITH OR WITHOUT PRESENCE OF ULCERS (sore throat, sores in mouth, or a toothache) UNUSUAL RASH, SWELLING OR PAIN  UNUSUAL VAGINAL DISCHARGE OR ITCHING   Items with * indicate a potential emergency and should be followed up as soon as possible or go to the Emergency Department if any problems should occur.  Please show the CHEMOTHERAPY ALERT CARD or IMMUNOTHERAPY ALERT CARD at  check-in to the Emergency Department and triage nurse.  Should you have questions after your visit or need to cancel or reschedule your appointment, please contact [facility]  Dept: [phone]  and follow the prompts.  Office hours are 8:00 a.m. to 4:30 p.m. Monday - Friday. Please note that voicemails left after 4:00 p.m. may not be returned until the following business day.  We are closed weekends and major holidays. You have access to a nurse at all times for urgent questions. Please call the main number to the clinic Dept: [phone] and follow the prompts.   For any non-urgent questions, you may also contact your provider using MyChart. We now offer e-Visits for anyone 18 and older to request care online for non-urgent symptoms. For details visit [email or web address].   Also download the MyChart app! Go to the app store, search "MyChart", open the app, select [facility], and log in with your MyChart username and password.  

## 2023-06-29 NOTE — Progress Notes (Signed)
Per Leonides Schanz, MD, okay to treat with elevated creatinine

## 2023-06-30 LAB — KAPPA/LAMBDA LIGHT CHAINS
Kappa free light chain: 16.7 mg/L (ref 3.3–19.4)
Kappa, lambda light chain ratio: 1.22 (ref 0.26–1.65)
Lambda free light chains: 13.7 mg/L (ref 5.7–26.3)

## 2023-07-02 LAB — MULTIPLE MYELOMA PANEL, SERUM
Albumin SerPl Elph-Mcnc: 3.9 g/dL (ref 2.9–4.4)
Albumin/Glob SerPl: 1.8 — ABNORMAL HIGH (ref 0.7–1.7)
Alpha 1: 0.2 g/dL (ref 0.0–0.4)
Alpha2 Glob SerPl Elph-Mcnc: 0.7 g/dL (ref 0.4–1.0)
B-Globulin SerPl Elph-Mcnc: 0.9 g/dL (ref 0.7–1.3)
Gamma Glob SerPl Elph-Mcnc: 0.4 g/dL (ref 0.4–1.8)
Globulin, Total: 2.2 g/dL (ref 2.2–3.9)
IgA: 52 mg/dL — ABNORMAL LOW (ref 61–437)
IgG (Immunoglobin G), Serum: 480 mg/dL — ABNORMAL LOW (ref 603–1613)
IgM (Immunoglobulin M), Srm: 44 mg/dL (ref 15–143)
Total Protein ELP: 6.1 g/dL (ref 6.0–8.5)

## 2023-07-12 ENCOUNTER — Other Ambulatory Visit: Payer: Self-pay

## 2023-07-13 ENCOUNTER — Inpatient Hospital Stay
Payer: No Typology Code available for payment source | Attending: Hematology and Oncology | Admitting: Hematology and Oncology

## 2023-07-13 ENCOUNTER — Inpatient Hospital Stay: Payer: No Typology Code available for payment source

## 2023-07-13 ENCOUNTER — Inpatient Hospital Stay: Payer: No Typology Code available for payment source | Attending: Hematology and Oncology

## 2023-07-13 VITALS — BP 122/72 | HR 52 | Temp 98.0°F | Resp 16 | Wt 236.6 lb

## 2023-07-13 DIAGNOSIS — M79606 Pain in leg, unspecified: Secondary | ICD-10-CM | POA: Diagnosis not present

## 2023-07-13 DIAGNOSIS — Z881 Allergy status to other antibiotic agents status: Secondary | ICD-10-CM | POA: Diagnosis not present

## 2023-07-13 DIAGNOSIS — C9000 Multiple myeloma not having achieved remission: Secondary | ICD-10-CM | POA: Diagnosis present

## 2023-07-13 DIAGNOSIS — Z79624 Long term (current) use of inhibitors of nucleotide synthesis: Secondary | ICD-10-CM | POA: Insufficient documentation

## 2023-07-13 DIAGNOSIS — M7989 Other specified soft tissue disorders: Secondary | ICD-10-CM | POA: Insufficient documentation

## 2023-07-13 DIAGNOSIS — G620 Drug-induced polyneuropathy: Secondary | ICD-10-CM | POA: Insufficient documentation

## 2023-07-13 DIAGNOSIS — Z79899 Other long term (current) drug therapy: Secondary | ICD-10-CM | POA: Insufficient documentation

## 2023-07-13 DIAGNOSIS — Z7901 Long term (current) use of anticoagulants: Secondary | ICD-10-CM | POA: Insufficient documentation

## 2023-07-13 DIAGNOSIS — D649 Anemia, unspecified: Secondary | ICD-10-CM | POA: Diagnosis not present

## 2023-07-13 DIAGNOSIS — Z7969 Long term (current) use of other immunomodulators and immunosuppressants: Secondary | ICD-10-CM | POA: Diagnosis not present

## 2023-07-13 DIAGNOSIS — I480 Paroxysmal atrial fibrillation: Secondary | ICD-10-CM | POA: Insufficient documentation

## 2023-07-13 DIAGNOSIS — Z87891 Personal history of nicotine dependence: Secondary | ICD-10-CM | POA: Diagnosis not present

## 2023-07-13 DIAGNOSIS — R197 Diarrhea, unspecified: Secondary | ICD-10-CM | POA: Diagnosis not present

## 2023-07-13 DIAGNOSIS — T451X5A Adverse effect of antineoplastic and immunosuppressive drugs, initial encounter: Secondary | ICD-10-CM | POA: Insufficient documentation

## 2023-07-13 DIAGNOSIS — Z841 Family history of disorders of kidney and ureter: Secondary | ICD-10-CM | POA: Insufficient documentation

## 2023-07-13 DIAGNOSIS — I341 Nonrheumatic mitral (valve) prolapse: Secondary | ICD-10-CM | POA: Insufficient documentation

## 2023-07-13 DIAGNOSIS — Z8042 Family history of malignant neoplasm of prostate: Secondary | ICD-10-CM | POA: Insufficient documentation

## 2023-07-13 DIAGNOSIS — Z5112 Encounter for antineoplastic immunotherapy: Secondary | ICD-10-CM | POA: Insufficient documentation

## 2023-07-13 DIAGNOSIS — Z860102 Personal history of hyperplastic colon polyps: Secondary | ICD-10-CM | POA: Insufficient documentation

## 2023-07-13 LAB — CBC WITH DIFFERENTIAL (CANCER CENTER ONLY)
Abs Immature Granulocytes: 0.01 10*3/uL (ref 0.00–0.07)
Basophils Absolute: 0 10*3/uL (ref 0.0–0.1)
Basophils Relative: 1 %
Eosinophils Absolute: 0.2 10*3/uL (ref 0.0–0.5)
Eosinophils Relative: 4 %
HCT: 32.7 % — ABNORMAL LOW (ref 39.0–52.0)
Hemoglobin: 10.5 g/dL — ABNORMAL LOW (ref 13.0–17.0)
Immature Granulocytes: 0 %
Lymphocytes Relative: 19 %
Lymphs Abs: 0.8 10*3/uL (ref 0.7–4.0)
MCH: 33.2 pg (ref 26.0–34.0)
MCHC: 32.1 g/dL (ref 30.0–36.0)
MCV: 103.5 fL — ABNORMAL HIGH (ref 80.0–100.0)
Monocytes Absolute: 0.6 10*3/uL (ref 0.1–1.0)
Monocytes Relative: 13 %
Neutro Abs: 2.6 10*3/uL (ref 1.7–7.7)
Neutrophils Relative %: 63 %
Platelet Count: 207 10*3/uL (ref 150–400)
RBC: 3.16 MIL/uL — ABNORMAL LOW (ref 4.22–5.81)
RDW: 14.6 % (ref 11.5–15.5)
WBC Count: 4.2 10*3/uL (ref 4.0–10.5)
nRBC: 0 % (ref 0.0–0.2)

## 2023-07-13 LAB — CMP (CANCER CENTER ONLY)
ALT: 25 U/L (ref 0–44)
AST: 24 U/L (ref 15–41)
Albumin: 4.3 g/dL (ref 3.5–5.0)
Alkaline Phosphatase: 58 U/L (ref 38–126)
Anion gap: 6 (ref 5–15)
BUN: 22 mg/dL (ref 8–23)
CO2: 27 mmol/L (ref 22–32)
Calcium: 9.4 mg/dL (ref 8.9–10.3)
Chloride: 107 mmol/L (ref 98–111)
Creatinine: 2.45 mg/dL — ABNORMAL HIGH (ref 0.61–1.24)
GFR, Estimated: 26 mL/min — ABNORMAL LOW (ref >60)
Glucose, Bld: 109 mg/dL — ABNORMAL HIGH (ref 70–99)
Potassium: 4.3 mmol/L (ref 3.5–5.1)
Sodium: 140 mmol/L (ref 135–145)
Total Bilirubin: 0.5 mg/dL (ref <1.2)
Total Protein: 6.7 g/dL (ref 6.5–8.1)

## 2023-07-13 MED ORDER — BORTEZOMIB CHEMO SQ INJECTION 3.5 MG (2.5MG/ML)
1.3000 mg/m2 | Freq: Once | INTRAMUSCULAR | Status: AC
  Administered 2023-07-13: 3 mg via SUBCUTANEOUS
  Filled 2023-07-13: qty 1.2

## 2023-07-13 MED ORDER — PROCHLORPERAZINE MALEATE 10 MG PO TABS
10.0000 mg | ORAL_TABLET | Freq: Once | ORAL | Status: AC
Start: 2023-07-13 — End: 2023-07-13
  Administered 2023-07-13: 10 mg via ORAL
  Filled 2023-07-13: qty 1

## 2023-07-13 NOTE — Patient Instructions (Signed)
[facility]  Discharge Instructions: Thank you for choosing [facility] to provide your oncology and hematology care.   If you have a lab appointment with the Cancer Center, please go directly to the Cancer Center and check in at the registration area.   Wear comfortable clothing and clothing appropriate for easy access to any Portacath or PICC line.   We strive to give you quality time with your provider. You may need to reschedule your appointment if you arrive late (15 or more minutes).  Arriving late affects you and other patients whose appointments are after yours.  Also, if you miss three or more appointments without notifying the office, you may be dismissed from the clinic at the provider's discretion.      For prescription refill requests, have your pharmacy contact our office and allow 72 hours for refills to be completed.    Today you received the following chemotherapy and/or immunotherapy agents velcade      To help prevent nausea and vomiting after your treatment, we encourage you to take your nausea medication as directed.  BELOW ARE SYMPTOMS THAT SHOULD BE REPORTED IMMEDIATELY: *FEVER GREATER THAN 100.4 F (38 C) OR HIGHER *CHILLS OR SWEATING *NAUSEA AND VOMITING THAT IS NOT CONTROLLED WITH YOUR NAUSEA MEDICATION *UNUSUAL SHORTNESS OF BREATH *UNUSUAL BRUISING OR BLEEDING *URINARY PROBLEMS (pain or burning when urinating, or frequent urination) *BOWEL PROBLEMS (unusual diarrhea, constipation, pain near the anus) TENDERNESS IN MOUTH AND THROAT WITH OR WITHOUT PRESENCE OF ULCERS (sore throat, sores in mouth, or a toothache) UNUSUAL RASH, SWELLING OR PAIN  UNUSUAL VAGINAL DISCHARGE OR ITCHING   Items with * indicate a potential emergency and should be followed up as soon as possible or go to the Emergency Department if any problems should occur.  Please show the CHEMOTHERAPY ALERT CARD or IMMUNOTHERAPY ALERT CARD at check-in  to the Emergency Department and triage nurse.  Should you have questions after your visit or need to cancel or reschedule your appointment, please contact [facility]  Dept: [phone]  and follow the prompts.  Office hours are 8:00 a.m. to 4:30 p.m. Monday - Friday. Please note that voicemails left after 4:00 p.m. may not be returned until the following business day.  We are closed weekends and major holidays. You have access to a nurse at all times for urgent questions. Please call the main number to the clinic Dept: [phone] and follow the prompts.   For any non-urgent questions, you may also contact your provider using MyChart. We now offer e-Visits for anyone 18 and older to request care online for non-urgent symptoms. For details visit [email or web address].   Also download the MyChart app! Go to the app store, search "MyChart", open the app, select [facility], and log in with your MyChart username and password.   

## 2023-07-13 NOTE — Progress Notes (Signed)
[facility] [phone]   [phone]  PROGRESS NOTE  Patient Care Team: [name], MD as PCP - General (Internal Medicine)  Hematological/Oncological History # IgA Lambda Multiple Myeloma 05/03/2015: last visit with Dr. Arbutus Ped at the [facility]. Was followed for IgA lambda MGUS. 12/11/2021: labs show M protein 3.4, Kappa 19.2, Lambda 1576.4, ratio 0.01. Cr 3.47, Hgb 8.6, WBC 5.7, MCV 99, Plt 221 12/23/2021: establish care with Dr. Leonides Schanz  01/16/2022: Bone marrow biopsy performed, showed a 60% cellular bone marrow predominantly comprised of plasma cells making up 70 to 80%, lambda restricted.  Myeloma FISH panel showed no evidence of abnormalities. 02/07/2022: Cycle 1 Day 1 of CyBorD chemotherapy.  03/09/2022-03/17/2022: hospitalized for fever/pneumonia.  03/24/2022: Cycle 2 Day 1 of CyBorD chemotherapy.  04/22/2022: Cycle 3 Day 1 of CyBorD chemotherapy 05/19/2022-05/26/2022: Delayed start of Cycle 4 Day 1 of CyBorD chemotherapy due to neutropenia.  06/03/2022: Cycle 4 Day 1 of CyBorD chemotherapy. 25% dose reduction of cyclophosphamide due to cytopenias. 06/30/2022: Cycle 4 Day 22. Drop Velcade dose to 1mg /m2 and Cyclophosphamide 200 mg/m2  07/07/2022: Cycle 5 Day 1 of CyBorD chemotherapy with dose reductions.  08/04/2022: Cycle 6 Day 1 of CyBorD chemotherapy with dose reductions.  09/09/2022: transition to maintenance dose Velcade q 2 weeks.   Interval History:  Darren Hinton. 77 y.o. male with medical history significant for IgA lambda multiple myeloma who presents for a follow up visit. The patient's last visit was on 06/15/2023. In the interim since the last visit he has continued on maintenance Velcade.   On exam today Darren Hinton is unaccompanied today.  He reports he is been well overall in the interim since her last visit.  He reports he centimeter changes in his health.  He is tolerating his shots well with no difficulty.  He does have some  occasional loose stools but overall his bowels are regular.  He reports they are "not runny".  His appetite is good and his weight has dropped about 6 pounds which she attributes to him eating his wife's weight watchers diet.  He reports he has been working with physical therapy in order to improve the strength in his legs.  He notes he does still have some difficulty with the nerve pain but is doing his best to try to exercise.  He was also recently given a 0 turn the chair in order to help navigate his home more easily.  He notes he is willing and able to proceed with Velcade at this time.  He denies fevers, chills, sweats, shortness of breath, chest pain or cough. He has no other complaints. Rest of the 10 point ROS is below.   MEDICAL HISTORY:  Past Medical History:  Diagnosis Date   Allergy    Anxiety    Arthritis    Depression    Heart murmur    Hyperlipidemia    Hyperplastic colon polyp    Hyperproteinemia    Hypertension    Hypothyroidism    Kidney disease    Mitral valve prolapse    Multiple myeloma (HCC)    Paroxysmal atrial fibrillation (HCC)    Thyroid disease     SURGICAL HISTORY: Past Surgical History:  Procedure Laterality Date   CARDIOVERSION N/A 03/14/2022   Procedure: CARDIOVERSION;  Surgeon: Chrystie Nose, MD;  Location: [facility] ENDOSCOPY;  Service: Cardiovascular;  Laterality: N/A;   TEE WITHOUT CARDIOVERSION N/A 03/14/2022   Procedure: TRANSESOPHAGEAL ECHOCARDIOGRAM (TEE);  Surgeon: Chrystie Nose,  MD;  Location: [facility] ENDOSCOPY;  Service: Cardiovascular;  Laterality: N/A;    SOCIAL HISTORY: Social History   Socioeconomic History   Marital status: Married    Spouse name: Bonita Quin   Number of children: 2   Years of education: Not on file   Highest education level: Not on file  Occupational History   Occupation: retired  Tobacco Use   Smoking status: Former    Current packs/day: 0.00    Types: Cigarettes    Start date: 10/01/1958    Quit date: 10/01/1978     Years since quitting: 44.8   Smokeless tobacco: Never   Tobacco comments:    Former smoker 04/03/22  Vaping Use   Vaping status: Never Used  Substance and Sexual Activity   Alcohol use: Not Currently    Alcohol/week: 2.0 standard drinks of alcohol    Types: 2 Glasses of wine per week    Comment: None in many years   Drug use: No   Sexual activity: Not on file  Other Topics Concern   Not on file  Social History Narrative   Not on file   Social Determinants of Health   Financial Resource Strain: Not on file  Food Insecurity: Not on file  Transportation Needs: Not on file  Physical Activity: Not on file  Stress: Not on file  Social Connections: Not on file  Intimate Partner Violence: Not on file    FAMILY HISTORY: Family History  Problem Relation Age of Onset   Kidney disease Mother    Prostate cancer Father    Kidney disease Brother    Colon cancer Neg Hx    Esophageal cancer Neg Hx    Liver cancer Neg Hx    Pancreatic cancer Neg Hx    Rectal cancer Neg Hx    Stomach cancer Neg Hx     ALLERGIES:  is allergic to zithromax [azithromycin].  MEDICATIONS:  Current Outpatient Medications  Medication Sig Dispense Refill   acyclovir (ZOVIRAX) 400 MG tablet TAKE 1 TABLET BY MOUTH TWICE A DAY 180 tablet 1   allopurinol (ZYLOPRIM) 300 MG tablet TAKE 1 TABLET BY MOUTH EVERY DAY 90 tablet 1   ALPRAZolam (XANAX) 0.25 MG tablet Take 0.25 mg by mouth 2 (two) times daily as needed for anxiety.     amiodarone (PACERONE) 200 MG tablet TAKE 1 TABLET BY MOUTH EVERY DAY 90 tablet 3   amLODipine (NORVASC) 5 MG tablet TAKE 1 TABLET (5 MG TOTAL) BY MOUTH DAILY. 90 tablet 3   apixaban (ELIQUIS) 5 MG TABS tablet Take 1 tablet (5 mg total) by mouth 2 (two) times daily. 180 tablet 1   dexamethasone (DECADRON) 4 MG tablet TAKE 10 TABLETS (40 MG TOTAL) BY MOUTH ONCE A WEEK IN THE MORNING ON CHEMOTHERAPY DAYS 120 tablet 1   ferrous gluconate (FERGON) 324 MG tablet Take 1 tablet (324 mg total) by  mouth 3 (three) times daily with meals. 90 tablet 0   fluticasone (FLONASE) 50 MCG/ACT nasal spray Place 2 sprays into the nose daily as needed for allergies.     gabapentin (NEURONTIN) 300 MG capsule TAKE 1 CAPSULE BY MOUTH TWICE A DAY 60 capsule 1   levothyroxine (SYNTHROID) 100 MCG tablet Take 100 mcg by mouth See admin instructions. Take 1 tablet (100 mcg) by mouth on Mondays, Tuesdays, Wednesdays, Thursdays, Fridays & Saturdays in the morning. Skip a dose on Sundays.     loratadine (CLARITIN) 10 MG tablet Take 10 mg by mouth daily as needed  for allergies.     meclizine (ANTIVERT) 25 MG tablet Take 1 tablet (25 mg total) by mouth 3 (three) times daily as needed for dizziness. 30 tablet 0   metoprolol succinate (TOPROL-XL) 25 MG 24 hr tablet Take 1 tablet (25 mg total) by mouth daily. Take with or immediately following a meal. 30 tablet 3   Olmesartan-amLODIPine-HCTZ 20-5-12.5 MG TABS Take 1 tablet by mouth daily.     prochlorperazine (COMPAZINE) 10 MG tablet Take 1 tablet (10 mg total) by mouth every 6 (six) hours as needed for nausea or vomiting. 30 tablet 0   rosuvastatin (CRESTOR) 20 MG tablet 1 tablet Orally Once a day for 90 days     senna-docusate (SENOKOT-S) 8.6-50 MG tablet Take 1 tablet by mouth at bedtime as needed for mild constipation.     traMADol (ULTRAM) 50 MG tablet Take 1 tablet (50 mg total) by mouth every 6 (six) hours as needed. 30 tablet 0   vitamin B-12 (CYANOCOBALAMIN) 500 MCG tablet Take 500 mcg by mouth in the morning.     Current Facility-Administered Medications  Medication Dose Route Frequency Provider Last Rate Last Admin   0.9 %  sodium chloride infusion  500 mL Intravenous Once Armbruster, Willaim Rayas, MD       0.9 %  sodium chloride infusion  500 mL Intravenous Once Armbruster, Willaim Rayas, MD       Facility-Administered Medications Ordered in Other Visits  Medication Dose Route Frequency Provider Last Rate Last Admin   bortezomib SQ (VELCADE) chemo injection  (2.5mg /mL concentration) 3 mg  1.3 mg/m2 (Treatment Plan Recorded) Subcutaneous Once Jaci Standard, MD        REVIEW OF SYSTEMS:   Constitutional: ( - ) fevers, ( - )  chills , ( - ) night sweats Eyes: ( - ) blurriness of vision, ( - ) double vision, ( - ) watery eyes Ears, nose, mouth, throat, and face: ( - ) mucositis, ( - ) sore throat Respiratory: ( - ) cough, ( - ) dyspnea, ( - ) wheezes Cardiovascular: ( - ) palpitation, ( - ) chest discomfort, (+ ) lower extremity swelling Gastrointestinal:  ( - ) nausea, ( - ) heartburn, ( - ) change in bowel habits Skin: ( - ) abnormal skin rashes Lymphatics: ( - ) new lymphadenopathy, ( - ) easy bruising Neurological: ( + ) numbness, ( - ) tingling, ( - ) new weaknesses Behavioral/Psych: ( - ) mood change, ( - ) new changes  All other systems were reviewed with the patient and are negative.  PHYSICAL EXAMINATION: ECOG PERFORMANCE STATUS: 1 - Symptomatic but completely ambulatory  Vitals:   07/13/23 0840  BP: 122/72  Pulse: (!) 52  Resp: 16  Temp: 98 F (36.7 C)  SpO2: 98%     Filed Weights   07/13/23 0840  Weight: 236 lb 9.6 oz (107.3 kg)      GENERAL: Chronically ill-appearing elderly African-American male, alert, no distress and comfortable SKIN: skin color, texture, turgor are normal, no rashes or significant lesions EYES: conjunctiva are pink and non-injected, sclera clear LUNGS: clear to auscultation and percussion with normal breathing effort HEART: regular rate & rhythm and no murmurs and + 2 bilateral ankle edema Musculoskeletal: no cyanosis of digits and no clubbing  PSYCH: alert & oriented x 3, fluent speech NEURO: no focal motor/sensory deficits  LABORATORY DATA:  I have reviewed the data as listed    Latest Ref Rng & Units 07/13/2023  8:22 AM 06/29/2023    7:56 AM 06/15/2023    2:33 PM  CBC  WBC 4.0 - 10.5 K/uL 4.2  4.8  4.9   Hemoglobin 13.0 - 17.0 g/dL 60.4  54.0  98.1   Hematocrit 39.0 - 52.0 % 32.7   32.5  32.5   Platelets 150 - 400 K/uL 207  209  196        Latest Ref Rng & Units 07/13/2023    8:22 AM 06/29/2023    7:56 AM 06/15/2023    2:33 PM  CMP  Glucose 70 - 99 mg/dL 191  478  295   BUN 8 - 23 mg/dL 22  26  20    Creatinine 0.61 - 1.24 mg/dL 6.21  3.08  6.57   Sodium 135 - 145 mmol/L 140  140  140   Potassium 3.5 - 5.1 mmol/L 4.3  4.0  4.0   Chloride 98 - 111 mmol/L 107  108  108   CO2 22 - 32 mmol/L 27  26  26    Calcium 8.9 - 10.3 mg/dL 9.4  9.4  9.3   Total Protein 6.5 - 8.1 g/dL 6.7  6.3  6.7   Total Bilirubin <1.2 mg/dL 0.5  0.5  0.5   Alkaline Phos 38 - 126 U/L 58  53  57   AST 15 - 41 U/L 24  26  31    ALT 0 - 44 U/L 25  25  37     Lab Results  Component Value Date   MPROTEIN Not Observed 06/29/2023   MPROTEIN Not Observed 06/15/2023   MPROTEIN Not Observed 06/01/2023   Lab Results  Component Value Date   KPAFRELGTCHN 16.7 06/29/2023   KPAFRELGTCHN 16.3 06/15/2023   KPAFRELGTCHN 17.4 06/01/2023   LAMBDASER 13.7 06/29/2023   LAMBDASER 14.7 06/15/2023   LAMBDASER 26.3 06/01/2023   KAPLAMBRATIO 1.22 06/29/2023   KAPLAMBRATIO 1.11 06/15/2023   KAPLAMBRATIO 0.66 06/01/2023    RADIOGRAPHIC STUDIES: No results found.  ASSESSMENT & PLAN Darren Hinton. Is a 77 y.o. male with medical history significant for IgA lambda multiple myeloma who presents for a follow up visit. .    # IgA lambda Multiple Myeloma -- Bone marrow biopsy performed on 01/16/2022 showed increased plasma cells consistent with a plasma cell neoplasm.  Normal FISH panel. --Patient meets diagnostic criteria based on kidney dysfunction and anemia. --Cycle 1 Day 1 of CyBorD chemotherapy started on 02/07/2022 --Velcade maintenance therapy started on 09/09/2022.  Plan: --Most recent SPEP from 06/29/2023 showed M protein not detected,  kappa 16.7, lambda 13.7 with normal ratio. --Labs today show white blood cell 4.2, hemoglobin 10.5, MCV 103.5, platelets 207. Creatinine overall stable at 2.45.  Calcium level is normal. LFTs normal. Myeloma panel pending.  --continue on maintenance Velcade every 2 weeks with monthly clinic visits.  # Leg Pain -- Likely a component of worsening neuropathy from his chemotherapy with pre-existing sciatica pain. -- continue gabapentin to 300 mg twice daily -- continue tramadol 50 mg every 6 hours as needed -- patient connected with Dr. Barbaraann Cao  #Supportive Care -- chemotherapy education complete -- port placement not required.  -- zofran 8mg  q8H PRN and compazine 10mg  PO q6H for nausea -- acyclovir 400mg  PO BID for VCZ prophylaxis --zometa to start after dental clearance.   No orders of the defined types were placed in this encounter.  All questions were answered. The patient knows to call the clinic with any problems, questions or concerns.  I  have spent a total of 30 minutes minutes of face-to-face and non-face-to-face time, preparing to see the patient, performing a medically appropriate examination, counseling and educating the patient, documenting clinical information in the electronic health record,  and care coordination.   Ulysees Barns, MD Department of Hematology/Oncology [facility] Phone: [phone] Pager: [phone] Email: [email or web address]  07/13/2023 9:28 AM

## 2023-07-13 NOTE — Progress Notes (Signed)
Per Dr. Leonides Schanz, ok to treat with elevated Scr.

## 2023-07-14 LAB — KAPPA/LAMBDA LIGHT CHAINS
Kappa free light chain: 21.1 mg/L — ABNORMAL HIGH (ref 3.3–19.4)
Kappa, lambda light chain ratio: 1.69 — ABNORMAL HIGH (ref 0.26–1.65)
Lambda free light chains: 12.5 mg/L (ref 5.7–26.3)

## 2023-07-16 ENCOUNTER — Other Ambulatory Visit: Payer: Self-pay

## 2023-07-16 LAB — MULTIPLE MYELOMA PANEL, SERUM
Albumin SerPl Elph-Mcnc: 3.9 g/dL (ref 2.9–4.4)
Albumin/Glob SerPl: 1.9 — ABNORMAL HIGH (ref 0.7–1.7)
Alpha 1: 0.2 g/dL (ref 0.0–0.4)
Alpha2 Glob SerPl Elph-Mcnc: 0.7 g/dL (ref 0.4–1.0)
B-Globulin SerPl Elph-Mcnc: 0.8 g/dL (ref 0.7–1.3)
Gamma Glob SerPl Elph-Mcnc: 0.4 g/dL (ref 0.4–1.8)
Globulin, Total: 2.1 g/dL — ABNORMAL LOW (ref 2.2–3.9)
IgA: 41 mg/dL — ABNORMAL LOW (ref 61–437)
IgG (Immunoglobin G), Serum: 476 mg/dL — ABNORMAL LOW (ref 603–1613)
IgM (Immunoglobulin M), Srm: 47 mg/dL (ref 15–143)
Total Protein ELP: 6 g/dL (ref 6.0–8.5)

## 2023-07-27 ENCOUNTER — Inpatient Hospital Stay: Payer: No Typology Code available for payment source

## 2023-07-27 ENCOUNTER — Other Ambulatory Visit: Payer: Self-pay

## 2023-07-27 VITALS — BP 115/60 | HR 51 | Temp 98.2°F | Resp 18 | Ht 77.0 in | Wt 239.2 lb

## 2023-07-27 DIAGNOSIS — Z5112 Encounter for antineoplastic immunotherapy: Secondary | ICD-10-CM | POA: Diagnosis not present

## 2023-07-27 DIAGNOSIS — C9000 Multiple myeloma not having achieved remission: Secondary | ICD-10-CM

## 2023-07-27 LAB — CBC WITH DIFFERENTIAL (CANCER CENTER ONLY)
Abs Immature Granulocytes: 0.02 10*3/uL (ref 0.00–0.07)
Basophils Absolute: 0.1 10*3/uL (ref 0.0–0.1)
Basophils Relative: 1 %
Eosinophils Absolute: 0.2 10*3/uL (ref 0.0–0.5)
Eosinophils Relative: 4 %
HCT: 32 % — ABNORMAL LOW (ref 39.0–52.0)
Hemoglobin: 10.2 g/dL — ABNORMAL LOW (ref 13.0–17.0)
Immature Granulocytes: 0 %
Lymphocytes Relative: 22 %
Lymphs Abs: 1.1 10*3/uL (ref 0.7–4.0)
MCH: 33.7 pg (ref 26.0–34.0)
MCHC: 31.9 g/dL (ref 30.0–36.0)
MCV: 105.6 fL — ABNORMAL HIGH (ref 80.0–100.0)
Monocytes Absolute: 0.8 10*3/uL (ref 0.1–1.0)
Monocytes Relative: 17 %
Neutro Abs: 2.6 10*3/uL (ref 1.7–7.7)
Neutrophils Relative %: 56 %
Platelet Count: 197 10*3/uL (ref 150–400)
RBC: 3.03 MIL/uL — ABNORMAL LOW (ref 4.22–5.81)
RDW: 14.9 % (ref 11.5–15.5)
WBC Count: 4.7 10*3/uL (ref 4.0–10.5)
nRBC: 0 % (ref 0.0–0.2)

## 2023-07-27 LAB — CMP (CANCER CENTER ONLY)
ALT: 22 U/L (ref 0–44)
AST: 24 U/L (ref 15–41)
Albumin: 4.2 g/dL (ref 3.5–5.0)
Alkaline Phosphatase: 50 U/L (ref 38–126)
Anion gap: 6 (ref 5–15)
BUN: 29 mg/dL — ABNORMAL HIGH (ref 8–23)
CO2: 27 mmol/L (ref 22–32)
Calcium: 9.2 mg/dL (ref 8.9–10.3)
Chloride: 109 mmol/L (ref 98–111)
Creatinine: 2.96 mg/dL — ABNORMAL HIGH (ref 0.61–1.24)
GFR, Estimated: 21 mL/min — ABNORMAL LOW (ref >60)
Glucose, Bld: 101 mg/dL — ABNORMAL HIGH (ref 70–99)
Potassium: 4.3 mmol/L (ref 3.5–5.1)
Sodium: 142 mmol/L (ref 135–145)
Total Bilirubin: 0.5 mg/dL (ref <1.2)
Total Protein: 6.5 g/dL (ref 6.5–8.1)

## 2023-07-27 MED ORDER — BORTEZOMIB CHEMO SQ INJECTION 3.5 MG (2.5MG/ML)
1.3000 mg/m2 | Freq: Once | INTRAMUSCULAR | Status: AC
  Administered 2023-07-27: 3 mg via SUBCUTANEOUS
  Filled 2023-07-27: qty 1.2

## 2023-07-27 MED ORDER — PROCHLORPERAZINE MALEATE 10 MG PO TABS
10.0000 mg | ORAL_TABLET | Freq: Once | ORAL | Status: AC
  Administered 2023-07-27: 10 mg via ORAL
  Filled 2023-07-27: qty 1

## 2023-07-27 NOTE — Patient Instructions (Signed)
 [facility] - A DEPT OF [facility]  Discharge Instructions: Thank you for choosing [facility] to provide your oncology and hematology care.   If you have a lab appointment with the Cancer Center, please go directly to the Cancer Center and check in at the registration area.   Wear comfortable clothing and clothing appropriate for easy access to any Portacath or PICC line.   We strive to give you quality time with your provider. You may need to reschedule your appointment if you arrive late (15 or more minutes).  Arriving late affects you and other patients whose appointments are after yours.  Also, if you miss three or more appointments without notifying the office, you may be dismissed from the clinic at the provider's discretion.      For prescription refill requests, have your pharmacy contact our office and allow 72 hours for refills to be completed.    Today you received the following chemotherapy and/or immunotherapy agents velcade      To help prevent nausea and vomiting after your treatment, we encourage you to take your nausea medication as directed.  BELOW ARE SYMPTOMS THAT SHOULD BE REPORTED IMMEDIATELY: *FEVER GREATER THAN 100.4 F (38 C) OR HIGHER *CHILLS OR SWEATING *NAUSEA AND VOMITING THAT IS NOT CONTROLLED WITH YOUR NAUSEA MEDICATION *UNUSUAL SHORTNESS OF BREATH *UNUSUAL BRUISING OR BLEEDING *URINARY PROBLEMS (pain or burning when urinating, or frequent urination) *BOWEL PROBLEMS (unusual diarrhea, constipation, pain near the anus) TENDERNESS IN MOUTH AND THROAT WITH OR WITHOUT PRESENCE OF ULCERS (sore throat, sores in mouth, or a toothache) UNUSUAL RASH, SWELLING OR PAIN  UNUSUAL VAGINAL DISCHARGE OR ITCHING   Items with * indicate a potential emergency and should be followed up as soon as possible or go to the Emergency Department if any problems should occur.  Please show the CHEMOTHERAPY ALERT CARD or IMMUNOTHERAPY  ALERT CARD at check-in to the Emergency Department and triage nurse.  Should you have questions after your visit or need to cancel or reschedule your appointment, please contact [facility] - A DEPT OF [facility]  Dept: [phone]  and follow the prompts.  Office hours are 8:00 a.m. to 4:30 p.m. Monday - Friday. Please note that voicemails left after 4:00 p.m. may not be returned until the following business day.  We are closed weekends and major holidays. You have access to a nurse at all times for urgent questions. Please call the main number to the clinic Dept: [phone] and follow the prompts.   For any non-urgent questions, you may also contact your provider using MyChart. We now offer e-Visits for anyone 55 and older to request care online for non-urgent symptoms. For details visit [email or web address].   Also download the MyChart app! Go to the app store, search "MyChart", open the app, select [facility], and log in with your MyChart username and password.

## 2023-07-27 NOTE — Progress Notes (Signed)
Patient's HR is 51 and creatinine 2.96. Ok to proceed with treatment per Dr. Leonides Schanz.

## 2023-07-28 LAB — KAPPA/LAMBDA LIGHT CHAINS
Kappa free light chain: 18.2 mg/L (ref 3.3–19.4)
Kappa, lambda light chain ratio: 1.33 (ref 0.26–1.65)
Lambda free light chains: 13.7 mg/L (ref 5.7–26.3)

## 2023-08-02 LAB — MULTIPLE MYELOMA PANEL, SERUM
Albumin SerPl Elph-Mcnc: 3.5 g/dL (ref 2.9–4.4)
Albumin/Glob SerPl: 1.4 (ref 0.7–1.7)
Alpha 1: 0.3 g/dL (ref 0.0–0.4)
Alpha2 Glob SerPl Elph-Mcnc: 0.8 g/dL (ref 0.4–1.0)
B-Globulin SerPl Elph-Mcnc: 0.9 g/dL (ref 0.7–1.3)
Gamma Glob SerPl Elph-Mcnc: 0.6 g/dL (ref 0.4–1.8)
Globulin, Total: 2.6 g/dL (ref 2.2–3.9)
IgA: 36 mg/dL — ABNORMAL LOW (ref 61–437)
IgG (Immunoglobin G), Serum: 460 mg/dL — ABNORMAL LOW (ref 603–1613)
IgM (Immunoglobulin M), Srm: 45 mg/dL (ref 15–143)
Total Protein ELP: 6.1 g/dL (ref 6.0–8.5)

## 2023-08-06 ENCOUNTER — Other Ambulatory Visit: Payer: Self-pay

## 2023-08-07 ENCOUNTER — Other Ambulatory Visit: Payer: Self-pay | Admitting: *Deleted

## 2023-08-07 DIAGNOSIS — C9000 Multiple myeloma not having achieved remission: Secondary | ICD-10-CM

## 2023-08-09 NOTE — Progress Notes (Unsigned)
[facility] [phone]   [phone]  PROGRESS NOTE  Patient Care Team: [name], MD as PCP - General (Internal Medicine)  Hematological/Oncological History # IgA Lambda Multiple Myeloma 05/03/2015: last visit with Dr. Arbutus Ped at the [facility]. Was followed for IgA lambda MGUS. 12/11/2021: labs show M protein 3.4, Kappa 19.2, Lambda 1576.4, ratio 0.01. Cr 3.47, Hgb 8.6, WBC 5.7, MCV 99, Plt 221 12/23/2021: establish care with Dr. Leonides Schanz  01/16/2022: Bone marrow biopsy performed, showed a 60% cellular bone marrow predominantly comprised of plasma cells making up 70 to 80%, lambda restricted.  Myeloma FISH panel showed no evidence of abnormalities. 02/07/2022: Cycle 1 Day 1 of CyBorD chemotherapy.  03/09/2022-03/17/2022: hospitalized for fever/pneumonia.  03/24/2022: Cycle 2 Day 1 of CyBorD chemotherapy.  04/22/2022: Cycle 3 Day 1 of CyBorD chemotherapy 05/19/2022-05/26/2022: Delayed start of Cycle 4 Day 1 of CyBorD chemotherapy due to neutropenia.  06/03/2022: Cycle 4 Day 1 of CyBorD chemotherapy. 25% dose reduction of cyclophosphamide due to cytopenias. 06/30/2022: Cycle 4 Day 22. Drop Velcade dose to 1mg /m2 and Cyclophosphamide 200 mg/m2  07/07/2022: Cycle 5 Day 1 of CyBorD chemotherapy with dose reductions.  08/04/2022: Cycle 6 Day 1 of CyBorD chemotherapy with dose reductions.  09/09/2022: transition to maintenance dose Velcade q 2 weeks.   Interval History:  Darren Hinton. 77 y.o. male with medical history significant for IgA lambda multiple myeloma who presents for a follow up visit. The patient's last visit was on 07/13/2023. In the interim since the last visit he has continued on maintenance Velcade.   On exam today Darren Hinton is unaccompanied today.  He reports he has been well overall in the interim since her last visit.  He reports he had Thanksgiving with his family and then spent time at the beach.  He reports his energy levels have been  good though he does feel some occasional weakness in his legs.  He is using a rollator walker.  [facility] but he is trying not to use it as he would like to continue to build up strength.  He reports that he does do his best to try to exercise and work his legs.  He notes he is not having any side effects as a result of the Velcade treatment.  Does not have any redness or itching at the injection site.  He is not having any runny nose, sore throat, or cough.  He otherwise denies any side effects.  He notes he is willing and able to proceed with Velcade at this time.  He denies fevers, chills, sweats, shortness of breath, chest pain or cough. He has no other complaints. Rest of the 10 point ROS is below.   MEDICAL HISTORY:  Past Medical History:  Diagnosis Date   Allergy    Anxiety    Arthritis    Depression    Heart murmur    Hyperlipidemia    Hyperplastic colon polyp    Hyperproteinemia    Hypertension    Hypothyroidism    Kidney disease    Mitral valve prolapse    Multiple myeloma (HCC)    Paroxysmal atrial fibrillation (HCC)    Thyroid disease     SURGICAL HISTORY: Past Surgical History:  Procedure Laterality Date   CARDIOVERSION N/A 03/14/2022   Procedure: CARDIOVERSION;  Surgeon: Chrystie Nose, MD;  Location: [facility] ENDOSCOPY;  Service: Cardiovascular;  Laterality: N/A;   TEE WITHOUT CARDIOVERSION N/A 03/14/2022   Procedure:  TRANSESOPHAGEAL ECHOCARDIOGRAM (TEE);  Surgeon: Chrystie Nose, MD;  Location: [facility] ENDOSCOPY;  Service: Cardiovascular;  Laterality: N/A;    SOCIAL HISTORY: Social History   Socioeconomic History   Marital status: Married    Spouse name: Bonita Quin   Number of children: 2   Years of education: Not on file   Highest education level: Not on file  Occupational History   Occupation: retired  Tobacco Use   Smoking status: Former    Current packs/day: 0.00    Types: Cigarettes    Start date: 10/01/1958    Quit date: 10/01/1978    Years  since quitting: 44.8   Smokeless tobacco: Never   Tobacco comments:    Former smoker 04/03/22  Vaping Use   Vaping status: Never Used  Substance and Sexual Activity   Alcohol use: Not Currently    Alcohol/week: 2.0 standard drinks of alcohol    Types: 2 Glasses of wine per week    Comment: None in many years   Drug use: No   Sexual activity: Not on file  Other Topics Concern   Not on file  Social History Narrative   Not on file   Social Determinants of Health   Financial Resource Strain: Not on file  Food Insecurity: Not on file  Transportation Needs: Not on file  Physical Activity: Not on file  Stress: Not on file  Social Connections: Not on file  Intimate Partner Violence: Not on file    FAMILY HISTORY: Family History  Problem Relation Age of Onset   Kidney disease Mother    Prostate cancer Father    Kidney disease Brother    Colon cancer Neg Hx    Esophageal cancer Neg Hx    Liver cancer Neg Hx    Pancreatic cancer Neg Hx    Rectal cancer Neg Hx    Stomach cancer Neg Hx     ALLERGIES:  is allergic to zithromax [azithromycin].  MEDICATIONS:  Current Outpatient Medications  Medication Sig Dispense Refill   acyclovir (ZOVIRAX) 400 MG tablet TAKE 1 TABLET BY MOUTH TWICE A DAY 180 tablet 1   allopurinol (ZYLOPRIM) 300 MG tablet TAKE 1 TABLET BY MOUTH EVERY DAY 90 tablet 1   ALPRAZolam (XANAX) 0.25 MG tablet Take 0.25 mg by mouth 2 (two) times daily as needed for anxiety.     amiodarone (PACERONE) 200 MG tablet TAKE 1 TABLET BY MOUTH EVERY DAY 90 tablet 3   amLODipine (NORVASC) 5 MG tablet TAKE 1 TABLET (5 MG TOTAL) BY MOUTH DAILY. 90 tablet 3   apixaban (ELIQUIS) 5 MG TABS tablet Take 1 tablet (5 mg total) by mouth 2 (two) times daily. 180 tablet 1   dexamethasone (DECADRON) 4 MG tablet TAKE 10 TABLETS (40 MG TOTAL) BY MOUTH ONCE A WEEK IN THE MORNING ON CHEMOTHERAPY DAYS 120 tablet 1   ferrous gluconate (FERGON) 324 MG tablet Take 1 tablet (324 mg total) by mouth  3 (three) times daily with meals. 90 tablet 0   fluticasone (FLONASE) 50 MCG/ACT nasal spray Place 2 sprays into the nose daily as needed for allergies.     gabapentin (NEURONTIN) 300 MG capsule TAKE 1 CAPSULE BY MOUTH TWICE A DAY 60 capsule 1   levothyroxine (SYNTHROID) 100 MCG tablet Take 100 mcg by mouth See admin instructions. Take 1 tablet (100 mcg) by mouth on Mondays, Tuesdays, Wednesdays, Thursdays, Fridays & Saturdays in the morning. Skip a dose on Sundays.     loratadine (CLARITIN) 10 MG tablet  Take 10 mg by mouth daily as needed for allergies.     meclizine (ANTIVERT) 25 MG tablet Take 1 tablet (25 mg total) by mouth 3 (three) times daily as needed for dizziness. 30 tablet 0   metoprolol succinate (TOPROL-XL) 25 MG 24 hr tablet Take 1 tablet (25 mg total) by mouth daily. Take with or immediately following a meal. 30 tablet 3   Olmesartan-amLODIPine-HCTZ 20-5-12.5 MG TABS Take 1 tablet by mouth daily.     prochlorperazine (COMPAZINE) 10 MG tablet Take 1 tablet (10 mg total) by mouth every 6 (six) hours as needed for nausea or vomiting. 30 tablet 0   rosuvastatin (CRESTOR) 20 MG tablet 1 tablet Orally Once a day for 90 days     senna-docusate (SENOKOT-S) 8.6-50 MG tablet Take 1 tablet by mouth at bedtime as needed for mild constipation.     traMADol (ULTRAM) 50 MG tablet Take 1 tablet (50 mg total) by mouth every 6 (six) hours as needed. 30 tablet 0   vitamin B-12 (CYANOCOBALAMIN) 500 MCG tablet Take 500 mcg by mouth in the morning.     Current Facility-Administered Medications  Medication Dose Route Frequency Provider Last Rate Last Admin   0.9 %  sodium chloride infusion  500 mL Intravenous Once Armbruster, Willaim Rayas, MD       0.9 %  sodium chloride infusion  500 mL Intravenous Once Armbruster, Willaim Rayas, MD       Facility-Administered Medications Ordered in Other Visits  Medication Dose Route Frequency Provider Last Rate Last Admin   bortezomib SQ (VELCADE) chemo injection (2.5mg /mL  concentration) 3 mg  1.3 mg/m2 (Treatment Plan Recorded) Subcutaneous Once Jaci Standard, MD        REVIEW OF SYSTEMS:   Constitutional: ( - ) fevers, ( - )  chills , ( - ) night sweats Eyes: ( - ) blurriness of vision, ( - ) double vision, ( - ) watery eyes Ears, nose, mouth, throat, and face: ( - ) mucositis, ( - ) sore throat Respiratory: ( - ) cough, ( - ) dyspnea, ( - ) wheezes Cardiovascular: ( - ) palpitation, ( - ) chest discomfort, (+ ) lower extremity swelling Gastrointestinal:  ( - ) nausea, ( - ) heartburn, ( - ) change in bowel habits Skin: ( - ) abnormal skin rashes Lymphatics: ( - ) new lymphadenopathy, ( - ) easy bruising Neurological: ( + ) numbness, ( - ) tingling, ( - ) new weaknesses Behavioral/Psych: ( - ) mood change, ( - ) new changes  All other systems were reviewed with the patient and are negative.  PHYSICAL EXAMINATION: ECOG PERFORMANCE STATUS: 1 - Symptomatic but completely ambulatory  Vitals:   08/10/23 0905  BP: (!) 126/56  Pulse: (!) 51  Resp: 16  Temp: (!) 97.3 F (36.3 C)  SpO2: 98%      Filed Weights   08/10/23 0905  Weight: 239 lb 8 oz (108.6 kg)       GENERAL: Chronically ill-appearing elderly African-American male, alert, no distress and comfortable SKIN: skin color, texture, turgor are normal, no rashes or significant lesions EYES: conjunctiva are pink and non-injected, sclera clear LUNGS: clear to auscultation and percussion with normal breathing effort HEART: regular rate & rhythm and no murmurs and + 2 bilateral ankle edema Musculoskeletal: no cyanosis of digits and no clubbing  PSYCH: alert & oriented x 3, fluent speech NEURO: no focal motor/sensory deficits  LABORATORY DATA:  I have reviewed the data  as listed    Latest Ref Rng & Units 08/10/2023    8:41 AM 07/27/2023    8:17 AM 07/13/2023    8:22 AM  CBC  WBC 4.0 - 10.5 K/uL 4.3  4.7  4.2   Hemoglobin 13.0 - 17.0 g/dL 16.1  09.6  04.5   Hematocrit 39.0 - 52.0 %  31.8  32.0  32.7   Platelets 150 - 400 K/uL 200  197  207        Latest Ref Rng & Units 08/10/2023    8:41 AM 07/27/2023    8:17 AM 07/13/2023    8:22 AM  CMP  Glucose 70 - 99 mg/dL 93  409  811   BUN 8 - 23 mg/dL 20  29  22    Creatinine 0.61 - 1.24 mg/dL 9.14  7.82  9.56   Sodium 135 - 145 mmol/L 140  142  140   Potassium 3.5 - 5.1 mmol/L 4.1  4.3  4.3   Chloride 98 - 111 mmol/L 106  109  107   CO2 22 - 32 mmol/L 27  27  27    Calcium 8.9 - 10.3 mg/dL 9.5  9.2  9.4   Total Protein 6.5 - 8.1 g/dL 6.5  6.5  6.7   Total Bilirubin <1.2 mg/dL 0.5  0.5  0.5   Alkaline Phos 38 - 126 U/L 54  50  58   AST 15 - 41 U/L 21  24  24    ALT 0 - 44 U/L 20  22  25      Lab Results  Component Value Date   MPROTEIN Not Observed 07/27/2023   MPROTEIN Not Observed 07/13/2023   MPROTEIN Not Observed 06/29/2023   Lab Results  Component Value Date   KPAFRELGTCHN 18.2 07/27/2023   KPAFRELGTCHN 21.1 (H) 07/13/2023   KPAFRELGTCHN 16.7 06/29/2023   LAMBDASER 13.7 07/27/2023   LAMBDASER 12.5 07/13/2023   LAMBDASER 13.7 06/29/2023   KAPLAMBRATIO 1.33 07/27/2023   KAPLAMBRATIO 1.69 (H) 07/13/2023   KAPLAMBRATIO 1.22 06/29/2023    RADIOGRAPHIC STUDIES: No results found.  ASSESSMENT & PLAN Darren Hinton. Is a 77 y.o. male with medical history significant for IgA lambda multiple myeloma who presents for a follow up visit. .    # IgA lambda Multiple Myeloma -- Bone marrow biopsy performed on 01/16/2022 showed increased plasma cells consistent with a plasma cell neoplasm.  Normal FISH panel. --Patient meets diagnostic criteria based on kidney dysfunction and anemia. --Cycle 1 Day 1 of CyBorD chemotherapy started on 02/07/2022 --Velcade maintenance therapy started on 09/09/2022.  Plan: --Most recent SPEP from 06/29/2023 showed M protein not detected,  kappa 16.7, lambda 13.7 with normal ratio. --Labs today show white blood cell 4.3, Hgb 10.3, MCV 103.6, Plt 200. Creatinine overall stable at 2.60.  Calcium level is normal. LFTs normal. Myeloma panel pending.  --continue on maintenance Velcade every 2 weeks with monthly clinic visits.  # Leg Pain -- Likely a component of worsening neuropathy from his chemotherapy with pre-existing sciatica pain. -- continue gabapentin to 300 mg twice daily -- continue tramadol 50 mg every 6 hours as needed -- patient connected with Dr. Barbaraann Cao  #Supportive Care -- chemotherapy education complete -- port placement not required.  -- zofran 8mg  q8H PRN and compazine 10mg  PO q6H for nausea -- acyclovir 400mg  PO BID for VCZ prophylaxis --zometa to start after dental clearance.   No orders of the defined types were placed in this encounter.  All questions were  answered. The patient knows to call the clinic with any problems, questions or concerns.  I have spent a total of 30 minutes minutes of face-to-face and non-face-to-face time, preparing to see the patient, performing a medically appropriate examination, counseling and educating the patient, documenting clinical information in the electronic health record,  and care coordination.   Ulysees Barns, MD Department of Hematology/Oncology [facility] Phone: [phone] Pager: [phone] Email: [email or web address]  08/10/2023 9:43 AM

## 2023-08-10 ENCOUNTER — Inpatient Hospital Stay: Payer: No Typology Code available for payment source

## 2023-08-10 ENCOUNTER — Inpatient Hospital Stay
Payer: No Typology Code available for payment source | Attending: Hematology and Oncology | Admitting: Hematology and Oncology

## 2023-08-10 VITALS — BP 123/63 | HR 53 | Temp 97.7°F | Resp 20

## 2023-08-10 VITALS — BP 126/56 | HR 51 | Temp 97.3°F | Resp 16 | Wt 239.5 lb

## 2023-08-10 DIAGNOSIS — Z881 Allergy status to other antibiotic agents status: Secondary | ICD-10-CM | POA: Diagnosis not present

## 2023-08-10 DIAGNOSIS — M7989 Other specified soft tissue disorders: Secondary | ICD-10-CM | POA: Diagnosis not present

## 2023-08-10 DIAGNOSIS — Z7969 Long term (current) use of other immunomodulators and immunosuppressants: Secondary | ICD-10-CM | POA: Diagnosis not present

## 2023-08-10 DIAGNOSIS — Z860102 Personal history of hyperplastic colon polyps: Secondary | ICD-10-CM | POA: Insufficient documentation

## 2023-08-10 DIAGNOSIS — Z5112 Encounter for antineoplastic immunotherapy: Secondary | ICD-10-CM | POA: Diagnosis present

## 2023-08-10 DIAGNOSIS — Z841 Family history of disorders of kidney and ureter: Secondary | ICD-10-CM | POA: Insufficient documentation

## 2023-08-10 DIAGNOSIS — D649 Anemia, unspecified: Secondary | ICD-10-CM | POA: Insufficient documentation

## 2023-08-10 DIAGNOSIS — I480 Paroxysmal atrial fibrillation: Secondary | ICD-10-CM | POA: Insufficient documentation

## 2023-08-10 DIAGNOSIS — Z8042 Family history of malignant neoplasm of prostate: Secondary | ICD-10-CM | POA: Diagnosis not present

## 2023-08-10 DIAGNOSIS — C9000 Multiple myeloma not having achieved remission: Secondary | ICD-10-CM | POA: Diagnosis present

## 2023-08-10 DIAGNOSIS — Z79624 Long term (current) use of inhibitors of nucleotide synthesis: Secondary | ICD-10-CM | POA: Diagnosis not present

## 2023-08-10 DIAGNOSIS — R200 Anesthesia of skin: Secondary | ICD-10-CM | POA: Diagnosis not present

## 2023-08-10 DIAGNOSIS — Z79899 Other long term (current) drug therapy: Secondary | ICD-10-CM | POA: Diagnosis not present

## 2023-08-10 DIAGNOSIS — M79606 Pain in leg, unspecified: Secondary | ICD-10-CM | POA: Insufficient documentation

## 2023-08-10 DIAGNOSIS — T451X5A Adverse effect of antineoplastic and immunosuppressive drugs, initial encounter: Secondary | ICD-10-CM | POA: Diagnosis not present

## 2023-08-10 DIAGNOSIS — Z87891 Personal history of nicotine dependence: Secondary | ICD-10-CM | POA: Insufficient documentation

## 2023-08-10 DIAGNOSIS — G620 Drug-induced polyneuropathy: Secondary | ICD-10-CM | POA: Diagnosis not present

## 2023-08-10 DIAGNOSIS — Z7901 Long term (current) use of anticoagulants: Secondary | ICD-10-CM | POA: Diagnosis not present

## 2023-08-10 LAB — CBC WITH DIFFERENTIAL (CANCER CENTER ONLY)
Abs Immature Granulocytes: 0.01 10*3/uL (ref 0.00–0.07)
Basophils Absolute: 0 10*3/uL (ref 0.0–0.1)
Basophils Relative: 1 %
Eosinophils Absolute: 0.3 10*3/uL (ref 0.0–0.5)
Eosinophils Relative: 6 %
HCT: 31.8 % — ABNORMAL LOW (ref 39.0–52.0)
Hemoglobin: 10.3 g/dL — ABNORMAL LOW (ref 13.0–17.0)
Immature Granulocytes: 0 %
Lymphocytes Relative: 24 %
Lymphs Abs: 1 10*3/uL (ref 0.7–4.0)
MCH: 33.6 pg (ref 26.0–34.0)
MCHC: 32.4 g/dL (ref 30.0–36.0)
MCV: 103.6 fL — ABNORMAL HIGH (ref 80.0–100.0)
Monocytes Absolute: 0.7 10*3/uL (ref 0.1–1.0)
Monocytes Relative: 16 %
Neutro Abs: 2.3 10*3/uL (ref 1.7–7.7)
Neutrophils Relative %: 53 %
Platelet Count: 200 10*3/uL (ref 150–400)
RBC: 3.07 MIL/uL — ABNORMAL LOW (ref 4.22–5.81)
RDW: 14.8 % (ref 11.5–15.5)
WBC Count: 4.3 10*3/uL (ref 4.0–10.5)
nRBC: 0 % (ref 0.0–0.2)

## 2023-08-10 LAB — CMP (CANCER CENTER ONLY)
ALT: 20 U/L (ref 0–44)
AST: 21 U/L (ref 15–41)
Albumin: 4.2 g/dL (ref 3.5–5.0)
Alkaline Phosphatase: 54 U/L (ref 38–126)
Anion gap: 7 (ref 5–15)
BUN: 20 mg/dL (ref 8–23)
CO2: 27 mmol/L (ref 22–32)
Calcium: 9.5 mg/dL (ref 8.9–10.3)
Chloride: 106 mmol/L (ref 98–111)
Creatinine: 2.6 mg/dL — ABNORMAL HIGH (ref 0.61–1.24)
GFR, Estimated: 25 mL/min — ABNORMAL LOW (ref >60)
Glucose, Bld: 93 mg/dL (ref 70–99)
Potassium: 4.1 mmol/L (ref 3.5–5.1)
Sodium: 140 mmol/L (ref 135–145)
Total Bilirubin: 0.5 mg/dL (ref <1.2)
Total Protein: 6.5 g/dL (ref 6.5–8.1)

## 2023-08-10 MED ORDER — BORTEZOMIB CHEMO SQ INJECTION 3.5 MG (2.5MG/ML)
1.3000 mg/m2 | Freq: Once | INTRAMUSCULAR | Status: AC
  Administered 2023-08-10: 3 mg via SUBCUTANEOUS
  Filled 2023-08-10: qty 1.2

## 2023-08-10 MED ORDER — PROCHLORPERAZINE MALEATE 10 MG PO TABS
10.0000 mg | ORAL_TABLET | Freq: Once | ORAL | Status: AC
  Administered 2023-08-10: 10 mg via ORAL
  Filled 2023-08-10: qty 1

## 2023-08-10 NOTE — Patient Instructions (Signed)
 [facility] - A DEPT OF [facility]  Discharge Instructions: Thank you for choosing [facility] to provide your oncology and hematology care.   If you have a lab appointment with the Cancer Center, please go directly to the Cancer Center and check in at the registration area.   Wear comfortable clothing and clothing appropriate for easy access to any Portacath or PICC line.   We strive to give you quality time with your provider. You may need to reschedule your appointment if you arrive late (15 or more minutes).  Arriving late affects you and other patients whose appointments are after yours.  Also, if you miss three or more appointments without notifying the office, you may be dismissed from the clinic at the provider's discretion.      For prescription refill requests, have your pharmacy contact our office and allow 72 hours for refills to be completed.    Today you received the following chemotherapy and/or immunotherapy agent: Bortezomib (Velcade)      To help prevent nausea and vomiting after your treatment, we encourage you to take your nausea medication as directed.  BELOW ARE SYMPTOMS THAT SHOULD BE REPORTED IMMEDIATELY: *FEVER GREATER THAN 100.4 F (38 C) OR HIGHER *CHILLS OR SWEATING *NAUSEA AND VOMITING THAT IS NOT CONTROLLED WITH YOUR NAUSEA MEDICATION *UNUSUAL SHORTNESS OF BREATH *UNUSUAL BRUISING OR BLEEDING *URINARY PROBLEMS (pain or burning when urinating, or frequent urination) *BOWEL PROBLEMS (unusual diarrhea, constipation, pain near the anus) TENDERNESS IN MOUTH AND THROAT WITH OR WITHOUT PRESENCE OF ULCERS (sore throat, sores in mouth, or a toothache) UNUSUAL RASH, SWELLING OR PAIN  UNUSUAL VAGINAL DISCHARGE OR ITCHING   Items with * indicate a potential emergency and should be followed up as soon as possible or go to the Emergency Department if any problems should occur.  Please show the CHEMOTHERAPY ALERT CARD or  IMMUNOTHERAPY ALERT CARD at check-in to the Emergency Department and triage nurse.  Should you have questions after your visit or need to cancel or reschedule your appointment, please contact [facility] - A DEPT OF [facility]  Dept: [phone]  and follow the prompts.  Office hours are 8:00 a.m. to 4:30 p.m. Monday - Friday. Please note that voicemails left after 4:00 p.m. may not be returned until the following business day.  We are closed weekends and major holidays. You have access to a nurse at all times for urgent questions. Please call the main number to the clinic Dept: [phone] and follow the prompts.   For any non-urgent questions, you may also contact your provider using MyChart. We now offer e-Visits for anyone 74 and older to request care online for non-urgent symptoms. For details visit [email or web address].   Also download the MyChart app! Go to the app store, search "MyChart", open the app, select [place], and log in with your MyChart username and password. Bortezomib Injection What is this medication? BORTEZOMIB (bor TEZ oh mib) treats lymphoma. It may also be used to treat multiple myeloma, a type of bone marrow cancer. It works by blocking a protein that causes cancer cells to grow and multiply. This helps to slow or stop the spread of cancer cells. This medicine may be used for other purposes; ask your health care provider or pharmacist if you have questions. COMMON BRAND NAME(S): Velcade What should I tell my care team before I take this medication? They need to know if you have any  of these conditions: Dehydration Diabetes Heart disease Liver disease Tingling of the fingers or toes or other nerve disorder An unusual or allergic reaction to bortezomib, other medications, foods, dyes, or preservatives If you or your partner are pregnant or trying to get pregnant Breastfeeding How should I use this medication? This medication  is injected into a vein or under the skin. It is given by your care team in a hospital or clinic setting. Talk to your care team about the use of this medication in children. Special care may be needed. Overdosage: If you think you have taken too much of this medicine contact a poison control center or emergency room at once. NOTE: This medicine is only for you. Do not share this medicine with others. What if I miss a dose? Keep appointments for follow-up doses. It is important not to miss your dose. Call your care team if you are unable to keep an appointment. What may interact with this medication? Ketoconazole Rifampin This list may not describe all possible interactions. Give your health care provider a list of all the medicines, herbs, non-prescription drugs, or dietary supplements you use. Also tell them if you smoke, drink alcohol, or use illegal drugs. Some items may interact with your medicine. What should I watch for while using this medication? Your condition will be monitored carefully while you are receiving this medication. You may need blood work while taking this medication. This medication may affect your coordination, reaction time, or judgment. Do not drive or operate machinery until you know how this medication affects you. Sit up or stand slowly to reduce the risk of dizzy or fainting spells. Drinking alcohol with this medication can increase the risk of these side effects. This medication may increase your risk of getting an infection. Call your care team for advice if you get a fever, chills, sore throat, or other symptoms of a cold or flu. Do not treat yourself. Try to avoid being around people who are sick. Check with your care team if you have severe diarrhea, nausea, and vomiting, or if you sweat a lot. The loss of too much body fluid may make it dangerous for you to take this medication. Talk to your care team if you may be pregnant. Serious birth defects can occur if you  take this medication during pregnancy and for 7 months after the last dose. You will need a negative pregnancy test before starting this medication. Contraception is recommended while taking this medication and for 7 months after the last dose. Your care team can help you find the option that works for you. If your partner can get pregnant, use a condom during sex while taking this medication and for 4 months after the last dose. Do not breastfeed while taking this medication and for 2 months after the last dose. This medication may cause infertility. Talk to your care team if you are concerned about your fertility. What side effects may I notice from receiving this medication? Side effects that you should report to your care team as soon as possible: Allergic reactions--skin rash, itching, hives, swelling of the face, lips, tongue, or throat Bleeding--bloody or black, tar-like stools, vomiting blood or brown material that looks like coffee grounds, red or dark brown urine, small red or purple spots on skin, unusual bruising or bleeding Bleeding in the brain--severe headache, stiff neck, confusion, dizziness, change in vision, numbness or weakness of the face, arm, or leg, trouble speaking, trouble walking, vomiting Bowel blockage--stomach cramping, unable  to have a bowel movement or pass gas, loss of appetite, vomiting Heart failure--shortness of breath, swelling of the ankles, feet, or hands, sudden weight gain, unusual weakness or fatigue Infection--fever, chills, cough, sore throat, wounds that don't heal, pain or trouble when passing urine, general feeling of discomfort or being unwell Liver injury--right upper belly pain, loss of appetite, nausea, light-colored stool, dark yellow or brown urine, yellowing skin or eyes, unusual weakness or fatigue Low blood pressure--dizziness, feeling faint or lightheaded, blurry vision Lung injury--shortness of breath or trouble breathing, cough, spitting up  blood, chest pain, fever Pain, tingling, or numbness in the hands or feet Severe or prolonged diarrhea Stomach pain, bloody diarrhea, pale skin, unusual weakness or fatigue, decrease in the amount of urine, which may be signs of hemolytic uremic syndrome Sudden and severe headache, confusion, change in vision, seizures, which may be signs of posterior reversible encephalopathy syndrome (PRES) TTP--purple spots on the skin or inside the mouth, pale skin, yellowing skin or eyes, unusual weakness or fatigue, fever, fast or irregular heartbeat, confusion, change in vision, trouble speaking, trouble walking Tumor lysis syndrome (TLS)--nausea, vomiting, diarrhea, decrease in the amount of urine, dark urine, unusual weakness or fatigue, confusion, muscle pain or cramps, fast or irregular heartbeat, joint pain Side effects that usually do not require medical attention (report to your care team if they continue or are bothersome): Constipation Diarrhea Fatigue Loss of appetite Nausea This list may not describe all possible side effects. Call your doctor for medical advice about side effects. You may report side effects to FDA at 1-800-FDA-1088. Where should I keep my medication? This medication is given in a hospital or clinic. It will not be stored at home. NOTE: This sheet is a summary. It may not cover all possible information. If you have questions about this medicine, talk to your doctor, pharmacist, or health care provider.  2024 Elsevier/Gold Standard (2022-01-28 00:00:00)

## 2023-08-11 LAB — KAPPA/LAMBDA LIGHT CHAINS
Kappa free light chain: 16.1 mg/L (ref 3.3–19.4)
Kappa, lambda light chain ratio: 1.15 (ref 0.26–1.65)
Lambda free light chains: 14 mg/L (ref 5.7–26.3)

## 2023-08-13 LAB — MULTIPLE MYELOMA PANEL, SERUM
Albumin SerPl Elph-Mcnc: 3.7 g/dL (ref 2.9–4.4)
Albumin/Glob SerPl: 1.7 (ref 0.7–1.7)
Alpha 1: 0.2 g/dL (ref 0.0–0.4)
Alpha2 Glob SerPl Elph-Mcnc: 0.7 g/dL (ref 0.4–1.0)
B-Globulin SerPl Elph-Mcnc: 0.9 g/dL (ref 0.7–1.3)
Gamma Glob SerPl Elph-Mcnc: 0.4 g/dL (ref 0.4–1.8)
Globulin, Total: 2.3 g/dL (ref 2.2–3.9)
IgA: 37 mg/dL — ABNORMAL LOW (ref 61–437)
IgG (Immunoglobin G), Serum: 454 mg/dL — ABNORMAL LOW (ref 603–1613)
IgM (Immunoglobulin M), Srm: 44 mg/dL (ref 15–143)
Total Protein ELP: 6 g/dL (ref 6.0–8.5)

## 2023-08-24 ENCOUNTER — Ambulatory Visit: Payer: Medicare Other | Admitting: Hematology and Oncology

## 2023-08-24 ENCOUNTER — Inpatient Hospital Stay: Payer: No Typology Code available for payment source

## 2023-08-24 VITALS — BP 122/66 | HR 55 | Temp 98.2°F | Resp 16

## 2023-08-24 DIAGNOSIS — C9000 Multiple myeloma not having achieved remission: Secondary | ICD-10-CM

## 2023-08-24 DIAGNOSIS — Z5112 Encounter for antineoplastic immunotherapy: Secondary | ICD-10-CM | POA: Diagnosis not present

## 2023-08-24 LAB — CMP (CANCER CENTER ONLY)
ALT: 20 U/L (ref 0–44)
AST: 24 U/L (ref 15–41)
Albumin: 4.3 g/dL (ref 3.5–5.0)
Alkaline Phosphatase: 53 U/L (ref 38–126)
Anion gap: 5 (ref 5–15)
BUN: 20 mg/dL (ref 8–23)
CO2: 27 mmol/L (ref 22–32)
Calcium: 9.1 mg/dL (ref 8.9–10.3)
Chloride: 108 mmol/L (ref 98–111)
Creatinine: 2.38 mg/dL — ABNORMAL HIGH (ref 0.61–1.24)
GFR, Estimated: 27 mL/min — ABNORMAL LOW (ref >60)
Glucose, Bld: 99 mg/dL (ref 70–99)
Potassium: 4.2 mmol/L (ref 3.5–5.1)
Sodium: 140 mmol/L (ref 135–145)
Total Bilirubin: 0.5 mg/dL (ref <1.2)
Total Protein: 6.4 g/dL — ABNORMAL LOW (ref 6.5–8.1)

## 2023-08-24 LAB — CBC WITH DIFFERENTIAL (CANCER CENTER ONLY)
Abs Immature Granulocytes: 0.01 10*3/uL (ref 0.00–0.07)
Basophils Absolute: 0.1 10*3/uL (ref 0.0–0.1)
Basophils Relative: 1 %
Eosinophils Absolute: 0.3 10*3/uL (ref 0.0–0.5)
Eosinophils Relative: 5 %
HCT: 30.7 % — ABNORMAL LOW (ref 39.0–52.0)
Hemoglobin: 10.1 g/dL — ABNORMAL LOW (ref 13.0–17.0)
Immature Granulocytes: 0 %
Lymphocytes Relative: 23 %
Lymphs Abs: 1.2 10*3/uL (ref 0.7–4.0)
MCH: 34 pg (ref 26.0–34.0)
MCHC: 32.9 g/dL (ref 30.0–36.0)
MCV: 103.4 fL — ABNORMAL HIGH (ref 80.0–100.0)
Monocytes Absolute: 0.8 10*3/uL (ref 0.1–1.0)
Monocytes Relative: 15 %
Neutro Abs: 2.8 10*3/uL (ref 1.7–7.7)
Neutrophils Relative %: 56 %
Platelet Count: 209 10*3/uL (ref 150–400)
RBC: 2.97 MIL/uL — ABNORMAL LOW (ref 4.22–5.81)
RDW: 14.9 % (ref 11.5–15.5)
WBC Count: 5.1 10*3/uL (ref 4.0–10.5)
nRBC: 0 % (ref 0.0–0.2)

## 2023-08-24 MED ORDER — BORTEZOMIB CHEMO SQ INJECTION 3.5 MG (2.5MG/ML)
1.3000 mg/m2 | Freq: Once | INTRAMUSCULAR | Status: AC
Start: 2023-08-24 — End: 2023-08-24
  Administered 2023-08-24: 3 mg via SUBCUTANEOUS
  Filled 2023-08-24: qty 1.2

## 2023-08-24 MED ORDER — PROCHLORPERAZINE MALEATE 10 MG PO TABS
10.0000 mg | ORAL_TABLET | Freq: Once | ORAL | Status: AC
  Administered 2023-08-24: 10 mg via ORAL
  Filled 2023-08-24: qty 1

## 2023-08-24 NOTE — Patient Instructions (Signed)
 [facility] - A DEPT OF [facility]  Discharge Instructions: Thank you for choosing [facility] to provide your oncology and hematology care.   If you have a lab appointment with the Cancer Center, please go directly to the Cancer Center and check in at the registration area.   Wear comfortable clothing and clothing appropriate for easy access to any Portacath or PICC line.   We strive to give you quality time with your provider. You may need to reschedule your appointment if you arrive late (15 or more minutes).  Arriving late affects you and other patients whose appointments are after yours.  Also, if you miss three or more appointments without notifying the office, you may be dismissed from the clinic at the provider's discretion.      For prescription refill requests, have your pharmacy contact our office and allow 72 hours for refills to be completed.    Today you received the following chemotherapy and/or immunotherapy agent: Bortezomib (Velcade)      To help prevent nausea and vomiting after your treatment, we encourage you to take your nausea medication as directed.  BELOW ARE SYMPTOMS THAT SHOULD BE REPORTED IMMEDIATELY: *FEVER GREATER THAN 100.4 F (38 C) OR HIGHER *CHILLS OR SWEATING *NAUSEA AND VOMITING THAT IS NOT CONTROLLED WITH YOUR NAUSEA MEDICATION *UNUSUAL SHORTNESS OF BREATH *UNUSUAL BRUISING OR BLEEDING *URINARY PROBLEMS (pain or burning when urinating, or frequent urination) *BOWEL PROBLEMS (unusual diarrhea, constipation, pain near the anus) TENDERNESS IN MOUTH AND THROAT WITH OR WITHOUT PRESENCE OF ULCERS (sore throat, sores in mouth, or a toothache) UNUSUAL RASH, SWELLING OR PAIN  UNUSUAL VAGINAL DISCHARGE OR ITCHING   Items with * indicate a potential emergency and should be followed up as soon as possible or go to the Emergency Department if any problems should occur.  Please show the CHEMOTHERAPY ALERT CARD or  IMMUNOTHERAPY ALERT CARD at check-in to the Emergency Department and triage nurse.  Should you have questions after your visit or need to cancel or reschedule your appointment, please contact [facility] - A DE[facility]  Dept: [phone]  and follow the prompts.  Office hours are 8:00 a.m. to 4:30 p.m. Monday - Friday. Please note that voicemails left after 4:00 p.m. may not be returned until the following business day.  We are closed weekends and major holidays. You have access to a nurse at all times for urgent questions. Please call the main number to the clinic Dept: [phone] and follow the prompts.   For any non-urgent questions, you may also contact your provider using MyChart. We now offer e-Visits for anyone 7 and older to request care online for non-urgent symptoms. For details visit [email or web address].   Also download the MyChart app! Go to the app store, search "MyChart", open the app, select [place], and log in with your MyChart username and password.  Bortezomib Injection What is this medication? BORTEZOMIB (bor TEZ oh mib) treats lymphoma. It may also be used to treat multiple myeloma, a type of bone marrow cancer. It works by blocking a protein that causes cancer cells to grow and multiply. This helps to slow or stop the spread of cancer cells. This medicine may be used for other purposes; ask your health care provider or pharmacist if you have questions. COMMON BRAND NAME(S): Velcade What should I tell my care team before I take this medication? They need to  know if you have any of these conditions: Dehydration Diabetes Heart disease Liver disease Tingling of the fingers or toes or other nerve disorder An unusual or allergic reaction to bortezomib, other medications, foods, dyes, or preservatives If you or your partner are pregnant or trying to get pregnant Breastfeeding How should I use this medication? This medication  is injected into a vein or under the skin. It is given by your care team in a hospital or clinic setting. Talk to your care team about the use of this medication in children. Special care may be needed. Overdosage: If you think you have taken too much of this medicine contact a poison control center or emergency room at once. NOTE: This medicine is only for you. Do not share this medicine with others. What if I miss a dose? Keep appointments for follow-up doses. It is important not to miss your dose. Call your care team if you are unable to keep an appointment. What may interact with this medication? Ketoconazole Rifampin This list may not describe all possible interactions. Give your health care provider a list of all the medicines, herbs, non-prescription drugs, or dietary supplements you use. Also tell them if you smoke, drink alcohol, or use illegal drugs. Some items may interact with your medicine. What should I watch for while using this medication? Your condition will be monitored carefully while you are receiving this medication. You may need blood work while taking this medication. This medication may affect your coordination, reaction time, or judgment. Do not drive or operate machinery until you know how this medication affects you. Sit up or stand slowly to reduce the risk of dizzy or fainting spells. Drinking alcohol with this medication can increase the risk of these side effects. This medication may increase your risk of getting an infection. Call your care team for advice if you get a fever, chills, sore throat, or other symptoms of a cold or flu. Do not treat yourself. Try to avoid being around people who are sick. Check with your care team if you have severe diarrhea, nausea, and vomiting, or if you sweat a lot. The loss of too much body fluid may make it dangerous for you to take this medication. Talk to your care team if you may be pregnant. Serious birth defects can occur if you  take this medication during pregnancy and for 7 months after the last dose. You will need a negative pregnancy test before starting this medication. Contraception is recommended while taking this medication and for 7 months after the last dose. Your care team can help you find the option that works for you. If your partner can get pregnant, use a condom during sex while taking this medication and for 4 months after the last dose. Do not breastfeed while taking this medication and for 2 months after the last dose. This medication may cause infertility. Talk to your care team if you are concerned about your fertility. What side effects may I notice from receiving this medication? Side effects that you should report to your care team as soon as possible: Allergic reactions--skin rash, itching, hives, swelling of the face, lips, tongue, or throat Bleeding--bloody or black, tar-like stools, vomiting blood or brown material that looks like coffee grounds, red or dark brown urine, small red or purple spots on skin, unusual bruising or bleeding Bleeding in the brain--severe headache, stiff neck, confusion, dizziness, change in vision, numbness or weakness of the face, arm, or leg, trouble speaking, trouble walking,  vomiting Bowel blockage--stomach cramping, unable to have a bowel movement or pass gas, loss of appetite, vomiting Heart failure--shortness of breath, swelling of the ankles, feet, or hands, sudden weight gain, unusual weakness or fatigue Infection--fever, chills, cough, sore throat, wounds that don't heal, pain or trouble when passing urine, general feeling of discomfort or being unwell Liver injury--right upper belly pain, loss of appetite, nausea, light-colored stool, dark yellow or brown urine, yellowing skin or eyes, unusual weakness or fatigue Low blood pressure--dizziness, feeling faint or lightheaded, blurry vision Lung injury--shortness of breath or trouble breathing, cough, spitting up  blood, chest pain, fever Pain, tingling, or numbness in the hands or feet Severe or prolonged diarrhea Stomach pain, bloody diarrhea, pale skin, unusual weakness or fatigue, decrease in the amount of urine, which may be signs of hemolytic uremic syndrome Sudden and severe headache, confusion, change in vision, seizures, which may be signs of posterior reversible encephalopathy syndrome (PRES) TTP--purple spots on the skin or inside the mouth, pale skin, yellowing skin or eyes, unusual weakness or fatigue, fever, fast or irregular heartbeat, confusion, change in vision, trouble speaking, trouble walking Tumor lysis syndrome (TLS)--nausea, vomiting, diarrhea, decrease in the amount of urine, dark urine, unusual weakness or fatigue, confusion, muscle pain or cramps, fast or irregular heartbeat, joint pain Side effects that usually do not require medical attention (report to your care team if they continue or are bothersome): Constipation Diarrhea Fatigue Loss of appetite Nausea This list may not describe all possible side effects. Call your doctor for medical advice about side effects. You may report side effects to FDA at 1-800-FDA-1088. Where should I keep my medication? This medication is given in a hospital or clinic. It will not be stored at home. NOTE: This sheet is a summary. It may not cover all possible information. If you have questions about this medicine, talk to your doctor, pharmacist, or health care provider.  2024 [employer]/Gold Standard (2022-01-28 00:00:00)

## 2023-08-25 LAB — KAPPA/LAMBDA LIGHT CHAINS
Kappa free light chain: 14.9 mg/L (ref 3.3–19.4)
Kappa, lambda light chain ratio: 1.22 (ref 0.26–1.65)
Lambda free light chains: 12.2 mg/L (ref 5.7–26.3)

## 2023-09-02 LAB — MULTIPLE MYELOMA PANEL, SERUM
Albumin SerPl Elph-Mcnc: 3.7 g/dL (ref 2.9–4.4)
Albumin/Glob SerPl: 1.5 (ref 0.7–1.7)
Alpha 1: 0.3 g/dL (ref 0.0–0.4)
Alpha2 Glob SerPl Elph-Mcnc: 0.8 g/dL (ref 0.4–1.0)
B-Globulin SerPl Elph-Mcnc: 1 g/dL (ref 0.7–1.3)
Gamma Glob SerPl Elph-Mcnc: 0.4 g/dL (ref 0.4–1.8)
Globulin, Total: 2.5 g/dL (ref 2.2–3.9)
IgA: 38 mg/dL — ABNORMAL LOW (ref 61–437)
IgG (Immunoglobin G), Serum: 463 mg/dL — ABNORMAL LOW (ref 603–1613)
IgM (Immunoglobulin M), Srm: 41 mg/dL (ref 15–143)
Total Protein ELP: 6.2 g/dL (ref 6.0–8.5)

## 2023-09-06 NOTE — Progress Notes (Unsigned)
[facility] [phone]   [phone]  PROGRESS NOTE  Patient Care Team: [name], MD as PCP - General (Internal Medicine)  Hematological/Oncological History # IgA Lambda Multiple Myeloma 05/03/2015: last visit with Dr. Arbutus Ped at the [facility]. Was followed for IgA lambda MGUS. 12/11/2021: labs show M protein 3.4, Kappa 19.2, Lambda 1576.4, ratio 0.01. Cr 3.47, Hgb 8.6, WBC 5.7, MCV 99, Plt 221 12/23/2021: establish care with Dr. Leonides Schanz  01/16/2022: Bone marrow biopsy performed, showed a 60% cellular bone marrow predominantly comprised of plasma cells making up 70 to 80%, lambda restricted.  Myeloma FISH panel showed no evidence of abnormalities. 02/07/2022: Cycle 1 Day 1 of CyBorD chemotherapy.  03/09/2022-03/17/2022: hospitalized for fever/pneumonia.  03/24/2022: Cycle 2 Day 1 of CyBorD chemotherapy.  04/22/2022: Cycle 3 Day 1 of CyBorD chemotherapy 05/19/2022-05/26/2022: Delayed start of Cycle 4 Day 1 of CyBorD chemotherapy due to neutropenia.  06/03/2022: Cycle 4 Day 1 of CyBorD chemotherapy. 25% dose reduction of cyclophosphamide due to cytopenias. 06/30/2022: Cycle 4 Day 22. Drop Velcade dose to 1mg /m2 and Cyclophosphamide 200 mg/m2  07/07/2022: Cycle 5 Day 1 of CyBorD chemotherapy with dose reductions.  08/04/2022: Cycle 6 Day 1 of CyBorD chemotherapy with dose reductions.  09/09/2022: transition to maintenance dose Velcade q 2 weeks.   Interval History:  Jane Scroggin. 77 y.o. male with medical history significant for IgA lambda multiple myeloma who presents for a follow up visit. The patient's last visit was on 08/10/2023. In the interim since the last visit he has continued on maintenance Velcade.   On exam today Mr. Rumpf reports that he unexpectedly lost his 63 year old brother last week.  He notes that he was very close with his brother and lives close by.  He is deeply saddened by this.  He notes it is weighing on him heavily.  He  notes that healthwise he has been all right though he recently had a cold with a runny nose and cough.  He notes with some Mucinex it has been resolving and is almost completely gone.  He notes that he is tolerating his Velcade well with no major side effects.  He is not having any nausea, vomiting or diarrhea.  He reports his bowels move nightly though sometimes he does have some "gas buildup".  He notes that his appetite has been good and his energy is steady.  He denies any numbness or tingling of his fingers or toes.  He notes he is willing and able to proceed with Velcade at this time.  He denies fevers, chills, sweats, shortness of breath, chest pain or cough. He has no other complaints. Rest of the 10 point ROS is below.   MEDICAL HISTORY:  Past Medical History:  Diagnosis Date   Allergy    Anxiety    Arthritis    Depression    Heart murmur    Hyperlipidemia    Hyperplastic colon polyp    Hyperproteinemia    Hypertension    Hypothyroidism    Kidney disease    Mitral valve prolapse    Multiple myeloma (HCC)    Paroxysmal atrial fibrillation (HCC)    Thyroid disease     SURGICAL HISTORY: Past Surgical History:  Procedure Laterality Date   CARDIOVERSION N/A 03/14/2022   Procedure: CARDIOVERSION;  Surgeon: Chrystie Nose, MD;  Location: [facility] ENDOSCOPY;  Service: Cardiovascular;  Laterality: N/A;   TEE WITHOUT CARDIOVERSION N/A 03/14/2022   Procedure: TRANSESOPHAGEAL ECHOCARDIOGRAM (TEE);  Surgeon: Zoila Shutter  C, MD;  Location: [facility] ENDOSCOPY;  Service: Cardiovascular;  Laterality: N/A;    SOCIAL HISTORY: Social History   Socioeconomic History   Marital status: Married    Spouse name: Bonita Quin   Number of children: 2   Years of education: Not on file   Highest education level: Not on file  Occupational History   Occupation: retired  Tobacco Use   Smoking status: Former    Current packs/day: 0.00    Types: Cigarettes    Start date: 10/01/1958    Quit date: 10/01/1978    Years  since quitting: 44.9   Smokeless tobacco: Never   Tobacco comments:    Former smoker 04/03/22  Vaping Use   Vaping status: Never Used  Substance and Sexual Activity   Alcohol use: Not Currently    Alcohol/week: 2.0 standard drinks of alcohol    Types: 2 Glasses of wine per week    Comment: None in many years   Drug use: No   Sexual activity: Not on file  Other Topics Concern   Not on file  Social History Narrative   Not on file   Social Drivers of Health   Financial Resource Strain: Not on file  Food Insecurity: Not on file  Transportation Needs: Not on file  Physical Activity: Not on file  Stress: Not on file  Social Connections: Not on file  Intimate Partner Violence: Not on file    FAMILY HISTORY: Family History  Problem Relation Age of Onset   Kidney disease Mother    Prostate cancer Father    Kidney disease Brother    Colon cancer Neg Hx    Esophageal cancer Neg Hx    Liver cancer Neg Hx    Pancreatic cancer Neg Hx    Rectal cancer Neg Hx    Stomach cancer Neg Hx     ALLERGIES:  is allergic to zithromax [azithromycin].  MEDICATIONS:  Current Outpatient Medications  Medication Sig Dispense Refill   acyclovir (ZOVIRAX) 400 MG tablet TAKE 1 TABLET BY MOUTH TWICE A DAY 180 tablet 1   allopurinol (ZYLOPRIM) 300 MG tablet TAKE 1 TABLET BY MOUTH EVERY DAY 90 tablet 1   ALPRAZolam (XANAX) 0.25 MG tablet Take 0.25 mg by mouth 2 (two) times daily as needed for anxiety.     amiodarone (PACERONE) 200 MG tablet TAKE 1 TABLET BY MOUTH EVERY DAY 90 tablet 3   amLODipine (NORVASC) 5 MG tablet TAKE 1 TABLET (5 MG TOTAL) BY MOUTH DAILY. 90 tablet 3   apixaban (ELIQUIS) 5 MG TABS tablet Take 1 tablet (5 mg total) by mouth 2 (two) times daily. 180 tablet 1   dexamethasone (DECADRON) 4 MG tablet TAKE 10 TABLETS (40 MG TOTAL) BY MOUTH ONCE A WEEK IN THE MORNING ON CHEMOTHERAPY DAYS 120 tablet 1   ferrous gluconate (FERGON) 324 MG tablet Take 1 tablet (324 mg total) by mouth 3  (three) times daily with meals. 90 tablet 0   fluticasone (FLONASE) 50 MCG/ACT nasal spray Place 2 sprays into the nose daily as needed for allergies.     gabapentin (NEURONTIN) 300 MG capsule TAKE 1 CAPSULE BY MOUTH TWICE A DAY 60 capsule 1   levothyroxine (SYNTHROID) 100 MCG tablet Take 100 mcg by mouth See admin instructions. Take 1 tablet (100 mcg) by mouth on Mondays, Tuesdays, Wednesdays, Thursdays, Fridays & Saturdays in the morning. Skip a dose on Sundays.     loratadine (CLARITIN) 10 MG tablet Take 10 mg by mouth daily as  needed for allergies.     meclizine (ANTIVERT) 25 MG tablet Take 1 tablet (25 mg total) by mouth 3 (three) times daily as needed for dizziness. 30 tablet 0   metoprolol succinate (TOPROL-XL) 25 MG 24 hr tablet Take 1 tablet (25 mg total) by mouth daily. Take with or immediately following a meal. 30 tablet 3   Olmesartan-amLODIPine-HCTZ 20-5-12.5 MG TABS Take 1 tablet by mouth daily.     prochlorperazine (COMPAZINE) 10 MG tablet Take 1 tablet (10 mg total) by mouth every 6 (six) hours as needed for nausea or vomiting. 30 tablet 0   rosuvastatin (CRESTOR) 20 MG tablet 1 tablet Orally Once a day for 90 days     senna-docusate (SENOKOT-S) 8.6-50 MG tablet Take 1 tablet by mouth at bedtime as needed for mild constipation.     traMADol (ULTRAM) 50 MG tablet Take 1 tablet (50 mg total) by mouth every 6 (six) hours as needed. 30 tablet 0   vitamin B-12 (CYANOCOBALAMIN) 500 MCG tablet Take 500 mcg by mouth in the morning.     Current Facility-Administered Medications  Medication Dose Route Frequency Provider Last Rate Last Admin   0.9 %  sodium chloride infusion  500 mL Intravenous Once Armbruster, Willaim Rayas, MD       0.9 %  sodium chloride infusion  500 mL Intravenous Once Armbruster, Willaim Rayas, MD        REVIEW OF SYSTEMS:   Constitutional: ( - ) fevers, ( - )  chills , ( - ) night sweats Eyes: ( - ) blurriness of vision, ( - ) double vision, ( - ) watery eyes Ears, nose,  mouth, throat, and face: ( - ) mucositis, ( - ) sore throat Respiratory: ( - ) cough, ( - ) dyspnea, ( - ) wheezes Cardiovascular: ( - ) palpitation, ( - ) chest discomfort, (+ ) lower extremity swelling Gastrointestinal:  ( - ) nausea, ( - ) heartburn, ( - ) change in bowel habits Skin: ( - ) abnormal skin rashes Lymphatics: ( - ) new lymphadenopathy, ( - ) easy bruising Neurological: ( + ) numbness, ( - ) tingling, ( - ) new weaknesses Behavioral/Psych: ( - ) mood change, ( - ) new changes  All other systems were reviewed with the patient and are negative.  PHYSICAL EXAMINATION: ECOG PERFORMANCE STATUS: 1 - Symptomatic but completely ambulatory  Vitals:   09/07/23 0915  BP: 128/69  Pulse: 61  Resp: 13  Temp: 97.9 F (36.6 C)  SpO2: 100%       Filed Weights   09/07/23 0915  Weight: 246 lb 8 oz (111.8 kg)        GENERAL: Chronically ill-appearing elderly African-American male, alert, no distress and comfortable SKIN: skin color, texture, turgor are normal, no rashes or significant lesions EYES: conjunctiva are pink and non-injected, sclera clear LUNGS: clear to auscultation and percussion with normal breathing effort HEART: regular rate & rhythm and no murmurs and + 2 bilateral ankle edema Musculoskeletal: no cyanosis of digits and no clubbing  PSYCH: alert & oriented x 3, fluent speech NEURO: no focal motor/sensory deficits  LABORATORY DATA:  I have reviewed the data as listed    Latest Ref Rng & Units 09/07/2023    8:37 AM 08/24/2023    8:32 AM 08/10/2023    8:41 AM  CBC  WBC 4.0 - 10.5 K/uL 8.7  5.1  4.3   Hemoglobin 13.0 - 17.0 g/dL 9.3  16.1  09.6  Hematocrit 39.0 - 52.0 % 28.6  30.7  31.8   Platelets 150 - 400 K/uL 204  209  200        Latest Ref Rng & Units 09/07/2023    8:37 AM 08/24/2023    8:32 AM 08/10/2023    8:41 AM  CMP  Glucose 70 - 99 mg/dL 130  99  93   BUN 8 - 23 mg/dL 23  20  20    Creatinine 0.61 - 1.24 mg/dL 8.65  7.84  6.96    Sodium 135 - 145 mmol/L 141  140  140   Potassium 3.5 - 5.1 mmol/L 4.2  4.2  4.1   Chloride 98 - 111 mmol/L 108  108  106   CO2 22 - 32 mmol/L 25  27  27    Calcium 8.9 - 10.3 mg/dL 9.2  9.1  9.5   Total Protein 6.5 - 8.1 g/dL 6.5  6.4  6.5   Total Bilirubin <1.2 mg/dL 0.5  0.5  0.5   Alkaline Phos 38 - 126 U/L 49  53  54   AST 15 - 41 U/L 22  24  21    ALT 0 - 44 U/L 21  20  20      Lab Results  Component Value Date   MPROTEIN Not Observed 08/24/2023   MPROTEIN Not Observed 08/10/2023   MPROTEIN Not Observed 07/27/2023   Lab Results  Component Value Date   KPAFRELGTCHN 14.9 08/24/2023   KPAFRELGTCHN 16.1 08/10/2023   KPAFRELGTCHN 18.2 07/27/2023   LAMBDASER 12.2 08/24/2023   LAMBDASER 14.0 08/10/2023   LAMBDASER 13.7 07/27/2023   KAPLAMBRATIO 1.22 08/24/2023   KAPLAMBRATIO 1.15 08/10/2023   KAPLAMBRATIO 1.33 07/27/2023    RADIOGRAPHIC STUDIES: No results found.  ASSESSMENT & PLAN [name] Is a 77 y.o. male with medical history significant for IgA lambda multiple myeloma who presents for a follow up visit. .    # IgA lambda Multiple Myeloma -- Bone marrow biopsy performed on 01/16/2022 showed increased plasma cells consistent with a plasma cell neoplasm.  Normal FISH panel. --Patient meets diagnostic criteria based on kidney dysfunction and anemia. --Cycle 1 Day 1 of CyBorD chemotherapy started on 02/07/2022 --Velcade maintenance therapy started on 09/09/2022.  Plan: --Most recent SPEP from 08/24/2023 showed M protein not detected,  kappa 14.9, lambda 12.2 with normal ratio. --Labs today show white blood cell 8.7, hemoglobin 9.3, MCV 102.5, platelets 204. Creatinine overall stable at 2.47. Calcium level is normal. LFTs normal. Myeloma panel pending.  --continue on maintenance Velcade every 2 weeks with monthly clinic visits.  # Leg Pain -- Likely a component of worsening neuropathy from his chemotherapy with pre-existing sciatica pain. -- continue gabapentin to  300 mg twice daily -- continue tramadol 50 mg every 6 hours as needed -- patient connected with Dr. Barbaraann Cao  #Supportive Care -- chemotherapy education complete -- port placement not required.  -- zofran 8mg  q8H PRN and compazine 10mg  PO q6H for nausea -- acyclovir 400mg  PO BID for VCZ prophylaxis --zometa to start after dental clearance.   Orders Placed This Encounter  Procedures   Multiple Myeloma Panel (SPEP&IFE w/QIG)    Standing Status:   Future    Expected Date:   10/05/2023    Expiration Date:   10/04/2024   Kappa/lambda light chains    Standing Status:   Future    Expected Date:   10/05/2023    Expiration Date:   10/04/2024   CBC with Differential (Cancer  Center Only)    Standing Status:   Future    Expected Date:   10/05/2023    Expiration Date:   10/04/2024   CMP (Cancer Center only)    Standing Status:   Future    Expected Date:   10/05/2023    Expiration Date:   10/04/2024   Multiple Myeloma Panel (SPEP&IFE w/QIG)    Standing Status:   Future    Expected Date:   10/19/2023    Expiration Date:   10/18/2024   Kappa/lambda light chains    Standing Status:   Future    Expected Date:   10/19/2023    Expiration Date:   10/18/2024   CBC with Differential (Cancer Center Only)    Standing Status:   Future    Expected Date:   10/19/2023    Expiration Date:   10/18/2024   CMP (Cancer Center only)    Standing Status:   Future    Expected Date:   10/19/2023    Expiration Date:   10/18/2024   Multiple Myeloma Panel (SPEP&IFE w/QIG)    Standing Status:   Future    Expected Date:   11/02/2023    Expiration Date:   11/01/2024   Kappa/lambda light chains    Standing Status:   Future    Expected Date:   11/02/2023    Expiration Date:   11/01/2024   CBC with Differential (Cancer Center Only)    Standing Status:   Future    Expected Date:   11/02/2023    Expiration Date:   11/01/2024   CMP (Cancer Center only)    Standing Status:   Future    Expected Date:   11/02/2023    Expiration Date:    11/01/2024   Multiple Myeloma Panel (SPEP&IFE w/QIG)    Standing Status:   Future    Expected Date:   11/16/2023    Expiration Date:   11/15/2024   Kappa/lambda light chains    Standing Status:   Future    Expected Date:   11/16/2023    Expiration Date:   11/15/2024   CBC with Differential (Cancer Center Only)    Standing Status:   Future    Expected Date:   11/16/2023    Expiration Date:   11/15/2024   CMP (Cancer Center only)    Standing Status:   Future    Expected Date:   11/16/2023    Expiration Date:   11/15/2024   Multiple Myeloma Panel (SPEP&IFE w/QIG)    Standing Status:   Future    Expected Date:   11/30/2023    Expiration Date:   11/29/2024   Kappa/lambda light chains    Standing Status:   Future    Expected Date:   11/30/2023    Expiration Date:   11/29/2024   CBC with Differential (Cancer Center Only)    Standing Status:   Future    Expected Date:   11/30/2023    Expiration Date:   11/29/2024   CMP (Cancer Center only)    Standing Status:   Future    Expected Date:   11/30/2023    Expiration Date:   11/29/2024   Multiple Myeloma Panel (SPEP&IFE w/QIG)    Standing Status:   Future    Expected Date:   12/14/2023    Expiration Date:   12/13/2024   Kappa/lambda light chains    Standing Status:   Future    Expected Date:   12/14/2023  Expiration Date:   12/13/2024   CBC with Differential (Cancer Center Only)    Standing Status:   Future    Expected Date:   12/14/2023    Expiration Date:   12/13/2024   CMP (Cancer Center only)    Standing Status:   Future    Expected Date:   12/14/2023    Expiration Date:   12/13/2024   All questions were answered. The patient knows to call the clinic with any problems, questions or concerns.  I have spent a total of 30 minutes minutes of face-to-face and non-face-to-face time, preparing to see the patient, performing a medically appropriate examination, counseling and educating the patient, documenting clinical information in the electronic health  record,  and care coordination.   Ulysees Barns, MD Department of Hematology/Oncology [facility] at [facility] Phone: [phone] Pager: [phone] Email: [email or web address]  09/07/2023 9:30 AM

## 2023-09-07 ENCOUNTER — Inpatient Hospital Stay: Payer: No Typology Code available for payment source | Admitting: Hematology and Oncology

## 2023-09-07 ENCOUNTER — Inpatient Hospital Stay: Payer: No Typology Code available for payment source

## 2023-09-07 VITALS — BP 113/55 | HR 58 | Temp 98.6°F | Resp 16

## 2023-09-07 VITALS — BP 128/69 | HR 61 | Temp 97.9°F | Resp 13 | Wt 246.5 lb

## 2023-09-07 DIAGNOSIS — Z5112 Encounter for antineoplastic immunotherapy: Secondary | ICD-10-CM | POA: Diagnosis not present

## 2023-09-07 DIAGNOSIS — C9000 Multiple myeloma not having achieved remission: Secondary | ICD-10-CM | POA: Diagnosis not present

## 2023-09-07 LAB — CMP (CANCER CENTER ONLY)
ALT: 21 U/L (ref 0–44)
AST: 22 U/L (ref 15–41)
Albumin: 4 g/dL (ref 3.5–5.0)
Alkaline Phosphatase: 49 U/L (ref 38–126)
Anion gap: 8 (ref 5–15)
BUN: 23 mg/dL (ref 8–23)
CO2: 25 mmol/L (ref 22–32)
Calcium: 9.2 mg/dL (ref 8.9–10.3)
Chloride: 108 mmol/L (ref 98–111)
Creatinine: 2.47 mg/dL — ABNORMAL HIGH (ref 0.61–1.24)
GFR, Estimated: 26 mL/min — ABNORMAL LOW (ref >60)
Glucose, Bld: 105 mg/dL — ABNORMAL HIGH (ref 70–99)
Potassium: 4.2 mmol/L (ref 3.5–5.1)
Sodium: 141 mmol/L (ref 135–145)
Total Bilirubin: 0.5 mg/dL (ref <1.2)
Total Protein: 6.5 g/dL (ref 6.5–8.1)

## 2023-09-07 LAB — CBC WITH DIFFERENTIAL (CANCER CENTER ONLY)
Abs Immature Granulocytes: 0.04 10*3/uL (ref 0.00–0.07)
Basophils Absolute: 0 10*3/uL (ref 0.0–0.1)
Basophils Relative: 0 %
Eosinophils Absolute: 0.3 10*3/uL (ref 0.0–0.5)
Eosinophils Relative: 3 %
HCT: 28.6 % — ABNORMAL LOW (ref 39.0–52.0)
Hemoglobin: 9.3 g/dL — ABNORMAL LOW (ref 13.0–17.0)
Immature Granulocytes: 1 %
Lymphocytes Relative: 15 %
Lymphs Abs: 1.3 10*3/uL (ref 0.7–4.0)
MCH: 33.3 pg (ref 26.0–34.0)
MCHC: 32.5 g/dL (ref 30.0–36.0)
MCV: 102.5 fL — ABNORMAL HIGH (ref 80.0–100.0)
Monocytes Absolute: 1.1 10*3/uL — ABNORMAL HIGH (ref 0.1–1.0)
Monocytes Relative: 13 %
Neutro Abs: 5.9 10*3/uL (ref 1.7–7.7)
Neutrophils Relative %: 68 %
Platelet Count: 204 10*3/uL (ref 150–400)
RBC: 2.79 MIL/uL — ABNORMAL LOW (ref 4.22–5.81)
RDW: 15 % (ref 11.5–15.5)
WBC Count: 8.7 10*3/uL (ref 4.0–10.5)
nRBC: 0 % (ref 0.0–0.2)

## 2023-09-07 MED ORDER — PROCHLORPERAZINE MALEATE 10 MG PO TABS
10.0000 mg | ORAL_TABLET | Freq: Once | ORAL | Status: AC
  Administered 2023-09-07: 10 mg via ORAL
  Filled 2023-09-07: qty 1

## 2023-09-07 MED ORDER — BORTEZOMIB CHEMO SQ INJECTION 3.5 MG (2.5MG/ML)
1.3000 mg/m2 | Freq: Once | INTRAMUSCULAR | Status: AC
  Administered 2023-09-07: 3 mg via SUBCUTANEOUS
  Filled 2023-09-07: qty 1.2

## 2023-09-07 NOTE — Progress Notes (Signed)
Per Dr. Leonides Schanz, ok for treatment today with serum creatine 2.47 mg/dL

## 2023-09-07 NOTE — Patient Instructions (Signed)
[facility] - A DEPT OF [facility]  Discharge Instructions: Thank you for choosing [facility] to provide your oncology and hematology care.   If you have a lab appointment with the Cancer Center, please go directly to the Cancer Center and check in at the registration area.   Wear comfortable clothing and clothing appropriate for easy access to any Portacath or PICC line.   We strive to give you quality time with your provider. You may need to reschedule your appointment if you arrive late (15 or more minutes).  Arriving late affects you and other patients whose appointments are after yours.  Also, if you miss three or more appointments without notifying the office, you may be dismissed from the clinic at the provider's discretion.      For prescription refill requests, have your pharmacy contact our office and allow 72 hours for refills to be completed.    Today you received the following chemotherapy and/or immunotherapy agent: Bortezomib (Velcade)      To help prevent nausea and vomiting after your treatment, we encourage you to take your nausea medication as directed.  BELOW ARE SYMPTOMS THAT SHOULD BE REPORTED IMMEDIATELY: *FEVER GREATER THAN 100.4 F (38 C) OR HIGHER *CHILLS OR SWEATING *NAUSEA AND VOMITING THAT IS NOT CONTROLLED WITH YOUR NAUSEA MEDICATION *UNUSUAL SHORTNESS OF BREATH *UNUSUAL BRUISING OR BLEEDING *URINARY PROBLEMS (pain or burning when urinating, or frequent urination) *BOWEL PROBLEMS (unusual diarrhea, constipation, pain near the anus) TENDERNESS IN MOUTH AND THROAT WITH OR WITHOUT PRESENCE OF ULCERS (sore throat, sores in mouth, or a toothache) UNUSUAL RASH, SWELLING OR PAIN  UNUSUAL VAGINAL DISCHARGE OR ITCHING   Items with * indicate a potential emergency and should be followed up as soon as possible or go to the Emergency Department if any problems should occur.  Please show the CHEMOTHERAPY ALERT CARD or  IMMUNOTHERAPY ALERT CARD at check-in to the Emergency Department and triage nurse.  Should you have questions after your visit or need to cancel or reschedule your appointment, please contact [facility] - [facility]  Dept: [phone]  and follow the prompts.  Office hours are 8:00 a.m. to 4:30 p.m. Monday - Friday. Please note that voicemails left after 4:00 p.m. may not be returned until the following business day.  We are closed weekends and major holidays. You have access to a nurse at all times for urgent questions. Please call the main number to the clinic Dept: [phone] and follow the prompts.   For any non-urgent questions, you may also contact your provider using MyChart. We now offer e-Visits for anyone 55 and older to request care online for non-urgent symptoms. For details visit [email or web address].   Also download the MyChart app! Go to the app store, search "MyChart", open the app, select [place], and log in with your MyChart username and password.

## 2023-09-08 ENCOUNTER — Other Ambulatory Visit: Payer: Self-pay

## 2023-09-08 LAB — KAPPA/LAMBDA LIGHT CHAINS
Kappa free light chain: 16.9 mg/L (ref 3.3–19.4)
Kappa, lambda light chain ratio: 1.22 (ref 0.26–1.65)
Lambda free light chains: 13.8 mg/L (ref 5.7–26.3)

## 2023-09-15 LAB — MULTIPLE MYELOMA PANEL, SERUM
Albumin SerPl Elph-Mcnc: 3.4 g/dL (ref 2.9–4.4)
Albumin/Glob SerPl: 1.5 (ref 0.7–1.7)
Alpha 1: 0.3 g/dL (ref 0.0–0.4)
Alpha2 Glob SerPl Elph-Mcnc: 0.7 g/dL (ref 0.4–1.0)
B-Globulin SerPl Elph-Mcnc: 0.9 g/dL (ref 0.7–1.3)
Gamma Glob SerPl Elph-Mcnc: 0.4 g/dL (ref 0.4–1.8)
Globulin, Total: 2.3 g/dL (ref 2.2–3.9)
IgA: 34 mg/dL — ABNORMAL LOW (ref 61–437)
IgG (Immunoglobin G), Serum: 426 mg/dL — ABNORMAL LOW (ref 603–1613)
IgM (Immunoglobulin M), Srm: 45 mg/dL (ref 15–143)
Total Protein ELP: 5.7 g/dL — ABNORMAL LOW (ref 6.0–8.5)

## 2023-09-21 ENCOUNTER — Ambulatory Visit: Payer: Medicare Other | Admitting: Nurse Practitioner

## 2023-09-21 ENCOUNTER — Inpatient Hospital Stay: Payer: No Typology Code available for payment source | Attending: Hematology and Oncology

## 2023-09-21 ENCOUNTER — Inpatient Hospital Stay: Payer: No Typology Code available for payment source

## 2023-09-21 VITALS — BP 130/61 | HR 51 | Temp 98.7°F | Resp 18 | Wt 245.8 lb

## 2023-09-21 DIAGNOSIS — G629 Polyneuropathy, unspecified: Secondary | ICD-10-CM | POA: Insufficient documentation

## 2023-09-21 DIAGNOSIS — Z87891 Personal history of nicotine dependence: Secondary | ICD-10-CM | POA: Insufficient documentation

## 2023-09-21 DIAGNOSIS — G620 Drug-induced polyneuropathy: Secondary | ICD-10-CM | POA: Insufficient documentation

## 2023-09-21 DIAGNOSIS — Z79899 Other long term (current) drug therapy: Secondary | ICD-10-CM | POA: Diagnosis not present

## 2023-09-21 DIAGNOSIS — D649 Anemia, unspecified: Secondary | ICD-10-CM | POA: Insufficient documentation

## 2023-09-21 DIAGNOSIS — Z860102 Personal history of hyperplastic colon polyps: Secondary | ICD-10-CM | POA: Insufficient documentation

## 2023-09-21 DIAGNOSIS — G8929 Other chronic pain: Secondary | ICD-10-CM | POA: Diagnosis not present

## 2023-09-21 DIAGNOSIS — I341 Nonrheumatic mitral (valve) prolapse: Secondary | ICD-10-CM | POA: Diagnosis not present

## 2023-09-21 DIAGNOSIS — T451X5A Adverse effect of antineoplastic and immunosuppressive drugs, initial encounter: Secondary | ICD-10-CM | POA: Diagnosis not present

## 2023-09-21 DIAGNOSIS — Z881 Allergy status to other antibiotic agents status: Secondary | ICD-10-CM | POA: Insufficient documentation

## 2023-09-21 DIAGNOSIS — Z5112 Encounter for antineoplastic immunotherapy: Secondary | ICD-10-CM | POA: Diagnosis present

## 2023-09-21 DIAGNOSIS — Z7901 Long term (current) use of anticoagulants: Secondary | ICD-10-CM | POA: Diagnosis not present

## 2023-09-21 DIAGNOSIS — C9000 Multiple myeloma not having achieved remission: Secondary | ICD-10-CM | POA: Insufficient documentation

## 2023-09-21 DIAGNOSIS — I4891 Unspecified atrial fibrillation: Secondary | ICD-10-CM | POA: Insufficient documentation

## 2023-09-21 DIAGNOSIS — Z7969 Long term (current) use of other immunomodulators and immunosuppressants: Secondary | ICD-10-CM | POA: Insufficient documentation

## 2023-09-21 DIAGNOSIS — M549 Dorsalgia, unspecified: Secondary | ICD-10-CM | POA: Insufficient documentation

## 2023-09-21 LAB — CBC WITH DIFFERENTIAL (CANCER CENTER ONLY)
Abs Immature Granulocytes: 0.01 10*3/uL (ref 0.00–0.07)
Basophils Absolute: 0.1 10*3/uL (ref 0.0–0.1)
Basophils Relative: 1 %
Eosinophils Absolute: 0.3 10*3/uL (ref 0.0–0.5)
Eosinophils Relative: 6 %
HCT: 29.7 % — ABNORMAL LOW (ref 39.0–52.0)
Hemoglobin: 9.5 g/dL — ABNORMAL LOW (ref 13.0–17.0)
Immature Granulocytes: 0 %
Lymphocytes Relative: 19 %
Lymphs Abs: 0.9 10*3/uL (ref 0.7–4.0)
MCH: 32.8 pg (ref 26.0–34.0)
MCHC: 32 g/dL (ref 30.0–36.0)
MCV: 102.4 fL — ABNORMAL HIGH (ref 80.0–100.0)
Monocytes Absolute: 0.8 10*3/uL (ref 0.1–1.0)
Monocytes Relative: 17 %
Neutro Abs: 2.8 10*3/uL (ref 1.7–7.7)
Neutrophils Relative %: 57 %
Platelet Count: 279 10*3/uL (ref 150–400)
RBC: 2.9 MIL/uL — ABNORMAL LOW (ref 4.22–5.81)
RDW: 15.8 % — ABNORMAL HIGH (ref 11.5–15.5)
WBC Count: 4.9 10*3/uL (ref 4.0–10.5)
nRBC: 0 % (ref 0.0–0.2)

## 2023-09-21 LAB — CMP (CANCER CENTER ONLY)
ALT: 20 U/L (ref 0–44)
AST: 21 U/L (ref 15–41)
Albumin: 4.1 g/dL (ref 3.5–5.0)
Alkaline Phosphatase: 49 U/L (ref 38–126)
Anion gap: 7 (ref 5–15)
BUN: 23 mg/dL (ref 8–23)
CO2: 25 mmol/L (ref 22–32)
Calcium: 9.1 mg/dL (ref 8.9–10.3)
Chloride: 108 mmol/L (ref 98–111)
Creatinine: 2.79 mg/dL — ABNORMAL HIGH (ref 0.61–1.24)
GFR, Estimated: 22 mL/min — ABNORMAL LOW (ref >60)
Glucose, Bld: 94 mg/dL (ref 70–99)
Potassium: 4.7 mmol/L (ref 3.5–5.1)
Sodium: 140 mmol/L (ref 135–145)
Total Bilirubin: 0.4 mg/dL (ref 0.0–1.2)
Total Protein: 6.3 g/dL — ABNORMAL LOW (ref 6.5–8.1)

## 2023-09-21 MED ORDER — BORTEZOMIB CHEMO SQ INJECTION 3.5 MG (2.5MG/ML)
1.3000 mg/m2 | Freq: Once | INTRAMUSCULAR | Status: AC
Start: 2023-09-21 — End: 2023-09-21
  Administered 2023-09-21: 3 mg via SUBCUTANEOUS
  Filled 2023-09-21: qty 1.2

## 2023-09-21 MED ORDER — PROCHLORPERAZINE MALEATE 10 MG PO TABS
10.0000 mg | ORAL_TABLET | Freq: Once | ORAL | Status: AC
  Administered 2023-09-21: 10 mg via ORAL
  Filled 2023-09-21: qty 1

## 2023-09-21 NOTE — Patient Instructions (Signed)
 [facility] - A DEPT OF [facility] [place]  Discharge Instructions: Thank you for choosing [facility] to provide your oncology and hematology care.   If you have a lab appointment with the Cancer Center, please go directly to the Cancer Center and check in at the registration area.   Wear comfortable clothing and clothing appropriate for easy access to any Portacath or PICC line.   We strive to give you quality time with your provider. You may need to reschedule your appointment if you arrive late (15 or more minutes).  Arriving late affects you and other patients whose appointments are after yours.  Also, if you miss three or more appointments without notifying the office, you may be dismissed from the clinic at the provider's discretion.      For prescription refill requests, have your pharmacy contact our office and allow 72 hours for refills to be completed.    Today you received the following chemotherapy and/or immunotherapy agents: Velcade     To help prevent nausea and vomiting after your treatment, we encourage you to take your nausea medication as directed.  BELOW ARE SYMPTOMS THAT SHOULD BE REPORTED IMMEDIATELY: *FEVER GREATER THAN 100.4 F (38 C) OR HIGHER *CHILLS OR SWEATING *NAUSEA AND VOMITING THAT IS NOT CONTROLLED WITH YOUR NAUSEA MEDICATION *UNUSUAL SHORTNESS OF BREATH *UNUSUAL BRUISING OR BLEEDING *URINARY PROBLEMS (pain or burning when urinating, or frequent urination) *BOWEL PROBLEMS (unusual diarrhea, constipation, pain near the anus) TENDERNESS IN MOUTH AND THROAT WITH OR WITHOUT PRESENCE OF ULCERS (sore throat, sores in mouth, or a toothache) UNUSUAL RASH, SWELLING OR PAIN  UNUSUAL VAGINAL DISCHARGE OR ITCHING   Items with * indicate a potential emergency and should be followed up as soon as possible or go to the Emergency Department if any problems should occur.  Please show the CHEMOTHERAPY ALERT CARD or IMMUNOTHERAPY  ALERT CARD at check-in to the Emergency Department and triage nurse.  Should you have questions after your visit or need to cancel or reschedule your appointment, please contact [facility] - A [facility]  Dept: [phone]  and follow the prompts.  Office hours are 8:00 a.m. to 4:30 p.m. Monday - Friday. Please note that voicemails left after 4:00 p.m. may not be returned until the following business day.  We are closed weekends and major holidays. You have access to a nurse at all times for urgent questions. Please call the main number to the clinic Dept: [phone] and follow the prompts.   For any non-urgent questions, you may also contact your provider using MyChart. We now offer e-Visits for anyone 88 and older to request care online for non-urgent symptoms. For details visit [email or web address].   Also download the MyChart app! Go to the app store, search MyChart, open the app, select [place], and log in with your MyChart username and password.

## 2023-09-22 LAB — KAPPA/LAMBDA LIGHT CHAINS
Kappa free light chain: 16.8 mg/L (ref 3.3–19.4)
Kappa, lambda light chain ratio: 1.27 (ref 0.26–1.65)
Lambda free light chains: 13.2 mg/L (ref 5.7–26.3)

## 2023-09-28 LAB — MULTIPLE MYELOMA PANEL, SERUM
Albumin SerPl Elph-Mcnc: 3.6 g/dL (ref 2.9–4.4)
Albumin/Glob SerPl: 1.7 (ref 0.7–1.7)
Alpha 1: 0.2 g/dL (ref 0.0–0.4)
Alpha2 Glob SerPl Elph-Mcnc: 0.7 g/dL (ref 0.4–1.0)
B-Globulin SerPl Elph-Mcnc: 0.9 g/dL (ref 0.7–1.3)
Gamma Glob SerPl Elph-Mcnc: 0.4 g/dL (ref 0.4–1.8)
Globulin, Total: 2.2 g/dL (ref 2.2–3.9)
IgA: 38 mg/dL — ABNORMAL LOW (ref 61–437)
IgG (Immunoglobin G), Serum: 466 mg/dL — ABNORMAL LOW (ref 603–1613)
IgM (Immunoglobulin M), Srm: 43 mg/dL (ref 15–143)
Total Protein ELP: 5.8 g/dL — ABNORMAL LOW (ref 6.0–8.5)

## 2023-09-29 ENCOUNTER — Other Ambulatory Visit: Payer: Self-pay

## 2023-10-01 ENCOUNTER — Other Ambulatory Visit: Payer: Self-pay

## 2023-10-06 ENCOUNTER — Inpatient Hospital Stay: Payer: No Typology Code available for payment source

## 2023-10-06 ENCOUNTER — Inpatient Hospital Stay (HOSPITAL_BASED_OUTPATIENT_CLINIC_OR_DEPARTMENT_OTHER): Payer: No Typology Code available for payment source | Admitting: Physician Assistant

## 2023-10-06 VITALS — BP 133/68 | HR 50 | Temp 98.9°F | Resp 16 | Wt 252.0 lb

## 2023-10-06 DIAGNOSIS — C9000 Multiple myeloma not having achieved remission: Secondary | ICD-10-CM | POA: Diagnosis not present

## 2023-10-06 DIAGNOSIS — Z5112 Encounter for antineoplastic immunotherapy: Secondary | ICD-10-CM | POA: Diagnosis not present

## 2023-10-06 LAB — CBC WITH DIFFERENTIAL (CANCER CENTER ONLY)
Abs Immature Granulocytes: 0.01 10*3/uL (ref 0.00–0.07)
Basophils Absolute: 0 10*3/uL (ref 0.0–0.1)
Basophils Relative: 1 %
Eosinophils Absolute: 0.3 10*3/uL (ref 0.0–0.5)
Eosinophils Relative: 5 %
HCT: 29.8 % — ABNORMAL LOW (ref 39.0–52.0)
Hemoglobin: 9.5 g/dL — ABNORMAL LOW (ref 13.0–17.0)
Immature Granulocytes: 0 %
Lymphocytes Relative: 21 %
Lymphs Abs: 1 10*3/uL (ref 0.7–4.0)
MCH: 32.1 pg (ref 26.0–34.0)
MCHC: 31.9 g/dL (ref 30.0–36.0)
MCV: 100.7 fL — ABNORMAL HIGH (ref 80.0–100.0)
Monocytes Absolute: 0.8 10*3/uL (ref 0.1–1.0)
Monocytes Relative: 15 %
Neutro Abs: 2.9 10*3/uL (ref 1.7–7.7)
Neutrophils Relative %: 58 %
Platelet Count: 189 10*3/uL (ref 150–400)
RBC: 2.96 MIL/uL — ABNORMAL LOW (ref 4.22–5.81)
RDW: 15.8 % — ABNORMAL HIGH (ref 11.5–15.5)
WBC Count: 5 10*3/uL (ref 4.0–10.5)
nRBC: 0 % (ref 0.0–0.2)

## 2023-10-06 LAB — CMP (CANCER CENTER ONLY)
ALT: 18 U/L (ref 0–44)
AST: 21 U/L (ref 15–41)
Albumin: 4.1 g/dL (ref 3.5–5.0)
Alkaline Phosphatase: 49 U/L (ref 38–126)
Anion gap: 8 (ref 5–15)
BUN: 24 mg/dL — ABNORMAL HIGH (ref 8–23)
CO2: 25 mmol/L (ref 22–32)
Calcium: 9.3 mg/dL (ref 8.9–10.3)
Chloride: 109 mmol/L (ref 98–111)
Creatinine: 2.46 mg/dL — ABNORMAL HIGH (ref 0.61–1.24)
GFR, Estimated: 26 mL/min — ABNORMAL LOW (ref >60)
Glucose, Bld: 91 mg/dL (ref 70–99)
Potassium: 4.2 mmol/L (ref 3.5–5.1)
Sodium: 142 mmol/L (ref 135–145)
Total Bilirubin: 0.4 mg/dL (ref 0.0–1.2)
Total Protein: 6.4 g/dL — ABNORMAL LOW (ref 6.5–8.1)

## 2023-10-06 MED ORDER — BORTEZOMIB CHEMO SQ INJECTION 3.5 MG (2.5MG/ML)
1.3000 mg/m2 | Freq: Once | INTRAMUSCULAR | Status: AC
  Administered 2023-10-06: 3 mg via SUBCUTANEOUS
  Filled 2023-10-06: qty 1.2

## 2023-10-06 MED ORDER — PROCHLORPERAZINE MALEATE 10 MG PO TABS
10.0000 mg | ORAL_TABLET | Freq: Once | ORAL | Status: AC
  Administered 2023-10-06: 10 mg via ORAL
  Filled 2023-10-06: qty 1

## 2023-10-06 NOTE — Progress Notes (Signed)
[facility] [phone]   [phone]  PROGRESS NOTE  Patient Care Team: [name], MD as PCP - General (Internal Medicine)  Hematological/Oncological History # IgA Lambda Multiple Myeloma 05/03/2015: last visit with Dr. Arbutus Ped at the [facility]. Was followed for IgA lambda MGUS. 12/11/2021: labs show M protein 3.4, Kappa 19.2, Lambda 1576.4, ratio 0.01. Cr 3.47, Hgb 8.6, WBC 5.7, MCV 99, Plt 221 12/23/2021: establish care with Dr. Leonides Schanz  01/16/2022: Bone marrow biopsy performed, showed a 60% cellular bone marrow predominantly comprised of plasma cells making up 70 to 80%, lambda restricted.  Myeloma FISH panel showed no evidence of abnormalities. 02/07/2022: Cycle 1 Day 1 of CyBorD chemotherapy.  03/09/2022-03/17/2022: hospitalized for fever/pneumonia.  03/24/2022: Cycle 2 Day 1 of CyBorD chemotherapy.  04/22/2022: Cycle 3 Day 1 of CyBorD chemotherapy 05/19/2022-05/26/2022: Delayed start of Cycle 4 Day 1 of CyBorD chemotherapy due to neutropenia.  06/03/2022: Cycle 4 Day 1 of CyBorD chemotherapy. 25% dose reduction of cyclophosphamide due to cytopenias. 06/30/2022: Cycle 4 Day 22. Drop Velcade dose to 1mg /m2 and Cyclophosphamide 200 mg/m2  07/07/2022: Cycle 5 Day 1 of CyBorD chemotherapy with dose reductions.  08/04/2022: Cycle 6 Day 1 of CyBorD chemotherapy with dose reductions.  09/09/2022: transition to maintenance dose Velcade q 2 weeks.   Interval History:  Darren Hinton. 78 y.o. male with medical history significant for IgA lambda multiple myeloma who presents for a follow up visit. The patient's last visit was on 09/07/2023. In the interim since the last visit he has continued on maintenance Velcade.   On exam today Darren Hinton reports he is doing well and tolerating Velcade therapy any new or concerning side effects.  He reports his energy levels are fairly stable.  He ambulates using a wheelchair due to chronic back pain with radiculopathy.   He reports having a good appetite without any significant weight changes.  He denies nausea or vomiting episodes.  He does have occasional episodes of constipation that resolved with over-the-counter stool softeners as needed.  He denies easy bruising or signs of active bleeding.  He does have difficulties with balance secondary to his low back pain with radiculopathy involving both his legs.  He reports stable neuropathy in his legs that is unchanged. He notes he is willing and able to proceed with Velcade at this time.  He denies fevers, chills, sweats, shortness of breath, chest pain or cough. He has no other complaints. Rest of the 10 point ROS is below.   MEDICAL HISTORY:  Past Medical History:  Diagnosis Date   Allergy    Anxiety    Arthritis    Depression    Heart murmur    Hyperlipidemia    Hyperplastic colon polyp    Hyperproteinemia    Hypertension    Hypothyroidism    Kidney disease    Mitral valve prolapse    Multiple myeloma (HCC)    Paroxysmal atrial fibrillation (HCC)    Thyroid disease     SURGICAL HISTORY: Past Surgical History:  Procedure Laterality Date   CARDIOVERSION N/A 03/14/2022   Procedure: CARDIOVERSION;  Surgeon: Chrystie Nose, MD;  Location: [facility] ENDOSCOPY;  Service: Cardiovascular;  Laterality: N/A;   TEE WITHOUT CARDIOVERSION N/A 03/14/2022   Procedure: TRANSESOPHAGEAL ECHOCARDIOGRAM (TEE);  Surgeon: Chrystie Nose, MD;  Location: [facility] ENDOSCOPY;  Service: Cardiovascular;  Laterality: N/A;    SOCIAL HISTORY: Social History   Socioeconomic History   Marital status: Married    Spouse name:  Linda   Number of children: 2   Years of education: Not on file   Highest education level: Not on file  Occupational History   Occupation: retired  Tobacco Use   Smoking status: Former    Current packs/day: 0.00    Types: Cigarettes    Start date: 10/01/1958    Quit date: 10/01/1978    Years since quitting: 45.0   Smokeless tobacco: Never   Tobacco comments:     Former smoker 04/03/22  Vaping Use   Vaping status: Never Used  Substance and Sexual Activity   Alcohol use: Not Currently    Alcohol/week: 2.0 standard drinks of alcohol    Types: 2 Glasses of wine per week    Comment: None in many years   Drug use: No   Sexual activity: Not on file  Other Topics Concern   Not on file  Social History Narrative   Not on file   Social Drivers of Health   Financial Resource Strain: Not on file  Food Insecurity: Not on file  Transportation Needs: Not on file  Physical Activity: Not on file  Stress: Not on file  Social Connections: Not on file  Intimate Partner Violence: Not on file    FAMILY HISTORY: Family History  Problem Relation Age of Onset   Kidney disease Mother    Prostate cancer Father    Kidney disease Brother    Colon cancer Neg Hx    Esophageal cancer Neg Hx    Liver cancer Neg Hx    Pancreatic cancer Neg Hx    Rectal cancer Neg Hx    Stomach cancer Neg Hx     ALLERGIES:  is allergic to zithromax [azithromycin].  MEDICATIONS:  Current Outpatient Medications  Medication Sig Dispense Refill   acyclovir (ZOVIRAX) 400 MG tablet TAKE 1 TABLET BY MOUTH TWICE A DAY 180 tablet 1   allopurinol (ZYLOPRIM) 300 MG tablet TAKE 1 TABLET BY MOUTH EVERY DAY 90 tablet 1   ALPRAZolam (XANAX) 0.25 MG tablet Take 0.25 mg by mouth 2 (two) times daily as needed for anxiety.     amiodarone (PACERONE) 200 MG tablet TAKE 1 TABLET BY MOUTH EVERY DAY 90 tablet 3   amLODipine (NORVASC) 5 MG tablet TAKE 1 TABLET (5 MG TOTAL) BY MOUTH DAILY. 90 tablet 3   dexamethasone (DECADRON) 4 MG tablet TAKE 10 TABLETS (40 MG TOTAL) BY MOUTH ONCE A WEEK IN THE MORNING ON CHEMOTHERAPY DAYS 120 tablet 1   ferrous gluconate (FERGON) 324 MG tablet Take 1 tablet (324 mg total) by mouth 3 (three) times daily with meals. 90 tablet 0   fluticasone (FLONASE) 50 MCG/ACT nasal spray Place 2 sprays into the nose daily as needed for allergies.     gabapentin (NEURONTIN)  300 MG capsule TAKE 1 CAPSULE BY MOUTH TWICE A DAY 60 capsule 1   levothyroxine (SYNTHROID) 100 MCG tablet Take 100 mcg by mouth See admin instructions. Take 1 tablet (100 mcg) by mouth on Mondays, Tuesdays, Wednesdays, Thursdays, Fridays & Saturdays in the morning. Skip a dose on Sundays.     loratadine (CLARITIN) 10 MG tablet Take 10 mg by mouth daily as needed for allergies.     meclizine (ANTIVERT) 25 MG tablet Take 1 tablet (25 mg total) by mouth 3 (three) times daily as needed for dizziness. 30 tablet 0   metoprolol succinate (TOPROL-XL) 25 MG 24 hr tablet Take 1 tablet (25 mg total) by mouth daily. Take with or immediately following a  meal. 30 tablet 3   Olmesartan-amLODIPine-HCTZ 20-5-12.5 MG TABS Take 1 tablet by mouth daily.     prochlorperazine (COMPAZINE) 10 MG tablet Take 1 tablet (10 mg total) by mouth every 6 (six) hours as needed for nausea or vomiting. 30 tablet 0   rosuvastatin (CRESTOR) 20 MG tablet 1 tablet Orally Once a day for 90 days     senna-docusate (SENOKOT-S) 8.6-50 MG tablet Take 1 tablet by mouth at bedtime as needed for mild constipation.     traMADol (ULTRAM) 50 MG tablet Take 1 tablet (50 mg total) by mouth every 6 (six) hours as needed. 30 tablet 0   vitamin B-12 (CYANOCOBALAMIN) 500 MCG tablet Take 500 mcg by mouth in the morning.     apixaban (ELIQUIS) 5 MG TABS tablet Take 1 tablet (5 mg total) by mouth 2 (two) times daily. 180 tablet 1   Current Facility-Administered Medications  Medication Dose Route Frequency Provider Last Rate Last Admin   0.9 %  sodium chloride infusion  500 mL Intravenous Once Armbruster, Willaim Rayas, MD       0.9 %  sodium chloride infusion  500 mL Intravenous Once Armbruster, Willaim Rayas, MD        REVIEW OF SYSTEMS:   Constitutional: ( - ) fevers, ( - )  chills , ( - ) night sweats Eyes: ( - ) blurriness of vision, ( - ) double vision, ( - ) watery eyes Ears, nose, mouth, throat, and face: ( - ) mucositis, ( - ) sore throat Respiratory:  ( - ) cough, ( - ) dyspnea, ( - ) wheezes Cardiovascular: ( - ) palpitation, ( - ) chest discomfort, (+ ) lower extremity swelling Gastrointestinal:  ( - ) nausea, ( - ) heartburn, ( - ) change in bowel habits Skin: ( - ) abnormal skin rashes Lymphatics: ( - ) new lymphadenopathy, ( - ) easy bruising Neurological: ( + ) numbness, ( - ) tingling, ( - ) new weaknesses Behavioral/Psych: ( - ) mood change, ( - ) new changes  All other systems were reviewed with the patient and are negative.  PHYSICAL EXAMINATION: ECOG PERFORMANCE STATUS: 1 - Symptomatic but completely ambulatory  Vitals:   10/06/23 0841  BP: 133/68  Pulse: (!) 50  Resp: 16  Temp: 98.9 F (37.2 C)  SpO2: 100%     Filed Weights   10/06/23 0841  Weight: 252 lb (114.3 kg)    GENERAL: Chronically ill-appearing elderly African-American male, alert, no distress and comfortable SKIN: skin color, texture, turgor are normal, no rashes or significant lesions EYES: conjunctiva are pink and non-injected, sclera clear LUNGS: clear to auscultation and percussion with normal breathing effort HEART: regular rate & rhythm and no murmurs and + 2 bilateral ankle edema Musculoskeletal: no cyanosis of digits and no clubbing  PSYCH: alert & oriented x 3, fluent speech NEURO: no focal motor/sensory deficits  LABORATORY DATA:  I have reviewed the data as listed    Latest Ref Rng & Units 10/06/2023    8:16 AM 09/21/2023    8:51 AM 09/07/2023    8:37 AM  CBC  WBC 4.0 - 10.5 K/uL 5.0  4.9  8.7   Hemoglobin 13.0 - 17.0 g/dL 9.5  9.5  9.3   Hematocrit 39.0 - 52.0 % 29.8  29.7  28.6   Platelets 150 - 400 K/uL 189  279  204        Latest Ref Rng & Units 09/21/2023    8:51  AM 09/07/2023    8:37 AM 08/24/2023    8:32 AM  CMP  Glucose 70 - 99 mg/dL 94  161  99   BUN 8 - 23 mg/dL 23  23  20    Creatinine 0.61 - 1.24 mg/dL 0.96  0.45  4.09   Sodium 135 - 145 mmol/L 140  141  140   Potassium 3.5 - 5.1 mmol/L 4.7  4.2  4.2   Chloride  98 - 111 mmol/L 108  108  108   CO2 22 - 32 mmol/L 25  25  27    Calcium 8.9 - 10.3 mg/dL 9.1  9.2  9.1   Total Protein 6.5 - 8.1 g/dL 6.3  6.5  6.4   Total Bilirubin 0.0 - 1.2 mg/dL 0.4  0.5  0.5   Alkaline Phos 38 - 126 U/L 49  49  53   AST 15 - 41 U/L 21  22  24    ALT 0 - 44 U/L 20  21  20      Lab Results  Component Value Date   MPROTEIN Not Observed 09/21/2023   MPROTEIN Not Observed 09/07/2023   MPROTEIN Not Observed 08/24/2023   Lab Results  Component Value Date   KPAFRELGTCHN 16.8 09/21/2023   KPAFRELGTCHN 16.9 09/07/2023   KPAFRELGTCHN 14.9 08/24/2023   LAMBDASER 13.2 09/21/2023   LAMBDASER 13.8 09/07/2023   LAMBDASER 12.2 08/24/2023   KAPLAMBRATIO 1.27 09/21/2023   KAPLAMBRATIO 1.22 09/07/2023   KAPLAMBRATIO 1.22 08/24/2023    RADIOGRAPHIC STUDIES: No results found.  ASSESSMENT & PLAN Darren Hinton. Is a 78 y.o. male with medical history significant for IgA lambda multiple myeloma who presents for a follow up visit. .    # IgA lambda Multiple Myeloma -- Bone marrow biopsy performed on 01/16/2022 showed increased plasma cells consistent with a plasma cell neoplasm.  Normal FISH panel. --Patient meets diagnostic criteria based on kidney dysfunction and anemia. --Cycle 1 Day 1 of CyBorD chemotherapy started on 02/07/2022 --Velcade maintenance therapy started on 09/09/2022.  Plan: --Most recent SPEP from 09/21/2023 showed M protein not detected and normal serum free light chains --Labs today show white blood cell 5.0,  hemoglobin 9.5, MCV 100.7, platelets 189. Creatinine overall stable at 2.46. Calcium level is normal. LFTs normal. Myeloma panel pending.  --continue on maintenance Velcade every 2 weeks with monthly clinic visits.  # Leg Pain -- Likely a component of worsening neuropathy from his chemotherapy with pre-existing sciatica pain. -- continue gabapentin to 300 mg twice daily -- continue tramadol 50 mg every 6 hours as needed -- patient connected with Dr.  Barbaraann Cao  #Supportive Care -- chemotherapy education complete -- port placement not required.  -- zofran 8mg  q8H PRN and compazine 10mg  PO q6H for nausea -- acyclovir 400mg  PO BID for VCZ prophylaxis --zometa to start after dental clearance.   No orders of the defined types were placed in this encounter.  All questions were answered. The patient knows to call the clinic with any problems, questions or concerns.  I have spent a total of 30 minutes minutes of face-to-face and non-face-to-face time, preparing to see the patient, performing a medically appropriate examination, counseling and educating the patient, documenting clinical information in the electronic health record,  and care coordination.   Georga Kaufmann PA-C Dept of Hematology and Oncology [facility] Phone: [phone]   10/06/2023 8:59 AM

## 2023-10-06 NOTE — Patient Instructions (Signed)
[facility] - A DEPT OF [facility]  Discharge Instructions: Thank you for choosing [facility] to provide your oncology and hematology care.   If you have a lab appointment with the Cancer Center, please go directly to the Cancer Center and check in at the registration area.   Wear comfortable clothing and clothing appropriate for easy access to any Portacath or PICC line.   We strive to give you quality time with your provider. You may need to reschedule your appointment if you arrive late (15 or more minutes).  Arriving late affects you and other patients whose appointments are after yours.  Also, if you miss three or more appointments without notifying the office, you may be dismissed from the clinic at the provider's discretion.      For prescription refill requests, have your pharmacy contact our office and allow 72 hours for refills to be completed.    Today you received the following chemotherapy and/or immunotherapy agents: Velcade.       To help prevent nausea and vomiting after your treatment, we encourage you to take your nausea medication as directed.  BELOW ARE SYMPTOMS THAT SHOULD BE REPORTED IMMEDIATELY: *FEVER GREATER THAN 100.4 F (38 C) OR HIGHER *CHILLS OR SWEATING *NAUSEA AND VOMITING THAT IS NOT CONTROLLED WITH YOUR NAUSEA MEDICATION *UNUSUAL SHORTNESS OF BREATH *UNUSUAL BRUISING OR BLEEDING *URINARY PROBLEMS (pain or burning when urinating, or frequent urination) *BOWEL PROBLEMS (unusual diarrhea, constipation, pain near the anus) TENDERNESS IN MOUTH AND THROAT WITH OR WITHOUT PRESENCE OF ULCERS (sore throat, sores in mouth, or a toothache) UNUSUAL RASH, SWELLING OR PAIN  UNUSUAL VAGINAL DISCHARGE OR ITCHING   Items with * indicate a potential emergency and should be followed up as soon as possible or go to the Emergency Department if any problems should occur.  Please show the CHEMOTHERAPY ALERT CARD or IMMUNOTHERAPY  ALERT CARD at check-in to the Emergency Department and triage nurse.  Should you have questions after your visit or need to cancel or reschedule your appointment, please contact [facility] - A DEPT OF [facility]  Dept: [phone]  and follow the prompts.  Office hours are 8:00 a.m. to 4:30 p.m. Monday - Friday. Please note that voicemails left after 4:00 p.m. may not be returned until the following business day.  We are closed weekends and major holidays. You have access to a nurse at all times for urgent questions. Please call the main number to the clinic Dept: [phone] and follow the prompts.   For any non-urgent questions, you may also contact your provider using MyChart. We now offer e-Visits for anyone 63 and older to request care online for non-urgent symptoms. For details visit [email or web address].   Also download the MyChart app! Go to the app store, search "MyChart", open the app, select [facility], and log in with your MyChart username and password.

## 2023-10-07 LAB — KAPPA/LAMBDA LIGHT CHAINS
Kappa free light chain: 16.3 mg/L (ref 3.3–19.4)
Kappa, lambda light chain ratio: 1.25 (ref 0.26–1.65)
Lambda free light chains: 13 mg/L (ref 5.7–26.3)

## 2023-10-09 LAB — MULTIPLE MYELOMA PANEL, SERUM
Albumin SerPl Elph-Mcnc: 3.6 g/dL (ref 2.9–4.4)
Albumin/Glob SerPl: 1.7 (ref 0.7–1.7)
Alpha 1: 0.2 g/dL (ref 0.0–0.4)
Alpha2 Glob SerPl Elph-Mcnc: 0.6 g/dL (ref 0.4–1.0)
B-Globulin SerPl Elph-Mcnc: 0.9 g/dL (ref 0.7–1.3)
Gamma Glob SerPl Elph-Mcnc: 0.4 g/dL (ref 0.4–1.8)
Globulin, Total: 2.2 g/dL (ref 2.2–3.9)
IgA: 34 mg/dL — ABNORMAL LOW (ref 61–437)
IgG (Immunoglobin G), Serum: 426 mg/dL — ABNORMAL LOW (ref 603–1613)
IgM (Immunoglobulin M), Srm: 40 mg/dL (ref 15–143)
Total Protein ELP: 5.8 g/dL — ABNORMAL LOW (ref 6.0–8.5)

## 2023-10-18 NOTE — Progress Notes (Signed)
 [facility] [phone]   [phone]  PROGRESS NOTE  Patient Care Team: Darren Broad, MD as PCP - General (Internal Medicine)  Hematological/Oncological History # IgA Lambda Multiple Myeloma 05/03/2015: last visit with Dr. Marguerita Hinton at the [facility]. Was followed for IgA lambda MGUS. 12/11/2021: labs show M protein 3.4, Kappa 19.2, Lambda 1576.4, ratio 0.01. Cr 3.47, Hgb 8.6, WBC 5.7, MCV 99, Plt 221 12/23/2021: establish care with Dr. Rosaline Hinton  01/16/2022: Bone marrow biopsy performed, showed a 60% cellular bone marrow predominantly comprised of plasma cells making up 70 to 80%, lambda restricted.  Myeloma FISH panel showed no evidence of abnormalities. 02/07/2022: Cycle 1 Day 1 of CyBorD chemotherapy.  03/09/2022-03/17/2022: hospitalized for fever/pneumonia.  03/24/2022: Cycle 2 Day 1 of CyBorD chemotherapy.  04/22/2022: Cycle 3 Day 1 of CyBorD chemotherapy 05/19/2022-05/26/2022: Delayed start of Cycle 4 Day 1 of CyBorD chemotherapy due to neutropenia.  06/03/2022: Cycle 4 Day 1 of CyBorD chemotherapy. 25% dose reduction of cyclophosphamide  due to cytopenias. 06/30/2022: Cycle 4 Day 22. Drop Velcade  dose to 1mg /m2 and Cyclophosphamide  200 mg/m2  07/07/2022: Cycle 5 Day 1 of CyBorD chemotherapy with dose reductions.  08/04/2022: Cycle 6 Day 1 of CyBorD chemotherapy with dose reductions.  09/09/2022: transition to maintenance dose Velcade  q 2 weeks.   Interval History:  Darren Hinton. 78 y.o. male with medical history significant for IgA lambda multiple myeloma who presents for a follow up visit. The patient's last visit was on 10/06/2023. In the interim since the last visit he has continued on maintenance Velcade .   On exam today Darren Hinton reports he has been well overall interim since her last visit.  He did develop a URI while in contact with a 67-month-old granddaughter.  He notes that he is having some cough and a runny nose and was prescribed  amoxicillin and Mucinex .  He reports he is on the upswing and things are improving.  He thinks that he "cannot really".  He notes that his appetite and energy are both good.  He notes he is had no other complaints.  His weight is down to 246 pounds and he notes he would like to be 230 pounds.  Is tolerating Velcade  shots well with although numbness and tingling in his toes and feet but not in his hands.  His appetite has been good and he is not having any nausea, vomiting, or diarrhea.  Otherwise has had no changes in his health in interim since her last visit.Darren Hinton He notes he is willing and able to proceed with Velcade  at this time.  He denies fevers, chills, sweats, shortness of breath, chest pain or cough. He has no other complaints. Rest of the 10 point ROS is below.   MEDICAL HISTORY:  Past Medical History:  Diagnosis Date   Allergy    Anxiety    Arthritis    Depression    Heart murmur    Hyperlipidemia    Hyperplastic colon polyp    Hyperproteinemia    Hypertension    Hypothyroidism    Kidney disease    Mitral valve prolapse    Multiple myeloma (HCC)    Paroxysmal atrial fibrillation (HCC)    Thyroid disease     SURGICAL HISTORY: Past Surgical History:  Procedure Laterality Date   CARDIOVERSION N/A 03/14/2022   Procedure: CARDIOVERSION;  Surgeon: Darren Lites, MD;  Location: [facility] ENDOSCOPY;  Service: Cardiovascular;  Laterality: N/A;   TEE WITHOUT CARDIOVERSION N/A 03/14/2022   Procedure:  TRANSESOPHAGEAL ECHOCARDIOGRAM (TEE);  Surgeon: Darren Lites, MD;  Location: [facility] ENDOSCOPY;  Service: Cardiovascular;  Laterality: N/A;    SOCIAL HISTORY: Social History   Socioeconomic History   Marital status: Married    Spouse name: Darren Hinton   Number of children: 2   Years of education: Not on file   Highest education level: Not on file  Occupational History   Occupation: retired  Tobacco Use   Smoking status: Former    Current packs/day: 0.00    Types: Cigarettes    Start date:  10/01/1958    Quit date: 10/01/1978    Years since quitting: 45.0   Smokeless tobacco: Never   Tobacco comments:    Former smoker 04/03/22  Vaping Use   Vaping status: Never Used  Substance and Sexual Activity   Alcohol use: Not Currently    Alcohol/week: 2.0 standard drinks of alcohol    Types: 2 Glasses of wine per week    Comment: None in many years   Drug use: No   Sexual activity: Not on file  Other Topics Concern   Not on file  Social History Narrative   Not on file   Social Drivers of Health   Financial Resource Strain: Not on file  Food Insecurity: Not on file  Transportation Needs: Not on file  Physical Activity: Not on file  Stress: Not on file  Social Connections: Not on file  Intimate Partner Violence: Not on file    FAMILY HISTORY: Family History  Problem Relation Age of Onset   Kidney disease Mother    Prostate cancer Father    Kidney disease Brother    Colon cancer Neg Hx    Esophageal cancer Neg Hx    Liver cancer Neg Hx    Pancreatic cancer Neg Hx    Rectal cancer Neg Hx    Stomach cancer Neg Hx     ALLERGIES:  is allergic to zithromax  [azithromycin ].  MEDICATIONS:  Current Outpatient Medications  Medication Sig Dispense Refill   amoxicillin-clavulanate (AUGMENTIN) 875-125 MG tablet Take 1 tablet by mouth 2 (two) times daily.     guaiFENesin  (MUCINEX ) 600 MG 12 hr tablet Take 600 mg by mouth 2 (two) times daily.     acyclovir  (ZOVIRAX ) 400 MG tablet TAKE 1 TABLET BY MOUTH TWICE A DAY 180 tablet 1   allopurinol  (ZYLOPRIM ) 300 MG tablet TAKE 1 TABLET BY MOUTH EVERY DAY 90 tablet 1   ALPRAZolam (XANAX) 0.25 MG tablet Take 0.25 mg by mouth 2 (two) times daily as needed for anxiety.     amiodarone  (PACERONE ) 200 MG tablet TAKE 1 TABLET BY MOUTH EVERY DAY 90 tablet 3   amLODipine  (NORVASC ) 5 MG tablet TAKE 1 TABLET (5 MG TOTAL) BY MOUTH DAILY. 90 tablet 3   apixaban  (ELIQUIS ) 5 MG TABS tablet Take 1 tablet (5 mg total) by mouth 2 (two) times daily.  180 tablet 1   dexamethasone  (DECADRON ) 4 MG tablet TAKE 10 TABLETS (40 MG TOTAL) BY MOUTH ONCE A WEEK IN THE MORNING ON CHEMOTHERAPY DAYS 120 tablet 1   ferrous gluconate  (FERGON) 324 MG tablet Take 1 tablet (324 mg total) by mouth 3 (three) times daily with meals. 90 tablet 0   fluticasone (FLONASE) 50 MCG/ACT nasal spray Place 2 sprays into the nose daily as needed for allergies.     gabapentin  (NEURONTIN ) 300 MG capsule TAKE 1 CAPSULE BY MOUTH TWICE A DAY 60 capsule 1   levothyroxine  (SYNTHROID ) 100 MCG tablet Take 100  mcg by mouth See admin instructions. Take 1 tablet (100 mcg) by mouth on Mondays, Tuesdays, Wednesdays, Thursdays, Fridays & Saturdays in the morning. Skip a dose on Sundays.     loratadine (CLARITIN) 10 MG tablet Take 10 mg by mouth daily as needed for allergies.     meclizine  (ANTIVERT ) 25 MG tablet Take 1 tablet (25 mg total) by mouth 3 (three) times daily as needed for dizziness. 30 tablet 0   metoprolol  succinate (TOPROL -XL) 25 MG 24 hr tablet Take 1 tablet (25 mg total) by mouth daily. Take with or immediately following a meal. 30 tablet 3   Olmesartan-amLODIPine -HCTZ 20-5-12.5 MG TABS Take 1 tablet by mouth daily.     prochlorperazine  (COMPAZINE ) 10 MG tablet Take 1 tablet (10 mg total) by mouth every 6 (six) hours as needed for nausea or vomiting. 30 tablet 0   rosuvastatin (CRESTOR) 20 MG tablet 1 tablet Orally Once a day for 90 days     senna-docusate (SENOKOT-S) 8.6-50 MG tablet Take 1 tablet by mouth at bedtime as needed for mild constipation.     traMADol  (ULTRAM ) 50 MG tablet Take 1 tablet (50 mg total) by mouth every 6 (six) hours as needed. 30 tablet 0   vitamin B-12 (CYANOCOBALAMIN ) 500 MCG tablet Take 500 mcg by mouth in the morning.     Current Facility-Administered Medications  Medication Dose Route Frequency Provider Last Rate Last Admin   0.9 %  sodium chloride  infusion  500 mL Intravenous Once Armbruster, Lendon Queen, MD       0.9 %  sodium chloride  infusion   500 mL Intravenous Once Armbruster, Lendon Queen, MD       Facility-Administered Medications Ordered in Other Visits  Medication Dose Route Frequency Provider Last Rate Last Admin   bortezomib  SQ (VELCADE ) chemo injection (2.5mg /mL concentration) 3 mg  1.3 mg/m2 (Treatment Plan Recorded) Subcutaneous Once Randi Poullard T IV, MD       prochlorperazine  (COMPAZINE ) tablet 10 mg  10 mg Oral Once Ander Bame, MD        REVIEW OF SYSTEMS:   Constitutional: ( - ) fevers, ( - )  chills , ( - ) night sweats Eyes: ( - ) blurriness of vision, ( - ) double vision, ( - ) watery eyes Ears, nose, mouth, throat, and face: ( - ) mucositis, ( - ) sore throat Respiratory: ( - ) cough, ( - ) dyspnea, ( - ) wheezes Cardiovascular: ( - ) palpitation, ( - ) chest discomfort, (+ ) lower extremity swelling Gastrointestinal:  ( - ) nausea, ( - ) heartburn, ( - ) change in bowel habits Skin: ( - ) abnormal skin rashes Lymphatics: ( - ) new lymphadenopathy, ( - ) easy bruising Neurological: ( + ) numbness, ( - ) tingling, ( - ) new weaknesses Behavioral/Psych: ( - ) mood change, ( - ) new changes  All other systems were reviewed with the patient and are negative.  PHYSICAL EXAMINATION: ECOG PERFORMANCE STATUS: 1 - Symptomatic but completely ambulatory  Vitals:   10/19/23 0843  BP: 136/67  Pulse: (!) 54  Resp: 16  Temp: 97.6 F (36.4 C)  SpO2: 100%      Filed Weights   10/19/23 0843  Weight: 246 lb (111.6 kg)     GENERAL: Chronically ill-appearing elderly African-American male, alert, no distress and comfortable SKIN: skin color, texture, turgor are normal, no rashes or significant lesions EYES: conjunctiva are pink and non-injected, sclera clear LUNGS: clear  to auscultation and percussion with normal breathing effort HEART: regular rate & rhythm and no murmurs and + 2 bilateral ankle edema Musculoskeletal: no cyanosis of digits and no clubbing  PSYCH: alert & oriented x 3, fluent speech NEURO:  no focal motor/sensory deficits  LABORATORY DATA:  I have reviewed the data as listed    Latest Ref Rng & Units 10/19/2023    8:23 AM 10/06/2023    8:16 AM 09/21/2023    8:51 AM  CBC  WBC 4.0 - 10.5 K/uL 4.8  5.0  4.9   Hemoglobin 13.0 - 17.0 g/dL 9.9  9.5  9.5   Hematocrit 39.0 - 52.0 % 31.2  29.8  29.7   Platelets 150 - 400 K/uL 234  189  279        Latest Ref Rng & Units 10/19/2023    8:23 AM 10/06/2023    8:16 AM 09/21/2023    8:51 AM  CMP  Glucose 70 - 99 mg/dL 87  91  94   BUN 8 - 23 mg/dL 19  24  23    Creatinine 0.61 - 1.24 mg/dL 7.82  9.56  2.13   Sodium 135 - 145 mmol/L 140  142  140   Potassium 3.5 - 5.1 mmol/L 3.7  4.2  4.7   Chloride 98 - 111 mmol/L 109  109  108   CO2 22 - 32 mmol/L 25  25  25    Calcium 8.9 - 10.3 mg/dL 8.8  9.3  9.1   Total Protein 6.5 - 8.1 g/dL 6.6  6.4  6.3   Total Bilirubin 0.0 - 1.2 mg/dL 0.5  0.4  0.4   Alkaline Phos 38 - 126 U/L 56  49  49   AST 15 - 41 U/L 23  21  21    ALT 0 - 44 U/L 18  18  20      Lab Results  Component Value Date   MPROTEIN Not Observed 10/06/2023   MPROTEIN Not Observed 09/21/2023   MPROTEIN Not Observed 09/07/2023   Lab Results  Component Value Date   KPAFRELGTCHN 16.3 10/06/2023   KPAFRELGTCHN 16.8 09/21/2023   KPAFRELGTCHN 16.9 09/07/2023   LAMBDASER 13.0 10/06/2023   LAMBDASER 13.2 09/21/2023   LAMBDASER 13.8 09/07/2023   KAPLAMBRATIO 1.25 10/06/2023   KAPLAMBRATIO 1.27 09/21/2023   KAPLAMBRATIO 1.22 09/07/2023    RADIOGRAPHIC STUDIES: No results found.  ASSESSMENT & PLAN Darren Hinton. Is a 78 y.o. male with medical history significant for IgA lambda multiple myeloma who presents for a follow up visit. .    # IgA lambda Multiple Myeloma -- Bone marrow biopsy performed on 01/16/2022 showed increased plasma cells consistent with a plasma cell neoplasm.  Normal FISH panel. --Patient meets diagnostic criteria based on kidney dysfunction and anemia. --Cycle 1 Day 1 of CyBorD chemotherapy started  on 02/07/2022 --Velcade  maintenance therapy started on 09/09/2022.  Plan: --Most recent SPEP from 09/21/2023 showed M protein not detected and normal serum free light chains --Labs today show white blood cell 4.8, hemoglobin 9.9, MCV 103.3, platelets 234. Creatinine overall stable at 2.34. Calcium level is normal. LFTs normal. Myeloma panel pending.  --continue on maintenance Velcade  every 2 weeks with monthly clinic visits.  # Leg Pain -- Likely a component of worsening neuropathy from his chemotherapy with pre-existing sciatica pain. -- continue gabapentin  to 300 mg twice daily -- continue tramadol  50 mg every 6 hours as needed -- patient connected with Dr. Mark Sil  #Supportive Care -- chemotherapy  education complete -- port placement not required.  -- zofran  8mg  q8H PRN and compazine  10mg  PO q6H for nausea -- acyclovir  400mg  PO BID for VCZ prophylaxis --zometa to start after dental clearance.   No orders of the defined types were placed in this encounter.  All questions were answered. The patient knows to call the clinic with any problems, questions or concerns.  I have spent a total of 30 minutes minutes of face-to-face and non-face-to-face time, preparing to see the patient, performing a medically appropriate examination, counseling and educating the patient, documenting clinical information in the electronic health record,  and care coordination.   Rogerio Clay, MD Department of Hematology/Oncology [facility] at [facility] Phone: [phone] Pager: [phone] Email: [email or web address]    10/19/2023 9:36 AM

## 2023-10-19 ENCOUNTER — Inpatient Hospital Stay
Payer: No Typology Code available for payment source | Attending: Hematology and Oncology | Admitting: Hematology and Oncology

## 2023-10-19 ENCOUNTER — Inpatient Hospital Stay: Payer: No Typology Code available for payment source

## 2023-10-19 ENCOUNTER — Inpatient Hospital Stay: Payer: No Typology Code available for payment source | Attending: Hematology and Oncology

## 2023-10-19 VITALS — BP 136/67 | HR 54 | Temp 97.6°F | Resp 16 | Wt 246.0 lb

## 2023-10-19 DIAGNOSIS — I341 Nonrheumatic mitral (valve) prolapse: Secondary | ICD-10-CM | POA: Insufficient documentation

## 2023-10-19 DIAGNOSIS — Z5112 Encounter for antineoplastic immunotherapy: Secondary | ICD-10-CM | POA: Diagnosis present

## 2023-10-19 DIAGNOSIS — R059 Cough, unspecified: Secondary | ICD-10-CM | POA: Insufficient documentation

## 2023-10-19 DIAGNOSIS — C9000 Multiple myeloma not having achieved remission: Secondary | ICD-10-CM

## 2023-10-19 DIAGNOSIS — Z87891 Personal history of nicotine dependence: Secondary | ICD-10-CM | POA: Insufficient documentation

## 2023-10-19 DIAGNOSIS — D500 Iron deficiency anemia secondary to blood loss (chronic): Secondary | ICD-10-CM | POA: Diagnosis not present

## 2023-10-19 DIAGNOSIS — Z860102 Personal history of hyperplastic colon polyps: Secondary | ICD-10-CM | POA: Insufficient documentation

## 2023-10-19 DIAGNOSIS — R200 Anesthesia of skin: Secondary | ICD-10-CM | POA: Diagnosis not present

## 2023-10-19 DIAGNOSIS — Z7901 Long term (current) use of anticoagulants: Secondary | ICD-10-CM | POA: Insufficient documentation

## 2023-10-19 DIAGNOSIS — D649 Anemia, unspecified: Secondary | ICD-10-CM | POA: Insufficient documentation

## 2023-10-19 DIAGNOSIS — Z881 Allergy status to other antibiotic agents status: Secondary | ICD-10-CM | POA: Diagnosis not present

## 2023-10-19 DIAGNOSIS — Z79899 Other long term (current) drug therapy: Secondary | ICD-10-CM | POA: Diagnosis not present

## 2023-10-19 DIAGNOSIS — R202 Paresthesia of skin: Secondary | ICD-10-CM | POA: Insufficient documentation

## 2023-10-19 DIAGNOSIS — I480 Paroxysmal atrial fibrillation: Secondary | ICD-10-CM | POA: Diagnosis not present

## 2023-10-19 DIAGNOSIS — Z79624 Long term (current) use of inhibitors of nucleotide synthesis: Secondary | ICD-10-CM | POA: Insufficient documentation

## 2023-10-19 LAB — CMP (CANCER CENTER ONLY)
ALT: 18 U/L (ref 0–44)
AST: 23 U/L (ref 15–41)
Albumin: 4.3 g/dL (ref 3.5–5.0)
Alkaline Phosphatase: 56 U/L (ref 38–126)
Anion gap: 6 (ref 5–15)
BUN: 19 mg/dL (ref 8–23)
CO2: 25 mmol/L (ref 22–32)
Calcium: 8.8 mg/dL — ABNORMAL LOW (ref 8.9–10.3)
Chloride: 109 mmol/L (ref 98–111)
Creatinine: 2.34 mg/dL — ABNORMAL HIGH (ref 0.61–1.24)
GFR, Estimated: 28 mL/min — ABNORMAL LOW (ref >60)
Glucose, Bld: 87 mg/dL (ref 70–99)
Potassium: 3.7 mmol/L (ref 3.5–5.1)
Sodium: 140 mmol/L (ref 135–145)
Total Bilirubin: 0.5 mg/dL (ref 0.0–1.2)
Total Protein: 6.6 g/dL (ref 6.5–8.1)

## 2023-10-19 LAB — CBC WITH DIFFERENTIAL (CANCER CENTER ONLY)
Abs Immature Granulocytes: 0.02 10*3/uL (ref 0.00–0.07)
Basophils Absolute: 0 10*3/uL (ref 0.0–0.1)
Basophils Relative: 1 %
Eosinophils Absolute: 0.2 10*3/uL (ref 0.0–0.5)
Eosinophils Relative: 5 %
HCT: 31.2 % — ABNORMAL LOW (ref 39.0–52.0)
Hemoglobin: 9.9 g/dL — ABNORMAL LOW (ref 13.0–17.0)
Immature Granulocytes: 0 %
Lymphocytes Relative: 27 %
Lymphs Abs: 1.3 10*3/uL (ref 0.7–4.0)
MCH: 32.8 pg (ref 26.0–34.0)
MCHC: 31.7 g/dL (ref 30.0–36.0)
MCV: 103.3 fL — ABNORMAL HIGH (ref 80.0–100.0)
Monocytes Absolute: 0.5 10*3/uL (ref 0.1–1.0)
Monocytes Relative: 11 %
Neutro Abs: 2.7 10*3/uL (ref 1.7–7.7)
Neutrophils Relative %: 56 %
Platelet Count: 234 10*3/uL (ref 150–400)
RBC: 3.02 MIL/uL — ABNORMAL LOW (ref 4.22–5.81)
RDW: 15.1 % (ref 11.5–15.5)
WBC Count: 4.8 10*3/uL (ref 4.0–10.5)
nRBC: 0 % (ref 0.0–0.2)

## 2023-10-19 MED ORDER — PROCHLORPERAZINE MALEATE 10 MG PO TABS
10.0000 mg | ORAL_TABLET | Freq: Once | ORAL | Status: AC
  Administered 2023-10-19: 10 mg via ORAL
  Filled 2023-10-19: qty 1

## 2023-10-19 MED ORDER — BORTEZOMIB CHEMO SQ INJECTION 3.5 MG (2.5MG/ML)
1.3000 mg/m2 | Freq: Once | INTRAMUSCULAR | Status: AC
  Administered 2023-10-19: 3 mg via SUBCUTANEOUS
  Filled 2023-10-19: qty 1.2

## 2023-10-19 NOTE — Patient Instructions (Signed)
 [facility] - A DEPT OF [facility]  Discharge Instructions: Thank you for choosing [facility] to provide your oncology and hematology care.   If you have a lab appointment with the Cancer Center, please go directly to the Cancer Center and check in at the registration area.   Wear comfortable clothing and clothing appropriate for easy access to any Portacath or PICC line.   We strive to give you quality time with your provider. You may need to reschedule your appointment if you arrive late (15 or more minutes).  Arriving late affects you and other patients whose appointments are after yours.  Also, if you miss three or more appointments without notifying the office, you may be dismissed from the clinic at the provider's discretion.      For prescription refill requests, have your pharmacy contact our office and allow 72 hours for refills to be completed.    Today you received the following chemotherapy and/or immunotherapy agent: Bortezomib (Velcade)      To help prevent nausea and vomiting after your treatment, we encourage you to take your nausea medication as directed.  BELOW ARE SYMPTOMS THAT SHOULD BE REPORTED IMMEDIATELY: *FEVER GREATER THAN 100.4 F (38 C) OR HIGHER *CHILLS OR SWEATING *NAUSEA AND VOMITING THAT IS NOT CONTROLLED WITH YOUR NAUSEA MEDICATION *UNUSUAL SHORTNESS OF BREATH *UNUSUAL BRUISING OR BLEEDING *URINARY PROBLEMS (pain or burning when urinating, or frequent urination) *BOWEL PROBLEMS (unusual diarrhea, constipation, pain near the anus) TENDERNESS IN MOUTH AND THROAT WITH OR WITHOUT PRESENCE OF ULCERS (sore throat, sores in mouth, or a toothache) UNUSUAL RASH, SWELLING OR PAIN  UNUSUAL VAGINAL DISCHARGE OR ITCHING   Items with * indicate a potential emergency and should be followed up as soon as possible or go to the Emergency Department if any problems should occur.  Please show the CHEMOTHERAPY ALERT CARD or  IMMUNOTHERAPY ALERT CARD at check-in to the Emergency Department and triage nurse.  Should you have questions after your visit or need to cancel or reschedule your appointment, please contact [facility] - A DE[facility]  Dept: [phone]  and follow the prompts.  Office hours are 8:00 a.m. to 4:30 p.m. Monday - Friday. Please note that voicemails left after 4:00 p.m. may not be returned until the following business day.  We are closed weekends and major holidays. You have access to a nurse at all times for urgent questions. Please call the main number to the clinic Dept: [phone] and follow the prompts.   For any non-urgent questions, you may also contact your provider using MyChart. We now offer e-Visits for anyone 7 and older to request care online for non-urgent symptoms. For details visit [email or web address].   Also download the MyChart app! Go to the app store, search "MyChart", open the app, select [place], and log in with your MyChart username and password.  Bortezomib Injection What is this medication? BORTEZOMIB (bor TEZ oh mib) treats lymphoma. It may also be used to treat multiple myeloma, a type of bone marrow cancer. It works by blocking a protein that causes cancer cells to grow and multiply. This helps to slow or stop the spread of cancer cells. This medicine may be used for other purposes; ask your health care provider or pharmacist if you have questions. COMMON BRAND NAME(S): Velcade What should I tell my care team before I take this medication? They need to  know if you have any of these conditions: Dehydration Diabetes Heart disease Liver disease Tingling of the fingers or toes or other nerve disorder An unusual or allergic reaction to bortezomib, other medications, foods, dyes, or preservatives If you or your partner are pregnant or trying to get pregnant Breastfeeding How should I use this medication? This medication  is injected into a vein or under the skin. It is given by your care team in a hospital or clinic setting. Talk to your care team about the use of this medication in children. Special care may be needed. Overdosage: If you think you have taken too much of this medicine contact a poison control center or emergency room at once. NOTE: This medicine is only for you. Do not share this medicine with others. What if I miss a dose? Keep appointments for follow-up doses. It is important not to miss your dose. Call your care team if you are unable to keep an appointment. What may interact with this medication? Ketoconazole Rifampin This list may not describe all possible interactions. Give your health care provider a list of all the medicines, herbs, non-prescription drugs, or dietary supplements you use. Also tell them if you smoke, drink alcohol, or use illegal drugs. Some items may interact with your medicine. What should I watch for while using this medication? Your condition will be monitored carefully while you are receiving this medication. You may need blood work while taking this medication. This medication may affect your coordination, reaction time, or judgment. Do not drive or operate machinery until you know how this medication affects you. Sit up or stand slowly to reduce the risk of dizzy or fainting spells. Drinking alcohol with this medication can increase the risk of these side effects. This medication may increase your risk of getting an infection. Call your care team for advice if you get a fever, chills, sore throat, or other symptoms of a cold or flu. Do not treat yourself. Try to avoid being around people who are sick. Check with your care team if you have severe diarrhea, nausea, and vomiting, or if you sweat a lot. The loss of too much body fluid may make it dangerous for you to take this medication. Talk to your care team if you may be pregnant. Serious birth defects can occur if you  take this medication during pregnancy and for 7 months after the last dose. You will need a negative pregnancy test before starting this medication. Contraception is recommended while taking this medication and for 7 months after the last dose. Your care team can help you find the option that works for you. If your partner can get pregnant, use a condom during sex while taking this medication and for 4 months after the last dose. Do not breastfeed while taking this medication and for 2 months after the last dose. This medication may cause infertility. Talk to your care team if you are concerned about your fertility. What side effects may I notice from receiving this medication? Side effects that you should report to your care team as soon as possible: Allergic reactions--skin rash, itching, hives, swelling of the face, lips, tongue, or throat Bleeding--bloody or black, tar-like stools, vomiting blood or brown material that looks like coffee grounds, red or dark brown urine, small red or purple spots on skin, unusual bruising or bleeding Bleeding in the brain--severe headache, stiff neck, confusion, dizziness, change in vision, numbness or weakness of the face, arm, or leg, trouble speaking, trouble walking,  vomiting Bowel blockage--stomach cramping, unable to have a bowel movement or pass gas, loss of appetite, vomiting Heart failure--shortness of breath, swelling of the ankles, feet, or hands, sudden weight gain, unusual weakness or fatigue Infection--fever, chills, cough, sore throat, wounds that don't heal, pain or trouble when passing urine, general feeling of discomfort or being unwell Liver injury--right upper belly pain, loss of appetite, nausea, light-colored stool, dark yellow or brown urine, yellowing skin or eyes, unusual weakness or fatigue Low blood pressure--dizziness, feeling faint or lightheaded, blurry vision Lung injury--shortness of breath or trouble breathing, cough, spitting up  blood, chest pain, fever Pain, tingling, or numbness in the hands or feet Severe or prolonged diarrhea Stomach pain, bloody diarrhea, pale skin, unusual weakness or fatigue, decrease in the amount of urine, which may be signs of hemolytic uremic syndrome Sudden and severe headache, confusion, change in vision, seizures, which may be signs of posterior reversible encephalopathy syndrome (PRES) TTP--purple spots on the skin or inside the mouth, pale skin, yellowing skin or eyes, unusual weakness or fatigue, fever, fast or irregular heartbeat, confusion, change in vision, trouble speaking, trouble walking Tumor lysis syndrome (TLS)--nausea, vomiting, diarrhea, decrease in the amount of urine, dark urine, unusual weakness or fatigue, confusion, muscle pain or cramps, fast or irregular heartbeat, joint pain Side effects that usually do not require medical attention (report to your care team if they continue or are bothersome): Constipation Diarrhea Fatigue Loss of appetite Nausea This list may not describe all possible side effects. Call your doctor for medical advice about side effects. You may report side effects to FDA at 1-800-FDA-1088. Where should I keep my medication? This medication is given in a hospital or clinic. It will not be stored at home. NOTE: This sheet is a summary. It may not cover all possible information. If you have questions about this medicine, talk to your doctor, pharmacist, or health care provider.  2024 [employer]/Gold Standard (2022-01-28 00:00:00)

## 2023-10-20 LAB — KAPPA/LAMBDA LIGHT CHAINS
Kappa free light chain: 18.3 mg/L (ref 3.3–19.4)
Kappa, lambda light chain ratio: 1.55 (ref 0.26–1.65)
Lambda free light chains: 11.8 mg/L (ref 5.7–26.3)

## 2023-10-22 LAB — MULTIPLE MYELOMA PANEL, SERUM
Albumin SerPl Elph-Mcnc: 3.8 g/dL (ref 2.9–4.4)
Albumin/Glob SerPl: 1.7 (ref 0.7–1.7)
Alpha 1: 0.3 g/dL (ref 0.0–0.4)
Alpha2 Glob SerPl Elph-Mcnc: 0.7 g/dL (ref 0.4–1.0)
B-Globulin SerPl Elph-Mcnc: 0.9 g/dL (ref 0.7–1.3)
Gamma Glob SerPl Elph-Mcnc: 0.4 g/dL (ref 0.4–1.8)
Globulin, Total: 2.3 g/dL (ref 2.2–3.9)
IgA: 37 mg/dL — ABNORMAL LOW (ref 61–437)
IgG (Immunoglobin G), Serum: 459 mg/dL — ABNORMAL LOW (ref 603–1613)
IgM (Immunoglobulin M), Srm: 44 mg/dL (ref 15–143)
Total Protein ELP: 6.1 g/dL (ref 6.0–8.5)

## 2023-11-01 ENCOUNTER — Other Ambulatory Visit: Payer: Self-pay | Admitting: Physician Assistant

## 2023-11-02 ENCOUNTER — Ambulatory Visit: Payer: Medicare Other | Admitting: Hematology and Oncology

## 2023-11-02 ENCOUNTER — Encounter: Payer: Self-pay | Admitting: Hematology and Oncology

## 2023-11-02 ENCOUNTER — Inpatient Hospital Stay: Payer: No Typology Code available for payment source

## 2023-11-02 VITALS — BP 145/66 | HR 50 | Temp 98.2°F | Resp 18 | Wt 247.1 lb

## 2023-11-02 DIAGNOSIS — C9000 Multiple myeloma not having achieved remission: Secondary | ICD-10-CM

## 2023-11-02 DIAGNOSIS — Z5112 Encounter for antineoplastic immunotherapy: Secondary | ICD-10-CM | POA: Diagnosis not present

## 2023-11-02 LAB — CMP (CANCER CENTER ONLY)
ALT: 16 U/L (ref 0–44)
AST: 22 U/L (ref 15–41)
Albumin: 4.2 g/dL (ref 3.5–5.0)
Alkaline Phosphatase: 49 U/L (ref 38–126)
Anion gap: 5 (ref 5–15)
BUN: 22 mg/dL (ref 8–23)
CO2: 27 mmol/L (ref 22–32)
Calcium: 9.4 mg/dL (ref 8.9–10.3)
Chloride: 110 mmol/L (ref 98–111)
Creatinine: 2.6 mg/dL — ABNORMAL HIGH (ref 0.61–1.24)
GFR, Estimated: 24 mL/min — ABNORMAL LOW (ref >60)
Glucose, Bld: 94 mg/dL (ref 70–99)
Potassium: 4.6 mmol/L (ref 3.5–5.1)
Sodium: 142 mmol/L (ref 135–145)
Total Bilirubin: 0.5 mg/dL (ref 0.0–1.2)
Total Protein: 6.4 g/dL — ABNORMAL LOW (ref 6.5–8.1)

## 2023-11-02 LAB — CBC WITH DIFFERENTIAL (CANCER CENTER ONLY)
Abs Immature Granulocytes: 0.01 10*3/uL (ref 0.00–0.07)
Basophils Absolute: 0 10*3/uL (ref 0.0–0.1)
Basophils Relative: 1 %
Eosinophils Absolute: 0.2 10*3/uL (ref 0.0–0.5)
Eosinophils Relative: 5 %
HCT: 30.7 % — ABNORMAL LOW (ref 39.0–52.0)
Hemoglobin: 9.8 g/dL — ABNORMAL LOW (ref 13.0–17.0)
Immature Granulocytes: 0 %
Lymphocytes Relative: 24 %
Lymphs Abs: 1.1 10*3/uL (ref 0.7–4.0)
MCH: 32.7 pg (ref 26.0–34.0)
MCHC: 31.9 g/dL (ref 30.0–36.0)
MCV: 102.3 fL — ABNORMAL HIGH (ref 80.0–100.0)
Monocytes Absolute: 0.8 10*3/uL (ref 0.1–1.0)
Monocytes Relative: 18 %
Neutro Abs: 2.4 10*3/uL (ref 1.7–7.7)
Neutrophils Relative %: 52 %
Platelet Count: 180 10*3/uL (ref 150–400)
RBC: 3 MIL/uL — ABNORMAL LOW (ref 4.22–5.81)
RDW: 15.6 % — ABNORMAL HIGH (ref 11.5–15.5)
WBC Count: 4.6 10*3/uL (ref 4.0–10.5)
nRBC: 0 % (ref 0.0–0.2)

## 2023-11-02 MED ORDER — PROCHLORPERAZINE MALEATE 10 MG PO TABS
10.0000 mg | ORAL_TABLET | Freq: Once | ORAL | Status: AC
  Administered 2023-11-02: 10 mg via ORAL

## 2023-11-02 MED ORDER — BORTEZOMIB CHEMO SQ INJECTION 3.5 MG (2.5MG/ML)
1.3000 mg/m2 | Freq: Once | INTRAMUSCULAR | Status: AC
Start: 2023-11-02 — End: 2023-11-02
  Administered 2023-11-02: 3 mg via SUBCUTANEOUS
  Filled 2023-11-02: qty 1.2

## 2023-11-17 ENCOUNTER — Inpatient Hospital Stay: Payer: Medicare Other | Attending: Hematology and Oncology | Admitting: Physician Assistant

## 2023-11-17 ENCOUNTER — Inpatient Hospital Stay: Payer: Medicare Other

## 2023-11-17 VITALS — BP 144/71 | HR 55 | Temp 98.1°F | Resp 18 | Wt 248.2 lb

## 2023-11-17 DIAGNOSIS — Z7969 Long term (current) use of other immunomodulators and immunosuppressants: Secondary | ICD-10-CM | POA: Diagnosis not present

## 2023-11-17 DIAGNOSIS — T451X5A Adverse effect of antineoplastic and immunosuppressive drugs, initial encounter: Secondary | ICD-10-CM | POA: Insufficient documentation

## 2023-11-17 DIAGNOSIS — Z881 Allergy status to other antibiotic agents status: Secondary | ICD-10-CM | POA: Insufficient documentation

## 2023-11-17 DIAGNOSIS — Z841 Family history of disorders of kidney and ureter: Secondary | ICD-10-CM | POA: Diagnosis not present

## 2023-11-17 DIAGNOSIS — G620 Drug-induced polyneuropathy: Secondary | ICD-10-CM | POA: Insufficient documentation

## 2023-11-17 DIAGNOSIS — Z79624 Long term (current) use of inhibitors of nucleotide synthesis: Secondary | ICD-10-CM | POA: Insufficient documentation

## 2023-11-17 DIAGNOSIS — Z8042 Family history of malignant neoplasm of prostate: Secondary | ICD-10-CM | POA: Insufficient documentation

## 2023-11-17 DIAGNOSIS — D649 Anemia, unspecified: Secondary | ICD-10-CM | POA: Insufficient documentation

## 2023-11-17 DIAGNOSIS — Z5112 Encounter for antineoplastic immunotherapy: Secondary | ICD-10-CM | POA: Diagnosis present

## 2023-11-17 DIAGNOSIS — Z7901 Long term (current) use of anticoagulants: Secondary | ICD-10-CM | POA: Insufficient documentation

## 2023-11-17 DIAGNOSIS — Z87891 Personal history of nicotine dependence: Secondary | ICD-10-CM | POA: Diagnosis not present

## 2023-11-17 DIAGNOSIS — M79606 Pain in leg, unspecified: Secondary | ICD-10-CM | POA: Insufficient documentation

## 2023-11-17 DIAGNOSIS — C9000 Multiple myeloma not having achieved remission: Secondary | ICD-10-CM

## 2023-11-17 DIAGNOSIS — I480 Paroxysmal atrial fibrillation: Secondary | ICD-10-CM | POA: Diagnosis not present

## 2023-11-17 DIAGNOSIS — Z79899 Other long term (current) drug therapy: Secondary | ICD-10-CM | POA: Insufficient documentation

## 2023-11-17 DIAGNOSIS — Z860102 Personal history of hyperplastic colon polyps: Secondary | ICD-10-CM | POA: Diagnosis not present

## 2023-11-17 LAB — CBC WITH DIFFERENTIAL (CANCER CENTER ONLY)
Abs Immature Granulocytes: 0.11 10*3/uL — ABNORMAL HIGH (ref 0.00–0.07)
Basophils Absolute: 0 10*3/uL (ref 0.0–0.1)
Basophils Relative: 1 %
Eosinophils Absolute: 0.2 10*3/uL (ref 0.0–0.5)
Eosinophils Relative: 4 %
HCT: 32.1 % — ABNORMAL LOW (ref 39.0–52.0)
Hemoglobin: 10.3 g/dL — ABNORMAL LOW (ref 13.0–17.0)
Immature Granulocytes: 2 %
Lymphocytes Relative: 14 %
Lymphs Abs: 0.8 10*3/uL (ref 0.7–4.0)
MCH: 32.4 pg (ref 26.0–34.0)
MCHC: 32.1 g/dL (ref 30.0–36.0)
MCV: 100.9 fL — ABNORMAL HIGH (ref 80.0–100.0)
Monocytes Absolute: 0.4 10*3/uL (ref 0.1–1.0)
Monocytes Relative: 7 %
Neutro Abs: 4.2 10*3/uL (ref 1.7–7.7)
Neutrophils Relative %: 72 %
Platelet Count: 227 10*3/uL (ref 150–400)
RBC: 3.18 MIL/uL — ABNORMAL LOW (ref 4.22–5.81)
RDW: 15.4 % (ref 11.5–15.5)
WBC Count: 5.7 10*3/uL (ref 4.0–10.5)
nRBC: 0 % (ref 0.0–0.2)

## 2023-11-17 LAB — CMP (CANCER CENTER ONLY)
ALT: 25 U/L (ref 0–44)
AST: 27 U/L (ref 15–41)
Albumin: 4.5 g/dL (ref 3.5–5.0)
Alkaline Phosphatase: 59 U/L (ref 38–126)
Anion gap: 6 (ref 5–15)
BUN: 21 mg/dL (ref 8–23)
CO2: 25 mmol/L (ref 22–32)
Calcium: 9 mg/dL (ref 8.9–10.3)
Chloride: 110 mmol/L (ref 98–111)
Creatinine: 2.55 mg/dL — ABNORMAL HIGH (ref 0.61–1.24)
GFR, Estimated: 25 mL/min — ABNORMAL LOW (ref >60)
Glucose, Bld: 98 mg/dL (ref 70–99)
Potassium: 4.3 mmol/L (ref 3.5–5.1)
Sodium: 141 mmol/L (ref 135–145)
Total Bilirubin: 0.5 mg/dL (ref 0.0–1.2)
Total Protein: 6.8 g/dL (ref 6.5–8.1)

## 2023-11-17 MED ORDER — PROCHLORPERAZINE MALEATE 10 MG PO TABS
10.0000 mg | ORAL_TABLET | Freq: Once | ORAL | Status: AC
  Administered 2023-11-17: 10 mg via ORAL
  Filled 2023-11-17: qty 1

## 2023-11-17 MED ORDER — BORTEZOMIB CHEMO SQ INJECTION 3.5 MG (2.5MG/ML)
1.3000 mg/m2 | Freq: Once | INTRAMUSCULAR | Status: AC
  Administered 2023-11-17: 3 mg via SUBCUTANEOUS
  Filled 2023-11-17: qty 1.2

## 2023-11-17 NOTE — Patient Instructions (Signed)
 [facility] - A DEPT OF [facility]  Discharge Instructions: Thank you for choosing [facility] to provide your oncology and hematology care.   If you have a lab appointment with the Cancer Center, please go directly to the Cancer Center and check in at the registration area.   Wear comfortable clothing and clothing appropriate for easy access to any Portacath or PICC line.   We strive to give you quality time with your provider. You may need to reschedule your appointment if you arrive late (15 or more minutes).  Arriving late affects you and other patients whose appointments are after yours.  Also, if you miss three or more appointments without notifying the office, you may be dismissed from the clinic at the provider's discretion.      For prescription refill requests, have your pharmacy contact our office and allow 72 hours for refills to be completed.    Today you received the following chemotherapy and/or immunotherapy agent: Bortezomib (Velcade)      To help prevent nausea and vomiting after your treatment, we encourage you to take your nausea medication as directed.  BELOW ARE SYMPTOMS THAT SHOULD BE REPORTED IMMEDIATELY: *FEVER GREATER THAN 100.4 F (38 C) OR HIGHER *CHILLS OR SWEATING *NAUSEA AND VOMITING THAT IS NOT CONTROLLED WITH YOUR NAUSEA MEDICATION *UNUSUAL SHORTNESS OF BREATH *UNUSUAL BRUISING OR BLEEDING *URINARY PROBLEMS (pain or burning when urinating, or frequent urination) *BOWEL PROBLEMS (unusual diarrhea, constipation, pain near the anus) TENDERNESS IN MOUTH AND THROAT WITH OR WITHOUT PRESENCE OF ULCERS (sore throat, sores in mouth, or a toothache) UNUSUAL RASH, SWELLING OR PAIN  UNUSUAL VAGINAL DISCHARGE OR ITCHING   Items with * indicate a potential emergency and should be followed up as soon as possible or go to the Emergency Department if any problems should occur.  Please show the CHEMOTHERAPY ALERT CARD or  IMMUNOTHERAPY ALERT CARD at check-in to the Emergency Department and triage nurse.  Should you have questions after your visit or need to cancel or reschedule your appointment, please contact [facility] - A DE[facility]  Dept: [phone]  and follow the prompts.  Office hours are 8:00 a.m. to 4:30 p.m. Monday - Friday. Please note that voicemails left after 4:00 p.m. may not be returned until the following business day.  We are closed weekends and major holidays. You have access to a nurse at all times for urgent questions. Please call the main number to the clinic Dept: [phone] and follow the prompts.   For any non-urgent questions, you may also contact your provider using MyChart. We now offer e-Visits for anyone 7 and older to request care online for non-urgent symptoms. For details visit [email or web address].   Also download the MyChart app! Go to the app store, search "MyChart", open the app, select [place], and log in with your MyChart username and password.  Bortezomib Injection What is this medication? BORTEZOMIB (bor TEZ oh mib) treats lymphoma. It may also be used to treat multiple myeloma, a type of bone marrow cancer. It works by blocking a protein that causes cancer cells to grow and multiply. This helps to slow or stop the spread of cancer cells. This medicine may be used for other purposes; ask your health care provider or pharmacist if you have questions. COMMON BRAND NAME(S): Velcade What should I tell my care team before I take this medication? They need to  know if you have any of these conditions: Dehydration Diabetes Heart disease Liver disease Tingling of the fingers or toes or other nerve disorder An unusual or allergic reaction to bortezomib, other medications, foods, dyes, or preservatives If you or your partner are pregnant or trying to get pregnant Breastfeeding How should I use this medication? This medication  is injected into a vein or under the skin. It is given by your care team in a hospital or clinic setting. Talk to your care team about the use of this medication in children. Special care may be needed. Overdosage: If you think you have taken too much of this medicine contact a poison control center or emergency room at once. NOTE: This medicine is only for you. Do not share this medicine with others. What if I miss a dose? Keep appointments for follow-up doses. It is important not to miss your dose. Call your care team if you are unable to keep an appointment. What may interact with this medication? Ketoconazole Rifampin This list may not describe all possible interactions. Give your health care provider a list of all the medicines, herbs, non-prescription drugs, or dietary supplements you use. Also tell them if you smoke, drink alcohol, or use illegal drugs. Some items may interact with your medicine. What should I watch for while using this medication? Your condition will be monitored carefully while you are receiving this medication. You may need blood work while taking this medication. This medication may affect your coordination, reaction time, or judgment. Do not drive or operate machinery until you know how this medication affects you. Sit up or stand slowly to reduce the risk of dizzy or fainting spells. Drinking alcohol with this medication can increase the risk of these side effects. This medication may increase your risk of getting an infection. Call your care team for advice if you get a fever, chills, sore throat, or other symptoms of a cold or flu. Do not treat yourself. Try to avoid being around people who are sick. Check with your care team if you have severe diarrhea, nausea, and vomiting, or if you sweat a lot. The loss of too much body fluid may make it dangerous for you to take this medication. Talk to your care team if you may be pregnant. Serious birth defects can occur if you  take this medication during pregnancy and for 7 months after the last dose. You will need a negative pregnancy test before starting this medication. Contraception is recommended while taking this medication and for 7 months after the last dose. Your care team can help you find the option that works for you. If your partner can get pregnant, use a condom during sex while taking this medication and for 4 months after the last dose. Do not breastfeed while taking this medication and for 2 months after the last dose. This medication may cause infertility. Talk to your care team if you are concerned about your fertility. What side effects may I notice from receiving this medication? Side effects that you should report to your care team as soon as possible: Allergic reactions--skin rash, itching, hives, swelling of the face, lips, tongue, or throat Bleeding--bloody or black, tar-like stools, vomiting blood or brown material that looks like coffee grounds, red or dark brown urine, small red or purple spots on skin, unusual bruising or bleeding Bleeding in the brain--severe headache, stiff neck, confusion, dizziness, change in vision, numbness or weakness of the face, arm, or leg, trouble speaking, trouble walking,  vomiting Bowel blockage--stomach cramping, unable to have a bowel movement or pass gas, loss of appetite, vomiting Heart failure--shortness of breath, swelling of the ankles, feet, or hands, sudden weight gain, unusual weakness or fatigue Infection--fever, chills, cough, sore throat, wounds that don't heal, pain or trouble when passing urine, general feeling of discomfort or being unwell Liver injury--right upper belly pain, loss of appetite, nausea, light-colored stool, dark yellow or brown urine, yellowing skin or eyes, unusual weakness or fatigue Low blood pressure--dizziness, feeling faint or lightheaded, blurry vision Lung injury--shortness of breath or trouble breathing, cough, spitting up  blood, chest pain, fever Pain, tingling, or numbness in the hands or feet Severe or prolonged diarrhea Stomach pain, bloody diarrhea, pale skin, unusual weakness or fatigue, decrease in the amount of urine, which may be signs of hemolytic uremic syndrome Sudden and severe headache, confusion, change in vision, seizures, which may be signs of posterior reversible encephalopathy syndrome (PRES) TTP--purple spots on the skin or inside the mouth, pale skin, yellowing skin or eyes, unusual weakness or fatigue, fever, fast or irregular heartbeat, confusion, change in vision, trouble speaking, trouble walking Tumor lysis syndrome (TLS)--nausea, vomiting, diarrhea, decrease in the amount of urine, dark urine, unusual weakness or fatigue, confusion, muscle pain or cramps, fast or irregular heartbeat, joint pain Side effects that usually do not require medical attention (report to your care team if they continue or are bothersome): Constipation Diarrhea Fatigue Loss of appetite Nausea This list may not describe all possible side effects. Call your doctor for medical advice about side effects. You may report side effects to FDA at 1-800-FDA-1088. Where should I keep my medication? This medication is given in a hospital or clinic. It will not be stored at home. NOTE: This sheet is a summary. It may not cover all possible information. If you have questions about this medicine, talk to your doctor, pharmacist, or health care provider.  2024 [employer]/Gold Standard (2022-01-28 00:00:00)

## 2023-11-17 NOTE — Progress Notes (Signed)
 [facility] [phone]   [phone]  PROGRESS NOTE  Patient Care Team: [name], MD as PCP - General (Internal Medicine)  Hematological/Oncological History # IgA Lambda Multiple Myeloma 05/03/2015: last visit with Dr. Arbutus Ped at the [facility]. Was followed for IgA lambda MGUS. 12/11/2021: labs show M protein 3.4, Kappa 19.2, Lambda 1576.4, ratio 0.01. Cr 3.47, Hgb 8.6, WBC 5.7, MCV 99, Plt 221 12/23/2021: establish care with Dr. Leonides Schanz  01/16/2022: Bone marrow biopsy performed, showed a 60% cellular bone marrow predominantly comprised of plasma cells making up 70 to 80%, lambda restricted.  Myeloma FISH panel showed no evidence of abnormalities. 02/07/2022: Cycle 1 Day 1 of CyBorD chemotherapy.  03/09/2022-03/17/2022: hospitalized for fever/pneumonia.  03/24/2022: Cycle 2 Day 1 of CyBorD chemotherapy.  04/22/2022: Cycle 3 Day 1 of CyBorD chemotherapy 05/19/2022-05/26/2022: Delayed start of Cycle 4 Day 1 of CyBorD chemotherapy due to neutropenia.  06/03/2022: Cycle 4 Day 1 of CyBorD chemotherapy. 25% dose reduction of cyclophosphamide due to cytopenias. 06/30/2022: Cycle 4 Day 22. Drop Velcade dose to 1mg /m2 and Cyclophosphamide 200 mg/m2  07/07/2022: Cycle 5 Day 1 of CyBorD chemotherapy with dose reductions.  08/04/2022: Cycle 6 Day 1 of CyBorD chemotherapy with dose reductions.  09/09/2022: transition to maintenance dose Velcade q 2 weeks.   Interval History:  Darren Hinton. 78 y.o. male with medical history significant for IgA lambda multiple myeloma who presents for a follow up visit. The patient's last visit was on 10/19/2023. In the interim since the last visit he has continued on maintenance Velcade.   On exam today Darren Hinton reports he has been well overall interim since his last visit.  His energy is fairly stable and he is able to complete his baseline ADLs. He does have persistent neuropathy which impacts his ambulation. He uses a Academic librarian wheelchair to ambulate. He denies any recent falls. He has a good appetite and gained 2 lbs since last visit. He denies nausea, vomiting or bowel habit changes. He denies easy bruising or signs of bleeding. He denies any new bone or back pain. Otherwise has had no changes in his health in interim since her last visit.Marland Kitchen He notes he is willing and able to proceed with Velcade at this time.  He denies fevers, chills, sweats, shortness of breath, chest pain or cough. He has no other complaints. Rest of the 10 point ROS is below.   MEDICAL HISTORY:  Past Medical History:  Diagnosis Date   Allergy    Anxiety    Arthritis    Depression    Heart murmur    Hyperlipidemia    Hyperplastic colon polyp    Hyperproteinemia    Hypertension    Hypothyroidism    Kidney disease    Mitral valve prolapse    Multiple myeloma (HCC)    Paroxysmal atrial fibrillation (HCC)    Thyroid disease     SURGICAL HISTORY: Past Surgical History:  Procedure Laterality Date   CARDIOVERSION N/A 03/14/2022   Procedure: CARDIOVERSION;  Surgeon: Chrystie Nose, MD;  Location: [facility] ENDOSCOPY;  Service: Cardiovascular;  Laterality: N/A;   TEE WITHOUT CARDIOVERSION N/A 03/14/2022   Procedure: TRANSESOPHAGEAL ECHOCARDIOGRAM (TEE);  Surgeon: Chrystie Nose, MD;  Location: [facility] ENDOSCOPY;  Service: Cardiovascular;  Laterality: N/A;    SOCIAL HISTORY: Social History   Socioeconomic History   Marital status: Married    Spouse name: Bonita Quin   Number of children: 2   Years of education: Not  on file   Highest education level: Not on file  Occupational History   Occupation: retired  Tobacco Use   Smoking status: Former    Current packs/day: 0.00    Types: Cigarettes    Start date: 10/01/1958    Quit date: 10/01/1978    Years since quitting: 45.1   Smokeless tobacco: Never   Tobacco comments:    Former smoker 04/03/22  Vaping Use   Vaping status: Never Used  Substance and Sexual Activity   Alcohol use: Not  Currently    Alcohol/week: 2.0 standard drinks of alcohol    Types: 2 Glasses of wine per week    Comment: None in many years   Drug use: No   Sexual activity: Not on file  Other Topics Concern   Not on file  Social History Narrative   Not on file   Social Drivers of Health   Financial Resource Strain: Not on file  Food Insecurity: Not on file  Transportation Needs: Not on file  Physical Activity: Not on file  Stress: Not on file  Social Connections: Not on file  Intimate Partner Violence: Not on file    FAMILY HISTORY: Family History  Problem Relation Age of Onset   Kidney disease Mother    Prostate cancer Father    Kidney disease Brother    Colon cancer Neg Hx    Esophageal cancer Neg Hx    Liver cancer Neg Hx    Pancreatic cancer Neg Hx    Rectal cancer Neg Hx    Stomach cancer Neg Hx     ALLERGIES:  is allergic to zithromax [azithromycin].  MEDICATIONS:  Current Outpatient Medications  Medication Sig Dispense Refill   acyclovir (ZOVIRAX) 400 MG tablet TAKE 1 TABLET BY MOUTH TWICE A DAY 180 tablet 1   allopurinol (ZYLOPRIM) 300 MG tablet TAKE 1 TABLET BY MOUTH EVERY DAY 90 tablet 1   ALPRAZolam (XANAX) 0.25 MG tablet Take 0.25 mg by mouth 2 (two) times daily as needed for anxiety.     amiodarone (PACERONE) 200 MG tablet TAKE 1 TABLET BY MOUTH EVERY DAY 90 tablet 3   amLODipine (NORVASC) 5 MG tablet TAKE 1 TABLET (5 MG TOTAL) BY MOUTH DAILY. 90 tablet 3   amoxicillin-clavulanate (AUGMENTIN) 875-125 MG tablet Take 1 tablet by mouth 2 (two) times daily.     dexamethasone (DECADRON) 4 MG tablet TAKE 10 TABLETS (40 MG TOTAL) BY MOUTH ONCE A WEEK IN THE MORNING ON CHEMOTHERAPY DAYS 120 tablet 1   ferrous gluconate (FERGON) 324 MG tablet Take 1 tablet (324 mg total) by mouth 3 (three) times daily with meals. 90 tablet 0   fluticasone (FLONASE) 50 MCG/ACT nasal spray Place 2 sprays into the nose daily as needed for allergies.     gabapentin (NEURONTIN) 300 MG capsule TAKE  1 CAPSULE BY MOUTH TWICE A DAY 60 capsule 1   guaiFENesin (MUCINEX) 600 MG 12 hr tablet Take 600 mg by mouth 2 (two) times daily.     levothyroxine (SYNTHROID) 100 MCG tablet Take 100 mcg by mouth See admin instructions. Take 1 tablet (100 mcg) by mouth on Mondays, Tuesdays, Wednesdays, Thursdays, Fridays & Saturdays in the morning. Skip a dose on Sundays.     loratadine (CLARITIN) 10 MG tablet Take 10 mg by mouth daily as needed for allergies.     meclizine (ANTIVERT) 25 MG tablet Take 1 tablet (25 mg total) by mouth 3 (three) times daily as needed for dizziness. 30 tablet 0  metoprolol succinate (TOPROL-XL) 25 MG 24 hr tablet Take 1 tablet (25 mg total) by mouth daily. Take with or immediately following a meal. 30 tablet 3   Olmesartan-amLODIPine-HCTZ 20-5-12.5 MG TABS Take 1 tablet by mouth daily.     prochlorperazine (COMPAZINE) 10 MG tablet Take 1 tablet (10 mg total) by mouth every 6 (six) hours as needed for nausea or vomiting. 30 tablet 0   rosuvastatin (CRESTOR) 20 MG tablet 1 tablet Orally Once a day for 90 days     senna-docusate (SENOKOT-S) 8.6-50 MG tablet Take 1 tablet by mouth at bedtime as needed for mild constipation.     traMADol (ULTRAM) 50 MG tablet Take 1 tablet (50 mg total) by mouth every 6 (six) hours as needed. 30 tablet 0   vitamin B-12 (CYANOCOBALAMIN) 500 MCG tablet Take 500 mcg by mouth in the morning.     apixaban (ELIQUIS) 5 MG TABS tablet Take 1 tablet (5 mg total) by mouth 2 (two) times daily. 180 tablet 1   Current Facility-Administered Medications  Medication Dose Route Frequency Provider Last Rate Last Admin   0.9 %  sodium chloride infusion  500 mL Intravenous Once Armbruster, Willaim Rayas, MD       0.9 %  sodium chloride infusion  500 mL Intravenous Once Armbruster, Willaim Rayas, MD        REVIEW OF SYSTEMS:   Constitutional: ( - ) fevers, ( - )  chills , ( - ) night sweats Eyes: ( - ) blurriness of vision, ( - ) double vision, ( - ) watery eyes Ears, nose,  mouth, throat, and face: ( - ) mucositis, ( - ) sore throat Respiratory: ( - ) cough, ( - ) dyspnea, ( - ) wheezes Cardiovascular: ( - ) palpitation, ( - ) chest discomfort, (+ ) lower extremity swelling Gastrointestinal:  ( - ) nausea, ( - ) heartburn, ( - ) change in bowel habits Skin: ( - ) abnormal skin rashes Lymphatics: ( - ) new lymphadenopathy, ( - ) easy bruising Neurological: ( + ) numbness, ( - ) tingling, ( - ) new weaknesses Behavioral/Psych: ( - ) mood change, ( - ) new changes  All other systems were reviewed with the patient and are negative.  PHYSICAL EXAMINATION: ECOG PERFORMANCE STATUS: 1 - Symptomatic but completely ambulatory  Vitals:   11/17/23 1118  BP: (!) 144/71  Pulse: (!) 55  Resp: 18  Temp: 98.1 F (36.7 C)  SpO2: 98%    Filed Weights   11/17/23 1118  Weight: 248 lb 3.2 oz (112.6 kg)   GENERAL: Chronically ill-appearing elderly African-American male, alert, no distress and comfortable SKIN: skin color, texture, turgor are normal, no rashes or significant lesions EYES: conjunctiva are pink and non-injected, sclera clear LUNGS: clear to auscultation and percussion with normal breathing effort HEART: regular rate & rhythm and no murmurs and + 2 bilateral ankle edema, wearing compression stockings.  Musculoskeletal: no cyanosis of digits and no clubbing  PSYCH: alert & oriented x 3, fluent speech NEURO: no focal motor/sensory deficits  LABORATORY DATA:  I have reviewed the data as listed    Latest Ref Rng & Units 11/17/2023   10:53 AM 11/02/2023    8:10 AM 10/19/2023    8:23 AM  CBC  WBC 4.0 - 10.5 K/uL 5.7  4.6  4.8   Hemoglobin 13.0 - 17.0 g/dL 40.9  9.8  9.9   Hematocrit 39.0 - 52.0 % 32.1  30.7  31.2  Platelets 150 - 400 K/uL 227  180  234        Latest Ref Rng & Units 11/02/2023    8:10 AM 10/19/2023    8:23 AM 10/06/2023    8:16 AM  CMP  Glucose 70 - 99 mg/dL 94  87  91   BUN 8 - 23 mg/dL 22  19  24    Creatinine 0.61 - 1.24 mg/dL 1.61   0.96  0.45   Sodium 135 - 145 mmol/L 142  140  142   Potassium 3.5 - 5.1 mmol/L 4.6  3.7  4.2   Chloride 98 - 111 mmol/L 110  109  109   CO2 22 - 32 mmol/L 27  25  25    Calcium 8.9 - 10.3 mg/dL 9.4  8.8  9.3   Total Protein 6.5 - 8.1 g/dL 6.4  6.6  6.4   Total Bilirubin 0.0 - 1.2 mg/dL 0.5  0.5  0.4   Alkaline Phos 38 - 126 U/L 49  56  49   AST 15 - 41 U/L 22  23  21    ALT 0 - 44 U/L 16  18  18      Lab Results  Component Value Date   MPROTEIN Not Observed 10/19/2023   MPROTEIN Not Observed 10/06/2023   MPROTEIN Not Observed 09/21/2023   Lab Results  Component Value Date   KPAFRELGTCHN 18.3 10/19/2023   KPAFRELGTCHN 16.3 10/06/2023   KPAFRELGTCHN 16.8 09/21/2023   LAMBDASER 11.8 10/19/2023   LAMBDASER 13.0 10/06/2023   LAMBDASER 13.2 09/21/2023   KAPLAMBRATIO 1.55 10/19/2023   KAPLAMBRATIO 1.25 10/06/2023   KAPLAMBRATIO 1.27 09/21/2023    RADIOGRAPHIC STUDIES: No results found.  ASSESSMENT & PLAN Darren Hinton. Is a 78 y.o. male with medical history significant for IgA lambda multiple myeloma who presents for a follow up visit. .    # IgA lambda Multiple Myeloma -- Bone marrow biopsy performed on 01/16/2022 showed increased plasma cells consistent with a plasma cell neoplasm.  Normal FISH panel. --Patient meets diagnostic criteria based on kidney dysfunction and anemia. --Cycle 1 Day 1 of CyBorD chemotherapy started on 02/07/2022 --Velcade maintenance therapy started on 09/09/2022.  Plan: --Most recent SPEP from 10/19/2023 showed M protein not detected and normal serum free light chains --Labs today show white blood cell 5.7, hemoglobin 10.3, MCV 100.9, platelets 227. Creatinine overall stable at 2.55.Marland Kitchen Calcium level is normal. LFTs normal. Myeloma panel pending.  --continue on maintenance Velcade every 2 weeks with monthly clinic visits.  # Leg Pain -- Likely a component of neuropathy from his chemotherapy with pre-existing sciatica pain. -- continue gabapentin to  300 mg twice daily -- continue tramadol 50 mg every 6 hours as needed -- patient connected with Dr. Barbaraann Cao  #Supportive Care -- chemotherapy education complete -- port placement not required.  -- zofran 8mg  q8H PRN and compazine 10mg  PO q6H for nausea -- acyclovir 400mg  PO BID for VCZ prophylaxis --zometa to start after dental clearance.   No orders of the defined types were placed in this encounter.  All questions were answered. The patient knows to call the clinic with any problems, questions or concerns.  I have spent a total of 30 minutes minutes of face-to-face and non-face-to-face time, preparing to see the patient, performing a medically appropriate examination, counseling and educating the patient, documenting clinical information in the electronic health record,  and care coordination.   Georga Kaufmann PA-C Dept of Hematology and Oncology [facility]  at [facility] Phone: [phone]     11/17/2023 11:28 AM

## 2023-11-18 LAB — KAPPA/LAMBDA LIGHT CHAINS
Kappa free light chain: 14.5 mg/L (ref 3.3–19.4)
Kappa, lambda light chain ratio: 1.09 (ref 0.26–1.65)
Lambda free light chains: 13.3 mg/L (ref 5.7–26.3)

## 2023-11-20 LAB — MULTIPLE MYELOMA PANEL, SERUM
Albumin SerPl Elph-Mcnc: 3.7 g/dL (ref 2.9–4.4)
Albumin/Glob SerPl: 1.5 (ref 0.7–1.7)
Alpha 1: 0.2 g/dL (ref 0.0–0.4)
Alpha2 Glob SerPl Elph-Mcnc: 0.7 g/dL (ref 0.4–1.0)
B-Globulin SerPl Elph-Mcnc: 1.1 g/dL (ref 0.7–1.3)
Gamma Glob SerPl Elph-Mcnc: 0.5 g/dL (ref 0.4–1.8)
Globulin, Total: 2.6 g/dL (ref 2.2–3.9)
IgA: 39 mg/dL — ABNORMAL LOW (ref 61–437)
IgG (Immunoglobin G), Serum: 509 mg/dL — ABNORMAL LOW (ref 603–1613)
IgM (Immunoglobulin M), Srm: 48 mg/dL (ref 15–143)
Total Protein ELP: 6.3 g/dL (ref 6.0–8.5)

## 2023-11-25 ENCOUNTER — Other Ambulatory Visit: Payer: Self-pay

## 2023-11-30 ENCOUNTER — Ambulatory Visit: Payer: Medicare Other | Admitting: Hematology and Oncology

## 2023-11-30 ENCOUNTER — Inpatient Hospital Stay: Payer: Medicare Other

## 2023-11-30 VITALS — BP 151/61 | HR 52 | Temp 98.1°F | Resp 18 | Ht 77.0 in | Wt 243.0 lb

## 2023-11-30 DIAGNOSIS — Z5112 Encounter for antineoplastic immunotherapy: Secondary | ICD-10-CM | POA: Diagnosis not present

## 2023-11-30 DIAGNOSIS — C9000 Multiple myeloma not having achieved remission: Secondary | ICD-10-CM

## 2023-11-30 LAB — CMP (CANCER CENTER ONLY)
ALT: 82 U/L — ABNORMAL HIGH (ref 0–44)
AST: 44 U/L — ABNORMAL HIGH (ref 15–41)
Albumin: 4.3 g/dL (ref 3.5–5.0)
Alkaline Phosphatase: 56 U/L (ref 38–126)
Anion gap: 6 (ref 5–15)
BUN: 32 mg/dL — ABNORMAL HIGH (ref 8–23)
CO2: 23 mmol/L (ref 22–32)
Calcium: 9 mg/dL (ref 8.9–10.3)
Chloride: 110 mmol/L (ref 98–111)
Creatinine: 3.03 mg/dL — ABNORMAL HIGH (ref 0.61–1.24)
GFR, Estimated: 20 mL/min — ABNORMAL LOW (ref >60)
Glucose, Bld: 94 mg/dL (ref 70–99)
Potassium: 5.1 mmol/L (ref 3.5–5.1)
Sodium: 139 mmol/L (ref 135–145)
Total Bilirubin: 0.4 mg/dL (ref 0.0–1.2)
Total Protein: 6.8 g/dL (ref 6.5–8.1)

## 2023-11-30 LAB — CBC WITH DIFFERENTIAL (CANCER CENTER ONLY)
Abs Immature Granulocytes: 0.03 10*3/uL (ref 0.00–0.07)
Basophils Absolute: 0 10*3/uL (ref 0.0–0.1)
Basophils Relative: 1 %
Eosinophils Absolute: 0.1 10*3/uL (ref 0.0–0.5)
Eosinophils Relative: 2 %
HCT: 32.5 % — ABNORMAL LOW (ref 39.0–52.0)
Hemoglobin: 10.2 g/dL — ABNORMAL LOW (ref 13.0–17.0)
Immature Granulocytes: 1 %
Lymphocytes Relative: 19 %
Lymphs Abs: 1 10*3/uL (ref 0.7–4.0)
MCH: 32.7 pg (ref 26.0–34.0)
MCHC: 31.4 g/dL (ref 30.0–36.0)
MCV: 104.2 fL — ABNORMAL HIGH (ref 80.0–100.0)
Monocytes Absolute: 0.4 10*3/uL (ref 0.1–1.0)
Monocytes Relative: 8 %
Neutro Abs: 3.7 10*3/uL (ref 1.7–7.7)
Neutrophils Relative %: 69 %
Platelet Count: 214 10*3/uL (ref 150–400)
RBC: 3.12 MIL/uL — ABNORMAL LOW (ref 4.22–5.81)
RDW: 15.5 % (ref 11.5–15.5)
WBC Count: 5.3 10*3/uL (ref 4.0–10.5)
nRBC: 0 % (ref 0.0–0.2)

## 2023-11-30 MED ORDER — PROCHLORPERAZINE MALEATE 10 MG PO TABS
10.0000 mg | ORAL_TABLET | Freq: Once | ORAL | Status: AC
Start: 2023-11-30 — End: 2023-11-30
  Administered 2023-11-30: 10 mg via ORAL
  Filled 2023-11-30: qty 1

## 2023-11-30 MED ORDER — BORTEZOMIB CHEMO SQ INJECTION 3.5 MG (2.5MG/ML)
1.3000 mg/m2 | Freq: Once | INTRAMUSCULAR | Status: AC
  Administered 2023-11-30: 3 mg via SUBCUTANEOUS
  Filled 2023-11-30: qty 1.2

## 2023-11-30 NOTE — Patient Instructions (Signed)
 [facility] - A DEPT OF [facility]  Discharge Instructions: Thank you for choosing [facility] to provide your oncology and hematology care.   If you have a lab appointment with the Cancer Center, please go directly to the Cancer Center and check in at the registration area.   Wear comfortable clothing and clothing appropriate for easy access to any Portacath or PICC line.   We strive to give you quality time with your provider. You may need to reschedule your appointment if you arrive late (15 or more minutes).  Arriving late affects you and other patients whose appointments are after yours.  Also, if you miss three or more appointments without notifying the office, you may be dismissed from the clinic at the provider's discretion.      For prescription refill requests, have your pharmacy contact our office and allow 72 hours for refills to be completed.    Today you received the following chemotherapy and/or immunotherapy agent: Bortezomib (Velcade)      To help prevent nausea and vomiting after your treatment, we encourage you to take your nausea medication as directed.  BELOW ARE SYMPTOMS THAT SHOULD BE REPORTED IMMEDIATELY: *FEVER GREATER THAN 100.4 F (38 C) OR HIGHER *CHILLS OR SWEATING *NAUSEA AND VOMITING THAT IS NOT CONTROLLED WITH YOUR NAUSEA MEDICATION *UNUSUAL SHORTNESS OF BREATH *UNUSUAL BRUISING OR BLEEDING *URINARY PROBLEMS (pain or burning when urinating, or frequent urination) *BOWEL PROBLEMS (unusual diarrhea, constipation, pain near the anus) TENDERNESS IN MOUTH AND THROAT WITH OR WITHOUT PRESENCE OF ULCERS (sore throat, sores in mouth, or a toothache) UNUSUAL RASH, SWELLING OR PAIN  UNUSUAL VAGINAL DISCHARGE OR ITCHING   Items with * indicate a potential emergency and should be followed up as soon as possible or go to the Emergency Department if any problems should occur.  Please show the CHEMOTHERAPY ALERT CARD or  IMMUNOTHERAPY ALERT CARD at check-in to the Emergency Department and triage nurse.  Should you have questions after your visit or need to cancel or reschedule your appointment, please contact [facility] - A DE[facility]  Dept: [phone]  and follow the prompts.  Office hours are 8:00 a.m. to 4:30 p.m. Monday - Friday. Please note that voicemails left after 4:00 p.m. may not be returned until the following business day.  We are closed weekends and major holidays. You have access to a nurse at all times for urgent questions. Please call the main number to the clinic Dept: [phone] and follow the prompts.   For any non-urgent questions, you may also contact your provider using MyChart. We now offer e-Visits for anyone 7 and older to request care online for non-urgent symptoms. For details visit [email or web address].   Also download the MyChart app! Go to the app store, search "MyChart", open the app, select [place], and log in with your MyChart username and password.  Bortezomib Injection What is this medication? BORTEZOMIB (bor TEZ oh mib) treats lymphoma. It may also be used to treat multiple myeloma, a type of bone marrow cancer. It works by blocking a protein that causes cancer cells to grow and multiply. This helps to slow or stop the spread of cancer cells. This medicine may be used for other purposes; ask your health care provider or pharmacist if you have questions. COMMON BRAND NAME(S): Velcade What should I tell my care team before I take this medication? They need to  know if you have any of these conditions: Dehydration Diabetes Heart disease Liver disease Tingling of the fingers or toes or other nerve disorder An unusual or allergic reaction to bortezomib, other medications, foods, dyes, or preservatives If you or your partner are pregnant or trying to get pregnant Breastfeeding How should I use this medication? This medication  is injected into a vein or under the skin. It is given by your care team in a hospital or clinic setting. Talk to your care team about the use of this medication in children. Special care may be needed. Overdosage: If you think you have taken too much of this medicine contact a poison control center or emergency room at once. NOTE: This medicine is only for you. Do not share this medicine with others. What if I miss a dose? Keep appointments for follow-up doses. It is important not to miss your dose. Call your care team if you are unable to keep an appointment. What may interact with this medication? Ketoconazole Rifampin This list may not describe all possible interactions. Give your health care provider a list of all the medicines, herbs, non-prescription drugs, or dietary supplements you use. Also tell them if you smoke, drink alcohol, or use illegal drugs. Some items may interact with your medicine. What should I watch for while using this medication? Your condition will be monitored carefully while you are receiving this medication. You may need blood work while taking this medication. This medication may affect your coordination, reaction time, or judgment. Do not drive or operate machinery until you know how this medication affects you. Sit up or stand slowly to reduce the risk of dizzy or fainting spells. Drinking alcohol with this medication can increase the risk of these side effects. This medication may increase your risk of getting an infection. Call your care team for advice if you get a fever, chills, sore throat, or other symptoms of a cold or flu. Do not treat yourself. Try to avoid being around people who are sick. Check with your care team if you have severe diarrhea, nausea, and vomiting, or if you sweat a lot. The loss of too much body fluid may make it dangerous for you to take this medication. Talk to your care team if you may be pregnant. Serious birth defects can occur if you  take this medication during pregnancy and for 7 months after the last dose. You will need a negative pregnancy test before starting this medication. Contraception is recommended while taking this medication and for 7 months after the last dose. Your care team can help you find the option that works for you. If your partner can get pregnant, use a condom during sex while taking this medication and for 4 months after the last dose. Do not breastfeed while taking this medication and for 2 months after the last dose. This medication may cause infertility. Talk to your care team if you are concerned about your fertility. What side effects may I notice from receiving this medication? Side effects that you should report to your care team as soon as possible: Allergic reactions--skin rash, itching, hives, swelling of the face, lips, tongue, or throat Bleeding--bloody or black, tar-like stools, vomiting blood or brown material that looks like coffee grounds, red or dark brown urine, small red or purple spots on skin, unusual bruising or bleeding Bleeding in the brain--severe headache, stiff neck, confusion, dizziness, change in vision, numbness or weakness of the face, arm, or leg, trouble speaking, trouble walking,  vomiting Bowel blockage--stomach cramping, unable to have a bowel movement or pass gas, loss of appetite, vomiting Heart failure--shortness of breath, swelling of the ankles, feet, or hands, sudden weight gain, unusual weakness or fatigue Infection--fever, chills, cough, sore throat, wounds that don't heal, pain or trouble when passing urine, general feeling of discomfort or being unwell Liver injury--right upper belly pain, loss of appetite, nausea, light-colored stool, dark yellow or brown urine, yellowing skin or eyes, unusual weakness or fatigue Low blood pressure--dizziness, feeling faint or lightheaded, blurry vision Lung injury--shortness of breath or trouble breathing, cough, spitting up  blood, chest pain, fever Pain, tingling, or numbness in the hands or feet Severe or prolonged diarrhea Stomach pain, bloody diarrhea, pale skin, unusual weakness or fatigue, decrease in the amount of urine, which may be signs of hemolytic uremic syndrome Sudden and severe headache, confusion, change in vision, seizures, which may be signs of posterior reversible encephalopathy syndrome (PRES) TTP--purple spots on the skin or inside the mouth, pale skin, yellowing skin or eyes, unusual weakness or fatigue, fever, fast or irregular heartbeat, confusion, change in vision, trouble speaking, trouble walking Tumor lysis syndrome (TLS)--nausea, vomiting, diarrhea, decrease in the amount of urine, dark urine, unusual weakness or fatigue, confusion, muscle pain or cramps, fast or irregular heartbeat, joint pain Side effects that usually do not require medical attention (report to your care team if they continue or are bothersome): Constipation Diarrhea Fatigue Loss of appetite Nausea This list may not describe all possible side effects. Call your doctor for medical advice about side effects. You may report side effects to FDA at 1-800-FDA-1088. Where should I keep my medication? This medication is given in a hospital or clinic. It will not be stored at home. NOTE: This sheet is a summary. It may not cover all possible information. If you have questions about this medicine, talk to your doctor, pharmacist, or health care provider.  2024 [employer]/Gold Standard (2022-01-28 00:00:00)

## 2023-11-30 NOTE — Progress Notes (Signed)
 Per Leonides Schanz, MD, okay to treat with Creatinine 3.03 and ALT 82

## 2023-12-14 ENCOUNTER — Inpatient Hospital Stay: Payer: Medicare Other | Attending: Hematology and Oncology | Admitting: Hematology and Oncology

## 2023-12-14 ENCOUNTER — Inpatient Hospital Stay: Payer: Medicare Other | Attending: Hematology and Oncology

## 2023-12-14 ENCOUNTER — Other Ambulatory Visit: Payer: Self-pay

## 2023-12-14 ENCOUNTER — Inpatient Hospital Stay: Payer: Medicare Other

## 2023-12-14 VITALS — BP 133/66 | HR 51 | Temp 98.3°F | Resp 13 | Wt 242.8 lb

## 2023-12-14 DIAGNOSIS — Z5112 Encounter for antineoplastic immunotherapy: Secondary | ICD-10-CM | POA: Insufficient documentation

## 2023-12-14 DIAGNOSIS — C9000 Multiple myeloma not having achieved remission: Secondary | ICD-10-CM

## 2023-12-14 DIAGNOSIS — Z87891 Personal history of nicotine dependence: Secondary | ICD-10-CM | POA: Insufficient documentation

## 2023-12-14 DIAGNOSIS — D649 Anemia, unspecified: Secondary | ICD-10-CM | POA: Insufficient documentation

## 2023-12-14 DIAGNOSIS — D631 Anemia in chronic kidney disease: Secondary | ICD-10-CM

## 2023-12-14 DIAGNOSIS — G629 Polyneuropathy, unspecified: Secondary | ICD-10-CM | POA: Diagnosis not present

## 2023-12-14 DIAGNOSIS — I480 Paroxysmal atrial fibrillation: Secondary | ICD-10-CM | POA: Diagnosis not present

## 2023-12-14 DIAGNOSIS — Z881 Allergy status to other antibiotic agents status: Secondary | ICD-10-CM | POA: Diagnosis not present

## 2023-12-14 DIAGNOSIS — Z79620 Long term (current) use of immunosuppressive biologic: Secondary | ICD-10-CM | POA: Insufficient documentation

## 2023-12-14 DIAGNOSIS — Z7901 Long term (current) use of anticoagulants: Secondary | ICD-10-CM | POA: Insufficient documentation

## 2023-12-14 DIAGNOSIS — Z79899 Other long term (current) drug therapy: Secondary | ICD-10-CM | POA: Diagnosis not present

## 2023-12-14 DIAGNOSIS — Z860102 Personal history of hyperplastic colon polyps: Secondary | ICD-10-CM | POA: Diagnosis not present

## 2023-12-14 DIAGNOSIS — N184 Chronic kidney disease, stage 4 (severe): Secondary | ICD-10-CM | POA: Diagnosis not present

## 2023-12-14 LAB — CBC WITH DIFFERENTIAL (CANCER CENTER ONLY)
Abs Immature Granulocytes: 0.02 10*3/uL (ref 0.00–0.07)
Basophils Absolute: 0 10*3/uL (ref 0.0–0.1)
Basophils Relative: 1 %
Eosinophils Absolute: 0.2 10*3/uL (ref 0.0–0.5)
Eosinophils Relative: 5 %
HCT: 30 % — ABNORMAL LOW (ref 39.0–52.0)
Hemoglobin: 9.4 g/dL — ABNORMAL LOW (ref 13.0–17.0)
Immature Granulocytes: 0 %
Lymphocytes Relative: 25 %
Lymphs Abs: 1.2 10*3/uL (ref 0.7–4.0)
MCH: 32.3 pg (ref 26.0–34.0)
MCHC: 31.3 g/dL (ref 30.0–36.0)
MCV: 103.1 fL — ABNORMAL HIGH (ref 80.0–100.0)
Monocytes Absolute: 0.6 10*3/uL (ref 0.1–1.0)
Monocytes Relative: 14 %
Neutro Abs: 2.7 10*3/uL (ref 1.7–7.7)
Neutrophils Relative %: 55 %
Platelet Count: 191 10*3/uL (ref 150–400)
RBC: 2.91 MIL/uL — ABNORMAL LOW (ref 4.22–5.81)
RDW: 16.1 % — ABNORMAL HIGH (ref 11.5–15.5)
WBC Count: 4.8 10*3/uL (ref 4.0–10.5)
nRBC: 0 % (ref 0.0–0.2)

## 2023-12-14 LAB — CMP (CANCER CENTER ONLY)
ALT: 27 U/L (ref 0–44)
AST: 24 U/L (ref 15–41)
Albumin: 4.2 g/dL (ref 3.5–5.0)
Alkaline Phosphatase: 49 U/L (ref 38–126)
Anion gap: 7 (ref 5–15)
BUN: 26 mg/dL — ABNORMAL HIGH (ref 8–23)
CO2: 25 mmol/L (ref 22–32)
Calcium: 9.1 mg/dL (ref 8.9–10.3)
Chloride: 110 mmol/L (ref 98–111)
Creatinine: 2.94 mg/dL — ABNORMAL HIGH (ref 0.61–1.24)
GFR, Estimated: 21 mL/min — ABNORMAL LOW (ref >60)
Glucose, Bld: 101 mg/dL — ABNORMAL HIGH (ref 70–99)
Potassium: 4.2 mmol/L (ref 3.5–5.1)
Sodium: 142 mmol/L (ref 135–145)
Total Bilirubin: 0.4 mg/dL (ref 0.0–1.2)
Total Protein: 6.5 g/dL (ref 6.5–8.1)

## 2023-12-14 MED ORDER — PROCHLORPERAZINE MALEATE 10 MG PO TABS
10.0000 mg | ORAL_TABLET | Freq: Once | ORAL | Status: AC
  Administered 2023-12-14: 10 mg via ORAL
  Filled 2023-12-14: qty 1

## 2023-12-14 MED ORDER — BORTEZOMIB CHEMO SQ INJECTION 3.5 MG (2.5MG/ML)
1.3000 mg/m2 | Freq: Once | INTRAMUSCULAR | Status: AC
Start: 2023-12-14 — End: 2023-12-14
  Administered 2023-12-14: 3 mg via SUBCUTANEOUS
  Filled 2023-12-14: qty 1.2

## 2023-12-14 NOTE — Patient Instructions (Signed)
 [facility] - A DEPT OF [facility]  Discharge Instructions: Thank you for choosing [facility] to provide your oncology and hematology care.   If you have a lab appointment with the Cancer Center, please go directly to the Cancer Center and check in at the registration area.   Wear comfortable clothing and clothing appropriate for easy access to any Portacath or PICC line.   We strive to give you quality time with your provider. You may need to reschedule your appointment if you arrive late (15 or more minutes).  Arriving late affects you and other patients whose appointments are after yours.  Also, if you miss three or more appointments without notifying the office, you may be dismissed from the clinic at the provider's discretion.      For prescription refill requests, have your pharmacy contact our office and allow 72 hours for refills to be completed.    Today you received the following chemotherapy and/or immunotherapy agent: Bortezomib (Velcade)      To help prevent nausea and vomiting after your treatment, we encourage you to take your nausea medication as directed.  BELOW ARE SYMPTOMS THAT SHOULD BE REPORTED IMMEDIATELY: *FEVER GREATER THAN 100.4 F (38 C) OR HIGHER *CHILLS OR SWEATING *NAUSEA AND VOMITING THAT IS NOT CONTROLLED WITH YOUR NAUSEA MEDICATION *UNUSUAL SHORTNESS OF BREATH *UNUSUAL BRUISING OR BLEEDING *URINARY PROBLEMS (pain or burning when urinating, or frequent urination) *BOWEL PROBLEMS (unusual diarrhea, constipation, pain near the anus) TENDERNESS IN MOUTH AND THROAT WITH OR WITHOUT PRESENCE OF ULCERS (sore throat, sores in mouth, or a toothache) UNUSUAL RASH, SWELLING OR PAIN  UNUSUAL VAGINAL DISCHARGE OR ITCHING   Items with * indicate a potential emergency and should be followed up as soon as possible or go to the Emergency Department if any problems should occur.  Please show the CHEMOTHERAPY ALERT CARD or  IMMUNOTHERAPY ALERT CARD at check-in to the Emergency Department and triage nurse.  Should you have questions after your visit or need to cancel or reschedule your appointment, please contact [facility] - A DE[facility]  Dept: [phone]  and follow the prompts.  Office hours are 8:00 a.m. to 4:30 p.m. Monday - Friday. Please note that voicemails left after 4:00 p.m. may not be returned until the following business day.  We are closed weekends and major holidays. You have access to a nurse at all times for urgent questions. Please call the main number to the clinic Dept: [phone] and follow the prompts.   For any non-urgent questions, you may also contact your provider using MyChart. We now offer e-Visits for anyone 7 and older to request care online for non-urgent symptoms. For details visit [email or web address].   Also download the MyChart app! Go to the app store, search "MyChart", open the app, select [place], and log in with your MyChart username and password.  Bortezomib Injection What is this medication? BORTEZOMIB (bor TEZ oh mib) treats lymphoma. It may also be used to treat multiple myeloma, a type of bone marrow cancer. It works by blocking a protein that causes cancer cells to grow and multiply. This helps to slow or stop the spread of cancer cells. This medicine may be used for other purposes; ask your health care provider or pharmacist if you have questions. COMMON BRAND NAME(S): Velcade What should I tell my care team before I take this medication? They need to  know if you have any of these conditions: Dehydration Diabetes Heart disease Liver disease Tingling of the fingers or toes or other nerve disorder An unusual or allergic reaction to bortezomib, other medications, foods, dyes, or preservatives If you or your partner are pregnant or trying to get pregnant Breastfeeding How should I use this medication? This medication  is injected into a vein or under the skin. It is given by your care team in a hospital or clinic setting. Talk to your care team about the use of this medication in children. Special care may be needed. Overdosage: If you think you have taken too much of this medicine contact a poison control center or emergency room at once. NOTE: This medicine is only for you. Do not share this medicine with others. What if I miss a dose? Keep appointments for follow-up doses. It is important not to miss your dose. Call your care team if you are unable to keep an appointment. What may interact with this medication? Ketoconazole Rifampin This list may not describe all possible interactions. Give your health care provider a list of all the medicines, herbs, non-prescription drugs, or dietary supplements you use. Also tell them if you smoke, drink alcohol, or use illegal drugs. Some items may interact with your medicine. What should I watch for while using this medication? Your condition will be monitored carefully while you are receiving this medication. You may need blood work while taking this medication. This medication may affect your coordination, reaction time, or judgment. Do not drive or operate machinery until you know how this medication affects you. Sit up or stand slowly to reduce the risk of dizzy or fainting spells. Drinking alcohol with this medication can increase the risk of these side effects. This medication may increase your risk of getting an infection. Call your care team for advice if you get a fever, chills, sore throat, or other symptoms of a cold or flu. Do not treat yourself. Try to avoid being around people who are sick. Check with your care team if you have severe diarrhea, nausea, and vomiting, or if you sweat a lot. The loss of too much body fluid may make it dangerous for you to take this medication. Talk to your care team if you may be pregnant. Serious birth defects can occur if you  take this medication during pregnancy and for 7 months after the last dose. You will need a negative pregnancy test before starting this medication. Contraception is recommended while taking this medication and for 7 months after the last dose. Your care team can help you find the option that works for you. If your partner can get pregnant, use a condom during sex while taking this medication and for 4 months after the last dose. Do not breastfeed while taking this medication and for 2 months after the last dose. This medication may cause infertility. Talk to your care team if you are concerned about your fertility. What side effects may I notice from receiving this medication? Side effects that you should report to your care team as soon as possible: Allergic reactions--skin rash, itching, hives, swelling of the face, lips, tongue, or throat Bleeding--bloody or black, tar-like stools, vomiting blood or brown material that looks like coffee grounds, red or dark brown urine, small red or purple spots on skin, unusual bruising or bleeding Bleeding in the brain--severe headache, stiff neck, confusion, dizziness, change in vision, numbness or weakness of the face, arm, or leg, trouble speaking, trouble walking,  vomiting Bowel blockage--stomach cramping, unable to have a bowel movement or pass gas, loss of appetite, vomiting Heart failure--shortness of breath, swelling of the ankles, feet, or hands, sudden weight gain, unusual weakness or fatigue Infection--fever, chills, cough, sore throat, wounds that don't heal, pain or trouble when passing urine, general feeling of discomfort or being unwell Liver injury--right upper belly pain, loss of appetite, nausea, light-colored stool, dark yellow or brown urine, yellowing skin or eyes, unusual weakness or fatigue Low blood pressure--dizziness, feeling faint or lightheaded, blurry vision Lung injury--shortness of breath or trouble breathing, cough, spitting up  blood, chest pain, fever Pain, tingling, or numbness in the hands or feet Severe or prolonged diarrhea Stomach pain, bloody diarrhea, pale skin, unusual weakness or fatigue, decrease in the amount of urine, which may be signs of hemolytic uremic syndrome Sudden and severe headache, confusion, change in vision, seizures, which may be signs of posterior reversible encephalopathy syndrome (PRES) TTP--purple spots on the skin or inside the mouth, pale skin, yellowing skin or eyes, unusual weakness or fatigue, fever, fast or irregular heartbeat, confusion, change in vision, trouble speaking, trouble walking Tumor lysis syndrome (TLS)--nausea, vomiting, diarrhea, decrease in the amount of urine, dark urine, unusual weakness or fatigue, confusion, muscle pain or cramps, fast or irregular heartbeat, joint pain Side effects that usually do not require medical attention (report to your care team if they continue or are bothersome): Constipation Diarrhea Fatigue Loss of appetite Nausea This list may not describe all possible side effects. Call your doctor for medical advice about side effects. You may report side effects to FDA at 1-800-FDA-1088. Where should I keep my medication? This medication is given in a hospital or clinic. It will not be stored at home. NOTE: This sheet is a summary. It may not cover all possible information. If you have questions about this medicine, talk to your doctor, pharmacist, or health care provider.  2024 [employer]/Gold Standard (2022-01-28 00:00:00)

## 2023-12-14 NOTE — Progress Notes (Unsigned)
 [facility] [phone]   [phone]  PROGRESS NOTE  Patient Care Team: [name], MD as PCP - General (Internal Medicine)  Hematological/Oncological History # IgA Lambda Multiple Myeloma 05/03/2015: last visit with Dr. Arbutus Ped at the [facility]. Was followed for IgA lambda MGUS. 12/11/2021: labs show M protein 3.4, Kappa 19.2, Lambda 1576.4, ratio 0.01. Cr 3.47, Hgb 8.6, WBC 5.7, MCV 99, Plt 221 12/23/2021: establish care with Dr. Leonides Schanz  01/16/2022: Bone marrow biopsy performed, showed a 60% cellular bone marrow predominantly comprised of plasma cells making up 70 to 80%, lambda restricted.  Myeloma FISH panel showed no evidence of abnormalities. 02/07/2022: Cycle 1 Day 1 of CyBorD chemotherapy.  03/09/2022-03/17/2022: hospitalized for fever/pneumonia.  03/24/2022: Cycle 2 Day 1 of CyBorD chemotherapy.  04/22/2022: Cycle 3 Day 1 of CyBorD chemotherapy 05/19/2022-05/26/2022: Delayed start of Cycle 4 Day 1 of CyBorD chemotherapy due to neutropenia.  06/03/2022: Cycle 4 Day 1 of CyBorD chemotherapy. 25% dose reduction of cyclophosphamide due to cytopenias. 06/30/2022: Cycle 4 Day 22. Drop Velcade dose to 1mg /m2 and Cyclophosphamide 200 mg/m2  07/07/2022: Cycle 5 Day 1 of CyBorD chemotherapy with dose reductions.  08/04/2022: Cycle 6 Day 1 of CyBorD chemotherapy with dose reductions.  09/09/2022: transition to maintenance dose Velcade q 2 weeks.   Interval History:  Darren Hinton. 78 y.o. male with medical history significant for IgA lambda multiple myeloma who presents for a follow up visit. The patient's last visit was on 11/17/2023. In the interim since the last visit he has continued on maintenance Velcade.   On exam today Darren Hinton reports he has been well overall interim since her last visit.  He has had some instability on his feet but he is had no frank falls.  He reports that he is had no infectious symptoms such as runny nose, sore throat, or  cough.  He did have a flu like illness a while ago but was treated with amoxicillin and is subsequently improved.  Reports he is eating well and is unsure why his weight is down to 242 pounds.  He has dropped about 6 pounds since March.  He thinks he has a good appetite and is unsure why this is happening.  His energy levels are pretty good.  He is also using a rollator walker to try to help with his sciatica.  He has had no issues with numbness or tingling of his fingers or toes or diarrhea/constipation.  He is not having any numbness or tingling.Marland Kitchen He notes he is willing and able to proceed with Velcade at this time.  He denies fevers, chills, sweats, shortness of breath, chest pain or cough. He has no other complaints. Rest of the 10 point ROS is below.   MEDICAL HISTORY:  Past Medical History:  Diagnosis Date   Allergy    Anxiety    Arthritis    Depression    Heart murmur    Hyperlipidemia    Hyperplastic colon polyp    Hyperproteinemia    Hypertension    Hypothyroidism    Kidney disease    Mitral valve prolapse    Multiple myeloma (HCC)    Paroxysmal atrial fibrillation (HCC)    Thyroid disease     SURGICAL HISTORY: Past Surgical History:  Procedure Laterality Date   CARDIOVERSION N/A 03/14/2022   Procedure: CARDIOVERSION;  Surgeon: Chrystie Nose, MD;  Location: [facility] ENDOSCOPY;  Service: Cardiovascular;  Laterality: N/A;   TEE WITHOUT CARDIOVERSION N/A 03/14/2022  Procedure: TRANSESOPHAGEAL ECHOCARDIOGRAM (TEE);  Surgeon: Chrystie Nose, MD;  Location: [facility] ENDOSCOPY;  Service: Cardiovascular;  Laterality: N/A;    SOCIAL HISTORY: Social History   Socioeconomic History   Marital status: Married    Spouse name: Bonita Quin   Number of children: 2   Years of education: Not on file   Highest education level: Not on file  Occupational History   Occupation: retired  Tobacco Use   Smoking status: Former    Current packs/day: 0.00    Types: Cigarettes    Start date: 10/01/1958     Quit date: 10/01/1978    Years since quitting: 45.2   Smokeless tobacco: Never   Tobacco comments:    Former smoker 04/03/22  Vaping Use   Vaping status: Never Used  Substance and Sexual Activity   Alcohol use: Not Currently    Alcohol/week: 2.0 standard drinks of alcohol    Types: 2 Glasses of wine per week    Comment: None in many years   Drug use: No   Sexual activity: Not on file  Other Topics Concern   Not on file  Social History Narrative   Not on file   Social Drivers of Health   Financial Resource Strain: Not on file  Food Insecurity: Not on file  Transportation Needs: Not on file  Physical Activity: Not on file  Stress: Not on file  Social Connections: Not on file  Intimate Partner Violence: Not on file    FAMILY HISTORY: Family History  Problem Relation Age of Onset   Kidney disease Mother    Prostate cancer Father    Kidney disease Brother    Colon cancer Neg Hx    Esophageal cancer Neg Hx    Liver cancer Neg Hx    Pancreatic cancer Neg Hx    Rectal cancer Neg Hx    Stomach cancer Neg Hx     ALLERGIES:  is allergic to zithromax [azithromycin].  MEDICATIONS:  Current Outpatient Medications  Medication Sig Dispense Refill   acyclovir (ZOVIRAX) 400 MG tablet TAKE 1 TABLET BY MOUTH TWICE A DAY 180 tablet 1   allopurinol (ZYLOPRIM) 300 MG tablet TAKE 1 TABLET BY MOUTH EVERY DAY 90 tablet 1   ALPRAZolam (XANAX) 0.25 MG tablet Take 0.25 mg by mouth 2 (two) times daily as needed for anxiety.     amiodarone (PACERONE) 200 MG tablet TAKE 1 TABLET BY MOUTH EVERY DAY 90 tablet 3   amLODipine (NORVASC) 5 MG tablet TAKE 1 TABLET (5 MG TOTAL) BY MOUTH DAILY. 90 tablet 3   amoxicillin-clavulanate (AUGMENTIN) 875-125 MG tablet Take 1 tablet by mouth 2 (two) times daily.     apixaban (ELIQUIS) 5 MG TABS tablet Take 1 tablet (5 mg total) by mouth 2 (two) times daily. 180 tablet 1   dexamethasone (DECADRON) 4 MG tablet TAKE 10 TABLETS (40 MG TOTAL) BY MOUTH ONCE A WEEK  IN THE MORNING ON CHEMOTHERAPY DAYS 120 tablet 1   ferrous gluconate (FERGON) 324 MG tablet Take 1 tablet (324 mg total) by mouth 3 (three) times daily with meals. 90 tablet 0   fluticasone (FLONASE) 50 MCG/ACT nasal spray Place 2 sprays into the nose daily as needed for allergies.     gabapentin (NEURONTIN) 300 MG capsule TAKE 1 CAPSULE BY MOUTH TWICE A DAY 60 capsule 1   guaiFENesin (MUCINEX) 600 MG 12 hr tablet Take 600 mg by mouth 2 (two) times daily.     levothyroxine (SYNTHROID) 100 MCG tablet Take  100 mcg by mouth See admin instructions. Take 1 tablet (100 mcg) by mouth on Mondays, Tuesdays, Wednesdays, Thursdays, Fridays & Saturdays in the morning. Skip a dose on Sundays.     loratadine (CLARITIN) 10 MG tablet Take 10 mg by mouth daily as needed for allergies.     meclizine (ANTIVERT) 25 MG tablet Take 1 tablet (25 mg total) by mouth 3 (three) times daily as needed for dizziness. 30 tablet 0   metoprolol succinate (TOPROL-XL) 25 MG 24 hr tablet Take 1 tablet (25 mg total) by mouth daily. Take with or immediately following a meal. 30 tablet 3   Olmesartan-amLODIPine-HCTZ 20-5-12.5 MG TABS Take 1 tablet by mouth daily.     prochlorperazine (COMPAZINE) 10 MG tablet Take 1 tablet (10 mg total) by mouth every 6 (six) hours as needed for nausea or vomiting. 30 tablet 0   rosuvastatin (CRESTOR) 20 MG tablet 1 tablet Orally Once a day for 90 days     senna-docusate (SENOKOT-S) 8.6-50 MG tablet Take 1 tablet by mouth at bedtime as needed for mild constipation.     traMADol (ULTRAM) 50 MG tablet Take 1 tablet (50 mg total) by mouth every 6 (six) hours as needed. 30 tablet 0   vitamin B-12 (CYANOCOBALAMIN) 500 MCG tablet Take 500 mcg by mouth in the morning.     Current Facility-Administered Medications  Medication Dose Route Frequency Provider Last Rate Last Admin   0.9 %  sodium chloride infusion  500 mL Intravenous Once Armbruster, Willaim Rayas, MD       0.9 %  sodium chloride infusion  500 mL  Intravenous Once Armbruster, Willaim Rayas, MD        REVIEW OF SYSTEMS:   Constitutional: ( - ) fevers, ( - )  chills , ( - ) night sweats Eyes: ( - ) blurriness of vision, ( - ) double vision, ( - ) watery eyes Ears, nose, mouth, throat, and face: ( - ) mucositis, ( - ) sore throat Respiratory: ( - ) cough, ( - ) dyspnea, ( - ) wheezes Cardiovascular: ( - ) palpitation, ( - ) chest discomfort, (+ ) lower extremity swelling Gastrointestinal:  ( - ) nausea, ( - ) heartburn, ( - ) change in bowel habits Skin: ( - ) abnormal skin rashes Lymphatics: ( - ) new lymphadenopathy, ( - ) easy bruising Neurological: ( + ) numbness, ( - ) tingling, ( - ) new weaknesses Behavioral/Psych: ( - ) mood change, ( - ) new changes  All other systems were reviewed with the patient and are negative.  PHYSICAL EXAMINATION: ECOG PERFORMANCE STATUS: 1 - Symptomatic but completely ambulatory  Vitals:   12/14/23 1141  BP: 133/66  Pulse: (!) 51  Resp: 13  Temp: 98.3 F (36.8 C)  SpO2: 100%     Filed Weights   12/14/23 1141  Weight: 242 lb 12.8 oz (110.1 kg)    GENERAL: Chronically ill-appearing elderly African-American male, alert, no distress and comfortable SKIN: skin color, texture, turgor are normal, no rashes or significant lesions EYES: conjunctiva are pink and non-injected, sclera clear LUNGS: clear to auscultation and percussion with normal breathing effort HEART: regular rate & rhythm and no murmurs and + 2 bilateral ankle edema, wearing compression stockings.  Musculoskeletal: no cyanosis of digits and no clubbing  PSYCH: alert & oriented x 3, fluent speech NEURO: no focal motor/sensory deficits  LABORATORY DATA:  I have reviewed the data as listed    Latest Ref Rng &  Units 12/14/2023   10:37 AM 11/30/2023    2:51 PM 11/17/2023   10:53 AM  CBC  WBC 4.0 - 10.5 K/uL 4.8  5.3  5.7   Hemoglobin 13.0 - 17.0 g/dL 9.4  16.1  09.6   Hematocrit 39.0 - 52.0 % 30.0  32.5  32.1   Platelets 150 - 400  K/uL 191  214  227        Latest Ref Rng & Units 12/14/2023   10:37 AM 11/30/2023    2:51 PM 11/17/2023   10:53 AM  CMP  Glucose 70 - 99 mg/dL 045  94  98   BUN 8 - 23 mg/dL 26  32  21   Creatinine 0.61 - 1.24 mg/dL 4.09  8.11  9.14   Sodium 135 - 145 mmol/L 142  139  141   Potassium 3.5 - 5.1 mmol/L 4.2  5.1  4.3   Chloride 98 - 111 mmol/L 110  110  110   CO2 22 - 32 mmol/L 25  23  25    Calcium 8.9 - 10.3 mg/dL 9.1  9.0  9.0   Total Protein 6.5 - 8.1 g/dL 6.5  6.8  6.8   Total Bilirubin 0.0 - 1.2 mg/dL 0.4  0.4  0.5   Alkaline Phos 38 - 126 U/L 49  56  59   AST 15 - 41 U/L 24  44  27   ALT 0 - 44 U/L 27  82  25     Lab Results  Component Value Date   MPROTEIN Not Observed 11/17/2023   MPROTEIN Not Observed 10/19/2023   MPROTEIN Not Observed 10/06/2023   Lab Results  Component Value Date   KPAFRELGTCHN 16.4 12/14/2023   KPAFRELGTCHN 14.5 11/17/2023   KPAFRELGTCHN 18.3 10/19/2023   LAMBDASER 15.9 12/14/2023   LAMBDASER 13.3 11/17/2023   LAMBDASER 11.8 10/19/2023   KAPLAMBRATIO 1.03 12/14/2023   KAPLAMBRATIO 1.09 11/17/2023   KAPLAMBRATIO 1.55 10/19/2023    RADIOGRAPHIC STUDIES: No results found.  ASSESSMENT & PLAN Darren Hinton. Is a 78 y.o. male with medical history significant for IgA lambda multiple myeloma who presents for a follow up visit. .    # IgA lambda Multiple Myeloma -- Bone marrow biopsy performed on 01/16/2022 showed increased plasma cells consistent with a plasma cell neoplasm.  Normal FISH panel. --Patient meets diagnostic criteria based on kidney dysfunction and anemia. --Cycle 1 Day 1 of CyBorD chemotherapy started on 02/07/2022 --Velcade maintenance therapy started on 09/09/2022.  Plan: --Most recent SPEP from 10/19/2023 showed M protein not detected and normal serum free light chains --Labs today show white blood cell 4.8, hemoglobin 9.4, MCV 103.1, platelets 191.  Creatinine overall stable at 2.94. Calcium level is normal. LFTs normal. Myeloma  panel pending.  --continue on maintenance Velcade every 2 weeks with monthly clinic visits.  # Leg Pain -- Likely a component of neuropathy from his chemotherapy with pre-existing sciatica pain. -- continue gabapentin to 300 mg twice daily -- continue tramadol 50 mg every 6 hours as needed -- patient connected with Dr. Barbaraann Cao  #Supportive Care -- chemotherapy education complete -- port placement not required.  -- zofran 8mg  q8H PRN and compazine 10mg  PO q6H for nausea -- acyclovir 400mg  PO BID for VCZ prophylaxis --zometa to start after dental clearance.   No orders of the defined types were placed in this encounter.  All questions were answered. The patient knows to call the clinic with any problems, questions or concerns.  I have spent a total of 30 minutes minutes of face-to-face and non-face-to-face time, preparing to see the patient, performing a medically appropriate examination, counseling and educating the patient, documenting clinical information in the electronic health record,  and care coordination.   Ulysees Barns, MD Department of Hematology/Oncology [facility] Phone: [phone] Pager: [phone] Email: [email or web address]   12/15/2023 4:17 PM

## 2023-12-15 ENCOUNTER — Encounter: Payer: Self-pay | Admitting: Hematology and Oncology

## 2023-12-15 LAB — KAPPA/LAMBDA LIGHT CHAINS
Kappa free light chain: 16.4 mg/L (ref 3.3–19.4)
Kappa, lambda light chain ratio: 1.03 (ref 0.26–1.65)
Lambda free light chains: 15.9 mg/L (ref 5.7–26.3)

## 2023-12-16 LAB — MULTIPLE MYELOMA PANEL, SERUM
Albumin SerPl Elph-Mcnc: 3.3 g/dL (ref 2.9–4.4)
Albumin/Glob SerPl: 1.5 (ref 0.7–1.7)
Alpha 1: 0.3 g/dL (ref 0.0–0.4)
Alpha2 Glob SerPl Elph-Mcnc: 0.7 g/dL (ref 0.4–1.0)
B-Globulin SerPl Elph-Mcnc: 0.9 g/dL (ref 0.7–1.3)
Gamma Glob SerPl Elph-Mcnc: 0.5 g/dL (ref 0.4–1.8)
Globulin, Total: 2.3 g/dL (ref 2.2–3.9)
IgA: 44 mg/dL — ABNORMAL LOW (ref 61–437)
IgG (Immunoglobin G), Serum: 466 mg/dL — ABNORMAL LOW (ref 603–1613)
IgM (Immunoglobulin M), Srm: 45 mg/dL (ref 15–143)
Total Protein ELP: 5.6 g/dL — ABNORMAL LOW (ref 6.0–8.5)

## 2023-12-28 ENCOUNTER — Inpatient Hospital Stay

## 2023-12-28 VITALS — BP 130/66 | HR 52 | Temp 98.0°F | Resp 18 | Wt 248.8 lb

## 2023-12-28 DIAGNOSIS — C9000 Multiple myeloma not having achieved remission: Secondary | ICD-10-CM

## 2023-12-28 DIAGNOSIS — Z5112 Encounter for antineoplastic immunotherapy: Secondary | ICD-10-CM | POA: Diagnosis not present

## 2023-12-28 LAB — CMP (CANCER CENTER ONLY)
ALT: 18 U/L (ref 0–44)
AST: 20 U/L (ref 15–41)
Albumin: 4.2 g/dL (ref 3.5–5.0)
Alkaline Phosphatase: 49 U/L (ref 38–126)
Anion gap: 5 (ref 5–15)
BUN: 22 mg/dL (ref 8–23)
CO2: 25 mmol/L (ref 22–32)
Calcium: 9.2 mg/dL (ref 8.9–10.3)
Chloride: 111 mmol/L (ref 98–111)
Creatinine: 2.72 mg/dL — ABNORMAL HIGH (ref 0.61–1.24)
GFR, Estimated: 23 mL/min — ABNORMAL LOW (ref >60)
Glucose, Bld: 87 mg/dL (ref 70–99)
Potassium: 3.9 mmol/L (ref 3.5–5.1)
Sodium: 141 mmol/L (ref 135–145)
Total Bilirubin: 0.5 mg/dL (ref 0.0–1.2)
Total Protein: 6.4 g/dL — ABNORMAL LOW (ref 6.5–8.1)

## 2023-12-28 LAB — CBC WITH DIFFERENTIAL (CANCER CENTER ONLY)
Abs Immature Granulocytes: 0.02 10*3/uL (ref 0.00–0.07)
Basophils Absolute: 0 10*3/uL (ref 0.0–0.1)
Basophils Relative: 1 %
Eosinophils Absolute: 0.3 10*3/uL (ref 0.0–0.5)
Eosinophils Relative: 5 %
HCT: 29.3 % — ABNORMAL LOW (ref 39.0–52.0)
Hemoglobin: 9.3 g/dL — ABNORMAL LOW (ref 13.0–17.0)
Immature Granulocytes: 0 %
Lymphocytes Relative: 21 %
Lymphs Abs: 1 10*3/uL (ref 0.7–4.0)
MCH: 32.5 pg (ref 26.0–34.0)
MCHC: 31.7 g/dL (ref 30.0–36.0)
MCV: 102.4 fL — ABNORMAL HIGH (ref 80.0–100.0)
Monocytes Absolute: 0.8 10*3/uL (ref 0.1–1.0)
Monocytes Relative: 16 %
Neutro Abs: 2.8 10*3/uL (ref 1.7–7.7)
Neutrophils Relative %: 57 %
Platelet Count: 197 10*3/uL (ref 150–400)
RBC: 2.86 MIL/uL — ABNORMAL LOW (ref 4.22–5.81)
RDW: 16.7 % — ABNORMAL HIGH (ref 11.5–15.5)
WBC Count: 4.9 10*3/uL (ref 4.0–10.5)
nRBC: 0 % (ref 0.0–0.2)

## 2023-12-28 MED ORDER — PROCHLORPERAZINE MALEATE 10 MG PO TABS
10.0000 mg | ORAL_TABLET | Freq: Once | ORAL | Status: AC
  Administered 2023-12-28: 10 mg via ORAL
  Filled 2023-12-28: qty 1

## 2023-12-28 MED ORDER — BORTEZOMIB CHEMO SQ INJECTION 3.5 MG (2.5MG/ML)
1.3000 mg/m2 | Freq: Once | INTRAMUSCULAR | Status: AC
  Administered 2023-12-28: 3 mg via SUBCUTANEOUS
  Filled 2023-12-28: qty 1.2

## 2023-12-28 NOTE — Patient Instructions (Signed)
 [facility]  Discharge Instructions: Thank you for choosing [facility] to provide your oncology and hematology care.   If you have a lab appointment with the Cancer Center, please go directly to the Cancer Center and check in at the registration area.   Wear comfortable clothing and clothing appropriate for easy access to any Portacath or PICC line.   We strive to give you quality time with your provider. You may need to reschedule your appointment if you arrive late (15 or more minutes).  Arriving late affects you and other patients whose appointments are after yours.  Also, if you miss three or more appointments without notifying the office, you may be dismissed from the clinic at the provider's discretion.      For prescription refill requests, have your pharmacy contact our office and allow 72 hours for refills to be completed.    Today you received the following chemotherapy and/or immunotherapy agents: Velcade    To help prevent nausea and vomiting after your treatment, we encourage you to take your nausea medication as directed.  BELOW ARE SYMPTOMS THAT SHOULD BE REPORTED IMMEDIATELY: *FEVER GREATER THAN 100.4 F (38 C) OR HIGHER *CHILLS OR SWEATING *NAUSEA AND VOMITING THAT IS NOT CONTROLLED WITH YOUR NAUSEA MEDICATION *UNUSUAL SHORTNESS OF BREATH *UNUSUAL BRUISING OR BLEEDING *URINARY PROBLEMS (pain or burning when urinating, or frequent urination) *BOWEL PROBLEMS (unusual diarrhea, constipation, pain near the anus) TENDERNESS IN MOUTH AND THROAT WITH OR WITHOUT PRESENCE OF ULCERS (sore throat, sores in mouth, or a toothache) UNUSUAL RASH, SWELLING OR PAIN  UNUSUAL VAGINAL DISCHARGE OR ITCHING   Items with * indicate a potential emergency and should be followed up as soon as possible or go to the Emergency Department if any problems should occur.  Please show the CHEMOTHERAPY ALERT CARD or IMMUNOTHERAPY  ALERT CARD at check-in to the Emergency Department and triage nurse.  Should you have questions after your visit or need to cancel or reschedule your appointment, please contact [facility]  Dept: [phone]  and follow the prompts.  Office hours are 8:00 a.m. to 4:30 p.m. Monday - Friday. Please note that voicemails left after 4:00 p.m. may not be returned until the following business day.  We are closed weekends and major holidays. You have access to a nurse at all times for urgent questions. Please call the main number to the clinic Dept: [phone] and follow the prompts.   For any non-urgent questions, you may also contact your provider using MyChart. We now offer e-Visits for anyone 69 and older to request care online for non-urgent symptoms. For details visit [email or web address].   Also download the MyChart app! Go to the app store, search "MyChart", open the app, select , and log in with your MyChart username and password.

## 2024-01-04 ENCOUNTER — Other Ambulatory Visit: Payer: Self-pay | Admitting: Hematology and Oncology

## 2024-01-05 ENCOUNTER — Encounter: Payer: Self-pay | Admitting: Hematology and Oncology

## 2024-01-08 ENCOUNTER — Other Ambulatory Visit: Payer: Self-pay

## 2024-01-12 ENCOUNTER — Inpatient Hospital Stay: Attending: Hematology and Oncology

## 2024-01-12 ENCOUNTER — Inpatient Hospital Stay: Attending: Hematology and Oncology | Admitting: Physician Assistant

## 2024-01-12 ENCOUNTER — Inpatient Hospital Stay

## 2024-01-12 VITALS — BP 122/62 | HR 51 | Temp 98.6°F | Resp 13 | Wt 248.3 lb

## 2024-01-12 DIAGNOSIS — Z87891 Personal history of nicotine dependence: Secondary | ICD-10-CM | POA: Diagnosis not present

## 2024-01-12 DIAGNOSIS — Z7969 Long term (current) use of other immunomodulators and immunosuppressants: Secondary | ICD-10-CM | POA: Diagnosis not present

## 2024-01-12 DIAGNOSIS — C9000 Multiple myeloma not having achieved remission: Secondary | ICD-10-CM | POA: Diagnosis present

## 2024-01-12 DIAGNOSIS — Z860102 Personal history of hyperplastic colon polyps: Secondary | ICD-10-CM | POA: Diagnosis not present

## 2024-01-12 DIAGNOSIS — Z79624 Long term (current) use of inhibitors of nucleotide synthesis: Secondary | ICD-10-CM | POA: Insufficient documentation

## 2024-01-12 DIAGNOSIS — Z7901 Long term (current) use of anticoagulants: Secondary | ICD-10-CM | POA: Diagnosis not present

## 2024-01-12 DIAGNOSIS — I341 Nonrheumatic mitral (valve) prolapse: Secondary | ICD-10-CM | POA: Insufficient documentation

## 2024-01-12 DIAGNOSIS — Z5112 Encounter for antineoplastic immunotherapy: Secondary | ICD-10-CM | POA: Insufficient documentation

## 2024-01-12 DIAGNOSIS — Z881 Allergy status to other antibiotic agents status: Secondary | ICD-10-CM | POA: Insufficient documentation

## 2024-01-12 DIAGNOSIS — Z79899 Other long term (current) drug therapy: Secondary | ICD-10-CM | POA: Insufficient documentation

## 2024-01-12 DIAGNOSIS — G629 Polyneuropathy, unspecified: Secondary | ICD-10-CM | POA: Diagnosis not present

## 2024-01-12 DIAGNOSIS — I480 Paroxysmal atrial fibrillation: Secondary | ICD-10-CM | POA: Insufficient documentation

## 2024-01-12 LAB — CBC WITH DIFFERENTIAL (CANCER CENTER ONLY)
Abs Immature Granulocytes: 0.01 10*3/uL (ref 0.00–0.07)
Basophils Absolute: 0.1 10*3/uL (ref 0.0–0.1)
Basophils Relative: 1 %
Eosinophils Absolute: 0.3 10*3/uL (ref 0.0–0.5)
Eosinophils Relative: 5 %
HCT: 29.8 % — ABNORMAL LOW (ref 39.0–52.0)
Hemoglobin: 9.7 g/dL — ABNORMAL LOW (ref 13.0–17.0)
Immature Granulocytes: 0 %
Lymphocytes Relative: 19 %
Lymphs Abs: 1.1 10*3/uL (ref 0.7–4.0)
MCH: 33.1 pg (ref 26.0–34.0)
MCHC: 32.6 g/dL (ref 30.0–36.0)
MCV: 101.7 fL — ABNORMAL HIGH (ref 80.0–100.0)
Monocytes Absolute: 1 10*3/uL (ref 0.1–1.0)
Monocytes Relative: 18 %
Neutro Abs: 3.1 10*3/uL (ref 1.7–7.7)
Neutrophils Relative %: 57 %
Platelet Count: 206 10*3/uL (ref 150–400)
RBC: 2.93 MIL/uL — ABNORMAL LOW (ref 4.22–5.81)
RDW: 16.5 % — ABNORMAL HIGH (ref 11.5–15.5)
WBC Count: 5.5 10*3/uL (ref 4.0–10.5)
nRBC: 0 % (ref 0.0–0.2)

## 2024-01-12 LAB — CMP (CANCER CENTER ONLY)
ALT: 17 U/L (ref 0–44)
AST: 22 U/L (ref 15–41)
Albumin: 4.1 g/dL (ref 3.5–5.0)
Alkaline Phosphatase: 52 U/L (ref 38–126)
Anion gap: 6 (ref 5–15)
BUN: 18 mg/dL (ref 8–23)
CO2: 26 mmol/L (ref 22–32)
Calcium: 9 mg/dL (ref 8.9–10.3)
Chloride: 110 mmol/L (ref 98–111)
Creatinine: 2.69 mg/dL — ABNORMAL HIGH (ref 0.61–1.24)
GFR, Estimated: 23 mL/min — ABNORMAL LOW (ref >60)
Glucose, Bld: 96 mg/dL (ref 70–99)
Potassium: 4.1 mmol/L (ref 3.5–5.1)
Sodium: 142 mmol/L (ref 135–145)
Total Bilirubin: 0.5 mg/dL (ref 0.0–1.2)
Total Protein: 6.3 g/dL — ABNORMAL LOW (ref 6.5–8.1)

## 2024-01-12 MED ORDER — BORTEZOMIB CHEMO SQ INJECTION 3.5 MG (2.5MG/ML)
1.3000 mg/m2 | Freq: Once | INTRAMUSCULAR | Status: AC
  Administered 2024-01-12: 3 mg via SUBCUTANEOUS
  Filled 2024-01-12: qty 1.2

## 2024-01-12 MED ORDER — PROCHLORPERAZINE MALEATE 10 MG PO TABS
10.0000 mg | ORAL_TABLET | Freq: Once | ORAL | Status: AC
  Administered 2024-01-12: 10 mg via ORAL
  Filled 2024-01-12: qty 1

## 2024-01-12 NOTE — Patient Instructions (Signed)
 [facility]  Discharge Instructions: Thank you for choosing [facility] to provide your oncology and hematology care.   If you have a lab appointment with the Cancer Center, please go directly to the Cancer Center and check in at the registration area.   Wear comfortable clothing and clothing appropriate for easy access to any Portacath or PICC line.   We strive to give you quality time with your provider. You may need to reschedule your appointment if you arrive late (15 or more minutes).  Arriving late affects you and other patients whose appointments are after yours.  Also, if you miss three or more appointments without notifying the office, you may be dismissed from the clinic at the provider's discretion.      For prescription refill requests, have your pharmacy contact our office and allow 72 hours for refills to be completed.    Today you received the following chemotherapy and/or immunotherapy agents velcade      To help prevent nausea and vomiting after your treatment, we encourage you to take your nausea medication as directed.  BELOW ARE SYMPTOMS THAT SHOULD BE REPORTED IMMEDIATELY: *FEVER GREATER THAN 100.4 F (38 C) OR HIGHER *CHILLS OR SWEATING *NAUSEA AND VOMITING THAT IS NOT CONTROLLED WITH YOUR NAUSEA MEDICATION *UNUSUAL SHORTNESS OF BREATH *UNUSUAL BRUISING OR BLEEDING *URINARY PROBLEMS (pain or burning when urinating, or frequent urination) *BOWEL PROBLEMS (unusual diarrhea, constipation, pain near the anus) TENDERNESS IN MOUTH AND THROAT WITH OR WITHOUT PRESENCE OF ULCERS (sore throat, sores in mouth, or a toothache) UNUSUAL RASH, SWELLING OR PAIN  UNUSUAL VAGINAL DISCHARGE OR ITCHING   Items with * indicate a potential emergency and should be followed up as soon as possible or go to the Emergency Department if any problems should occur.  Please show the CHEMOTHERAPY ALERT CARD or IMMUNOTHERAPY  ALERT CARD at check-in to the Emergency Department and triage nurse.  Should you have questions after your visit or need to cancel or reschedule your appointment, please contact [facility]  Dept: [phone]  and follow the prompts.  Office hours are 8:00 a.m. to 4:30 p.m. Monday - Friday. Please note that voicemails left after 4:00 p.m. may not be returned until the following business day.  We are closed weekends and major holidays. You have access to a nurse at all times for urgent questions. Please call the main number to the clinic Dept: [phone] and follow the prompts.   For any non-urgent questions, you may also contact your provider using MyChart. We now offer e-Visits for anyone 85 and older to request care online for non-urgent symptoms. For details visit [email or web address].   Also download the MyChart app! Go to the app store, search "MyChart", open the app, select [place], and log in with your MyChart username and password.

## 2024-01-12 NOTE — Progress Notes (Signed)
 [facility] [phone]   [phone]  PROGRESS NOTE  Patient Care Team: Bertha Broad, MD as PCP - General (Internal Medicine)  Hematological/Oncological History # IgA Lambda Multiple Myeloma 05/03/2015: last visit with Dr. Marguerita Shih at the [facility]. Was followed for IgA lambda MGUS. 12/11/2021: labs show M protein 3.4, Kappa 19.2, Lambda 1576.4, ratio 0.01. Cr 3.47, Hgb 8.6, WBC 5.7, MCV 99, Plt 221 12/23/2021: establish care with Dr. Rosaline Coma  01/16/2022: Bone marrow biopsy performed, showed a 60% cellular bone marrow predominantly comprised of plasma cells making up 70 to 80%, lambda restricted.  Myeloma FISH panel showed no evidence of abnormalities. 02/07/2022: Cycle 1 Day 1 of CyBorD chemotherapy.  03/09/2022-03/17/2022: hospitalized for fever/pneumonia.  03/24/2022: Cycle 2 Day 1 of CyBorD chemotherapy.  04/22/2022: Cycle 3 Day 1 of CyBorD chemotherapy 05/19/2022-05/26/2022: Delayed start of Cycle 4 Day 1 of CyBorD chemotherapy due to neutropenia.  06/03/2022: Cycle 4 Day 1 of CyBorD chemotherapy. 25% dose reduction of cyclophosphamide  due to cytopenias. 06/30/2022: Cycle 4 Day 22. Drop Velcade  dose to 1mg /m2 and Cyclophosphamide  200 mg/m2  07/07/2022: Cycle 5 Day 1 of CyBorD chemotherapy with dose reductions.  08/04/2022: Cycle 6 Day 1 of CyBorD chemotherapy with dose reductions.  09/09/2022: transition to maintenance dose Velcade  q 2 weeks.   Interval History:  Darren Hinton. 78 y.o. male with medical history significant for IgA lambda multiple myeloma who presents for a follow up visit. The patient's last visit was on 12/14/2023. In the interim since the last visit he has continued on maintenance Velcade .   On exam today Darren Hinton reports he has been well overall interim since her last visit.  He reports his energy levels and appetite are fairly stable.  He denies any major changes to his weight.  He continues to have neuropathy that mainly  affects his lower extremities which impacts his ambulation.  In addition he struggles with sciatica which leaves him feeling restless when pain occurs.  He denies nausea, vomiting or bowel habit changes.  He denies any easy bruising or signs of active bleeding. He denies fevers, chills, sweats, shortness of breath, chest pain or cough. He has no other complaints. Rest of the 10 point ROS is below.   MEDICAL HISTORY:  Past Medical History:  Diagnosis Date   Allergy    Anxiety    Arthritis    Depression    Heart murmur    Hyperlipidemia    Hyperplastic colon polyp    Hyperproteinemia    Hypertension    Hypothyroidism    Kidney disease    Mitral valve prolapse    Multiple myeloma (HCC)    Paroxysmal atrial fibrillation (HCC)    Thyroid disease     SURGICAL HISTORY: Past Surgical History:  Procedure Laterality Date   CARDIOVERSION N/A 03/14/2022   Procedure: CARDIOVERSION;  Surgeon: Hazle Lites, MD;  Location: [facility] ENDOSCOPY;  Service: Cardiovascular;  Laterality: N/A;   TEE WITHOUT CARDIOVERSION N/A 03/14/2022   Procedure: TRANSESOPHAGEAL ECHOCARDIOGRAM (TEE);  Surgeon: Hazle Lites, MD;  Location: [facility] ENDOSCOPY;  Service: Cardiovascular;  Laterality: N/A;    SOCIAL HISTORY: Social History   Socioeconomic History   Marital status: Married    Spouse name: Stana Ear   Number of children: 2   Years of education: Not on file   Highest education level: Not on file  Occupational History   Occupation: retired  Tobacco Use   Smoking status: Former    Current packs/day: 0.00  Types: Cigarettes    Start date: 10/01/1958    Quit date: 10/01/1978    Years since quitting: 45.3   Smokeless tobacco: Never   Tobacco comments:    Former smoker 04/03/22  Vaping Use   Vaping status: Never Used  Substance and Sexual Activity   Alcohol use: Not Currently    Alcohol/week: 2.0 standard drinks of alcohol    Types: 2 Glasses of wine per week    Comment: None in many years   Drug use: No    Sexual activity: Not on file  Other Topics Concern   Not on file  Social History Narrative   Not on file   Social Drivers of Health   Financial Resource Strain: Not on file  Food Insecurity: Not on file  Transportation Needs: Not on file  Physical Activity: Not on file  Stress: Not on file  Social Connections: Not on file  Intimate Partner Violence: Not on file    FAMILY HISTORY: Family History  Problem Relation Age of Onset   Kidney disease Mother    Prostate cancer Father    Kidney disease Brother    Colon cancer Neg Hx    Esophageal cancer Neg Hx    Liver cancer Neg Hx    Pancreatic cancer Neg Hx    Rectal cancer Neg Hx    Stomach cancer Neg Hx     ALLERGIES:  is allergic to zithromax  [azithromycin ].  MEDICATIONS:  Current Outpatient Medications  Medication Sig Dispense Refill   acyclovir  (ZOVIRAX ) 400 MG tablet TAKE 1 TABLET BY MOUTH TWICE A DAY 180 tablet 1   allopurinol  (ZYLOPRIM ) 300 MG tablet TAKE 1 TABLET BY MOUTH EVERY DAY 90 tablet 1   ALPRAZolam (XANAX) 0.25 MG tablet Take 0.25 mg by mouth 2 (two) times daily as needed for anxiety.     amiodarone  (PACERONE ) 200 MG tablet TAKE 1 TABLET BY MOUTH EVERY DAY 90 tablet 3   amLODipine  (NORVASC ) 5 MG tablet TAKE 1 TABLET (5 MG TOTAL) BY MOUTH DAILY. 90 tablet 3   amoxicillin-clavulanate (AUGMENTIN) 875-125 MG tablet Take 1 tablet by mouth 2 (two) times daily.     dexamethasone  (DECADRON ) 4 MG tablet TAKE 10 TABLETS (40 MG TOTAL) BY MOUTH ONCE A WEEK IN THE MORNING ON CHEMOTHERAPY DAYS 120 tablet 1   ferrous gluconate  (FERGON) 324 MG tablet Take 1 tablet (324 mg total) by mouth 3 (three) times daily with meals. 90 tablet 0   fluticasone (FLONASE) 50 MCG/ACT nasal spray Place 2 sprays into the nose daily as needed for allergies.     gabapentin  (NEURONTIN ) 300 MG capsule TAKE 1 CAPSULE BY MOUTH TWICE A DAY 60 capsule 1   guaiFENesin  (MUCINEX ) 600 MG 12 hr tablet Take 600 mg by mouth 2 (two) times daily.      levothyroxine  (SYNTHROID ) 100 MCG tablet Take 100 mcg by mouth See admin instructions. Take 1 tablet (100 mcg) by mouth on Mondays, Tuesdays, Wednesdays, Thursdays, Fridays & Saturdays in the morning. Skip a dose on Sundays.     loratadine (CLARITIN) 10 MG tablet Take 10 mg by mouth daily as needed for allergies.     meclizine  (ANTIVERT ) 25 MG tablet Take 1 tablet (25 mg total) by mouth 3 (three) times daily as needed for dizziness. 30 tablet 0   metoprolol  succinate (TOPROL -XL) 25 MG 24 hr tablet Take 1 tablet (25 mg total) by mouth daily. Take with or immediately following a meal. 30 tablet 3   Olmesartan-amLODIPine -HCTZ 20-5-12.5 MG  TABS Take 1 tablet by mouth daily.     prochlorperazine  (COMPAZINE ) 10 MG tablet Take 1 tablet (10 mg total) by mouth every 6 (six) hours as needed for nausea or vomiting. 30 tablet 0   rosuvastatin (CRESTOR) 20 MG tablet 1 tablet Orally Once a day for 90 days     senna-docusate (SENOKOT-S) 8.6-50 MG tablet Take 1 tablet by mouth at bedtime as needed for mild constipation.     traMADol  (ULTRAM ) 50 MG tablet Take 1 tablet (50 mg total) by mouth every 6 (six) hours as needed. 30 tablet 0   vitamin B-12 (CYANOCOBALAMIN ) 500 MCG tablet Take 500 mcg by mouth in the morning.     apixaban  (ELIQUIS ) 5 MG TABS tablet Take 1 tablet (5 mg total) by mouth 2 (two) times daily. 180 tablet 1   Current Facility-Administered Medications  Medication Dose Route Frequency Provider Last Rate Last Admin   0.9 %  sodium chloride  infusion  500 mL Intravenous Once Armbruster, Lendon Queen, MD       0.9 %  sodium chloride  infusion  500 mL Intravenous Once Armbruster, Lendon Queen, MD        REVIEW OF SYSTEMS:   Constitutional: ( - ) fevers, ( - )  chills , ( - ) night sweats Eyes: ( - ) blurriness of vision, ( - ) double vision, ( - ) watery eyes Ears, nose, mouth, throat, and face: ( - ) mucositis, ( - ) sore throat Respiratory: ( - ) cough, ( - ) dyspnea, ( - ) wheezes Cardiovascular: ( - )  palpitation, ( - ) chest discomfort, (+ ) lower extremity swelling Gastrointestinal:  ( - ) nausea, ( - ) heartburn, ( - ) change in bowel habits Skin: ( - ) abnormal skin rashes Lymphatics: ( - ) new lymphadenopathy, ( - ) easy bruising Neurological: ( + ) numbness, ( - ) tingling, ( - ) new weaknesses Behavioral/Psych: ( - ) mood change, ( - ) new changes  All other systems were reviewed with the patient and are negative.  PHYSICAL EXAMINATION: ECOG PERFORMANCE STATUS: 1 - Symptomatic but completely ambulatory  Vitals:   01/12/24 0846  BP: 122/62  Pulse: (!) 51  Resp: 13  Temp: 98.6 F (37 C)  SpO2: 96%     Filed Weights   01/12/24 0846  Weight: 248 lb 4.8 oz (112.6 kg)    GENERAL: Chronically ill-appearing elderly African-American male, alert, no distress and comfortable SKIN: skin color, texture, turgor are normal, no rashes or significant lesions EYES: conjunctiva are pink and non-injected, sclera clear LUNGS: clear to auscultation and percussion with normal breathing effort HEART: regular rate & rhythm and no murmurs and + 2 bilateral ankle edema, wearing compression stockings.  Musculoskeletal: no cyanosis of digits and no clubbing  PSYCH: alert & oriented x 3, fluent speech NEURO: no focal motor/sensory deficits  LABORATORY DATA:  I have reviewed the data as listed    Latest Ref Rng & Units 01/12/2024    8:19 AM 12/28/2023    8:31 AM 12/14/2023   10:37 AM  CBC  WBC 4.0 - 10.5 K/uL 5.5  4.9  4.8   Hemoglobin 13.0 - 17.0 g/dL 9.7  9.3  9.4   Hematocrit 39.0 - 52.0 % 29.8  29.3  30.0   Platelets 150 - 400 K/uL 206  197  191        Latest Ref Rng & Units 12/28/2023    8:31 AM 12/14/2023  10:37 AM 11/30/2023    2:51 PM  CMP  Glucose 70 - 99 mg/dL 87  952  94   BUN 8 - 23 mg/dL 22  26  32   Creatinine 0.61 - 1.24 mg/dL 8.41  3.24  4.01   Sodium 135 - 145 mmol/L 141  142  139   Potassium 3.5 - 5.1 mmol/L 3.9  4.2  5.1   Chloride 98 - 111 mmol/L 111  110  110    CO2 22 - 32 mmol/L 25  25  23    Calcium 8.9 - 10.3 mg/dL 9.2  9.1  9.0   Total Protein 6.5 - 8.1 g/dL 6.4  6.5  6.8   Total Bilirubin 0.0 - 1.2 mg/dL 0.5  0.4  0.4   Alkaline Phos 38 - 126 U/L 49  49  56   AST 15 - 41 U/L 20  24  44   ALT 0 - 44 U/L 18  27  82     Lab Results  Component Value Date   MPROTEIN Not Observed 12/14/2023   MPROTEIN Not Observed 11/17/2023   MPROTEIN Not Observed 10/19/2023   Lab Results  Component Value Date   KPAFRELGTCHN 16.4 12/14/2023   KPAFRELGTCHN 14.5 11/17/2023   KPAFRELGTCHN 18.3 10/19/2023   LAMBDASER 15.9 12/14/2023   LAMBDASER 13.3 11/17/2023   LAMBDASER 11.8 10/19/2023   KAPLAMBRATIO 1.03 12/14/2023   KAPLAMBRATIO 1.09 11/17/2023   KAPLAMBRATIO 1.55 10/19/2023    RADIOGRAPHIC STUDIES: No results found.  ASSESSMENT & PLAN Darren Hinton. Is a 78 y.o. male with medical history significant for IgA lambda multiple myeloma who presents for a follow up visit. .    # IgA lambda Multiple Myeloma -- Bone marrow biopsy performed on 01/16/2022 showed increased plasma cells consistent with a plasma cell neoplasm.  Normal FISH panel. --Patient meets diagnostic criteria based on kidney dysfunction and anemia. --Cycle 1 Day 1 of CyBorD chemotherapy started on 02/07/2022 --Velcade  maintenance therapy started on 09/09/2022.  Plan: --Most recent SPEP from 12/14/2023 showed M protein not detected and normal serum free light chains --Labs today show white blood cell 5.5, hemoglobin 9.7, MCV 101.7, platelets 206.  Creatinine overall stable at 2.69. Calcium level is normal. LFTs normal. Myeloma panel pending.  --continue on maintenance Velcade  every 2 weeks with monthly clinic visits.  # Leg Pain -- Likely a component of neuropathy from his chemotherapy with pre-existing sciatica pain. -- continue gabapentin  to 300 mg twice daily -- continue tramadol  50 mg every 6 hours as needed -- patient connected with Dr. Mark Sil  #Supportive Care --  chemotherapy education complete -- port placement not required.  -- zofran  8mg  q8H PRN and compazine  10mg  PO q6H for nausea -- acyclovir  400mg  PO BID for VCZ prophylaxis --zometa to start after dental clearance.   No orders of the defined types were placed in this encounter.  All questions were answered. The patient knows to call the clinic with any problems, questions or concerns.  I have spent a total of 30 minutes minutes of face-to-face and non-face-to-face time, preparing to see the patient, performing a medically appropriate examination, counseling and educating the patient, documenting clinical information in the electronic health record,  and care coordination.   Wyline Hearing PA-C Dept of Hematology and Oncology [facility] at [facility] Phone: [phone]    01/12/2024 8:54 AM

## 2024-01-13 LAB — KAPPA/LAMBDA LIGHT CHAINS
Kappa free light chain: 14.4 mg/L (ref 3.3–19.4)
Kappa, lambda light chain ratio: 1.04 (ref 0.26–1.65)
Lambda free light chains: 13.9 mg/L (ref 5.7–26.3)

## 2024-01-14 LAB — MULTIPLE MYELOMA PANEL, SERUM
Albumin SerPl Elph-Mcnc: 3.6 g/dL (ref 2.9–4.4)
Albumin/Glob SerPl: 1.8 — ABNORMAL HIGH (ref 0.7–1.7)
Alpha 1: 0.2 g/dL (ref 0.0–0.4)
Alpha2 Glob SerPl Elph-Mcnc: 0.6 g/dL (ref 0.4–1.0)
B-Globulin SerPl Elph-Mcnc: 0.9 g/dL (ref 0.7–1.3)
Gamma Glob SerPl Elph-Mcnc: 0.4 g/dL (ref 0.4–1.8)
Globulin, Total: 2.1 g/dL — ABNORMAL LOW (ref 2.2–3.9)
IgA: 51 mg/dL — ABNORMAL LOW (ref 61–437)
IgG (Immunoglobin G), Serum: 450 mg/dL — ABNORMAL LOW (ref 603–1613)
IgM (Immunoglobulin M), Srm: 53 mg/dL (ref 15–143)
Total Protein ELP: 5.7 g/dL — ABNORMAL LOW (ref 6.0–8.5)

## 2024-01-16 ENCOUNTER — Other Ambulatory Visit: Payer: Self-pay

## 2024-01-25 ENCOUNTER — Inpatient Hospital Stay

## 2024-01-25 VITALS — BP 131/66 | HR 52 | Temp 98.2°F | Resp 14 | Wt 247.0 lb

## 2024-01-25 DIAGNOSIS — C9000 Multiple myeloma not having achieved remission: Secondary | ICD-10-CM

## 2024-01-25 DIAGNOSIS — Z5112 Encounter for antineoplastic immunotherapy: Secondary | ICD-10-CM | POA: Diagnosis not present

## 2024-01-25 LAB — CMP (CANCER CENTER ONLY)
ALT: 16 U/L (ref 0–44)
AST: 19 U/L (ref 15–41)
Albumin: 4.1 g/dL (ref 3.5–5.0)
Alkaline Phosphatase: 44 U/L (ref 38–126)
Anion gap: 7 (ref 5–15)
BUN: 22 mg/dL (ref 8–23)
CO2: 26 mmol/L (ref 22–32)
Calcium: 8.8 mg/dL — ABNORMAL LOW (ref 8.9–10.3)
Chloride: 111 mmol/L (ref 98–111)
Creatinine: 2.74 mg/dL — ABNORMAL HIGH (ref 0.61–1.24)
GFR, Estimated: 23 mL/min — ABNORMAL LOW (ref >60)
Glucose, Bld: 91 mg/dL (ref 70–99)
Potassium: 4.1 mmol/L (ref 3.5–5.1)
Sodium: 144 mmol/L (ref 135–145)
Total Bilirubin: 0.5 mg/dL (ref 0.0–1.2)
Total Protein: 6.3 g/dL — ABNORMAL LOW (ref 6.5–8.1)

## 2024-01-25 LAB — CBC WITH DIFFERENTIAL (CANCER CENTER ONLY)
Abs Immature Granulocytes: 0.01 10*3/uL (ref 0.00–0.07)
Basophils Absolute: 0 10*3/uL (ref 0.0–0.1)
Basophils Relative: 1 %
Eosinophils Absolute: 0.3 10*3/uL (ref 0.0–0.5)
Eosinophils Relative: 6 %
HCT: 31 % — ABNORMAL LOW (ref 39.0–52.0)
Hemoglobin: 9.9 g/dL — ABNORMAL LOW (ref 13.0–17.0)
Immature Granulocytes: 0 %
Lymphocytes Relative: 23 %
Lymphs Abs: 1 10*3/uL (ref 0.7–4.0)
MCH: 32.9 pg (ref 26.0–34.0)
MCHC: 31.9 g/dL (ref 30.0–36.0)
MCV: 103 fL — ABNORMAL HIGH (ref 80.0–100.0)
Monocytes Absolute: 0.7 10*3/uL (ref 0.1–1.0)
Monocytes Relative: 15 %
Neutro Abs: 2.5 10*3/uL (ref 1.7–7.7)
Neutrophils Relative %: 55 %
Platelet Count: 187 10*3/uL (ref 150–400)
RBC: 3.01 MIL/uL — ABNORMAL LOW (ref 4.22–5.81)
RDW: 15.9 % — ABNORMAL HIGH (ref 11.5–15.5)
WBC Count: 4.5 10*3/uL (ref 4.0–10.5)
nRBC: 0 % (ref 0.0–0.2)

## 2024-01-25 MED ORDER — PROCHLORPERAZINE MALEATE 10 MG PO TABS
10.0000 mg | ORAL_TABLET | Freq: Once | ORAL | Status: AC
  Administered 2024-01-25: 10 mg via ORAL
  Filled 2024-01-25: qty 1

## 2024-01-25 MED ORDER — BORTEZOMIB CHEMO SQ INJECTION 3.5 MG (2.5MG/ML)
1.3000 mg/m2 | Freq: Once | INTRAMUSCULAR | Status: AC
  Administered 2024-01-25: 3 mg via SUBCUTANEOUS
  Filled 2024-01-25: qty 1.2

## 2024-01-25 NOTE — Patient Instructions (Signed)
 [facility]  Discharge Instructions: Thank you for choosing [facility] to provide your oncology and hematology care.   If you have a lab appointment with the Cancer Center, please go directly to the Cancer Center and check in at the registration area.   Wear comfortable clothing and clothing appropriate for easy access to any Portacath or PICC line.   We strive to give you quality time with your provider. You may need to reschedule your appointment if you arrive late (15 or more minutes).  Arriving late affects you and other patients whose appointments are after yours.  Also, if you miss three or more appointments without notifying the office, you may be dismissed from the clinic at the provider's discretion.      For prescription refill requests, have your pharmacy contact our office and allow 72 hours for refills to be completed.    Today you received the following chemotherapy and/or immunotherapy agents velcade      To help prevent nausea and vomiting after your treatment, we encourage you to take your nausea medication as directed.  BELOW ARE SYMPTOMS THAT SHOULD BE REPORTED IMMEDIATELY: *FEVER GREATER THAN 100.4 F (38 C) OR HIGHER *CHILLS OR SWEATING *NAUSEA AND VOMITING THAT IS NOT CONTROLLED WITH YOUR NAUSEA MEDICATION *UNUSUAL SHORTNESS OF BREATH *UNUSUAL BRUISING OR BLEEDING *URINARY PROBLEMS (pain or burning when urinating, or frequent urination) *BOWEL PROBLEMS (unusual diarrhea, constipation, pain near the anus) TENDERNESS IN MOUTH AND THROAT WITH OR WITHOUT PRESENCE OF ULCERS (sore throat, sores in mouth, or a toothache) UNUSUAL RASH, SWELLING OR PAIN  UNUSUAL VAGINAL DISCHARGE OR ITCHING   Items with * indicate a potential emergency and should be followed up as soon as possible or go to the Emergency Department if any problems should occur.  Please show the CHEMOTHERAPY ALERT CARD or IMMUNOTHERAPY  ALERT CARD at check-in to the Emergency Department and triage nurse.  Should you have questions after your visit or need to cancel or reschedule your appointment, please contact [facility]  Dept: [phone]  and follow the prompts.  Office hours are 8:00 a.m. to 4:30 p.m. Monday - Friday. Please note that voicemails left after 4:00 p.m. may not be returned until the following business day.  We are closed weekends and major holidays. You have access to a nurse at all times for urgent questions. Please call the main number to the clinic Dept: [phone] and follow the prompts.   For any non-urgent questions, you may also contact your provider using MyChart. We now offer e-Visits for anyone 85 and older to request care online for non-urgent symptoms. For details visit [email or web address].   Also download the MyChart app! Go to the app store, search "MyChart", open the app, select [place], and log in with your MyChart username and password.

## 2024-01-26 ENCOUNTER — Other Ambulatory Visit: Payer: Self-pay

## 2024-02-03 ENCOUNTER — Telehealth: Payer: Self-pay | Admitting: Hematology and Oncology

## 2024-02-04 ENCOUNTER — Other Ambulatory Visit: Payer: Self-pay

## 2024-02-07 NOTE — Progress Notes (Unsigned)
 [facility] [phone]   [phone]  PROGRESS NOTE  Patient Care Team: Bertha Broad, MD as PCP - General (Internal Medicine)  Hematological/Oncological History # IgA Lambda Multiple Myeloma 05/03/2015: last visit with Dr. Marguerita Shih at the [facility]. Was followed for IgA lambda MGUS. 12/11/2021: labs show M protein 3.4, Kappa 19.2, Lambda 1576.4, ratio 0.01. Cr 3.47, Hgb 8.6, WBC 5.7, MCV 99, Plt 221 12/23/2021: establish care with Dr. Rosaline Coma  01/16/2022: Bone marrow biopsy performed, showed a 60% cellular bone marrow predominantly comprised of plasma cells making up 70 to 80%, lambda restricted.  Myeloma FISH panel showed no evidence of abnormalities. 02/07/2022: Cycle 1 Day 1 of CyBorD chemotherapy.  03/09/2022-03/17/2022: hospitalized for fever/pneumonia.  03/24/2022: Cycle 2 Day 1 of CyBorD chemotherapy.  04/22/2022: Cycle 3 Day 1 of CyBorD chemotherapy 05/19/2022-05/26/2022: Delayed start of Cycle 4 Day 1 of CyBorD chemotherapy due to neutropenia.  06/03/2022: Cycle 4 Day 1 of CyBorD chemotherapy. 25% dose reduction of cyclophosphamide  due to cytopenias. 06/30/2022: Cycle 4 Day 22. Drop Velcade  dose to 1mg /m2 and Cyclophosphamide  200 mg/m2  07/07/2022: Cycle 5 Day 1 of CyBorD chemotherapy with dose reductions.  08/04/2022: Cycle 6 Day 1 of CyBorD chemotherapy with dose reductions.  09/09/2022: transition to maintenance dose Velcade  q 2 weeks.   Interval History:  Darren Hinton. 78 y.o. male with medical history significant for IgA lambda multiple myeloma who presents for a follow up visit. The patient's last visit was on 01/12/2024. In the interim since the last visit he has continued on maintenance Velcade .   On exam today Darren Hinton reports he is looking forward to his upcoming trip to [place] to visit his son.  His next treatment will be 3 weeks from now so the usual to so he can take this trip.  He notes he has been well overall in the interim  since her last visit with no recent illnesses.  He reports he continues to have weakness in his legs but his hands have been okay.  He does have some occasional feelings of dizziness.  He was recently taken off his Synthroid  medication.  He reports his energy and appetite are both quite good.  He notes he continues taking his iron  pill with no stomach upset such as nausea, vomiting, diarrhea, or constipation.  He has no upcoming planned surgery or dental work.  He denies any bleeding, bruising, or dark stools.  Overall he feels well and is willing and able to proceed with Velcade  therapy at this time.  Full 10 point ROS is otherwise negative.  MEDICAL HISTORY:  Past Medical History:  Diagnosis Date   Allergy    Anxiety    Arthritis    Depression    Heart murmur    Hyperlipidemia    Hyperplastic colon polyp    Hyperproteinemia    Hypertension    Hypothyroidism    Kidney disease    Mitral valve prolapse    Multiple myeloma (HCC)    Paroxysmal atrial fibrillation (HCC)    Thyroid disease     SURGICAL HISTORY: Past Surgical History:  Procedure Laterality Date   CARDIOVERSION N/A 03/14/2022   Procedure: CARDIOVERSION;  Surgeon: Hazle Lites, MD;  Location: [facility] ENDOSCOPY;  Service: Cardiovascular;  Laterality: N/A;   TEE WITHOUT CARDIOVERSION N/A 03/14/2022   Procedure: TRANSESOPHAGEAL ECHOCARDIOGRAM (TEE);  Surgeon: Hazle Lites, MD;  Location: [facility] ENDOSCOPY;  Service: Cardiovascular;  Laterality: N/A;    SOCIAL HISTORY: Social History  Socioeconomic History   Marital status: Married    Spouse name: Stana Ear   Number of children: 2   Years of education: Not on file   Highest education level: Not on file  Occupational History   Occupation: retired  Tobacco Use   Smoking status: Former    Current packs/day: 0.00    Types: Cigarettes    Start date: 10/01/1958    Quit date: 10/01/1978    Years since quitting: 45.3   Smokeless tobacco: Never   Tobacco comments:    Former smoker  04/03/22  Vaping Use   Vaping status: Never Used  Substance and Sexual Activity   Alcohol use: Not Currently    Alcohol/week: 2.0 standard drinks of alcohol    Types: 2 Glasses of wine per week    Comment: None in many years   Drug use: No   Sexual activity: Not on file  Other Topics Concern   Not on file  Social History Narrative   Not on file   Social Drivers of Health   Financial Resource Strain: Not on file  Food Insecurity: Not on file  Transportation Needs: Not on file  Physical Activity: Not on file  Stress: Not on file  Social Connections: Not on file  Intimate Partner Violence: Not on file    FAMILY HISTORY: Family History  Problem Relation Age of Onset   Kidney disease Mother    Prostate cancer Father    Kidney disease Brother    Colon cancer Neg Hx    Esophageal cancer Neg Hx    Liver cancer Neg Hx    Pancreatic cancer Neg Hx    Rectal cancer Neg Hx    Stomach cancer Neg Hx     ALLERGIES:  is allergic to zithromax  [azithromycin ].  MEDICATIONS:  Current Outpatient Medications  Medication Sig Dispense Refill   acyclovir  (ZOVIRAX ) 400 MG tablet TAKE 1 TABLET BY MOUTH TWICE A DAY 180 tablet 1   allopurinol  (ZYLOPRIM ) 300 MG tablet TAKE 1 TABLET BY MOUTH EVERY DAY 90 tablet 1   ALPRAZolam (XANAX) 0.25 MG tablet Take 0.25 mg by mouth 2 (two) times daily as needed for anxiety.     amiodarone  (PACERONE ) 200 MG tablet TAKE 1 TABLET BY MOUTH EVERY DAY 90 tablet 3   amLODipine  (NORVASC ) 5 MG tablet TAKE 1 TABLET (5 MG TOTAL) BY MOUTH DAILY. 90 tablet 3   amoxicillin-clavulanate (AUGMENTIN) 875-125 MG tablet Take 1 tablet by mouth 2 (two) times daily.     apixaban  (ELIQUIS ) 5 MG TABS tablet Take 1 tablet (5 mg total) by mouth 2 (two) times daily. 180 tablet 1   dexamethasone  (DECADRON ) 4 MG tablet TAKE 10 TABLETS (40 MG TOTAL) BY MOUTH ONCE A WEEK IN THE MORNING ON CHEMOTHERAPY DAYS 120 tablet 1   ferrous gluconate  (FERGON) 324 MG tablet Take 1 tablet (324 mg  total) by mouth 3 (three) times daily with meals. 90 tablet 0   fluticasone (FLONASE) 50 MCG/ACT nasal spray Place 2 sprays into the nose daily as needed for allergies.     gabapentin  (NEURONTIN ) 300 MG capsule TAKE 1 CAPSULE BY MOUTH TWICE A DAY 60 capsule 1   guaiFENesin  (MUCINEX ) 600 MG 12 hr tablet Take 600 mg by mouth 2 (two) times daily.     levothyroxine  (SYNTHROID ) 100 MCG tablet Take 100 mcg by mouth See admin instructions. Take 1 tablet (100 mcg) by mouth on Mondays, Tuesdays, Wednesdays, Thursdays, Fridays & Saturdays in the morning. Skip a dose on  Sundays.     loratadine (CLARITIN) 10 MG tablet Take 10 mg by mouth daily as needed for allergies.     meclizine  (ANTIVERT ) 25 MG tablet Take 1 tablet (25 mg total) by mouth 3 (three) times daily as needed for dizziness. 30 tablet 0   metoprolol  succinate (TOPROL -XL) 25 MG 24 hr tablet Take 1 tablet (25 mg total) by mouth daily. Take with or immediately following a meal. 30 tablet 3   Olmesartan-amLODIPine -HCTZ 20-5-12.5 MG TABS Take 1 tablet by mouth daily.     prochlorperazine  (COMPAZINE ) 10 MG tablet Take 1 tablet (10 mg total) by mouth every 6 (six) hours as needed for nausea or vomiting. 30 tablet 0   rosuvastatin (CRESTOR) 20 MG tablet 1 tablet Orally Once a day for 90 days     senna-docusate (SENOKOT-S) 8.6-50 MG tablet Take 1 tablet by mouth at bedtime as needed for mild constipation.     traMADol  (ULTRAM ) 50 MG tablet Take 1 tablet (50 mg total) by mouth every 6 (six) hours as needed. 30 tablet 0   vitamin B-12 (CYANOCOBALAMIN ) 500 MCG tablet Take 500 mcg by mouth in the morning.     Current Facility-Administered Medications  Medication Dose Route Frequency Provider Last Rate Last Admin   0.9 %  sodium chloride  infusion  500 mL Intravenous Once Armbruster, Lendon Queen, MD       0.9 %  sodium chloride  infusion  500 mL Intravenous Once Armbruster, Lendon Queen, MD        REVIEW OF SYSTEMS:   Constitutional: ( - ) fevers, ( - )  chills , (  - ) night sweats Eyes: ( - ) blurriness of vision, ( - ) double vision, ( - ) watery eyes Ears, nose, mouth, throat, and face: ( - ) mucositis, ( - ) sore throat Respiratory: ( - ) cough, ( - ) dyspnea, ( - ) wheezes Cardiovascular: ( - ) palpitation, ( - ) chest discomfort, (+ ) lower extremity swelling Gastrointestinal:  ( - ) nausea, ( - ) heartburn, ( - ) change in bowel habits Skin: ( - ) abnormal skin rashes Lymphatics: ( - ) new lymphadenopathy, ( - ) easy bruising Neurological: ( + ) numbness, ( - ) tingling, ( - ) new weaknesses Behavioral/Psych: ( - ) mood change, ( - ) new changes  All other systems were reviewed with the patient and are negative.  PHYSICAL EXAMINATION: ECOG PERFORMANCE STATUS: 1 - Symptomatic but completely ambulatory  Vitals:   02/08/24 0846  BP: 108/63  Pulse: 85  Resp: 16  Temp: 98.4 F (36.9 C)  SpO2: 100%      Filed Weights   02/08/24 0846  Weight: 244 lb 9.6 oz (110.9 kg)     GENERAL: Chronically ill-appearing elderly African-American male, alert, no distress and comfortable SKIN: skin color, texture, turgor are normal, no rashes or significant lesions EYES: conjunctiva are pink and non-injected, sclera clear LUNGS: clear to auscultation and percussion with normal breathing effort HEART: regular rate & rhythm and no murmurs and + 2 bilateral ankle edema, wearing compression stockings.  Musculoskeletal: no cyanosis of digits and no clubbing  PSYCH: alert & oriented x 3, fluent speech NEURO: no focal motor/sensory deficits  LABORATORY DATA:  I have reviewed the data as listed    Latest Ref Rng & Units 02/08/2024    8:21 AM 01/25/2024    8:21 AM 01/12/2024    8:19 AM  CBC  WBC 4.0 - 10.5 K/uL 5.1  4.5  5.5   Hemoglobin 13.0 - 17.0 g/dL 9.9  9.9  9.7   Hematocrit 39.0 - 52.0 % 30.7  31.0  29.8   Platelets 150 - 400 K/uL 203  187  206        Latest Ref Rng & Units 02/08/2024    8:21 AM 01/25/2024    8:21 AM 01/12/2024    8:19 AM  CMP   Glucose 70 - 99 mg/dL 409  91  96   BUN 8 - 23 mg/dL 24  22  18    Creatinine 0.61 - 1.24 mg/dL 8.11  9.14  7.82   Sodium 135 - 145 mmol/L 139  144  142   Potassium 3.5 - 5.1 mmol/L 4.2  4.1  4.1   Chloride 98 - 111 mmol/L 108  111  110   CO2 22 - 32 mmol/L 26  26  26    Calcium 8.9 - 10.3 mg/dL 9.0  8.8  9.0   Total Protein 6.5 - 8.1 g/dL 6.4  6.3  6.3   Total Bilirubin 0.0 - 1.2 mg/dL 0.5  0.5  0.5   Alkaline Phos 38 - 126 U/L 55  44  52   AST 15 - 41 U/L 25  19  22    ALT 0 - 44 U/L 22  16  17      Lab Results  Component Value Date   MPROTEIN Not Observed 01/12/2024   MPROTEIN Not Observed 12/14/2023   MPROTEIN Not Observed 11/17/2023   Lab Results  Component Value Date   KPAFRELGTCHN 14.4 01/12/2024   KPAFRELGTCHN 16.4 12/14/2023   KPAFRELGTCHN 14.5 11/17/2023   LAMBDASER 13.9 01/12/2024   LAMBDASER 15.9 12/14/2023   LAMBDASER 13.3 11/17/2023   KAPLAMBRATIO 1.04 01/12/2024   KAPLAMBRATIO 1.03 12/14/2023   KAPLAMBRATIO 1.09 11/17/2023    RADIOGRAPHIC STUDIES: No results found.  ASSESSMENT & PLAN Darren Hinton. Is a 78 y.o. male with medical history significant for IgA lambda multiple myeloma who presents for a follow up visit. .    # IgA lambda Multiple Myeloma -- Bone marrow biopsy performed on 01/16/2022 showed increased plasma cells consistent with a plasma cell neoplasm.  Normal FISH panel. --Patient meets diagnostic criteria based on kidney dysfunction and anemia. --Cycle 1 Day 1 of CyBorD chemotherapy started on 02/07/2022 --Velcade  maintenance therapy started on 09/09/2022.  Plan: --Most recent SPEP from 12/14/2023 showed M protein not detected and normal serum free light chains --Labs today show white blood cell 5.1, Hgb 9.9, MCV 101.7, Plt 203.  Creatinine overall stable at 2.50. Calcium level is normal. LFTs normal. Myeloma panel pending.  --continue on maintenance Velcade  every 2 weeks with monthly clinic visits.  # Leg Pain -- Likely a component of  neuropathy from his chemotherapy with pre-existing sciatica pain. -- continue gabapentin  to 300 mg twice daily -- continue tramadol  50 mg every 6 hours as needed -- patient connected with Dr. Mark Sil  #Supportive Care -- chemotherapy education complete -- port placement not required.  -- zofran  8mg  q8H PRN and compazine  10mg  PO q6H for nausea -- acyclovir  400mg  PO BID for VCZ prophylaxis --zometa to start after dental clearance.   No orders of the defined types were placed in this encounter.  All questions were answered. The patient knows to call the clinic with any problems, questions or concerns.  I have spent a total of 30 minutes minutes of face-to-face and non-face-to-face time, preparing to see the patient, performing a medically appropriate examination, counseling  and educating the patient, documenting clinical information in the electronic health record,  and care coordination.   Rogerio Clay, MD Department of Hematology/Oncology [facility] Phone: [phone] Pager: [phone] Email: [email or web address]   02/08/2024 9:06 AM

## 2024-02-08 ENCOUNTER — Inpatient Hospital Stay

## 2024-02-08 ENCOUNTER — Inpatient Hospital Stay: Attending: Hematology and Oncology | Admitting: Hematology and Oncology

## 2024-02-08 VITALS — BP 108/63 | HR 85 | Temp 98.4°F | Resp 16 | Wt 244.6 lb

## 2024-02-08 DIAGNOSIS — Z79624 Long term (current) use of inhibitors of nucleotide synthesis: Secondary | ICD-10-CM | POA: Diagnosis not present

## 2024-02-08 DIAGNOSIS — Z881 Allergy status to other antibiotic agents status: Secondary | ICD-10-CM | POA: Diagnosis not present

## 2024-02-08 DIAGNOSIS — I341 Nonrheumatic mitral (valve) prolapse: Secondary | ICD-10-CM | POA: Insufficient documentation

## 2024-02-08 DIAGNOSIS — I480 Paroxysmal atrial fibrillation: Secondary | ICD-10-CM | POA: Insufficient documentation

## 2024-02-08 DIAGNOSIS — D500 Iron deficiency anemia secondary to blood loss (chronic): Secondary | ICD-10-CM

## 2024-02-08 DIAGNOSIS — C9000 Multiple myeloma not having achieved remission: Secondary | ICD-10-CM

## 2024-02-08 DIAGNOSIS — Z7969 Long term (current) use of other immunomodulators and immunosuppressants: Secondary | ICD-10-CM | POA: Insufficient documentation

## 2024-02-08 DIAGNOSIS — D649 Anemia, unspecified: Secondary | ICD-10-CM | POA: Insufficient documentation

## 2024-02-08 DIAGNOSIS — Z87891 Personal history of nicotine dependence: Secondary | ICD-10-CM | POA: Diagnosis not present

## 2024-02-08 DIAGNOSIS — Z79899 Other long term (current) drug therapy: Secondary | ICD-10-CM | POA: Insufficient documentation

## 2024-02-08 DIAGNOSIS — Z7901 Long term (current) use of anticoagulants: Secondary | ICD-10-CM | POA: Diagnosis not present

## 2024-02-08 DIAGNOSIS — Z5112 Encounter for antineoplastic immunotherapy: Secondary | ICD-10-CM | POA: Diagnosis present

## 2024-02-08 DIAGNOSIS — Z860102 Personal history of hyperplastic colon polyps: Secondary | ICD-10-CM | POA: Diagnosis not present

## 2024-02-08 LAB — CBC WITH DIFFERENTIAL (CANCER CENTER ONLY)
Abs Immature Granulocytes: 0.02 10*3/uL (ref 0.00–0.07)
Basophils Absolute: 0 10*3/uL (ref 0.0–0.1)
Basophils Relative: 1 %
Eosinophils Absolute: 0.4 10*3/uL (ref 0.0–0.5)
Eosinophils Relative: 8 %
HCT: 30.7 % — ABNORMAL LOW (ref 39.0–52.0)
Hemoglobin: 9.9 g/dL — ABNORMAL LOW (ref 13.0–17.0)
Immature Granulocytes: 0 %
Lymphocytes Relative: 17 %
Lymphs Abs: 0.9 10*3/uL (ref 0.7–4.0)
MCH: 32.8 pg (ref 26.0–34.0)
MCHC: 32.2 g/dL (ref 30.0–36.0)
MCV: 101.7 fL — ABNORMAL HIGH (ref 80.0–100.0)
Monocytes Absolute: 0.6 10*3/uL (ref 0.1–1.0)
Monocytes Relative: 12 %
Neutro Abs: 3.1 10*3/uL (ref 1.7–7.7)
Neutrophils Relative %: 62 %
Platelet Count: 203 10*3/uL (ref 150–400)
RBC: 3.02 MIL/uL — ABNORMAL LOW (ref 4.22–5.81)
RDW: 15.9 % — ABNORMAL HIGH (ref 11.5–15.5)
WBC Count: 5.1 10*3/uL (ref 4.0–10.5)
nRBC: 0 % (ref 0.0–0.2)

## 2024-02-08 LAB — CMP (CANCER CENTER ONLY)
ALT: 22 U/L (ref 0–44)
AST: 25 U/L (ref 15–41)
Albumin: 4.3 g/dL (ref 3.5–5.0)
Alkaline Phosphatase: 55 U/L (ref 38–126)
Anion gap: 5 (ref 5–15)
BUN: 24 mg/dL — ABNORMAL HIGH (ref 8–23)
CO2: 26 mmol/L (ref 22–32)
Calcium: 9 mg/dL (ref 8.9–10.3)
Chloride: 108 mmol/L (ref 98–111)
Creatinine: 2.5 mg/dL — ABNORMAL HIGH (ref 0.61–1.24)
GFR, Estimated: 26 mL/min — ABNORMAL LOW (ref >60)
Glucose, Bld: 103 mg/dL — ABNORMAL HIGH (ref 70–99)
Potassium: 4.2 mmol/L (ref 3.5–5.1)
Sodium: 139 mmol/L (ref 135–145)
Total Bilirubin: 0.5 mg/dL (ref 0.0–1.2)
Total Protein: 6.4 g/dL — ABNORMAL LOW (ref 6.5–8.1)

## 2024-02-08 MED ORDER — PROCHLORPERAZINE MALEATE 10 MG PO TABS
10.0000 mg | ORAL_TABLET | Freq: Once | ORAL | Status: AC
  Administered 2024-02-08: 10 mg via ORAL
  Filled 2024-02-08: qty 1

## 2024-02-08 MED ORDER — BORTEZOMIB CHEMO SQ INJECTION 3.5 MG (2.5MG/ML)
1.3000 mg/m2 | Freq: Once | INTRAMUSCULAR | Status: AC
  Administered 2024-02-08: 3 mg via SUBCUTANEOUS
  Filled 2024-02-08: qty 1.2

## 2024-02-08 NOTE — Patient Instructions (Signed)
 [facility] - A DEPT OF [facility]  Discharge Instructions: Thank you for choosing [facility] to provide your oncology and hematology care.   If you have a lab appointment with the Cancer Center, please go directly to the Cancer Center and check in at the registration area.   Wear comfortable clothing and clothing appropriate for easy access to any Portacath or PICC line.   We strive to give you quality time with your provider. You may need to reschedule your appointment if you arrive late (15 or more minutes).  Arriving late affects you and other patients whose appointments are after yours.  Also, if you miss three or more appointments without notifying the office, you may be dismissed from the clinic at the provider's discretion.      For prescription refill requests, have your pharmacy contact our office and allow 72 hours for refills to be completed.    Today you received the following chemotherapy and/or immunotherapy agent:Bortezomib (Velcade)     To help prevent nausea and vomiting after your treatment, we encourage you to take your nausea medication as directed.  BELOW ARE SYMPTOMS THAT SHOULD BE REPORTED IMMEDIATELY: *FEVER GREATER THAN 100.4 F (38 C) OR HIGHER *CHILLS OR SWEATING *NAUSEA AND VOMITING THAT IS NOT CONTROLLED WITH YOUR NAUSEA MEDICATION *UNUSUAL SHORTNESS OF BREATH *UNUSUAL BRUISING OR BLEEDING *URINARY PROBLEMS (pain or burning when urinating, or frequent urination) *BOWEL PROBLEMS (unusual diarrhea, constipation, pain near the anus) TENDERNESS IN MOUTH AND THROAT WITH OR WITHOUT PRESENCE OF ULCERS (sore throat, sores in mouth, or a toothache) UNUSUAL RASH, SWELLING OR PAIN  UNUSUAL VAGINAL DISCHARGE OR ITCHING   Items with * indicate a potential emergency and should be followed up as soon as possible or go to the Emergency Department if any problems should occur.  Please show the CHEMOTHERAPY ALERT CARD or  IMMUNOTHERAPY ALERT CARD at check-in to the Emergency Department and triage nurse.  Should you have questions after your visit or need to cancel or reschedule your appointment, please contact [facility] - A DEPT OF [facility]  Dept: [phone]  and follow the prompts.  Office hours are 8:00 a.m. to 4:30 p.m. Monday - Friday. Please note that voicemails left after 4:00 p.m. may not be returned until the following business day.  We are closed weekends and major holidays. You have access to a nurse at all times for urgent questions. Please call the main number to the clinic Dept: [phone] and follow the prompts.   For any non-urgent questions, you may also contact your provider using MyChart. We now offer e-Visits for anyone 67 and older to request care online for non-urgent symptoms. For details visit [email or web address].   Also download the MyChart app! Go to the app store, search "MyChart", open the app, select [facility], and log in with your MyChart username and password.  Bortezomib Injection What is this medication? BORTEZOMIB (bor TEZ oh mib) treats lymphoma. It may also be used to treat multiple myeloma, a type of bone marrow cancer. It works by blocking a protein that causes cancer cells to grow and multiply. This helps to slow or stop the spread of cancer cells. This medicine may be used for other purposes; ask your health care provider or pharmacist if you have questions. COMMON BRAND NAME(S): BORUZU, Velcade What should I tell my care team before I take this medication? They need to know  if you have any of these conditions: Dehydration Diabetes Heart disease Liver disease Tingling of the fingers or toes or other nerve disorder An unusual or allergic reaction to bortezomib, other medications, foods, dyes, or preservatives If you or your partner are pregnant or trying to get pregnant Breastfeeding How should I use this medication? This  medication is injected into a vein or under the skin. It is given by your care team in a hospital or clinic setting. Talk to your care team about the use of this medication in children. Special care may be needed. Overdosage: If you think you have taken too much of this medicine contact a poison control center or emergency room at once. NOTE: This medicine is only for you. Do not share this medicine with others. What if I miss a dose? Keep appointments for follow-up doses. It is important not to miss your dose. Call your care team if you are unable to keep an appointment. What may interact with this medication? Ketoconazole Rifampin This list may not describe all possible interactions. Give your health care provider a list of all the medicines, herbs, non-prescription drugs, or dietary supplements you use. Also tell them if you smoke, drink alcohol, or use illegal drugs. Some items may interact with your medicine. What should I watch for while using this medication? Your condition will be monitored carefully while you are receiving this medication. You may need blood work while taking this medication. This medication may affect your coordination, reaction time, or judgment. Do not drive or operate machinery until you know how this medication affects you. Sit up or stand slowly to reduce the risk of dizzy or fainting spells. Drinking alcohol with this medication can increase the risk of these side effects. This medication may increase your risk of getting an infection. Call your care team for advice if you get a fever, chills, sore throat, or other symptoms of a cold or flu. Do not treat yourself. Try to avoid being around people who are sick. Check with your care team if you have severe diarrhea, nausea, and vomiting, or if you sweat a lot. The loss of too much body fluid may make it dangerous for you to take this medication. Talk to your care team if you may be pregnant. Serious birth defects can  occur if you take this medication during pregnancy and for 7 months after the last dose. You will need a negative pregnancy test before starting this medication. Contraception is recommended while taking this medication and for 7 months after the last dose. Your care team can help you find the option that works for you. If your partner can get pregnant, use a condom during sex while taking this medication and for 4 months after the last dose. Do not breastfeed while taking this medication and for 2 months after the last dose. This medication may cause infertility. Talk to your care team if you are concerned about your fertility. What side effects may I notice from receiving this medication? Side effects that you should report to your care team as soon as possible: Allergic reactions--skin rash, itching, hives, swelling of the face, lips, tongue, or throat Bleeding--bloody or black, tar-like stools, vomiting blood or brown material that looks like coffee grounds, red or dark brown urine, small red or purple spots on skin, unusual bruising or bleeding Bleeding in the brain--severe headache, stiff neck, confusion, dizziness, change in vision, numbness or weakness of the face, arm, or leg, trouble speaking, trouble walking, vomiting  Bowel blockage--stomach cramping, unable to have a bowel movement or pass gas, loss of appetite, vomiting Heart failure--shortness of breath, swelling of the ankles, feet, or hands, sudden weight gain, unusual weakness or fatigue Infection--fever, chills, cough, sore throat, wounds that don't heal, pain or trouble when passing urine, general feeling of discomfort or being unwell Liver injury--right upper belly pain, loss of appetite, nausea, light-colored stool, dark yellow or brown urine, yellowing skin or eyes, unusual weakness or fatigue Low blood pressure--dizziness, feeling faint or lightheaded, blurry vision Lung injury--shortness of breath or trouble breathing, cough,  spitting up blood, chest pain, fever Pain, tingling, or numbness in the hands or feet Severe or prolonged diarrhea Stomach pain, bloody diarrhea, pale skin, unusual weakness or fatigue, decrease in the amount of urine, which may be signs of hemolytic uremic syndrome Sudden and severe headache, confusion, change in vision, seizures, which may be signs of posterior reversible encephalopathy syndrome (PRES) TTP--purple spots on the skin or inside the mouth, pale skin, yellowing skin or eyes, unusual weakness or fatigue, fever, fast or irregular heartbeat, confusion, change in vision, trouble speaking, trouble walking Tumor lysis syndrome (TLS)--nausea, vomiting, diarrhea, decrease in the amount of urine, dark urine, unusual weakness or fatigue, confusion, muscle pain or cramps, fast or irregular heartbeat, joint pain Side effects that usually do not require medical attention (report to your care team if they continue or are bothersome): Constipation Diarrhea Fatigue Loss of appetite Nausea This list may not describe all possible side effects. Call your doctor for medical advice about side effects. You may report side effects to FDA at 1-800-FDA-1088. Where should I keep my medication? This medication is given in a hospital or clinic. It will not be stored at home. NOTE: This sheet is a summary. It may not cover all possible information. If you have questions about this medicine, talk to your doctor, pharmacist, or health care provider.  2024 Elsevier/Gold Standard (2022-01-28 00:00:00)

## 2024-02-09 LAB — KAPPA/LAMBDA LIGHT CHAINS
Kappa free light chain: 14.4 mg/L (ref 3.3–19.4)
Kappa, lambda light chain ratio: 0.99 (ref 0.26–1.65)
Lambda free light chains: 14.5 mg/L (ref 5.7–26.3)

## 2024-02-10 LAB — MULTIPLE MYELOMA PANEL, SERUM
Albumin SerPl Elph-Mcnc: 3.9 g/dL (ref 2.9–4.4)
Albumin/Glob SerPl: 1.9 — ABNORMAL HIGH (ref 0.7–1.7)
Alpha 1: 0.2 g/dL (ref 0.0–0.4)
Alpha2 Glob SerPl Elph-Mcnc: 0.6 g/dL (ref 0.4–1.0)
B-Globulin SerPl Elph-Mcnc: 0.9 g/dL (ref 0.7–1.3)
Gamma Glob SerPl Elph-Mcnc: 0.3 g/dL — ABNORMAL LOW (ref 0.4–1.8)
Globulin, Total: 2.1 g/dL — ABNORMAL LOW (ref 2.2–3.9)
IgA: 46 mg/dL — ABNORMAL LOW (ref 61–437)
IgG (Immunoglobin G), Serum: 456 mg/dL — ABNORMAL LOW (ref 603–1613)
IgM (Immunoglobulin M), Srm: 52 mg/dL (ref 15–143)
Total Protein ELP: 6 g/dL (ref 6.0–8.5)

## 2024-02-17 ENCOUNTER — Encounter: Payer: Self-pay | Admitting: Cardiovascular Disease

## 2024-02-22 ENCOUNTER — Other Ambulatory Visit

## 2024-02-22 ENCOUNTER — Ambulatory Visit

## 2024-02-29 ENCOUNTER — Inpatient Hospital Stay

## 2024-02-29 ENCOUNTER — Telehealth: Payer: Self-pay | Admitting: *Deleted

## 2024-02-29 VITALS — BP 136/62 | HR 51 | Temp 98.0°F | Resp 20 | Wt 243.5 lb

## 2024-02-29 DIAGNOSIS — C9000 Multiple myeloma not having achieved remission: Secondary | ICD-10-CM

## 2024-02-29 DIAGNOSIS — Z5112 Encounter for antineoplastic immunotherapy: Secondary | ICD-10-CM | POA: Diagnosis not present

## 2024-02-29 LAB — CBC WITH DIFFERENTIAL (CANCER CENTER ONLY)
Abs Immature Granulocytes: 0.01 10*3/uL (ref 0.00–0.07)
Basophils Absolute: 0 10*3/uL (ref 0.0–0.1)
Basophils Relative: 1 %
Eosinophils Absolute: 0.2 10*3/uL (ref 0.0–0.5)
Eosinophils Relative: 4 %
HCT: 31.5 % — ABNORMAL LOW (ref 39.0–52.0)
Hemoglobin: 10.3 g/dL — ABNORMAL LOW (ref 13.0–17.0)
Immature Granulocytes: 0 %
Lymphocytes Relative: 25 %
Lymphs Abs: 1.1 10*3/uL (ref 0.7–4.0)
MCH: 33.1 pg (ref 26.0–34.0)
MCHC: 32.7 g/dL (ref 30.0–36.0)
MCV: 101.3 fL — ABNORMAL HIGH (ref 80.0–100.0)
Monocytes Absolute: 0.8 10*3/uL (ref 0.1–1.0)
Monocytes Relative: 17 %
Neutro Abs: 2.4 10*3/uL (ref 1.7–7.7)
Neutrophils Relative %: 53 %
Platelet Count: 208 10*3/uL (ref 150–400)
RBC: 3.11 MIL/uL — ABNORMAL LOW (ref 4.22–5.81)
RDW: 15.4 % (ref 11.5–15.5)
WBC Count: 4.6 10*3/uL (ref 4.0–10.5)
nRBC: 0 % (ref 0.0–0.2)

## 2024-02-29 LAB — CMP (CANCER CENTER ONLY)
ALT: 27 U/L (ref 0–44)
AST: 31 U/L (ref 15–41)
Albumin: 4.3 g/dL (ref 3.5–5.0)
Alkaline Phosphatase: 62 U/L (ref 38–126)
Anion gap: 8 (ref 5–15)
BUN: 23 mg/dL (ref 8–23)
CO2: 25 mmol/L (ref 22–32)
Calcium: 9.4 mg/dL (ref 8.9–10.3)
Chloride: 108 mmol/L (ref 98–111)
Creatinine: 2.61 mg/dL — ABNORMAL HIGH (ref 0.61–1.24)
GFR, Estimated: 24 mL/min — ABNORMAL LOW (ref >60)
Glucose, Bld: 96 mg/dL (ref 70–99)
Potassium: 4.3 mmol/L (ref 3.5–5.1)
Sodium: 141 mmol/L (ref 135–145)
Total Bilirubin: 0.5 mg/dL (ref 0.0–1.2)
Total Protein: 6.7 g/dL (ref 6.5–8.1)

## 2024-02-29 MED ORDER — BORTEZOMIB CHEMO SQ INJECTION 3.5 MG (2.5MG/ML)
1.3000 mg/m2 | Freq: Once | INTRAMUSCULAR | Status: AC
  Administered 2024-02-29: 3 mg via SUBCUTANEOUS
  Filled 2024-02-29: qty 1.2

## 2024-02-29 MED ORDER — PROCHLORPERAZINE MALEATE 10 MG PO TABS
10.0000 mg | ORAL_TABLET | Freq: Once | ORAL | Status: AC
  Administered 2024-02-29: 10 mg via ORAL
  Filled 2024-02-29: qty 1

## 2024-02-29 NOTE — Telephone Encounter (Signed)
 Received call from pt's wife, Darren Hinton. She states she and Darren Hinton are going to [place]  to visit their son and would be missing one of his treatments. She wanted to make sure it would be ok to miss one treatment. Discussed with Dr. Federico and he states it is fine for Darren Hinton to go on a vacation. Darren Hinton that they can go and tha it will not be a detriment to Darren Hinton to miss a treatment. She is very happy about this and they are looking forward to going to visit their son. Appt on 03/14/24 has been cancelled.

## 2024-02-29 NOTE — Patient Instructions (Signed)
 [facility] - A DEPT OF [facility]  Discharge Instructions: Thank you for choosing [facility] to provide your oncology and hematology care.   If you have a lab appointment with the Cancer Center, please go directly to the Cancer Center and check in at the registration area.   Wear comfortable clothing and clothing appropriate for easy access to any Portacath or PICC line.   We strive to give you quality time with your provider. You may need to reschedule your appointment if you arrive late (15 or more minutes).  Arriving late affects you and other patients whose appointments are after yours.  Also, if you miss three or more appointments without notifying the office, you may be dismissed from the clinic at the provider's discretion.      For prescription refill requests, have your pharmacy contact our office and allow 72 hours for refills to be completed.    Today you received the following chemotherapy and/or immunotherapy agents: bortezomib  SQ    To help prevent nausea and vomiting after your treatment, we encourage you to take your nausea medication as directed.  BELOW ARE SYMPTOMS THAT SHOULD BE REPORTED IMMEDIATELY: *FEVER GREATER THAN 100.4 F (38 C) OR HIGHER *CHILLS OR SWEATING *NAUSEA AND VOMITING THAT IS NOT CONTROLLED WITH YOUR NAUSEA MEDICATION *UNUSUAL SHORTNESS OF BREATH *UNUSUAL BRUISING OR BLEEDING *URINARY PROBLEMS (pain or burning when urinating, or frequent urination) *BOWEL PROBLEMS (unusual diarrhea, constipation, pain near the anus) TENDERNESS IN MOUTH AND THROAT WITH OR WITHOUT PRESENCE OF ULCERS (sore throat, sores in mouth, or a toothache) UNUSUAL RASH, SWELLING OR PAIN  UNUSUAL VAGINAL DISCHARGE OR ITCHING   Items with * indicate a potential emergency and should be followed up as soon as possible or go to the Emergency Department if any problems should occur.  Please show the CHEMOTHERAPY ALERT CARD or  IMMUNOTHERAPY ALERT CARD at check-in to the Emergency Department and triage nurse.  Should you have questions after your visit or need to cancel or reschedule your appointment, please contact [facility] - A DEPT OF [facility]  Dept: [phone]  and follow the prompts.  Office hours are 8:00 a.m. to 4:30 p.m. Monday - Friday. Please note that voicemails left after 4:00 p.m. may not be returned until the following business day.  We are closed weekends and major holidays. You have access to a nurse at all times for urgent questions. Please call the main number to the clinic Dept: [phone] and follow the prompts.   For any non-urgent questions, you may also contact your provider using MyChart. We now offer e-Visits for anyone 70 and older to request care online for non-urgent symptoms. For details visit [email or web address].   Also download the MyChart app! Go to the app store, search MyChart, open the app, select [place], and log in with your MyChart username and password.

## 2024-03-07 ENCOUNTER — Other Ambulatory Visit

## 2024-03-07 ENCOUNTER — Ambulatory Visit: Admitting: Hematology and Oncology

## 2024-03-07 ENCOUNTER — Ambulatory Visit

## 2024-03-14 ENCOUNTER — Ambulatory Visit: Admitting: Hematology and Oncology

## 2024-03-14 ENCOUNTER — Ambulatory Visit

## 2024-03-14 ENCOUNTER — Other Ambulatory Visit

## 2024-03-18 ENCOUNTER — Other Ambulatory Visit: Payer: Self-pay

## 2024-03-21 ENCOUNTER — Other Ambulatory Visit

## 2024-03-21 ENCOUNTER — Ambulatory Visit

## 2024-03-28 ENCOUNTER — Inpatient Hospital Stay: Attending: Hematology and Oncology

## 2024-03-28 ENCOUNTER — Inpatient Hospital Stay

## 2024-03-28 ENCOUNTER — Other Ambulatory Visit: Payer: Self-pay | Admitting: Hematology and Oncology

## 2024-03-28 VITALS — BP 108/61 | HR 41 | Temp 98.3°F | Resp 16 | Ht 77.0 in | Wt 242.8 lb

## 2024-03-28 DIAGNOSIS — Z5112 Encounter for antineoplastic immunotherapy: Secondary | ICD-10-CM | POA: Insufficient documentation

## 2024-03-28 DIAGNOSIS — Z79899 Other long term (current) drug therapy: Secondary | ICD-10-CM | POA: Insufficient documentation

## 2024-03-28 DIAGNOSIS — C9000 Multiple myeloma not having achieved remission: Secondary | ICD-10-CM | POA: Diagnosis present

## 2024-03-28 LAB — CBC WITH DIFFERENTIAL (CANCER CENTER ONLY)
Abs Immature Granulocytes: 0 K/uL (ref 0.00–0.07)
Basophils Absolute: 0 K/uL (ref 0.0–0.1)
Basophils Relative: 1 %
Eosinophils Absolute: 0.4 K/uL (ref 0.0–0.5)
Eosinophils Relative: 10 %
HCT: 32.7 % — ABNORMAL LOW (ref 39.0–52.0)
Hemoglobin: 10.4 g/dL — ABNORMAL LOW (ref 13.0–17.0)
Immature Granulocytes: 0 %
Lymphocytes Relative: 26 %
Lymphs Abs: 1.1 K/uL (ref 0.7–4.0)
MCH: 32.4 pg (ref 26.0–34.0)
MCHC: 31.8 g/dL (ref 30.0–36.0)
MCV: 101.9 fL — ABNORMAL HIGH (ref 80.0–100.0)
Monocytes Absolute: 0.7 K/uL (ref 0.1–1.0)
Monocytes Relative: 17 %
Neutro Abs: 2 K/uL (ref 1.7–7.7)
Neutrophils Relative %: 46 %
Platelet Count: 242 K/uL (ref 150–400)
RBC: 3.21 MIL/uL — ABNORMAL LOW (ref 4.22–5.81)
RDW: 14.6 % (ref 11.5–15.5)
WBC Count: 4.3 K/uL (ref 4.0–10.5)
nRBC: 0 % (ref 0.0–0.2)

## 2024-03-28 LAB — CMP (CANCER CENTER ONLY)
ALT: 30 U/L (ref 0–44)
AST: 31 U/L (ref 15–41)
Albumin: 4.2 g/dL (ref 3.5–5.0)
Alkaline Phosphatase: 61 U/L (ref 38–126)
Anion gap: 8 (ref 5–15)
BUN: 28 mg/dL — ABNORMAL HIGH (ref 8–23)
CO2: 25 mmol/L (ref 22–32)
Calcium: 9.2 mg/dL (ref 8.9–10.3)
Chloride: 109 mmol/L (ref 98–111)
Creatinine: 3.69 mg/dL — ABNORMAL HIGH (ref 0.61–1.24)
GFR, Estimated: 16 mL/min — ABNORMAL LOW (ref >60)
Glucose, Bld: 98 mg/dL (ref 70–99)
Potassium: 4 mmol/L (ref 3.5–5.1)
Sodium: 142 mmol/L (ref 135–145)
Total Bilirubin: 0.4 mg/dL (ref 0.0–1.2)
Total Protein: 6.7 g/dL (ref 6.5–8.1)

## 2024-03-28 MED ORDER — PROCHLORPERAZINE MALEATE 10 MG PO TABS
10.0000 mg | ORAL_TABLET | Freq: Once | ORAL | Status: AC
Start: 2024-03-28 — End: 2024-03-28
  Administered 2024-03-28: 10 mg via ORAL
  Filled 2024-03-28: qty 1

## 2024-03-28 MED ORDER — BORTEZOMIB CHEMO SQ INJECTION 3.5 MG (2.5MG/ML)
1.3000 mg/m2 | Freq: Once | INTRAMUSCULAR | Status: AC
  Administered 2024-03-28: 3 mg via SUBCUTANEOUS
  Filled 2024-03-28: qty 1.2

## 2024-03-28 NOTE — Patient Instructions (Signed)
 [facility] - A DEPT OF [facility]  Discharge Instructions: Thank you for choosing [facility] to provide your oncology and hematology care.   If you have a lab appointment with the Cancer Center, please go directly to the Cancer Center and check in at the registration area.   Wear comfortable clothing and clothing appropriate for easy access to any Portacath or PICC line.   We strive to give you quality time with your provider. You may need to reschedule your appointment if you arrive late (15 or more minutes).  Arriving late affects you and other patients whose appointments are after yours.  Also, if you miss three or more appointments without notifying the office, you may be dismissed from the clinic at the provider's discretion.      For prescription refill requests, have your pharmacy contact our office and allow 72 hours for refills to be completed.    Today you received the following chemotherapy and/or immunotherapy agent: Bortezomib (Velcade)      To help prevent nausea and vomiting after your treatment, we encourage you to take your nausea medication as directed.  BELOW ARE SYMPTOMS THAT SHOULD BE REPORTED IMMEDIATELY: *FEVER GREATER THAN 100.4 F (38 C) OR HIGHER *CHILLS OR SWEATING *NAUSEA AND VOMITING THAT IS NOT CONTROLLED WITH YOUR NAUSEA MEDICATION *UNUSUAL SHORTNESS OF BREATH *UNUSUAL BRUISING OR BLEEDING *URINARY PROBLEMS (pain or burning when urinating, or frequent urination) *BOWEL PROBLEMS (unusual diarrhea, constipation, pain near the anus) TENDERNESS IN MOUTH AND THROAT WITH OR WITHOUT PRESENCE OF ULCERS (sore throat, sores in mouth, or a toothache) UNUSUAL RASH, SWELLING OR PAIN  UNUSUAL VAGINAL DISCHARGE OR ITCHING   Items with * indicate a potential emergency and should be followed up as soon as possible or go to the Emergency Department if any problems should occur.  Please show the CHEMOTHERAPY ALERT CARD or  IMMUNOTHERAPY ALERT CARD at check-in to the Emergency Department and triage nurse.  Should you have questions after your visit or need to cancel or reschedule your appointment, please contact [facility] - [facility] DEPT [facility]  Dept: [phone]  and follow the prompts.  Office hours are 8:00 a.m. to 4:30 p.m. Monday - Friday. Please note that voicemails left after 4:00 p.m. may not be returned until the following business day.  We are closed weekends and major holidays. You have access to a nurse at all times for urgent questions. Please call the main number to the clinic Dept: [phone] and follow the prompts.   For any non-urgent questions, you may also contact your provider using MyChart. We now offer e-Visits for anyone 55 and older to request care online for non-urgent symptoms. For details visit [email or web address].   Also download the MyChart app! Go to the app store, search "MyChart", open the app, select [place], and log in with your MyChart username and password.

## 2024-03-28 NOTE — Progress Notes (Signed)
 Pt. heart rate 41-42 bpm, pt. denies chest pain, dizziness, and no SOB noted. Pt. states he took his Metoprolol  25 mg po this AM with his other home medications. Per Dr. Federico, ok to discharge pt. home.

## 2024-03-29 LAB — KAPPA/LAMBDA LIGHT CHAINS
Kappa free light chain: 20.6 mg/L — ABNORMAL HIGH (ref 3.3–19.4)
Kappa, lambda light chain ratio: 0.92 (ref 0.26–1.65)
Lambda free light chains: 22.3 mg/L (ref 5.7–26.3)

## 2024-03-30 ENCOUNTER — Other Ambulatory Visit: Payer: Self-pay

## 2024-03-30 LAB — MULTIPLE MYELOMA PANEL, SERUM
Albumin SerPl Elph-Mcnc: 4 g/dL (ref 2.9–4.4)
Albumin/Glob SerPl: 1.9 — ABNORMAL HIGH (ref 0.7–1.7)
Alpha 1: 0.2 g/dL (ref 0.0–0.4)
Alpha2 Glob SerPl Elph-Mcnc: 0.7 g/dL (ref 0.4–1.0)
B-Globulin SerPl Elph-Mcnc: 0.9 g/dL (ref 0.7–1.3)
Gamma Glob SerPl Elph-Mcnc: 0.5 g/dL (ref 0.4–1.8)
Globulin, Total: 2.2 g/dL (ref 2.2–3.9)
IgA: 74 mg/dL (ref 61–437)
IgG (Immunoglobin G), Serum: 539 mg/dL — ABNORMAL LOW (ref 603–1613)
IgM (Immunoglobulin M), Srm: 61 mg/dL (ref 15–143)
Total Protein ELP: 6.2 g/dL (ref 6.0–8.5)

## 2024-04-04 ENCOUNTER — Ambulatory Visit

## 2024-04-04 ENCOUNTER — Other Ambulatory Visit

## 2024-04-04 ENCOUNTER — Ambulatory Visit: Admitting: Hematology and Oncology

## 2024-04-06 ENCOUNTER — Other Ambulatory Visit: Payer: Self-pay

## 2024-04-06 ENCOUNTER — Emergency Department (HOSPITAL_COMMUNITY)
Admission: EM | Admit: 2024-04-06 | Discharge: 2024-04-06 | Disposition: A | Discharge status: Home / Self Care | Source: Ambulatory Visit | Attending: Emergency Medicine | Admitting: Emergency Medicine

## 2024-04-06 ENCOUNTER — Encounter: Payer: Self-pay | Admitting: Hematology and Oncology

## 2024-04-06 ENCOUNTER — Emergency Department (HOSPITAL_COMMUNITY)

## 2024-04-06 ENCOUNTER — Encounter (HOSPITAL_COMMUNITY): Payer: Self-pay | Admitting: Emergency Medicine

## 2024-04-06 DIAGNOSIS — R531 Weakness: Secondary | ICD-10-CM | POA: Insufficient documentation

## 2024-04-06 DIAGNOSIS — U071 COVID-19: Secondary | ICD-10-CM | POA: Insufficient documentation

## 2024-04-06 DIAGNOSIS — N189 Chronic kidney disease, unspecified: Secondary | ICD-10-CM | POA: Insufficient documentation

## 2024-04-06 DIAGNOSIS — Z8579 Personal history of other malignant neoplasms of lymphoid, hematopoietic and related tissues: Secondary | ICD-10-CM | POA: Insufficient documentation

## 2024-04-06 DIAGNOSIS — R059 Cough, unspecified: Secondary | ICD-10-CM | POA: Insufficient documentation

## 2024-04-06 DIAGNOSIS — R7881 Bacteremia: Secondary | ICD-10-CM | POA: Diagnosis not present

## 2024-04-06 LAB — URINALYSIS, W/ REFLEX TO CULTURE (INFECTION SUSPECTED)
Bacteria, UA: NONE SEEN
Bilirubin Urine: NEGATIVE
Glucose, UA: NEGATIVE mg/dL
Ketones, ur: NEGATIVE mg/dL
Leukocytes,Ua: NEGATIVE
Nitrite: NEGATIVE
Protein, ur: 30 mg/dL — AB
Specific Gravity, Urine: 1.017 (ref 1.005–1.030)
pH: 5 (ref 5.0–8.0)

## 2024-04-06 LAB — COMPREHENSIVE METABOLIC PANEL WITH GFR
ALT: 29 U/L (ref 0–44)
AST: 30 U/L (ref 15–41)
Albumin: 3.8 g/dL (ref 3.5–5.0)
Alkaline Phosphatase: 52 U/L (ref 38–126)
Anion gap: 9 (ref 5–15)
BUN: 26 mg/dL — ABNORMAL HIGH (ref 8–23)
CO2: 21 mmol/L — ABNORMAL LOW (ref 22–32)
Calcium: 8.9 mg/dL (ref 8.9–10.3)
Chloride: 104 mmol/L (ref 98–111)
Creatinine, Ser: 3.19 mg/dL — ABNORMAL HIGH (ref 0.61–1.24)
GFR, Estimated: 19 mL/min — ABNORMAL LOW (ref >60)
Glucose, Bld: 94 mg/dL (ref 70–99)
Potassium: 4.2 mmol/L (ref 3.5–5.1)
Sodium: 134 mmol/L — ABNORMAL LOW (ref 135–145)
Total Bilirubin: 0.8 mg/dL (ref 0.0–1.2)
Total Protein: 6.8 g/dL (ref 6.5–8.1)

## 2024-04-06 LAB — CBC WITH DIFFERENTIAL/PLATELET
Abs Immature Granulocytes: 0.06 K/uL (ref 0.00–0.07)
Basophils Absolute: 0 K/uL (ref 0.0–0.1)
Basophils Relative: 0 %
Eosinophils Absolute: 0.1 K/uL (ref 0.0–0.5)
Eosinophils Relative: 1 %
HCT: 30.7 % — ABNORMAL LOW (ref 39.0–52.0)
Hemoglobin: 9.7 g/dL — ABNORMAL LOW (ref 13.0–17.0)
Immature Granulocytes: 1 %
Lymphocytes Relative: 7 %
Lymphs Abs: 0.6 K/uL — ABNORMAL LOW (ref 0.7–4.0)
MCH: 33.3 pg (ref 26.0–34.0)
MCHC: 31.6 g/dL (ref 30.0–36.0)
MCV: 105.5 fL — ABNORMAL HIGH (ref 80.0–100.0)
Monocytes Absolute: 1.2 K/uL — ABNORMAL HIGH (ref 0.1–1.0)
Monocytes Relative: 15 %
Neutro Abs: 5.8 K/uL (ref 1.7–7.7)
Neutrophils Relative %: 76 %
Platelets: 115 K/uL — ABNORMAL LOW (ref 150–400)
RBC: 2.91 MIL/uL — ABNORMAL LOW (ref 4.22–5.81)
RDW: 15.2 % (ref 11.5–15.5)
WBC: 7.7 K/uL (ref 4.0–10.5)
nRBC: 0 % (ref 0.0–0.2)

## 2024-04-06 LAB — RESP PANEL BY RT-PCR (RSV, FLU A&B, COVID)  RVPGX2
Influenza A by PCR: NEGATIVE
Influenza B by PCR: NEGATIVE
Resp Syncytial Virus by PCR: NEGATIVE
SARS Coronavirus 2 by RT PCR: POSITIVE — AB

## 2024-04-06 LAB — I-STAT CG4 LACTIC ACID, ED: Lactic Acid, Venous: 1.2 mmol/L (ref 0.5–1.9)

## 2024-04-06 MED ORDER — MOLNUPIRAVIR EUA 200MG CAPSULE: 4.0000 | ORAL_CAPSULE | Freq: Two times a day (BID) | ORAL | 0 refills | Status: DC

## 2024-04-06 MED ORDER — ACETAMINOPHEN 325 MG PO TABS
650.0000 mg | ORAL_TABLET | Freq: Once | ORAL | Status: AC
  Administered 2024-04-06: 650 mg via ORAL
  Filled 2024-04-06: qty 2

## 2024-04-06 MED ORDER — BENZONATATE 100 MG PO CAPS: 100.0000 mg | ORAL_CAPSULE | Freq: Three times a day (TID) | ORAL | 0 refills | Status: DC

## 2024-04-06 NOTE — ED Triage Notes (Signed)
 Patient c/o weakness x 2 days. Patient report PCP recommeded to be seen in ED for bilateral Pneumonia. Patient denies n/v. Hx multiple myeloma , last treatment was 7/21.

## 2024-04-06 NOTE — ED Provider Notes (Signed)
 [place] EMERGENCY DEPARTMENT AT [facility] Provider Note   CSN: [id number] Arrival date & time: 04/06/24  1646     Patient presents with: Weakness   Darren Hinton. is a 78 y.o. male with PMH of multiple myeloma without remission, atrial fibrillation with RVR, chemotherapy induced neuropathy presents after having a cough and weakness for the last three days. Patient is hard of hearing and wife was able to give most of the history.  Patient's wife reports that patient complained of scratchy throat starting on Sunday when they were out to eat with family.  Patient's wife then reports that he started coughing and had some productive sputum as well.  He then had an episode yesterday where he was trying to get dressed and felt weak when he was bending and started sliding off of his chair.  Patient presented to his general medicine doctor today and they tested him for COVID for which he was positive.  They also did a chest x-ray and noted pneumonia on this x-ray.  Unable to see back chest x-ray at this time in the hospital.  Patient was advised the hospital for antibiotics.  Patient denied any fever or chills, although he was febrile to temperature 101 in the hospital. He also denies any night sweats or intentional weight loss.  He did endorse cough with sputum production but no hemoptysis.  He does endorse some occasional dysuria but denies any urinary frequency.  He denies any diarrhea or constipation.    Weakness      Prior to Admission medications   Medication Sig Start Date End Date Taking? Authorizing Provider  acyclovir  (ZOVIRAX ) 400 MG tablet TAKE 1 TABLET BY MOUTH TWICE A DAY 01/05/24   Federico Norleen ONEIDA MADISON, MD  allopurinol  (ZYLOPRIM ) 300 MG tablet TAKE 1 TABLET BY MOUTH EVERY DAY 01/05/24   Dorsey, John T IV, MD  ALPRAZolam (XANAX) 0.25 MG tablet Take 0.25 mg by mouth 2 (two) times daily as needed for anxiety.    [name], MD  amiodarone  (PACERONE ) 200 MG tablet  TAKE 1 TABLET BY MOUTH EVERY DAY 05/15/23   Croitoru, Mihai, MD  amLODipine  (NORVASC ) 5 MG tablet TAKE 1 TABLET (5 MG TOTAL) BY MOUTH DAILY. 05/15/23   Dorsey, John T IV, MD  amoxicillin-clavulanate (AUGMENTIN) 875-125 MG tablet Take 1 tablet by mouth 2 (two) times daily. 10/15/23   [provider]  apixaban  (ELIQUIS ) 5 MG TABS tablet Take 1 tablet (5 mg total) by mouth 2 (two) times daily. 07/18/22 03/30/23  Croitoru, Mihai, MD  dexamethasone  (DECADRON ) 4 MG tablet TAKE 10 TABLETS (40 MG TOTAL) BY MOUTH ONCE A WEEK IN THE MORNING ON CHEMOTHERAPY DAYS 11/02/23   Neomi Johnston ONEIDA, PA-C  ferrous gluconate  (FERGON) 324 MG tablet Take 1 tablet (324 mg total) by mouth 3 (three) times daily with meals. 03/17/22   Gherghe, Costin M, MD  fluticasone (FLONASE) 50 MCG/ACT nasal spray Place 2 sprays into the nose daily as needed for allergies.    [provider]  gabapentin  (NEURONTIN ) 300 MG capsule TAKE 1 CAPSULE BY MOUTH TWICE A DAY 06/24/23   Dorsey, John T IV, MD  guaiFENesin  (MUCINEX ) 600 MG 12 hr tablet Take 600 mg by mouth 2 (two) times daily.    [provider]  levothyroxine  (SYNTHROID ) 100 MCG tablet Take 100 mcg by mouth See admin instructions. Take 1 tablet (100 mcg) by mouth on Mondays, Tuesdays, Wednesdays, Thursdays, Fridays & Saturdays in the morning. Skip a  dose on Sundays. 10/14/21   [provider]  loratadine (CLARITIN) 10 MG tablet Take 10 mg by mouth daily as needed for allergies. 01/04/20   [provider]  meclizine  (ANTIVERT ) 25 MG tablet Take 1 tablet (25 mg total) by mouth 3 (three) times daily as needed for dizziness. 03/20/13   Loreli Elyn SAILOR, MD  metoprolol  succinate (TOPROL -XL) 25 MG 24 hr tablet Take 1 tablet (25 mg total) by mouth daily. Take with or immediately following a meal. 04/03/22   Fenton, Clint R, PA  Olmesartan-amLODIPine -HCTZ 20-5-12.5 MG TABS Take 1 tablet by mouth daily. 02/14/23   [provider]  prochlorperazine  (COMPAZINE ) 10 MG  tablet Take 1 tablet (10 mg total) by mouth every 6 (six) hours as needed for nausea or vomiting. 01/30/22   Dorsey, John T IV, MD  rosuvastatin (CRESTOR) 20 MG tablet 1 tablet Orally Once a day for 90 days 11/21/22   [provider]  senna-docusate (SENOKOT-S) 8.6-50 MG tablet Take 1 tablet by mouth at bedtime as needed for mild constipation. 11/26/21   [provider]  traMADol  (ULTRAM ) 50 MG tablet Take 1 tablet (50 mg total) by mouth every 6 (six) hours as needed. 05/19/22   Federico Norleen ONEIDA MADISON, MD  vitamin B-12 (CYANOCOBALAMIN ) 500 MCG tablet Take 500 mcg by mouth in the morning. 07/19/15   [provider]    Allergies: Zithromax  [azithromycin ]    Review of Systems  Neurological:  Positive for weakness.  Negative unless otherwise noted in HPI  Updated Vital Signs BP 108/79   Pulse 82   Temp (!) 101 F (38.3 C) (Oral)   Resp 12   Ht 6' 5 (1.956 m)   Wt 110 kg   SpO2 97%   BMI 28.76 kg/m   Physical Exam Cardiovascular:     Rate and Rhythm: Normal rate and regular rhythm.  Pulmonary:     Effort: Pulmonary effort is normal. No respiratory distress.     Breath sounds: No wheezing.  Abdominal:     General: Bowel sounds are normal.     Palpations: Abdomen is soft.     Tenderness: There is no abdominal tenderness.     Comments: Protuberant but soft abdomen   Musculoskeletal:     Right lower leg: No edema.     Left lower leg: No edema.  Neurological:     Mental Status: He is alert and oriented to person, place, and time.  Psychiatric:        Mood and Affect: Mood normal.     (all labs ordered are listed, but only abnormal results are displayed) Labs Reviewed  RESP PANEL BY RT-PCR (RSV, FLU A&B, COVID)  RVPGX2 - Abnormal; Notable for the following components:      Result Value   SARS Coronavirus 2 by RT PCR POSITIVE (*)    All other components within normal limits  CBC WITH DIFFERENTIAL/PLATELET - Abnormal; Notable for the following components:    RBC 2.91 (*)    Hemoglobin 9.7 (*)    HCT 30.7 (*)    MCV 105.5 (*)    Platelets 115 (*)    Lymphs Abs 0.6 (*)    Monocytes Absolute 1.2 (*)    All other components within normal limits  CULTURE, BLOOD (ROUTINE X 2)  CULTURE, BLOOD (ROUTINE X 2)  COMPREHENSIVE METABOLIC PANEL WITH GFR  I-STAT CG4 LACTIC ACID, ED    EKG: EKG Interpretation Date/Time:  Wednesday April 06 2024 17:07:54 EDT Ventricular Rate:  58 PR Interval:  240 QRS Duration:  90 QT Interval:  447 QTC Calculation: 439 R Axis:   -27  Text Interpretation: Sinus rhythm Prolonged PR interval LVH with secondary repolarization abnormality Confirmed by Ula Barter [phone]) on 04/06/2024 5:14:33 PM  Radiology: DG Chest 2 View Result Date: 04/06/2024 CLINICAL DATA:  Shortness of breath, COVID positive EXAM: CHEST - 2 VIEW COMPARISON:  03/12/2022 FINDINGS: Mild hyperinflation. Heart and mediastinal contours are within normal limits. No focal opacities or effusions. No acute bony abnormality. IMPRESSION: No active cardiopulmonary disease. Electronically Signed   By: Franky Crease M.D.   On: 04/06/2024 18:15     Procedures   Medications Ordered in the ED  acetaminophen  (TYLENOL ) tablet 650 mg (650 mg Oral Given 04/06/24 1852)                                    Medical Decision Making Risk OTC drugs.  Initial plan:  Differential include viral respiratory infection, pneumonia, urinary tract infection,sepsis, autoimmune conditions, malignancy.  Will order CBC and respiratory viral panel to assess for signs infection.  Chest x-ray also ordered to look for pneumonia.  Although patient is febrile in ED, his mental status is reassuring and seems to be hemodynamically stable. This is reassuring against sepsis.  Patient was sent to the hospital for antibiotics, will look at CBC to assess for signs of neutropenic fever.  If CBC shows signs of neutropenia, will likely have to admit to the hospital for antibiotics and precautions. If  CBC is reassuring, then can likely send patient home on oral antibiotics if CXR does show signs of pneumonia. Patient was given tylenol  for fever.  Since patient also complained of occasional dysuria, will get UA to rule out any infection.   Final plan and assessment: Although patient is febrile, other vital signs at this time are reassuring against sepsis. Lactic acid is also within normal limits.Patient is hemodynamically stable.  CBC does not show signs of neutropenia and patient does not have leukocytosis. Respiratory viral panel did show that patient is positive for COVID. CXR showed no focal opacities or effusions. No concern for pneumonia or neutropenic fever. No treatment at this time except for supportive care with fever control if above 101 and drinking plenty of fluids. Will also send patient home with molnupiravir  as patient is within five days of symptoms starting and tessalon  pearls. Platelets are down from 242 to 115, but patient has no signs of bleeding which are reassuring. Should follow up with primary care provider outpatient for this.  UA was drawn due to endorsing symptoms of occasional dysuria and showed. UA is negative for leukocytes and nitrite, no bacteria seen. No signs of infection from this. Also of note on CMP, creatinine was 3.19, which was decreased is better than 3.69 on 7/21. Patient's baseline seems to be around 2.5-2.6. Does have history of CKD, but should likely follow up with PCP and nephrologist for this.       Final diagnoses:  None    ED Discharge Orders     None          D'Mello, Sephira Zellman, DO 04/06/24 2226    Dreama Longs, MD 04/07/24 1017

## 2024-04-06 NOTE — Discharge Instructions (Addendum)
 You came in because you were told that you had tested positive for covid. You were also told at your primary care doctor that you had pneumonia based off of a chest x ray. We were able to repeat a chest x ray here in the hospital and did not see any signs of pneumonia on that image. We did test you again for covid in the hospital and we did confirm that you were positive for Covid. You did have a fever here in the hospital for which we gave you tylenol .  Because you did not have any signs of pneumonia, there is no need for any antibiotics at this time. We have prescribed molnupiravair( Lagevrio ) for you to take for 5 days. This has been sent to your local pharmacy at [facility]. We have also given you a prescription for something to help your cough.  We also checked your urine as you were complaining of pain when you urinated. Your urine did not show any signs of infection. We did notice that your kidney numbers are improving from the last time they were checked on, but please get these rechecked with your primary care doctor or kidney doctor to monitor for signs of continued improvement. Please return to the ED if you have progressive weakness, worsening cough or shortness of breath, chest pain, or uncontrolled fever.

## 2024-04-06 NOTE — ED Provider Triage Note (Signed)
 Emergency Medicine Provider Triage Evaluation Note  Darren Hinton. , a 78 y.o. male  was evaluated in triage.  Pt complains of covid positive and bilateral pneumonia. Patient has hx of multiple myeloma, currently on treatment. Fever of 101. Denies shortness of breath, abdominal.   Review of Systems  Positive: Fever, covid +, bilateral pneumonia Negative: Chest pain, shortness of breath, abdominal pain  Physical Exam  BP 106/62   Pulse (!) 59   Temp (!) 101 F (38.3 C) (Oral)   Resp 16   Ht 6' 5 (1.956 m)   Wt 110 kg   SpO2 94%   BMI 28.76 kg/m  Gen:   Awake, no distress   Resp:  Normal effort  MSK:   Moves extremities without difficulty  Other:  Lungs overall sound good, possible decreased breath sounds over R lower lung but no obvious wheezing, rales, or rhonchi  Medical Decision Making  Medically screening exam initiated at 5:13 PM.  Appropriate orders placed.  Darren Hinton. was informed that the remainder of the evaluation will be completed by another provider, this initial triage assessment does not replace that evaluation, and the importance of remaining in the ED until their evaluation is complete.  Orders: cbc, bmp, covid, cxr   Darren Terrall FALCON, PA-C 04/06/24 1729

## 2024-04-08 ENCOUNTER — Other Ambulatory Visit: Payer: Self-pay

## 2024-04-08 ENCOUNTER — Encounter (HOSPITAL_COMMUNITY): Payer: Self-pay

## 2024-04-08 ENCOUNTER — Inpatient Hospital Stay (HOSPITAL_COMMUNITY)
Admission: EM | Admit: 2024-04-08 | Discharge: 2024-04-11 | DRG: 871 | Disposition: A | Discharge status: Home / Self Care | Source: Ambulatory Visit | Attending: Family Medicine | Admitting: Family Medicine

## 2024-04-08 DIAGNOSIS — I4891 Unspecified atrial fibrillation: Secondary | ICD-10-CM | POA: Diagnosis present

## 2024-04-08 DIAGNOSIS — Z7901 Long term (current) use of anticoagulants: Secondary | ICD-10-CM

## 2024-04-08 DIAGNOSIS — I129 Hypertensive chronic kidney disease with stage 1 through stage 4 chronic kidney disease, or unspecified chronic kidney disease: Secondary | ICD-10-CM | POA: Diagnosis present

## 2024-04-08 DIAGNOSIS — Z79899 Other long term (current) drug therapy: Secondary | ICD-10-CM

## 2024-04-08 DIAGNOSIS — M5416 Radiculopathy, lumbar region: Secondary | ICD-10-CM | POA: Diagnosis present

## 2024-04-08 DIAGNOSIS — Z79890 Hormone replacement therapy (postmenopausal): Secondary | ICD-10-CM

## 2024-04-08 DIAGNOSIS — B9683 Acinetobacter baumannii as the cause of diseases classified elsewhere: Secondary | ICD-10-CM | POA: Diagnosis present

## 2024-04-08 DIAGNOSIS — Z87891 Personal history of nicotine dependence: Secondary | ICD-10-CM

## 2024-04-08 DIAGNOSIS — N179 Acute kidney failure, unspecified: Secondary | ICD-10-CM | POA: Diagnosis present

## 2024-04-08 DIAGNOSIS — Z860100 Personal history of colon polyps, unspecified: Secondary | ICD-10-CM

## 2024-04-08 DIAGNOSIS — C9000 Multiple myeloma not having achieved remission: Secondary | ICD-10-CM | POA: Diagnosis present

## 2024-04-08 DIAGNOSIS — I341 Nonrheumatic mitral (valve) prolapse: Secondary | ICD-10-CM | POA: Diagnosis present

## 2024-04-08 DIAGNOSIS — R7881 Bacteremia: Principal | ICD-10-CM

## 2024-04-08 DIAGNOSIS — E785 Hyperlipidemia, unspecified: Secondary | ICD-10-CM | POA: Diagnosis present

## 2024-04-08 DIAGNOSIS — U071 COVID-19: Secondary | ICD-10-CM | POA: Diagnosis present

## 2024-04-08 DIAGNOSIS — D649 Anemia, unspecified: Secondary | ICD-10-CM | POA: Diagnosis present

## 2024-04-08 DIAGNOSIS — E039 Hypothyroidism, unspecified: Secondary | ICD-10-CM | POA: Diagnosis present

## 2024-04-08 DIAGNOSIS — Z841 Family history of disorders of kidney and ureter: Secondary | ICD-10-CM

## 2024-04-08 DIAGNOSIS — D849 Immunodeficiency, unspecified: Secondary | ICD-10-CM | POA: Diagnosis present

## 2024-04-08 DIAGNOSIS — R001 Bradycardia, unspecified: Secondary | ICD-10-CM | POA: Diagnosis not present

## 2024-04-08 DIAGNOSIS — N184 Chronic kidney disease, stage 4 (severe): Secondary | ICD-10-CM | POA: Diagnosis present

## 2024-04-08 DIAGNOSIS — Z881 Allergy status to other antibiotic agents status: Secondary | ICD-10-CM

## 2024-04-08 DIAGNOSIS — I480 Paroxysmal atrial fibrillation: Secondary | ICD-10-CM | POA: Diagnosis present

## 2024-04-08 LAB — BLOOD CULTURE ID PANEL (REFLEXED) - BCID2

## 2024-04-08 LAB — URINE CULTURE: Culture: <10000 — AB

## 2024-04-08 LAB — BASIC METABOLIC PANEL WITH GFR
Anion gap: 5 (ref 5–15)
BUN: 28 mg/dL — ABNORMAL HIGH (ref 8–23)
CO2: 23 mmol/L (ref 22–32)
Calcium: 8.7 mg/dL — ABNORMAL LOW (ref 8.9–10.3)
Chloride: 109 mmol/L (ref 98–111)
Creatinine, Ser: 3.32 mg/dL — ABNORMAL HIGH (ref 0.61–1.24)
GFR, Estimated: 18 mL/min — ABNORMAL LOW (ref >60)
Glucose, Bld: 93 mg/dL (ref 70–99)
Potassium: 4.3 mmol/L (ref 3.5–5.1)
Sodium: 137 mmol/L (ref 135–145)

## 2024-04-08 LAB — CBC WITH DIFFERENTIAL/PLATELET
Abs Immature Granulocytes: 0.04 K/uL (ref 0.00–0.07)
Basophils Absolute: 0 K/uL (ref 0.0–0.1)
Basophils Relative: 1 %
Eosinophils Absolute: 0.4 K/uL (ref 0.0–0.5)
Eosinophils Relative: 8 %
HCT: 32.5 % — ABNORMAL LOW (ref 39.0–52.0)
Hemoglobin: 9.7 g/dL — ABNORMAL LOW (ref 13.0–17.0)
Immature Granulocytes: 1 %
Lymphocytes Relative: 15 %
Lymphs Abs: 0.9 K/uL (ref 0.7–4.0)
MCH: 31.6 pg (ref 26.0–34.0)
MCHC: 29.8 g/dL — ABNORMAL LOW (ref 30.0–36.0)
MCV: 105.9 fL — ABNORMAL HIGH (ref 80.0–100.0)
Monocytes Absolute: 1 K/uL (ref 0.1–1.0)
Monocytes Relative: 17 %
Neutro Abs: 3.4 K/uL (ref 1.7–7.7)
Neutrophils Relative %: 58 %
Platelets: 137 K/uL — ABNORMAL LOW (ref 150–400)
RBC: 3.07 MIL/uL — ABNORMAL LOW (ref 4.22–5.81)
RDW: 14.9 % (ref 11.5–15.5)
WBC: 5.8 K/uL (ref 4.0–10.5)
nRBC: 0 % (ref 0.0–0.2)

## 2024-04-08 LAB — I-STAT CG4 LACTIC ACID, ED: Lactic Acid, Venous: 0.8 mmol/L (ref 0.5–1.9)

## 2024-04-08 MED ORDER — APIXABAN 5 MG PO TABS
5.0000 mg | ORAL_TABLET | Freq: Two times a day (BID) | ORAL | Status: DC
  Administered 2024-04-08 – 2024-04-11 (×6): 5 mg via ORAL
  Filled 2024-04-08 (×6): qty 1

## 2024-04-08 MED ORDER — GABAPENTIN 300 MG PO CAPS
300.0000 mg | ORAL_CAPSULE | Freq: Two times a day (BID) | ORAL | Status: DC
  Administered 2024-04-08 – 2024-04-11 (×6): 300 mg via ORAL
  Filled 2024-04-08 (×6): qty 1

## 2024-04-08 MED ORDER — ONDANSETRON HCL 4 MG PO TABS: 4.0000 mg | ORAL_TABLET | Freq: Four times a day (QID) | ORAL | Status: DC | PRN

## 2024-04-08 MED ORDER — ACETAMINOPHEN 325 MG PO TABS: 650.0000 mg | ORAL_TABLET | Freq: Four times a day (QID) | ORAL | Status: DC | PRN

## 2024-04-08 MED ORDER — ONDANSETRON HCL 4 MG/2ML IJ SOLN: 4.0000 mg | Freq: Four times a day (QID) | INTRAMUSCULAR | Status: DC | PRN

## 2024-04-08 MED ORDER — HEPARIN SODIUM (PORCINE) 5000 UNIT/ML IJ SOLN: 5000.0000 [IU] | Freq: Three times a day (TID) | INTRAMUSCULAR | Status: DC

## 2024-04-08 MED ORDER — MOLNUPIRAVIR EUA 200MG CAPSULE
4.0000 | ORAL_CAPSULE | Freq: Two times a day (BID) | ORAL | Status: DC
  Administered 2024-04-08 – 2024-04-11 (×7): 800 mg via ORAL

## 2024-04-08 MED ORDER — AMIODARONE HCL 200 MG PO TABS
200.0000 mg | ORAL_TABLET | Freq: Every day | ORAL | Status: DC
  Administered 2024-04-08 – 2024-04-11 (×4): 200 mg via ORAL
  Filled 2024-04-08 (×4): qty 1

## 2024-04-08 MED ORDER — BENZONATATE 100 MG PO CAPS
100.0000 mg | ORAL_CAPSULE | Freq: Three times a day (TID) | ORAL | Status: DC | PRN
  Administered 2024-04-08 – 2024-04-10 (×3): 100 mg via ORAL
  Filled 2024-04-08 (×3): qty 1

## 2024-04-08 MED ORDER — ALBUTEROL SULFATE (2.5 MG/3ML) 0.083% IN NEBU: 2.5000 mg | INHALATION_SOLUTION | RESPIRATORY_TRACT | Status: DC | PRN

## 2024-04-08 MED ORDER — ALLOPURINOL 300 MG PO TABS
300.0000 mg | ORAL_TABLET | Freq: Every day | ORAL | Status: DC
  Administered 2024-04-08 – 2024-04-11 (×4): 300 mg via ORAL
  Filled 2024-04-08 (×4): qty 1

## 2024-04-08 MED ORDER — ACYCLOVIR 400 MG PO TABS
400.0000 mg | ORAL_TABLET | Freq: Two times a day (BID) | ORAL | Status: DC
  Administered 2024-04-08 – 2024-04-11 (×6): 400 mg via ORAL
  Filled 2024-04-08 (×6): qty 1

## 2024-04-08 MED ORDER — TRAZODONE HCL 50 MG PO TABS
25.0000 mg | ORAL_TABLET | Freq: Every evening | ORAL | Status: DC | PRN
Start: 2024-04-08 — End: 2024-04-11
  Administered 2024-04-10: 25 mg via ORAL
  Filled 2024-04-08: qty 1

## 2024-04-08 MED ORDER — ACETAMINOPHEN 650 MG RE SUPP: 650.0000 mg | Freq: Four times a day (QID) | RECTAL | Status: DC | PRN

## 2024-04-08 MED ORDER — SODIUM CHLORIDE 0.9 % IV SOLN
2.0000 g | INTRAVENOUS | Status: DC
  Administered 2024-04-08 – 2024-04-10 (×3): 2 g via INTRAVENOUS
  Filled 2024-04-08 (×3): qty 20

## 2024-04-08 MED ORDER — ALPRAZOLAM 0.25 MG PO TABS
0.2500 mg | ORAL_TABLET | Freq: Two times a day (BID) | ORAL | Status: DC | PRN
  Administered 2024-04-08 – 2024-04-10 (×3): 0.25 mg via ORAL
  Filled 2024-04-08 (×3): qty 1

## 2024-04-08 MED ORDER — SODIUM CHLORIDE 0.9 % IV SOLN: 1.0000 g | Freq: Once | INTRAVENOUS | Status: DC

## 2024-04-08 NOTE — ED Provider Notes (Signed)
 [place] EMERGENCY DEPARTMENT AT [facility] Provider Note   CSN: [id number] Arrival date & time: 04/08/24  9144     Patient presents with: abnormal labs   Darren Hinton. is a 78 y.o. male.   Is a 78 year old male with a history of multiple myeloma on maintenance therapy, hyperlipidemia, paroxysmal atrial fibrillation, hypertension, mitral valve prolapse, kidney disease who presents with positive blood cultures.  He was seen here 2 days ago for some upper respiratory symptoms and fatigue.  He was noted to have a fever then of 101.  His COVID test was positive.  He was otherwise well-appearing and was discharged home.  He says he still feels a little fatigued but overall feeling a bit better.  He was discharged on molnupiravir .  He denies any shortness of breath.  No known fevers in the last 24 hours.  No urinary symptoms.  No nausea or vomiting.  He had blood cultures drawn on that visit 2 days ago and the preliminary results were positive for gram-negative rods.       Prior to Admission medications   Medication Sig Start Date End Date Taking? Authorizing Provider  acyclovir  (ZOVIRAX ) 400 MG tablet TAKE 1 TABLET BY MOUTH TWICE A DAY 01/05/24   Federico Norleen ONEIDA MADISON, MD  allopurinol  (ZYLOPRIM ) 300 MG tablet TAKE 1 TABLET BY MOUTH EVERY DAY 01/05/24   Dorsey, John T IV, MD  ALPRAZolam (XANAX) 0.25 MG tablet Take 0.25 mg by mouth 2 (two) times daily as needed for anxiety.    [name], MD  amiodarone  (PACERONE ) 200 MG tablet TAKE 1 TABLET BY MOUTH EVERY DAY 05/15/23   Croitoru, Mihai, MD  amLODipine  (NORVASC ) 5 MG tablet TAKE 1 TABLET (5 MG TOTAL) BY MOUTH DAILY. 05/15/23   Dorsey, John T IV, MD  amoxicillin-clavulanate (AUGMENTIN) 875-125 MG tablet Take 1 tablet by mouth 2 (two) times daily. 10/15/23   [provider]  apixaban  (ELIQUIS ) 5 MG TABS tablet Take 1 tablet (5 mg total) by mouth 2 (two) times daily. 07/18/22 03/30/23  Croitoru, Mihai, MD  benzonatate   (TESSALON ) 100 MG capsule Take 1 capsule (100 mg total) by mouth every 8 (eight) hours. 04/06/24   Dreama Longs, MD  dexamethasone  (DECADRON ) 4 MG tablet TAKE 10 TABLETS (40 MG TOTAL) BY MOUTH ONCE A WEEK IN THE MORNING ON CHEMOTHERAPY DAYS 11/02/23   Neomi Johnston ONEIDA, PA-C  ferrous gluconate  (FERGON) 324 MG tablet Take 1 tablet (324 mg total) by mouth 3 (three) times daily with meals. 03/17/22   Gherghe, Costin M, MD  fluticasone (FLONASE) 50 MCG/ACT nasal spray Place 2 sprays into the nose daily as needed for allergies.    [provider]  gabapentin  (NEURONTIN ) 300 MG capsule TAKE 1 CAPSULE BY MOUTH TWICE A DAY 06/24/23   Federico Norleen ONEIDA MADISON, MD  guaiFENesin  (MUCINEX ) 600 MG 12 hr tablet Take 600 mg by mouth 2 (two) times daily.    [provider]  levothyroxine  (SYNTHROID ) 100 MCG tablet Take 100 mcg by mouth See admin instructions. Take 1 tablet (100 mcg) by mouth on Mondays, Tuesdays, Wednesdays, Thursdays, Fridays & Saturdays in the morning. Skip a dose on Sundays. 10/14/21   [provider]  loratadine (CLARITIN) 10 MG tablet Take 10 mg by mouth daily as needed for allergies. 01/04/20   [provider]  meclizine  (ANTIVERT ) 25 MG tablet Take 1 tablet (25 mg total) by mouth 3 (three) times daily as needed for dizziness. 03/20/13  Loreli Elyn SAILOR, MD  metoprolol  succinate (TOPROL -XL) 25 MG 24 hr tablet Take 1 tablet (25 mg total) by mouth daily. Take with or immediately following a meal. 04/03/22   Fenton, Clint R, PA  molnupiravir  EUA (LAGEVRIO ) 200 mg CAPS capsule Take 4 capsules (800 mg total) by mouth 2 (two) times daily for 5 days. 04/06/24 04/11/24  Dreama Longs, MD  Olmesartan-amLODIPine -HCTZ 20-5-12.5 MG TABS Take 1 tablet by mouth daily. 02/14/23   [provider]  prochlorperazine  (COMPAZINE ) 10 MG tablet Take 1 tablet (10 mg total) by mouth every 6 (six) hours as needed for nausea or vomiting. 01/30/22   Dorsey, John T IV, MD  rosuvastatin (CRESTOR) 20  MG tablet 1 tablet Orally Once a day for 90 days 11/21/22   [provider]  senna-docusate (SENOKOT-S) 8.6-50 MG tablet Take 1 tablet by mouth at bedtime as needed for mild constipation. 11/26/21   [provider]  traMADol  (ULTRAM ) 50 MG tablet Take 1 tablet (50 mg total) by mouth every 6 (six) hours as needed. 05/19/22   Federico Norleen ONEIDA MADISON, MD  vitamin B-12 (CYANOCOBALAMIN ) 500 MCG tablet Take 500 mcg by mouth in the morning. 07/19/15   [provider]    Allergies: Zithromax  [azithromycin ]    Review of Systems  Constitutional:  Positive for fatigue. Negative for chills, diaphoresis and fever.  HENT:  Positive for congestion. Negative for rhinorrhea and sneezing.   Eyes: Negative.   Respiratory:  Positive for cough. Negative for chest tightness and shortness of breath.   Cardiovascular:  Negative for chest pain and leg swelling.  Gastrointestinal:  Negative for abdominal pain, diarrhea, nausea and vomiting.  Genitourinary:  Negative for difficulty urinating, flank pain and frequency.  Musculoskeletal:  Negative for arthralgias and back pain.  Skin:  Negative for rash.  Neurological:  Negative for dizziness, numbness and headaches.    Updated Vital Signs BP 111/66 (BP Location: Left Arm)   Pulse (!) 42   Temp 98.5 F (36.9 C) (Oral)   Resp 20   SpO2 100%   Physical Exam Constitutional:      Appearance: He is well-developed.  HENT:     Head: Normocephalic and atraumatic.  Eyes:     Pupils: Pupils are equal, round, and reactive to light.  Cardiovascular:     Rate and Rhythm: Normal rate and regular rhythm.     Heart sounds: Normal heart sounds.  Pulmonary:     Effort: Pulmonary effort is normal. No respiratory distress.     Breath sounds: Normal breath sounds. No wheezing or rales.  Chest:     Chest wall: No tenderness.  Abdominal:     General: Bowel sounds are normal.     Palpations: Abdomen is soft.     Tenderness: There is no abdominal  tenderness. There is no guarding or rebound.  Musculoskeletal:        General: Normal range of motion.     Cervical back: Normal range of motion and neck supple.  Lymphadenopathy:     Cervical: No cervical adenopathy.  Skin:    General: Skin is warm and dry.     Findings: No rash.  Neurological:     Mental Status: He is alert and oriented to person, place, and time.     (all labs ordered are listed, but only abnormal results are displayed) Labs Reviewed  BASIC METABOLIC PANEL WITH GFR - Abnormal; Notable for the following components:      Result Value  BUN 28 (*)    Creatinine, Ser 3.32 (*)    Calcium 8.7 (*)    GFR, Estimated 18 (*)    All other components within normal limits  CBC WITH DIFFERENTIAL/PLATELET - Abnormal; Notable for the following components:   RBC 3.07 (*)    Hemoglobin 9.7 (*)    HCT 32.5 (*)    MCV 105.9 (*)    MCHC 29.8 (*)    Platelets 137 (*)    All other components within normal limits  CULTURE, BLOOD (ROUTINE X 2)  CULTURE, BLOOD (ROUTINE X 2)  I-STAT CG4 LACTIC ACID, ED  I-STAT CG4 LACTIC ACID, ED    EKG: None  Radiology: DG Chest 2 View Result Date: 04/06/2024 CLINICAL DATA:  Shortness of breath, COVID positive EXAM: CHEST - 2 VIEW COMPARISON:  03/12/2022 FINDINGS: Mild hyperinflation. Heart and mediastinal contours are within normal limits. No focal opacities or effusions. No acute bony abnormality. IMPRESSION: No active cardiopulmonary disease. Electronically Signed   By: Franky Crease M.D.   On: 04/06/2024 18:15     Procedures   Medications Ordered in the ED  cefTRIAXone  (ROCEPHIN ) 1 g in sodium chloride  0.9 % 100 mL IVPB (has no administration in time range)                                    Medical Decision Making Amount and/or Complexity of Data Reviewed Labs: ordered.   This patient presents to the ED for concern of positive blood cultures, this involves an extensive number of treatment options, and is a complaint that  carries with it a high risk of complications and morbidity.  I considered the following differential and admission for this acute, potentially life threatening condition.  The differential diagnosis includes bacteremia, sepsis, contamination, UTI, endocarditis  MDM:    Patient is a 78 year old who presents with fevers with the last time 2 days ago and respiratory symptoms.  He did test positive for COVID 2 days ago.  He does not have any worsening symptoms.  His blood cultures were positive for gram-negative rods.  He currently denies any urinary symptoms.  His urine was sent for culture on his last visit 2 days ago.  I reviewed his chest x-ray from 2 days ago and it did not show any evidence of pneumonia.  Labs are reviewed and are nonconcerning other than his creatinine is a bit higher than his baseline value.  He was given a dose of IV Rocephin .  Blood cultures were redrawn.  Discussed with Dr. Roxane who will admit the patient for further treatment.  (Labs, imaging, consults)  Labs: I Ordered, and personally interpreted labs.  The pertinent results include: Normal white count, normal lactic acid  Imaging Studies ordered: I ordered imaging studies including chest x-ray I independently visualized and interpreted imaging. I agree with the radiologist interpretation  Additional history obtained from chart.  External records from outside source obtained and reviewed including prior notes  Cardiac Monitoring: The patient was not maintained on a cardiac monitor.  If on the cardiac monitor, I personally viewed and interpreted the cardiac monitored which showed an underlying rhythm of:    Reevaluation: After the interventions noted above, I reevaluated the patient and found that they have :stayed the same  Social Determinants of Health:    Disposition:   Admit to hospital  Co morbidities that complicate the patient evaluation  Past Medical History:  Diagnosis Date   Allergy    Anxiety     Arthritis    Depression    Heart murmur    Hyperlipidemia    Hyperplastic colon polyp    Hyperproteinemia    Hypertension    Hypothyroidism    Kidney disease    Mitral valve prolapse    Multiple myeloma (HCC)    Paroxysmal atrial fibrillation (HCC)    Thyroid disease      Medicines Meds ordered this encounter  Medications   cefTRIAXone  (ROCEPHIN ) 1 g in sodium chloride  0.9 % 100 mL IVPB    Antibiotic Indication::   Bacteremia    I have reviewed the patients home medicines and have made adjustments as needed  Problem List / ED Course: Problem List Items Addressed This Visit   None Visit Diagnoses       Bacteremia    -  Primary                Final diagnoses:  Bacteremia    ED Discharge Orders     None          Lenor Hollering, MD 04/08/24 1138

## 2024-04-08 NOTE — ED Triage Notes (Signed)
 Pt states that he was called to come back in for abnormal labs. Pt denies any other issues.

## 2024-04-08 NOTE — ED Notes (Addendum)
 Lab called 04/08/2024 at 8am and said 1 anaerobic blood culture bottle came back with gram negative rods. BCID none detected. Spoke with EDP Dr. Lenor. She said to call patient to come back in for further evaluation. RN called patient. He verbalized understanding.

## 2024-04-08 NOTE — Plan of Care (Signed)

## 2024-04-08 NOTE — H&P (Signed)
 History and Physical  Darren Hinton. FMW:[id number] DOB: 15-Nov-1945 DOA: 04/08/2024  PCP: [name], MD   Chief Complaint: Positive blood culture  HPI: Darren Hinton. is a 78 y.o. male with medical history significant for multiple myeloma, paroxysmal atrial fibrillation and recent COVID infection being admitted to the hospital with concerns for bacteremia.  Patient states he has been in his usual state of health and tolerating his multiple myeloma treatments well, for the last 3 to 5 days he was having quite a bit of weakness to the point that he was having trouble even getting out of bed.  He saw his PCP, was sent to the ER on 7/30 for evaluation.  He had tested positive for COVID as an outpatient, and there was also concern for pneumonia on an outpatient chest x-ray.  Workup in the ER on 7/30 showed no evidence of pneumonia but he tested positive for COVID, was discharged home with molnupiravir .  He has been taking this medication, and actually he feels much better, he feels back to normal.  Specifically denies any cough, shortness of breath, nausea, vomiting, or fever.  He was called back to the ER because 1 bottle blood culture drawn on 7/30 is growing gram-negative rods.  Review of Systems: Please see HPI for pertinent positives and negatives. A complete 10 system review of systems are otherwise negative.  Past Medical History:  Diagnosis Date   Allergy    Anxiety    Arthritis    Depression    Heart murmur    Hyperlipidemia    Hyperplastic colon polyp    Hyperproteinemia    Hypertension    Hypothyroidism    Kidney disease    Mitral valve prolapse    Multiple myeloma (HCC)    Paroxysmal atrial fibrillation (HCC)    Thyroid disease    Past Surgical History:  Procedure Laterality Date   CARDIOVERSION N/A 03/14/2022   Procedure: CARDIOVERSION;  Surgeon: Mona Vinie BROCKS, MD;  Location: [facility] ENDOSCOPY;  Service: Cardiovascular;  Laterality: N/A;   TEE WITHOUT  CARDIOVERSION N/A 03/14/2022   Procedure: TRANSESOPHAGEAL ECHOCARDIOGRAM (TEE);  Surgeon: Mona Vinie BROCKS, MD;  Location: [facility] ENDOSCOPY;  Service: Cardiovascular;  Laterality: N/A;   Social History:  reports that he quit smoking about 45 years ago. His smoking use included cigarettes. He started smoking about 65 years ago. He has never used smokeless tobacco. He reports that he does not currently use alcohol after a past usage of about 2.0 standard drinks of alcohol per week. He reports that he does not use drugs.  Allergies  Allergen Reactions   Zithromax  [Azithromycin ] Nausea And Vomiting and Other (See Comments)    Sick on the stomach    Family History  Problem Relation Age of Onset   Kidney disease Mother    Prostate cancer Father    Kidney disease Brother    Colon cancer Neg Hx    Esophageal cancer Neg Hx    Liver cancer Neg Hx    Pancreatic cancer Neg Hx    Rectal cancer Neg Hx    Stomach cancer Neg Hx      Prior to Admission medications   Medication Sig Start Date End Date Taking? Authorizing Provider  acyclovir  (ZOVIRAX ) 400 MG tablet TAKE 1 TABLET BY MOUTH TWICE A DAY 01/05/24   Federico Norleen ONEIDA MADISON, MD  allopurinol  (ZYLOPRIM ) 300 MG tablet TAKE 1 TABLET BY MOUTH EVERY DAY 01/05/24   Dorsey, John T IV, MD  ALPRAZolam (XANAX) 0.25 MG tablet Take 0.25 mg by mouth 2 (two) times daily as needed for anxiety.    [name], MD  amiodarone  (PACERONE ) 200 MG tablet TAKE 1 TABLET BY MOUTH EVERY DAY 05/15/23   Croitoru, Mihai, MD  amLODipine  (NORVASC ) 5 MG tablet TAKE 1 TABLET (5 MG TOTAL) BY MOUTH DAILY. 05/15/23   Dorsey, John T IV, MD  amoxicillin-clavulanate (AUGMENTIN) 875-125 MG tablet Take 1 tablet by mouth 2 (two) times daily. 10/15/23   [provider]  apixaban  (ELIQUIS ) 5 MG TABS tablet Take 1 tablet (5 mg total) by mouth 2 (two) times daily. 07/18/22 03/30/23  Croitoru, Mihai, MD  benzonatate  (TESSALON ) 100 MG capsule Take 1 capsule (100 mg total) by mouth every 8  (eight) hours. 04/06/24   Dreama Longs, MD  dexamethasone  (DECADRON ) 4 MG tablet TAKE 10 TABLETS (40 MG TOTAL) BY MOUTH ONCE A WEEK IN THE MORNING ON CHEMOTHERAPY DAYS 11/02/23   Neomi Johnston DASEN, PA-C  ferrous gluconate  (FERGON) 324 MG tablet Take 1 tablet (324 mg total) by mouth 3 (three) times daily with meals. 03/17/22   Gherghe, Costin M, MD  fluticasone (FLONASE) 50 MCG/ACT nasal spray Place 2 sprays into the nose daily as needed for allergies.    [provider]  gabapentin  (NEURONTIN ) 300 MG capsule TAKE 1 CAPSULE BY MOUTH TWICE A DAY 06/24/23   Federico Norleen DASEN MADISON, MD  guaiFENesin  (MUCINEX ) 600 MG 12 hr tablet Take 600 mg by mouth 2 (two) times daily.    [provider]  levothyroxine  (SYNTHROID ) 100 MCG tablet Take 100 mcg by mouth See admin instructions. Take 1 tablet (100 mcg) by mouth on Mondays, Tuesdays, Wednesdays, Thursdays, Fridays & Saturdays in the morning. Skip a dose on Sundays. 10/14/21   [provider]  loratadine (CLARITIN) 10 MG tablet Take 10 mg by mouth daily as needed for allergies. 01/04/20   [provider]  meclizine  (ANTIVERT ) 25 MG tablet Take 1 tablet (25 mg total) by mouth 3 (three) times daily as needed for dizziness. 03/20/13   Loreli Elyn SAILOR, MD  metoprolol  succinate (TOPROL -XL) 25 MG 24 hr tablet Take 1 tablet (25 mg total) by mouth daily. Take with or immediately following a meal. 04/03/22   Fenton, Clint R, PA  molnupiravir  EUA (LAGEVRIO ) 200 mg CAPS capsule Take 4 capsules (800 mg total) by mouth 2 (two) times daily for 5 days. 04/06/24 04/11/24  Dreama Longs, MD  Olmesartan-amLODIPine -HCTZ 20-5-12.5 MG TABS Take 1 tablet by mouth daily. 02/14/23   [provider]  prochlorperazine  (COMPAZINE ) 10 MG tablet Take 1 tablet (10 mg total) by mouth every 6 (six) hours as needed for nausea or vomiting. 01/30/22   Dorsey, John T IV, MD  rosuvastatin (CRESTOR) 20 MG tablet 1 tablet Orally Once a day for 90 days 11/21/22   [provider]  senna-docusate (SENOKOT-S) 8.6-50 MG tablet Take 1 tablet by mouth at bedtime as needed for mild constipation. 11/26/21   [provider]  traMADol  (ULTRAM ) 50 MG tablet Take 1 tablet (50 mg total) by mouth every 6 (six) hours as needed. 05/19/22   Federico Norleen DASEN MADISON, MD  vitamin B-12 (CYANOCOBALAMIN ) 500 MCG tablet Take 500 mcg by mouth in the morning. 07/19/15   [provider]    Physical Exam: BP 111/66 (BP Location: Left Arm)   Pulse (!) 42   Temp 98.5 F (36.9 C) (Oral)   Resp 20   SpO2 100%  General:  Alert,  oriented, calm, in no acute distress  Eyes: EOMI, clear conjuctivae, white sclerea Neck: supple, no masses, trachea mildline  Cardiovascular: RRR, no murmurs or rubs, no peripheral edema  Respiratory: clear to auscultation bilaterally, no wheezes, no crackles  Abdomen: soft, nontender, nondistended, normal bowel tones heard  Skin: dry, no rashes  Musculoskeletal: no joint effusions, normal range of motion  Psychiatric: appropriate affect, normal speech  Neurologic: extraocular muscles intact, clear speech, moving all extremities with intact sensorium         Labs on Admission:  Basic Metabolic Panel: Recent Labs  Lab 04/06/24 2006 04/08/24 0958  NA 134* 137  K 4.2 4.3  CL 104 109  CO2 21* 23  GLUCOSE 94 93  BUN 26* 28*  CREATININE 3.19* 3.32*  CALCIUM 8.9 8.7*   Liver Function Tests: Recent Labs  Lab 04/06/24 2006  AST 30  ALT 29  ALKPHOS 52  BILITOT 0.8  PROT 6.8  ALBUMIN 3.8   No results for input(s): LIPASE, AMYLASE in the last 168 hours. No results for input(s): AMMONIA in the last 168 hours. CBC: Recent Labs  Lab 04/06/24 1729 04/08/24 0958  WBC 7.7 5.8  NEUTROABS 5.8 3.4  HGB 9.7* 9.7*  HCT 30.7* 32.5*  MCV 105.5* 105.9*  PLT 115* 137*   Cardiac Enzymes: No results for input(s): CKTOTAL, CKMB, CKMBINDEX, TROPONINI in the last 168 hours. BNP (last 3 results) No results for input(s): BNP  in the last 8760 hours.  ProBNP (last 3 results) No results for input(s): PROBNP in the last 8760 hours.  CBG: No results for input(s): GLUCAP in the last 168 hours.  Radiological Exams on Admission: DG Chest 2 View Result Date: 04/06/2024 CLINICAL DATA:  Shortness of breath, COVID positive EXAM: CHEST - 2 VIEW COMPARISON:  03/12/2022 FINDINGS: Mild hyperinflation. Heart and mediastinal contours are within normal limits. No focal opacities or effusions. No acute bony abnormality. IMPRESSION: No active cardiopulmonary disease. Electronically Signed   By: Franky Crease M.D.   On: 04/06/2024 18:15   Assessment/Plan Chayton Murata. is a 78 y.o. male with medical history significant for multiple myeloma, paroxysmal atrial fibrillation and recent COVID infection being admitted to the hospital with concerns for bacteremia.  Bacteremia-without evidence of ongoing infection -Observation -Follow-up repeat blood cultures drawn 8/1 -Empiric IV Rocephin  -Await final blood cultures from 7/30  COVID infection-asymptomatic, continue molnupiravir   Paroxysmal atrial fibrillation-continue home amiodarone  and Eliquis   Acute kidney injury superimposed on CKD-possibly due to recent acute illness including COVID infection.  Will plan to renally dose medications, avoid nephrotoxins.  Multiple myeloma-followed by Dr. Federico, on maintenance dose Velcade  every 2 weeks    Code Status: Full Code  Consults called: None  Admission status: Observation  Time spent: 49 minutes  Sesar Madewell CHRISTELLA Gail MD [facility] Pager [phone]  If 7PM-7AM, please contact night-coverage [email or web address] Password [email or web address]  04/08/2024, 12:40 PM

## 2024-04-09 DIAGNOSIS — E039 Hypothyroidism, unspecified: Secondary | ICD-10-CM | POA: Diagnosis present

## 2024-04-09 DIAGNOSIS — M5416 Radiculopathy, lumbar region: Secondary | ICD-10-CM | POA: Diagnosis present

## 2024-04-09 DIAGNOSIS — R001 Bradycardia, unspecified: Secondary | ICD-10-CM | POA: Diagnosis not present

## 2024-04-09 DIAGNOSIS — Z881 Allergy status to other antibiotic agents status: Secondary | ICD-10-CM | POA: Diagnosis not present

## 2024-04-09 DIAGNOSIS — I129 Hypertensive chronic kidney disease with stage 1 through stage 4 chronic kidney disease, or unspecified chronic kidney disease: Secondary | ICD-10-CM | POA: Diagnosis present

## 2024-04-09 DIAGNOSIS — I341 Nonrheumatic mitral (valve) prolapse: Secondary | ICD-10-CM | POA: Diagnosis present

## 2024-04-09 DIAGNOSIS — Z7901 Long term (current) use of anticoagulants: Secondary | ICD-10-CM | POA: Diagnosis not present

## 2024-04-09 DIAGNOSIS — C9000 Multiple myeloma not having achieved remission: Secondary | ICD-10-CM | POA: Diagnosis present

## 2024-04-09 DIAGNOSIS — D649 Anemia, unspecified: Secondary | ICD-10-CM | POA: Diagnosis present

## 2024-04-09 DIAGNOSIS — D849 Immunodeficiency, unspecified: Secondary | ICD-10-CM | POA: Diagnosis present

## 2024-04-09 DIAGNOSIS — Z79899 Other long term (current) drug therapy: Secondary | ICD-10-CM | POA: Diagnosis not present

## 2024-04-09 DIAGNOSIS — Z87891 Personal history of nicotine dependence: Secondary | ICD-10-CM | POA: Diagnosis not present

## 2024-04-09 DIAGNOSIS — N179 Acute kidney failure, unspecified: Secondary | ICD-10-CM | POA: Diagnosis present

## 2024-04-09 DIAGNOSIS — U071 COVID-19: Secondary | ICD-10-CM | POA: Diagnosis present

## 2024-04-09 DIAGNOSIS — N184 Chronic kidney disease, stage 4 (severe): Secondary | ICD-10-CM | POA: Diagnosis present

## 2024-04-09 DIAGNOSIS — Z841 Family history of disorders of kidney and ureter: Secondary | ICD-10-CM | POA: Diagnosis not present

## 2024-04-09 DIAGNOSIS — Z79890 Hormone replacement therapy (postmenopausal): Secondary | ICD-10-CM | POA: Diagnosis not present

## 2024-04-09 DIAGNOSIS — B9683 Acinetobacter baumannii as the cause of diseases classified elsewhere: Secondary | ICD-10-CM | POA: Diagnosis present

## 2024-04-09 DIAGNOSIS — I480 Paroxysmal atrial fibrillation: Secondary | ICD-10-CM | POA: Diagnosis present

## 2024-04-09 DIAGNOSIS — E785 Hyperlipidemia, unspecified: Secondary | ICD-10-CM | POA: Diagnosis present

## 2024-04-09 DIAGNOSIS — Z860100 Personal history of colon polyps, unspecified: Secondary | ICD-10-CM | POA: Diagnosis not present

## 2024-04-09 DIAGNOSIS — R7881 Bacteremia: Secondary | ICD-10-CM | POA: Diagnosis not present

## 2024-04-09 DIAGNOSIS — B9689 Other specified bacterial agents as the cause of diseases classified elsewhere: Secondary | ICD-10-CM | POA: Diagnosis not present

## 2024-04-09 LAB — BASIC METABOLIC PANEL WITH GFR
Anion gap: 8 (ref 5–15)
BUN: 28 mg/dL — ABNORMAL HIGH (ref 8–23)
CO2: 19 mmol/L — ABNORMAL LOW (ref 22–32)
Calcium: 8.3 mg/dL — ABNORMAL LOW (ref 8.9–10.3)
Chloride: 109 mmol/L (ref 98–111)
Creatinine, Ser: 3.12 mg/dL — ABNORMAL HIGH (ref 0.61–1.24)
GFR, Estimated: 20 mL/min — ABNORMAL LOW (ref >60)
Glucose, Bld: 84 mg/dL (ref 70–99)
Potassium: 4.1 mmol/L (ref 3.5–5.1)
Sodium: 136 mmol/L (ref 135–145)

## 2024-04-09 LAB — CBC
HCT: 30.4 % — ABNORMAL LOW (ref 39.0–52.0)
Hemoglobin: 9.1 g/dL — ABNORMAL LOW (ref 13.0–17.0)
MCH: 32 pg (ref 26.0–34.0)
MCHC: 29.9 g/dL — ABNORMAL LOW (ref 30.0–36.0)
MCV: 107 fL — ABNORMAL HIGH (ref 80.0–100.0)
Platelets: 145 K/uL — ABNORMAL LOW (ref 150–400)
RBC: 2.84 MIL/uL — ABNORMAL LOW (ref 4.22–5.81)
RDW: 14.8 % (ref 11.5–15.5)
WBC: 4.7 K/uL (ref 4.0–10.5)
nRBC: 0 % (ref 0.0–0.2)

## 2024-04-09 MED ORDER — METOPROLOL SUCCINATE ER 25 MG PO TB24
12.5000 mg | ORAL_TABLET | Freq: Every day | ORAL | Status: DC
  Administered 2024-04-09 – 2024-04-11 (×3): 12.5 mg via ORAL
  Filled 2024-04-09 (×3): qty 1

## 2024-04-09 MED ORDER — METOPROLOL SUCCINATE ER 25 MG PO TB24: 25.0000 mg | ORAL_TABLET | Freq: Every day | ORAL | Status: DC

## 2024-04-09 NOTE — Plan of Care (Signed)
  Problem: Respiratory: Goal: Will maintain a patent airway Outcome: Progressing   Problem: Respiratory: Goal: Complications related to the disease process, condition or treatment will be avoided or minimized Outcome: Progressing   

## 2024-04-09 NOTE — Progress Notes (Signed)
 PROGRESS NOTE    Darren LELON Prentiss Mickey.  FMW:[id number] DOB: 10/06/1945 DOA: 04/08/2024 PCP: [name], MD   Brief Narrative: Darren Tribby. is a 78 y.o. male with a history of multiple myeloma, paroxysmal atrial fibrillation, CKD IV.  Patient presented secondary to a positive blood culture result requiring IV antibiotics.   Assessment and Plan:  Gram negative rod bacteremia Noted from prior blood culture sample from 7/30. No source identified. No urinary or abdominal symptoms. Patient started empirically on Ceftriaxone  IV. Repeat blood cultures (8/1) obtained on admission. -Continue Ceftriaxone  -Follow-up blood culture data  COVID-19 infection Asymptomatic. Patient started on molnupiravir  as an outpatient. -Continue molnupiravir   Paroxysmal atrial fibrillation Patient is on amiodarone , Toprol  XL and Eliquis . Toprol  XL held on admission. Patient with bradycardia overnight. -Continue amiodarone  and Eliquis  -Resume Toprol  XL at decreased dose of 12.5 mg daily  AKI on CKD IV Baseline creatinine appears to be around 2.5-2.7. Creatinine of 3.32 on admission. Improved to 3.12 today.   Multiple myeloma Noted. Patient follows with medical oncology as an outpatient and is on Velcade .   DVT prophylaxis: Eliquis  Code Status:   Code Status: Full Code Family Communication: Friend at bedside Disposition Plan: Discharge home likely in 1-2 days pending culture data and transition to oral antibiotics   Consultants:  None  Procedures:  None  Antimicrobials: Ceftriaxone     Subjective: No specific concerns this morning.  Objective: BP 112/70 (BP Location: Left Arm)   Pulse 97   Temp 98.8 F (37.1 C) (Oral)   Resp 18   Ht 6' 5 (1.956 m)   Wt 110 kg   SpO2 99%   BMI 28.76 kg/m   Examination:  General exam: Appears calm and comfortable Respiratory system: Clear to auscultation. Respiratory effort normal. Cardiovascular system: S1 & S2 heard, RRR. No  murmurs Gastrointestinal system: Abdomen is nondistended, soft and nontender. Normal bowel sounds heard. Central nervous system: Alert and oriented. No focal neurological deficits. Psychiatry: Judgement and insight appear normal. Mood & affect appropriate.    Data Reviewed: I have personally reviewed following labs and imaging studies  CBC Lab Results  Component Value Date   WBC 4.7 04/09/2024   RBC 2.84 (L) 04/09/2024   HGB 9.1 (L) 04/09/2024   HCT 30.4 (L) 04/09/2024   MCV 107.0 (H) 04/09/2024   MCH 32.0 04/09/2024   PLT 145 (L) 04/09/2024   MCHC 29.9 (L) 04/09/2024   RDW 14.8 04/09/2024   LYMPHSABS 0.9 04/08/2024   MONOABS 1.0 04/08/2024   EOSABS 0.4 04/08/2024   BASOSABS 0.0 04/08/2024     Last metabolic panel Lab Results  Component Value Date   NA 136 04/09/2024   K 4.1 04/09/2024   CL 109 04/09/2024   CO2 19 (L) 04/09/2024   BUN 28 (H) 04/09/2024   CREATININE 3.12 (H) 04/09/2024   GLUCOSE 84 04/09/2024   GFRNONAA 20 (L) 04/09/2024   GFRAA 68 (L) 03/12/2014   CALCIUM 8.3 (L) 04/09/2024   PHOS 2.1 (L) 03/12/2022   PROT 6.8 04/06/2024   ALBUMIN 3.8 04/06/2024   LABGLOB 2.2 03/28/2024   BILITOT 0.8 04/06/2024   ALKPHOS 52 04/06/2024   AST 30 04/06/2024   ALT 29 04/06/2024   ANIONGAP 8 04/09/2024    GFR: Estimated Creatinine Clearance: 26.9 mL/min (A) (by C-G formula based on SCr of 3.12 mg/dL (H)).  Recent Results (from the past 240 hours)  Resp panel by RT-PCR (RSV, Flu A&B, Covid) Anterior Nasal Swab  Status: Abnormal   Collection Time: 04/06/24  5:29 PM   Specimen: Anterior Nasal Swab  Result Value Ref Range Status   SARS Coronavirus 2 by RT PCR POSITIVE (A) NEGATIVE Final    Comment: (NOTE) SARS-CoV-2 target nucleic acids are DETECTED.  The SARS-CoV-2 RNA is generally detectable in upper respiratory specimens during the acute phase of infection. Positive results are indicative of the presence of the identified virus, but do not rule out  bacterial infection or co-infection with other pathogens not detected by the test. Clinical correlation with patient history and other diagnostic information is necessary to determine patient infection status. The expected result is Negative.  Fact Sheet for Patients: BloggerCourse.com  Fact Sheet for Healthcare Providers: SeriousBroker.it  This test is not yet approved or cleared by the [place]  FDA and  has been authorized for detection and/or diagnosis of SARS-CoV-2 by FDA under an Emergency Use Authorization (EUA).  This EUA will remain in effect (meaning this test can be used) for the duration of  the COVID-19 declaration under Section 564(b)(1) of the A ct, 21 U.S.C. section 360bbb-3(b)(1), unless the authorization is terminated or revoked sooner.     Influenza A by PCR NEGATIVE NEGATIVE Final   Influenza B by PCR NEGATIVE NEGATIVE Final    Comment: (NOTE) The Xpert Xpress SARS-CoV-2/FLU/RSV plus assay is intended as an aid in the diagnosis of influenza from Nasopharyngeal swab specimens and should not be used as a sole basis for treatment. Nasal washings and aspirates are unacceptable for Xpert Xpress SARS-CoV-2/FLU/RSV testing.  Fact Sheet for Patients: BloggerCourse.com  Fact Sheet for Healthcare Providers: SeriousBroker.it  This test is not yet approved or cleared by the [place]  FDA and has been authorized for detection and/or diagnosis of SARS-CoV-2 by FDA under an Emergency Use Authorization (EUA). This EUA will remain in effect (meaning this test can be used) for the duration of the COVID-19 declaration under Section 564(b)(1) of the Act, 21 U.S.C. section 360bbb-3(b)(1), unless the authorization is terminated or revoked.     Resp Syncytial Virus by PCR NEGATIVE NEGATIVE Final    Comment: (NOTE) Fact Sheet for  Patients: BloggerCourse.com  Fact Sheet for Healthcare Providers: SeriousBroker.it  This test is not yet approved or cleared by the [place]  FDA and has been authorized for detection and/or diagnosis of SARS-CoV-2 by FDA under an Emergency Use Authorization (EUA). This EUA will remain in effect (meaning this test can be used) for the duration of the COVID-19 declaration under Section 564(b)(1) of the Act, 21 U.S.C. section 360bbb-3(b)(1), unless the authorization is terminated or revoked.  Performed at [facility], [address], [place], [place] [address]   Blood culture (routine x 2)     Status: None (Preliminary result)   Collection Time: 04/06/24  6:20 PM   Specimen: BLOOD LEFT HAND  Result Value Ref Range Status   Specimen Description   Final    BLOOD LEFT HAND Performed at [facility], [address] [place], [place] [address]    Special Requests   Final    BOTTLES DRAWN AEROBIC AND ANAEROBIC Blood Culture results may not be optimal due to an inadequate volume of blood received in culture bottles Performed at [facility], [address], [place], [place] [address]    Culture  Setup Time   Final    GRAM NEGATIVE RODS ANAEROBIC BOTTLE ONLY CRITICAL RESULT CALLED TO, READ BACK BY AND VERIFIED WITH: KYM JEFFERSON RN, AT [phone]  04/08/24 D. VANHOOK    Culture   Final    GRAM NEGATIVE RODS IDENTIFICATION TO FOLLOW Performed at [facility], [address], [place], [place] [address]    Report Status PENDING  Incomplete  Blood Culture ID Panel (Reflexed)     Status: None   Collection Time: 04/06/24  6:20 PM  Result Value Ref Range Status   Enterococcus faecalis NOT DETECTED NOT DETECTED Final   Enterococcus Faecium NOT DETECTED NOT DETECTED Final   Listeria monocytogenes NOT DETECTED NOT DETECTED Final   Staphylococcus species NOT DETECTED NOT DETECTED Final    Staphylococcus aureus (BCID) NOT DETECTED NOT DETECTED Final   Staphylococcus epidermidis NOT DETECTED NOT DETECTED Final   Staphylococcus lugdunensis NOT DETECTED NOT DETECTED Final   Streptococcus species NOT DETECTED NOT DETECTED Final   Streptococcus agalactiae NOT DETECTED NOT DETECTED Final   Streptococcus pneumoniae NOT DETECTED NOT DETECTED Final   Streptococcus pyogenes NOT DETECTED NOT DETECTED Final   A.calcoaceticus-baumannii NOT DETECTED NOT DETECTED Final   Bacteroides fragilis NOT DETECTED NOT DETECTED Final   Enterobacterales NOT DETECTED NOT DETECTED Final   Enterobacter cloacae complex NOT DETECTED NOT DETECTED Final   Escherichia coli NOT DETECTED NOT DETECTED Final   Klebsiella aerogenes NOT DETECTED NOT DETECTED Final   Klebsiella oxytoca NOT DETECTED NOT DETECTED Final   Klebsiella pneumoniae NOT DETECTED NOT DETECTED Final   Proteus species NOT DETECTED NOT DETECTED Final   Salmonella species NOT DETECTED NOT DETECTED Final   Serratia marcescens NOT DETECTED NOT DETECTED Final   Haemophilus influenzae NOT DETECTED NOT DETECTED Final   Neisseria meningitidis NOT DETECTED NOT DETECTED Final   Pseudomonas aeruginosa NOT DETECTED NOT DETECTED Final   Stenotrophomonas maltophilia NOT DETECTED NOT DETECTED Final   Candida albicans NOT DETECTED NOT DETECTED Final   Candida auris NOT DETECTED NOT DETECTED Final   Candida glabrata NOT DETECTED NOT DETECTED Final   Candida krusei NOT DETECTED NOT DETECTED Final   Candida parapsilosis NOT DETECTED NOT DETECTED Final   Candida tropicalis NOT DETECTED NOT DETECTED Final   Cryptococcus neoformans/gattii NOT DETECTED NOT DETECTED Final    Comment: Performed at [facility], [address], [place], [place] [address]  Blood culture (routine x 2)     Status: None (Preliminary result)   Collection Time: 04/06/24  6:24 PM   Specimen: BLOOD  Result Value Ref Range Status   Specimen Description   Final    BLOOD RIGHT  ANTECUBITAL Performed at [facility], [address] [place], [place] [address]    Special Requests   Final    BOTTLES DRAWN AEROBIC AND ANAEROBIC Blood Culture results may not be optimal due to an inadequate volume of blood received in culture bottles Performed at [facility], [address] [place], [place] [address]    Culture   Final    NO GROWTH 3 DAYS Performed at [facility], [address] [place], [place] [address]    Report Status PENDING  Incomplete  Urine Culture     Status: Abnormal   Collection Time: 04/06/24  8:55 PM   Specimen: Urine, Clean Catch  Result Value Ref Range Status   Specimen Description   Final    URINE, CLEAN CATCH Performed at [facility], [address], [place], [place] [address]    Special Requests   Final    NONE Performed at [facility], [address] [place],  [place] [address]    Culture (A)  Final    <10,000 COLONIES/mL INSIGNIFICANT GROWTH Performed at [facility], [address], [place], [place] [address]    Report Status 04/08/2024 FINAL  Final  Culture, blood (routine x 2)     Status: None (Preliminary result)   Collection Time: 04/08/24  9:58 AM   Specimen: BLOOD RIGHT HAND  Result Value Ref Range Status   Specimen Description   Final    BLOOD RIGHT HAND Performed at [facility], [address] [place], [place] [address]    Special Requests   Final    BOTTLES DRAWN AEROBIC AND ANAEROBIC Blood Culture adequate volume Performed at [facility], [address], [place], [place] [address]    Culture   Final    NO GROWTH < 24 HOURS Performed at [facility], [address], [place], [place] [address]    Report Status PENDING  Incomplete  Culture, blood (routine x 2)     Status: None (Preliminary result)   Collection Time: 04/08/24 10:17 AM   Specimen: BLOOD  Result Value Ref Range Status   Specimen  Description   Final    BLOOD RIGHT ANTECUBITAL Performed at [facility], [address], [place], [place] [address]    Special Requests   Final    BOTTLES DRAWN AEROBIC AND ANAEROBIC Blood Culture adequate volume Performed at [facility], [address], [place], [place] [address]    Culture   Final    NO GROWTH < 24 HOURS Performed at [facility], [address], [place], [place] [address]    Report Status PENDING  Incomplete      Radiology Studies: No results found.    LOS: 0 days    Elgin Lam, MD [facility] 04/09/2024, 2:44 PM   If 7PM-7AM, please contact night-coverage [email or web address]

## 2024-04-09 NOTE — Hospital Course (Signed)
 Darren Hinton. is a 78 y.o. male with a history of multiple myeloma, paroxysmal atrial fibrillation, CKD IV.  Patient presented secondary to a positive blood culture result requiring IV antibiotics.

## 2024-04-09 NOTE — Plan of Care (Signed)
  Problem: Respiratory: Goal: Will maintain a patent airway Outcome: Progressing Goal: Complications related to the disease process, condition or treatment will be avoided or minimized Outcome: Progressing   Problem: Education: Goal: Knowledge of General Education information will improve Description: Including pain rating scale, medication(s)/side effects and non-pharmacologic comfort measures Outcome: Progressing   

## 2024-04-10 DIAGNOSIS — R7881 Bacteremia: Secondary | ICD-10-CM | POA: Diagnosis not present

## 2024-04-10 NOTE — Progress Notes (Signed)
 PROGRESS NOTE    Darren Hinton.  FMW:[id number] DOB: 1946-05-20 DOA: 04/08/2024 PCP: [name], MD   Brief Narrative: Darren Finklea. is a 78 y.o. male with a history of multiple myeloma, paroxysmal atrial fibrillation, CKD IV.  Patient presented secondary to a positive blood culture result requiring IV antibiotics.   Assessment and Plan:  Gram negative rod bacteremia Noted from prior blood culture sample from 7/30. No source identified. No urinary or abdominal symptoms. Patient started empirically on Ceftriaxone  IV. Repeat blood cultures (8/1) obtained on admission. -Continue Ceftriaxone  IV -Follow-up blood culture data  COVID-19 infection Asymptomatic. Patient started on molnupiravir  as an outpatient. -Continue molnupiravir   Paroxysmal atrial fibrillation Patient is on amiodarone , Toprol  XL and Eliquis . Toprol  XL held on admission. Patient with bradycardia overnight. -Continue amiodarone  and Eliquis  -Continue Toprol  XL at decreased dose of 12.5 mg daily  AKI on CKD IV Baseline creatinine appears to be around 2.5-2.7. Creatinine of 3.32 on admission. Improved to 3.12.   Multiple myeloma Noted. Patient follows with medical oncology as an outpatient and is on Velcade .   DVT prophylaxis: Eliquis  Code Status:   Code Status: Full Code Family Communication: Wife at bedside Disposition Plan: Discharge home likely in 1 day pending culture data and transition to oral antibiotics   Consultants:  None  Procedures:  None  Antimicrobials: Ceftriaxone     Subjective: No issues overnight or this morning. Ready to discharge when able.  Objective: BP 113/65 (BP Location: Left Arm)   Pulse 80   Temp 98.4 F (36.9 C) (Oral)   Resp 16   Ht 6' 5 (1.956 m)   Wt 110 kg   SpO2 98%   BMI 28.76 kg/m   Examination:  General exam: Appears calm and comfortable Respiratory system: Clear to auscultation. Respiratory effort normal. Cardiovascular system: S1 &  S2 heard, RRR. No murmurs, rubs, gallops or clicks. Gastrointestinal system: Abdomen is nondistended, soft and nontender. Normal bowel sounds heard. Central nervous system: Alert and oriented. No focal neurological deficits. Psychiatry: Judgement and insight appear normal. Mood & affect appropriate.    Data Reviewed: I have personally reviewed following labs and imaging studies  CBC Lab Results  Component Value Date   WBC 4.7 04/09/2024   RBC 2.84 (L) 04/09/2024   HGB 9.1 (L) 04/09/2024   HCT 30.4 (L) 04/09/2024   MCV 107.0 (H) 04/09/2024   MCH 32.0 04/09/2024   PLT 145 (L) 04/09/2024   MCHC 29.9 (L) 04/09/2024   RDW 14.8 04/09/2024   LYMPHSABS 0.9 04/08/2024   MONOABS 1.0 04/08/2024   EOSABS 0.4 04/08/2024   BASOSABS 0.0 04/08/2024     Last metabolic panel Lab Results  Component Value Date   NA 136 04/09/2024   K 4.1 04/09/2024   CL 109 04/09/2024   CO2 19 (L) 04/09/2024   BUN 28 (H) 04/09/2024   CREATININE 3.12 (H) 04/09/2024   GLUCOSE 84 04/09/2024   GFRNONAA 20 (L) 04/09/2024   GFRAA 68 (L) 03/12/2014   CALCIUM 8.3 (L) 04/09/2024   PHOS 2.1 (L) 03/12/2022   PROT 6.8 04/06/2024   ALBUMIN 3.8 04/06/2024   LABGLOB 2.2 03/28/2024   BILITOT 0.8 04/06/2024   ALKPHOS 52 04/06/2024   AST 30 04/06/2024   ALT 29 04/06/2024   ANIONGAP 8 04/09/2024    GFR: Estimated Creatinine Clearance: 26.9 mL/min (A) (by C-G formula based on SCr of 3.12 mg/dL (H)).  Recent Results (from the past 240 hours)  Resp panel by RT-PCR (  RSV, Flu A&B, Covid) Anterior Nasal Swab     Status: Abnormal   Collection Time: 04/06/24  5:29 PM   Specimen: Anterior Nasal Swab  Result Value Ref Range Status   SARS Coronavirus 2 by RT PCR POSITIVE (A) NEGATIVE Final    Comment: (NOTE) SARS-CoV-2 target nucleic acids are DETECTED.  The SARS-CoV-2 RNA is generally detectable in upper respiratory specimens during the acute phase of infection. Positive results are indicative of the presence of the  identified virus, but do not rule out bacterial infection or co-infection with other pathogens not detected by the test. Clinical correlation with patient history and other diagnostic information is necessary to determine patient infection status. The expected result is Negative.  Fact Sheet for Patients: BloggerCourse.com  Fact Sheet for Healthcare Providers: SeriousBroker.it  This test is not yet approved or cleared by the [place]  FDA and  has been authorized for detection and/or diagnosis of SARS-CoV-2 by FDA under an Emergency Use Authorization (EUA).  This EUA will remain in effect (meaning this test can be used) for the duration of  the COVID-19 declaration under Section 564(b)(1) of the A ct, 21 U.S.C. section 360bbb-3(b)(1), unless the authorization is terminated or revoked sooner.     Influenza A by PCR NEGATIVE NEGATIVE Final   Influenza B by PCR NEGATIVE NEGATIVE Final    Comment: (NOTE) The Xpert Xpress SARS-CoV-2/FLU/RSV plus assay is intended as an aid in the diagnosis of influenza from Nasopharyngeal swab specimens and should not be used as a sole basis for treatment. Nasal washings and aspirates are unacceptable for Xpert Xpress SARS-CoV-2/FLU/RSV testing.  Fact Sheet for Patients: BloggerCourse.com  Fact Sheet for Healthcare Providers: SeriousBroker.it  This test is not yet approved or cleared by the [place]  FDA and has been authorized for detection and/or diagnosis of SARS-CoV-2 by FDA under an Emergency Use Authorization (EUA). This EUA will remain in effect (meaning this test can be used) for the duration of the COVID-19 declaration under Section 564(b)(1) of the Act, 21 U.S.C. section 360bbb-3(b)(1), unless the authorization is terminated or revoked.     Resp Syncytial Virus by PCR NEGATIVE NEGATIVE Final    Comment: (NOTE) Fact Sheet for  Patients: BloggerCourse.com  Fact Sheet for Healthcare Providers: SeriousBroker.it  This test is not yet approved or cleared by the [place]  FDA and has been authorized for detection and/or diagnosis of SARS-CoV-2 by FDA under an Emergency Use Authorization (EUA). This EUA will remain in effect (meaning this test can be used) for the duration of the COVID-19 declaration under Section 564(b)(1) of the Act, 21 U.S.C. section 360bbb-3(b)(1), unless the authorization is terminated or revoked.  Performed at [facility], [address], [place], [place] [address]   Blood culture (routine x 2)     Status: None (Preliminary result)   Collection Time: 04/06/24  6:20 PM   Specimen: BLOOD LEFT HAND  Result Value Ref Range Status   Specimen Description   Final    BLOOD LEFT HAND Performed at [facility], [address] [place], [place] [address]    Special Requests   Final    BOTTLES DRAWN AEROBIC AND ANAEROBIC Blood Culture results may not be optimal due to an inadequate volume of blood received in culture bottles Performed at [facility], [address] [place], [place] [address]    Culture  Setup Time   Final    GRAM NEGATIVE RODS ANAEROBIC BOTTLE ONLY CRITICAL RESULT CALLED TO,  READ BACK BY AND VERIFIED WITH: KYM JEFFERSON RN, AT [phone] 04/08/24 D. VANHOOK    Culture   Final    GRAM NEGATIVE RODS IDENTIFICATION AND SUSCEPTIBILITIES TO FOLLOW Performed at [facility], [address], [place], [place] [address]    Report Status PENDING  Incomplete  Blood Culture ID Panel (Reflexed)     Status: None   Collection Time: 04/06/24  6:20 PM  Result Value Ref Range Status   Enterococcus faecalis NOT DETECTED NOT DETECTED Final   Enterococcus Faecium NOT DETECTED NOT DETECTED Final   Listeria monocytogenes NOT DETECTED NOT DETECTED Final   Staphylococcus species NOT DETECTED NOT DETECTED  Final   Staphylococcus aureus (BCID) NOT DETECTED NOT DETECTED Final   Staphylococcus epidermidis NOT DETECTED NOT DETECTED Final   Staphylococcus lugdunensis NOT DETECTED NOT DETECTED Final   Streptococcus species NOT DETECTED NOT DETECTED Final   Streptococcus agalactiae NOT DETECTED NOT DETECTED Final   Streptococcus pneumoniae NOT DETECTED NOT DETECTED Final   Streptococcus pyogenes NOT DETECTED NOT DETECTED Final   A.calcoaceticus-baumannii NOT DETECTED NOT DETECTED Final   Bacteroides fragilis NOT DETECTED NOT DETECTED Final   Enterobacterales NOT DETECTED NOT DETECTED Final   Enterobacter cloacae complex NOT DETECTED NOT DETECTED Final   Escherichia coli NOT DETECTED NOT DETECTED Final   Klebsiella aerogenes NOT DETECTED NOT DETECTED Final   Klebsiella oxytoca NOT DETECTED NOT DETECTED Final   Klebsiella pneumoniae NOT DETECTED NOT DETECTED Final   Proteus species NOT DETECTED NOT DETECTED Final   Salmonella species NOT DETECTED NOT DETECTED Final   Serratia marcescens NOT DETECTED NOT DETECTED Final   Haemophilus influenzae NOT DETECTED NOT DETECTED Final   Neisseria meningitidis NOT DETECTED NOT DETECTED Final   Pseudomonas aeruginosa NOT DETECTED NOT DETECTED Final   Stenotrophomonas maltophilia NOT DETECTED NOT DETECTED Final   Candida albicans NOT DETECTED NOT DETECTED Final   Candida auris NOT DETECTED NOT DETECTED Final   Candida glabrata NOT DETECTED NOT DETECTED Final   Candida krusei NOT DETECTED NOT DETECTED Final   Candida parapsilosis NOT DETECTED NOT DETECTED Final   Candida tropicalis NOT DETECTED NOT DETECTED Final   Cryptococcus neoformans/gattii NOT DETECTED NOT DETECTED Final    Comment: Performed at [facility], [address] [place], [place] [address]  Blood culture (routine x 2)     Status: None (Preliminary result)   Collection Time: 04/06/24  6:24 PM   Specimen: BLOOD  Result Value Ref Range Status   Specimen Description   Final    BLOOD  RIGHT ANTECUBITAL Performed at [facility], [address], [place], [place] [address]    Special Requests   Final    BOTTLES DRAWN AEROBIC AND ANAEROBIC Blood Culture results may not be optimal due to an inadequate volume of blood received in culture bottles Performed at [facility], [address], [place], [place] [address]    Culture   Final    NO GROWTH 3 DAYS Performed at [facility], [address] [place], [place] [address]    Report Status PENDING  Incomplete  Urine Culture     Status: Abnormal   Collection Time: 04/06/24  8:55 PM   Specimen: Urine, Clean Catch  Result Value Ref Range Status   Specimen Description   Final    URINE, CLEAN CATCH Performed at [facility], [address] [place], [place] [address]    Special Requests   Final  NONE Performed at [facility], [address] [place], [place], [place] [address]    Culture (A)  Final    <10,000 COLONIES/mL INSIGNIFICANT GROWTH Performed at [facility], [address], [place], [place] [address]    Report Status 04/08/2024 FINAL  Final  Culture, blood (routine x 2)     Status: None (Preliminary result)   Collection Time: 04/08/24  9:58 AM   Specimen: BLOOD RIGHT HAND  Result Value Ref Range Status   Specimen Description   Final    BLOOD RIGHT HAND Performed at [facility], [address] [place], [place] [address]    Special Requests   Final    BOTTLES DRAWN AEROBIC AND ANAEROBIC Blood Culture adequate volume Performed at [facility], [address], [place], [place] [address]    Culture   Final    NO GROWTH < 24 HOURS Performed at [facility], [address], [place], [place] [address]    Report Status PENDING  Incomplete  Culture, blood (routine x 2)     Status: None (Preliminary result)   Collection Time: 04/08/24 10:17 AM   Specimen: BLOOD  Result Value Ref Range Status    Specimen Description   Final    BLOOD RIGHT ANTECUBITAL Performed at [facility], [address] [place], [place] [address]    Special Requests   Final    BOTTLES DRAWN AEROBIC AND ANAEROBIC Blood Culture adequate volume Performed at [facility], [address] [place], [place] [address]    Culture   Final    NO GROWTH < 24 HOURS Performed at [facility], [address], [place], [place] [address]    Report Status PENDING  Incomplete      Radiology Studies: No results found.    LOS: 1 day    Elgin Lam, MD [facility] 04/10/2024, 11:15 AM   If 7PM-7AM, please contact night-coverage [email or web address]

## 2024-04-10 NOTE — Plan of Care (Signed)

## 2024-04-10 NOTE — Plan of Care (Signed)
   Problem: Education: Goal: Knowledge of risk factors and measures for prevention of condition will improve Outcome: Progressing   Problem: Coping: Goal: Psychosocial and spiritual needs will be supported Outcome: Progressing   Problem: Respiratory: Goal: Will maintain a patent airway Outcome: Progressing

## 2024-04-11 ENCOUNTER — Inpatient Hospital Stay: Admitting: Hematology and Oncology

## 2024-04-11 ENCOUNTER — Encounter: Payer: Self-pay | Admitting: Hematology and Oncology

## 2024-04-11 ENCOUNTER — Inpatient Hospital Stay

## 2024-04-11 ENCOUNTER — Other Ambulatory Visit (HOSPITAL_COMMUNITY): Payer: Self-pay

## 2024-04-11 DIAGNOSIS — U071 COVID-19: Secondary | ICD-10-CM

## 2024-04-11 DIAGNOSIS — R7881 Bacteremia: Secondary | ICD-10-CM | POA: Diagnosis not present

## 2024-04-11 DIAGNOSIS — N179 Acute kidney failure, unspecified: Secondary | ICD-10-CM | POA: Insufficient documentation

## 2024-04-11 DIAGNOSIS — B9689 Other specified bacterial agents as the cause of diseases classified elsewhere: Secondary | ICD-10-CM

## 2024-04-11 LAB — CULTURE, BLOOD (ROUTINE X 2): Culture: NO GROWTH

## 2024-04-11 MED ORDER — TRAMADOL HCL 50 MG PO TABS: 50.0000 mg | ORAL_TABLET | Freq: Two times a day (BID) | ORAL | Status: AC | PRN

## 2024-04-11 MED ORDER — METOPROLOL SUCCINATE ER 25 MG PO TB24: 12.5000 mg | ORAL_TABLET | Freq: Every day | ORAL | Status: AC

## 2024-04-11 MED ORDER — CIPROFLOXACIN HCL 500 MG PO TABS
500.0000 mg | ORAL_TABLET | Freq: Every day | ORAL | 0 refills | Status: AC
  Filled 2024-04-11: qty 6, 6d supply, fill #0

## 2024-04-11 MED ORDER — SODIUM CHLORIDE 0.9 % IV SOLN
2.0000 g | Freq: Once | INTRAVENOUS | Status: AC
  Administered 2024-04-11: 2 g via INTRAVENOUS
  Filled 2024-04-11: qty 12.5

## 2024-04-11 MED ORDER — MOLNUPIRAVIR EUA 200MG CAPSULE: 4.0000 | ORAL_CAPSULE | Freq: Two times a day (BID) | ORAL | Status: AC

## 2024-04-11 NOTE — Progress Notes (Unsigned)
 Hospitalized

## 2024-04-11 NOTE — Progress Notes (Signed)
 Discharge med in a secure bag delivered to charge nurse at desk by this RN

## 2024-04-11 NOTE — Progress Notes (Signed)
 AVS reviewed with patient, and his wife at bedside. All questions answered, and patient verbalized understanding. IV removed per order without complications. Patient to be discharged via vehicle. Patient escorted to main entrance via WC by staff.

## 2024-04-11 NOTE — Progress Notes (Signed)
   04/11/24 1225  TOC Brief Assessment  Insurance and Status Reviewed  Patient has primary care physician Yes  Home environment has been reviewed home  Prior level of function: independent  Prior/Current Home Services No current home services  Social Drivers of Health Review SDOH reviewed no interventions necessary  Readmission risk has been reviewed Yes  Transition of care needs no transition of care needs at this time

## 2024-04-11 NOTE — Discharge Instructions (Addendum)
 Darren Hinton.,  You were in the hospital because of a positive blood culture. You required IV antibiotics. Your blood culture shows a bacteria called Acinetobacter Ursingii. You will need to be on continued antibiotics on discharge. Please also continue your medication regimen for your COVID-19 infection.

## 2024-04-11 NOTE — Discharge Summary (Signed)
 Physician Discharge Summary   Patient: Darren Hinton. MRN: [id number] DOB: 08/15/1946  Admit date:     04/08/2024  Discharge date: 04/11/24  Discharge Physician: Elgin Lam, MD   PCP: [name], MD   Recommendations at discharge:  PCP visit for hospital follow-up  Discharge Diagnoses: Principal Problem:   Bacteremia Active Problems:   Atrial fibrillation with RVR (HCC)   AKI (acute kidney injury) (HCC)   COVID-19 virus infection  Resolved Problems:   * No resolved hospital problems. *  Hospital Course: Darren Hinton. is a 78 y.o. male with a history of multiple myeloma, paroxysmal atrial fibrillation, CKD IV.  Patient presented secondary to a positive blood culture result requiring IV antibiotics.  Assessment and Plan:  Acinetobacter Ursingii bacteremia Noted from prior blood culture sample from 7/30. No source identified. No urinary or abdominal symptoms. Patient started empirically on Ceftriaxone  IV. Repeat blood cultures (8/1) obtained on admission. Culture (7/30) growing Acinetobacter Ursingii. ID consulted and recommended Cefepime  IV x1 followed by 6 days of ciprofloxacin  on discharge.   COVID-19 infection Asymptomatic. Patient started on molnupiravir  as an outpatient. Continue molnupiravir .   Paroxysmal atrial fibrillation Patient is on amiodarone , Toprol  XL and Eliquis . Toprol  XL held on admission. Patient with bradycardia. Overnight and while awake. Decrease to Toprol  XL 12.5 mg daily. Continue amiodarone  and Eliquis .   AKI on CKD IV Baseline creatinine appears to be around 2.5-2.7. Creatinine of 3.32 on admission. Improved to 3.12.    Multiple myeloma Noted. Patient follows with medical oncology as an outpatient and is on Velcade .  Consultants: Infectious disease Procedures performed: None  Disposition: Home Diet recommendation: Renal diet   DISCHARGE MEDICATION: Allergies as of 04/11/2024       Reactions   Zithromax  [azithromycin ]  Nausea And Vomiting, Other (See Comments)   Sick on the stomach        Medication List     TAKE these medications    acyclovir  400 MG tablet Commonly known as: ZOVIRAX  TAKE 1 TABLET BY MOUTH TWICE A DAY   allopurinol  300 MG tablet Commonly known as: ZYLOPRIM  TAKE 1 TABLET BY MOUTH EVERY DAY   ALPRAZolam  0.25 MG tablet Commonly known as: XANAX  Take 0.25 mg by mouth 2 (two) times daily as needed for anxiety.   amiodarone  200 MG tablet Commonly known as: PACERONE  TAKE 1 TABLET BY MOUTH EVERY DAY   apixaban  5 MG Tabs tablet Commonly known as: ELIQUIS  Take 1 tablet (5 mg total) by mouth 2 (two) times daily.   benzonatate  100 MG capsule Commonly known as: TESSALON  Take 1 capsule (100 mg total) by mouth every 8 (eight) hours.   ciprofloxacin  500 MG tablet Commonly known as: CIPRO  Take 1 tablet (500 mg total) by mouth daily with breakfast for 6 days. Start taking on: April 12, 2024   cyanocobalamin  500 MCG tablet Commonly known as: VITAMIN B12 Take 500 mcg by mouth in the morning.   dexamethasone  4 MG tablet Commonly known as: DECADRON  TAKE 10 TABLETS (40 MG TOTAL) BY MOUTH ONCE A WEEK IN THE MORNING ON CHEMOTHERAPY DAYS What changed: See the new instructions.   ferrous gluconate  324 MG tablet Commonly known as: FERGON Take 1 tablet (324 mg total) by mouth 3 (three) times daily with meals.   fluticasone 50 MCG/ACT nasal spray Commonly known as: FLONASE Place 2 sprays into the nose daily as needed for allergies.   gabapentin  300 MG capsule Commonly known as: NEURONTIN  TAKE 1 CAPSULE BY MOUTH TWICE  A DAY   guaiFENesin  600 MG 12 hr tablet Commonly known as: MUCINEX  Take 600 mg by mouth 2 (two) times daily as needed for cough or to loosen phlegm.   levothyroxine  125 MCG tablet Commonly known as: SYNTHROID  Take 125 mcg by mouth daily before breakfast. Every day except on Mondays   loratadine 10 MG tablet Commonly known as: CLARITIN Take 10 mg by mouth daily  as needed for allergies.   meclizine  25 MG tablet Commonly known as: ANTIVERT  Take 1 tablet (25 mg total) by mouth 3 (three) times daily as needed for dizziness.   metoprolol  succinate 25 MG 24 hr tablet Commonly known as: TOPROL -XL Take 0.5 tablets (12.5 mg total) by mouth daily. Take with or immediately following a meal. What changed: how much to take   molnupiravir  EUA 200 mg Caps capsule Commonly known as: LAGEVRIO  Take 4 capsules (800 mg total) by mouth 2 (two) times daily for 1 day.   Olmesartan-amLODIPine -HCTZ 20-5-12.5 MG Tabs Take 1 tablet by mouth daily.   rosuvastatin 20 MG tablet Commonly known as: CRESTOR Take 20 mg by mouth daily.   senna-docusate 8.6-50 MG tablet Commonly known as: Senokot-S Take 1 tablet by mouth at bedtime as needed for mild constipation.   traMADol  50 MG tablet Commonly known as: ULTRAM  Take 1 tablet (50 mg total) by mouth every 12 (twelve) hours as needed. What changed: when to take this        Follow-up Information     [name], MD. Schedule an appointment as soon as possible for a visit in 1 week(s).   Specialty: Internal Medicine Why: For hospital follow-up Contact information: [address] [place] [address] [phone]                Discharge Exam: BP 124/63 (BP Location: Left Arm)   Pulse (!) 52   Temp 98 F (36.7 C)   Resp 17   Ht 6' 5 (1.956 m)   Wt 110 kg   SpO2 96%   BMI 28.76 kg/m   General exam: Appears calm and comfortable Respiratory system: Respiratory effort normal. Central nervous system: Alert and oriented. No focal neurological deficits. Psychiatry: Judgement and insight appear normal. Mood & affect appropriate.   Condition at discharge: stable  The results of significant diagnostics from this hospitalization (including imaging, microbiology, ancillary and laboratory) are listed below for reference.   Imaging Studies: DG Chest 2 View Result Date: 04/06/2024 CLINICAL  DATA:  Shortness of breath, COVID positive EXAM: CHEST - 2 VIEW COMPARISON:  03/12/2022 FINDINGS: Mild hyperinflation. Heart and mediastinal contours are within normal limits. No focal opacities or effusions. No acute bony abnormality. IMPRESSION: No active cardiopulmonary disease. Electronically Signed   By: Franky Crease M.D.   On: 04/06/2024 18:15    Microbiology: Results for orders placed or performed during the hospital encounter of 04/08/24  Culture, blood (routine x 2)     Status: None (Preliminary result)   Collection Time: 04/08/24  9:58 AM   Specimen: BLOOD RIGHT HAND  Result Value Ref Range Status   Specimen Description   Final    BLOOD RIGHT HAND Performed at [facility], [address] [place], [place] [address]    Special Requests   Final    BOTTLES DRAWN AEROBIC AND ANAEROBIC Blood Culture adequate volume Performed at [facility], [address], [place], [place] [address]    Culture   Final    NO GROWTH 3 DAYS  Performed at [facility], [address], [place], [place] [address]    Report Status PENDING  Incomplete  Culture, blood (routine x 2)     Status: None (Preliminary result)   Collection Time: 04/08/24 10:17 AM   Specimen: BLOOD  Result Value Ref Range Status   Specimen Description   Final    BLOOD RIGHT ANTECUBITAL Performed at [facility], [address], [place], [place] [address]    Special Requests   Final    BOTTLES DRAWN AEROBIC AND ANAEROBIC Blood Culture adequate volume Performed at [facility], [address] [place], [place] [address]    Culture   Final    NO GROWTH 3 DAYS Performed at [facility], [address], [place], [place] [address]    Report Status PENDING  Incomplete    Labs: CBC: Recent Labs  Lab 04/06/24 1729 04/08/24 0958 04/09/24 0623  WBC 7.7 5.8 4.7  NEUTROABS 5.8 3.4  --   HGB 9.7* 9.7* 9.1*  HCT 30.7* 32.5* 30.4*  MCV 105.5* 105.9*  107.0*  PLT 115* 137* 145*   Basic Metabolic Panel: Recent Labs  Lab 04/06/24 2006 04/08/24 0958 04/09/24 0623  NA 134* 137 136  K 4.2 4.3 4.1  CL 104 109 109  CO2 21* 23 19*  GLUCOSE 94 93 84  BUN 26* 28* 28*  CREATININE 3.19* 3.32* 3.12*  CALCIUM 8.9 8.7* 8.3*   Liver Function Tests: Recent Labs  Lab 04/06/24 2006  AST 30  ALT 29  ALKPHOS 52  BILITOT 0.8  PROT 6.8  ALBUMIN 3.8    Discharge time spent: 35 minutes.  Signed: Elgin Lam, MD [facility] 04/11/2024

## 2024-04-11 NOTE — Progress Notes (Signed)
 Call to inpatient pharmacy to confirm patient does not have any TOC meds or home meds in the pharmacy. This RN spoke w/ D[place] in [employer]

## 2024-04-11 NOTE — Plan of Care (Signed)
  Problem: Education: Goal: Knowledge of risk factors and measures for prevention of condition will improve Outcome: Progressing   Problem: Respiratory: Goal: Will maintain a patent airway Outcome: Progressing   Problem: Education: Goal: Knowledge of General Education information will improve Description: Including pain rating scale, medication(s)/side effects and non-pharmacologic comfort measures Outcome: Progressing   Problem: Nutrition: Goal: Adequate nutrition will be maintained Outcome: Progressing   Problem: Coping: Goal: Level of anxiety will decrease Outcome: Progressing

## 2024-04-11 NOTE — Consult Note (Signed)
 NAME: Darren Hinton.  DOB: Sep 29, 1945  MRN: [id number]  Date/Time: 04/11/2024 10:45 AM  REQUESTING PROVIDER: Dr.Netty Subjective:  REASON FOR CONSULT: bacteremia ? Darren Hinton. is a 78 y.o. male with a history of Multiple myeloma, PAF, called back to the ED on 8/1 for positive blood culture Pt is on velcade  for MM and last received it along with steroids on 03/28/24 A few days he developed cough, weakness and saw his PCP who tested him for covid and was posiitve, there was concern for pneumonia and so he was sent to ED on 04/06/24.  Vitals in the ED on 7/30  04/06/24 16:57  BP 106/62  Temp 101 F (38.3 C) !  Pulse Rate 59 !  Resp 16  SpO2 94 %  Weight 242 lb 8.1 oz  Height 6' 5 (1.956 m)  BMI (Calculated) 28.75  Work up showed no pneumonia,    Latest Reference Range & Units 04/06/24 17:29  WBC 4.0 - 10.5 K/uL 7.7  Hemoglobin 13.0 - 17.0 g/dL 9.7 (L)  HCT 60.9 - 47.9 % 30.7 (L)  Platelets 150 - 400 K/uL 115 (L)  Blood culture sent- was prescribed molnupravir and discharged HE was called back on 8/1 as blood culture came positive  04/08/24  BP 122/82  Temp 98 F (36.7 C)  Pulse Rate 47 (L)  Resp 16  SpO2 100 %    Latest Reference Range & Units 04/08/24  WBC 4.0 - 10.5 K/uL 5.8  Hemoglobin 13.0 - 17.0 g/dL 9.7 (L)  HCT 60.9 - 47.9 % 32.5 (L)  Platelets 150 - 400 K/uL 137 (L)  Creatinine 0.61 - 1.24 mg/dL 6.67 (H)   Repeat blood culture sent and started on ceftriaxone  IV I am seeing him for acinetobacter in blood culture Pt is doing well No fever Cough better No breathign issues No pain abdomen No dysuria Wife at bedside He has lumbar radiculopathy and peripheral neuroipathy  Past Medical History:  Diagnosis Date   Allergy    Anxiety    Arthritis    Depression    Heart murmur    Hyperlipidemia    Hyperplastic colon polyp    Hyperproteinemia    Hypertension    Hypothyroidism    Kidney disease    Mitral valve prolapse    Multiple myeloma (HCC)     Paroxysmal atrial fibrillation (HCC)    Thyroid disease     Past Surgical History:  Procedure Laterality Date   CARDIOVERSION N/A 03/14/2022   Procedure: CARDIOVERSION;  Surgeon: Mona Vinie BROCKS, MD;  Location: [facility] ENDOSCOPY;  Service: Cardiovascular;  Laterality: N/A;   TEE WITHOUT CARDIOVERSION N/A 03/14/2022   Procedure: TRANSESOPHAGEAL ECHOCARDIOGRAM (TEE);  Surgeon: Mona Vinie BROCKS, MD;  Location: [facility] ENDOSCOPY;  Service: Cardiovascular;  Laterality: N/A;    Social History   Socioeconomic History   Marital status: Married    Spouse name: [name]   Number of children: 2   Years of education: Not on file   Highest education level: Not on file  Occupational History   Occupation: retired  Tobacco Use   Smoking status: Former    Current packs/day: 0.00    Types: Cigarettes    Start date: 10/01/1958    Quit date: 10/01/1978    Years since quitting: 45.5   Smokeless tobacco: Never   Tobacco comments:    Former smoker 04/03/22  Vaping Use   Vaping status: Never Used  Substance and Sexual Activity   Alcohol use: Not Currently  Alcohol/week: 2.0 standard drinks of alcohol    Types: 2 Glasses of wine per week    Comment: None in many years   Drug use: No   Sexual activity: Not on file  Other Topics Concern   Not on file  Social History Narrative   Not on file   Social Drivers of Health   Financial Resource Strain: Not on file  Food Insecurity: No Food Insecurity (04/08/2024)   Hunger Vital Sign    Worried About Running Out of Food in the Last Year: Never true    Ran Out of Food in the Last Year: Never true  Transportation Needs: No Transportation Needs (04/08/2024)   PRAPARE - Administrator, Civil Service (Medical): No    Lack of Transportation (Non-Medical): No  Physical Activity: Not on file  Stress: Not on file  Social Connections: Socially Integrated (04/08/2024)   Social Connection and Isolation Panel    Frequency of Communication with Friends and Family:  More than three times a week    Frequency of Social Gatherings with Friends and Family: Twice a week    Attends Religious Services: More than 4 times per year    Active Member of [employer] or Organizations: Yes    Attends Banker Meetings: 1 to 4 times per year    Marital Status: Married  Catering manager Violence: Not At Risk (04/08/2024)   Humiliation, Afraid, Rape, and Kick questionnaire    Fear of Current or Ex-Partner: No    Emotionally Abused: No    Physically Abused: No    Sexually Abused: No    Family History  Problem Relation Age of Onset   Kidney disease Mother    Prostate cancer Father    Kidney disease Brother    Colon cancer Neg Hx    Esophageal cancer Neg Hx    Liver cancer Neg Hx    Pancreatic cancer Neg Hx    Rectal cancer Neg Hx    Stomach cancer Neg Hx    Allergies  Allergen Reactions   Zithromax  [Azithromycin ] Nausea And Vomiting and Other (See Comments)    Sick on the stomach   I? Current Facility-Administered Medications  Medication Dose Route Frequency Provider Last Rate Last Admin   acetaminophen  (TYLENOL ) tablet 650 mg  650 mg Oral Q6H PRN Zella, Mir M, MD       Or   acetaminophen  (TYLENOL ) suppository 650 mg  650 mg Rectal Q6H PRN Zella, Mir M, MD       acyclovir  (ZOVIRAX ) tablet 400 mg  400 mg Oral BID Chavez, Abigail, NP   400 mg at 04/11/24 9093   albuterol  (PROVENTIL ) (2.5 MG/3ML) 0.083% nebulizer solution 2.5 mg  2.5 mg Nebulization Q2H PRN Zella, Mir M, MD       allopurinol  (ZYLOPRIM ) tablet 300 mg  300 mg Oral Daily Chavez, Abigail, NP   300 mg at 04/11/24 9094   ALPRAZolam  (XANAX ) tablet 0.25 mg  0.25 mg Oral BID PRN Chavez, Abigail, NP   0.25 mg at 04/10/24 2129   amiodarone  (PACERONE ) tablet 200 mg  200 mg Oral Daily Chavez, Abigail, NP   200 mg at 04/11/24 9094   apixaban  (ELIQUIS ) tablet 5 mg  5 mg Oral BID Chavez, Abigail, NP   5 mg at 04/11/24 9094   benzonatate  (TESSALON ) capsule 100 mg  100 mg Oral TID PRN  Chavez, Abigail, NP   100 mg at 04/10/24 2041   cefTRIAXone  (ROCEPHIN ) 2 g in sodium  chloride 0.9 % 100 mL IVPB  2 g Intravenous Q24H Zella, Mir M, MD 200 mL/hr at 04/10/24 1157 2 g at 04/10/24 1157   gabapentin  (NEURONTIN ) capsule 300 mg  300 mg Oral BID Chavez, Abigail, NP   300 mg at 04/11/24 9093   metoprolol  succinate (TOPROL -XL) 24 hr tablet 12.5 mg  12.5 mg Oral Daily Briana Elgin LABOR, MD   12.5 mg at 04/11/24 9094   molnupiravir  EUA (LAGEVRIO ) capsule 800 mg  4 capsule Oral BID Zella, Mir M, MD   800 mg at 04/11/24 9093   ondansetron  (ZOFRAN ) tablet 4 mg  4 mg Oral Q6H PRN Zella Katha HERO, MD       Or   ondansetron  (ZOFRAN ) injection 4 mg  4 mg Intravenous Q6H PRN Zella Katha HERO, MD       traZODone  (DESYREL ) tablet 25 mg  25 mg Oral QHS PRN Zella Katha HERO, MD   25 mg at 04/10/24 2041     Abtx:  Anti-infectives (From admission, onward)    Start     Dose/Rate Route Frequency Ordered Stop   04/08/24 2200  acyclovir  (ZOVIRAX ) tablet 400 mg        400 mg Oral 2 times daily 04/08/24 2023     04/08/24 1400  molnupiravir  EUA (LAGEVRIO ) capsule 800 mg        4 capsule Oral 2 times daily 04/08/24 1240 04/13/24 0959   04/08/24 1200  cefTRIAXone  (ROCEPHIN ) 2 g in sodium chloride  0.9 % 100 mL IVPB        2 g 200 mL/hr over 30 Minutes Intravenous Every 24 hours 04/08/24 1149     04/08/24 1145  cefTRIAXone  (ROCEPHIN ) 1 g in sodium chloride  0.9 % 100 mL IVPB  Status:  Discontinued        1 g 200 mL/hr over 30 Minutes Intravenous  Once 04/08/24 1137 04/08/24 1155       REVIEW OF SYSTEMS:  Const: negative fever, negative chills, negative weight loss Eyes: negative diplopia or visual changes, negative eye pain ENT: negative coryza, negative sore throat Resp: negative cough, hemoptysis, dyspnea Cards: negative for chest pain, palpitations, lower extremity edema GU: negative for frequency, dysuria and hematuria GI: Negative for abdominal pain, diarrhea, bleeding,  constipation Skin: negative for rash and pruritus Heme: negative for easy bruising and gum/nose bleeding MS: negative for myalgias, arthralgias, back pain and muscle weakness Neurolo:negative for headaches, dizziness, vertigo, memory problems  Psych: negative for feelings of anxiety, depression  Endocrine: negative for thyroid, diabetes Allergy/Immunology- negative for any medication or food allergies ? Pertinent Positives include : Objective:  VITALS:  BP (!) 101/59   Pulse (!) 49   Temp 98 F (36.7 C)   Resp 20   Ht 6' 5 (1.956 m)   Wt 110 kg   SpO2 98%   BMI 28.76 kg/m  LDA Foley Central line Other drainage tubes PHYSICAL EXAM:  General: Alert, cooperative, no distress, appears stated age.  Head: Normocephalic, without obvious abnormality, atraumatic. Eyes: Conjunctivae clear, anicteric sclerae. Pupils are equal ENT Nares normal. No drainage or sinus tenderness. Lips, mucosa, and tongue normal. No Thrush Neck: Supple, symmetrical, no adenopathy, thyroid: non tender no carotid bruit and no JVD. Back: No CVA tenderness. Lungs: Clear to auscultation bilaterally. No Wheezing or Rhonchi. No rales. Heart: Regular rate and rhythm, no murmur, rub or gallop. Abdomen: Soft, non-tender,not distended. Bowel sounds normal. No masses Extremities: atraumatic, no cyanosis. No edema. No clubbing Skin: hypopigmentaion left leg Lymph:  Cervical, supraclavicular normal. Neurologic: Grossly non-focal Pertinent Labs Lab Results CBC    Component Value Date/Time   WBC 4.7 04/09/2024 0623   RBC 2.84 (L) 04/09/2024 0623   HGB 9.1 (L) 04/09/2024 0623   HGB 10.4 (L) 03/28/2024 0831   HGB 12.1 (L) 04/26/2015 0929   HCT 30.4 (L) 04/09/2024 0623   HCT 36.5 (L) 04/26/2015 0929   PLT 145 (L) 04/09/2024 0623   PLT 242 03/28/2024 0831   PLT 238 04/26/2015 0929   MCV 107.0 (H) 04/09/2024 0623   MCV 95.8 04/26/2015 0929   MCH 32.0 04/09/2024 0623   MCHC 29.9 (L) 04/09/2024 0623   RDW 14.8  04/09/2024 0623   RDW 15.7 (H) 04/26/2015 0929   LYMPHSABS 0.9 04/08/2024 0958   LYMPHSABS 1.8 04/26/2015 0929   MONOABS 1.0 04/08/2024 0958   MONOABS 0.6 04/26/2015 0929   EOSABS 0.4 04/08/2024 0958   EOSABS 0.4 04/26/2015 0929   BASOSABS 0.0 04/08/2024 0958   BASOSABS 0.1 04/26/2015 0929       Latest Ref Rng & Units 04/09/2024    6:23 AM 04/08/2024    9:58 AM 04/06/2024    8:06 PM  CMP  Glucose 70 - 99 mg/dL 84  93  94   BUN 8 - 23 mg/dL 28  28  26    Creatinine 0.61 - 1.24 mg/dL 6.87  6.67  6.80   Sodium 135 - 145 mmol/L 136  137  134   Potassium 3.5 - 5.1 mmol/L 4.1  4.3  4.2   Chloride 98 - 111 mmol/L 109  109  104   CO2 22 - 32 mmol/L 19  23  21    Calcium 8.9 - 10.3 mg/dL 8.3  8.7  8.9   Total Protein 6.5 - 8.1 g/dL   6.8   Total Bilirubin 0.0 - 1.2 mg/dL   0.8   Alkaline Phos 38 - 126 U/L   52   AST 15 - 41 U/L   30   ALT 0 - 44 U/L   29       Microbiology: Recent Results (from the past 240 hours)  Resp panel by RT-PCR (RSV, Flu A&B, Covid) Anterior Nasal Swab     Status: Abnormal   Collection Time: 04/06/24  5:29 PM   Specimen: Anterior Nasal Swab  Result Value Ref Range Status   SARS Coronavirus 2 by RT PCR POSITIVE (A) NEGATIVE Final    Comment: (NOTE) SARS-CoV-2 target nucleic acids are DETECTED.  The SARS-CoV-2 RNA is generally detectable in upper respiratory specimens during the acute phase of infection. Positive results are indicative of the presence of the identified virus, but do not rule out bacterial infection or co-infection with other pathogens not detected by the test. Clinical correlation with patient history and other diagnostic information is necessary to determine patient infection status. The expected result is Negative.  Fact Sheet for Patients: BloggerCourse.com  Fact Sheet for Healthcare Providers: SeriousBroker.it  This test is not yet approved or cleared by the [place]  FDA and   has been authorized for detection and/or diagnosis of SARS-CoV-2 by FDA under an Emergency Use Authorization (EUA).  This EUA will remain in effect (meaning this test can be used) for the duration of  the COVID-19 declaration under Section 564(b)(1) of the A ct, 21 U.S.C. section 360bbb-3(b)(1), unless the authorization is terminated or revoked sooner.     Influenza A by PCR NEGATIVE NEGATIVE Final   Influenza B by PCR NEGATIVE NEGATIVE Final  Comment: (NOTE) The Xpert Xpress SARS-CoV-2/FLU/RSV plus assay is intended as an aid in the diagnosis of influenza from Nasopharyngeal swab specimens and should not be used as a sole basis for treatment. Nasal washings and aspirates are unacceptable for Xpert Xpress SARS-CoV-2/FLU/RSV testing.  Fact Sheet for Patients: BloggerCourse.com  Fact Sheet for Healthcare Providers: SeriousBroker.it  This test is not yet approved or cleared by the [place]  FDA and has been authorized for detection and/or diagnosis of SARS-CoV-2 by FDA under an Emergency Use Authorization (EUA). This EUA will remain in effect (meaning this test can be used) for the duration of the COVID-19 declaration under Section 564(b)(1) of the Act, 21 U.S.C. section 360bbb-3(b)(1), unless the authorization is terminated or revoked.     Resp Syncytial Virus by PCR NEGATIVE NEGATIVE Final    Comment: (NOTE) Fact Sheet for Patients: BloggerCourse.com  Fact Sheet for Healthcare Providers: SeriousBroker.it  This test is not yet approved or cleared by the [place]  FDA and has been authorized for detection and/or diagnosis of SARS-CoV-2 by FDA under an Emergency Use Authorization (EUA). This EUA will remain in effect (meaning this test can be used) for the duration of the COVID-19 declaration under Section 564(b)(1) of the Act, 21 U.S.C. section 360bbb-3(b)(1), unless  the authorization is terminated or revoked.  Performed at [facility], [address], [place], [place] [address]   Blood culture (routine x 2)     Status: Abnormal (Preliminary result)   Collection Time: 04/06/24  6:20 PM   Specimen: BLOOD LEFT HAND  Result Value Ref Range Status   Specimen Description   Final    BLOOD LEFT HAND Performed at [facility], [address], [place], [place] [address]    Special Requests   Final    BOTTLES DRAWN AEROBIC AND ANAEROBIC Blood Culture results may not be optimal due to an inadequate volume of blood received in culture bottles Performed at [facility], [address], [place], [place] [address]    Culture  Setup Time   Final    GRAM NEGATIVE RODS ANAEROBIC BOTTLE ONLY CRITICAL RESULT CALLED TO, READ BACK BY AND VERIFIED WITH: KYM JEFFERSON RN, AT [phone] 04/08/24 D. VANHOOK    Culture (A)  Final    ACINETOBACTER URSINGII CULTURE REINCUBATED FOR BETTER GROWTH Performed at [facility], [address], [place], [place] [address]    Report Status PENDING  Incomplete   Organism ID, Bacteria ACINETOBACTER URSINGII  Final      Susceptibility   Acinetobacter ursingii - MIC*    CEFTAZIDIME <=1 SENSITIVE Sensitive     CIPROFLOXACIN  <=0.25 SENSITIVE Sensitive     GENTAMICIN <=1 SENSITIVE Sensitive     IMIPENEM <=0.25 SENSITIVE Sensitive     TRIMETH/SULFA <=20 SENSITIVE Sensitive     AMPICILLIN/SULBACTAM <=2 SENSITIVE Sensitive     * ACINETOBACTER URSINGII  Blood Culture ID Panel (Reflexed)     Status: None   Collection Time: 04/06/24  6:20 PM  Result Value Ref Range Status   Enterococcus faecalis NOT DETECTED NOT DETECTED Final   Enterococcus Faecium NOT DETECTED NOT DETECTED Final   Listeria monocytogenes NOT DETECTED NOT DETECTED Final   Staphylococcus species NOT DETECTED NOT DETECTED Final   Staphylococcus aureus (BCID) NOT DETECTED NOT DETECTED Final   Staphylococcus epidermidis NOT  DETECTED NOT DETECTED Final   Staphylococcus lugdunensis NOT DETECTED NOT DETECTED Final   Streptococcus species NOT DETECTED NOT DETECTED Final   Streptococcus agalactiae NOT  DETECTED NOT DETECTED Final   Streptococcus pneumoniae NOT DETECTED NOT DETECTED Final   Streptococcus pyogenes NOT DETECTED NOT DETECTED Final   A.calcoaceticus-baumannii NOT DETECTED NOT DETECTED Final   Bacteroides fragilis NOT DETECTED NOT DETECTED Final   Enterobacterales NOT DETECTED NOT DETECTED Final   Enterobacter cloacae complex NOT DETECTED NOT DETECTED Final   Escherichia coli NOT DETECTED NOT DETECTED Final   Klebsiella aerogenes NOT DETECTED NOT DETECTED Final   Klebsiella oxytoca NOT DETECTED NOT DETECTED Final   Klebsiella pneumoniae NOT DETECTED NOT DETECTED Final   Proteus species NOT DETECTED NOT DETECTED Final   Salmonella species NOT DETECTED NOT DETECTED Final   Serratia marcescens NOT DETECTED NOT DETECTED Final   Haemophilus influenzae NOT DETECTED NOT DETECTED Final   Neisseria meningitidis NOT DETECTED NOT DETECTED Final   Pseudomonas aeruginosa NOT DETECTED NOT DETECTED Final   Stenotrophomonas maltophilia NOT DETECTED NOT DETECTED Final   Candida albicans NOT DETECTED NOT DETECTED Final   Candida auris NOT DETECTED NOT DETECTED Final   Candida glabrata NOT DETECTED NOT DETECTED Final   Candida krusei NOT DETECTED NOT DETECTED Final   Candida parapsilosis NOT DETECTED NOT DETECTED Final   Candida tropicalis NOT DETECTED NOT DETECTED Final   Cryptococcus neoformans/gattii NOT DETECTED NOT DETECTED Final    Comment: Performed at [facility], [address] [place], [place] [address]  Blood culture (routine x 2)     Status: None   Collection Time: 04/06/24  6:24 PM   Specimen: BLOOD  Result Value Ref Range Status   Specimen Description   Final    BLOOD RIGHT ANTECUBITAL Performed at [facility], [address], [place], [place] [address]    Special  Requests   Final    BOTTLES DRAWN AEROBIC AND ANAEROBIC Blood Culture results may not be optimal due to an inadequate volume of blood received in culture bottles Performed at [facility], [address] [place], [place] [address]    Culture   Final    NO GROWTH 5 DAYS Performed at [facility], [address] [place], [place] [address]    Report Status 04/11/2024 FINAL  Final  Urine Culture     Status: Abnormal   Collection Time: 04/06/24  8:55 PM   Specimen: Urine, Clean Catch  Result Value Ref Range Status   Specimen Description   Final    URINE, CLEAN CATCH Performed at [facility], [address] [place], [place] [address]    Special Requests   Final    NONE Performed at [facility], [address], [place], [place] [address]    Culture (A)  Final    <10,000 COLONIES/mL INSIGNIFICANT GROWTH Performed at [facility], [address], [place], [place] [address]    Report Status 04/08/2024 FINAL  Final  Culture, blood (routine x 2)     Status: None (Preliminary result)   Collection Time: 04/08/24  9:58 AM   Specimen: BLOOD RIGHT HAND  Result Value Ref Range Status   Specimen Description   Final    BLOOD RIGHT HAND Performed at [facility], [address], [place], [place] [address]    Special Requests   Final    BOTTLES DRAWN AEROBIC AND ANAEROBIC Blood Culture adequate volume Performed at [facility], [address] [place], [place] [address]    Culture   Final    NO GROWTH 3 DAYS Performed  at [facility], [address] [place], [place] [address]    Report Status PENDING  Incomplete  Culture, blood (routine x 2)     Status: None (Preliminary result)   Collection Time: 04/08/24 10:17 AM   Specimen: BLOOD  Result Value Ref Range Status   Specimen Description   Final    BLOOD RIGHT ANTECUBITAL Performed at [facility], [address] [place], [place] [address]    Special Requests   Final    BOTTLES DRAWN AEROBIC AND ANAEROBIC Blood Culture adequate volume Performed at [facility], [address], [place], [place] [address]    Culture   Final    NO GROWTH 3 DAYS Performed at [facility], [address] [place], [place] [address]    Report Status PENDING  Incomplete      IMAGING RESULTS:  I have personally reviewed the films ?no infiltrate   Impression/Recommendation ?Acinetobacter ursingii bacteremia- could be related to his immune compromised state and recent resp illness Susceptible to quinolone, bactrim and IV antibiotics He wants to take PO antibiotic So will prescribe cipro  adjsuted to crcl for 6 days Explained side effects including tendinitis and neuropathy and to stop and  contact me if he has worsening pain  Lab reported another organism likely moraxella- ID will be known tomorrow- ciprol should work  Recent covid- no pneumonia on molnupravir  Multiple myeloma on velcade  and steroids  CKD  Anemia  .    ________________________________________________ Discussed with patient, wife requesting provider

## 2024-04-12 ENCOUNTER — Encounter: Payer: Self-pay | Admitting: Hematology and Oncology

## 2024-04-12 LAB — CULTURE, BLOOD (ROUTINE X 2)

## 2024-04-13 ENCOUNTER — Telehealth (HOSPITAL_BASED_OUTPATIENT_CLINIC_OR_DEPARTMENT_OTHER): Payer: Self-pay | Admitting: *Deleted

## 2024-04-13 LAB — CULTURE, BLOOD (ROUTINE X 2)
Culture: NO GROWTH
Culture: NO GROWTH
Special Requests: ADEQUATE
Special Requests: ADEQUATE

## 2024-04-13 NOTE — Telephone Encounter (Signed)
 Post ED Visit - Positive Culture Follow-up  Culture report reviewed by antimicrobial stewardship pharmacist: [facility] []  Rankin Dee, Pharm.D. []  Venetia Gully, Pharm.D., BCPS AQ-ID []  Garrel Crews, Pharm.D., BCPS []  Almarie Lunger, Pharm.D., BCPS []  [place], [address].D., BCPS, AAHIVP []  Rosaline Bihari, Pharm.D., BCPS, AAHIVP []  Vernell Meier, PharmD, BCPS []  Latanya Hint, PharmD, BCPS []  Donald Medley, PharmD, BCPS []  Rocky Bold, PharmD []  Dorothyann Alert, PharmD, BCPS []  Morene Babe, PharmD  [facility] [x]  Izetta Carl, PharmD []  Romona Bliss, PharmD []  Dolphus Roller, PharmD []  Veva Seip, Rph []  Vernell Daunt) Leonce, PharmD []  Eva Allis, PharmD []  Rosaline Millet, PharmD []  Iantha Batch, PharmD []  Arvin Gauss, PharmD []  Wanda Hasting, PharmD []  Ronal Rav, PharmD []  Rocky Slade, PharmD []  Bard Jeans, PharmD   Positive Blood culture Patient seen by ID inpatient and discharged with Oral ciprofloxacin , organism sensitive to the same and no further patient follow-up is required at this time.  Jama Wyman Kipper 04/13/2024, 9:51 AM

## 2024-04-23 ENCOUNTER — Other Ambulatory Visit: Payer: Self-pay | Admitting: Hematology and Oncology

## 2024-04-24 ENCOUNTER — Encounter: Payer: Self-pay | Admitting: Hematology and Oncology

## 2024-04-25 ENCOUNTER — Ambulatory Visit

## 2024-04-25 ENCOUNTER — Inpatient Hospital Stay

## 2024-04-25 ENCOUNTER — Inpatient Hospital Stay: Attending: Hematology and Oncology

## 2024-04-25 ENCOUNTER — Inpatient Hospital Stay (HOSPITAL_BASED_OUTPATIENT_CLINIC_OR_DEPARTMENT_OTHER): Admitting: Hematology and Oncology

## 2024-04-25 ENCOUNTER — Other Ambulatory Visit

## 2024-04-25 VITALS — BP 122/64 | HR 53 | Temp 98.3°F | Resp 18 | Ht 72.5 in | Wt 242.1 lb

## 2024-04-25 DIAGNOSIS — Z7901 Long term (current) use of anticoagulants: Secondary | ICD-10-CM | POA: Insufficient documentation

## 2024-04-25 DIAGNOSIS — Z87891 Personal history of nicotine dependence: Secondary | ICD-10-CM | POA: Diagnosis not present

## 2024-04-25 DIAGNOSIS — G629 Polyneuropathy, unspecified: Secondary | ICD-10-CM | POA: Insufficient documentation

## 2024-04-25 DIAGNOSIS — U071 COVID-19: Secondary | ICD-10-CM | POA: Diagnosis not present

## 2024-04-25 DIAGNOSIS — Z860102 Personal history of hyperplastic colon polyps: Secondary | ICD-10-CM | POA: Insufficient documentation

## 2024-04-25 DIAGNOSIS — N184 Chronic kidney disease, stage 4 (severe): Secondary | ICD-10-CM

## 2024-04-25 DIAGNOSIS — Z79624 Long term (current) use of inhibitors of nucleotide synthesis: Secondary | ICD-10-CM | POA: Insufficient documentation

## 2024-04-25 DIAGNOSIS — Z79899 Other long term (current) drug therapy: Secondary | ICD-10-CM | POA: Diagnosis not present

## 2024-04-25 DIAGNOSIS — Z5112 Encounter for antineoplastic immunotherapy: Secondary | ICD-10-CM | POA: Insufficient documentation

## 2024-04-25 DIAGNOSIS — D649 Anemia, unspecified: Secondary | ICD-10-CM | POA: Diagnosis not present

## 2024-04-25 DIAGNOSIS — Z7969 Long term (current) use of other immunomodulators and immunosuppressants: Secondary | ICD-10-CM | POA: Diagnosis not present

## 2024-04-25 DIAGNOSIS — C9000 Multiple myeloma not having achieved remission: Secondary | ICD-10-CM | POA: Diagnosis present

## 2024-04-25 DIAGNOSIS — R7881 Bacteremia: Secondary | ICD-10-CM | POA: Diagnosis not present

## 2024-04-25 DIAGNOSIS — D631 Anemia in chronic kidney disease: Secondary | ICD-10-CM

## 2024-04-25 DIAGNOSIS — Z881 Allergy status to other antibiotic agents status: Secondary | ICD-10-CM | POA: Insufficient documentation

## 2024-04-25 LAB — CMP (CANCER CENTER ONLY)
ALT: 23 U/L (ref 0–44)
AST: 29 U/L (ref 15–41)
Albumin: 4.3 g/dL (ref 3.5–5.0)
Alkaline Phosphatase: 57 U/L (ref 38–126)
Anion gap: 7 (ref 5–15)
BUN: 26 mg/dL — ABNORMAL HIGH (ref 8–23)
CO2: 26 mmol/L (ref 22–32)
Calcium: 9.3 mg/dL (ref 8.9–10.3)
Chloride: 108 mmol/L (ref 98–111)
Creatinine: 2.85 mg/dL — ABNORMAL HIGH (ref 0.61–1.24)
GFR, Estimated: 22 mL/min — ABNORMAL LOW (ref >60)
Glucose, Bld: 93 mg/dL (ref 70–99)
Potassium: 4.1 mmol/L (ref 3.5–5.1)
Sodium: 141 mmol/L (ref 135–145)
Total Bilirubin: 0.4 mg/dL (ref 0.0–1.2)
Total Protein: 6.7 g/dL (ref 6.5–8.1)

## 2024-04-25 LAB — CBC WITH DIFFERENTIAL (CANCER CENTER ONLY)
Abs Immature Granulocytes: 0.01 K/uL (ref 0.00–0.07)
Basophils Absolute: 0.1 K/uL (ref 0.0–0.1)
Basophils Relative: 1 %
Eosinophils Absolute: 0.3 K/uL (ref 0.0–0.5)
Eosinophils Relative: 7 %
HCT: 30.7 % — ABNORMAL LOW (ref 39.0–52.0)
Hemoglobin: 9.9 g/dL — ABNORMAL LOW (ref 13.0–17.0)
Immature Granulocytes: 0 %
Lymphocytes Relative: 26 %
Lymphs Abs: 1.2 K/uL (ref 0.7–4.0)
MCH: 33.1 pg (ref 26.0–34.0)
MCHC: 32.2 g/dL (ref 30.0–36.0)
MCV: 102.7 fL — ABNORMAL HIGH (ref 80.0–100.0)
Monocytes Absolute: 0.7 K/uL (ref 0.1–1.0)
Monocytes Relative: 16 %
Neutro Abs: 2.2 K/uL (ref 1.7–7.7)
Neutrophils Relative %: 50 %
Platelet Count: 228 K/uL (ref 150–400)
RBC: 2.99 MIL/uL — ABNORMAL LOW (ref 4.22–5.81)
RDW: 15.1 % (ref 11.5–15.5)
WBC Count: 4.5 K/uL (ref 4.0–10.5)
nRBC: 0 % (ref 0.0–0.2)

## 2024-04-25 MED ORDER — BORTEZOMIB CHEMO SQ INJECTION 3.5 MG (2.5MG/ML)
1.3000 mg/m2 | Freq: Once | INTRAMUSCULAR | Status: AC
  Administered 2024-04-25: 3 mg via SUBCUTANEOUS
  Filled 2024-04-25: qty 1.2

## 2024-04-25 MED ORDER — PROCHLORPERAZINE MALEATE 10 MG PO TABS
10.0000 mg | ORAL_TABLET | Freq: Once | ORAL | Status: AC
  Administered 2024-04-25: 10 mg via ORAL
  Filled 2024-04-25: qty 1

## 2024-04-25 NOTE — Progress Notes (Signed)
 [facility] [phone]   [phone]  PROGRESS NOTE  Patient Care Team: [name], MD as PCP - General (Internal Medicine)  Hematological/Oncological History # IgA Lambda Multiple Myeloma 05/03/2015: last visit with Dr. Sherrod at the [facility]. Was followed for IgA lambda MGUS. 12/11/2021: labs show M protein 3.4, Kappa 19.2, Lambda 1576.4, ratio 0.01. Cr 3.47, Hgb 8.6, WBC 5.7, MCV 99, Plt 221 12/23/2021: establish care with Dr. Federico  01/16/2022: Bone marrow biopsy performed, showed a 60% cellular bone marrow predominantly comprised of plasma cells making up 70 to 80%, lambda restricted.  Myeloma FISH panel showed no evidence of abnormalities. 02/07/2022: Cycle 1 Day 1 of CyBorD chemotherapy.  03/09/2022-03/17/2022: hospitalized for fever/pneumonia.  03/24/2022: Cycle 2 Day 1 of CyBorD chemotherapy.  04/22/2022: Cycle 3 Day 1 of CyBorD chemotherapy 05/19/2022-05/26/2022: Delayed start of Cycle 4 Day 1 of CyBorD chemotherapy due to neutropenia.  06/03/2022: Cycle 4 Day 1 of CyBorD chemotherapy. 25% dose reduction of cyclophosphamide  due to cytopenias. 06/30/2022: Cycle 4 Day 22. Drop Velcade  dose to 1mg /m2 and Cyclophosphamide  200 mg/m2  07/07/2022: Cycle 5 Day 1 of CyBorD chemotherapy with dose reductions.  08/04/2022: Cycle 6 Day 1 of CyBorD chemotherapy with dose reductions.  09/09/2022: transition to maintenance dose Velcade  q 2 weeks.   Interval History:  Darren Hinton. 78 y.o. male with medical history significant for IgA lambda multiple myeloma who presents for a follow up visit. The patient's last visit was on 02/08/2024. In the interim since the last visit he has continued on maintenance Velcade .   On exam today Mr. Geffre reports he is recovering well from his hospitalization for bacteremia.  He reports that he is not currently having any fevers, chills, sweats.  He denies any nausea, vomiting, or diarrhea.  His energy and appetite are  improved.  He reports that the [place] has sent physical therapy to help him work with his legs and become stronger.  He notes that he was sick as a puppy for a few weeks but is recovering greatly.  He reports also in the interim since her last visit he did have a another COVID infection.  He notes his neuropathy is at baseline with some minor pins and needle sensation.  Overall he is willing and able to continue on Velcade  treatment at this time.  A full 10 point ROS is otherwise negative.  MEDICAL HISTORY:  Past Medical History:  Diagnosis Date   Allergy    Anxiety    Arthritis    Depression    Heart murmur    Hyperlipidemia    Hyperplastic colon polyp    Hyperproteinemia    Hypertension    Hypothyroidism    Kidney disease    Mitral valve prolapse    Multiple myeloma (HCC)    Paroxysmal atrial fibrillation (HCC)    Thyroid disease     SURGICAL HISTORY: Past Surgical History:  Procedure Laterality Date   CARDIOVERSION N/A 03/14/2022   Procedure: CARDIOVERSION;  Surgeon: Mona Vinie BROCKS, MD;  Location: [facility] ENDOSCOPY;  Service: Cardiovascular;  Laterality: N/A;   TEE WITHOUT CARDIOVERSION N/A 03/14/2022   Procedure: TRANSESOPHAGEAL ECHOCARDIOGRAM (TEE);  Surgeon: Mona Vinie BROCKS, MD;  Location: [facility] ENDOSCOPY;  Service: Cardiovascular;  Laterality: N/A;    SOCIAL HISTORY: Social History   Socioeconomic History   Marital status: Married    Spouse name: [name]   Number of children: 2   Years of education: Not on file   Highest education level:  Not on file  Occupational History   Occupation: retired  Tobacco Use   Smoking status: Former    Current packs/day: 0.00    Types: Cigarettes    Start date: 10/01/1958    Quit date: 10/01/1978    Years since quitting: 45.5   Smokeless tobacco: Never   Tobacco comments:    Former smoker 04/03/22  Vaping Use   Vaping status: Never Used  Substance and Sexual Activity   Alcohol use: Not Currently    Alcohol/week: 2.0 standard drinks of  alcohol    Types: 2 Glasses of wine per week    Comment: None in many years   Drug use: No   Sexual activity: Not on file  Other Topics Concern   Not on file  Social History Narrative   Not on file   Social Drivers of Health   Financial Resource Strain: Not on file  Food Insecurity: No Food Insecurity (04/08/2024)   Hunger Vital Sign    Worried About Running Out of Food in the Last Year: Never true    Ran Out of Food in the Last Year: Never true  Transportation Needs: No Transportation Needs (04/08/2024)   PRAPARE - Administrator, Civil Service (Medical): No    Lack of Transportation (Non-Medical): No  Physical Activity: Not on file  Stress: Not on file  Social Connections: Socially Integrated (04/08/2024)   Social Connection and Isolation Panel    Frequency of Communication with Friends and Family: More than three times a week    Frequency of Social Gatherings with Friends and Family: Twice a week    Attends Religious Services: More than 4 times per year    Active Member of [employer] or Organizations: Yes    Attends Banker Meetings: 1 to 4 times per year    Marital Status: Married  Catering manager Violence: Not At Risk (04/08/2024)   Humiliation, Afraid, Rape, and Kick questionnaire    Fear of Current or Ex-Partner: No    Emotionally Abused: No    Physically Abused: No    Sexually Abused: No    FAMILY HISTORY: Family History  Problem Relation Age of Onset   Kidney disease Mother    Prostate cancer Father    Kidney disease Brother    Colon cancer Neg Hx    Esophageal cancer Neg Hx    Liver cancer Neg Hx    Pancreatic cancer Neg Hx    Rectal cancer Neg Hx    Stomach cancer Neg Hx     ALLERGIES:  is allergic to zithromax  [azithromycin ].  MEDICATIONS:  Current Outpatient Medications  Medication Sig Dispense Refill   acyclovir  (ZOVIRAX ) 400 MG tablet TAKE 1 TABLET BY MOUTH TWICE A DAY 180 tablet 1   allopurinol  (ZYLOPRIM ) 300 MG tablet TAKE 1  TABLET BY MOUTH EVERY DAY 90 tablet 1   ALPRAZolam  (XANAX ) 0.25 MG tablet Take 0.25 mg by mouth 2 (two) times daily as needed for anxiety.     amiodarone  (PACERONE ) 200 MG tablet TAKE 1 TABLET BY MOUTH EVERY DAY 90 tablet 3   apixaban  (ELIQUIS ) 5 MG TABS tablet Take 1 tablet (5 mg total) by mouth 2 (two) times daily. 180 tablet 1   benzonatate  (TESSALON ) 100 MG capsule Take 1 capsule (100 mg total) by mouth every 8 (eight) hours. 21 capsule 0   dexamethasone  (DECADRON ) 4 MG tablet TAKE 10 TABLETS (40 MG TOTAL) BY MOUTH ONCE A WEEK IN THE MORNING ON CHEMOTHERAPY DAYS (  Patient taking differently: Take 40 mg by mouth every 14 (fourteen) days. Every other Monday) 120 tablet 1   ferrous gluconate  (FERGON) 324 MG tablet Take 1 tablet (324 mg total) by mouth 3 (three) times daily with meals. 90 tablet 0   fluticasone (FLONASE) 50 MCG/ACT nasal spray Place 2 sprays into the nose daily as needed for allergies.     gabapentin  (NEURONTIN ) 300 MG capsule TAKE 1 CAPSULE BY MOUTH TWICE A DAY 60 capsule 1   guaiFENesin  (MUCINEX ) 600 MG 12 hr tablet Take 600 mg by mouth 2 (two) times daily as needed for cough or to loosen phlegm.     levothyroxine  (SYNTHROID ) 125 MCG tablet Take 125 mcg by mouth daily before breakfast. Every day except on Mondays     loratadine (CLARITIN) 10 MG tablet Take 10 mg by mouth daily as needed for allergies.     meclizine  (ANTIVERT ) 25 MG tablet Take 1 tablet (25 mg total) by mouth 3 (three) times daily as needed for dizziness. 30 tablet 0   metoprolol  succinate (TOPROL -XL) 25 MG 24 hr tablet Take 0.5 tablets (12.5 mg total) by mouth daily. Take with or immediately following a meal.     Olmesartan-amLODIPine -HCTZ 20-5-12.5 MG TABS Take 1 tablet by mouth daily.     rosuvastatin (CRESTOR) 20 MG tablet Take 20 mg by mouth daily.     senna-docusate (SENOKOT-S) 8.6-50 MG tablet Take 1 tablet by mouth at bedtime as needed for mild constipation.     traMADol  (ULTRAM ) 50 MG tablet Take 1 tablet  (50 mg total) by mouth every 12 (twelve) hours as needed.     vitamin B-12 (CYANOCOBALAMIN ) 500 MCG tablet Take 500 mcg by mouth in the morning.     No current facility-administered medications for this visit.   Facility-Administered Medications Ordered in Other Visits  Medication Dose Route Frequency Provider Last Rate Last Admin   bortezomib  SQ (VELCADE ) chemo injection (2.5mg /mL concentration) 3 mg  1.3 mg/m2 (Treatment Plan Recorded) Subcutaneous Once Federico Norleen ONEIDA MADISON, MD        REVIEW OF SYSTEMS:   Constitutional: ( - ) fevers, ( - )  chills , ( - ) night sweats Eyes: ( - ) blurriness of vision, ( - ) double vision, ( - ) watery eyes Ears, nose, mouth, throat, and face: ( - ) mucositis, ( - ) sore throat Respiratory: ( - ) cough, ( - ) dyspnea, ( - ) wheezes Cardiovascular: ( - ) palpitation, ( - ) chest discomfort, (+ ) lower extremity swelling Gastrointestinal:  ( - ) nausea, ( - ) heartburn, ( - ) change in bowel habits Skin: ( - ) abnormal skin rashes Lymphatics: ( - ) new lymphadenopathy, ( - ) easy bruising Neurological: ( + ) numbness, ( - ) tingling, ( - ) new weaknesses Behavioral/Psych: ( - ) mood change, ( - ) new changes  All other systems were reviewed with the patient and are negative.  PHYSICAL EXAMINATION: ECOG PERFORMANCE STATUS: 1 - Symptomatic but completely ambulatory  Vitals:   04/25/24 1025  BP: 122/64  Pulse: (!) 53  Resp: 18  Temp: 98.3 F (36.8 C)  SpO2: 100%       Filed Weights   04/25/24 1025  Weight: 242 lb 1.6 oz (109.8 kg)      GENERAL: Chronically ill-appearing elderly African-American male, alert, no distress and comfortable SKIN: skin color, texture, turgor are normal, no rashes or significant lesions EYES: conjunctiva are pink and non-injected, sclera  clear LUNGS: clear to auscultation and percussion with normal breathing effort HEART: regular rate & rhythm and no murmurs and + 2 bilateral ankle edema, wearing compression  stockings.  Musculoskeletal: no cyanosis of digits and no clubbing  PSYCH: alert & oriented x 3, fluent speech NEURO: no focal motor/sensory deficits  LABORATORY DATA:  I have reviewed the data as listed    Latest Ref Rng & Units 04/25/2024    9:35 AM 04/09/2024    6:23 AM 04/08/2024    9:58 AM  CBC  WBC 4.0 - 10.5 K/uL 4.5  4.7  5.8   Hemoglobin 13.0 - 17.0 g/dL 9.9  9.1  9.7   Hematocrit 39.0 - 52.0 % 30.7  30.4  32.5   Platelets 150 - 400 K/uL 228  145  137        Latest Ref Rng & Units 04/25/2024    9:35 AM 04/09/2024    6:23 AM 04/08/2024    9:58 AM  CMP  Glucose 70 - 99 mg/dL 93  84  93   BUN 8 - 23 mg/dL 26  28  28    Creatinine 0.61 - 1.24 mg/dL 7.14  6.87  6.67   Sodium 135 - 145 mmol/L 141  136  137   Potassium 3.5 - 5.1 mmol/L 4.1  4.1  4.3   Chloride 98 - 111 mmol/L 108  109  109   CO2 22 - 32 mmol/L 26  19  23    Calcium 8.9 - 10.3 mg/dL 9.3  8.3  8.7   Total Protein 6.5 - 8.1 g/dL 6.7     Total Bilirubin 0.0 - 1.2 mg/dL 0.4     Alkaline Phos 38 - 126 U/L 57     AST 15 - 41 U/L 29     ALT 0 - 44 U/L 23       Lab Results  Component Value Date   MPROTEIN Not Observed 03/28/2024   MPROTEIN Not Observed 02/08/2024   MPROTEIN Not Observed 01/12/2024   Lab Results  Component Value Date   KPAFRELGTCHN 20.6 (H) 03/28/2024   KPAFRELGTCHN 14.4 02/08/2024   KPAFRELGTCHN 14.4 01/12/2024   LAMBDASER 22.3 03/28/2024   LAMBDASER 14.5 02/08/2024   LAMBDASER 13.9 01/12/2024   KAPLAMBRATIO 0.92 03/28/2024   KAPLAMBRATIO 0.99 02/08/2024   KAPLAMBRATIO 1.04 01/12/2024    RADIOGRAPHIC STUDIES: DG Chest 2 View Result Date: 04/06/2024 CLINICAL DATA:  Shortness of breath, COVID positive EXAM: CHEST - 2 VIEW COMPARISON:  03/12/2022 FINDINGS: Mild hyperinflation. Heart and mediastinal contours are within normal limits. No focal opacities or effusions. No acute bony abnormality. IMPRESSION: No active cardiopulmonary disease. Electronically Signed   By: Franky Crease M.D.   On:  04/06/2024 18:15    ASSESSMENT & PLAN Darren Hinton. Is a 78 y.o. male with medical history significant for IgA lambda multiple myeloma who presents for a follow up visit. .    # IgA lambda Multiple Myeloma -- Bone marrow biopsy performed on 01/16/2022 showed increased plasma cells consistent with a plasma cell neoplasm.  Normal FISH panel. --Patient meets diagnostic criteria based on kidney dysfunction and anemia. --Cycle 1 Day 1 of CyBorD chemotherapy started on 02/07/2022 --Velcade  maintenance therapy started on 09/09/2022.  Plan: --Most recent SPEP from 12/14/2023 showed M protein not detected and normal serum free light chains --Labs today show white blood cell 4.5, hemoglobin 9.9, MCV 102.7, platelets 228.  Creatinine overall stable at 2.85. Calcium level is normal. LFTs normal. Myeloma panel  pending.  --continue on maintenance Velcade  every 2 weeks with monthly clinic visits.  # Leg Pain -- Likely a component of neuropathy from his chemotherapy with pre-existing sciatica pain. -- continue gabapentin  to 300 mg twice daily -- continue tramadol  50 mg every 6 hours as needed -- patient connected with Dr. Buckley  #Supportive Care -- chemotherapy education complete -- port placement not required.  -- zofran  8mg  q8H PRN and compazine  10mg  PO q6H for nausea -- acyclovir  400mg  PO BID for VCZ prophylaxis --zometa to start after dental clearance.   No orders of the defined types were placed in this encounter.  All questions were answered. The patient knows to call the clinic with any problems, questions or concerns.  I have spent a total of 30 minutes minutes of face-to-face and non-face-to-face time, preparing to see the patient, performing a medically appropriate examination, counseling and educating the patient, documenting clinical information in the electronic health record,  and care coordination.   Norleen IVAR Kidney, MD Department of Hematology/Oncology [facility]  at [facility] Phone: [phone] Pager: [phone] Email: [email or web address]   04/25/2024 11:39 AM

## 2024-04-25 NOTE — Progress Notes (Signed)
 Ok to proceed with C42D1 today per Dr. Federico.  Armiyah Capron, PharmD, MBA

## 2024-04-25 NOTE — Patient Instructions (Signed)
 [facility] - A DEPT OF [facility]  Discharge Instructions: Thank you for choosing [facility] to provide your oncology and hematology care.   If you have a lab appointment with the Cancer Center, please go directly to the Cancer Center and check in at the registration area.   Wear comfortable clothing and clothing appropriate for easy access to any Portacath or PICC line.   We strive to give you quality time with your provider. You may need to reschedule your appointment if you arrive late (15 or more minutes).  Arriving late affects you and other patients whose appointments are after yours.  Also, if you miss three or more appointments without notifying the office, you may be dismissed from the clinic at the provider's discretion.      For prescription refill requests, have your pharmacy contact our office and allow 72 hours for refills to be completed.    Today you received the following chemotherapy and/or immunotherapy agents: bortezomib       To help prevent nausea and vomiting after your treatment, we encourage you to take your nausea medication as directed.  BELOW ARE SYMPTOMS THAT SHOULD BE REPORTED IMMEDIATELY: *FEVER GREATER THAN 100.4 F (38 C) OR HIGHER *CHILLS OR SWEATING *NAUSEA AND VOMITING THAT IS NOT CONTROLLED WITH YOUR NAUSEA MEDICATION *UNUSUAL SHORTNESS OF BREATH *UNUSUAL BRUISING OR BLEEDING *URINARY PROBLEMS (pain or burning when urinating, or frequent urination) *BOWEL PROBLEMS (unusual diarrhea, constipation, pain near the anus) TENDERNESS IN MOUTH AND THROAT WITH OR WITHOUT PRESENCE OF ULCERS (sore throat, sores in mouth, or a toothache) UNUSUAL RASH, SWELLING OR PAIN  UNUSUAL VAGINAL DISCHARGE OR ITCHING   Items with * indicate a potential emergency and should be followed up as soon as possible or go to the Emergency Department if any problems should occur.  Please show the CHEMOTHERAPY ALERT CARD or IMMUNOTHERAPY  ALERT CARD at check-in to the Emergency Department and triage nurse.  Should you have questions after your visit or need to cancel or reschedule your appointment, please contact [facility] - A DEPT OF [facility]  Dept: [phone]  and follow the prompts.  Office hours are 8:00 a.m. to 4:30 p.m. Monday - Friday. Please note that voicemails left after 4:00 p.m. may not be returned until the following business day.  We are closed weekends and major holidays. You have access to a nurse at all times for urgent questions. Please call the main number to the clinic Dept: [phone] and follow the prompts.   For any non-urgent questions, you may also contact your provider using MyChart. We now offer e-Visits for anyone 75 and older to request care online for non-urgent symptoms. For details visit [email or web address].   Also download the MyChart app! Go to the app store, search MyChart, open the app, select [place], and log in with your MyChart username and password.

## 2024-04-26 LAB — KAPPA/LAMBDA LIGHT CHAINS
Kappa free light chain: 24.2 mg/L — ABNORMAL HIGH (ref 3.3–19.4)
Kappa, lambda light chain ratio: 0.86 (ref 0.26–1.65)
Lambda free light chains: 28.1 mg/L — ABNORMAL HIGH (ref 5.7–26.3)

## 2024-04-27 ENCOUNTER — Other Ambulatory Visit: Payer: Self-pay

## 2024-04-29 LAB — MULTIPLE MYELOMA PANEL, SERUM
Albumin SerPl Elph-Mcnc: 4.1 g/dL (ref 2.9–4.4)
Albumin/Glob SerPl: 1.9 — ABNORMAL HIGH (ref 0.7–1.7)
Alpha 1: 0.2 g/dL (ref 0.0–0.4)
Alpha2 Glob SerPl Elph-Mcnc: 0.7 g/dL (ref 0.4–1.0)
B-Globulin SerPl Elph-Mcnc: 0.8 g/dL (ref 0.7–1.3)
Gamma Glob SerPl Elph-Mcnc: 0.5 g/dL (ref 0.4–1.8)
Globulin, Total: 2.2 g/dL (ref 2.2–3.9)
IgA: 96 mg/dL (ref 61–437)
IgG (Immunoglobin G), Serum: 599 mg/dL — ABNORMAL LOW (ref 603–1613)
IgM (Immunoglobulin M), Srm: 67 mg/dL (ref 15–143)
Total Protein ELP: 6.3 g/dL (ref 6.0–8.5)

## 2024-05-04 ENCOUNTER — Other Ambulatory Visit: Payer: Self-pay | Admitting: Hematology and Oncology

## 2024-05-04 ENCOUNTER — Other Ambulatory Visit: Payer: Self-pay

## 2024-05-05 ENCOUNTER — Encounter: Payer: Self-pay | Admitting: Hematology and Oncology

## 2024-05-10 ENCOUNTER — Inpatient Hospital Stay: Attending: Hematology and Oncology | Admitting: Physician Assistant

## 2024-05-10 ENCOUNTER — Inpatient Hospital Stay: Attending: Hematology and Oncology

## 2024-05-10 ENCOUNTER — Inpatient Hospital Stay

## 2024-05-10 VITALS — BP 124/70 | HR 95 | Temp 97.9°F | Resp 18 | Wt 242.0 lb

## 2024-05-10 DIAGNOSIS — D649 Anemia, unspecified: Secondary | ICD-10-CM | POA: Insufficient documentation

## 2024-05-10 DIAGNOSIS — Z8042 Family history of malignant neoplasm of prostate: Secondary | ICD-10-CM | POA: Diagnosis not present

## 2024-05-10 DIAGNOSIS — C9000 Multiple myeloma not having achieved remission: Secondary | ICD-10-CM

## 2024-05-10 DIAGNOSIS — G629 Polyneuropathy, unspecified: Secondary | ICD-10-CM | POA: Insufficient documentation

## 2024-05-10 DIAGNOSIS — Z860102 Personal history of hyperplastic colon polyps: Secondary | ICD-10-CM | POA: Diagnosis not present

## 2024-05-10 DIAGNOSIS — I480 Paroxysmal atrial fibrillation: Secondary | ICD-10-CM | POA: Diagnosis not present

## 2024-05-10 DIAGNOSIS — Z87891 Personal history of nicotine dependence: Secondary | ICD-10-CM | POA: Diagnosis not present

## 2024-05-10 DIAGNOSIS — Z7901 Long term (current) use of anticoagulants: Secondary | ICD-10-CM | POA: Diagnosis not present

## 2024-05-10 DIAGNOSIS — Z841 Family history of disorders of kidney and ureter: Secondary | ICD-10-CM | POA: Diagnosis not present

## 2024-05-10 DIAGNOSIS — Z5112 Encounter for antineoplastic immunotherapy: Secondary | ICD-10-CM | POA: Insufficient documentation

## 2024-05-10 DIAGNOSIS — Z79890 Hormone replacement therapy (postmenopausal): Secondary | ICD-10-CM | POA: Insufficient documentation

## 2024-05-10 DIAGNOSIS — M5441 Lumbago with sciatica, right side: Secondary | ICD-10-CM | POA: Insufficient documentation

## 2024-05-10 DIAGNOSIS — Z881 Allergy status to other antibiotic agents status: Secondary | ICD-10-CM | POA: Insufficient documentation

## 2024-05-10 DIAGNOSIS — Z7969 Long term (current) use of other immunomodulators and immunosuppressants: Secondary | ICD-10-CM | POA: Insufficient documentation

## 2024-05-10 DIAGNOSIS — Z79899 Other long term (current) drug therapy: Secondary | ICD-10-CM | POA: Diagnosis not present

## 2024-05-10 LAB — CBC WITH DIFFERENTIAL (CANCER CENTER ONLY)
Abs Immature Granulocytes: 0.01 K/uL (ref 0.00–0.07)
Basophils Absolute: 0 K/uL (ref 0.0–0.1)
Basophils Relative: 1 %
Eosinophils Absolute: 0.2 K/uL (ref 0.0–0.5)
Eosinophils Relative: 4 %
HCT: 32.5 % — ABNORMAL LOW (ref 39.0–52.0)
Hemoglobin: 10.5 g/dL — ABNORMAL LOW (ref 13.0–17.0)
Immature Granulocytes: 0 %
Lymphocytes Relative: 24 %
Lymphs Abs: 1.2 K/uL (ref 0.7–4.0)
MCH: 32.7 pg (ref 26.0–34.0)
MCHC: 32.3 g/dL (ref 30.0–36.0)
MCV: 101.2 fL — ABNORMAL HIGH (ref 80.0–100.0)
Monocytes Absolute: 0.7 K/uL (ref 0.1–1.0)
Monocytes Relative: 13 %
Neutro Abs: 2.9 K/uL (ref 1.7–7.7)
Neutrophils Relative %: 58 %
Platelet Count: 182 K/uL (ref 150–400)
RBC: 3.21 MIL/uL — ABNORMAL LOW (ref 4.22–5.81)
RDW: 15 % (ref 11.5–15.5)
WBC Count: 4.9 K/uL (ref 4.0–10.5)
nRBC: 0 % (ref 0.0–0.2)

## 2024-05-10 LAB — CMP (CANCER CENTER ONLY)
ALT: 24 U/L (ref 0–44)
AST: 26 U/L (ref 15–41)
Albumin: 4.1 g/dL (ref 3.5–5.0)
Alkaline Phosphatase: 59 U/L (ref 38–126)
Anion gap: 7 (ref 5–15)
BUN: 20 mg/dL (ref 8–23)
CO2: 26 mmol/L (ref 22–32)
Calcium: 9 mg/dL (ref 8.9–10.3)
Chloride: 108 mmol/L (ref 98–111)
Creatinine: 2.75 mg/dL — ABNORMAL HIGH (ref 0.61–1.24)
GFR, Estimated: 23 mL/min — ABNORMAL LOW (ref >60)
Glucose, Bld: 89 mg/dL (ref 70–99)
Potassium: 4.1 mmol/L (ref 3.5–5.1)
Sodium: 141 mmol/L (ref 135–145)
Total Bilirubin: 0.4 mg/dL (ref 0.0–1.2)
Total Protein: 6.6 g/dL (ref 6.5–8.1)

## 2024-05-10 MED ORDER — PROCHLORPERAZINE MALEATE 10 MG PO TABS
10.0000 mg | ORAL_TABLET | Freq: Once | ORAL | Status: AC
  Administered 2024-05-10: 10 mg via ORAL
  Filled 2024-05-10: qty 1

## 2024-05-10 MED ORDER — BORTEZOMIB CHEMO SQ INJECTION 3.5 MG (2.5MG/ML)
1.3000 mg/m2 | Freq: Once | INTRAMUSCULAR | Status: AC
  Administered 2024-05-10: 3 mg via SUBCUTANEOUS
  Filled 2024-05-10: qty 1.2

## 2024-05-10 NOTE — Progress Notes (Signed)
 [facility] [phone]   [phone]  PROGRESS NOTE  Patient Care Team: [name], MD as PCP - General (Internal Medicine)  Hematological/Oncological History # IgA Lambda Multiple Myeloma 05/03/2015: last visit with Dr. Sherrod at the [facility]. Was followed for IgA lambda MGUS. 12/11/2021: labs show M protein 3.4, Kappa 19.2, Lambda 1576.4, ratio 0.01. Cr 3.47, Hgb 8.6, WBC 5.7, MCV 99, Plt 221 12/23/2021: establish care with Dr. Federico  01/16/2022: Bone marrow biopsy performed, showed a 60% cellular bone marrow predominantly comprised of plasma cells making up 70 to 80%, lambda restricted.  Myeloma FISH panel showed no evidence of abnormalities. 02/07/2022: Cycle 1 Day 1 of CyBorD chemotherapy.  03/09/2022-03/17/2022: hospitalized for fever/pneumonia.  03/24/2022: Cycle 2 Day 1 of CyBorD chemotherapy.  04/22/2022: Cycle 3 Day 1 of CyBorD chemotherapy 05/19/2022-05/26/2022: Delayed start of Cycle 4 Day 1 of CyBorD chemotherapy due to neutropenia.  06/03/2022: Cycle 4 Day 1 of CyBorD chemotherapy. 25% dose reduction of cyclophosphamide  due to cytopenias. 06/30/2022: Cycle 4 Day 22. Drop Velcade  dose to 1mg /m2 and Cyclophosphamide  200 mg/m2  07/07/2022: Cycle 5 Day 1 of CyBorD chemotherapy with dose reductions.  08/04/2022: Cycle 6 Day 1 of CyBorD chemotherapy with dose reductions.  09/09/2022: transition to maintenance dose Velcade  q 2 weeks.   Interval History:  Darren Hinton. 78 y.o. male with medical history significant for IgA lambda multiple myeloma who presents for a follow up visit. The patient's last visit was on 04/25/2024. In the interim since the last visit he has continued on maintenance Velcade .   On exam today Darren Hinton reports he is recovering well after his hospitalization last month.  He is about to start home physical therapy to help improve his strength.  He is currently using a rollator to help with ambulation.  He adds that his  neuropathy is mainly involving his feet.  He does have chronic right-sided sciatica and low back pain which impacts his ability to stand long-term.  He denies nausea, vomiting or bowel habit changes.  Denies easy bruising or signs of active bleeding.  He denies any new bone pain or back pain.  He denies fevers, chills, night sweats, shortness of breath, chest pain or cough. Overall he is willing and able to continue on Velcade  treatment at this time.  A full 10 point ROS is otherwise negative.  MEDICAL HISTORY:  Past Medical History:  Diagnosis Date   Allergy    Anxiety    Arthritis    Depression    Heart murmur    Hyperlipidemia    Hyperplastic colon polyp    Hyperproteinemia    Hypertension    Hypothyroidism    Kidney disease    Mitral valve prolapse    Multiple myeloma (HCC)    Paroxysmal atrial fibrillation (HCC)    Thyroid disease     SURGICAL HISTORY: Past Surgical History:  Procedure Laterality Date   CARDIOVERSION N/A 03/14/2022   Procedure: CARDIOVERSION;  Surgeon: Mona Vinie BROCKS, MD;  Location: [facility] ENDOSCOPY;  Service: Cardiovascular;  Laterality: N/A;   TEE WITHOUT CARDIOVERSION N/A 03/14/2022   Procedure: TRANSESOPHAGEAL ECHOCARDIOGRAM (TEE);  Surgeon: Mona Vinie BROCKS, MD;  Location: [facility] ENDOSCOPY;  Service: Cardiovascular;  Laterality: N/A;    SOCIAL HISTORY: Social History   Socioeconomic History   Marital status: Married    Spouse name: [name]   Number of children: 2   Years of education: Not on file   Highest education level: Not on file  Occupational History   Occupation: retired  Tobacco Use   Smoking status: Former    Current packs/day: 0.00    Types: Cigarettes    Start date: 10/01/1958    Quit date: 10/01/1978    Years since quitting: 45.6   Smokeless tobacco: Never   Tobacco comments:    Former smoker 04/03/22  Vaping Use   Vaping status: Never Used  Substance and Sexual Activity   Alcohol use: Not Currently    Alcohol/week: 2.0 standard drinks  of alcohol    Types: 2 Glasses of wine per week    Comment: None in many years   Drug use: No   Sexual activity: Not on file  Other Topics Concern   Not on file  Social History Narrative   Not on file   Social Drivers of Health   Financial Resource Strain: Not on file  Food Insecurity: No Food Insecurity (04/08/2024)   Hunger Vital Sign    Worried About Running Out of Food in the Last Year: Never true    Ran Out of Food in the Last Year: Never true  Transportation Needs: No Transportation Needs (04/08/2024)   PRAPARE - Administrator, Civil Service (Medical): No    Lack of Transportation (Non-Medical): No  Physical Activity: Not on file  Stress: Not on file  Social Connections: Socially Integrated (04/08/2024)   Social Connection and Isolation Panel    Frequency of Communication with Friends and Family: More than three times a week    Frequency of Social Gatherings with Friends and Family: Twice a week    Attends Religious Services: More than 4 times per year    Active Member of [employer] or Organizations: Yes    Attends Banker Meetings: 1 to 4 times per year    Marital Status: Married  Catering manager Violence: Not At Risk (04/08/2024)   Humiliation, Afraid, Rape, and Kick questionnaire    Fear of Current or Ex-Partner: No    Emotionally Abused: No    Physically Abused: No    Sexually Abused: No    FAMILY HISTORY: Family History  Problem Relation Age of Onset   Kidney disease Mother    Prostate cancer Father    Kidney disease Brother    Colon cancer Neg Hx    Esophageal cancer Neg Hx    Liver cancer Neg Hx    Pancreatic cancer Neg Hx    Rectal cancer Neg Hx    Stomach cancer Neg Hx     ALLERGIES:  is allergic to zithromax  [azithromycin ].  MEDICATIONS:  Current Outpatient Medications  Medication Sig Dispense Refill   acyclovir  (ZOVIRAX ) 400 MG tablet TAKE 1 TABLET BY MOUTH TWICE A DAY 180 tablet 1   allopurinol  (ZYLOPRIM ) 300 MG tablet TAKE 1  TABLET BY MOUTH EVERY DAY 90 tablet 1   ALPRAZolam  (XANAX ) 0.25 MG tablet Take 0.25 mg by mouth 2 (two) times daily as needed for anxiety.     amiodarone  (PACERONE ) 200 MG tablet TAKE 1 TABLET BY MOUTH EVERY DAY 90 tablet 3   apixaban  (ELIQUIS ) 5 MG TABS tablet Take 1 tablet (5 mg total) by mouth 2 (two) times daily. 180 tablet 1   benzonatate  (TESSALON ) 100 MG capsule Take 1 capsule (100 mg total) by mouth every 8 (eight) hours. 21 capsule 0   dexamethasone  (DECADRON ) 4 MG tablet TAKE 10 TABLETS (40 MG TOTAL) BY MOUTH ONCE A WEEK IN THE MORNING ON CHEMOTHERAPY DAYS (Patient taking differently: Take  40 mg by mouth every 14 (fourteen) days. Every other Monday) 120 tablet 1   ferrous gluconate  (FERGON) 324 MG tablet Take 1 tablet (324 mg total) by mouth 3 (three) times daily with meals. 90 tablet 0   fluticasone (FLONASE) 50 MCG/ACT nasal spray Place 2 sprays into the nose daily as needed for allergies.     gabapentin  (NEURONTIN ) 300 MG capsule TAKE 1 CAPSULE BY MOUTH TWICE A DAY 60 capsule 1   guaiFENesin  (MUCINEX ) 600 MG 12 hr tablet Take 600 mg by mouth 2 (two) times daily as needed for cough or to loosen phlegm.     levothyroxine  (SYNTHROID ) 125 MCG tablet Take 125 mcg by mouth daily before breakfast. Every day except on Mondays     loratadine (CLARITIN) 10 MG tablet Take 10 mg by mouth daily as needed for allergies.     meclizine  (ANTIVERT ) 25 MG tablet Take 1 tablet (25 mg total) by mouth 3 (three) times daily as needed for dizziness. 30 tablet 0   metoprolol  succinate (TOPROL -XL) 25 MG 24 hr tablet Take 0.5 tablets (12.5 mg total) by mouth daily. Take with or immediately following a meal.     Olmesartan-amLODIPine -HCTZ 20-5-12.5 MG TABS Take 1 tablet by mouth daily.     rosuvastatin (CRESTOR) 20 MG tablet Take 20 mg by mouth daily.     senna-docusate (SENOKOT-S) 8.6-50 MG tablet Take 1 tablet by mouth at bedtime as needed for mild constipation.     traMADol  (ULTRAM ) 50 MG tablet Take 1 tablet  (50 mg total) by mouth every 12 (twelve) hours as needed.     vitamin B-12 (CYANOCOBALAMIN ) 500 MCG tablet Take 500 mcg by mouth in the morning.     No current facility-administered medications for this visit.    REVIEW OF SYSTEMS:   Constitutional: ( - ) fevers, ( - )  chills , ( - ) night sweats Eyes: ( - ) blurriness of vision, ( - ) double vision, ( - ) watery eyes Ears, nose, mouth, throat, and face: ( - ) mucositis, ( - ) sore throat Respiratory: ( - ) cough, ( - ) dyspnea, ( - ) wheezes Cardiovascular: ( - ) palpitation, ( - ) chest discomfort, (+ ) lower extremity swelling Gastrointestinal:  ( - ) nausea, ( - ) heartburn, ( - ) change in bowel habits Skin: ( - ) abnormal skin rashes Lymphatics: ( - ) new lymphadenopathy, ( - ) easy bruising Neurological: ( + ) numbness, ( - ) tingling, ( - ) new weaknesses Behavioral/Psych: ( - ) mood change, ( - ) new changes  All other systems were reviewed with the patient and are negative.  PHYSICAL EXAMINATION: ECOG PERFORMANCE STATUS: 1 - Symptomatic but completely ambulatory  Vitals:   05/10/24 0854  BP: 124/70  Pulse: 95  Resp: 18  Temp: 97.9 F (36.6 C)  SpO2: 97%       Filed Weights   05/10/24 0854  Weight: 242 lb (109.8 kg)      GENERAL: Chronically ill-appearing elderly African-American male, alert, no distress and comfortable SKIN: skin color, texture, turgor are normal, no rashes or significant lesions EYES: conjunctiva are pink and non-injected, sclera clear LUNGS: clear to auscultation and percussion with normal breathing effort HEART: regular rate & rhythm and no murmurs and +1 bilateral ankle edema, wearing compression stockings.  Musculoskeletal: no cyanosis of digits and no clubbing  PSYCH: alert & oriented x 3, fluent speech NEURO: no focal motor/sensory deficits  LABORATORY DATA:  I have reviewed the data as listed    Latest Ref Rng & Units 05/10/2024    8:33 AM 04/25/2024    9:35 AM 04/09/2024    6:23  AM  CBC  WBC 4.0 - 10.5 K/uL 4.9  4.5  4.7   Hemoglobin 13.0 - 17.0 g/dL 89.4  9.9  9.1   Hematocrit 39.0 - 52.0 % 32.5  30.7  30.4   Platelets 150 - 400 K/uL 182  228  145        Latest Ref Rng & Units 04/25/2024    9:35 AM 04/09/2024    6:23 AM 04/08/2024    9:58 AM  CMP  Glucose 70 - 99 mg/dL 93  84  93   BUN 8 - 23 mg/dL 26  28  28    Creatinine 0.61 - 1.24 mg/dL 7.14  6.87  6.67   Sodium 135 - 145 mmol/L 141  136  137   Potassium 3.5 - 5.1 mmol/L 4.1  4.1  4.3   Chloride 98 - 111 mmol/L 108  109  109   CO2 22 - 32 mmol/L 26  19  23    Calcium 8.9 - 10.3 mg/dL 9.3  8.3  8.7   Total Protein 6.5 - 8.1 g/dL 6.7     Total Bilirubin 0.0 - 1.2 mg/dL 0.4     Alkaline Phos 38 - 126 U/L 57     AST 15 - 41 U/L 29     ALT 0 - 44 U/L 23       Lab Results  Component Value Date   MPROTEIN Not Observed 04/25/2024   MPROTEIN Not Observed 03/28/2024   MPROTEIN Not Observed 02/08/2024   Lab Results  Component Value Date   KPAFRELGTCHN 24.2 (H) 04/25/2024   KPAFRELGTCHN 20.6 (H) 03/28/2024   KPAFRELGTCHN 14.4 02/08/2024   LAMBDASER 28.1 (H) 04/25/2024   LAMBDASER 22.3 03/28/2024   LAMBDASER 14.5 02/08/2024   KAPLAMBRATIO 0.86 04/25/2024   KAPLAMBRATIO 0.92 03/28/2024   KAPLAMBRATIO 0.99 02/08/2024    RADIOGRAPHIC STUDIES: No results found.   ASSESSMENT & PLAN Darren Hinton. Is a 78 y.o. male with medical history significant for IgA lambda multiple myeloma who presents for a follow up visit. .    # IgA lambda Multiple Myeloma -- Bone marrow biopsy performed on 01/16/2022 showed increased plasma cells consistent with a plasma cell neoplasm.  Normal FISH panel. --Patient meets diagnostic criteria based on kidney dysfunction and anemia. --Cycle 1 Day 1 of CyBorD chemotherapy started on 02/07/2022 --Velcade  maintenance therapy started on 09/09/2022.  Plan: --Due for Cycle 43, Day 1 of maintenance Velcade  --Labs today show white blood cell 4.9, hemoglobin 10.5, MCV 101.2, platelets  182.  Creatinine overall stable at 2.75. Calcium level is normal. LFTs normal. Myeloma panel pending.  --Most recent SPEP from 04/25/2024 showed M protein not detected, kappa light chain 24.2, lambda light chain 28.1, ratio normal. Reviewed with patient and plan to monitor with repeat labs today.  --continue on maintenance Velcade  every 2 weeks with monthly clinic visits.  # Leg Pain -- Likely a component of neuropathy from his chemotherapy with pre-existing sciatica and radiculopathy pain. -- continue gabapentin  to 300 mg twice daily -- continue tramadol  50 mg every 6 hours as needed -- patient connected with Dr. Buckley  #Supportive Care -- chemotherapy education complete -- port placement not required.  -- zofran  8mg  q8H PRN and compazine  10mg  PO q6H for nausea -- acyclovir  400mg  PO BID for VCZ  prophylaxis --zometa to start after dental clearance.   No orders of the defined types were placed in this encounter.  All questions were answered. The patient knows to call the clinic with any problems, questions or concerns.  I have spent a total of 30 minutes minutes of face-to-face and non-face-to-face time, preparing to see the patient, performing a medically appropriate examination, counseling and educating the patient, documenting clinical information in the electronic health record,  and care coordination.   Johnston Police PA-C Dept of Hematology and Oncology [facility] Phone: [phone]    05/10/2024 9:00 AM

## 2024-05-10 NOTE — Patient Instructions (Signed)
 [facility] - A DEPT OF [facility] [place]  Discharge Instructions: Thank you for choosing [facility] to provide your oncology and hematology care.   If you have a lab appointment with the Cancer Center, please go directly to the Cancer Center and check in at the registration area.   Wear comfortable clothing and clothing appropriate for easy access to any Portacath or PICC line.   We strive to give you quality time with your provider. You may need to reschedule your appointment if you arrive late (15 or more minutes).  Arriving late affects you and other patients whose appointments are after yours.  Also, if you miss three or more appointments without notifying the office, you may be dismissed from the clinic at the provider's discretion.      For prescription refill requests, have your pharmacy contact our office and allow 72 hours for refills to be completed.    Today you received the following chemotherapy and/or immunotherapy agents: Velcade     To help prevent nausea and vomiting after your treatment, we encourage you to take your nausea medication as directed.  BELOW ARE SYMPTOMS THAT SHOULD BE REPORTED IMMEDIATELY: *FEVER GREATER THAN 100.4 F (38 C) OR HIGHER *CHILLS OR SWEATING *NAUSEA AND VOMITING THAT IS NOT CONTROLLED WITH YOUR NAUSEA MEDICATION *UNUSUAL SHORTNESS OF BREATH *UNUSUAL BRUISING OR BLEEDING *URINARY PROBLEMS (pain or burning when urinating, or frequent urination) *BOWEL PROBLEMS (unusual diarrhea, constipation, pain near the anus) TENDERNESS IN MOUTH AND THROAT WITH OR WITHOUT PRESENCE OF ULCERS (sore throat, sores in mouth, or a toothache) UNUSUAL RASH, SWELLING OR PAIN  UNUSUAL VAGINAL DISCHARGE OR ITCHING   Items with * indicate a potential emergency and should be followed up as soon as possible or go to the Emergency Department if any problems should occur.  Please show the CHEMOTHERAPY ALERT CARD or IMMUNOTHERAPY  ALERT CARD at check-in to the Emergency Department and triage nurse.  Should you have questions after your visit or need to cancel or reschedule your appointment, please contact [facility] - A [facility]  Dept: [phone]  and follow the prompts.  Office hours are 8:00 a.m. to 4:30 p.m. Monday - Friday. Please note that voicemails left after 4:00 p.m. may not be returned until the following business day.  We are closed weekends and major holidays. You have access to a nurse at all times for urgent questions. Please call the main number to the clinic Dept: [phone] and follow the prompts.   For any non-urgent questions, you may also contact your provider using MyChart. We now offer e-Visits for anyone 88 and older to request care online for non-urgent symptoms. For details visit [email or web address].   Also download the MyChart app! Go to the app store, search MyChart, open the app, select [place], and log in with your MyChart username and password.

## 2024-05-11 LAB — KAPPA/LAMBDA LIGHT CHAINS
Kappa free light chain: 17.4 mg/L (ref 3.3–19.4)
Kappa, lambda light chain ratio: 0.75 (ref 0.26–1.65)
Lambda free light chains: 23.3 mg/L (ref 5.7–26.3)

## 2024-05-12 ENCOUNTER — Telehealth: Payer: Self-pay | Admitting: Infectious Diseases

## 2024-05-13 NOTE — Telephone Encounter (Signed)
 Error

## 2024-05-16 LAB — MULTIPLE MYELOMA PANEL, SERUM
Albumin SerPl Elph-Mcnc: 3.7 g/dL (ref 2.9–4.4)
Albumin/Glob SerPl: 1.5 (ref 0.7–1.7)
Alpha 1: 0.2 g/dL (ref 0.0–0.4)
Alpha2 Glob SerPl Elph-Mcnc: 0.7 g/dL (ref 0.4–1.0)
B-Globulin SerPl Elph-Mcnc: 0.9 g/dL (ref 0.7–1.3)
Gamma Glob SerPl Elph-Mcnc: 0.6 g/dL (ref 0.4–1.8)
Globulin, Total: 2.5 g/dL (ref 2.2–3.9)
IgA: 87 mg/dL (ref 61–437)
IgG (Immunoglobin G), Serum: 559 mg/dL — ABNORMAL LOW (ref 603–1613)
IgM (Immunoglobulin M), Srm: 65 mg/dL (ref 15–143)
Total Protein ELP: 6.2 g/dL (ref 6.0–8.5)

## 2024-05-23 ENCOUNTER — Inpatient Hospital Stay

## 2024-05-23 VITALS — BP 148/65 | HR 59 | Temp 98.2°F | Resp 17 | Wt 239.2 lb

## 2024-05-23 DIAGNOSIS — C9000 Multiple myeloma not having achieved remission: Secondary | ICD-10-CM

## 2024-05-23 DIAGNOSIS — Z5112 Encounter for antineoplastic immunotherapy: Secondary | ICD-10-CM | POA: Diagnosis not present

## 2024-05-23 LAB — CMP (CANCER CENTER ONLY)
ALT: 19 U/L (ref 0–44)
AST: 23 U/L (ref 15–41)
Albumin: 4.3 g/dL (ref 3.5–5.0)
Alkaline Phosphatase: 56 U/L (ref 38–126)
Anion gap: 7 (ref 5–15)
BUN: 21 mg/dL (ref 8–23)
CO2: 26 mmol/L (ref 22–32)
Calcium: 9.1 mg/dL (ref 8.9–10.3)
Chloride: 107 mmol/L (ref 98–111)
Creatinine: 2.52 mg/dL — ABNORMAL HIGH (ref 0.61–1.24)
GFR, Estimated: 25 mL/min — ABNORMAL LOW (ref >60)
Glucose, Bld: 93 mg/dL (ref 70–99)
Potassium: 4.2 mmol/L (ref 3.5–5.1)
Sodium: 140 mmol/L (ref 135–145)
Total Bilirubin: 0.5 mg/dL (ref 0.0–1.2)
Total Protein: 6.8 g/dL (ref 6.5–8.1)

## 2024-05-23 LAB — CBC WITH DIFFERENTIAL (CANCER CENTER ONLY)
Abs Immature Granulocytes: 0.01 K/uL (ref 0.00–0.07)
Basophils Absolute: 0.1 K/uL (ref 0.0–0.1)
Basophils Relative: 1 %
Eosinophils Absolute: 0.4 K/uL (ref 0.0–0.5)
Eosinophils Relative: 7 %
HCT: 33.4 % — ABNORMAL LOW (ref 39.0–52.0)
Hemoglobin: 10.6 g/dL — ABNORMAL LOW (ref 13.0–17.0)
Immature Granulocytes: 0 %
Lymphocytes Relative: 22 %
Lymphs Abs: 1.1 K/uL (ref 0.7–4.0)
MCH: 32.2 pg (ref 26.0–34.0)
MCHC: 31.7 g/dL (ref 30.0–36.0)
MCV: 101.5 fL — ABNORMAL HIGH (ref 80.0–100.0)
Monocytes Absolute: 0.8 K/uL (ref 0.1–1.0)
Monocytes Relative: 15 %
Neutro Abs: 2.8 K/uL (ref 1.7–7.7)
Neutrophils Relative %: 55 %
Platelet Count: 192 K/uL (ref 150–400)
RBC: 3.29 MIL/uL — ABNORMAL LOW (ref 4.22–5.81)
RDW: 14.8 % (ref 11.5–15.5)
WBC Count: 5.1 K/uL (ref 4.0–10.5)
nRBC: 0 % (ref 0.0–0.2)

## 2024-05-23 MED ORDER — BORTEZOMIB CHEMO SQ INJECTION 3.5 MG (2.5MG/ML)
1.3000 mg/m2 | Freq: Once | INTRAMUSCULAR | Status: AC
  Administered 2024-05-23: 3 mg via SUBCUTANEOUS
  Filled 2024-05-23: qty 1.2

## 2024-05-23 MED ORDER — PROCHLORPERAZINE MALEATE 10 MG PO TABS
10.0000 mg | ORAL_TABLET | Freq: Once | ORAL | Status: AC
  Administered 2024-05-23: 10 mg via ORAL
  Filled 2024-05-23: qty 1

## 2024-05-23 NOTE — Patient Instructions (Signed)
 [facility] - A DEPT OF [facility]   Discharge Instructions: Thank you for choosing [facility] to provide your oncology and hematology care.   If you have a lab appointment with the Cancer Center, please go directly to the Cancer Center and check in at the registration area.   Wear comfortable clothing and clothing appropriate for easy access to any Portacath or PICC line.   We strive to give you quality time with your provider. You may need to reschedule your appointment if you arrive late (15 or more minutes).  Arriving late affects you and other patients whose appointments are after yours.  Also, if you miss three or more appointments without notifying the office, you may be dismissed from the clinic at the provider's discretion.      For prescription refill requests, have your pharmacy contact our office and allow 72 hours for refills to be completed.    Today you received the following chemotherapy and/or immunotherapy agents: Bortezomib (Velcade)      To help prevent nausea and vomiting after your treatment, we encourage you to take your nausea medication as directed.  BELOW ARE SYMPTOMS THAT SHOULD BE REPORTED IMMEDIATELY: *FEVER GREATER THAN 100.4 F (38 C) OR HIGHER *CHILLS OR SWEATING *NAUSEA AND VOMITING THAT IS NOT CONTROLLED WITH YOUR NAUSEA MEDICATION *UNUSUAL SHORTNESS OF BREATH *UNUSUAL BRUISING OR BLEEDING *URINARY PROBLEMS (pain or burning when urinating, or frequent urination) *BOWEL PROBLEMS (unusual diarrhea, constipation, pain near the anus) TENDERNESS IN MOUTH AND THROAT WITH OR WITHOUT PRESENCE OF ULCERS (sore throat, sores in mouth, or a toothache) UNUSUAL RASH, SWELLING OR PAIN  UNUSUAL VAGINAL DISCHARGE OR ITCHING   Items with * indicate a potential emergency and should be followed up as soon as possible or go to the Emergency Department if any problems should occur.  Please show the CHEMOTHERAPY ALERT CARD or  IMMUNOTHERAPY ALERT CARD at check-in to the Emergency Department and triage nurse.  Should you have questions after your visit or need to cancel or reschedule your appointment, please contact [facility] - A DEPT OF [facility]  Dept: [phone]  and follow the prompts.  Office hours are 8:00 a.m. to 4:30 p.m. Monday - Friday. Please note that voicemails left after 4:00 p.m. may not be returned until the following business day.  We are closed weekends and major holidays. You have access to a nurse at all times for urgent questions. Please call the main number to the clinic Dept: [phone] and follow the prompts.   For any non-urgent questions, you may also contact your provider using MyChart. We now offer e-Visits for anyone 75 and older to request care online for non-urgent symptoms. For details visit [email or web address].   Also download the MyChart app! Go to the app store, search "MyChart", open the app, select , and log in with your MyChart username and password.

## 2024-05-24 LAB — KAPPA/LAMBDA LIGHT CHAINS
Kappa free light chain: 17.5 mg/L (ref 3.3–19.4)
Kappa, lambda light chain ratio: 0.76 (ref 0.26–1.65)
Lambda free light chains: 22.9 mg/L (ref 5.7–26.3)

## 2024-05-27 LAB — MULTIPLE MYELOMA PANEL, SERUM
Albumin SerPl Elph-Mcnc: 3.7 g/dL (ref 2.9–4.4)
Albumin/Glob SerPl: 1.5 (ref 0.7–1.7)
Alpha 1: 0.2 g/dL (ref 0.0–0.4)
Alpha2 Glob SerPl Elph-Mcnc: 0.7 g/dL (ref 0.4–1.0)
B-Globulin SerPl Elph-Mcnc: 0.9 g/dL (ref 0.7–1.3)
Gamma Glob SerPl Elph-Mcnc: 0.6 g/dL (ref 0.4–1.8)
Globulin, Total: 2.5 g/dL (ref 2.2–3.9)
IgA: 100 mg/dL (ref 61–437)
IgG (Immunoglobin G), Serum: 596 mg/dL — ABNORMAL LOW (ref 603–1613)
IgM (Immunoglobulin M), Srm: 69 mg/dL (ref 15–143)
Total Protein ELP: 6.2 g/dL (ref 6.0–8.5)

## 2024-06-06 ENCOUNTER — Other Ambulatory Visit: Payer: Self-pay | Admitting: Hematology

## 2024-06-06 ENCOUNTER — Encounter: Payer: Self-pay | Admitting: Hematology and Oncology

## 2024-06-06 NOTE — Progress Notes (Unsigned)
 [name] [phone]   [phone]  PROGRESS NOTE  Patient Care Team: [name], MD as PCP - General (Internal Medicine)  Hematological/Oncological History # IgA Lambda Multiple Myeloma 05/03/2015: last visit with Dr. Sherrod at the [facility]. Was followed for IgA lambda MGUS. 12/11/2021: labs show M protein 3.4, Kappa 19.2, Lambda 1576.4, ratio 0.01. Cr 3.47, Hgb 8.6, WBC 5.7, MCV 99, Plt 221 12/23/2021: establish care with Dr. Federico  01/16/2022: Bone marrow biopsy performed, showed a 60% cellular bone marrow predominantly comprised of plasma cells making up 70 to 80%, lambda restricted.  Myeloma FISH panel showed no evidence of abnormalities. 02/07/2022: Cycle 1 Day 1 of CyBorD chemotherapy.  03/09/2022-03/17/2022: hospitalized for fever/pneumonia.  03/24/2022: Cycle 2 Day 1 of CyBorD chemotherapy.  04/22/2022: Cycle 3 Day 1 of CyBorD chemotherapy 05/19/2022-05/26/2022: Delayed start of Cycle 4 Day 1 of CyBorD chemotherapy due to neutropenia.  06/03/2022: Cycle 4 Day 1 of CyBorD chemotherapy. 25% dose reduction of cyclophosphamide  due to cytopenias. 06/30/2022: Cycle 4 Day 22. Drop Velcade  dose to 1mg /m2 and Cyclophosphamide  200 mg/m2  07/07/2022: Cycle 5 Day 1 of CyBorD chemotherapy with dose reductions.  08/04/2022: Cycle 6 Day 1 of CyBorD chemotherapy with dose reductions.  09/09/2022: transition to maintenance dose Velcade  q 2 weeks.   Interval History:  Darren Hinton. 78 y.o. male with medical history significant for IgA lambda multiple myeloma who presents for a follow up visit. The patient's last visit was on 05/10/2024. In the interim since the last visit he has continued on maintenance Velcade .   On exam today Mr. Ramsaran reports he is tolerating treatment well without any new or concerning symptoms.  His energy is stable and he can complete his daily activities on his own.  His weight is unchanged.  He denies nausea, vomiting or bowel  habit changes.  He denies easy bruising or signs of active bleeding.  He has persistent neuropathy that is unchanged from prior.  He denies fevers, chills, night sweats, shortness of breath, chest pain, cough, headaches, dizziness or new bone/back pain.  He has no other complaints.  Rest of the 10 point ROS as below. MEDICAL HISTORY:  Past Medical History:  Diagnosis Date   Allergy    Anxiety    Arthritis    Depression    Heart murmur    Hyperlipidemia    Hyperplastic colon polyp    Hyperproteinemia    Hypertension    Hypothyroidism    Kidney disease    Mitral valve prolapse    Multiple myeloma (HCC)    Paroxysmal atrial fibrillation (HCC)    Thyroid disease     SURGICAL HISTORY: Past Surgical History:  Procedure Laterality Date   CARDIOVERSION N/A 03/14/2022   Procedure: CARDIOVERSION;  Surgeon: Mona Vinie BROCKS, MD;  Location: [facility] ENDOSCOPY;  Service: Cardiovascular;  Laterality: N/A;   TEE WITHOUT CARDIOVERSION N/A 03/14/2022   Procedure: TRANSESOPHAGEAL ECHOCARDIOGRAM (TEE);  Surgeon: Mona Vinie BROCKS, MD;  Location: [facility] ENDOSCOPY;  Service: Cardiovascular;  Laterality: N/A;    SOCIAL HISTORY: Social History   Socioeconomic History   Marital status: Married    Spouse name: Darren Hinton   Number of children: 2   Years of education: Not on file   Highest education level: Not on file  Occupational History   Occupation: retired  Tobacco Use   Smoking status: Former    Current packs/day: 0.00    Types: Cigarettes    Start date: 10/01/1958    Quit  date: 10/01/1978    Years since quitting: 45.7   Smokeless tobacco: Never   Tobacco comments:    Former smoker 04/03/22  Vaping Use   Vaping status: Never Used  Substance and Sexual Activity   Alcohol use: Not Currently    Alcohol/week: 2.0 standard drinks of alcohol    Types: 2 Glasses of wine per week    Comment: None in many years   Drug use: No   Sexual activity: Not on file  Other Topics Concern   Not on file  Social  History Narrative   Not on file   Social Drivers of Health   Financial Resource Strain: Not on file  Food Insecurity: No Food Insecurity (04/08/2024)   Hunger Vital Sign    Worried About Running Out of Food in the Last Year: Never true    Ran Out of Food in the Last Year: Never true  Transportation Needs: No Transportation Needs (04/08/2024)   PRAPARE - Administrator, Civil Service (Medical): No    Lack of Transportation (Non-Medical): No  Physical Activity: Not on file  Stress: Not on file  Social Connections: Socially Integrated (04/08/2024)   Social Connection and Isolation Panel    Frequency of Communication with Friends and Family: More than three times a week    Frequency of Social Gatherings with Friends and Family: Twice a week    Attends Religious Services: More than 4 times per year    Active Member of [employer] or Organizations: Yes    Attends Banker Meetings: 1 to 4 times per year    Marital Status: Married  Catering manager Violence: Not At Risk (04/08/2024)   Humiliation, Afraid, Rape, and Kick questionnaire    Fear of Current or Ex-Partner: No    Emotionally Abused: No    Physically Abused: No    Sexually Abused: No    FAMILY HISTORY: Family History  Problem Relation Age of Onset   Kidney disease Mother    Prostate cancer Father    Kidney disease Brother    Colon cancer Neg Hx    Esophageal cancer Neg Hx    Liver cancer Neg Hx    Pancreatic cancer Neg Hx    Rectal cancer Neg Hx    Stomach cancer Neg Hx     ALLERGIES:  is allergic to zithromax  [azithromycin ].  MEDICATIONS:  Current Outpatient Medications  Medication Sig Dispense Refill   acyclovir  (ZOVIRAX ) 400 MG tablet TAKE 1 TABLET BY MOUTH TWICE A DAY 180 tablet 1   allopurinol  (ZYLOPRIM ) 300 MG tablet TAKE 1 TABLET BY MOUTH EVERY DAY 90 tablet 1   ALPRAZolam  (XANAX ) 0.25 MG tablet Take 0.25 mg by mouth 2 (two) times daily as needed for anxiety.     amiodarone  (PACERONE ) 200 MG  tablet TAKE 1 TABLET BY MOUTH EVERY DAY 90 tablet 3   apixaban  (ELIQUIS ) 5 MG TABS tablet Take 1 tablet (5 mg total) by mouth 2 (two) times daily. 180 tablet 1   benzonatate  (TESSALON ) 100 MG capsule Take 1 capsule (100 mg total) by mouth every 8 (eight) hours. 21 capsule 0   dexamethasone  (DECADRON ) 4 MG tablet TAKE 10 TABLETS (40 MG TOTAL) BY MOUTH ONCE A WEEK IN THE MORNING ON CHEMOTHERAPY DAYS (Patient taking differently: Take 40 mg by mouth every 14 (fourteen) days. Every other Monday) 120 tablet 1   ferrous gluconate  (FERGON) 324 MG tablet Take 1 tablet (324 mg total) by mouth 3 (three) times daily with  meals. 90 tablet 0   fluticasone (FLONASE) 50 MCG/ACT nasal spray Place 2 sprays into the nose daily as needed for allergies.     gabapentin  (NEURONTIN ) 300 MG capsule TAKE 1 CAPSULE BY MOUTH TWICE A DAY 60 capsule 1   guaiFENesin  (MUCINEX ) 600 MG 12 hr tablet Take 600 mg by mouth 2 (two) times daily as needed for cough or to loosen phlegm.     levothyroxine  (SYNTHROID ) 125 MCG tablet Take 125 mcg by mouth daily before breakfast. Every day except on Mondays     loratadine (CLARITIN) 10 MG tablet Take 10 mg by mouth daily as needed for allergies.     meclizine  (ANTIVERT ) 25 MG tablet Take 1 tablet (25 mg total) by mouth 3 (three) times daily as needed for dizziness. 30 tablet 0   metoprolol  succinate (TOPROL -XL) 25 MG 24 hr tablet Take 0.5 tablets (12.5 mg total) by mouth daily. Take with or immediately following a meal.     Olmesartan-amLODIPine -HCTZ 20-5-12.5 MG TABS Take 1 tablet by mouth daily.     rosuvastatin (CRESTOR) 20 MG tablet Take 20 mg by mouth daily.     senna-docusate (SENOKOT-S) 8.6-50 MG tablet Take 1 tablet by mouth at bedtime as needed for mild constipation.     traMADol  (ULTRAM ) 50 MG tablet Take 1 tablet (50 mg total) by mouth every 12 (twelve) hours as needed.     vitamin B-12 (CYANOCOBALAMIN ) 500 MCG tablet Take 500 mcg by mouth in the morning.     No current  facility-administered medications for this visit.    REVIEW OF SYSTEMS:   Constitutional: ( - ) fevers, ( - )  chills , ( - ) night sweats Eyes: ( - ) blurriness of vision, ( - ) double vision, ( - ) watery eyes Ears, nose, mouth, throat, and face: ( - ) mucositis, ( - ) sore throat Respiratory: ( - ) cough, ( - ) dyspnea, ( - ) wheezes Cardiovascular: ( - ) palpitation, ( - ) chest discomfort, (+ ) lower extremity swelling Gastrointestinal:  ( - ) nausea, ( - ) heartburn, ( - ) change in bowel habits Skin: ( - ) abnormal skin rashes Lymphatics: ( - ) new lymphadenopathy, ( - ) easy bruising Neurological: ( + ) numbness, ( - ) tingling, ( - ) new weaknesses Behavioral/Psych: ( - ) mood change, ( - ) new changes  All other systems were reviewed with the patient and are negative.  PHYSICAL EXAMINATION: ECOG PERFORMANCE STATUS: 1 - Symptomatic but completely ambulatory  Vitals:   06/07/24 0850  BP: 128/63  Pulse: (!) 53  Resp: 13  Temp: 97.9 F (36.6 C)  SpO2: 97%        Filed Weights   06/07/24 0850  Weight: 243 lb 4.8 oz (110.4 kg)       GENERAL: Chronically ill-appearing elderly African-American male, alert, no distress and comfortable SKIN: skin color, texture, turgor are normal, no rashes or significant lesions EYES: conjunctiva are pink and non-injected, sclera clear LUNGS: clear to auscultation and percussion with normal breathing effort HEART: regular rate & rhythm and no murmurs and +1 bilateral ankle edema, wearing compression stockings.  Musculoskeletal: no cyanosis of digits and no clubbing  PSYCH: alert & oriented x 3, fluent speech NEURO: no focal motor/sensory deficits  LABORATORY DATA:  I have reviewed the data as listed    Latest Ref Rng & Units 06/07/2024    8:18 AM 05/23/2024    8:10 AM 05/10/2024  8:33 AM  CBC  WBC 4.0 - 10.5 K/uL 5.1  5.1  4.9   Hemoglobin 13.0 - 17.0 g/dL 89.3  89.3  89.4   Hematocrit 39.0 - 52.0 % 32.6  33.4  32.5    Platelets 150 - 400 K/uL 207  192  182        Latest Ref Rng & Units 06/07/2024    8:18 AM 05/23/2024    8:10 AM 05/10/2024    8:33 AM  CMP  Glucose 70 - 99 mg/dL 94  93  89   BUN 8 - 23 mg/dL 20  21  20    Creatinine 0.61 - 1.24 mg/dL 7.51  7.47  7.24   Sodium 135 - 145 mmol/L 140  140  141   Potassium 3.5 - 5.1 mmol/L 4.0  4.2  4.1   Chloride 98 - 111 mmol/L 107  107  108   CO2 22 - 32 mmol/L 26  26  26    Calcium 8.9 - 10.3 mg/dL 8.9  9.1  9.0   Total Protein 6.5 - 8.1 g/dL 6.5  6.8  6.6   Total Bilirubin 0.0 - 1.2 mg/dL 0.4  0.5  0.4   Alkaline Phos 38 - 126 U/L 58  56  59   AST 15 - 41 U/L 24  23  26    ALT 0 - 44 U/L 20  19  24      Lab Results  Component Value Date   MPROTEIN Not Observed 05/23/2024   MPROTEIN Not Observed 05/10/2024   MPROTEIN Not Observed 04/25/2024   Lab Results  Component Value Date   KPAFRELGTCHN 17.5 05/23/2024   KPAFRELGTCHN 17.4 05/10/2024   KPAFRELGTCHN 24.2 (H) 04/25/2024   LAMBDASER 22.9 05/23/2024   LAMBDASER 23.3 05/10/2024   LAMBDASER 28.1 (H) 04/25/2024   KAPLAMBRATIO 0.76 05/23/2024   KAPLAMBRATIO 0.75 05/10/2024   KAPLAMBRATIO 0.86 04/25/2024    RADIOGRAPHIC STUDIES: No results found.   ASSESSMENT & PLAN Darren Hinton. Is a 78 y.o. male with medical history significant for IgA lambda multiple myeloma who presents for a follow up visit. .    # IgA lambda Multiple Myeloma -- Bone marrow biopsy performed on 01/16/2022 showed increased plasma cells consistent with a plasma cell neoplasm.  Normal FISH panel. --Patient meets diagnostic criteria based on kidney dysfunction and anemia. --Cycle 1 Day 1 of CyBorD chemotherapy started on 02/07/2022 --Velcade  maintenance therapy started on 09/09/2022.  Plan: --Due for Cycle 45, Day 1 of maintenance Velcade  --Labs today show WBC 5.1, hemoglobin 10.6, platelets 207, creatinine stable at 2.48, calcium normal, LFTs normal. --Most recent SPEP from 05/23/2024 showed M protein not detected and  serum free light chains are normal. --continue on maintenance Velcade  every 2 weeks with monthly clinic visits.  # Leg Pain -- Likely a component of neuropathy from his chemotherapy with pre-existing sciatica and radiculopathy pain. -- continue gabapentin  to 300 mg twice daily -- continue tramadol  50 mg every 6 hours as needed -- patient connected with Dr. Buckley  #Supportive Care -- chemotherapy education complete -- port placement not required.  -- zofran  8mg  q8H PRN and compazine  10mg  PO q6H for nausea -- acyclovir  400mg  PO BID for VCZ prophylaxis --zometa to start after dental clearance.   No orders of the defined types were placed in this encounter.  All questions were answered. The patient knows to call the clinic with any problems, questions or concerns.  I have spent a total of 30 minutes minutes of  face-to-face and non-face-to-face time, preparing to see the patient, performing a medically appropriate examination, counseling and educating the patient, documenting clinical information in the electronic health record,  and care coordination.   Johnston Police PA-C Dept of Hematology and Oncology [facility] Phone: [phone]    06/07/2024 1:45 PM

## 2024-06-07 ENCOUNTER — Inpatient Hospital Stay

## 2024-06-07 ENCOUNTER — Inpatient Hospital Stay: Admitting: Physician Assistant

## 2024-06-07 VITALS — BP 128/63 | HR 53 | Temp 97.9°F | Resp 13 | Wt 243.3 lb

## 2024-06-07 DIAGNOSIS — C9000 Multiple myeloma not having achieved remission: Secondary | ICD-10-CM

## 2024-06-07 DIAGNOSIS — Z5112 Encounter for antineoplastic immunotherapy: Secondary | ICD-10-CM | POA: Diagnosis not present

## 2024-06-07 LAB — CBC WITH DIFFERENTIAL (CANCER CENTER ONLY)
Abs Immature Granulocytes: 0.01 K/uL (ref 0.00–0.07)
Basophils Absolute: 0 K/uL (ref 0.0–0.1)
Basophils Relative: 1 %
Eosinophils Absolute: 0.2 K/uL (ref 0.0–0.5)
Eosinophils Relative: 5 %
HCT: 32.6 % — ABNORMAL LOW (ref 39.0–52.0)
Hemoglobin: 10.6 g/dL — ABNORMAL LOW (ref 13.0–17.0)
Immature Granulocytes: 0 %
Lymphocytes Relative: 21 %
Lymphs Abs: 1.1 K/uL (ref 0.7–4.0)
MCH: 32.4 pg (ref 26.0–34.0)
MCHC: 32.5 g/dL (ref 30.0–36.0)
MCV: 99.7 fL (ref 80.0–100.0)
Monocytes Absolute: 0.8 K/uL (ref 0.1–1.0)
Monocytes Relative: 16 %
Neutro Abs: 2.9 K/uL (ref 1.7–7.7)
Neutrophils Relative %: 57 %
Platelet Count: 207 K/uL (ref 150–400)
RBC: 3.27 MIL/uL — ABNORMAL LOW (ref 4.22–5.81)
RDW: 14.7 % (ref 11.5–15.5)
WBC Count: 5.1 K/uL (ref 4.0–10.5)
nRBC: 0 % (ref 0.0–0.2)

## 2024-06-07 LAB — CMP (CANCER CENTER ONLY)
ALT: 20 U/L (ref 0–44)
AST: 24 U/L (ref 15–41)
Albumin: 4.2 g/dL (ref 3.5–5.0)
Alkaline Phosphatase: 58 U/L (ref 38–126)
Anion gap: 7 (ref 5–15)
BUN: 20 mg/dL (ref 8–23)
CO2: 26 mmol/L (ref 22–32)
Calcium: 8.9 mg/dL (ref 8.9–10.3)
Chloride: 107 mmol/L (ref 98–111)
Creatinine: 2.48 mg/dL — ABNORMAL HIGH (ref 0.61–1.24)
GFR, Estimated: 26 mL/min — ABNORMAL LOW (ref >60)
Glucose, Bld: 94 mg/dL (ref 70–99)
Potassium: 4 mmol/L (ref 3.5–5.1)
Sodium: 140 mmol/L (ref 135–145)
Total Bilirubin: 0.4 mg/dL (ref 0.0–1.2)
Total Protein: 6.5 g/dL (ref 6.5–8.1)

## 2024-06-07 MED ORDER — BORTEZOMIB CHEMO SQ INJECTION 3.5 MG (2.5MG/ML)
1.3000 mg/m2 | Freq: Once | INTRAMUSCULAR | Status: AC
  Administered 2024-06-07: 3 mg via SUBCUTANEOUS
  Filled 2024-06-07: qty 1.2

## 2024-06-07 MED ORDER — PROCHLORPERAZINE MALEATE 10 MG PO TABS
10.0000 mg | ORAL_TABLET | Freq: Once | ORAL | Status: AC
  Administered 2024-06-07: 10 mg via ORAL
  Filled 2024-06-07: qty 1

## 2024-06-07 NOTE — Patient Instructions (Signed)
 [facility] - A DEPT OF [facility]   Discharge Instructions: Thank you for choosing [facility] to provide your oncology and hematology care.   If you have a lab appointment with the Cancer Center, please go directly to the Cancer Center and check in at the registration area.   Wear comfortable clothing and clothing appropriate for easy access to any Portacath or PICC line.   We strive to give you quality time with your provider. You may need to reschedule your appointment if you arrive late (15 or more minutes).  Arriving late affects you and other patients whose appointments are after yours.  Also, if you miss three or more appointments without notifying the office, you may be dismissed from the clinic at the provider's discretion.      For prescription refill requests, have your pharmacy contact our office and allow 72 hours for refills to be completed.    Today you received the following chemotherapy and/or immunotherapy agents: Bortezomib (Velcade)      To help prevent nausea and vomiting after your treatment, we encourage you to take your nausea medication as directed.  BELOW ARE SYMPTOMS THAT SHOULD BE REPORTED IMMEDIATELY: *FEVER GREATER THAN 100.4 F (38 C) OR HIGHER *CHILLS OR SWEATING *NAUSEA AND VOMITING THAT IS NOT CONTROLLED WITH YOUR NAUSEA MEDICATION *UNUSUAL SHORTNESS OF BREATH *UNUSUAL BRUISING OR BLEEDING *URINARY PROBLEMS (pain or burning when urinating, or frequent urination) *BOWEL PROBLEMS (unusual diarrhea, constipation, pain near the anus) TENDERNESS IN MOUTH AND THROAT WITH OR WITHOUT PRESENCE OF ULCERS (sore throat, sores in mouth, or a toothache) UNUSUAL RASH, SWELLING OR PAIN  UNUSUAL VAGINAL DISCHARGE OR ITCHING   Items with * indicate a potential emergency and should be followed up as soon as possible or go to the Emergency Department if any problems should occur.  Please show the CHEMOTHERAPY ALERT CARD or  IMMUNOTHERAPY ALERT CARD at check-in to the Emergency Department and triage nurse.  Should you have questions after your visit or need to cancel or reschedule your appointment, please contact [facility] - A DEPT OF [facility]  Dept: [phone]  and follow the prompts.  Office hours are 8:00 a.m. to 4:30 p.m. Monday - Friday. Please note that voicemails left after 4:00 p.m. may not be returned until the following business day.  We are closed weekends and major holidays. You have access to a nurse at all times for urgent questions. Please call the main number to the clinic Dept: [phone] and follow the prompts.   For any non-urgent questions, you may also contact your provider using MyChart. We now offer e-Visits for anyone 75 and older to request care online for non-urgent symptoms. For details visit [email or web address].   Also download the MyChart app! Go to the app store, search "MyChart", open the app, select , and log in with your MyChart username and password.

## 2024-06-08 LAB — KAPPA/LAMBDA LIGHT CHAINS
Kappa free light chain: 16.9 mg/L (ref 3.3–19.4)
Kappa, lambda light chain ratio: 0.67 (ref 0.26–1.65)
Lambda free light chains: 25.1 mg/L (ref 5.7–26.3)

## 2024-06-10 LAB — MULTIPLE MYELOMA PANEL, SERUM
Albumin SerPl Elph-Mcnc: 3.7 g/dL (ref 2.9–4.4)
Albumin/Glob SerPl: 1.7 (ref 0.7–1.7)
Alpha 1: 0.2 g/dL (ref 0.0–0.4)
Alpha2 Glob SerPl Elph-Mcnc: 0.7 g/dL (ref 0.4–1.0)
B-Globulin SerPl Elph-Mcnc: 0.9 g/dL (ref 0.7–1.3)
Gamma Glob SerPl Elph-Mcnc: 0.4 g/dL (ref 0.4–1.8)
Globulin, Total: 2.3 g/dL (ref 2.2–3.9)
IgA: 105 mg/dL (ref 61–437)
IgG (Immunoglobin G), Serum: 557 mg/dL — ABNORMAL LOW (ref 603–1613)
IgM (Immunoglobulin M), Srm: 61 mg/dL (ref 15–143)
Total Protein ELP: 6 g/dL (ref 6.0–8.5)

## 2024-06-20 ENCOUNTER — Other Ambulatory Visit: Payer: Self-pay | Admitting: Hematology and Oncology

## 2024-06-20 ENCOUNTER — Inpatient Hospital Stay

## 2024-06-20 ENCOUNTER — Inpatient Hospital Stay: Attending: Hematology and Oncology

## 2024-06-20 VITALS — BP 145/75 | HR 53 | Temp 98.5°F | Resp 16 | Wt 243.0 lb

## 2024-06-20 DIAGNOSIS — K30 Functional dyspepsia: Secondary | ICD-10-CM | POA: Diagnosis not present

## 2024-06-20 DIAGNOSIS — Z8042 Family history of malignant neoplasm of prostate: Secondary | ICD-10-CM | POA: Diagnosis not present

## 2024-06-20 DIAGNOSIS — Z8419 Family history of other disorders of kidney and ureter: Secondary | ICD-10-CM | POA: Diagnosis not present

## 2024-06-20 DIAGNOSIS — Z7901 Long term (current) use of anticoagulants: Secondary | ICD-10-CM | POA: Diagnosis not present

## 2024-06-20 DIAGNOSIS — Z881 Allergy status to other antibiotic agents status: Secondary | ICD-10-CM | POA: Insufficient documentation

## 2024-06-20 DIAGNOSIS — I480 Paroxysmal atrial fibrillation: Secondary | ICD-10-CM | POA: Diagnosis not present

## 2024-06-20 DIAGNOSIS — C9000 Multiple myeloma not having achieved remission: Secondary | ICD-10-CM | POA: Insufficient documentation

## 2024-06-20 DIAGNOSIS — Z87891 Personal history of nicotine dependence: Secondary | ICD-10-CM | POA: Diagnosis not present

## 2024-06-20 DIAGNOSIS — Z5112 Encounter for antineoplastic immunotherapy: Secondary | ICD-10-CM | POA: Insufficient documentation

## 2024-06-20 DIAGNOSIS — I341 Nonrheumatic mitral (valve) prolapse: Secondary | ICD-10-CM | POA: Diagnosis not present

## 2024-06-20 DIAGNOSIS — D649 Anemia, unspecified: Secondary | ICD-10-CM | POA: Diagnosis not present

## 2024-06-20 DIAGNOSIS — M5430 Sciatica, unspecified side: Secondary | ICD-10-CM | POA: Insufficient documentation

## 2024-06-20 DIAGNOSIS — Z860102 Personal history of hyperplastic colon polyps: Secondary | ICD-10-CM | POA: Diagnosis not present

## 2024-06-20 DIAGNOSIS — Z79899 Other long term (current) drug therapy: Secondary | ICD-10-CM | POA: Insufficient documentation

## 2024-06-20 LAB — CBC WITH DIFFERENTIAL (CANCER CENTER ONLY)
Abs Immature Granulocytes: 0.01 K/uL (ref 0.00–0.07)
Basophils Absolute: 0 K/uL (ref 0.0–0.1)
Basophils Relative: 1 %
Eosinophils Absolute: 0.2 K/uL (ref 0.0–0.5)
Eosinophils Relative: 5 %
HCT: 33.4 % — ABNORMAL LOW (ref 39.0–52.0)
Hemoglobin: 10.8 g/dL — ABNORMAL LOW (ref 13.0–17.0)
Immature Granulocytes: 0 %
Lymphocytes Relative: 23 %
Lymphs Abs: 1.1 K/uL (ref 0.7–4.0)
MCH: 32.3 pg (ref 26.0–34.0)
MCHC: 32.3 g/dL (ref 30.0–36.0)
MCV: 100 fL (ref 80.0–100.0)
Monocytes Absolute: 0.8 K/uL (ref 0.1–1.0)
Monocytes Relative: 15 %
Neutro Abs: 2.7 K/uL (ref 1.7–7.7)
Neutrophils Relative %: 56 %
Platelet Count: 198 K/uL (ref 150–400)
RBC: 3.34 MIL/uL — ABNORMAL LOW (ref 4.22–5.81)
RDW: 14.9 % (ref 11.5–15.5)
WBC Count: 4.9 K/uL (ref 4.0–10.5)
nRBC: 0 % (ref 0.0–0.2)

## 2024-06-20 LAB — CMP (CANCER CENTER ONLY)
ALT: 19 U/L (ref 0–44)
AST: 22 U/L (ref 15–41)
Albumin: 4.3 g/dL (ref 3.5–5.0)
Alkaline Phosphatase: 49 U/L (ref 38–126)
Anion gap: 6 (ref 5–15)
BUN: 17 mg/dL (ref 8–23)
CO2: 26 mmol/L (ref 22–32)
Calcium: 9.5 mg/dL (ref 8.9–10.3)
Chloride: 109 mmol/L (ref 98–111)
Creatinine: 2.21 mg/dL — ABNORMAL HIGH (ref 0.61–1.24)
GFR, Estimated: 30 mL/min — ABNORMAL LOW (ref >60)
Glucose, Bld: 95 mg/dL (ref 70–99)
Potassium: 4.7 mmol/L (ref 3.5–5.1)
Sodium: 141 mmol/L (ref 135–145)
Total Bilirubin: 0.5 mg/dL (ref 0.0–1.2)
Total Protein: 6.6 g/dL (ref 6.5–8.1)

## 2024-06-20 MED ORDER — PROCHLORPERAZINE MALEATE 10 MG PO TABS
10.0000 mg | ORAL_TABLET | Freq: Once | ORAL | Status: AC
  Administered 2024-06-20: 10 mg via ORAL
  Filled 2024-06-20: qty 1

## 2024-06-20 MED ORDER — BORTEZOMIB CHEMO SQ INJECTION 3.5 MG (2.5MG/ML)
1.3000 mg/m2 | Freq: Once | INTRAMUSCULAR | Status: AC
  Administered 2024-06-20: 3 mg via SUBCUTANEOUS
  Filled 2024-06-20: qty 1.2

## 2024-06-20 NOTE — Patient Instructions (Signed)
 [facility]  Discharge Instructions: Thank you for choosing [facility] to provide your oncology and hematology care.   If you have a lab appointment with the Cancer Center, please go directly to the Cancer Center and check in at the registration area.   Wear comfortable clothing and clothing appropriate for easy access to any Portacath or PICC line.   We strive to give you quality time with your provider. You may need to reschedule your appointment if you arrive late (15 or more minutes).  Arriving late affects you and other patients whose appointments are after yours.  Also, if you miss three or more appointments without notifying the office, you may be dismissed from the clinic at the provider's discretion.      For prescription refill requests, have your pharmacy contact our office and allow 72 hours for refills to be completed.    Today you received the following chemotherapy and/or immunotherapy agents velcade      To help prevent nausea and vomiting after your treatment, we encourage you to take your nausea medication as directed.  BELOW ARE SYMPTOMS THAT SHOULD BE REPORTED IMMEDIATELY: *FEVER GREATER THAN 100.4 F (38 C) OR HIGHER *CHILLS OR SWEATING *NAUSEA AND VOMITING THAT IS NOT CONTROLLED WITH YOUR NAUSEA MEDICATION *UNUSUAL SHORTNESS OF BREATH *UNUSUAL BRUISING OR BLEEDING *URINARY PROBLEMS (pain or burning when urinating, or frequent urination) *BOWEL PROBLEMS (unusual diarrhea, constipation, pain near the anus) TENDERNESS IN MOUTH AND THROAT WITH OR WITHOUT PRESENCE OF ULCERS (sore throat, sores in mouth, or a toothache) UNUSUAL RASH, SWELLING OR PAIN  UNUSUAL VAGINAL DISCHARGE OR ITCHING   Items with * indicate a potential emergency and should be followed up as soon as possible or go to the Emergency Department if any problems should occur.  Please show the CHEMOTHERAPY ALERT CARD or IMMUNOTHERAPY  ALERT CARD at check-in to the Emergency Department and triage nurse.  Should you have questions after your visit or need to cancel or reschedule your appointment, please contact [facility]  Dept: [phone]  and follow the prompts.  Office hours are 8:00 a.m. to 4:30 p.m. Monday - Friday. Please note that voicemails left after 4:00 p.m. may not be returned until the following business day.  We are closed weekends and major holidays. You have access to a nurse at all times for urgent questions. Please call the main number to the clinic Dept: [phone] and follow the prompts.   For any non-urgent questions, you may also contact your provider using MyChart. We now offer e-Visits for anyone 85 and older to request care online for non-urgent symptoms. For details visit [email or web address].   Also download the MyChart app! Go to the app store, search "MyChart", open the app, select [place], and log in with your MyChart username and password.

## 2024-06-21 ENCOUNTER — Other Ambulatory Visit: Payer: Self-pay

## 2024-06-21 LAB — KAPPA/LAMBDA LIGHT CHAINS
Kappa free light chain: 13.8 mg/L (ref 3.3–19.4)
Kappa, lambda light chain ratio: 0.59 (ref 0.26–1.65)
Lambda free light chains: 23.4 mg/L (ref 5.7–26.3)

## 2024-06-24 LAB — MULTIPLE MYELOMA PANEL, SERUM
Albumin SerPl Elph-Mcnc: 3.8 g/dL (ref 2.9–4.4)
Albumin/Glob SerPl: 1.7 (ref 0.7–1.7)
Alpha 1: 0.2 g/dL (ref 0.0–0.4)
Alpha2 Glob SerPl Elph-Mcnc: 0.7 g/dL (ref 0.4–1.0)
B-Globulin SerPl Elph-Mcnc: 0.9 g/dL (ref 0.7–1.3)
Gamma Glob SerPl Elph-Mcnc: 0.4 g/dL (ref 0.4–1.8)
Globulin, Total: 2.3 g/dL (ref 2.2–3.9)
IgA: 108 mg/dL (ref 61–437)
IgG (Immunoglobin G), Serum: 524 mg/dL — ABNORMAL LOW (ref 603–1613)
IgM (Immunoglobulin M), Srm: 65 mg/dL (ref 15–143)
Total Protein ELP: 6.1 g/dL (ref 6.0–8.5)

## 2024-07-04 ENCOUNTER — Inpatient Hospital Stay (HOSPITAL_BASED_OUTPATIENT_CLINIC_OR_DEPARTMENT_OTHER): Admitting: Hematology and Oncology

## 2024-07-04 ENCOUNTER — Inpatient Hospital Stay

## 2024-07-04 VITALS — BP 122/70 | HR 54 | Temp 98.1°F | Resp 14 | Wt 239.5 lb

## 2024-07-04 DIAGNOSIS — C9000 Multiple myeloma not having achieved remission: Secondary | ICD-10-CM | POA: Diagnosis not present

## 2024-07-04 DIAGNOSIS — N184 Chronic kidney disease, stage 4 (severe): Secondary | ICD-10-CM

## 2024-07-04 DIAGNOSIS — D631 Anemia in chronic kidney disease: Secondary | ICD-10-CM | POA: Diagnosis not present

## 2024-07-04 DIAGNOSIS — Z5112 Encounter for antineoplastic immunotherapy: Secondary | ICD-10-CM | POA: Diagnosis not present

## 2024-07-04 LAB — CBC WITH DIFFERENTIAL (CANCER CENTER ONLY)
Abs Immature Granulocytes: 0.02 K/uL (ref 0.00–0.07)
Basophils Absolute: 0 K/uL (ref 0.0–0.1)
Basophils Relative: 1 %
Eosinophils Absolute: 0.3 K/uL (ref 0.0–0.5)
Eosinophils Relative: 7 %
HCT: 34.1 % — ABNORMAL LOW (ref 39.0–52.0)
Hemoglobin: 11.1 g/dL — ABNORMAL LOW (ref 13.0–17.0)
Immature Granulocytes: 0 %
Lymphocytes Relative: 25 %
Lymphs Abs: 1.2 K/uL (ref 0.7–4.0)
MCH: 32.4 pg (ref 26.0–34.0)
MCHC: 32.6 g/dL (ref 30.0–36.0)
MCV: 99.4 fL (ref 80.0–100.0)
Monocytes Absolute: 0.9 K/uL (ref 0.1–1.0)
Monocytes Relative: 19 %
Neutro Abs: 2.4 K/uL (ref 1.7–7.7)
Neutrophils Relative %: 48 %
Platelet Count: 190 K/uL (ref 150–400)
RBC: 3.43 MIL/uL — ABNORMAL LOW (ref 4.22–5.81)
RDW: 14.8 % (ref 11.5–15.5)
WBC Count: 4.9 K/uL (ref 4.0–10.5)
nRBC: 0 % (ref 0.0–0.2)

## 2024-07-04 LAB — CMP (CANCER CENTER ONLY)
ALT: 58 U/L — ABNORMAL HIGH (ref 0–44)
AST: 54 U/L — ABNORMAL HIGH (ref 15–41)
Albumin: 4.1 g/dL (ref 3.5–5.0)
Alkaline Phosphatase: 54 U/L (ref 38–126)
Anion gap: 8 (ref 5–15)
BUN: 22 mg/dL (ref 8–23)
CO2: 25 mmol/L (ref 22–32)
Calcium: 9 mg/dL (ref 8.9–10.3)
Chloride: 108 mmol/L (ref 98–111)
Creatinine: 2.68 mg/dL — ABNORMAL HIGH (ref 0.61–1.24)
GFR, Estimated: 24 mL/min — ABNORMAL LOW (ref >60)
Glucose, Bld: 95 mg/dL (ref 70–99)
Potassium: 4.1 mmol/L (ref 3.5–5.1)
Sodium: 141 mmol/L (ref 135–145)
Total Bilirubin: 0.5 mg/dL (ref 0.0–1.2)
Total Protein: 6.7 g/dL (ref 6.5–8.1)

## 2024-07-04 MED ORDER — BORTEZOMIB CHEMO SQ INJECTION 3.5 MG (2.5MG/ML)
1.3000 mg/m2 | Freq: Once | INTRAMUSCULAR | Status: AC
  Administered 2024-07-04: 3 mg via SUBCUTANEOUS
  Filled 2024-07-04: qty 1.2

## 2024-07-04 MED ORDER — PROCHLORPERAZINE MALEATE 10 MG PO TABS
10.0000 mg | ORAL_TABLET | Freq: Once | ORAL | Status: AC
  Administered 2024-07-04: 10 mg via ORAL
  Filled 2024-07-04: qty 1

## 2024-07-04 NOTE — Patient Instructions (Signed)
 [facility]  Discharge Instructions: Thank you for choosing [facility] to provide your oncology and hematology care.   If you have a lab appointment with the Cancer Center, please go directly to the Cancer Center and check in at the registration area.   Wear comfortable clothing and clothing appropriate for easy access to any Portacath or PICC line.   We strive to give you quality time with your provider. You may need to reschedule your appointment if you arrive late (15 or more minutes).  Arriving late affects you and other patients whose appointments are after yours.  Also, if you miss three or more appointments without notifying the office, you may be dismissed from the clinic at the provider's discretion.      For prescription refill requests, have your pharmacy contact our office and allow 72 hours for refills to be completed.    Today you received the following chemotherapy and/or immunotherapy agents velcade      To help prevent nausea and vomiting after your treatment, we encourage you to take your nausea medication as directed.  BELOW ARE SYMPTOMS THAT SHOULD BE REPORTED IMMEDIATELY: *FEVER GREATER THAN 100.4 F (38 C) OR HIGHER *CHILLS OR SWEATING *NAUSEA AND VOMITING THAT IS NOT CONTROLLED WITH YOUR NAUSEA MEDICATION *UNUSUAL SHORTNESS OF BREATH *UNUSUAL BRUISING OR BLEEDING *URINARY PROBLEMS (pain or burning when urinating, or frequent urination) *BOWEL PROBLEMS (unusual diarrhea, constipation, pain near the anus) TENDERNESS IN MOUTH AND THROAT WITH OR WITHOUT PRESENCE OF ULCERS (sore throat, sores in mouth, or a toothache) UNUSUAL RASH, SWELLING OR PAIN  UNUSUAL VAGINAL DISCHARGE OR ITCHING   Items with * indicate a potential emergency and should be followed up as soon as possible or go to the Emergency Department if any problems should occur.  Please show the CHEMOTHERAPY ALERT CARD or IMMUNOTHERAPY  ALERT CARD at check-in to the Emergency Department and triage nurse.  Should you have questions after your visit or need to cancel or reschedule your appointment, please contact [facility]  Dept: [phone]  and follow the prompts.  Office hours are 8:00 a.m. to 4:30 p.m. Monday - Friday. Please note that voicemails left after 4:00 p.m. may not be returned until the following business day.  We are closed weekends and major holidays. You have access to a nurse at all times for urgent questions. Please call the main number to the clinic Dept: [phone] and follow the prompts.   For any non-urgent questions, you may also contact your provider using MyChart. We now offer e-Visits for anyone 85 and older to request care online for non-urgent symptoms. For details visit [email or web address].   Also download the MyChart app! Go to the app store, search "MyChart", open the app, select [place], and log in with your MyChart username and password.

## 2024-07-04 NOTE — Progress Notes (Signed)
 [facility] [phone]   [phone]  PROGRESS NOTE  Patient Care Team: Darren Toribio MATSU, MD as PCP - General (Internal Medicine)  Hematological/Oncological History # IgA Lambda Multiple Myeloma 05/03/2015: last visit with Darren Hinton at the [facility]. Was followed for IgA lambda MGUS. 12/11/2021: labs show M protein 3.4, Kappa 19.2, Lambda 1576.4, ratio 0.01. Cr 3.47, Hgb 8.6, WBC 5.7, MCV 99, Plt 221 12/23/2021: establish care with Darren Hinton  01/16/2022: Bone marrow biopsy performed, showed a 60% cellular bone marrow predominantly comprised of plasma cells making up 70 to 80%, lambda restricted.  Myeloma FISH panel showed no evidence of abnormalities. 02/07/2022: Cycle 1 Day 1 of CyBorD chemotherapy.  03/09/2022-03/17/2022: hospitalized for fever/pneumonia.  03/24/2022: Cycle 2 Day 1 of CyBorD chemotherapy.  04/22/2022: Cycle 3 Day 1 of CyBorD chemotherapy 05/19/2022-05/26/2022: Delayed start of Cycle 4 Day 1 of CyBorD chemotherapy due to neutropenia.  06/03/2022: Cycle 4 Day 1 of CyBorD chemotherapy. 25% dose reduction of cyclophosphamide  due to cytopenias. 06/30/2022: Cycle 4 Day 22. Drop Velcade  dose to 1mg /m2 and Cyclophosphamide  200 mg/m2  07/07/2022: Cycle 5 Day 1 of CyBorD chemotherapy with dose reductions.  08/04/2022: Cycle 6 Day 1 of CyBorD chemotherapy with dose reductions.  09/09/2022: transition to maintenance dose Velcade  q 2 weeks.   Interval History:  Darren Hinton. 78 y.o. male with medical history significant for IgA lambda multiple myeloma who presents for a follow up visit. The patient's last visit was on 06/07/2024. In the interim since the last visit he has continued on maintenance Velcade .   On exam today Darren Hinton reports he has been good overall in the room since our last visit.  He reports he did get a COVID booster last week and unfortunately did have some GI upset over the weekend.  He reports since that time it has smoothed  out.  He reports that his stomach was not right.  He was not eating as much but was not having loose stools or diarrhea.  He reports that he tolerated his chemotherapy shots well with no numbness or tingling of his fingers or toes.  He reports that he does have his usual sciatica pain which limits his mobility.  He is careful when he walks but has not had any falls.  His appetite remains strong though he has lost a few pounds in the interim since our last visit.  He denies any itching or redness at the site of his injection.  He otherwise denies any fevers, chills, sweats nausea, vomiting or diarrhea.  Denies any fevers, chills, sweats.  Full 10 point ROS is otherwise negative.  He is willing and able to continue with chemotherapy treatment this time.  MEDICAL HISTORY:  Past Medical History:  Diagnosis Date   Allergy    Anxiety    Arthritis    Depression    Heart murmur    Hyperlipidemia    Hyperplastic colon polyp    Hyperproteinemia    Hypertension    Hypothyroidism    Kidney disease    Mitral valve prolapse    Multiple myeloma (HCC)    Paroxysmal atrial fibrillation (HCC)    Thyroid disease     SURGICAL HISTORY: Past Surgical History:  Procedure Laterality Date   CARDIOVERSION N/A 03/14/2022   Procedure: CARDIOVERSION;  Surgeon: Darren Vinie BROCKS, MD;  Location: [facility] ENDOSCOPY;  Service: Cardiovascular;  Laterality: N/A;   TEE WITHOUT CARDIOVERSION N/A 03/14/2022   Procedure: TRANSESOPHAGEAL ECHOCARDIOGRAM (TEE);  Surgeon: Darren,  Vinie BROCKS, MD;  Location: [facility] ENDOSCOPY;  Service: Cardiovascular;  Laterality: N/A;    SOCIAL HISTORY: Social History   Socioeconomic History   Marital status: Married    Spouse name: Darren Hinton   Number of children: 2   Years of education: Not on file   Highest education level: Not on file  Occupational History   Occupation: retired  Tobacco Use   Smoking status: Former    Current packs/day: 0.00    Types: Cigarettes    Start date: 10/01/1958    Quit  date: 10/01/1978    Years since quitting: 45.7   Smokeless tobacco: Never   Tobacco comments:    Former smoker 04/03/22  Vaping Use   Vaping status: Never Used  Substance and Sexual Activity   Alcohol use: Not Currently    Alcohol/week: 2.0 standard drinks of alcohol    Types: 2 Glasses of wine per week    Comment: None in many years   Drug use: No   Sexual activity: Not on file  Other Topics Concern   Not on file  Social History Narrative   Not on file   Social Drivers of Health   Financial Resource Strain: Not on file  Food Insecurity: No Food Insecurity (04/08/2024)   Hunger Vital Sign    Worried About Running Out of Food in the Last Year: Never true    Ran Out of Food in the Last Year: Never true  Transportation Needs: No Transportation Needs (04/08/2024)   PRAPARE - Administrator, Civil Service (Medical): No    Lack of Transportation (Non-Medical): No  Physical Activity: Not on file  Stress: Not on file  Social Connections: Socially Integrated (04/08/2024)   Social Connection and Isolation Panel    Frequency of Communication with Friends and Family: More than three times a week    Frequency of Social Gatherings with Friends and Family: Twice a week    Attends Religious Services: More than 4 times per year    Active Member of [employer] or Organizations: Yes    Attends Banker Meetings: 1 to 4 times per year    Marital Status: Married  Catering Manager Violence: Not At Risk (04/08/2024)   Humiliation, Afraid, Rape, and Kick questionnaire    Fear of Current or Ex-Partner: No    Emotionally Abused: No    Physically Abused: No    Sexually Abused: No    FAMILY HISTORY: Family History  Problem Relation Age of Onset   Kidney disease Mother    Prostate cancer Father    Kidney disease Brother    Colon cancer Neg Hx    Esophageal cancer Neg Hx    Liver cancer Neg Hx    Pancreatic cancer Neg Hx    Rectal cancer Neg Hx    Stomach cancer Neg Hx      ALLERGIES:  is allergic to zithromax  [azithromycin ].  MEDICATIONS:  Current Outpatient Medications  Medication Sig Dispense Refill   acyclovir  (ZOVIRAX ) 400 MG tablet TAKE 1 TABLET BY MOUTH TWICE A DAY 180 tablet 1   ALPRAZolam  (XANAX ) 0.25 MG tablet Take 0.25 mg by mouth 2 (two) times daily as needed for anxiety.     amiodarone  (PACERONE ) 200 MG tablet TAKE 1 TABLET BY MOUTH EVERY DAY 90 tablet 3   apixaban  (ELIQUIS ) 5 MG TABS tablet Take 1 tablet (5 mg total) by mouth 2 (two) times daily. 180 tablet 1   benzonatate  (TESSALON ) 100 MG capsule Take 1  capsule (100 mg total) by mouth every 8 (eight) hours. 21 capsule 0   dexamethasone  (DECADRON ) 4 MG tablet TAKE 10 TABLETS (40 MG TOTAL) BY MOUTH ONCE A WEEK IN THE MORNING ON CHEMOTHERAPY DAYS (Patient taking differently: Take 40 mg by mouth every 14 (fourteen) days. Every other Monday) 120 tablet 1   ferrous gluconate  (FERGON) 324 MG tablet Take 1 tablet (324 mg total) by mouth 3 (three) times daily with meals. 90 tablet 0   fluticasone (FLONASE) 50 MCG/ACT nasal spray Place 2 sprays into the nose daily as needed for allergies.     gabapentin  (NEURONTIN ) 300 MG capsule TAKE 1 CAPSULE BY MOUTH TWICE A DAY 60 capsule 1   guaiFENesin  (MUCINEX ) 600 MG 12 hr tablet Take 600 mg by mouth 2 (two) times daily as needed for cough or to loosen phlegm.     levothyroxine  (SYNTHROID ) 125 MCG tablet Take 125 mcg by mouth daily before breakfast. Every day except on Mondays     loratadine (CLARITIN) 10 MG tablet Take 10 mg by mouth daily as needed for allergies.     meclizine  (ANTIVERT ) 25 MG tablet Take 1 tablet (25 mg total) by mouth 3 (three) times daily as needed for dizziness. 30 tablet 0   metoprolol  succinate (TOPROL -XL) 25 MG 24 hr tablet Take 0.5 tablets (12.5 mg total) by mouth daily. Take with or immediately following a meal.     Olmesartan-amLODIPine -HCTZ 20-5-12.5 MG TABS Take 1 tablet by mouth daily.     rosuvastatin (CRESTOR) 20 MG tablet Take  20 mg by mouth daily.     senna-docusate (SENOKOT-S) 8.6-50 MG tablet Take 1 tablet by mouth at bedtime as needed for mild constipation.     traMADol  (ULTRAM ) 50 MG tablet Take 1 tablet (50 mg total) by mouth every 12 (twelve) hours as needed.     vitamin B-12 (CYANOCOBALAMIN ) 500 MCG tablet Take 500 mcg by mouth in the morning.     No current facility-administered medications for this visit.   Facility-Administered Medications Ordered in Other Visits  Medication Dose Route Frequency Provider Last Rate Last Admin   bortezomib  SQ (VELCADE ) chemo injection (2.5mg /mL concentration) 3 mg  1.3 mg/m2 (Treatment Plan Recorded) Subcutaneous Once Kerryann Allaire T IV, MD       prochlorperazine  (COMPAZINE ) tablet 10 mg  10 mg Oral Once Hinton Norleen ONEIDA MADISON, MD        REVIEW OF SYSTEMS:   Constitutional: ( - ) fevers, ( - )  chills , ( - ) night sweats Eyes: ( - ) blurriness of vision, ( - ) double vision, ( - ) watery eyes Ears, nose, mouth, throat, and face: ( - ) mucositis, ( - ) sore throat Respiratory: ( - ) cough, ( - ) dyspnea, ( - ) wheezes Cardiovascular: ( - ) palpitation, ( - ) chest discomfort, (+ ) lower extremity swelling Gastrointestinal:  ( - ) nausea, ( - ) heartburn, ( - ) change in bowel habits Skin: ( - ) abnormal skin rashes Lymphatics: ( - ) new lymphadenopathy, ( - ) easy bruising Neurological: ( + ) numbness, ( - ) tingling, ( - ) new weaknesses Behavioral/Psych: ( - ) mood change, ( - ) new changes  All other systems were reviewed with the patient and are negative.  PHYSICAL EXAMINATION: ECOG PERFORMANCE STATUS: 1 - Symptomatic but completely ambulatory  Vitals:   07/04/24 0852  BP: 122/70  Pulse: (!) 54  Resp: 14  Temp: 98.1 F (36.7 C)  SpO2: 99%         Filed Weights   07/04/24 0852  Weight: 239 lb 8 oz (108.6 kg)        GENERAL: Chronically ill-appearing elderly African-American male, alert, no distress and comfortable SKIN: skin color, texture,  turgor are normal, no rashes or significant lesions EYES: conjunctiva are pink and non-injected, sclera clear LUNGS: clear to auscultation and percussion with normal breathing effort HEART: regular rate & rhythm and no murmurs and +1 bilateral ankle edema, wearing compression stockings.  Musculoskeletal: no cyanosis of digits and no clubbing  PSYCH: alert & oriented x 3, fluent speech NEURO: no focal motor/sensory deficits  LABORATORY DATA:  I have reviewed the data as listed    Latest Ref Rng & Units 07/04/2024    8:21 AM 06/20/2024    8:25 AM 06/07/2024    8:18 AM  CBC  WBC 4.0 - 10.5 K/uL 4.9  4.9  5.1   Hemoglobin 13.0 - 17.0 g/dL 88.8  89.1  89.3   Hematocrit 39.0 - 52.0 % 34.1  33.4  32.6   Platelets 150 - 400 K/uL 190  198  207        Latest Ref Rng & Units 07/04/2024    8:21 AM 06/20/2024    8:25 AM 06/07/2024    8:18 AM  CMP  Glucose 70 - 99 mg/dL 95  95  94   BUN 8 - 23 mg/dL 22  17  20    Creatinine 0.61 - 1.24 mg/dL 7.31  7.78  7.51   Sodium 135 - 145 mmol/L 141  141  140   Potassium 3.5 - 5.1 mmol/L 4.1  4.7  4.0   Chloride 98 - 111 mmol/L 108  109  107   CO2 22 - 32 mmol/L 25  26  26    Calcium 8.9 - 10.3 mg/dL 9.0  9.5  8.9   Total Protein 6.5 - 8.1 g/dL 6.7  6.6  6.5   Total Bilirubin 0.0 - 1.2 mg/dL 0.5  0.5  0.4   Alkaline Phos 38 - 126 U/L 54  49  58   AST 15 - 41 U/L 54  22  24   ALT 0 - 44 U/L 58  19  20     Lab Results  Component Value Date   MPROTEIN Not Observed 06/20/2024   MPROTEIN Not Observed 06/07/2024   MPROTEIN Not Observed 05/23/2024   Lab Results  Component Value Date   KPAFRELGTCHN 13.8 06/20/2024   KPAFRELGTCHN 16.9 06/07/2024   KPAFRELGTCHN 17.5 05/23/2024   LAMBDASER 23.4 06/20/2024   LAMBDASER 25.1 06/07/2024   LAMBDASER 22.9 05/23/2024   KAPLAMBRATIO 0.59 06/20/2024   KAPLAMBRATIO 0.67 06/07/2024   KAPLAMBRATIO 0.76 05/23/2024    RADIOGRAPHIC STUDIES: No results found.   ASSESSMENT & PLAN Darren Hinton. Is a 78  y.o. male with medical history significant for IgA lambda multiple myeloma who presents for a follow up visit. .    # IgA lambda Multiple Myeloma -- Bone marrow biopsy performed on 01/16/2022 showed increased plasma cells consistent with a plasma cell neoplasm.  Normal FISH panel. --Patient meets diagnostic criteria based on kidney dysfunction and anemia. --Cycle 1 Day 1 of CyBorD chemotherapy started on 02/07/2022 --Velcade  maintenance therapy started on 09/09/2022.  Plan: --Due for Cycle 47, Day 1 of maintenance Velcade  --Labs today show WBC 4.9, Hgb 11.1, MCV 99.4, Plt 190, creatinine stable at 2.68, calcium normal, LFTs normal. --Most recent SPEP from 06/20/2024 showed  M protein not detected and serum free light chains are normal. --continue on maintenance Velcade  every 2 weeks with monthly clinic visits.  # Leg Pain -- Likely a component of neuropathy from his chemotherapy with pre-existing sciatica and radiculopathy pain. -- continue gabapentin  to 300 mg twice daily -- continue tramadol  50 mg every 6 hours as needed -- patient connected with Dr. Buckley  #Supportive Care -- chemotherapy education complete -- port placement not required.  -- zofran  8mg  q8H PRN and compazine  10mg  PO q6H for nausea -- acyclovir  400mg  PO BID for VCZ prophylaxis --zometa to start after dental clearance.   No orders of the defined types were placed in this encounter.  All questions were answered. The patient knows to call the clinic with any problems, questions or concerns.  I have spent a total of 30 minutes minutes of face-to-face and non-face-to-face time, preparing to see the patient, performing a medically appropriate examination, counseling and educating the patient, documenting clinical information in the electronic health record,  and care coordination.   Norleen IVAR Kidney, MD Department of Hematology/Oncology [facility] at [facility] [place] Phone: [phone] Pager:  [phone] Email: [email or web address]  07/04/2024 9:15 AM

## 2024-07-05 LAB — KAPPA/LAMBDA LIGHT CHAINS
Kappa free light chain: 16.9 mg/L (ref 3.3–19.4)
Kappa, lambda light chain ratio: 0.52 (ref 0.26–1.65)
Lambda free light chains: 32.4 mg/L — ABNORMAL HIGH (ref 5.7–26.3)

## 2024-07-13 LAB — MULTIPLE MYELOMA PANEL, SERUM
Albumin SerPl Elph-Mcnc: 3.7 g/dL (ref 2.9–4.4)
Albumin/Glob SerPl: 1.5 (ref 0.7–1.7)
Alpha 1: 0.3 g/dL (ref 0.0–0.4)
Alpha2 Glob SerPl Elph-Mcnc: 0.8 g/dL (ref 0.4–1.0)
B-Globulin SerPl Elph-Mcnc: 0.9 g/dL (ref 0.7–1.3)
Gamma Glob SerPl Elph-Mcnc: 0.5 g/dL (ref 0.4–1.8)
Globulin, Total: 2.5 g/dL (ref 2.2–3.9)
IgA: 120 mg/dL (ref 61–437)
IgG (Immunoglobin G), Serum: 531 mg/dL — ABNORMAL LOW (ref 603–1613)
IgM (Immunoglobulin M), Srm: 70 mg/dL (ref 15–143)
Total Protein ELP: 6.2 g/dL (ref 6.0–8.5)

## 2024-07-15 ENCOUNTER — Other Ambulatory Visit: Payer: Self-pay

## 2024-07-16 ENCOUNTER — Other Ambulatory Visit: Payer: Self-pay

## 2024-07-16 ENCOUNTER — Ambulatory Visit (INDEPENDENT_AMBULATORY_CARE_PROVIDER_SITE_OTHER)

## 2024-07-16 ENCOUNTER — Ambulatory Visit (HOSPITAL_COMMUNITY)
Admission: EM | Admit: 2024-07-16 | Discharge: 2024-07-16 | Disposition: A | Discharge status: Home / Self Care | Attending: Family Medicine | Admitting: Family Medicine

## 2024-07-16 ENCOUNTER — Encounter (HOSPITAL_COMMUNITY): Payer: Self-pay | Admitting: *Deleted

## 2024-07-16 DIAGNOSIS — J189 Pneumonia, unspecified organism: Secondary | ICD-10-CM

## 2024-07-16 DIAGNOSIS — R051 Acute cough: Secondary | ICD-10-CM

## 2024-07-16 MED ORDER — DOXYCYCLINE HYCLATE 100 MG PO CAPS: 100.0000 mg | ORAL_CAPSULE | Freq: Two times a day (BID) | ORAL | 0 refills | Status: AC

## 2024-07-16 MED ORDER — CEFPODOXIME PROXETIL 200 MG PO TABS: 200.0000 mg | ORAL_TABLET | Freq: Every day | ORAL | 0 refills | Status: AC

## 2024-07-16 NOTE — ED Provider Notes (Signed)
 [facility]    CSN: [id number] Arrival date & time: 07/16/24  1048      History   Chief Complaint Chief Complaint  Patient presents with   Sore Throat   Cough    HPI Darren Hinton. is a 78 y.o. male.    Sore Throat  Cough Associated symptoms: sore throat and wheezing    Patient is here for cough x 5 days.  Sore throat started just several days ago.  He does have h/o allergies, and was told to give mucinex .  This loosened things in his chest.  No fevers/chills.  Some wheezing yesterday, no sob noted.  No h/o asthma or copd.  He is undergoing chemo for multiple myeloma.        Past Medical History:  Diagnosis Date   Allergy    Anxiety    Arthritis    Depression    Heart murmur    Hyperlipidemia    Hyperplastic colon polyp    Hyperproteinemia    Hypertension    Hypothyroidism    Kidney disease    Mitral valve prolapse    Multiple myeloma (HCC)    Paroxysmal atrial fibrillation (HCC)    Thyroid disease     Patient Active Problem List   Diagnosis Date Noted   AKI (acute kidney injury) 04/11/2024   COVID-19 virus infection 04/11/2024   Bacteremia 04/08/2024   Iron  deficiency anemia due to chronic blood loss 04/20/2023   Chemotherapy-induced neuropathy 10/14/2022   Non-rheumatic mitral regurgitation 07/24/2022   Secondary hypercoagulable state 04/03/2022   Liver cyst    Tachycardia induced cardiomyopathy (HCC)    Pneumonia due to infectious organism    Atrial fibrillation with RVR (HCC)    SIRS (systemic inflammatory response syndrome) (HCC)    Fever 03/09/2022   Multiple myeloma not having achieved remission (HCC) 01/30/2022   Deficiency anemia 10/26/2014   Multiple myeloma (HCC) 04/06/2012    Past Surgical History:  Procedure Laterality Date   CARDIOVERSION N/A 03/14/2022   Procedure: CARDIOVERSION;  Surgeon: Mona Vinie BROCKS, MD;  Location: [facility] ENDOSCOPY;  Service: Cardiovascular;  Laterality: N/A;   TEE WITHOUT CARDIOVERSION  N/A 03/14/2022   Procedure: TRANSESOPHAGEAL ECHOCARDIOGRAM (TEE);  Surgeon: Mona Vinie BROCKS, MD;  Location: [facility] ENDOSCOPY;  Service: Cardiovascular;  Laterality: N/A;       Home Medications    Prior to Admission medications   Medication Sig Start Date End Date Taking? Authorizing Provider  acyclovir  (ZOVIRAX ) 400 MG tablet TAKE 1 TABLET BY MOUTH TWICE A DAY 01/05/24  Yes Federico Norleen ONEIDA MADISON, MD  ALPRAZolam  (XANAX ) 0.25 MG tablet Take 0.25 mg by mouth 2 (two) times daily as needed for anxiety.   Yes [name], MD  amiodarone  (PACERONE ) 200 MG tablet TAKE 1 TABLET BY MOUTH EVERY DAY 05/15/23  Yes Croitoru, Mihai, MD  apixaban  (ELIQUIS ) 5 MG TABS tablet Take 1 tablet (5 mg total) by mouth 2 (two) times daily. 07/18/22 07/16/24 Yes Croitoru, Mihai, MD  benzonatate  (TESSALON ) 100 MG capsule Take 1 capsule (100 mg total) by mouth every 8 (eight) hours. 04/06/24  Yes Dreama Longs, MD  dexamethasone  (DECADRON ) 4 MG tablet TAKE 10 TABLETS (40 MG TOTAL) BY MOUTH ONCE A WEEK IN THE MORNING ON CHEMOTHERAPY DAYS Patient taking differently: Take 40 mg by mouth every 14 (fourteen) days. Every other Monday 11/02/23  Yes Neomi Johnston ONEIDA, PA-C  ferrous gluconate  (FERGON) 324 MG tablet Take 1 tablet (324 mg total) by mouth 3 (three) times  daily with meals. 03/17/22  Yes Gherghe, Costin M, MD  gabapentin  (NEURONTIN ) 300 MG capsule TAKE 1 CAPSULE BY MOUTH TWICE A DAY 06/24/23  Yes Federico Norleen ONEIDA MADISON, MD  guaiFENesin  (MUCINEX ) 600 MG 12 hr tablet Take 600 mg by mouth 2 (two) times daily as needed for cough or to loosen phlegm.   Yes [provider]  levothyroxine  (SYNTHROID ) 125 MCG tablet Take 125 mcg by mouth daily before breakfast. Every day except on Mondays 10/14/21  Yes [provider]  metoprolol  succinate (TOPROL -XL) 25 MG 24 hr tablet Take 0.5 tablets (12.5 mg total) by mouth daily. Take with or immediately following a meal. 04/11/24  Yes Briana Elgin LABOR, MD  Olmesartan-amLODIPine -HCTZ  20-5-12.5 MG TABS Take 1 tablet by mouth daily. 02/14/23  Yes [provider]  rosuvastatin (CRESTOR) 20 MG tablet Take 20 mg by mouth daily. 11/21/22  Yes [provider]  senna-docusate (SENOKOT-S) 8.6-50 MG tablet Take 1 tablet by mouth at bedtime as needed for mild constipation. 11/26/21  Yes [provider]  traMADol  (ULTRAM ) 50 MG tablet Take 1 tablet (50 mg total) by mouth every 12 (twelve) hours as needed. 04/11/24  Yes Briana Elgin LABOR, MD  vitamin B-12 (CYANOCOBALAMIN ) 500 MCG tablet Take 500 mcg by mouth in the morning. 07/19/15  Yes [provider]  fluticasone (FLONASE) 50 MCG/ACT nasal spray Place 2 sprays into the nose daily as needed for allergies.    [provider]  loratadine (CLARITIN) 10 MG tablet Take 10 mg by mouth daily as needed for allergies. 01/04/20   [provider]  meclizine  (ANTIVERT ) 25 MG tablet Take 1 tablet (25 mg total) by mouth 3 (three) times daily as needed for dizziness. 03/20/13   Loreli Elyn SAILOR, MD    Family History Family History  Problem Relation Age of Onset   Kidney disease Mother    Prostate cancer Father    Kidney disease Brother    Colon cancer Neg Hx    Esophageal cancer Neg Hx    Liver cancer Neg Hx    Pancreatic cancer Neg Hx    Rectal cancer Neg Hx    Stomach cancer Neg Hx     Social History Social History   Tobacco Use   Smoking status: Former    Current packs/day: 0.00    Types: Cigarettes    Start date: 10/01/1958    Quit date: 10/01/1978    Years since quitting: 45.8   Smokeless tobacco: Never   Tobacco comments:    Former smoker 04/03/22  Vaping Use   Vaping status: Never Used  Substance Use Topics   Alcohol use: Not Currently    Alcohol/week: 2.0 standard drinks of alcohol    Types: 2 Glasses of wine per week    Comment: None in many years   Drug use: No     Allergies   Zithromax  [azithromycin ]   Review of Systems Review of Systems  Constitutional: Negative.    HENT:  Positive for sore throat.   Respiratory:  Positive for cough and wheezing.   Cardiovascular: Negative.   Gastrointestinal: Negative.   Musculoskeletal: Negative.   Psychiatric/Behavioral: Negative.       Physical Exam Triage Vital Signs ED Triage Vitals  Encounter Vitals Group     BP 07/16/24 1122 118/71     Girls Systolic BP Percentile --      Girls Diastolic BP Percentile --      Boys Systolic BP Percentile --  Boys Diastolic BP Percentile --      Pulse Rate 07/16/24 1122 (!) 53     Resp 07/16/24 1122 20     Temp 07/16/24 1122 98.7 F (37.1 C)     Temp src --      SpO2 07/16/24 1122 92 %     Weight --      Height --      Head Circumference --      Peak Flow --      Pain Score 07/16/24 1118 0     Pain Loc --      Pain Education --      Exclude from Growth Chart --    No data found.  Updated Vital Signs BP 118/71   Pulse (!) 53   Temp 98.7 F (37.1 C)   Resp 20   SpO2 92%   Visual Acuity Right Eye Distance:   Left Eye Distance:   Bilateral Distance:    Right Eye Near:   Left Eye Near:    Bilateral Near:     Physical Exam Constitutional:      General: He is not in acute distress.    Appearance: He is well-developed and normal weight. He is not ill-appearing or toxic-appearing.  HENT:     Nose: No congestion or rhinorrhea.     Mouth/Throat:     Mouth: Mucous membranes are moist.     Pharynx: No oropharyngeal exudate or posterior oropharyngeal erythema.     Tonsils: No tonsillar exudate.  Cardiovascular:     Rate and Rhythm: Normal rate and regular rhythm.     Heart sounds: Normal heart sounds.  Pulmonary:     Effort: Pulmonary effort is normal.     Breath sounds: Normal breath sounds. No wheezing, rhonchi or rales.  Musculoskeletal:     Cervical back: Normal range of motion and neck supple.  Lymphadenopathy:     Cervical: No cervical adenopathy.  Skin:    General: Skin is warm.  Neurological:     General: No focal deficit present.      Mental Status: He is alert.  Psychiatric:        Mood and Affect: Mood normal.      UC Treatments / Results  Labs (all labs ordered are listed, but only abnormal results are displayed) Labs Reviewed - No data to display  EKG   Radiology DG Chest 2 View Result Date: 07/16/2024 EXAM: 2 VIEW(S) XRAY OF THE CHEST 07/16/2024 12:18:20 PM COMPARISON: 04/06/2024 CLINICAL HISTORY: cough FINDINGS: LUNGS AND PLEURA: Persistent elevated right hemidiaphragm. Atelectasis or infiltrates in the lower lungs. No pulmonary edema. No pleural effusion. No pneumothorax. HEART AND MEDIASTINUM: No acute abnormality of the cardiac and mediastinal silhouettes. BONES AND SOFT TISSUES: Multilevel degenerative changes of thoracic spine. No acute osseous abnormality. IMPRESSION: 1. Atelectasis or infiltrates in the lower lungs. Electronically signed by: Norman Gatlin MD 07/16/2024 12:40 PM EST RP Workstation: [id number]    Procedures Procedures (including critical care time)  Medications Ordered in UC Medications - No data to display  Initial Impression / Assessment and Plan / UC Course  I have reviewed the triage vital signs and the nursing notes.  Pertinent labs & imaging results that were available during my care of the patient were reviewed by me and considered in my medical decision making (see chart for details).  Patient was seen today for URI symptoms, questionable pneumonia on xray.  Given his co-morbidities, allergies, mediations and kidney function I will treat  with doxy and vantin.    Final Clinical Impressions(s) / UC Diagnoses   Final diagnoses:  Acute cough  Pneumonia of both lungs due to infectious organism, unspecified part of lung     Discharge Instructions      You were seen today for cough.  Your xray shows possible pneumonia in the lower lobes.  I am treating you with an oral antibiotic for treatment.  You may continue over the counter medications as well.  Please return or  go to the ER if you are not improving or worsening despite treatment.     ED Prescriptions     Medication Sig Dispense Auth. Provider   doxycycline (VIBRAMYCIN) 100 MG capsule Take 1 capsule (100 mg total) by mouth 2 (two) times daily for 7 days. 14 capsule Mayleen Borrero, MD   cefpodoxime (VANTIN) 200 MG tablet Take 1 tablet (200 mg total) by mouth daily for 7 days. 7 tablet Darral Longs, MD      PDMP not reviewed this encounter.   Darral Longs, MD 07/16/24 1254

## 2024-07-16 NOTE — ED Triage Notes (Signed)
 PT has had a cough and sore throat since Tuesday . Pt reports the cough is making him tired. PT has tried Mucinex   and Tessalon  . Pt is also taking Chemo .

## 2024-07-16 NOTE — Discharge Instructions (Addendum)
 You were seen today for cough.  Your xray shows possible pneumonia in the lower lobes.  I am treating you with oral antibiotics for treatment.  You may continue over the counter medications as well.  Please return or go to the ER if you are not improving or worsening despite treatment.

## 2024-07-18 ENCOUNTER — Inpatient Hospital Stay

## 2024-07-18 ENCOUNTER — Inpatient Hospital Stay: Attending: Hematology and Oncology

## 2024-07-18 VITALS — BP 120/65 | HR 51 | Temp 97.7°F | Resp 18 | Wt 244.0 lb

## 2024-07-18 DIAGNOSIS — Z881 Allergy status to other antibiotic agents status: Secondary | ICD-10-CM | POA: Insufficient documentation

## 2024-07-18 DIAGNOSIS — Z8042 Family history of malignant neoplasm of prostate: Secondary | ICD-10-CM | POA: Insufficient documentation

## 2024-07-18 DIAGNOSIS — C9000 Multiple myeloma not having achieved remission: Secondary | ICD-10-CM | POA: Insufficient documentation

## 2024-07-18 DIAGNOSIS — Z860102 Personal history of hyperplastic colon polyps: Secondary | ICD-10-CM | POA: Insufficient documentation

## 2024-07-18 DIAGNOSIS — D649 Anemia, unspecified: Secondary | ICD-10-CM | POA: Insufficient documentation

## 2024-07-18 DIAGNOSIS — Z87891 Personal history of nicotine dependence: Secondary | ICD-10-CM | POA: Insufficient documentation

## 2024-07-18 DIAGNOSIS — G629 Polyneuropathy, unspecified: Secondary | ICD-10-CM | POA: Insufficient documentation

## 2024-07-18 DIAGNOSIS — N289 Disorder of kidney and ureter, unspecified: Secondary | ICD-10-CM | POA: Insufficient documentation

## 2024-07-18 DIAGNOSIS — Z5112 Encounter for antineoplastic immunotherapy: Secondary | ICD-10-CM | POA: Insufficient documentation

## 2024-07-18 DIAGNOSIS — J189 Pneumonia, unspecified organism: Secondary | ICD-10-CM | POA: Insufficient documentation

## 2024-07-18 DIAGNOSIS — Z7901 Long term (current) use of anticoagulants: Secondary | ICD-10-CM | POA: Insufficient documentation

## 2024-07-18 DIAGNOSIS — M19079 Primary osteoarthritis, unspecified ankle and foot: Secondary | ICD-10-CM | POA: Insufficient documentation

## 2024-07-18 DIAGNOSIS — Z8419 Family history of other disorders of kidney and ureter: Secondary | ICD-10-CM | POA: Insufficient documentation

## 2024-07-18 DIAGNOSIS — Z79899 Other long term (current) drug therapy: Secondary | ICD-10-CM | POA: Insufficient documentation

## 2024-07-18 LAB — CBC WITH DIFFERENTIAL (CANCER CENTER ONLY)
Abs Immature Granulocytes: 0.02 K/uL (ref 0.00–0.07)
Basophils Absolute: 0.1 K/uL (ref 0.0–0.1)
Basophils Relative: 1 %
Eosinophils Absolute: 0.4 K/uL (ref 0.0–0.5)
Eosinophils Relative: 7 %
HCT: 32.6 % — ABNORMAL LOW (ref 39.0–52.0)
Hemoglobin: 10.5 g/dL — ABNORMAL LOW (ref 13.0–17.0)
Immature Granulocytes: 0 %
Lymphocytes Relative: 22 %
Lymphs Abs: 1.3 K/uL (ref 0.7–4.0)
MCH: 31.7 pg (ref 26.0–34.0)
MCHC: 32.2 g/dL (ref 30.0–36.0)
MCV: 98.5 fL (ref 80.0–100.0)
Monocytes Absolute: 0.8 K/uL (ref 0.1–1.0)
Monocytes Relative: 14 %
Neutro Abs: 3.4 K/uL (ref 1.7–7.7)
Neutrophils Relative %: 56 %
Platelet Count: 244 K/uL (ref 150–400)
RBC: 3.31 MIL/uL — ABNORMAL LOW (ref 4.22–5.81)
RDW: 14.6 % (ref 11.5–15.5)
WBC Count: 6 K/uL (ref 4.0–10.5)
nRBC: 0 % (ref 0.0–0.2)

## 2024-07-18 LAB — CMP (CANCER CENTER ONLY)
ALT: 22 U/L (ref 0–44)
AST: 23 U/L (ref 15–41)
Albumin: 4 g/dL (ref 3.5–5.0)
Alkaline Phosphatase: 52 U/L (ref 38–126)
Anion gap: 8 (ref 5–15)
BUN: 24 mg/dL — ABNORMAL HIGH (ref 8–23)
CO2: 26 mmol/L (ref 22–32)
Calcium: 8.8 mg/dL — ABNORMAL LOW (ref 8.9–10.3)
Chloride: 107 mmol/L (ref 98–111)
Creatinine: 2.82 mg/dL — ABNORMAL HIGH (ref 0.61–1.24)
GFR, Estimated: 22 mL/min — ABNORMAL LOW (ref >60)
Glucose, Bld: 96 mg/dL (ref 70–99)
Potassium: 4 mmol/L (ref 3.5–5.1)
Sodium: 141 mmol/L (ref 135–145)
Total Bilirubin: 0.5 mg/dL (ref 0.0–1.2)
Total Protein: 6.6 g/dL (ref 6.5–8.1)

## 2024-07-18 MED ORDER — BORTEZOMIB CHEMO SQ INJECTION 3.5 MG (2.5MG/ML)
1.3000 mg/m2 | Freq: Once | INTRAMUSCULAR | Status: AC
  Administered 2024-07-18: 3 mg via SUBCUTANEOUS
  Filled 2024-07-18: qty 1.2

## 2024-07-18 MED ORDER — PROCHLORPERAZINE MALEATE 10 MG PO TABS
10.0000 mg | ORAL_TABLET | Freq: Once | ORAL | Status: AC
  Administered 2024-07-18: 10 mg via ORAL
  Filled 2024-07-18: qty 1

## 2024-07-18 NOTE — Patient Instructions (Signed)
 [facility] - A DEPT OF [facility]   Discharge Instructions: Thank you for choosing [facility] to provide your oncology and hematology care.   If you have a lab appointment with the Cancer Center, please go directly to the Cancer Center and check in at the registration area.   Wear comfortable clothing and clothing appropriate for easy access to any Portacath or PICC line.   We strive to give you quality time with your provider. You may need to reschedule your appointment if you arrive late (15 or more minutes).  Arriving late affects you and other patients whose appointments are after yours.  Also, if you miss three or more appointments without notifying the office, you may be dismissed from the clinic at the provider's discretion.      For prescription refill requests, have your pharmacy contact our office and allow 72 hours for refills to be completed.    Today you received the following chemotherapy and/or immunotherapy agents: Bortezomib (Velcade)      To help prevent nausea and vomiting after your treatment, we encourage you to take your nausea medication as directed.  BELOW ARE SYMPTOMS THAT SHOULD BE REPORTED IMMEDIATELY: *FEVER GREATER THAN 100.4 F (38 C) OR HIGHER *CHILLS OR SWEATING *NAUSEA AND VOMITING THAT IS NOT CONTROLLED WITH YOUR NAUSEA MEDICATION *UNUSUAL SHORTNESS OF BREATH *UNUSUAL BRUISING OR BLEEDING *URINARY PROBLEMS (pain or burning when urinating, or frequent urination) *BOWEL PROBLEMS (unusual diarrhea, constipation, pain near the anus) TENDERNESS IN MOUTH AND THROAT WITH OR WITHOUT PRESENCE OF ULCERS (sore throat, sores in mouth, or a toothache) UNUSUAL RASH, SWELLING OR PAIN  UNUSUAL VAGINAL DISCHARGE OR ITCHING   Items with * indicate a potential emergency and should be followed up as soon as possible or go to the Emergency Department if any problems should occur.  Please show the CHEMOTHERAPY ALERT CARD or  IMMUNOTHERAPY ALERT CARD at check-in to the Emergency Department and triage nurse.  Should you have questions after your visit or need to cancel or reschedule your appointment, please contact [facility] - A DEPT OF [facility]  Dept: [phone]  and follow the prompts.  Office hours are 8:00 a.m. to 4:30 p.m. Monday - Friday. Please note that voicemails left after 4:00 p.m. may not be returned until the following business day.  We are closed weekends and major holidays. You have access to a nurse at all times for urgent questions. Please call the main number to the clinic Dept: [phone] and follow the prompts.   For any non-urgent questions, you may also contact your provider using MyChart. We now offer e-Visits for anyone 75 and older to request care online for non-urgent symptoms. For details visit [email or web address].   Also download the MyChart app! Go to the app store, search "MyChart", open the app, select , and log in with your MyChart username and password.

## 2024-07-19 LAB — KAPPA/LAMBDA LIGHT CHAINS
Kappa free light chain: 22.1 mg/L — ABNORMAL HIGH (ref 3.3–19.4)
Kappa, lambda light chain ratio: 0.57 (ref 0.26–1.65)
Lambda free light chains: 38.8 mg/L — ABNORMAL HIGH (ref 5.7–26.3)

## 2024-07-20 ENCOUNTER — Encounter: Payer: Self-pay | Admitting: Hematology and Oncology

## 2024-07-22 LAB — MULTIPLE MYELOMA PANEL, SERUM
Albumin SerPl Elph-Mcnc: 3.4 g/dL (ref 2.9–4.4)
Albumin/Glob SerPl: 1.4 (ref 0.7–1.7)
Alpha 1: 0.3 g/dL (ref 0.0–0.4)
Alpha2 Glob SerPl Elph-Mcnc: 0.8 g/dL (ref 0.4–1.0)
B-Globulin SerPl Elph-Mcnc: 0.9 g/dL (ref 0.7–1.3)
Gamma Glob SerPl Elph-Mcnc: 0.5 g/dL (ref 0.4–1.8)
Globulin, Total: 2.5 g/dL (ref 2.2–3.9)
IgA: 135 mg/dL (ref 61–437)
IgG (Immunoglobin G), Serum: 558 mg/dL — ABNORMAL LOW (ref 603–1613)
IgM (Immunoglobulin M), Srm: 72 mg/dL (ref 15–143)
Total Protein ELP: 5.9 g/dL — ABNORMAL LOW (ref 6.0–8.5)

## 2024-07-26 ENCOUNTER — Ambulatory Visit: Attending: Cardiovascular Disease | Admitting: Cardiovascular Disease

## 2024-07-26 ENCOUNTER — Encounter: Payer: Self-pay | Admitting: Cardiovascular Disease

## 2024-07-26 VITALS — BP 120/64 | HR 57 | Ht 77.0 in | Wt 238.0 lb

## 2024-07-26 DIAGNOSIS — I10 Essential (primary) hypertension: Secondary | ICD-10-CM

## 2024-07-26 DIAGNOSIS — N184 Chronic kidney disease, stage 4 (severe): Secondary | ICD-10-CM

## 2024-07-26 DIAGNOSIS — E039 Hypothyroidism, unspecified: Secondary | ICD-10-CM

## 2024-07-26 DIAGNOSIS — C9000 Multiple myeloma not having achieved remission: Secondary | ICD-10-CM | POA: Diagnosis not present

## 2024-07-26 DIAGNOSIS — D6869 Other thrombophilia: Secondary | ICD-10-CM | POA: Diagnosis not present

## 2024-07-26 DIAGNOSIS — I4819 Other persistent atrial fibrillation: Secondary | ICD-10-CM

## 2024-07-26 DIAGNOSIS — Z79899 Other long term (current) drug therapy: Secondary | ICD-10-CM

## 2024-07-26 DIAGNOSIS — Z5181 Encounter for therapeutic drug level monitoring: Secondary | ICD-10-CM | POA: Diagnosis not present

## 2024-07-26 NOTE — Progress Notes (Signed)
 Cardiology Office Note:    Date:  07/26/2024   ID:  Darren Hinton., DOB 04/25/1946, MRN [id number]  PCP:  [name], MD   [facility] Providers Cardiologist:  None     Referring MD: [name], MD   No chief complaint on file.   History of Present Illness:    Darren Hinton. is a 78 y.o. male with  persistent atrial fibrillation, with early recurrence following cardioversion in July 2023, multiple myeloma (IgA lambda, Dr. Federico, currently maintenance Velcade  chemotherapy), hypertension returning in follow-up.  He had a respiratory infection last month and was treated for possible pneumonia.  His symptoms have improved.  He has had both his flu and COVID shots.  He has not had an RSV vaccine.  He has rare very brief, fleeting palpitations.  He has not had sustained palpitations in the last 12 months.  He does not have problems with lower extremity edema, orthopnea or PND.  He has not had falls or bleeding problems.  Has not had dizziness or syncope.  Chest pain has not been a complaint.  His last oncology clinic appointment with Dr. Federico was 07/04/2024 and he was doing well on Velcade  maintenance.  No change in his chronic complaints of lower extremity neuropathy.  He has liver function test checked every 2 weeks via the cancer center and these have been normal almost every single time (they were slightly elevated at roughly 2 times upper limit of normal in late October, simultaneously with his respiratory complaints, but then normalized on labs performed 07/18/2024).  He has not had a TSH checked since March.  He has now been on amiodarone  since November 2023.  He had depressed left ventricular systolic function and significant mitral insufficiency, both of which resolved with maintenance of normal rhythm.  He is an Designer, Television/film Set.  He has been able to get his Eliquis  from the [facility] hospital.  Past Medical History:  Diagnosis Date   Allergy     Anxiety    Arthritis    Depression    Heart murmur    Hyperlipidemia    Hyperplastic colon polyp    Hyperproteinemia    Hypertension    Hypothyroidism    Kidney disease    Mitral valve prolapse    Multiple myeloma (HCC)    Paroxysmal atrial fibrillation (HCC)    Thyroid disease     Past Surgical History:  Procedure Laterality Date   CARDIOVERSION N/A 03/14/2022   Procedure: CARDIOVERSION;  Surgeon: Mona Vinie BROCKS, MD;  Location: [facility] ENDOSCOPY;  Service: Cardiovascular;  Laterality: N/A;   TEE WITHOUT CARDIOVERSION N/A 03/14/2022   Procedure: TRANSESOPHAGEAL ECHOCARDIOGRAM (TEE);  Surgeon: Mona Vinie BROCKS, MD;  Location: [facility] ENDOSCOPY;  Service: Cardiovascular;  Laterality: N/A;    Current Medications: Current Meds  Medication Sig   acyclovir  (ZOVIRAX ) 400 MG tablet TAKE 1 TABLET BY MOUTH TWICE A DAY   ALPRAZolam  (XANAX ) 0.25 MG tablet Take 0.25 mg by mouth 2 (two) times daily as needed for anxiety.   amiodarone  (PACERONE ) 200 MG tablet TAKE 1 TABLET BY MOUTH EVERY DAY   apixaban  (ELIQUIS ) 5 MG TABS tablet Take 1 tablet (5 mg total) by mouth 2 (two) times daily.   ferrous gluconate  (FERGON) 324 MG tablet Take 1 tablet (324 mg total) by mouth 3 (three) times daily with meals.   gabapentin  (NEURONTIN ) 300 MG capsule TAKE 1 CAPSULE BY MOUTH TWICE A DAY   levothyroxine  (SYNTHROID ) 100 MCG  tablet Take 100 mcg by mouth daily before breakfast. EVERY DAY EXCEPT SUNDAYS   metoprolol  succinate (TOPROL -XL) 25 MG 24 hr tablet Take 0.5 tablets (12.5 mg total) by mouth daily. Take with or immediately following a meal.   Olmesartan-amLODIPine -HCTZ 20-5-12.5 MG TABS Take 1 tablet by mouth daily.   rosuvastatin (CRESTOR) 20 MG tablet Take 20 mg by mouth daily.   vitamin B-12 (CYANOCOBALAMIN ) 500 MCG tablet Take 500 mcg by mouth in the morning.     Allergies:   Zithromax  [azithromycin ]   Social History   Socioeconomic History   Marital status: Married    Spouse name: [name]   Number of  children: 2   Years of education: Not on file   Highest education level: Not on file  Occupational History   Occupation: retired  Tobacco Use   Smoking status: Former    Current packs/day: 0.00    Types: Cigarettes    Start date: 10/01/1958    Quit date: 10/01/1978    Years since quitting: 45.8   Smokeless tobacco: Never   Tobacco comments:    Former smoker 04/03/22  Vaping Use   Vaping status: Never Used  Substance and Sexual Activity   Alcohol use: Not Currently    Alcohol/week: 2.0 standard drinks of alcohol    Types: 2 Glasses of wine per week    Comment: None in many years   Drug use: No   Sexual activity: Not on file  Other Topics Concern   Not on file  Social History Narrative   Not on file   Social Drivers of Health   Financial Resource Strain: Not on file  Food Insecurity: No Food Insecurity (04/08/2024)   Hunger Vital Sign    Worried About Running Out of Food in the Last Year: Never true    Ran Out of Food in the Last Year: Never true  Transportation Needs: No Transportation Needs (04/08/2024)   PRAPARE - Administrator, Civil Service (Medical): No    Lack of Transportation (Non-Medical): No  Physical Activity: Not on file  Stress: Not on file  Social Connections: Socially Integrated (04/08/2024)   Social Connection and Isolation Panel    Frequency of Communication with Friends and Family: More than three times a week    Frequency of Social Gatherings with Friends and Family: Twice a week    Attends Religious Services: More than 4 times per year    Active Member of [employer] or Organizations: Yes    Attends Banker Meetings: 1 to 4 times per year    Marital Status: Married     Family History: The patient's family history includes Kidney disease in his brother and mother; Prostate cancer in his father. There is no history of Colon cancer, Esophageal cancer, Liver cancer, Pancreatic cancer, Rectal cancer, or Stomach cancer.  ROS:   Please  see the history of present illness.     All other systems reviewed and are negative.  EKGs/Labs/Other Studies Reviewed:    The following studies were reviewed today: Notes from Dr. Federico from yesterday  ECHO 12/17/2022   1. Left ventricular ejection fraction, by estimation, is 55 to 60%. Left  ventricular ejection fraction by 3D volume is 56 %. The left ventricle has  normal function. The left ventricle has no regional wall motion  abnormalities. Left ventricular diastolic   parameters were normal. The average left ventricular global longitudinal  strain is -25.3 %. The global longitudinal strain is normal.  2. Right ventricular systolic function is normal. The right ventricular  size is normal. There is normal pulmonary artery systolic pressure. The  estimated right ventricular systolic pressure is 34.6 mmHg.   3. Left atrial size was mildly dilated.   4. Right atrial size was mildly dilated.   5. The mitral valve is normal in structure. Trivial mitral valve  regurgitation.   6. Tricuspid valve regurgitation is mild to moderate.   7. The aortic valve is tricuspid. There is mild calcification of the  aortic valve. There is mild thickening of the aortic valve. Aortic valve  regurgitation is trivial. Aortic valve sclerosis/calcification is present,  without any evidence of aortic  stenosis.   8. The inferior vena cava is normal in size with greater than 50%  respiratory variability, suggesting right atrial pressure of 3 mmHg.   Comparison(s): Prior images reviewed side by side. The left ventricular  function has improved.   EKG:  EKG is ordered today and looks similar to previous tracings.  It shows mild sinus bradycardia with first-degree AV block (PR interval 218 ms), left axis deviation almost meeting criteria for left anterior fascicular block and nonspecific repolarization abnormalities.  Recent Labs: 07/18/2024: ALT 22; BUN 24; Creatinine 2.82; Hemoglobin 10.5; Platelet  Count 244; Potassium 4.0; Sodium 141  Recent Lipid Panel    Component Value Date/Time   CHOL  08/31/2008 0532    160        ATP III CLASSIFICATION:  <200     mg/dL   Desirable  799-760  mg/dL   Borderline High  >=759    mg/dL   High   TRIG 49 87/75/7990 0532   HDL 48 08/31/2008 0532   CHOLHDL 3.3 08/31/2008 0532   VLDL 10 08/31/2008 0532   LDLCALC (H) 08/31/2008 0532    102        Total Cholesterol/HDL:CHD Risk Coronary Heart Disease Risk Table                     Men   Women  1/2 Average Risk   3.4   3.3     Risk Assessment/Calculations:    CHA2DS2-VASc Score =     This indicates a  % annual risk of stroke. The patient's score is based upon:             Physical Exam:    VS:  BP 120/64 (BP Location: Left Arm, Patient Position: Sitting, Cuff Size: Large)   Pulse (!) 57   Ht 6' 5 (1.956 m)   Wt 238 lb (108 kg)   SpO2 92%   BMI 28.22 kg/m     Wt Readings from Last 3 Encounters:  07/26/24 238 lb (108 kg)  07/18/24 244 lb (110.7 kg)  07/04/24 239 lb 8 oz (108.6 kg)      General: Alert, oriented x3, no distress, appears well Head: no evidence of trauma, PERRL, EOMI, no exophtalmos or lid lag, no myxedema, no xanthelasma; normal ears, nose and oropharynx Neck: normal jugular venous pulsations and no hepatojugular reflux; brisk carotid pulses without delay and no carotid bruits Chest: clear to auscultation, no signs of consolidation by percussion or palpation, normal fremitus, symmetrical and full respiratory excursions Cardiovascular: normal position and quality of the apical impulse, regular rhythm, normal first and second heart sounds, no murmurs, rubs or gallops Abdomen: no tenderness or distention, no masses by palpation, no abnormal pulsatility or arterial bruits, normal bowel sounds, no hepatosplenomegaly Extremities: no clubbing, cyanosis or  edema; 2+ radial, ulnar and brachial pulses bilaterally; 2+ right femoral, posterior tibial and dorsalis pedis  pulses; 2+ left femoral, posterior tibial and dorsalis pedis pulses; no subclavian or femoral bruits Neurological: grossly nonfocal Psych: Normal mood and affect   ASSESSMENT:    1. Persistent atrial fibrillation (HCC)   2. Acquired thrombophilia   3. Encounter for monitoring amiodarone  therapy   4. Multiple myeloma not having achieved remission (HCC)   5. Essential hypertension   6. Chronic kidney disease, stage IV (severe) (HCC)   7. Acquired hypothyroidism       PLAN:    In order of problems listed above:  AFib: In sinus rhythm today.  No clinically relevant recurrences starting amiodarone , chronically anticoagulated..  CHA2DS2-VASc 4 (age 42, HTN, history of heart failure).  Tachycardia cardiomyopathy and mitral insufficiency resolved with arrhythmia control. Anticoagulation: No overt bleeding complications.  Stable hemoglobin with mild macrocytic anemia related to myeloma therapy.  Normal platelet count. Amiodarone : So far without any signs of complications.  Needs liver and thyroid function tests every 6 months, avoid excessive sun exposure, get a yearly ophthalmological exam, promptly report any unexplained respiratory symptoms, be aware of multiple potential drug interactions.  Normal liver function tests June 2024 Multiple myeloma on CyBorD chemotherapy: Doing very well on Velcade  maintenance therapy. HTN: Very well-controlled.  No change to his medicines today. CKD 3B: Creatinine has worsened over the last year and new baseline seems to be around 2.5-2.8, corresponding to GFR around 20-25.  Normal electrolytes Acquired hypothyroidism: On levothyroxine  supplement.  Appears euthyroid clinically.  Most recent TSH was checked 12/05/2023 and was normal.  Need to check every 6 months while on amiodarone , will add a TSH level to his upcoming labs at the cancer center..    Medication Adjustments/Labs and Tests Ordered: Current medicines are reviewed at length with the patient today.   Concerns regarding medicines are outlined above.  Orders Placed This Encounter  Procedures   TSH   No orders of the defined types were placed in this encounter.   Patient Instructions  Medication Instructions:  No changes *If you need a refill on your cardiac medications before your next appointment, please call your pharmacy*  Lab Work: TSH If you have labs (blood work) drawn today and your tests are completely normal, you will receive your results only by: MyChart Message (if you have MyChart) OR A paper copy in the mail If you have any lab test that is abnormal or we need to change your treatment, we will call you to review the results.  Testing/Procedures: None ordered  Follow-Up: At [facility], you and your health needs are our priority.  As part of our continuing mission to provide you with exceptional heart care, our providers are all part of one team.  This team includes your primary Cardiologist (physician) and Advanced Practice Providers or APPs (Physician Assistants and Nurse Practitioners) who all work together to provide you with the care you need, when you need it.  Your next appointment:   1 year(s)  Provider:   Dr Francyne  We recommend signing up for the patient portal called MyChart.  Sign up information is provided on this After Visit Summary.  MyChart is used to connect with patients for Virtual Visits (Telemedicine).  Patients are able to view lab/test results, encounter notes, upcoming appointments, etc.  Non-urgent messages can be sent to your provider as well.   To learn more about what you can do with MyChart, go to  [email or web address].      Signed, Jerel Balding, MD  07/26/2024 11:09 AM    [facility]

## 2024-07-26 NOTE — Patient Instructions (Signed)
 Medication Instructions:  No changes *If you need a refill on your cardiac medications before your next appointment, please call your pharmacy*  Lab Work: TSH If you have labs (blood work) drawn today and your tests are completely normal, you will receive your results only by: MyChart Message (if you have MyChart) OR A paper copy in the mail If you have any lab test that is abnormal or we need to change your treatment, we will call you to review the results.  Testing/Procedures: None ordered  Follow-Up: At [facility], you and your health needs are our priority.  As part of our continuing mission to provide you with exceptional heart care, our providers are all part of one team.  This team includes your primary Cardiologist (physician) and Advanced Practice Providers or APPs (Physician Assistants and Nurse Practitioners) who all work together to provide you with the care you need, when you need it.  Your next appointment:   1 year(s)  Provider:   Dr Francyne  We recommend signing up for the patient portal called MyChart.  Sign up information is provided on this After Visit Summary.  MyChart is used to connect with patients for Virtual Visits (Telemedicine).  Patients are able to view lab/test results, encounter notes, upcoming appointments, etc.  Non-urgent messages can be sent to your provider as well.   To learn more about what you can do with MyChart, go to [email or web address].

## 2024-07-29 ENCOUNTER — Other Ambulatory Visit: Payer: Self-pay

## 2024-07-31 NOTE — Progress Notes (Unsigned)
 [facility] [phone]   [phone]  PROGRESS NOTE  Patient Care Team: [name], MD as PCP - General (Internal Medicine)  Hematological/Oncological History # IgA Lambda Multiple Myeloma 05/03/2015: last visit with Dr. Sherrod at the [facility]. Was followed for IgA lambda MGUS. 12/11/2021: labs show M protein 3.4, Kappa 19.2, Lambda 1576.4, ratio 0.01. Cr 3.47, Hgb 8.6, WBC 5.7, MCV 99, Plt 221 12/23/2021: establish care with Dr. Federico  01/16/2022: Bone marrow biopsy performed, showed a 60% cellular bone marrow predominantly comprised of plasma cells making up 70 to 80%, lambda restricted.  Myeloma FISH panel showed no evidence of abnormalities. 02/07/2022: Cycle 1 Day 1 of CyBorD chemotherapy.  03/09/2022-03/17/2022: hospitalized for fever/pneumonia.  03/24/2022: Cycle 2 Day 1 of CyBorD chemotherapy.  04/22/2022: Cycle 3 Day 1 of CyBorD chemotherapy 05/19/2022-05/26/2022: Delayed start of Cycle 4 Day 1 of CyBorD chemotherapy due to neutropenia.  06/03/2022: Cycle 4 Day 1 of CyBorD chemotherapy. 25% dose reduction of cyclophosphamide  due to cytopenias. 06/30/2022: Cycle 4 Day 22. Drop Velcade  dose to 1mg /m2 and Cyclophosphamide  200 mg/m2  07/07/2022: Cycle 5 Day 1 of CyBorD chemotherapy with dose reductions.  08/04/2022: Cycle 6 Day 1 of CyBorD chemotherapy with dose reductions.  09/09/2022: transition to maintenance dose Velcade  q 2 weeks.   Interval History:  Darren Hinton. 78 y.o. male with medical history significant for IgA lambda multiple myeloma who presents for a follow up visit. The patient's last visit was on 07/04/2024. In the interim since the last visit he has continued on maintenance Velcade .   On exam today Darren Hinton reports he has been well overall with since our last visit.  He reports that he will be hosting Thanksgiving dinner and his wife and granddaughter as we doing most of the cooking.  He reports he has had flu and COVID  twice this year.  He reports he also recently had a pneumonia for which he has been on antibiotic therapy.  He still has a residual cough but reports that he mostly has this cough when horizontal.  He is also been having some issues with runny nose.  His cough has been dry and there is been no phlegm production.  He reports that he has his stable neuropathy in the feet with some issues of sensitivity.  Otherwise he has not been having any neuropathy of the hands.  He denies any issues with fevers, chills, sweats, nausea, vomiting or diarrhea.  He is not having any issues with the Velcade  shots and no redness, itching, rash at the site of injection.  A full 10 point ROS is otherwise negative.  He is willing and able to continue with Velcade  therapy at this time.  MEDICAL HISTORY:  Past Medical History:  Diagnosis Date   Allergy    Anxiety    Arthritis    Depression    Heart murmur    Hyperlipidemia    Hyperplastic colon polyp    Hyperproteinemia    Hypertension    Hypothyroidism    Kidney disease    Mitral valve prolapse    Multiple myeloma (HCC)    Paroxysmal atrial fibrillation (HCC)    Thyroid disease     SURGICAL HISTORY: Past Surgical History:  Procedure Laterality Date   CARDIOVERSION N/A 03/14/2022   Procedure: CARDIOVERSION;  Surgeon: Mona Vinie BROCKS, MD;  Location[facility] ENDOSCOPY;  Service: Cardiovascular;  Laterality: N/A;   TEE WITHOUT CARDIOVERSION N/A 03/14/2022   Procedure: TRANSESOPHAGEAL ECHOCARDIOGRAM (TEE);  Surgeon: Mona Vinie BROCKS, MD;  Location: [facility] ENDOSCOPY;  Service: Cardiovascular;  Laterality: N/A;    SOCIAL HISTORY: Social History   Socioeconomic History   Marital status: Married    Spouse name: [name]   Number of children: 2   Years of education: Not on file   Highest education level: Not on file  Occupational History   Occupation: retired  Tobacco Use   Smoking status: Former    Current packs/day: 0.00    Types: Cigarettes    Start date: 10/01/1958     Quit date: 10/01/1978    Years since quitting: 45.8   Smokeless tobacco: Never   Tobacco comments:    Former smoker 04/03/22  Vaping Use   Vaping status: Never Used  Substance and Sexual Activity   Alcohol use: Not Currently    Alcohol/week: 2.0 standard drinks of alcohol    Types: 2 Glasses of wine per week    Comment: None in many years   Drug use: No   Sexual activity: Not on file  Other Topics Concern   Not on file  Social History Narrative   Not on file   Social Drivers of Health   Financial Resource Strain: Not on file  Food Insecurity: No Food Insecurity (04/08/2024)   Hunger Vital Sign    Worried About Running Out of Food in the Last Year: Never true    Ran Out of Food in the Last Year: Never true  Transportation Needs: No Transportation Needs (04/08/2024)   PRAPARE - Administrator, Civil Service (Medical): No    Lack of Transportation (Non-Medical): No  Physical Activity: Not on file  Stress: Not on file  Social Connections: Socially Integrated (04/08/2024)   Social Connection and Isolation Panel    Frequency of Communication with Friends and Family: More than three times a week    Frequency of Social Gatherings with Friends and Family: Twice a week    Attends Religious Services: More than 4 times per year    Active Member of [employer] or Organizations: Yes    Attends Banker Meetings: 1 to 4 times per year    Marital Status: Married  Catering Manager Violence: Not At Risk (04/08/2024)   Humiliation, Afraid, Rape, and Kick questionnaire    Fear of Current or Ex-Partner: No    Emotionally Abused: No    Physically Abused: No    Sexually Abused: No    FAMILY HISTORY: Family History  Problem Relation Age of Onset   Kidney disease Mother    Prostate cancer Father    Kidney disease Brother    Colon cancer Neg Hx    Esophageal cancer Neg Hx    Liver cancer Neg Hx    Pancreatic cancer Neg Hx    Rectal cancer Neg Hx    Stomach cancer Neg Hx      ALLERGIES:  is allergic to zithromax  [azithromycin ].  MEDICATIONS:  Current Outpatient Medications  Medication Sig Dispense Refill   acyclovir  (ZOVIRAX ) 400 MG tablet TAKE 1 TABLET BY MOUTH TWICE A DAY 180 tablet 1   ALPRAZolam  (XANAX ) 0.25 MG tablet Take 0.25 mg by mouth 2 (two) times daily as needed for anxiety.     amiodarone  (PACERONE ) 200 MG tablet TAKE 1 TABLET BY MOUTH EVERY DAY 90 tablet 3   apixaban  (ELIQUIS ) 5 MG TABS tablet Take 1 tablet (5 mg total) by mouth 2 (two) times daily. 180 tablet 1   dexamethasone  (DECADRON ) 4 MG tablet  TAKE 10 TABLETS (40 MG TOTAL) BY MOUTH ONCE A WEEK IN THE MORNING ON CHEMOTHERAPY DAYS (Patient taking differently: Take 40 mg by mouth every 14 (fourteen) days. Every other Monday) 120 tablet 1   ferrous gluconate  (FERGON) 324 MG tablet Take 1 tablet (324 mg total) by mouth 3 (three) times daily with meals. 90 tablet 0   gabapentin  (NEURONTIN ) 300 MG capsule TAKE 1 CAPSULE BY MOUTH TWICE A DAY 60 capsule 1   guaiFENesin  (MUCINEX ) 600 MG 12 hr tablet Take 600 mg by mouth 2 (two) times daily as needed for cough or to loosen phlegm. (Patient not taking: Reported on 07/26/2024)     levothyroxine  (SYNTHROID ) 100 MCG tablet Take 100 mcg by mouth daily before breakfast. EVERY DAY EXCEPT SUNDAYS     meclizine  (ANTIVERT ) 25 MG tablet Take 1 tablet (25 mg total) by mouth 3 (three) times daily as needed for dizziness. (Patient not taking: Reported on 07/26/2024) 30 tablet 0   metoprolol  succinate (TOPROL -XL) 25 MG 24 hr tablet Take 0.5 tablets (12.5 mg total) by mouth daily. Take with or immediately following a meal.     Olmesartan-amLODIPine -HCTZ 20-5-12.5 MG TABS Take 1 tablet by mouth daily.     rosuvastatin (CRESTOR) 20 MG tablet Take 20 mg by mouth daily.     senna-docusate (SENOKOT-S) 8.6-50 MG tablet Take 1 tablet by mouth at bedtime as needed for mild constipation. (Patient not taking: Reported on 07/26/2024)     traMADol  (ULTRAM ) 50 MG tablet Take 1  tablet (50 mg total) by mouth every 12 (twelve) hours as needed. (Patient not taking: Reported on 07/26/2024)     vitamin B-12 (CYANOCOBALAMIN ) 500 MCG tablet Take 500 mcg by mouth in the morning.     No current facility-administered medications for this visit.    REVIEW OF SYSTEMS:   Constitutional: ( - ) fevers, ( - )  chills , ( - ) night sweats Eyes: ( - ) blurriness of vision, ( - ) double vision, ( - ) watery eyes Ears, nose, mouth, throat, and face: ( - ) mucositis, ( - ) sore throat Respiratory: ( - ) cough, ( - ) dyspnea, ( - ) wheezes Cardiovascular: ( - ) palpitation, ( - ) chest discomfort, (+ ) lower extremity swelling Gastrointestinal:  ( - ) nausea, ( - ) heartburn, ( - ) change in bowel habits Skin: ( - ) abnormal skin rashes Lymphatics: ( - ) new lymphadenopathy, ( - ) easy bruising Neurological: ( + ) numbness, ( - ) tingling, ( - ) new weaknesses Behavioral/Psych: ( - ) mood change, ( - ) new changes  All other systems were reviewed with the patient and are negative.  PHYSICAL EXAMINATION: ECOG PERFORMANCE STATUS: 1 - Symptomatic but completely ambulatory  Vitals:   08/01/24 1027  BP: 134/67  Pulse: (!) 51  Resp: 16  Temp: 97.6 F (36.4 C)  SpO2: 100%   Filed Weights   08/01/24 1027  Weight: 246 lb 4.8 oz (111.7 kg)    GENERAL: Chronically ill-appearing elderly African-American male, alert, no distress and comfortable SKIN: skin color, texture, turgor are normal, no rashes or significant lesions EYES: conjunctiva are pink and non-injected, sclera clear LUNGS: clear to auscultation and percussion with normal breathing effort HEART: regular rate & rhythm and no murmurs and +1 bilateral ankle edema, wearing compression stockings.  Musculoskeletal: no cyanosis of digits and no clubbing  PSYCH: alert & oriented x 3, fluent speech NEURO: no focal motor/sensory deficits  LABORATORY  DATA:  I have reviewed the data as listed    Latest Ref Rng & Units  08/01/2024    9:47 AM 07/18/2024    7:37 AM 07/04/2024    8:21 AM  CBC  WBC 4.0 - 10.5 K/uL 6.7  6.0  4.9   Hemoglobin 13.0 - 17.0 g/dL 89.4  89.4  88.8   Hematocrit 39.0 - 52.0 % 31.8  32.6  34.1   Platelets 150 - 400 K/uL 200  244  190        Latest Ref Rng & Units 08/01/2024    9:47 AM 07/18/2024    7:37 AM 07/04/2024    8:21 AM  CMP  Glucose 70 - 99 mg/dL 79  96  95   BUN 8 - 23 mg/dL 20  24  22    Creatinine 0.61 - 1.24 mg/dL 7.61  7.17  7.31   Sodium 135 - 145 mmol/L 141  141  141   Potassium 3.5 - 5.1 mmol/L 4.4  4.0  4.1   Chloride 98 - 111 mmol/L 107  107  108   CO2 22 - 32 mmol/L 23  26  25    Calcium 8.9 - 10.3 mg/dL 9.4  8.8  9.0   Total Protein 6.5 - 8.1 g/dL 6.8  6.6  6.7   Total Bilirubin 0.0 - 1.2 mg/dL 0.4  0.5  0.5   Alkaline Phos 38 - 126 U/L 62  52  54   AST 15 - 41 U/L 41  23  54   ALT 0 - 44 U/L 35  22  58     Lab Results  Component Value Date   MPROTEIN Not Observed 07/18/2024   MPROTEIN Not Observed 07/04/2024   MPROTEIN Not Observed 06/20/2024   Lab Results  Component Value Date   KPAFRELGTCHN 17.8 08/01/2024   KPAFRELGTCHN 22.1 (H) 07/18/2024   KPAFRELGTCHN 16.9 07/04/2024   LAMBDASER 36.1 (H) 08/01/2024   LAMBDASER 38.8 (H) 07/18/2024   LAMBDASER 32.4 (H) 07/04/2024   KAPLAMBRATIO 0.49 08/01/2024   KAPLAMBRATIO 0.57 07/18/2024   KAPLAMBRATIO 0.52 07/04/2024    RADIOGRAPHIC STUDIES: DG Chest 2 View Result Date: 07/16/2024 EXAM: 2 VIEW(S) XRAY OF THE CHEST 07/16/2024 12:18:20 PM COMPARISON: 04/06/2024 CLINICAL HISTORY: cough FINDINGS: LUNGS AND PLEURA: Persistent elevated right hemidiaphragm. Atelectasis or infiltrates in the lower lungs. No pulmonary edema. No pleural effusion. No pneumothorax. HEART AND MEDIASTINUM: No acute abnormality of the cardiac and mediastinal silhouettes. BONES AND SOFT TISSUES: Multilevel degenerative changes of thoracic spine. No acute osseous abnormality. IMPRESSION: 1. Atelectasis or infiltrates in the lower  lungs. Electronically signed by: Norman Gatlin MD 07/16/2024 12:40 PM EST RP Workstation: [id number]     ASSESSMENT & PLAN Darren Hinton. Is a 78 y.o. male with medical history significant for IgA lambda multiple myeloma who presents for a follow up visit. .    # IgA lambda Multiple Myeloma -- Bone marrow biopsy performed on 01/16/2022 showed increased plasma cells consistent with a plasma cell neoplasm.  Normal FISH panel. --Patient meets diagnostic criteria based on kidney dysfunction and anemia. --Cycle 1 Day 1 of CyBorD chemotherapy started on 02/07/2022 --Velcade  maintenance therapy started on 09/09/2022.  Plan: --Due for Cycle 49, Day 1 of maintenance Velcade  --Labs today show WBC 6.7, Hgb 10.5, MCV 99.7, Plt 200. Cr 2.82, Ca 8.8   --Most recent SPEP from 07/18/2024 showed M protein not detected and serum free light chains are normal. --continue on maintenance Velcade  every 2 weeks with  monthly clinic visits.  # Leg Pain -- Likely a component of neuropathy from his chemotherapy with pre-existing sciatica and radiculopathy pain. -- continue gabapentin  to 300 mg twice daily -- continue tramadol  50 mg every 6 hours as needed -- patient connected with Dr. Buckley  #Supportive Care -- chemotherapy education complete -- port placement not required.  -- zofran  8mg  q8H PRN and compazine  10mg  PO q6H for nausea -- acyclovir  400mg  PO BID for VCZ prophylaxis --zometa to start after dental clearance.   Orders Placed This Encounter  Procedures   TSH    Standing Status:   Future    Number of Occurrences:   1    Expiration Date:   08/01/2025   All questions were answered. The patient knows to call the clinic with any problems, questions or concerns.  I have spent a total of 30 minutes minutes of face-to-face and non-face-to-face time, preparing to see the patient, performing a medically appropriate examination, counseling and educating the patient, documenting clinical information in  the electronic health record,  and care coordination.   Norleen IVAR Kidney, MD Department of Hematology/Oncology [facility] at [facility] Phone: [phone] Pager: [phone] Email: [email or web address]  08/02/2024 3:50 PM

## 2024-08-01 ENCOUNTER — Inpatient Hospital Stay (HOSPITAL_BASED_OUTPATIENT_CLINIC_OR_DEPARTMENT_OTHER): Admitting: Hematology and Oncology

## 2024-08-01 ENCOUNTER — Inpatient Hospital Stay

## 2024-08-01 ENCOUNTER — Other Ambulatory Visit: Payer: Self-pay | Admitting: *Deleted

## 2024-08-01 VITALS — BP 134/67 | HR 51 | Temp 97.6°F | Resp 16 | Wt 246.3 lb

## 2024-08-01 DIAGNOSIS — C9000 Multiple myeloma not having achieved remission: Secondary | ICD-10-CM

## 2024-08-01 LAB — CBC WITH DIFFERENTIAL (CANCER CENTER ONLY)
Abs Immature Granulocytes: 0.02 K/uL (ref 0.00–0.07)
Basophils Absolute: 0 K/uL (ref 0.0–0.1)
Basophils Relative: 1 %
Eosinophils Absolute: 0.5 K/uL (ref 0.0–0.5)
Eosinophils Relative: 8 %
HCT: 31.8 % — ABNORMAL LOW (ref 39.0–52.0)
Hemoglobin: 10.5 g/dL — ABNORMAL LOW (ref 13.0–17.0)
Immature Granulocytes: 0 %
Lymphocytes Relative: 16 %
Lymphs Abs: 1.1 K/uL (ref 0.7–4.0)
MCH: 32.9 pg (ref 26.0–34.0)
MCHC: 33 g/dL (ref 30.0–36.0)
MCV: 99.7 fL (ref 80.0–100.0)
Monocytes Absolute: 1.2 K/uL — ABNORMAL HIGH (ref 0.1–1.0)
Monocytes Relative: 17 %
Neutro Abs: 3.9 K/uL (ref 1.7–7.7)
Neutrophils Relative %: 58 %
Platelet Count: 200 K/uL (ref 150–400)
RBC: 3.19 MIL/uL — ABNORMAL LOW (ref 4.22–5.81)
RDW: 14.7 % (ref 11.5–15.5)
WBC Count: 6.7 K/uL (ref 4.0–10.5)
nRBC: 0 % (ref 0.0–0.2)

## 2024-08-01 LAB — TSH: TSH: 0.576 u[IU]/mL (ref 0.350–4.500)

## 2024-08-01 LAB — CMP (CANCER CENTER ONLY)
ALT: 35 U/L (ref 0–44)
AST: 41 U/L (ref 15–41)
Albumin: 4.1 g/dL (ref 3.5–5.0)
Alkaline Phosphatase: 62 U/L (ref 38–126)
Anion gap: 10 (ref 5–15)
BUN: 20 mg/dL (ref 8–23)
CO2: 23 mmol/L (ref 22–32)
Calcium: 9.4 mg/dL (ref 8.9–10.3)
Chloride: 107 mmol/L (ref 98–111)
Creatinine: 2.38 mg/dL — ABNORMAL HIGH (ref 0.61–1.24)
GFR, Estimated: 27 mL/min — ABNORMAL LOW (ref >60)
Glucose, Bld: 79 mg/dL (ref 70–99)
Potassium: 4.4 mmol/L (ref 3.5–5.1)
Sodium: 141 mmol/L (ref 135–145)
Total Bilirubin: 0.4 mg/dL (ref 0.0–1.2)
Total Protein: 6.8 g/dL (ref 6.5–8.1)

## 2024-08-01 MED ORDER — BORTEZOMIB CHEMO SQ INJECTION 3.5 MG (2.5MG/ML)
1.3000 mg/m2 | Freq: Once | INTRAMUSCULAR | Status: AC
  Administered 2024-08-01: 3 mg via SUBCUTANEOUS
  Filled 2024-08-01: qty 1.2

## 2024-08-01 MED ORDER — PROCHLORPERAZINE MALEATE 10 MG PO TABS
10.0000 mg | ORAL_TABLET | Freq: Once | ORAL | Status: AC
  Administered 2024-08-01: 10 mg via ORAL
  Filled 2024-08-01: qty 1

## 2024-08-01 NOTE — Patient Instructions (Signed)
 [facility] - A DEPT OF . [facility]  Discharge Instructions: Thank you for choosing [facility] to provide your oncology and hematology care.   If you have a lab appointment with the Cancer Center, please go directly to the Cancer Center and check in at the registration area.   Wear comfortable clothing and clothing appropriate for easy access to any Portacath or PICC line.   We strive to give you quality time with your provider. You may need to reschedule your appointment if you arrive late (15 or more minutes).  Arriving late affects you and other patients whose appointments are after yours.  Also, if you miss three or more appointments without notifying the office, you may be dismissed from the clinic at the provider's discretion.      For prescription refill requests, have your pharmacy contact our office and allow 72 hours for refills to be completed.    Today you received the following chemotherapy and/or immunotherapy agents: bortezomib  SQ (VELCADE )    To help prevent nausea and vomiting after your treatment, we encourage you to take your nausea medication as directed.  BELOW ARE SYMPTOMS THAT SHOULD BE REPORTED IMMEDIATELY: *FEVER GREATER THAN 100.4 F (38 C) OR HIGHER *CHILLS OR SWEATING *NAUSEA AND VOMITING THAT IS NOT CONTROLLED WITH YOUR NAUSEA MEDICATION *UNUSUAL SHORTNESS OF BREATH *UNUSUAL BRUISING OR BLEEDING *URINARY PROBLEMS (pain or burning when urinating, or frequent urination) *BOWEL PROBLEMS (unusual diarrhea, constipation, pain near the anus) TENDERNESS IN MOUTH AND THROAT WITH OR WITHOUT PRESENCE OF ULCERS (sore throat, sores in mouth, or a toothache) UNUSUAL RASH, SWELLING OR PAIN  UNUSUAL VAGINAL DISCHARGE OR ITCHING   Items with * indicate a potential emergency and should be followed up as soon as possible or go to the Emergency Department if any problems should occur.  Please show the CHEMOTHERAPY ALERT CARD or  IMMUNOTHERAPY ALERT CARD at check-in to the Emergency Department and triage nurse.  Should you have questions after your visit or need to cancel or reschedule your appointment, please contact [facility] - A DEPT OF [facility]  Dept: [phone]  and follow the prompts.  Office hours are 8:00 a.m. to 4:30 p.m. Monday - Friday. Please note that voicemails left after 4:00 p.m. may not be returned until the following business day.  We are closed weekends and major holidays. You have access to a nurse at all times for urgent questions. Please call the main number to the clinic Dept: [phone] and follow the prompts.   For any non-urgent questions, you may also contact your provider using MyChart. We now offer e-Visits for anyone 61 and older to request care online for non-urgent symptoms. For details visit [email or web address].   Also download the MyChart app! Go to the app store, search "MyChart", open the app, select [facility], and log in with your MyChart username and password.

## 2024-08-02 ENCOUNTER — Encounter: Payer: Self-pay | Admitting: Hematology and Oncology

## 2024-08-02 LAB — KAPPA/LAMBDA LIGHT CHAINS
Kappa free light chain: 17.8 mg/L (ref 3.3–19.4)
Kappa, lambda light chain ratio: 0.49 (ref 0.26–1.65)
Lambda free light chains: 36.1 mg/L — ABNORMAL HIGH (ref 5.7–26.3)

## 2024-08-05 LAB — MULTIPLE MYELOMA PANEL, SERUM
Albumin SerPl Elph-Mcnc: 3.4 g/dL (ref 2.9–4.4)
Albumin/Glob SerPl: 1.3 (ref 0.7–1.7)
Alpha 1: 0.3 g/dL (ref 0.0–0.4)
Alpha2 Glob SerPl Elph-Mcnc: 0.8 g/dL (ref 0.4–1.0)
B-Globulin SerPl Elph-Mcnc: 1 g/dL (ref 0.7–1.3)
Gamma Glob SerPl Elph-Mcnc: 0.7 g/dL (ref 0.4–1.8)
Globulin, Total: 2.7 g/dL (ref 2.2–3.9)
IgA: 149 mg/dL (ref 61–437)
IgG (Immunoglobin G), Serum: 567 mg/dL — ABNORMAL LOW (ref 603–1613)
IgM (Immunoglobulin M), Srm: 64 mg/dL (ref 15–143)
Total Protein ELP: 6.1 g/dL (ref 6.0–8.5)

## 2024-08-15 ENCOUNTER — Inpatient Hospital Stay: Attending: Hematology and Oncology

## 2024-08-15 VITALS — BP 139/70 | HR 52 | Temp 98.4°F | Resp 16 | Wt 246.2 lb

## 2024-08-15 DIAGNOSIS — Z8042 Family history of malignant neoplasm of prostate: Secondary | ICD-10-CM | POA: Diagnosis not present

## 2024-08-15 DIAGNOSIS — C9000 Multiple myeloma not having achieved remission: Secondary | ICD-10-CM | POA: Insufficient documentation

## 2024-08-15 DIAGNOSIS — Z7901 Long term (current) use of anticoagulants: Secondary | ICD-10-CM | POA: Diagnosis not present

## 2024-08-15 DIAGNOSIS — Z8419 Family history of other disorders of kidney and ureter: Secondary | ICD-10-CM | POA: Insufficient documentation

## 2024-08-15 DIAGNOSIS — N289 Disorder of kidney and ureter, unspecified: Secondary | ICD-10-CM | POA: Diagnosis not present

## 2024-08-15 DIAGNOSIS — I480 Paroxysmal atrial fibrillation: Secondary | ICD-10-CM | POA: Insufficient documentation

## 2024-08-15 DIAGNOSIS — Z5112 Encounter for antineoplastic immunotherapy: Secondary | ICD-10-CM | POA: Diagnosis present

## 2024-08-15 DIAGNOSIS — Z7969 Long term (current) use of other immunomodulators and immunosuppressants: Secondary | ICD-10-CM | POA: Insufficient documentation

## 2024-08-15 DIAGNOSIS — Z79899 Other long term (current) drug therapy: Secondary | ICD-10-CM | POA: Insufficient documentation

## 2024-08-15 DIAGNOSIS — Z860102 Personal history of hyperplastic colon polyps: Secondary | ICD-10-CM | POA: Diagnosis not present

## 2024-08-15 DIAGNOSIS — Z881 Allergy status to other antibiotic agents status: Secondary | ICD-10-CM | POA: Diagnosis not present

## 2024-08-15 DIAGNOSIS — D649 Anemia, unspecified: Secondary | ICD-10-CM | POA: Diagnosis not present

## 2024-08-15 DIAGNOSIS — Z87891 Personal history of nicotine dependence: Secondary | ICD-10-CM | POA: Insufficient documentation

## 2024-08-15 DIAGNOSIS — Z79890 Hormone replacement therapy (postmenopausal): Secondary | ICD-10-CM | POA: Diagnosis not present

## 2024-08-15 LAB — CBC WITH DIFFERENTIAL (CANCER CENTER ONLY)
Abs Immature Granulocytes: 0.01 K/uL (ref 0.00–0.07)
Basophils Absolute: 0 K/uL (ref 0.0–0.1)
Basophils Relative: 1 %
Eosinophils Absolute: 0.4 K/uL (ref 0.0–0.5)
Eosinophils Relative: 6 %
HCT: 33.1 % — ABNORMAL LOW (ref 39.0–52.0)
Hemoglobin: 10.8 g/dL — ABNORMAL LOW (ref 13.0–17.0)
Immature Granulocytes: 0 %
Lymphocytes Relative: 13 %
Lymphs Abs: 0.9 K/uL (ref 0.7–4.0)
MCH: 32.5 pg (ref 26.0–34.0)
MCHC: 32.6 g/dL (ref 30.0–36.0)
MCV: 99.7 fL (ref 80.0–100.0)
Monocytes Absolute: 0.9 K/uL (ref 0.1–1.0)
Monocytes Relative: 13 %
Neutro Abs: 4.3 K/uL (ref 1.7–7.7)
Neutrophils Relative %: 67 %
Platelet Count: 170 K/uL (ref 150–400)
RBC: 3.32 MIL/uL — ABNORMAL LOW (ref 4.22–5.81)
RDW: 15.1 % (ref 11.5–15.5)
WBC Count: 6.5 K/uL (ref 4.0–10.5)
nRBC: 0 % (ref 0.0–0.2)

## 2024-08-15 LAB — CMP (CANCER CENTER ONLY)
ALT: 31 U/L (ref 0–44)
AST: 32 U/L (ref 15–41)
Albumin: 4.3 g/dL (ref 3.5–5.0)
Alkaline Phosphatase: 58 U/L (ref 38–126)
Anion gap: 11 (ref 5–15)
BUN: 15 mg/dL (ref 8–23)
CO2: 24 mmol/L (ref 22–32)
Calcium: 9.1 mg/dL (ref 8.9–10.3)
Chloride: 106 mmol/L (ref 98–111)
Creatinine: 2.15 mg/dL — ABNORMAL HIGH (ref 0.61–1.24)
GFR, Estimated: 31 mL/min — ABNORMAL LOW (ref >60)
Glucose, Bld: 90 mg/dL (ref 70–99)
Potassium: 4.5 mmol/L (ref 3.5–5.1)
Sodium: 141 mmol/L (ref 135–145)
Total Bilirubin: 0.5 mg/dL (ref 0.0–1.2)
Total Protein: 6.6 g/dL (ref 6.5–8.1)

## 2024-08-15 MED ORDER — PROCHLORPERAZINE MALEATE 10 MG PO TABS
10.0000 mg | ORAL_TABLET | Freq: Once | ORAL | Status: AC
  Administered 2024-08-15: 10 mg via ORAL
  Filled 2024-08-15: qty 1

## 2024-08-15 MED ORDER — BORTEZOMIB CHEMO SQ INJECTION 3.5 MG (2.5MG/ML)
1.3000 mg/m2 | Freq: Once | INTRAMUSCULAR | Status: AC
  Administered 2024-08-15: 3 mg via SUBCUTANEOUS
  Filled 2024-08-15: qty 1.2

## 2024-08-15 NOTE — Patient Instructions (Signed)
 [facility] - A DEPT OF [facility]   Discharge Instructions: Thank you for choosing [facility] to provide your oncology and hematology care.   If you have a lab appointment with the Cancer Center, please go directly to the Cancer Center and check in at the registration area.   Wear comfortable clothing and clothing appropriate for easy access to any Portacath or PICC line.   We strive to give you quality time with your provider. You may need to reschedule your appointment if you arrive late (15 or more minutes).  Arriving late affects you and other patients whose appointments are after yours.  Also, if you miss three or more appointments without notifying the office, you may be dismissed from the clinic at the provider's discretion.      For prescription refill requests, have your pharmacy contact our office and allow 72 hours for refills to be completed.    Today you received the following chemotherapy and/or immunotherapy agents: Bortezomib (Velcade)      To help prevent nausea and vomiting after your treatment, we encourage you to take your nausea medication as directed.  BELOW ARE SYMPTOMS THAT SHOULD BE REPORTED IMMEDIATELY: *FEVER GREATER THAN 100.4 F (38 C) OR HIGHER *CHILLS OR SWEATING *NAUSEA AND VOMITING THAT IS NOT CONTROLLED WITH YOUR NAUSEA MEDICATION *UNUSUAL SHORTNESS OF BREATH *UNUSUAL BRUISING OR BLEEDING *URINARY PROBLEMS (pain or burning when urinating, or frequent urination) *BOWEL PROBLEMS (unusual diarrhea, constipation, pain near the anus) TENDERNESS IN MOUTH AND THROAT WITH OR WITHOUT PRESENCE OF ULCERS (sore throat, sores in mouth, or a toothache) UNUSUAL RASH, SWELLING OR PAIN  UNUSUAL VAGINAL DISCHARGE OR ITCHING   Items with * indicate a potential emergency and should be followed up as soon as possible or go to the Emergency Department if any problems should occur.  Please show the CHEMOTHERAPY ALERT CARD or  IMMUNOTHERAPY ALERT CARD at check-in to the Emergency Department and triage nurse.  Should you have questions after your visit or need to cancel or reschedule your appointment, please contact [facility] - A DEPT OF [facility]  Dept: [phone]  and follow the prompts.  Office hours are 8:00 a.m. to 4:30 p.m. Monday - Friday. Please note that voicemails left after 4:00 p.m. may not be returned until the following business day.  We are closed weekends and major holidays. You have access to a nurse at all times for urgent questions. Please call the main number to the clinic Dept: [phone] and follow the prompts.   For any non-urgent questions, you may also contact your provider using MyChart. We now offer e-Visits for anyone 75 and older to request care online for non-urgent symptoms. For details visit [email or web address].   Also download the MyChart app! Go to the app store, search "MyChart", open the app, select , and log in with your MyChart username and password.

## 2024-08-16 LAB — KAPPA/LAMBDA LIGHT CHAINS
Kappa free light chain: 15.1 mg/L (ref 3.3–19.4)
Kappa, lambda light chain ratio: 0.47 (ref 0.26–1.65)
Lambda free light chains: 32.1 mg/L — ABNORMAL HIGH (ref 5.7–26.3)

## 2024-08-17 LAB — MULTIPLE MYELOMA PANEL, SERUM
Albumin SerPl Elph-Mcnc: 3.4 g/dL (ref 2.9–4.4)
Albumin/Glob SerPl: 1.4 (ref 0.7–1.7)
Alpha 1: 0.3 g/dL (ref 0.0–0.4)
Alpha2 Glob SerPl Elph-Mcnc: 0.8 g/dL (ref 0.4–1.0)
B-Globulin SerPl Elph-Mcnc: 0.9 g/dL (ref 0.7–1.3)
Gamma Glob SerPl Elph-Mcnc: 0.6 g/dL (ref 0.4–1.8)
Globulin, Total: 2.6 g/dL (ref 2.2–3.9)
IgA: 148 mg/dL (ref 61–437)
IgG (Immunoglobin G), Serum: 553 mg/dL — ABNORMAL LOW (ref 603–1613)
IgM (Immunoglobulin M), Srm: 62 mg/dL (ref 15–143)
Total Protein ELP: 6 g/dL (ref 6.0–8.5)

## 2024-08-18 ENCOUNTER — Telehealth: Payer: Self-pay | Admitting: Cardiovascular Disease

## 2024-08-18 MED ORDER — AMIODARONE HCL 200 MG PO TABS: 200.0000 mg | ORAL_TABLET | Freq: Every day | ORAL | 3 refills | Status: AC

## 2024-08-18 NOTE — Telephone Encounter (Signed)
°*  STAT* If patient is at the pharmacy, call can be transferred to refill team.   1. Which medications need to be refilled? (please list name of each medication and dose if known)   amiodarone  (PACERONE ) 200 MG tablet     2. Would you like to learn more about the convenience, safety, & potential cost savings by using the [facility]? No    3. Are you open to using the [facility] (Type [facility]. No    4. Which pharmacy/location (including street and city if local pharmacy) is medication to be sent to? [id number] - [place], [place] - [address]     5. Do they need a 30 day or 90 day supply? 90 day   Pt is out of medication.

## 2024-08-18 NOTE — Telephone Encounter (Signed)
 Refill sent

## 2024-08-28 NOTE — Progress Notes (Unsigned)
 " [facility] [phone]   [phone]  PROGRESS NOTE  Patient Care Team: [name], MD as PCP - General (Internal Medicine)  Hematological/Oncological History # IgA Lambda Multiple Myeloma 05/03/2015: last visit with Dr. Sherrod at the [facility]. Was followed for IgA lambda MGUS. 12/11/2021: labs show M protein 3.4, Kappa 19.2, Lambda 1576.4, ratio 0.01. Cr 3.47, Hgb 8.6, WBC 5.7, MCV 99, Plt 221 12/23/2021: establish care with Dr. Federico  01/16/2022: Bone marrow biopsy performed, showed a 60% cellular bone marrow predominantly comprised of plasma cells making up 70 to 80%, lambda restricted.  Myeloma FISH panel showed no evidence of abnormalities. 02/07/2022: Cycle 1 Day 1 of CyBorD chemotherapy.  03/09/2022-03/17/2022: hospitalized for fever/pneumonia.  03/24/2022: Cycle 2 Day 1 of CyBorD chemotherapy.  04/22/2022: Cycle 3 Day 1 of CyBorD chemotherapy 05/19/2022-05/26/2022: Delayed start of Cycle 4 Day 1 of CyBorD chemotherapy due to neutropenia.  06/03/2022: Cycle 4 Day 1 of CyBorD chemotherapy. 25% dose reduction of cyclophosphamide  due to cytopenias. 06/30/2022: Cycle 4 Day 22. Drop Velcade  dose to 1mg /m2 and Cyclophosphamide  200 mg/m2  07/07/2022: Cycle 5 Day 1 of CyBorD chemotherapy with dose reductions.  08/04/2022: Cycle 6 Day 1 of CyBorD chemotherapy with dose reductions.  09/09/2022: transition to maintenance dose Velcade  q 2 weeks.   Interval History:  Darren Hinton. 78 y.o. male with medical history significant for IgA lambda multiple myeloma who presents for a follow up visit. The patient's last visit was on 08/01/2024. In the interim since the last visit he has continued on maintenance Velcade .   On exam today Darren Hinton reports will be staying local for Christmas.  He reports he is excited his son is coming in from [place] and his other son lives in [place].  They will be gathering at his house.  He reports he does get melancholy at  this time a year as his brother passed away the day after Christmas.  He reports his energy levels are good and his appetite is strong.  He reports that he is trying to improve strength in his upper extremities but his walking is still not great.  His wife got him a stationary bike for something to try to strengthen his legs.  He is not having any trouble with fevers, chills, sweats, nausea, vomiting or diarrhea.  He reports that he does have some resolving congestion which is now clear but was previously yellow.  He has been on 2 pulses of steroids for this.  He reports is not having any nausea, vomiting, or diarrhea.  He is tolerating his Velcade  therapy well with no redness, itching or rash.  He does jump a little with the injection sometimes due to slight discomfort.  He reports he does have some occasional dizziness when standing up but overall he feels well.  He denies any fevers, chills, sweats, nausea, vomiting or diarrhea.  A full 10 point ROS is otherwise negative.  MEDICAL HISTORY:  Past Medical History:  Diagnosis Date   Allergy    Anxiety    Arthritis    Depression    Heart murmur    Hyperlipidemia    Hyperplastic colon polyp    Hyperproteinemia    Hypertension    Hypothyroidism    Kidney disease    Mitral valve prolapse    Multiple myeloma (HCC)    Paroxysmal atrial fibrillation (HCC)    Thyroid  disease     SURGICAL HISTORY: Past Surgical History:  Procedure Laterality Date  CARDIOVERSION N/A 03/14/2022   Procedure: CARDIOVERSION;  Surgeon: Mona Vinie BROCKS, MD;  Location: [facility] ENDOSCOPY;  Service: Cardiovascular;  Laterality: N/A;   TEE WITHOUT CARDIOVERSION N/A 03/14/2022   Procedure: TRANSESOPHAGEAL ECHOCARDIOGRAM (TEE);  Surgeon: Mona Vinie BROCKS, MD;  Location: [facility] ENDOSCOPY;  Service: Cardiovascular;  Laterality: N/A;    SOCIAL HISTORY: Social History   Socioeconomic History   Marital status: Married    Spouse name: [name]   Number of children: 2   Years of  education: Not on file   Highest education level: Not on file  Occupational History   Occupation: retired  Tobacco Use   Smoking status: Former    Current packs/day: 0.00    Types: Cigarettes    Start date: 10/01/1958    Quit date: 10/01/1978    Years since quitting: 45.9   Smokeless tobacco: Never   Tobacco comments:    Former smoker 04/03/22  Vaping Use   Vaping status: Never Used  Substance and Sexual Activity   Alcohol use: Not Currently    Alcohol/week: 2.0 standard drinks of alcohol    Types: 2 Glasses of wine per week    Comment: None in many years   Drug use: No   Sexual activity: Not on file  Other Topics Concern   Not on file  Social History Narrative   Not on file   Social Drivers of Health   Tobacco Use: Medium Risk (07/26/2024)   Patient History    Smoking Tobacco Use: Former    Smokeless Tobacco Use: Never    Passive Exposure: Not on Actuary Strain: Not on file  Food Insecurity: No Food Insecurity (04/08/2024)   Epic    Worried About Programme Researcher, Broadcasting/film/video in the Last Year: Never true    Ran Out of Food in the Last Year: Never true  Transportation Needs: No Transportation Needs (04/08/2024)   Epic    Lack of Transportation (Medical): No    Lack of Transportation (Non-Medical): No  Physical Activity: Not on file  Stress: Not on file  Social Connections: Socially Integrated (04/08/2024)   Social Connection and Isolation Panel    Frequency of Communication with Friends and Family: More than three times a week    Frequency of Social Gatherings with Friends and Family: Twice a week    Attends Religious Services: More than 4 times per year    Active Member of [employer] or Organizations: Yes    Attends Banker Meetings: 1 to 4 times per year    Marital Status: Married  Catering Manager Violence: Not At Risk (04/08/2024)   Epic    Fear of Current or Ex-Partner: No    Emotionally Abused: No    Physically Abused: No    Sexually Abused: No   Depression (PHQ2-9): Low Risk (06/20/2024)   Depression (PHQ2-9)    PHQ-2 Score: 0  Alcohol Screen: Not on file  Housing: Low Risk (04/08/2024)   Epic    Unable to Pay for Housing in the Last Year: No    Number of Times Moved in the Last Year: 0    Homeless in the Last Year: No  Utilities: Not At Risk (04/08/2024)   Epic    Threatened with loss of utilities: No  Health Literacy: Not on file    FAMILY HISTORY: Family History  Problem Relation Age of Onset   Kidney disease Mother    Prostate cancer Father    Kidney disease Brother  Colon cancer Neg Hx    Esophageal cancer Neg Hx    Liver cancer Neg Hx    Pancreatic cancer Neg Hx    Rectal cancer Neg Hx    Stomach cancer Neg Hx     ALLERGIES:  is allergic to zithromax  [azithromycin ].  MEDICATIONS:  Current Outpatient Medications  Medication Sig Dispense Refill   acyclovir  (ZOVIRAX ) 400 MG tablet TAKE 1 TABLET BY MOUTH TWICE A DAY 180 tablet 1   ALPRAZolam  (XANAX ) 0.25 MG tablet Take 0.25 mg by mouth 2 (two) times daily as needed for anxiety.     amiodarone  (PACERONE ) 200 MG tablet Take 1 tablet (200 mg total) by mouth daily. 90 tablet 3   apixaban  (ELIQUIS ) 5 MG TABS tablet Take 1 tablet (5 mg total) by mouth 2 (two) times daily. 180 tablet 1   dexamethasone  (DECADRON ) 4 MG tablet TAKE 10 TABLETS (40 MG TOTAL) BY MOUTH ONCE A WEEK IN THE MORNING ON CHEMOTHERAPY DAYS (Patient taking differently: Take 40 mg by mouth every 14 (fourteen) days. Every other Monday) 120 tablet 1   ferrous gluconate  (FERGON) 324 MG tablet Take 1 tablet (324 mg total) by mouth 3 (three) times daily with meals. 90 tablet 0   gabapentin  (NEURONTIN ) 300 MG capsule TAKE 1 CAPSULE BY MOUTH TWICE A DAY 60 capsule 1   guaiFENesin  (MUCINEX ) 600 MG 12 hr tablet Take 600 mg by mouth 2 (two) times daily as needed for cough or to loosen phlegm. (Patient not taking: Reported on 07/26/2024)     levothyroxine  (SYNTHROID ) 100 MCG tablet Take 100 mcg by mouth daily  before breakfast. EVERY DAY EXCEPT SUNDAYS     meclizine  (ANTIVERT ) 25 MG tablet Take 1 tablet (25 mg total) by mouth 3 (three) times daily as needed for dizziness. (Patient not taking: Reported on 07/26/2024) 30 tablet 0   metoprolol  succinate (TOPROL -XL) 25 MG 24 hr tablet Take 0.5 tablets (12.5 mg total) by mouth daily. Take with or immediately following a meal.     Olmesartan-amLODIPine -HCTZ 20-5-12.5 MG TABS Take 1 tablet by mouth daily.     rosuvastatin (CRESTOR) 20 MG tablet Take 20 mg by mouth daily.     senna-docusate (SENOKOT-S) 8.6-50 MG tablet Take 1 tablet by mouth at bedtime as needed for mild constipation. (Patient not taking: Reported on 07/26/2024)     traMADol  (ULTRAM ) 50 MG tablet Take 1 tablet (50 mg total) by mouth every 12 (twelve) hours as needed. (Patient not taking: Reported on 07/26/2024)     vitamin B-12 (CYANOCOBALAMIN ) 500 MCG tablet Take 500 mcg by mouth in the morning.     No current facility-administered medications for this visit.   Facility-Administered Medications Ordered in Other Visits  Medication Dose Route Frequency Provider Last Rate Last Admin   bortezomib  SQ (VELCADE ) chemo injection (2.5mg /mL concentration) 3 mg  1.3 mg/m2 (Treatment Plan Recorded) Subcutaneous Once Federico Norleen ONEIDA MADISON, MD        REVIEW OF SYSTEMS:   Constitutional: ( - ) fevers, ( - )  chills , ( - ) night sweats Eyes: ( - ) blurriness of vision, ( - ) double vision, ( - ) watery eyes Ears, nose, mouth, throat, and face: ( - ) mucositis, ( - ) sore throat Respiratory: ( - ) cough, ( - ) dyspnea, ( - ) wheezes Cardiovascular: ( - ) palpitation, ( - ) chest discomfort, (+ ) lower extremity swelling Gastrointestinal:  ( - ) nausea, ( - ) heartburn, ( - ) change  in bowel habits Skin: ( - ) abnormal skin rashes Lymphatics: ( - ) new lymphadenopathy, ( - ) easy bruising Neurological: ( + ) numbness, ( - ) tingling, ( - ) new weaknesses Behavioral/Psych: ( - ) mood change, ( - ) new  changes  All other systems were reviewed with the patient and are negative.  PHYSICAL EXAMINATION: ECOG PERFORMANCE STATUS: 1 - Symptomatic but completely ambulatory  Vitals:   08/29/24 0828  BP: 136/77  Pulse: 60  Resp: 16  Temp: 97.9 F (36.6 C)  SpO2: 100%    Filed Weights   08/29/24 0828  Weight: 243 lb (110.2 kg)     GENERAL: Chronically ill-appearing elderly African-American male, alert, no distress and comfortable SKIN: skin color, texture, turgor are normal, no rashes or significant lesions EYES: conjunctiva are pink and non-injected, sclera clear LUNGS: clear to auscultation and percussion with normal breathing effort HEART: regular rate & rhythm and no murmurs and +1 bilateral ankle edema, wearing compression stockings.  Musculoskeletal: no cyanosis of digits and no clubbing  PSYCH: alert & oriented x 3, fluent speech NEURO: no focal motor/sensory deficits  LABORATORY DATA:  I have reviewed the data as listed    Latest Ref Rng & Units 08/29/2024    8:02 AM 08/15/2024    8:21 AM 08/01/2024    9:47 AM  CBC  WBC 4.0 - 10.5 K/uL 7.4  6.5  6.7   Hemoglobin 13.0 - 17.0 g/dL 88.7  89.1  89.4   Hematocrit 39.0 - 52.0 % 34.6  33.1  31.8   Platelets 150 - 400 K/uL 261  170  200        Latest Ref Rng & Units 08/29/2024    8:02 AM 08/15/2024    8:21 AM 08/01/2024    9:47 AM  CMP  Glucose 70 - 99 mg/dL 875  90  79   BUN 8 - 23 mg/dL 31  15  20    Creatinine 0.61 - 1.24 mg/dL 7.45  7.84  7.61   Sodium 135 - 145 mmol/L 141  141  141   Potassium 3.5 - 5.1 mmol/L 4.8  4.5  4.4   Chloride 98 - 111 mmol/L 107  106  107   CO2 22 - 32 mmol/L 22  24  23    Calcium 8.9 - 10.3 mg/dL 9.1  9.1  9.4   Total Protein 6.5 - 8.1 g/dL 6.8  6.6  6.8   Total Bilirubin 0.0 - 1.2 mg/dL 0.4  0.5  0.4   Alkaline Phos 38 - 126 U/L 67  58  62   AST 15 - 41 U/L 28  32  41   ALT 0 - 44 U/L 33  31  35     Lab Results  Component Value Date   MPROTEIN Not Observed 08/15/2024   MPROTEIN  Not Observed 08/01/2024   MPROTEIN Not Observed 07/18/2024   Lab Results  Component Value Date   KPAFRELGTCHN 15.1 08/15/2024   KPAFRELGTCHN 17.8 08/01/2024   KPAFRELGTCHN 22.1 (H) 07/18/2024   LAMBDASER 32.1 (H) 08/15/2024   LAMBDASER 36.1 (H) 08/01/2024   LAMBDASER 38.8 (H) 07/18/2024   KAPLAMBRATIO 0.47 08/15/2024   KAPLAMBRATIO 0.49 08/01/2024   KAPLAMBRATIO 0.57 07/18/2024    RADIOGRAPHIC STUDIES: No results found.   ASSESSMENT & PLAN Darren Hinton. Is a 78 y.o. male with medical history significant for IgA lambda multiple myeloma who presents for a follow up visit. .    # IgA  lambda Multiple Myeloma -- Bone marrow biopsy performed on 01/16/2022 showed increased plasma cells consistent with a plasma cell neoplasm.  Normal FISH panel. --Patient meets diagnostic criteria based on kidney dysfunction and anemia. --Cycle 1 Day 1 of CyBorD chemotherapy started on 02/07/2022 --Velcade  maintenance therapy started on 09/09/2022.  Plan: --Due for Cycle 51, Day 1 of maintenance Velcade  --Labs today show WBC 7.4, Hgb 11.2, MCV 98.9, Plt 261   --Most recent SPEP from 07/18/2024 showed M protein not detected and serum free light chain ratio is normal. --continue on maintenance Velcade  every 2 weeks with monthly clinic visits.  # Leg Pain -- Likely a component of neuropathy from his chemotherapy with pre-existing sciatica and radiculopathy pain. -- continue gabapentin  to 300 mg twice daily -- continue tramadol  50 mg every 6 hours as needed -- patient connected with Dr. Buckley  #Supportive Care -- chemotherapy education complete -- port placement not required.  -- zofran  8mg  q8H PRN and compazine  10mg  PO q6H for nausea -- acyclovir  400mg  PO BID for VCZ prophylaxis --zometa to start after dental clearance.   No orders of the defined types were placed in this encounter.  All questions were answered. The patient knows to call the clinic with any problems, questions or  concerns.  I have spent a total of 30 minutes minutes of face-to-face and non-face-to-face time, preparing to see the patient, performing a medically appropriate examination, counseling and educating the patient, documenting clinical information in the electronic health record,  and care coordination.   Norleen IVAR Kidney, MD Department of Hematology/Oncology [facility] Phone: [phone] Pager: [phone] Email: [email or web address]  08/29/2024 9:31 AM "

## 2024-08-29 ENCOUNTER — Inpatient Hospital Stay: Admitting: Hematology and Oncology

## 2024-08-29 ENCOUNTER — Inpatient Hospital Stay

## 2024-08-29 ENCOUNTER — Other Ambulatory Visit: Payer: Self-pay

## 2024-08-29 VITALS — BP 136/77 | HR 60 | Temp 97.9°F | Resp 16 | Ht 77.0 in | Wt 243.0 lb

## 2024-08-29 DIAGNOSIS — C9000 Multiple myeloma not having achieved remission: Secondary | ICD-10-CM

## 2024-08-29 DIAGNOSIS — D631 Anemia in chronic kidney disease: Secondary | ICD-10-CM | POA: Diagnosis not present

## 2024-08-29 DIAGNOSIS — Z5112 Encounter for antineoplastic immunotherapy: Secondary | ICD-10-CM | POA: Diagnosis not present

## 2024-08-29 DIAGNOSIS — G620 Drug-induced polyneuropathy: Secondary | ICD-10-CM | POA: Diagnosis not present

## 2024-08-29 DIAGNOSIS — N184 Chronic kidney disease, stage 4 (severe): Secondary | ICD-10-CM

## 2024-08-29 LAB — CMP (CANCER CENTER ONLY)
ALT: 33 U/L (ref 0–44)
AST: 28 U/L (ref 15–41)
Albumin: 4.2 g/dL (ref 3.5–5.0)
Alkaline Phosphatase: 67 U/L (ref 38–126)
Anion gap: 11 (ref 5–15)
BUN: 31 mg/dL — ABNORMAL HIGH (ref 8–23)
CO2: 22 mmol/L (ref 22–32)
Calcium: 9.1 mg/dL (ref 8.9–10.3)
Chloride: 107 mmol/L (ref 98–111)
Creatinine: 2.54 mg/dL — ABNORMAL HIGH (ref 0.61–1.24)
GFR, Estimated: 25 mL/min — ABNORMAL LOW
Glucose, Bld: 124 mg/dL — ABNORMAL HIGH (ref 70–99)
Potassium: 4.8 mmol/L (ref 3.5–5.1)
Sodium: 141 mmol/L (ref 135–145)
Total Bilirubin: 0.4 mg/dL (ref 0.0–1.2)
Total Protein: 6.8 g/dL (ref 6.5–8.1)

## 2024-08-29 LAB — CBC WITH DIFFERENTIAL (CANCER CENTER ONLY)
Abs Immature Granulocytes: 0.07 K/uL (ref 0.00–0.07)
Basophils Absolute: 0 K/uL (ref 0.0–0.1)
Basophils Relative: 0 %
Eosinophils Absolute: 0 K/uL (ref 0.0–0.5)
Eosinophils Relative: 0 %
HCT: 34.6 % — ABNORMAL LOW (ref 39.0–52.0)
Hemoglobin: 11.2 g/dL — ABNORMAL LOW (ref 13.0–17.0)
Immature Granulocytes: 1 %
Lymphocytes Relative: 9 %
Lymphs Abs: 0.7 K/uL (ref 0.7–4.0)
MCH: 32 pg (ref 26.0–34.0)
MCHC: 32.4 g/dL (ref 30.0–36.0)
MCV: 98.9 fL (ref 80.0–100.0)
Monocytes Absolute: 0.4 K/uL (ref 0.1–1.0)
Monocytes Relative: 5 %
Neutro Abs: 6.3 K/uL (ref 1.7–7.7)
Neutrophils Relative %: 85 %
Platelet Count: 261 K/uL (ref 150–400)
RBC: 3.5 MIL/uL — ABNORMAL LOW (ref 4.22–5.81)
RDW: 14.9 % (ref 11.5–15.5)
WBC Count: 7.4 K/uL (ref 4.0–10.5)
nRBC: 0 % (ref 0.0–0.2)

## 2024-08-29 MED ADMIN — Prochlorperazine Maleate Tab 10 MG (Base Equivalent): 10 mg | ORAL | NDC 00904738206

## 2024-08-29 MED ADMIN — Bortezomib for Inj 3.5 MG: 3 mg | SUBCUTANEOUS | NDC 43598042660

## 2024-08-29 MED FILL — Bortezomib For Inj 3.5 MG: 1.3000 mg/m2 | INTRAMUSCULAR | Qty: 1.2 | Status: AC

## 2024-08-29 MED FILL — Prochlorperazine Maleate Tab 10 MG (Base Equivalent): 10.0000 mg | ORAL | Qty: 1 | Status: AC

## 2024-08-29 NOTE — Patient Instructions (Signed)
 [facility] - A DEPT OF [facility]   Discharge Instructions: Thank you for choosing [facility] to provide your oncology and hematology care.   If you have a lab appointment with the Cancer Center, please go directly to the Cancer Center and check in at the registration area.   Wear comfortable clothing and clothing appropriate for easy access to any Portacath or PICC line.   We strive to give you quality time with your provider. You may need to reschedule your appointment if you arrive late (15 or more minutes).  Arriving late affects you and other patients whose appointments are after yours.  Also, if you miss three or more appointments without notifying the office, you may be dismissed from the clinic at the provider's discretion.      For prescription refill requests, have your pharmacy contact our office and allow 72 hours for refills to be completed.    Today you received the following chemotherapy and/or immunotherapy agents: Bortezomib (Velcade)      To help prevent nausea and vomiting after your treatment, we encourage you to take your nausea medication as directed.  BELOW ARE SYMPTOMS THAT SHOULD BE REPORTED IMMEDIATELY: *FEVER GREATER THAN 100.4 F (38 C) OR HIGHER *CHILLS OR SWEATING *NAUSEA AND VOMITING THAT IS NOT CONTROLLED WITH YOUR NAUSEA MEDICATION *UNUSUAL SHORTNESS OF BREATH *UNUSUAL BRUISING OR BLEEDING *URINARY PROBLEMS (pain or burning when urinating, or frequent urination) *BOWEL PROBLEMS (unusual diarrhea, constipation, pain near the anus) TENDERNESS IN MOUTH AND THROAT WITH OR WITHOUT PRESENCE OF ULCERS (sore throat, sores in mouth, or a toothache) UNUSUAL RASH, SWELLING OR PAIN  UNUSUAL VAGINAL DISCHARGE OR ITCHING   Items with * indicate a potential emergency and should be followed up as soon as possible or go to the Emergency Department if any problems should occur.  Please show the CHEMOTHERAPY ALERT CARD or  IMMUNOTHERAPY ALERT CARD at check-in to the Emergency Department and triage nurse.  Should you have questions after your visit or need to cancel or reschedule your appointment, please contact [facility] - A DEPT OF [facility]  Dept: [phone]  and follow the prompts.  Office hours are 8:00 a.m. to 4:30 p.m. Monday - Friday. Please note that voicemails left after 4:00 p.m. may not be returned until the following business day.  We are closed weekends and major holidays. You have access to a nurse at all times for urgent questions. Please call the main number to the clinic Dept: [phone] and follow the prompts.   For any non-urgent questions, you may also contact your provider using MyChart. We now offer e-Visits for anyone 75 and older to request care online for non-urgent symptoms. For details visit [email or web address].   Also download the MyChart app! Go to the app store, search "MyChart", open the app, select , and log in with your MyChart username and password.

## 2024-08-30 LAB — KAPPA/LAMBDA LIGHT CHAINS
Kappa free light chain: 14.3 mg/L (ref 3.3–19.4)
Kappa, lambda light chain ratio: 0.49 (ref 0.26–1.65)
Lambda free light chains: 28.9 mg/L — ABNORMAL HIGH (ref 5.7–26.3)

## 2024-09-05 LAB — MULTIPLE MYELOMA PANEL, SERUM
Albumin SerPl Elph-Mcnc: 3.4 g/dL (ref 2.9–4.4)
Albumin/Glob SerPl: 1.3 (ref 0.7–1.7)
Alpha 1: 0.3 g/dL (ref 0.0–0.4)
Alpha2 Glob SerPl Elph-Mcnc: 0.8 g/dL (ref 0.4–1.0)
B-Globulin SerPl Elph-Mcnc: 0.9 g/dL (ref 0.7–1.3)
Gamma Glob SerPl Elph-Mcnc: 0.7 g/dL (ref 0.4–1.8)
Globulin, Total: 2.7 g/dL (ref 2.2–3.9)
IgA: 189 mg/dL (ref 61–437)
IgG (Immunoglobin G), Serum: 585 mg/dL — ABNORMAL LOW (ref 603–1613)
IgM (Immunoglobulin M), Srm: 88 mg/dL (ref 15–143)
Total Protein ELP: 6.1 g/dL (ref 6.0–8.5)

## 2024-09-06 ENCOUNTER — Other Ambulatory Visit: Payer: Self-pay

## 2024-09-09 ENCOUNTER — Other Ambulatory Visit: Payer: Self-pay

## 2024-09-12 ENCOUNTER — Inpatient Hospital Stay

## 2024-09-12 ENCOUNTER — Inpatient Hospital Stay: Attending: Hematology and Oncology

## 2024-09-12 VITALS — BP 118/66 | HR 56 | Temp 97.6°F | Resp 17 | Ht 77.0 in | Wt 239.8 lb

## 2024-09-12 DIAGNOSIS — Z79890 Hormone replacement therapy (postmenopausal): Secondary | ICD-10-CM | POA: Diagnosis not present

## 2024-09-12 DIAGNOSIS — C9000 Multiple myeloma not having achieved remission: Secondary | ICD-10-CM | POA: Insufficient documentation

## 2024-09-12 DIAGNOSIS — D649 Anemia, unspecified: Secondary | ICD-10-CM | POA: Insufficient documentation

## 2024-09-12 DIAGNOSIS — G629 Polyneuropathy, unspecified: Secondary | ICD-10-CM | POA: Insufficient documentation

## 2024-09-12 DIAGNOSIS — Z7901 Long term (current) use of anticoagulants: Secondary | ICD-10-CM | POA: Insufficient documentation

## 2024-09-12 DIAGNOSIS — Z79899 Other long term (current) drug therapy: Secondary | ICD-10-CM | POA: Diagnosis not present

## 2024-09-12 DIAGNOSIS — Z5112 Encounter for antineoplastic immunotherapy: Secondary | ICD-10-CM | POA: Diagnosis present

## 2024-09-12 DIAGNOSIS — Z8042 Family history of malignant neoplasm of prostate: Secondary | ICD-10-CM | POA: Diagnosis not present

## 2024-09-12 DIAGNOSIS — Z79624 Long term (current) use of inhibitors of nucleotide synthesis: Secondary | ICD-10-CM | POA: Diagnosis not present

## 2024-09-12 DIAGNOSIS — Z87891 Personal history of nicotine dependence: Secondary | ICD-10-CM | POA: Diagnosis not present

## 2024-09-12 DIAGNOSIS — Z881 Allergy status to other antibiotic agents status: Secondary | ICD-10-CM | POA: Diagnosis not present

## 2024-09-12 DIAGNOSIS — Z8419 Family history of other disorders of kidney and ureter: Secondary | ICD-10-CM | POA: Diagnosis not present

## 2024-09-12 DIAGNOSIS — Z860102 Personal history of hyperplastic colon polyps: Secondary | ICD-10-CM | POA: Diagnosis not present

## 2024-09-12 DIAGNOSIS — Z7969 Long term (current) use of other immunomodulators and immunosuppressants: Secondary | ICD-10-CM | POA: Diagnosis not present

## 2024-09-12 LAB — CBC WITH DIFFERENTIAL (CANCER CENTER ONLY)
Abs Immature Granulocytes: 0.01 K/uL (ref 0.00–0.07)
Basophils Absolute: 0 K/uL (ref 0.0–0.1)
Basophils Relative: 1 %
Eosinophils Absolute: 0.3 K/uL (ref 0.0–0.5)
Eosinophils Relative: 5 %
HCT: 32.2 % — ABNORMAL LOW (ref 39.0–52.0)
Hemoglobin: 10.5 g/dL — ABNORMAL LOW (ref 13.0–17.0)
Immature Granulocytes: 0 %
Lymphocytes Relative: 17 %
Lymphs Abs: 0.9 K/uL (ref 0.7–4.0)
MCH: 32.3 pg (ref 26.0–34.0)
MCHC: 32.6 g/dL (ref 30.0–36.0)
MCV: 99.1 fL (ref 80.0–100.0)
Monocytes Absolute: 0.8 K/uL (ref 0.1–1.0)
Monocytes Relative: 14 %
Neutro Abs: 3.4 K/uL (ref 1.7–7.7)
Neutrophils Relative %: 63 %
Platelet Count: 202 K/uL (ref 150–400)
RBC: 3.25 MIL/uL — ABNORMAL LOW (ref 4.22–5.81)
RDW: 14.6 % (ref 11.5–15.5)
WBC Count: 5.3 K/uL (ref 4.0–10.5)
nRBC: 0 % (ref 0.0–0.2)

## 2024-09-12 LAB — CMP (CANCER CENTER ONLY)
ALT: 28 U/L (ref 0–44)
AST: 33 U/L (ref 15–41)
Albumin: 4.1 g/dL (ref 3.5–5.0)
Alkaline Phosphatase: 59 U/L (ref 38–126)
Anion gap: 11 (ref 5–15)
BUN: 20 mg/dL (ref 8–23)
CO2: 24 mmol/L (ref 22–32)
Calcium: 9.4 mg/dL (ref 8.9–10.3)
Chloride: 105 mmol/L (ref 98–111)
Creatinine: 2.86 mg/dL — ABNORMAL HIGH (ref 0.61–1.24)
GFR, Estimated: 22 mL/min — ABNORMAL LOW
Glucose, Bld: 105 mg/dL — ABNORMAL HIGH (ref 70–99)
Potassium: 4.2 mmol/L (ref 3.5–5.1)
Sodium: 140 mmol/L (ref 135–145)
Total Bilirubin: 0.4 mg/dL (ref 0.0–1.2)
Total Protein: 6.8 g/dL (ref 6.5–8.1)

## 2024-09-12 MED ORDER — PROCHLORPERAZINE MALEATE 10 MG PO TABS
10.0000 mg | ORAL_TABLET | Freq: Once | ORAL | Status: AC
  Administered 2024-09-12: 10 mg via ORAL
  Filled 2024-09-12: qty 1

## 2024-09-12 MED ORDER — BORTEZOMIB CHEMO SQ INJECTION 3.5 MG (2.5MG/ML)
1.3000 mg/m2 | Freq: Once | INTRAMUSCULAR | Status: AC
  Administered 2024-09-12: 3 mg via SUBCUTANEOUS
  Filled 2024-09-12: qty 1.2

## 2024-09-12 NOTE — Patient Instructions (Signed)
 [facility] - A DEPT OF [facility] [place]  Discharge Instructions: Thank you for choosing [facility] to provide your oncology and hematology care.   If you have a lab appointment with the Cancer Center, please go directly to the Cancer Center and check in at the registration area.   Wear comfortable clothing and clothing appropriate for easy access to any Portacath or PICC line.   We strive to give you quality time with your provider. You may need to reschedule your appointment if you arrive late (15 or more minutes).  Arriving late affects you and other patients whose appointments are after yours.  Also, if you miss three or more appointments without notifying the office, you may be dismissed from the clinic at the provider's discretion.      For prescription refill requests, have your pharmacy contact our office and allow 72 hours for refills to be completed.    Today you received the following chemotherapy and/or immunotherapy agents: Velcade     To help prevent nausea and vomiting after your treatment, we encourage you to take your nausea medication as directed.  BELOW ARE SYMPTOMS THAT SHOULD BE REPORTED IMMEDIATELY: *FEVER GREATER THAN 100.4 F (38 C) OR HIGHER *CHILLS OR SWEATING *NAUSEA AND VOMITING THAT IS NOT CONTROLLED WITH YOUR NAUSEA MEDICATION *UNUSUAL SHORTNESS OF BREATH *UNUSUAL BRUISING OR BLEEDING *URINARY PROBLEMS (pain or burning when urinating, or frequent urination) *BOWEL PROBLEMS (unusual diarrhea, constipation, pain near the anus) TENDERNESS IN MOUTH AND THROAT WITH OR WITHOUT PRESENCE OF ULCERS (sore throat, sores in mouth, or a toothache) UNUSUAL RASH, SWELLING OR PAIN  UNUSUAL VAGINAL DISCHARGE OR ITCHING   Items with * indicate a potential emergency and should be followed up as soon as possible or go to the Emergency Department if any problems should occur.  Please show the CHEMOTHERAPY ALERT CARD or IMMUNOTHERAPY  ALERT CARD at check-in to the Emergency Department and triage nurse.  Should you have questions after your visit or need to cancel or reschedule your appointment, please contact [facility] - A [facility]  Dept: [phone]  and follow the prompts.  Office hours are 8:00 a.m. to 4:30 p.m. Monday - Friday. Please note that voicemails left after 4:00 p.m. may not be returned until the following business day.  We are closed weekends and major holidays. You have access to a nurse at all times for urgent questions. Please call the main number to the clinic Dept: [phone] and follow the prompts.   For any non-urgent questions, you may also contact your provider using MyChart. We now offer e-Visits for anyone 88 and older to request care online for non-urgent symptoms. For details visit [email or web address].   Also download the MyChart app! Go to the app store, search MyChart, open the app, select [place], and log in with your MyChart username and password.

## 2024-09-18 ENCOUNTER — Other Ambulatory Visit: Payer: Self-pay

## 2024-09-19 ENCOUNTER — Telehealth: Payer: Self-pay | Admitting: *Deleted

## 2024-09-19 ENCOUNTER — Encounter: Payer: Self-pay | Admitting: *Deleted

## 2024-09-19 NOTE — Telephone Encounter (Signed)
 Received vm message from pt requesting a call back. He stated he needs a letter for the government. Attempted call back. No answer but was able to leave a vm message requesting pt call back at his convenience to [phone]

## 2024-09-20 ENCOUNTER — Encounter: Payer: Self-pay | Admitting: *Deleted

## 2024-09-20 NOTE — Progress Notes (Signed)
 Letter signed by Dr. Federico w/information regarding past and current healthcare, copy in chart. Letter placed at [facility] reception desk as requested by patient for pick up 08/20/25.

## 2024-09-22 ENCOUNTER — Other Ambulatory Visit: Payer: Self-pay

## 2024-09-25 NOTE — Progress Notes (Unsigned)
 " [facility] [phone]   [phone]  PROGRESS NOTE  Patient Care Team: [name], MD as PCP - General (Internal Medicine)  Hematological/Oncological History # IgA Lambda Multiple Myeloma 05/03/2015: last visit with Dr. Sherrod at the [facility]. Was followed for IgA lambda MGUS. 12/11/2021: labs show M protein 3.4, Kappa 19.2, Lambda 1576.4, ratio 0.01. Cr 3.47, Hgb 8.6, WBC 5.7, MCV 99, Plt 221 12/23/2021: establish care with Dr. Federico  01/16/2022: Bone marrow biopsy performed, showed a 60% cellular bone marrow predominantly comprised of plasma cells making up 70 to 80%, lambda restricted.  Myeloma FISH panel showed no evidence of abnormalities. 02/07/2022: Cycle 1 Day 1 of CyBorD chemotherapy.  03/09/2022-03/17/2022: hospitalized for fever/pneumonia.  03/24/2022: Cycle 2 Day 1 of CyBorD chemotherapy.  04/22/2022: Cycle 3 Day 1 of CyBorD chemotherapy 05/19/2022-05/26/2022: Delayed start of Cycle 4 Day 1 of CyBorD chemotherapy due to neutropenia.  06/03/2022: Cycle 4 Day 1 of CyBorD chemotherapy. 25% dose reduction of cyclophosphamide  due to cytopenias. 06/30/2022: Cycle 4 Day 22. Drop Velcade  dose to 1mg /m2 and Cyclophosphamide  200 mg/m2  07/07/2022: Cycle 5 Day 1 of CyBorD chemotherapy with dose reductions.  08/04/2022: Cycle 6 Day 1 of CyBorD chemotherapy with dose reductions.  09/09/2022: transition to maintenance dose Velcade  q 2 weeks.   Interval History:  Darren Hinton. 79 y.o. male with medical history significant for IgA lambda multiple myeloma who presents for a follow up visit. The patient's last visit was on 08/29/2024. In the interim since the last visit he has continued on maintenance Velcade .   On exam today Darren Hinton reports he stayed in for most the weekend due to the cold weather.  He reports he has been feeling well and doing his best to wear a mask and avoid crowds.  He reports he has had some stumbles but no frank falls.  He  reports that he is dealing well with his numbness and neuropathy.  He reports he is not having any runny nose, sore throat, cough.  He denies any fevers, chills, sweats.  He reports his appetite and energy are both quite good today.  He reports that he has had no trouble with nausea, vomiting, or diarrhea.  He tolerates his Velcade  therapy well with no other major side effects.  He reports overall he is willing and able to proceed with Velcade  shots at this time.  Full 10 point ROS is otherwise negative.  MEDICAL HISTORY:  Past Medical History:  Diagnosis Date   Allergy    Anxiety    Arthritis    Depression    Heart murmur    Hyperlipidemia    Hyperplastic colon polyp    Hyperproteinemia    Hypertension    Hypothyroidism    Kidney disease    Mitral valve prolapse    Multiple myeloma (HCC)    Paroxysmal atrial fibrillation (HCC)    Thyroid  disease     SURGICAL HISTORY: Past Surgical History:  Procedure Laterality Date   CARDIOVERSION N/A 03/14/2022   Procedure: CARDIOVERSION;  Surgeon: Mona Vinie BROCKS, MD;  Location: [facility] ENDOSCOPY;  Service: Cardiovascular;  Laterality: N/A;   TEE WITHOUT CARDIOVERSION N/A 03/14/2022   Procedure: TRANSESOPHAGEAL ECHOCARDIOGRAM (TEE);  Surgeon: Mona Vinie BROCKS, MD;  Location: [facility] ENDOSCOPY;  Service: Cardiovascular;  Laterality: N/A;    SOCIAL HISTORY: Social History   Socioeconomic History   Marital status: Married    Spouse name: [name]   Number of children: 2   Years of  education: Not on file   Highest education level: Not on file  Occupational History   Occupation: retired  Tobacco Use   Smoking status: Former    Current packs/day: 0.00    Types: Cigarettes    Start date: 10/01/1958    Quit date: 10/01/1978    Years since quitting: 46.0   Smokeless tobacco: Never   Tobacco comments:    Former smoker 04/03/22  Vaping Use   Vaping status: Never Used  Substance and Sexual Activity   Alcohol use: Not Currently    Alcohol/week: 2.0  standard drinks of alcohol    Types: 2 Glasses of wine per week    Comment: None in many years   Drug use: No   Sexual activity: Not on file  Other Topics Concern   Not on file  Social History Narrative   Not on file   Social Drivers of Health   Tobacco Use: Medium Risk (07/26/2024)   Patient History    Smoking Tobacco Use: Former    Smokeless Tobacco Use: Never    Passive Exposure: Not on Actuary Strain: Not on file  Food Insecurity: No Food Insecurity (09/26/2024)   Epic    Worried About Programme Researcher, Broadcasting/film/video in the Last Year: Never true    Ran Out of Food in the Last Year: Never true  Transportation Needs: No Transportation Needs (04/08/2024)   Epic    Lack of Transportation (Medical): No    Lack of Transportation (Non-Medical): No  Physical Activity: Not on file  Stress: Not on file  Social Connections: Socially Integrated (04/08/2024)   Social Connection and Isolation Panel    Frequency of Communication with Friends and Family: More than three times a week    Frequency of Social Gatherings with Friends and Family: Twice a week    Attends Religious Services: More than 4 times per year    Active Member of [employer] or Organizations: Yes    Attends Banker Meetings: 1 to 4 times per year    Marital Status: Married  Catering Manager Violence: Not At Risk (09/26/2024)   Epic    Fear of Current or Ex-Partner: No    Emotionally Abused: No    Physically Abused: No    Sexually Abused: No  Depression (PHQ2-9): Low Risk (09/26/2024)   Depression (PHQ2-9)    PHQ-2 Score: 0  Alcohol Screen: Not on file  Housing: Unknown (09/26/2024)   Epic    Unable to Pay for Housing in the Last Year: No    Number of Times Moved in the Last Year: Not on file    Homeless in the Last Year: No  Utilities: Not At Risk (09/26/2024)   Epic    Threatened with loss of utilities: No  Health Literacy: Not on file    FAMILY HISTORY: Family History  Problem Relation Age of Onset    Kidney disease Mother    Prostate cancer Father    Kidney disease Brother    Colon cancer Neg Hx    Esophageal cancer Neg Hx    Liver cancer Neg Hx    Pancreatic cancer Neg Hx    Rectal cancer Neg Hx    Stomach cancer Neg Hx     ALLERGIES:  is allergic to zithromax  [azithromycin ].  MEDICATIONS:  Current Outpatient Medications  Medication Sig Dispense Refill   acyclovir  (ZOVIRAX ) 400 MG tablet TAKE 1 TABLET BY MOUTH TWICE A DAY 180 tablet 1   ALPRAZolam  (XANAX )  0.25 MG tablet Take 0.25 mg by mouth 2 (two) times daily as needed for anxiety.     amiodarone  (PACERONE ) 200 MG tablet Take 1 tablet (200 mg total) by mouth daily. 90 tablet 3   apixaban  (ELIQUIS ) 5 MG TABS tablet Take 1 tablet (5 mg total) by mouth 2 (two) times daily. 180 tablet 1   dexamethasone  (DECADRON ) 4 MG tablet TAKE 10 TABLETS (40 MG TOTAL) BY MOUTH ONCE A WEEK IN THE MORNING ON CHEMOTHERAPY DAYS 120 tablet 1   ferrous gluconate  (FERGON) 324 MG tablet Take 1 tablet (324 mg total) by mouth 3 (three) times daily with meals. 90 tablet 0   gabapentin  (NEURONTIN ) 300 MG capsule TAKE 1 CAPSULE BY MOUTH TWICE A DAY 60 capsule 1   guaiFENesin  (MUCINEX ) 600 MG 12 hr tablet Take 600 mg by mouth 2 (two) times daily as needed for cough or to loosen phlegm.     levothyroxine  (SYNTHROID ) 100 MCG tablet Take 100 mcg by mouth daily before breakfast. EVERY DAY EXCEPT SUNDAYS     meclizine  (ANTIVERT ) 25 MG tablet Take 1 tablet (25 mg total) by mouth 3 (three) times daily as needed for dizziness. 30 tablet 0   metoprolol  succinate (TOPROL -XL) 25 MG 24 hr tablet Take 0.5 tablets (12.5 mg total) by mouth daily. Take with or immediately following a meal.     Olmesartan-amLODIPine -HCTZ 20-5-12.5 MG TABS Take 1 tablet by mouth daily.     predniSONE (DELTASONE) 20 MG tablet Take 20 mg by mouth daily.     rosuvastatin (CRESTOR) 20 MG tablet Take 20 mg by mouth daily.     senna-docusate (SENOKOT-S) 8.6-50 MG tablet Take 1 tablet by mouth at  bedtime as needed for mild constipation.     traMADol  (ULTRAM ) 50 MG tablet Take 1 tablet (50 mg total) by mouth every 12 (twelve) hours as needed.     vitamin B-12 (CYANOCOBALAMIN ) 500 MCG tablet Take 500 mcg by mouth in the morning. (Patient not taking: Reported on 09/26/2024)     No current facility-administered medications for this visit.   Facility-Administered Medications Ordered in Other Visits  Medication Dose Route Frequency Provider Last Rate Last Admin   bortezomib  SQ (VELCADE ) chemo injection (2.5mg /mL concentration) 3 mg  1.3 mg/m2 (Treatment Plan Recorded) Subcutaneous Once Federico Norleen ONEIDA MADISON, MD        REVIEW OF SYSTEMS:   Constitutional: ( - ) fevers, ( - )  chills , ( - ) night sweats Eyes: ( - ) blurriness of vision, ( - ) double vision, ( - ) watery eyes Ears, nose, mouth, throat, and face: ( - ) mucositis, ( - ) sore throat Respiratory: ( - ) cough, ( - ) dyspnea, ( - ) wheezes Cardiovascular: ( - ) palpitation, ( - ) chest discomfort, (+ ) lower extremity swelling Gastrointestinal:  ( - ) nausea, ( - ) heartburn, ( - ) change in bowel habits Skin: ( - ) abnormal skin rashes Lymphatics: ( - ) new lymphadenopathy, ( - ) easy bruising Neurological: ( + ) numbness, ( - ) tingling, ( - ) new weaknesses Behavioral/Psych: ( - ) mood change, ( - ) new changes  All other systems were reviewed with the patient and are negative.  PHYSICAL EXAMINATION: ECOG PERFORMANCE STATUS: 1 - Symptomatic but completely ambulatory  Vitals:   09/26/24 0941  BP: 121/65  Pulse: (!) 58  Resp: 15  Temp: 98 F (36.7 C)  SpO2: 98%     Filed Weights  09/26/24 0941  Weight: 242 lb 14.4 oz (110.2 kg)      GENERAL: Chronically ill-appearing elderly African-American male, alert, no distress and comfortable SKIN: skin color, texture, turgor are normal, no rashes or significant lesions EYES: conjunctiva are pink and non-injected, sclera clear LUNGS: clear to auscultation and percussion  with normal breathing effort HEART: regular rate & rhythm and no murmurs and +1 bilateral ankle edema, wearing compression stockings.  Musculoskeletal: no cyanosis of digits and no clubbing  PSYCH: alert & oriented x 3, fluent speech NEURO: no focal motor/sensory deficits  LABORATORY DATA:  I have reviewed the data as listed    Latest Ref Rng & Units 09/26/2024    9:11 AM 09/12/2024    8:18 AM 08/29/2024    8:02 AM  CBC  WBC 4.0 - 10.5 K/uL 5.6  5.3  7.4   Hemoglobin 13.0 - 17.0 g/dL 89.4  89.4  88.7   Hematocrit 39.0 - 52.0 % 32.0  32.2  34.6   Platelets 150 - 400 K/uL 273  202  261        Latest Ref Rng & Units 09/26/2024    9:11 AM 09/12/2024    8:18 AM 08/29/2024    8:02 AM  CMP  Glucose 70 - 99 mg/dL 97  894  875   BUN 8 - 23 mg/dL 20  20  31    Creatinine 0.61 - 1.24 mg/dL 7.37  7.13  7.45   Sodium 135 - 145 mmol/L 141  140  141   Potassium 3.5 - 5.1 mmol/L 4.2  4.2  4.8   Chloride 98 - 111 mmol/L 106  105  107   CO2 22 - 32 mmol/L 23  24  22    Calcium 8.9 - 10.3 mg/dL 9.3  9.4  9.1   Total Protein 6.5 - 8.1 g/dL 7.0  6.8  6.8   Total Bilirubin 0.0 - 1.2 mg/dL 0.3  0.4  0.4   Alkaline Phos 38 - 126 U/L 66  59  67   AST 15 - 41 U/L 29  33  28   ALT 0 - 44 U/L 26  28  33     Lab Results  Component Value Date   MPROTEIN Not Observed 08/29/2024   MPROTEIN Not Observed 08/15/2024   MPROTEIN Not Observed 08/01/2024   Lab Results  Component Value Date   KPAFRELGTCHN 14.3 08/29/2024   KPAFRELGTCHN 15.1 08/15/2024   KPAFRELGTCHN 17.8 08/01/2024   LAMBDASER 28.9 (H) 08/29/2024   LAMBDASER 32.1 (H) 08/15/2024   LAMBDASER 36.1 (H) 08/01/2024   KAPLAMBRATIO 0.49 08/29/2024   KAPLAMBRATIO 0.47 08/15/2024   KAPLAMBRATIO 0.49 08/01/2024    RADIOGRAPHIC STUDIES: No results found.   ASSESSMENT & PLAN Darren Hinton. Is a 79 y.o. male with medical history significant for IgA lambda multiple myeloma who presents for a follow up visit. .    # IgA lambda Multiple  Myeloma -- Bone marrow biopsy performed on 01/16/2022 showed increased plasma cells consistent with a plasma cell neoplasm.  Normal FISH panel. --Patient meets diagnostic criteria based on kidney dysfunction and anemia. --Cycle 1 Day 1 of CyBorD chemotherapy started on 02/07/2022 --Velcade  maintenance therapy started on 09/09/2022.  Plan: --Due for Cycle 53, Day 1 of maintenance Velcade  --Labs today show WBC 5.6, Hgb 10.5, MCV 98.2, Plt 273. Cr 2.62, LFTs WNL.  --Most recent SPEP from 07/18/2024 showed M protein not detected and serum free light chain ratio is normal. --continue on maintenance Velcade   every 2 weeks with monthly clinic visits.  # Leg Pain -- Likely a component of neuropathy from his chemotherapy with pre-existing sciatica and radiculopathy pain. -- continue gabapentin  to 300 mg twice daily -- continue tramadol  50 mg every 6 hours as needed -- patient connected with Dr. Buckley  #Supportive Care -- chemotherapy education complete -- port placement not required.  -- zofran  8mg  q8H PRN and compazine  10mg  PO q6H for nausea -- acyclovir  400mg  PO BID for VCZ prophylaxis --zometa to start after dental clearance.   No orders of the defined types were placed in this encounter.  All questions were answered. The patient knows to call the clinic with any problems, questions or concerns.  I have spent a total of 30 minutes minutes of face-to-face and non-face-to-face time, preparing to see the patient, performing a medically appropriate examination, counseling and educating the patient, documenting clinical information in the electronic health record,  and care coordination.   Norleen IVAR Kidney, MD Department of Hematology/Oncology [facility] Phone: [phone] Pager: [phone] Email: [email or web address]  09/26/2024 10:52 AM "

## 2024-09-26 ENCOUNTER — Inpatient Hospital Stay

## 2024-09-26 ENCOUNTER — Encounter: Payer: Self-pay | Admitting: Hematology and Oncology

## 2024-09-26 ENCOUNTER — Inpatient Hospital Stay: Admitting: Hematology and Oncology

## 2024-09-26 VITALS — BP 121/65 | HR 58 | Temp 98.0°F | Resp 15 | Wt 242.9 lb

## 2024-09-26 DIAGNOSIS — G629 Polyneuropathy, unspecified: Secondary | ICD-10-CM | POA: Diagnosis not present

## 2024-09-26 DIAGNOSIS — C9000 Multiple myeloma not having achieved remission: Secondary | ICD-10-CM

## 2024-09-26 DIAGNOSIS — D631 Anemia in chronic kidney disease: Secondary | ICD-10-CM

## 2024-09-26 DIAGNOSIS — N184 Chronic kidney disease, stage 4 (severe): Secondary | ICD-10-CM | POA: Diagnosis not present

## 2024-09-26 DIAGNOSIS — Z5112 Encounter for antineoplastic immunotherapy: Secondary | ICD-10-CM | POA: Diagnosis not present

## 2024-09-26 LAB — CBC WITH DIFFERENTIAL (CANCER CENTER ONLY)
Abs Immature Granulocytes: 0.02 K/uL (ref 0.00–0.07)
Basophils Absolute: 0.1 K/uL (ref 0.0–0.1)
Basophils Relative: 1 %
Eosinophils Absolute: 0.3 K/uL (ref 0.0–0.5)
Eosinophils Relative: 5 %
HCT: 32 % — ABNORMAL LOW (ref 39.0–52.0)
Hemoglobin: 10.5 g/dL — ABNORMAL LOW (ref 13.0–17.0)
Immature Granulocytes: 0 %
Lymphocytes Relative: 19 %
Lymphs Abs: 1.1 K/uL (ref 0.7–4.0)
MCH: 32.2 pg (ref 26.0–34.0)
MCHC: 32.8 g/dL (ref 30.0–36.0)
MCV: 98.2 fL (ref 80.0–100.0)
Monocytes Absolute: 0.9 K/uL (ref 0.1–1.0)
Monocytes Relative: 17 %
Neutro Abs: 3.2 K/uL (ref 1.7–7.7)
Neutrophils Relative %: 58 %
Platelet Count: 273 K/uL (ref 150–400)
RBC: 3.26 MIL/uL — ABNORMAL LOW (ref 4.22–5.81)
RDW: 15.3 % (ref 11.5–15.5)
WBC Count: 5.6 K/uL (ref 4.0–10.5)
nRBC: 0 % (ref 0.0–0.2)

## 2024-09-26 LAB — CMP (CANCER CENTER ONLY)
ALT: 26 U/L (ref 0–44)
AST: 29 U/L (ref 15–41)
Albumin: 4.2 g/dL (ref 3.5–5.0)
Alkaline Phosphatase: 66 U/L (ref 38–126)
Anion gap: 12 (ref 5–15)
BUN: 20 mg/dL (ref 8–23)
CO2: 23 mmol/L (ref 22–32)
Calcium: 9.3 mg/dL (ref 8.9–10.3)
Chloride: 106 mmol/L (ref 98–111)
Creatinine: 2.62 mg/dL — ABNORMAL HIGH (ref 0.61–1.24)
GFR, Estimated: 24 mL/min — ABNORMAL LOW
Glucose, Bld: 97 mg/dL (ref 70–99)
Potassium: 4.2 mmol/L (ref 3.5–5.1)
Sodium: 141 mmol/L (ref 135–145)
Total Bilirubin: 0.3 mg/dL (ref 0.0–1.2)
Total Protein: 7 g/dL (ref 6.5–8.1)

## 2024-09-26 MED ORDER — PROCHLORPERAZINE MALEATE 10 MG PO TABS
10.0000 mg | ORAL_TABLET | Freq: Once | ORAL | Status: AC
  Administered 2024-09-26: 10 mg via ORAL
  Filled 2024-09-26: qty 1

## 2024-09-26 MED ORDER — BORTEZOMIB CHEMO SQ INJECTION 3.5 MG (2.5MG/ML)
1.3000 mg/m2 | Freq: Once | INTRAMUSCULAR | Status: AC
  Administered 2024-09-26: 3 mg via SUBCUTANEOUS
  Filled 2024-09-26: qty 1.2

## 2024-09-26 NOTE — Patient Instructions (Signed)
 [facility] - A DEPT OF [facility] [place]  Discharge Instructions: Thank you for choosing [facility] to provide your oncology and hematology care.   If you have a lab appointment with the Cancer Center, please go directly to the Cancer Center and check in at the registration area.   Wear comfortable clothing and clothing appropriate for easy access to any Portacath or PICC line.   We strive to give you quality time with your provider. You may need to reschedule your appointment if you arrive late (15 or more minutes).  Arriving late affects you and other patients whose appointments are after yours.  Also, if you miss three or more appointments without notifying the office, you may be dismissed from the clinic at the provider's discretion.      For prescription refill requests, have your pharmacy contact our office and allow 72 hours for refills to be completed.    Today you received the following chemotherapy and/or immunotherapy agents: Velcade     To help prevent nausea and vomiting after your treatment, we encourage you to take your nausea medication as directed.  BELOW ARE SYMPTOMS THAT SHOULD BE REPORTED IMMEDIATELY: *FEVER GREATER THAN 100.4 F (38 C) OR HIGHER *CHILLS OR SWEATING *NAUSEA AND VOMITING THAT IS NOT CONTROLLED WITH YOUR NAUSEA MEDICATION *UNUSUAL SHORTNESS OF BREATH *UNUSUAL BRUISING OR BLEEDING *URINARY PROBLEMS (pain or burning when urinating, or frequent urination) *BOWEL PROBLEMS (unusual diarrhea, constipation, pain near the anus) TENDERNESS IN MOUTH AND THROAT WITH OR WITHOUT PRESENCE OF ULCERS (sore throat, sores in mouth, or a toothache) UNUSUAL RASH, SWELLING OR PAIN  UNUSUAL VAGINAL DISCHARGE OR ITCHING   Items with * indicate a potential emergency and should be followed up as soon as possible or go to the Emergency Department if any problems should occur.  Please show the CHEMOTHERAPY ALERT CARD or IMMUNOTHERAPY  ALERT CARD at check-in to the Emergency Department and triage nurse.  Should you have questions after your visit or need to cancel or reschedule your appointment, please contact [facility] - A [facility]  Dept: [phone]  and follow the prompts.  Office hours are 8:00 a.m. to 4:30 p.m. Monday - Friday. Please note that voicemails left after 4:00 p.m. may not be returned until the following business day.  We are closed weekends and major holidays. You have access to a nurse at all times for urgent questions. Please call the main number to the clinic Dept: [phone] and follow the prompts.   For any non-urgent questions, you may also contact your provider using MyChart. We now offer e-Visits for anyone 88 and older to request care online for non-urgent symptoms. For details visit [email or web address].   Also download the MyChart app! Go to the app store, search MyChart, open the app, select [place], and log in with your MyChart username and password.

## 2024-09-27 ENCOUNTER — Other Ambulatory Visit: Payer: Self-pay

## 2024-09-27 LAB — KAPPA/LAMBDA LIGHT CHAINS
Kappa free light chain: 16.8 mg/L (ref 3.3–19.4)
Kappa, lambda light chain ratio: 0.36 (ref 0.26–1.65)
Lambda free light chains: 46.4 mg/L — ABNORMAL HIGH (ref 5.7–26.3)

## 2024-09-28 LAB — MULTIPLE MYELOMA PANEL, SERUM
Albumin SerPl Elph-Mcnc: 3.7 g/dL (ref 2.9–4.4)
Albumin/Glob SerPl: 1.5 (ref 0.7–1.7)
Alpha 1: 0.2 g/dL (ref 0.0–0.4)
Alpha2 Glob SerPl Elph-Mcnc: 0.8 g/dL (ref 0.4–1.0)
B-Globulin SerPl Elph-Mcnc: 0.9 g/dL (ref 0.7–1.3)
Gamma Glob SerPl Elph-Mcnc: 0.6 g/dL (ref 0.4–1.8)
Globulin, Total: 2.5 g/dL (ref 2.2–3.9)
IgA: 211 mg/dL (ref 61–437)
IgG (Immunoglobin G), Serum: 485 mg/dL — ABNORMAL LOW (ref 603–1613)
IgM (Immunoglobulin M), Srm: 64 mg/dL (ref 15–143)
M Protein SerPl Elph-Mcnc: 0.1 g/dL — ABNORMAL HIGH
Total Protein ELP: 6.2 g/dL (ref 6.0–8.5)

## 2024-10-07 ENCOUNTER — Telehealth: Payer: Self-pay | Admitting: Hematology and Oncology

## 2024-10-07 NOTE — Telephone Encounter (Signed)
 Pt called to reschedule monday treatment due to weather

## 2024-10-07 NOTE — Telephone Encounter (Signed)
 Called pt to inform that he will be contacted once appts are rescheduled

## 2024-10-10 ENCOUNTER — Inpatient Hospital Stay

## 2024-10-24 ENCOUNTER — Inpatient Hospital Stay

## 2024-10-24 ENCOUNTER — Inpatient Hospital Stay: Attending: Hematology and Oncology | Admitting: Hematology and Oncology

## 2024-11-07 ENCOUNTER — Inpatient Hospital Stay: Attending: Hematology and Oncology

## 2024-11-07 ENCOUNTER — Inpatient Hospital Stay

## 2024-11-21 ENCOUNTER — Inpatient Hospital Stay

## 2024-11-21 ENCOUNTER — Inpatient Hospital Stay: Admitting: Hematology and Oncology
